# Patient Record
Sex: Male | Born: 1935
Health system: Southern US, Community
[De-identification: ages and names within clinical notes are randomized; demographics above are authoritative.]

## PROBLEM LIST (undated history)

## (undated) DIAGNOSIS — IMO0001 Reserved for inherently not codable concepts without codable children: Secondary | ICD-10-CM

## (undated) DIAGNOSIS — M1990 Unspecified osteoarthritis, unspecified site: Secondary | ICD-10-CM

## (undated) DIAGNOSIS — I447 Left bundle-branch block, unspecified: Secondary | ICD-10-CM

## (undated) DIAGNOSIS — R011 Cardiac murmur, unspecified: Secondary | ICD-10-CM

## (undated) DIAGNOSIS — F419 Anxiety disorder, unspecified: Secondary | ICD-10-CM

## (undated) DIAGNOSIS — K219 Gastro-esophageal reflux disease without esophagitis: Secondary | ICD-10-CM

## (undated) DIAGNOSIS — I2510 Atherosclerotic heart disease of native coronary artery without angina pectoris: Secondary | ICD-10-CM

## (undated) DIAGNOSIS — T4145XA Adverse effect of unspecified anesthetic, initial encounter: Secondary | ICD-10-CM

## (undated) DIAGNOSIS — M109 Gout, unspecified: Secondary | ICD-10-CM

## (undated) DIAGNOSIS — J189 Pneumonia, unspecified organism: Secondary | ICD-10-CM

## (undated) DIAGNOSIS — N186 End stage renal disease: Secondary | ICD-10-CM

## (undated) DIAGNOSIS — T8859XA Other complications of anesthesia, initial encounter: Secondary | ICD-10-CM

## (undated) DIAGNOSIS — B029 Zoster without complications: Secondary | ICD-10-CM

## (undated) DIAGNOSIS — I10 Essential (primary) hypertension: Secondary | ICD-10-CM

## (undated) DIAGNOSIS — Z992 Dependence on renal dialysis: Secondary | ICD-10-CM

## (undated) DIAGNOSIS — I509 Heart failure, unspecified: Secondary | ICD-10-CM

## (undated) HISTORY — DX: Gout, unspecified: M10.9

## (undated) HISTORY — PX: DIALYSIS FISTULA CREATION: SHX611

## (undated) HISTORY — DX: Essential (primary) hypertension: I10

## (undated) HISTORY — DX: Gastro-esophageal reflux disease without esophagitis: K21.9

## (undated) HISTORY — DX: Zoster without complications: B02.9

## (undated) HISTORY — PX: AV FISTULA PLACEMENT: SHX1204

---
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
## 2008-06-04 DIAGNOSIS — B029 Zoster without complications: Secondary | ICD-10-CM | POA: Insufficient documentation

## 2011-01-05 HISTORY — PX: COLONOSCOPY: SHX174

## 2011-02-02 DIAGNOSIS — N189 Chronic kidney disease, unspecified: Secondary | ICD-10-CM | POA: Diagnosis not present

## 2011-05-11 DIAGNOSIS — K219 Gastro-esophageal reflux disease without esophagitis: Secondary | ICD-10-CM | POA: Diagnosis not present

## 2011-05-11 DIAGNOSIS — N289 Disorder of kidney and ureter, unspecified: Secondary | ICD-10-CM | POA: Diagnosis not present

## 2011-05-11 DIAGNOSIS — I10 Essential (primary) hypertension: Secondary | ICD-10-CM | POA: Diagnosis not present

## 2011-06-03 DIAGNOSIS — R972 Elevated prostate specific antigen [PSA]: Secondary | ICD-10-CM | POA: Diagnosis not present

## 2011-06-03 DIAGNOSIS — R351 Nocturia: Secondary | ICD-10-CM | POA: Diagnosis not present

## 2011-08-30 DIAGNOSIS — L2089 Other atopic dermatitis: Secondary | ICD-10-CM | POA: Diagnosis not present

## 2011-08-30 DIAGNOSIS — L821 Other seborrheic keratosis: Secondary | ICD-10-CM | POA: Diagnosis not present

## 2011-08-30 DIAGNOSIS — D239 Other benign neoplasm of skin, unspecified: Secondary | ICD-10-CM | POA: Diagnosis not present

## 2011-08-30 DIAGNOSIS — L723 Sebaceous cyst: Secondary | ICD-10-CM | POA: Diagnosis not present

## 2011-08-31 DIAGNOSIS — N183 Chronic kidney disease, stage 3 unspecified: Secondary | ICD-10-CM | POA: Diagnosis not present

## 2011-09-02 DIAGNOSIS — N183 Chronic kidney disease, stage 3 unspecified: Secondary | ICD-10-CM | POA: Diagnosis not present

## 2011-09-16 DIAGNOSIS — N183 Chronic kidney disease, stage 3 unspecified: Secondary | ICD-10-CM | POA: Diagnosis not present

## 2011-09-23 DIAGNOSIS — H35369 Drusen (degenerative) of macula, unspecified eye: Secondary | ICD-10-CM | POA: Diagnosis not present

## 2011-09-23 DIAGNOSIS — H2510 Age-related nuclear cataract, unspecified eye: Secondary | ICD-10-CM | POA: Diagnosis not present

## 2011-09-23 DIAGNOSIS — H524 Presbyopia: Secondary | ICD-10-CM | POA: Diagnosis not present

## 2011-09-29 DIAGNOSIS — N184 Chronic kidney disease, stage 4 (severe): Secondary | ICD-10-CM | POA: Diagnosis not present

## 2011-09-30 DIAGNOSIS — N183 Chronic kidney disease, stage 3 unspecified: Secondary | ICD-10-CM | POA: Diagnosis not present

## 2011-10-04 DIAGNOSIS — Z23 Encounter for immunization: Secondary | ICD-10-CM | POA: Diagnosis not present

## 2011-10-06 DIAGNOSIS — I120 Hypertensive chronic kidney disease with stage 5 chronic kidney disease or end stage renal disease: Secondary | ICD-10-CM | POA: Diagnosis not present

## 2011-10-06 DIAGNOSIS — I498 Other specified cardiac arrhythmias: Secondary | ICD-10-CM | POA: Diagnosis not present

## 2011-10-06 DIAGNOSIS — N186 End stage renal disease: Secondary | ICD-10-CM | POA: Diagnosis not present

## 2011-10-06 DIAGNOSIS — Z01812 Encounter for preprocedural laboratory examination: Secondary | ICD-10-CM | POA: Diagnosis not present

## 2011-10-06 DIAGNOSIS — I459 Conduction disorder, unspecified: Secondary | ICD-10-CM | POA: Diagnosis not present

## 2011-10-06 DIAGNOSIS — Z01810 Encounter for preprocedural cardiovascular examination: Secondary | ICD-10-CM | POA: Diagnosis not present

## 2011-10-07 DIAGNOSIS — N183 Chronic kidney disease, stage 3 unspecified: Secondary | ICD-10-CM | POA: Diagnosis not present

## 2011-10-15 DIAGNOSIS — K219 Gastro-esophageal reflux disease without esophagitis: Secondary | ICD-10-CM | POA: Diagnosis not present

## 2011-10-15 DIAGNOSIS — I120 Hypertensive chronic kidney disease with stage 5 chronic kidney disease or end stage renal disease: Secondary | ICD-10-CM | POA: Diagnosis not present

## 2011-10-15 DIAGNOSIS — N189 Chronic kidney disease, unspecified: Secondary | ICD-10-CM | POA: Diagnosis not present

## 2011-10-15 DIAGNOSIS — M109 Gout, unspecified: Secondary | ICD-10-CM | POA: Diagnosis not present

## 2011-10-15 DIAGNOSIS — N186 End stage renal disease: Secondary | ICD-10-CM | POA: Diagnosis not present

## 2011-10-15 DIAGNOSIS — Z79899 Other long term (current) drug therapy: Secondary | ICD-10-CM | POA: Diagnosis not present

## 2011-10-15 DIAGNOSIS — Z7982 Long term (current) use of aspirin: Secondary | ICD-10-CM | POA: Diagnosis not present

## 2011-10-27 DIAGNOSIS — N184 Chronic kidney disease, stage 4 (severe): Secondary | ICD-10-CM | POA: Diagnosis not present

## 2011-10-27 DIAGNOSIS — N186 End stage renal disease: Secondary | ICD-10-CM | POA: Diagnosis not present

## 2011-11-01 DIAGNOSIS — N183 Chronic kidney disease, stage 3 unspecified: Secondary | ICD-10-CM | POA: Diagnosis not present

## 2011-12-13 DIAGNOSIS — R351 Nocturia: Secondary | ICD-10-CM | POA: Diagnosis not present

## 2011-12-13 DIAGNOSIS — N179 Acute kidney failure, unspecified: Secondary | ICD-10-CM | POA: Diagnosis not present

## 2011-12-13 DIAGNOSIS — R3129 Other microscopic hematuria: Secondary | ICD-10-CM | POA: Diagnosis not present

## 2011-12-13 DIAGNOSIS — N184 Chronic kidney disease, stage 4 (severe): Secondary | ICD-10-CM | POA: Diagnosis not present

## 2011-12-13 DIAGNOSIS — R972 Elevated prostate specific antigen [PSA]: Secondary | ICD-10-CM | POA: Diagnosis not present

## 2011-12-13 DIAGNOSIS — N529 Male erectile dysfunction, unspecified: Secondary | ICD-10-CM | POA: Diagnosis not present

## 2011-12-15 DIAGNOSIS — N289 Disorder of kidney and ureter, unspecified: Secondary | ICD-10-CM | POA: Diagnosis not present

## 2011-12-15 DIAGNOSIS — E7800 Pure hypercholesterolemia, unspecified: Secondary | ICD-10-CM | POA: Diagnosis not present

## 2011-12-15 DIAGNOSIS — Z23 Encounter for immunization: Secondary | ICD-10-CM | POA: Diagnosis not present

## 2011-12-15 DIAGNOSIS — I10 Essential (primary) hypertension: Secondary | ICD-10-CM | POA: Diagnosis not present

## 2011-12-17 DIAGNOSIS — N179 Acute kidney failure, unspecified: Secondary | ICD-10-CM | POA: Diagnosis not present

## 2011-12-17 DIAGNOSIS — N183 Chronic kidney disease, stage 3 unspecified: Secondary | ICD-10-CM | POA: Diagnosis not present

## 2011-12-20 DIAGNOSIS — E785 Hyperlipidemia, unspecified: Secondary | ICD-10-CM | POA: Diagnosis not present

## 2011-12-20 DIAGNOSIS — I10 Essential (primary) hypertension: Secondary | ICD-10-CM | POA: Diagnosis not present

## 2011-12-20 DIAGNOSIS — N184 Chronic kidney disease, stage 4 (severe): Secondary | ICD-10-CM | POA: Diagnosis not present

## 2011-12-31 DIAGNOSIS — H2510 Age-related nuclear cataract, unspecified eye: Secondary | ICD-10-CM | POA: Diagnosis not present

## 2012-01-05 HISTORY — PX: CATARACT EXTRACTION W/ INTRAOCULAR LENS IMPLANT: SHX1309

## 2012-01-13 DIAGNOSIS — H2510 Age-related nuclear cataract, unspecified eye: Secondary | ICD-10-CM | POA: Diagnosis not present

## 2012-01-19 DIAGNOSIS — H2510 Age-related nuclear cataract, unspecified eye: Secondary | ICD-10-CM | POA: Diagnosis not present

## 2012-01-19 DIAGNOSIS — H269 Unspecified cataract: Secondary | ICD-10-CM | POA: Diagnosis not present

## 2012-01-19 DIAGNOSIS — I119 Hypertensive heart disease without heart failure: Secondary | ICD-10-CM | POA: Diagnosis not present

## 2012-01-21 DIAGNOSIS — N184 Chronic kidney disease, stage 4 (severe): Secondary | ICD-10-CM | POA: Diagnosis not present

## 2012-01-21 DIAGNOSIS — N179 Acute kidney failure, unspecified: Secondary | ICD-10-CM | POA: Diagnosis not present

## 2012-01-27 DIAGNOSIS — H2510 Age-related nuclear cataract, unspecified eye: Secondary | ICD-10-CM | POA: Diagnosis not present

## 2012-01-27 DIAGNOSIS — N184 Chronic kidney disease, stage 4 (severe): Secondary | ICD-10-CM | POA: Diagnosis not present

## 2012-01-27 DIAGNOSIS — N179 Acute kidney failure, unspecified: Secondary | ICD-10-CM | POA: Diagnosis not present

## 2012-02-01 DIAGNOSIS — N179 Acute kidney failure, unspecified: Secondary | ICD-10-CM | POA: Diagnosis not present

## 2012-02-01 DIAGNOSIS — N184 Chronic kidney disease, stage 4 (severe): Secondary | ICD-10-CM | POA: Diagnosis not present

## 2012-02-02 DIAGNOSIS — I119 Hypertensive heart disease without heart failure: Secondary | ICD-10-CM | POA: Diagnosis not present

## 2012-02-02 DIAGNOSIS — H269 Unspecified cataract: Secondary | ICD-10-CM | POA: Diagnosis not present

## 2012-02-02 DIAGNOSIS — H2510 Age-related nuclear cataract, unspecified eye: Secondary | ICD-10-CM | POA: Diagnosis not present

## 2012-02-22 DIAGNOSIS — N184 Chronic kidney disease, stage 4 (severe): Secondary | ICD-10-CM | POA: Diagnosis not present

## 2012-02-22 DIAGNOSIS — N179 Acute kidney failure, unspecified: Secondary | ICD-10-CM | POA: Diagnosis not present

## 2012-02-25 DIAGNOSIS — N179 Acute kidney failure, unspecified: Secondary | ICD-10-CM | POA: Diagnosis not present

## 2012-02-25 DIAGNOSIS — N184 Chronic kidney disease, stage 4 (severe): Secondary | ICD-10-CM | POA: Diagnosis not present

## 2012-02-28 DIAGNOSIS — N184 Chronic kidney disease, stage 4 (severe): Secondary | ICD-10-CM | POA: Diagnosis not present

## 2012-03-27 DIAGNOSIS — N184 Chronic kidney disease, stage 4 (severe): Secondary | ICD-10-CM | POA: Diagnosis not present

## 2012-03-27 DIAGNOSIS — N179 Acute kidney failure, unspecified: Secondary | ICD-10-CM | POA: Diagnosis not present

## 2012-04-03 DIAGNOSIS — I10 Essential (primary) hypertension: Secondary | ICD-10-CM | POA: Diagnosis not present

## 2012-04-03 DIAGNOSIS — N179 Acute kidney failure, unspecified: Secondary | ICD-10-CM | POA: Diagnosis not present

## 2012-04-03 DIAGNOSIS — N2581 Secondary hyperparathyroidism of renal origin: Secondary | ICD-10-CM | POA: Diagnosis not present

## 2012-04-03 DIAGNOSIS — N184 Chronic kidney disease, stage 4 (severe): Secondary | ICD-10-CM | POA: Diagnosis not present

## 2012-04-04 DIAGNOSIS — B029 Zoster without complications: Secondary | ICD-10-CM

## 2012-04-04 HISTORY — DX: Zoster without complications: B02.9

## 2012-04-27 DIAGNOSIS — N289 Disorder of kidney and ureter, unspecified: Secondary | ICD-10-CM | POA: Diagnosis not present

## 2012-04-27 DIAGNOSIS — B029 Zoster without complications: Secondary | ICD-10-CM | POA: Diagnosis not present

## 2012-04-28 DIAGNOSIS — N184 Chronic kidney disease, stage 4 (severe): Secondary | ICD-10-CM | POA: Diagnosis not present

## 2012-04-28 DIAGNOSIS — N2581 Secondary hyperparathyroidism of renal origin: Secondary | ICD-10-CM | POA: Diagnosis not present

## 2012-04-28 DIAGNOSIS — I129 Hypertensive chronic kidney disease with stage 1 through stage 4 chronic kidney disease, or unspecified chronic kidney disease: Secondary | ICD-10-CM | POA: Diagnosis not present

## 2012-05-01 DIAGNOSIS — N184 Chronic kidney disease, stage 4 (severe): Secondary | ICD-10-CM | POA: Diagnosis not present

## 2012-05-01 DIAGNOSIS — N2581 Secondary hyperparathyroidism of renal origin: Secondary | ICD-10-CM | POA: Diagnosis not present

## 2012-05-01 DIAGNOSIS — I10 Essential (primary) hypertension: Secondary | ICD-10-CM | POA: Diagnosis not present

## 2012-05-23 DIAGNOSIS — B0229 Other postherpetic nervous system involvement: Secondary | ICD-10-CM | POA: Diagnosis not present

## 2012-05-31 DIAGNOSIS — I129 Hypertensive chronic kidney disease with stage 1 through stage 4 chronic kidney disease, or unspecified chronic kidney disease: Secondary | ICD-10-CM | POA: Diagnosis not present

## 2012-05-31 DIAGNOSIS — N184 Chronic kidney disease, stage 4 (severe): Secondary | ICD-10-CM | POA: Diagnosis not present

## 2012-06-01 DIAGNOSIS — I10 Essential (primary) hypertension: Secondary | ICD-10-CM | POA: Diagnosis not present

## 2012-06-01 DIAGNOSIS — N2581 Secondary hyperparathyroidism of renal origin: Secondary | ICD-10-CM | POA: Diagnosis not present

## 2012-06-01 DIAGNOSIS — N184 Chronic kidney disease, stage 4 (severe): Secondary | ICD-10-CM | POA: Diagnosis not present

## 2012-06-05 DIAGNOSIS — R351 Nocturia: Secondary | ICD-10-CM | POA: Diagnosis not present

## 2012-06-05 DIAGNOSIS — R972 Elevated prostate specific antigen [PSA]: Secondary | ICD-10-CM | POA: Diagnosis not present

## 2012-06-05 DIAGNOSIS — R3129 Other microscopic hematuria: Secondary | ICD-10-CM | POA: Diagnosis not present

## 2012-06-06 DIAGNOSIS — N185 Chronic kidney disease, stage 5: Secondary | ICD-10-CM | POA: Diagnosis not present

## 2012-07-03 DIAGNOSIS — J029 Acute pharyngitis, unspecified: Secondary | ICD-10-CM | POA: Diagnosis not present

## 2012-07-03 DIAGNOSIS — J309 Allergic rhinitis, unspecified: Secondary | ICD-10-CM | POA: Diagnosis not present

## 2012-07-03 DIAGNOSIS — R011 Cardiac murmur, unspecified: Secondary | ICD-10-CM | POA: Diagnosis not present

## 2012-07-05 DIAGNOSIS — N186 End stage renal disease: Secondary | ICD-10-CM | POA: Diagnosis not present

## 2012-07-05 DIAGNOSIS — T82898A Other specified complication of vascular prosthetic devices, implants and grafts, initial encounter: Secondary | ICD-10-CM | POA: Diagnosis not present

## 2012-07-10 DIAGNOSIS — N183 Chronic kidney disease, stage 3 unspecified: Secondary | ICD-10-CM | POA: Diagnosis not present

## 2012-07-21 DIAGNOSIS — I129 Hypertensive chronic kidney disease with stage 1 through stage 4 chronic kidney disease, or unspecified chronic kidney disease: Secondary | ICD-10-CM | POA: Diagnosis not present

## 2012-07-21 DIAGNOSIS — Z01818 Encounter for other preprocedural examination: Secondary | ICD-10-CM | POA: Diagnosis not present

## 2012-07-21 DIAGNOSIS — T82898A Other specified complication of vascular prosthetic devices, implants and grafts, initial encounter: Secondary | ICD-10-CM | POA: Diagnosis not present

## 2012-07-21 DIAGNOSIS — Z01812 Encounter for preprocedural laboratory examination: Secondary | ICD-10-CM | POA: Diagnosis not present

## 2012-07-21 DIAGNOSIS — N184 Chronic kidney disease, stage 4 (severe): Secondary | ICD-10-CM | POA: Diagnosis not present

## 2012-07-21 DIAGNOSIS — Z01810 Encounter for preprocedural cardiovascular examination: Secondary | ICD-10-CM | POA: Diagnosis not present

## 2012-07-21 DIAGNOSIS — Z01811 Encounter for preprocedural respiratory examination: Secondary | ICD-10-CM | POA: Diagnosis not present

## 2012-08-04 DIAGNOSIS — I498 Other specified cardiac arrhythmias: Secondary | ICD-10-CM | POA: Diagnosis present

## 2012-08-04 DIAGNOSIS — N186 End stage renal disease: Secondary | ICD-10-CM | POA: Diagnosis not present

## 2012-08-04 DIAGNOSIS — Z5309 Procedure and treatment not carried out because of other contraindication: Secondary | ICD-10-CM | POA: Diagnosis not present

## 2012-08-04 DIAGNOSIS — D649 Anemia, unspecified: Secondary | ICD-10-CM | POA: Diagnosis present

## 2012-08-04 DIAGNOSIS — I455 Other specified heart block: Secondary | ICD-10-CM | POA: Diagnosis present

## 2012-08-04 DIAGNOSIS — I129 Hypertensive chronic kidney disease with stage 1 through stage 4 chronic kidney disease, or unspecified chronic kidney disease: Secondary | ICD-10-CM | POA: Diagnosis present

## 2012-08-04 DIAGNOSIS — T82898A Other specified complication of vascular prosthetic devices, implants and grafts, initial encounter: Secondary | ICD-10-CM | POA: Diagnosis present

## 2012-08-04 DIAGNOSIS — I495 Sick sinus syndrome: Secondary | ICD-10-CM | POA: Diagnosis not present

## 2012-08-04 DIAGNOSIS — R079 Chest pain, unspecified: Secondary | ICD-10-CM | POA: Diagnosis not present

## 2012-08-04 DIAGNOSIS — I459 Conduction disorder, unspecified: Secondary | ICD-10-CM | POA: Diagnosis not present

## 2012-08-04 DIAGNOSIS — I469 Cardiac arrest, cause unspecified: Secondary | ICD-10-CM | POA: Diagnosis present

## 2012-08-04 DIAGNOSIS — M109 Gout, unspecified: Secondary | ICD-10-CM | POA: Diagnosis present

## 2012-08-04 DIAGNOSIS — N184 Chronic kidney disease, stage 4 (severe): Secondary | ICD-10-CM | POA: Diagnosis present

## 2012-08-04 DIAGNOSIS — I359 Nonrheumatic aortic valve disorder, unspecified: Secondary | ICD-10-CM | POA: Diagnosis not present

## 2012-08-10 DIAGNOSIS — R7989 Other specified abnormal findings of blood chemistry: Secondary | ICD-10-CM | POA: Diagnosis not present

## 2012-08-10 DIAGNOSIS — E211 Secondary hyperparathyroidism, not elsewhere classified: Secondary | ICD-10-CM | POA: Diagnosis not present

## 2012-08-10 DIAGNOSIS — N184 Chronic kidney disease, stage 4 (severe): Secondary | ICD-10-CM | POA: Diagnosis not present

## 2012-08-10 DIAGNOSIS — I10 Essential (primary) hypertension: Secondary | ICD-10-CM | POA: Diagnosis not present

## 2012-08-11 DIAGNOSIS — Z7982 Long term (current) use of aspirin: Secondary | ICD-10-CM | POA: Diagnosis not present

## 2012-08-11 DIAGNOSIS — N186 End stage renal disease: Secondary | ICD-10-CM | POA: Diagnosis not present

## 2012-08-11 DIAGNOSIS — I120 Hypertensive chronic kidney disease with stage 5 chronic kidney disease or end stage renal disease: Secondary | ICD-10-CM | POA: Diagnosis not present

## 2012-08-11 DIAGNOSIS — Z79899 Other long term (current) drug therapy: Secondary | ICD-10-CM | POA: Diagnosis not present

## 2012-08-11 DIAGNOSIS — T82598A Other mechanical complication of other cardiac and vascular devices and implants, initial encounter: Secondary | ICD-10-CM | POA: Diagnosis not present

## 2012-08-11 DIAGNOSIS — M109 Gout, unspecified: Secondary | ICD-10-CM | POA: Diagnosis not present

## 2012-08-22 DIAGNOSIS — L821 Other seborrheic keratosis: Secondary | ICD-10-CM | POA: Diagnosis not present

## 2012-08-22 DIAGNOSIS — L2089 Other atopic dermatitis: Secondary | ICD-10-CM | POA: Diagnosis not present

## 2012-08-22 DIAGNOSIS — L723 Sebaceous cyst: Secondary | ICD-10-CM | POA: Diagnosis not present

## 2012-08-22 DIAGNOSIS — D239 Other benign neoplasm of skin, unspecified: Secondary | ICD-10-CM | POA: Diagnosis not present

## 2012-09-11 DIAGNOSIS — N184 Chronic kidney disease, stage 4 (severe): Secondary | ICD-10-CM | POA: Diagnosis not present

## 2012-09-19 DIAGNOSIS — R7989 Other specified abnormal findings of blood chemistry: Secondary | ICD-10-CM | POA: Diagnosis not present

## 2012-09-19 DIAGNOSIS — I10 Essential (primary) hypertension: Secondary | ICD-10-CM | POA: Diagnosis not present

## 2012-09-19 DIAGNOSIS — E211 Secondary hyperparathyroidism, not elsewhere classified: Secondary | ICD-10-CM | POA: Diagnosis not present

## 2012-09-19 DIAGNOSIS — N184 Chronic kidney disease, stage 4 (severe): Secondary | ICD-10-CM | POA: Diagnosis not present

## 2012-09-26 DIAGNOSIS — Z23 Encounter for immunization: Secondary | ICD-10-CM | POA: Diagnosis not present

## 2012-10-18 DIAGNOSIS — N184 Chronic kidney disease, stage 4 (severe): Secondary | ICD-10-CM | POA: Diagnosis not present

## 2012-10-27 DIAGNOSIS — R7989 Other specified abnormal findings of blood chemistry: Secondary | ICD-10-CM | POA: Diagnosis not present

## 2012-10-27 DIAGNOSIS — N184 Chronic kidney disease, stage 4 (severe): Secondary | ICD-10-CM | POA: Diagnosis not present

## 2012-10-27 DIAGNOSIS — E211 Secondary hyperparathyroidism, not elsewhere classified: Secondary | ICD-10-CM | POA: Diagnosis not present

## 2012-10-27 DIAGNOSIS — I10 Essential (primary) hypertension: Secondary | ICD-10-CM | POA: Diagnosis not present

## 2012-12-11 DIAGNOSIS — R972 Elevated prostate specific antigen [PSA]: Secondary | ICD-10-CM | POA: Diagnosis not present

## 2012-12-11 DIAGNOSIS — N529 Male erectile dysfunction, unspecified: Secondary | ICD-10-CM | POA: Diagnosis not present

## 2012-12-11 DIAGNOSIS — R351 Nocturia: Secondary | ICD-10-CM | POA: Diagnosis not present

## 2012-12-11 DIAGNOSIS — R3129 Other microscopic hematuria: Secondary | ICD-10-CM | POA: Diagnosis not present

## 2012-12-12 DIAGNOSIS — N2581 Secondary hyperparathyroidism of renal origin: Secondary | ICD-10-CM | POA: Diagnosis not present

## 2012-12-12 DIAGNOSIS — N184 Chronic kidney disease, stage 4 (severe): Secondary | ICD-10-CM | POA: Diagnosis not present

## 2012-12-12 DIAGNOSIS — I129 Hypertensive chronic kidney disease with stage 1 through stage 4 chronic kidney disease, or unspecified chronic kidney disease: Secondary | ICD-10-CM | POA: Diagnosis not present

## 2012-12-14 DIAGNOSIS — I10 Essential (primary) hypertension: Secondary | ICD-10-CM | POA: Diagnosis not present

## 2012-12-14 DIAGNOSIS — N2581 Secondary hyperparathyroidism of renal origin: Secondary | ICD-10-CM | POA: Diagnosis not present

## 2012-12-20 DIAGNOSIS — E7800 Pure hypercholesterolemia, unspecified: Secondary | ICD-10-CM | POA: Diagnosis not present

## 2012-12-20 DIAGNOSIS — R972 Elevated prostate specific antigen [PSA]: Secondary | ICD-10-CM | POA: Diagnosis not present

## 2012-12-20 DIAGNOSIS — I10 Essential (primary) hypertension: Secondary | ICD-10-CM | POA: Diagnosis not present

## 2012-12-20 DIAGNOSIS — N19 Unspecified kidney failure: Secondary | ICD-10-CM | POA: Diagnosis not present

## 2012-12-20 DIAGNOSIS — Z79899 Other long term (current) drug therapy: Secondary | ICD-10-CM | POA: Diagnosis not present

## 2013-02-07 DIAGNOSIS — D571 Sickle-cell disease without crisis: Secondary | ICD-10-CM | POA: Diagnosis not present

## 2013-02-07 DIAGNOSIS — I120 Hypertensive chronic kidney disease with stage 5 chronic kidney disease or end stage renal disease: Secondary | ICD-10-CM | POA: Diagnosis not present

## 2013-02-07 DIAGNOSIS — N185 Chronic kidney disease, stage 5: Secondary | ICD-10-CM | POA: Diagnosis not present

## 2013-02-07 DIAGNOSIS — Z79899 Other long term (current) drug therapy: Secondary | ICD-10-CM | POA: Diagnosis not present

## 2013-02-07 LAB — LIPID PANEL
Cholesterol: 117 mg/dL (ref 0–200)
HDL: 36 mg/dL (ref 35–70)
LDL CALC: 55 mg/dL
TRIGLYCERIDES: 129 mg/dL (ref 40–160)

## 2013-02-09 DIAGNOSIS — I129 Hypertensive chronic kidney disease with stage 1 through stage 4 chronic kidney disease, or unspecified chronic kidney disease: Secondary | ICD-10-CM | POA: Diagnosis not present

## 2013-02-09 DIAGNOSIS — N184 Chronic kidney disease, stage 4 (severe): Secondary | ICD-10-CM | POA: Diagnosis not present

## 2013-02-14 DIAGNOSIS — N2581 Secondary hyperparathyroidism of renal origin: Secondary | ICD-10-CM | POA: Diagnosis not present

## 2013-02-14 DIAGNOSIS — N185 Chronic kidney disease, stage 5: Secondary | ICD-10-CM | POA: Diagnosis not present

## 2013-02-14 DIAGNOSIS — I10 Essential (primary) hypertension: Secondary | ICD-10-CM | POA: Diagnosis not present

## 2013-02-26 DIAGNOSIS — I120 Hypertensive chronic kidney disease with stage 5 chronic kidney disease or end stage renal disease: Secondary | ICD-10-CM | POA: Diagnosis not present

## 2013-02-26 DIAGNOSIS — N185 Chronic kidney disease, stage 5: Secondary | ICD-10-CM | POA: Diagnosis not present

## 2013-03-01 DIAGNOSIS — I10 Essential (primary) hypertension: Secondary | ICD-10-CM | POA: Diagnosis not present

## 2013-03-01 DIAGNOSIS — N2581 Secondary hyperparathyroidism of renal origin: Secondary | ICD-10-CM | POA: Diagnosis not present

## 2013-03-01 DIAGNOSIS — N185 Chronic kidney disease, stage 5: Secondary | ICD-10-CM | POA: Diagnosis not present

## 2013-03-30 DIAGNOSIS — N185 Chronic kidney disease, stage 5: Secondary | ICD-10-CM | POA: Diagnosis not present

## 2013-03-30 DIAGNOSIS — I120 Hypertensive chronic kidney disease with stage 5 chronic kidney disease or end stage renal disease: Secondary | ICD-10-CM | POA: Diagnosis not present

## 2013-03-30 LAB — CBC AND DIFFERENTIAL
HCT: 32 % — AB (ref 41–53)
HEMOGLOBIN: 10.8 g/dL — AB (ref 13.5–17.5)
Platelets: 203 10*3/uL (ref 150–399)
WBC: 7.5 10*3/mL

## 2013-03-30 LAB — BASIC METABOLIC PANEL
BUN: 60 mg/dL — AB (ref 4–21)
CREATININE: 5.7 mg/dL — AB (ref 0.6–1.3)
GLUCOSE: 79 mg/dL
Potassium: 4.1 mmol/L (ref 3.4–5.3)
SODIUM: 141 mmol/L (ref 137–147)

## 2013-03-30 LAB — HEPATIC FUNCTION PANEL
ALT: 16 U/L (ref 10–40)
AST: 8 U/L — AB (ref 14–40)

## 2013-04-06 DIAGNOSIS — N2581 Secondary hyperparathyroidism of renal origin: Secondary | ICD-10-CM | POA: Diagnosis not present

## 2013-04-06 DIAGNOSIS — N185 Chronic kidney disease, stage 5: Secondary | ICD-10-CM | POA: Diagnosis not present

## 2013-04-06 DIAGNOSIS — I10 Essential (primary) hypertension: Secondary | ICD-10-CM | POA: Diagnosis not present

## 2013-05-16 ENCOUNTER — Non-Acute Institutional Stay: Payer: Medicare Other | Admitting: Geriatric Medicine

## 2013-05-16 ENCOUNTER — Encounter: Payer: Self-pay | Admitting: Geriatric Medicine

## 2013-05-16 VITALS — BP 102/60 | HR 76 | Temp 97.8°F | Ht 69.0 in | Wt 168.0 lb

## 2013-05-16 DIAGNOSIS — K219 Gastro-esophageal reflux disease without esophagitis: Secondary | ICD-10-CM

## 2013-05-16 DIAGNOSIS — I10 Essential (primary) hypertension: Secondary | ICD-10-CM

## 2013-05-16 DIAGNOSIS — B9789 Other viral agents as the cause of diseases classified elsewhere: Secondary | ICD-10-CM

## 2013-05-16 DIAGNOSIS — J988 Other specified respiratory disorders: Secondary | ICD-10-CM

## 2013-05-16 DIAGNOSIS — N185 Chronic kidney disease, stage 5: Secondary | ICD-10-CM | POA: Diagnosis not present

## 2013-05-16 NOTE — Progress Notes (Signed)
Patient ID: Eric [name], male   DOB: 1930-03-31, 80 y.o.   MRN: [id number]   [facility] [phone])  Code Status: Living Will, HCPOA Contact Information   Name Relation Home Work Mobile   No,Contact  [phone]         Chief Complaint  Patient presents with  . Medical Management of Chronic Issues    New Patient just moved to [facility] May 1st  . Cough    started Sunday 05/13/13. Stayed in bed, had shakes, "feels terrible".   HPI:  This is an 80 year old male resident of [facility], Independent Living section. He and his wife just moved to [place] 05/04/2013. He presents to clinic today to establish care with [facility] and to arrange for a nephrology consult. In the last several days he has developed an acute illness including a cough and severe fatigue. He was visiting family in [place] this past weekend, grandchildren and son-in-law were ill. This patient's been followed for many years with failing renal function, has had dialysis fistula placed in his arm twice; once in 2013 then again in August 2014. He has not required hemodialysis up to this point. Most recent labs done in [place] in March 2015 showed  BUN of 60, creatinine 5.7 and GFR of 10. Patient's other medical issues include blood pressure which has been well controlled with amlodipine. He has GERD also well controlled with PPI.    No Known Allergies  MEDICATIONS -     Medication List       This list is accurate as of: 05/16/13 11:59 PM.  Always use your most recent med list.               allopurinol 100 MG tablet  Commonly known as:  ZYLOPRIM  Take 100 mg by mouth daily.     amLODipine 10 MG tablet  Commonly known as:  NORVASC  Take 10 mg by mouth daily.     aspirin 81 MG tablet  Take 81 mg by mouth daily.     atorvastatin 20 MG tablet  Commonly known as:  LIPITOR  Take 20 mg by mouth daily.     calcitRIOL 0.25 MCG capsule  Commonly  known as:  ROCALTROL  Take 0.25 mcg by mouth daily.     omeprazole 20 MG capsule  Commonly known as:  PRILOSEC  Take 20 mg by mouth. Take one tablet four times a week     Vitamin D (Ergocalciferol) 50000 UNITS Caps capsule  Commonly known as:  DRISDOL  Take 50,000 Units by mouth. Take one every other week     Vitamin D 2000 UNITS tablet  Take 2,000 Units by mouth daily.         DATA REVIEWED  Radiologic Exams:   Cardiovascular Exams:   Laboratory Studies: Lab Results  Component Value Date   WBC 7.5 03/30/2013   HGB 10.8* 03/30/2013   HCT 32* 03/30/2013   PLT 203 03/30/2013   Lab Results  Component Value Date   NA 141 03/30/2013   K 4.1 03/30/2013   GLU 79 03/30/2013   BUN 60* 03/30/2013   CREATININE 5.7* 03/30/2013    Lab Results  Component Value Date   ALT 16 03/30/2013   AST 8* 03/30/2013   Albumin 3.8      03/30/2013  Lab Results  Component Value Date   CHOL 117 02/07/2013   HDL 36 02/07/2013   LDLCALC 55 02/07/2013   TRIG 129  02/07/2013    PTH  123      02/07/2013  Vitamin D 16      02/07/2013        Past Medical History  Diagnosis Date  . Chronic kidney disease (CKD), stage IV (severe)   . Unspecified essential hypertension   . GERD (gastroesophageal reflux disease)   . Gout   . Herpes zoster 04/2012   Past Surgical History  Procedure Laterality Date  . Appendectomy  1944  . Cataract extraction w/ intraocular lens implant Bilateral 2014    [place]  . Dialysis fistula creation  08/04/2012 and 10/13    Dr. Harden Mo  . Colonoscopy  2013    Dr. Annamaria Helling [place], [place].   Family Status  Relation Status Death Age  . Mother Deceased 45  . Father Deceased 88  . Brother Alive   . Daughter Alive   . Son Alive   . Daughter Alive    History   Social History Narrative   Patient is Married since 1958. Occupation: Licensed conveyancer   Lives in apartment,  Independent Living  section at [place] since 05/04/2013. Also has a home in  [place], [place].   No Smoking history  Alcohol history: 5 drinks/ week   Regular exercise: 3-4 times a week free weights, treadmill   Patient has Advanced planning documents: Living Will, HCPOA              REVIEW OF SYSTEMS  DATA OBTAINED: from patient,, medical record,  family member GENERAL: Does not feel well, 'exhausted' No fever. Decreased appetite   SKIN: No itch, rash or open wounds EYES: No eye pain, dryness or itching  No change in vision EARS: No earache, change in hearing. Felt like "water in ear" yesterday better today NOSE: No congestion, drainage or bleeding MOUTH/THROAT: No mouth or tooth pain  Mild sore throat   No difficulty chewing or swallowing RESPIRATORY: Productive cough, No wheezing, SOB CARDIAC: No chest pain, palpitations  No edema. GI: No abdominal pain  No nausea, vomiting,diarrhea. Irregular BM since last year  No heartburn or reflux  GU: No dysuria, voids regularly  No change in urine volume or character MUSCULOSKELETAL: No joint pain, swelling or stiffness  No back pain  No muscle ache, pain, weakness  Gait is steady  No recent falls.  NEUROLOGIC: No dizziness, fainting, headache, No change in mental status.  PSYCHIATRIC: No feelings of anxiety, depression  Sleeps well.   Moving has been stressful  PHYSICAL EXAM Filed Vitals:   05/16/13 1453  BP: 102/60  Pulse: 76  Temp: 97.8 F (36.6 C)  TempSrc: Oral  Height: 5\' 9"  (1.753 m)  Weight: 168 lb (76.204 kg)  SpO2: 99%   Body mass index is 24.8 kg/(m^2).  GENERAL APPEARANCE: No acute distress, appropriately groomed, normal body habitus. Alert, pleasant, conversant. SKIN: No diaphoresis, rash, unusual lesions, wounds HEAD: Normocephalic, atraumatic EYES: Conjunctiva/lids clear. Pupils round, reactive. EOMs intact.  EARS: External exam WNL, canals clear, TM WNL. Hearing grossly normal. NOSE: No deformity or discharge. MOUTH/THROAT: Lips w/o lesions. Oral mucosa, tongue moist, w/o lesion.  Oropharynx w/o redness or lesions.  NECK: Supple, full ROM. No thyroid tenderness, enlargement or nodule LYMPHATICS: No head, neck or supraclavicular adenopathy RESPIRATORY: Breathing is even, unlabored. Lung sounds are clear and full. Cough productive of yellow sputum CARDIOVASCULAR: Heart RRR. No murmur or extra heart sounds  ARTERIAL: No carotid or femoral bruit. Carotid, Femoral, DP,PT pulse 2+.  Left  wrist AVF w/thrill  VENOUS: No varicosities. No venous stasis skin changes  EDEMA: No peripheral edema. No ascites GASTROINTESTINAL: Abdomen is soft, non-tender, not distended w/ normal bowel sounds. No hepatic or splenic enlargement. No mass, ventral or inguinal hernia. MUSCULOSKELETAL: Moves all extremities with full ROM, strength and tone. Back is without kyphosis, scoliosis or spinal process tenderness. Gait is slow, steady NEUROLOGIC: Oriented to time, place, person. Cranial nerves 2-12 grossly intact, speech clear, no tremor. PSYCHIATRIC: Mood and affect appropriate to situation   ASSESSMENT/PLAN  CKD (chronic kidney disease) stage 5, GFR less than 18 ml/min 80 year old man new to this area with progressive chronic q.d. disease, now stage V. Most recent lab March 2015 GFR of 12. The patient has a mature left wrist AV fistula .  Will repeat labs tomorrow. Recommend nephrology consultation as soon as possible.   Viral respiratory illness The patient's symptoms of cough mild sore throat and fatigue consistent with viral respiratory illness. Commend fluids and rest. He does family have been advised to return to clinic if he develops a fever, worsening cough or develops shortness of breath  Unspecified essential hypertension Blood pressure a bit low today, patient reports usually runs around 140/60. Continue current medication  GERD (gastroesophageal reflux disease) Asymptomatic with current medication    Family/ staff Communication:  Extensive discussion with patient, daughter, and  spouse regarding need for urgent nephrology referral, they understand and agree. The patient also asked about DO NOT RESUSCITATE order. Discussion regarding CPR risks and benefits were discussed. Patient will think this over for making decision  Goals of care:   Maximize quality-of-life   Labs/tests ordered: CBC, CMP   Follow up: Return in about 3 weeks (around 06/04/2013) for F/U new patietn visit.  Mardene Celeste, NP-C [place] [phone]  05/16/2013

## 2013-05-17 ENCOUNTER — Encounter: Payer: Self-pay | Admitting: Geriatric Medicine

## 2013-05-17 DIAGNOSIS — J988 Other specified respiratory disorders: Secondary | ICD-10-CM | POA: Insufficient documentation

## 2013-05-17 DIAGNOSIS — N185 Chronic kidney disease, stage 5: Secondary | ICD-10-CM | POA: Insufficient documentation

## 2013-05-17 DIAGNOSIS — I10 Essential (primary) hypertension: Secondary | ICD-10-CM | POA: Diagnosis not present

## 2013-05-17 DIAGNOSIS — N184 Chronic kidney disease, stage 4 (severe): Secondary | ICD-10-CM | POA: Diagnosis not present

## 2013-05-17 DIAGNOSIS — B9789 Other viral agents as the cause of diseases classified elsewhere: Secondary | ICD-10-CM | POA: Insufficient documentation

## 2013-05-17 DIAGNOSIS — K219 Gastro-esophageal reflux disease without esophagitis: Secondary | ICD-10-CM | POA: Insufficient documentation

## 2013-05-17 LAB — CBC AND DIFFERENTIAL
HCT: 28 % — AB (ref 41–53)
Hemoglobin: 9.9 g/dL — AB (ref 13.5–17.5)
Platelets: 147 10*3/uL — AB (ref 150–399)
WBC: 4.9 10^3/mL

## 2013-05-17 LAB — HEPATIC FUNCTION PANEL
ALK PHOS: 50 U/L (ref 25–125)
ALT: 19 U/L (ref 10–40)
AST: 24 U/L (ref 14–40)

## 2013-05-17 LAB — BASIC METABOLIC PANEL
BUN: 98 mg/dL — AB (ref 4–21)
Creatinine: 7.9 mg/dL — AB (ref 0.6–1.3)
Glucose: 95 mg/dL
POTASSIUM: 4 mmol/L (ref 3.4–5.3)
Sodium: 134 mmol/L — AB (ref 137–147)

## 2013-05-17 NOTE — Assessment & Plan Note (Signed)
80 year old man new to this area with progressive chronic q.d. disease, now stage V. Most recent lab March 2015 GFR of 12. The patient has a mature left wrist AV fistula .  Will repeat labs tomorrow. Recommend nephrology consultation as soon as possible.

## 2013-05-17 NOTE — Assessment & Plan Note (Signed)
Asymptomatic with current medication

## 2013-05-17 NOTE — Assessment & Plan Note (Signed)
Blood pressure a bit low today, patient reports usually runs around 140/60. Continue current medication

## 2013-05-17 NOTE — Assessment & Plan Note (Signed)
The patient's symptoms of cough mild sore throat and fatigue consistent with viral respiratory illness. Commend fluids and rest. He does family have been advised to return to clinic if he develops a fever, worsening cough or develops shortness of breath

## 2013-05-21 DIAGNOSIS — M109 Gout, unspecified: Secondary | ICD-10-CM | POA: Diagnosis not present

## 2013-05-21 DIAGNOSIS — N185 Chronic kidney disease, stage 5: Secondary | ICD-10-CM | POA: Diagnosis not present

## 2013-05-21 DIAGNOSIS — I129 Hypertensive chronic kidney disease with stage 1 through stage 4 chronic kidney disease, or unspecified chronic kidney disease: Secondary | ICD-10-CM | POA: Diagnosis not present

## 2013-05-21 DIAGNOSIS — N2581 Secondary hyperparathyroidism of renal origin: Secondary | ICD-10-CM | POA: Diagnosis not present

## 2013-05-21 DIAGNOSIS — N039 Chronic nephritic syndrome with unspecified morphologic changes: Secondary | ICD-10-CM | POA: Diagnosis not present

## 2013-05-21 DIAGNOSIS — D631 Anemia in chronic kidney disease: Secondary | ICD-10-CM | POA: Diagnosis not present

## 2013-05-26 DIAGNOSIS — Z23 Encounter for immunization: Secondary | ICD-10-CM | POA: Diagnosis not present

## 2013-05-26 DIAGNOSIS — D509 Iron deficiency anemia, unspecified: Secondary | ICD-10-CM | POA: Diagnosis not present

## 2013-05-26 DIAGNOSIS — N2581 Secondary hyperparathyroidism of renal origin: Secondary | ICD-10-CM | POA: Diagnosis not present

## 2013-05-26 DIAGNOSIS — N186 End stage renal disease: Secondary | ICD-10-CM | POA: Diagnosis not present

## 2013-05-26 DIAGNOSIS — D631 Anemia in chronic kidney disease: Secondary | ICD-10-CM | POA: Diagnosis not present

## 2013-05-29 DIAGNOSIS — N2581 Secondary hyperparathyroidism of renal origin: Secondary | ICD-10-CM | POA: Diagnosis not present

## 2013-05-29 DIAGNOSIS — D631 Anemia in chronic kidney disease: Secondary | ICD-10-CM | POA: Diagnosis not present

## 2013-05-29 DIAGNOSIS — N186 End stage renal disease: Secondary | ICD-10-CM | POA: Diagnosis not present

## 2013-05-29 DIAGNOSIS — Z23 Encounter for immunization: Secondary | ICD-10-CM | POA: Diagnosis not present

## 2013-05-29 DIAGNOSIS — D509 Iron deficiency anemia, unspecified: Secondary | ICD-10-CM | POA: Diagnosis not present

## 2013-05-31 DIAGNOSIS — Z23 Encounter for immunization: Secondary | ICD-10-CM | POA: Diagnosis not present

## 2013-05-31 DIAGNOSIS — N186 End stage renal disease: Secondary | ICD-10-CM | POA: Diagnosis not present

## 2013-05-31 DIAGNOSIS — N2581 Secondary hyperparathyroidism of renal origin: Secondary | ICD-10-CM | POA: Diagnosis not present

## 2013-05-31 DIAGNOSIS — D509 Iron deficiency anemia, unspecified: Secondary | ICD-10-CM | POA: Diagnosis not present

## 2013-05-31 DIAGNOSIS — D631 Anemia in chronic kidney disease: Secondary | ICD-10-CM | POA: Diagnosis not present

## 2013-06-02 DIAGNOSIS — N2581 Secondary hyperparathyroidism of renal origin: Secondary | ICD-10-CM | POA: Diagnosis not present

## 2013-06-02 DIAGNOSIS — N186 End stage renal disease: Secondary | ICD-10-CM | POA: Diagnosis not present

## 2013-06-02 DIAGNOSIS — D631 Anemia in chronic kidney disease: Secondary | ICD-10-CM | POA: Diagnosis not present

## 2013-06-02 DIAGNOSIS — Z23 Encounter for immunization: Secondary | ICD-10-CM | POA: Diagnosis not present

## 2013-06-02 DIAGNOSIS — N039 Chronic nephritic syndrome with unspecified morphologic changes: Secondary | ICD-10-CM | POA: Diagnosis not present

## 2013-06-02 DIAGNOSIS — D509 Iron deficiency anemia, unspecified: Secondary | ICD-10-CM | POA: Diagnosis not present

## 2013-06-04 ENCOUNTER — Non-Acute Institutional Stay: Payer: Medicare Other | Admitting: Internal Medicine

## 2013-06-04 ENCOUNTER — Encounter: Payer: Self-pay | Admitting: Internal Medicine

## 2013-06-04 VITALS — BP 140/70 | HR 76 | Temp 97.9°F | Wt 167.0 lb

## 2013-06-04 DIAGNOSIS — B029 Zoster without complications: Secondary | ICD-10-CM

## 2013-06-04 DIAGNOSIS — M109 Gout, unspecified: Secondary | ICD-10-CM | POA: Diagnosis not present

## 2013-06-04 DIAGNOSIS — N186 End stage renal disease: Secondary | ICD-10-CM | POA: Diagnosis not present

## 2013-06-04 DIAGNOSIS — R05 Cough: Secondary | ICD-10-CM

## 2013-06-04 DIAGNOSIS — Z7982 Long term (current) use of aspirin: Secondary | ICD-10-CM | POA: Diagnosis not present

## 2013-06-04 DIAGNOSIS — Z992 Dependence on renal dialysis: Secondary | ICD-10-CM

## 2013-06-04 DIAGNOSIS — R059 Cough, unspecified: Secondary | ICD-10-CM

## 2013-06-04 DIAGNOSIS — I10 Essential (primary) hypertension: Secondary | ICD-10-CM | POA: Diagnosis not present

## 2013-06-04 DIAGNOSIS — T887XXA Unspecified adverse effect of drug or medicament, initial encounter: Secondary | ICD-10-CM | POA: Diagnosis not present

## 2013-06-04 NOTE — Progress Notes (Signed)
Patient ID: Eric [name], male   DOB: [birth year], 80 y.o.   MRN: [id number]    Location:  [facility] (12)  PCP: Estill Dooms, MD  Code Status: LIVING WILL, HCPOA  Extended Emergency Contact Information Primary Emergency Contact: Georga Bora [place] Phone: [phone] Relation: Spouse Secondary Emergency Contact: [name],Ilene Address: [address]          [place], [place] [address] [place] Work Phone: [phone] Mobile Phone: [phone] Relation: Daughter  No Known Allergies  Chief Complaint  Patient presents with  . Medical Management of Chronic Issues    new patient follow-up, est with [facility] 05/16/13. [facility], GERD, blood pressure  . Cough    "still has cough"    HPI:   Cough: Present since May 13, 2013  Gout: no recent attacks. using allopurinol.  Herpes zoster: remote history. No residual problems.  ESRD on dialysis: started 2 weeks ago.   Past Medical History  Diagnosis Date  . CKD (chronic kidney disease) stage 5, GFR less than 15 ml/min   . Unspecified essential hypertension   . GERD (gastroesophageal reflux disease)   . Gout   . Herpes zoster 04/2012    Past Surgical History  Procedure Laterality Date  . Appendectomy  1944  . Cataract extraction w/ intraocular lens implant Bilateral 2014    [place]  . Dialysis fistula creation  08/04/2012 and 10/13    Dr. Harden Mo  . Colonoscopy  2013    Dr. Irene Lim[place], [place].    CONSULTANTS Neph: [place]  Social History: History   Social History  . Marital Status: Married    Spouse Name: N/A    Number of Children: N/A  . Years of Education: 16   Occupational History  . retired Designer, fashion/clothing    Social History Main Topics  . Smoking status: Never Smoker   . Smokeless tobacco: Never Used  . Alcohol Use: Yes     Comment: 5   . Drug Use: No  . Sexual Activity: None   Other Topics  Concern  . None   Social History Narrative   Patient is Married since 1958. Occupation: Licensed conveyancer   Lives in apartment,  Independent Living  section at [place] since 05/04/2013. Also has a home in [place], [place].   No Smoking history  Alcohol history: 5 drinks/ week   Regular exercise: 3-4 times a week free weights, treadmill   Patient has Advanced planning documents: Living Will, HCPOA             Family History Family Status  Relation Status Death Age  . Mother Deceased 32  . Father Deceased 11  . Brother Alive   . Daughter Alive   . Son Alive   . Daughter Alive    Family History  Problem Relation Age of Onset  . Cancer Father     lung     Medications: Patient's Medications  New Prescriptions   No medications on file  Previous Medications   ALLOPURINOL (ZYLOPRIM) 100 MG TABLET    Take 100 mg by mouth daily.   AMLODIPINE (NORVASC) 10 MG TABLET    Take 10 mg by mouth daily.   ASPIRIN 81 MG TABLET    Take 81 mg by mouth daily.   ATORVASTATIN (LIPITOR) 20 MG TABLET    Take 20 mg by mouth daily.   MULTIVITAMIN (RENA-VIT) TABS TABLET  Take 1 tablet by mouth daily. Take one tablet daily   OMEPRAZOLE (PRILOSEC) 20 MG CAPSULE    Take 20 mg by mouth. Take one tablet four times a week   VITAMIN D, ERGOCALCIFEROL, (DRISDOL) 50000 UNITS CAPS CAPSULE    Take 50,000 Units by mouth. Take one every other week  Modified Medications   No medications on file  Discontinued Medications   CALCITRIOL (ROCALTROL) 0.25 MCG CAPSULE    Take 0.25 mcg by mouth daily.   CHOLECALCIFEROL (VITAMIN D) 2000 UNITS TABLET    Take 2,000 Units by mouth daily.    Immunization History  Administered Date(s) Administered  . Influenza-Unspecified 10/04/2012  . Zoster 01/05/2011     Review of Systems  Constitutional: Positive for activity change and fatigue. Negative for fever.  HENT: Negative for congestion, ear discharge, ear pain, hearing loss, rhinorrhea, sore throat,  tinnitus, trouble swallowing and voice change.   Cardiovascular: Negative for chest pain, palpitations and leg swelling.       Left wrist AVF with thrill  Gastrointestinal: Negative for nausea, abdominal pain, diarrhea, constipation and abdominal distention.  Endocrine: Negative.   Genitourinary: Negative.        Chronic renal failure  Musculoskeletal: Negative for arthralgias, back pain, gait problem, joint swelling, myalgias and neck pain.  Skin: Negative for color change, pallor, rash and wound.  Allergic/Immunologic: Negative.   Neurological: Positive for weakness. Negative for dizziness, tremors, seizures, syncope, light-headedness, numbness and headaches.  Hematological: Negative for adenopathy.  Psychiatric/Behavioral: Negative for behavioral problems, confusion, sleep disturbance and dysphoric mood. The patient is not nervous/anxious.       Filed Vitals:   06/04/13 1606  BP: 140/70  Pulse: 76  Temp: 97.9 F (36.6 C)  TempSrc: Oral  Weight: 167 lb (75.751 kg)   Body mass index is 24.65 kg/(m^2).  Physical Exam  Constitutional: He is oriented to person, place, and time. He appears well-developed and well-nourished. No distress.  HENT:  Right Ear: External ear normal.  Left Ear: External ear normal.  Nose: Nose normal.  Mouth/Throat: Oropharynx is clear and moist. No oropharyngeal exudate.  Eyes: Conjunctivae and EOM are normal. Pupils are equal, round, and reactive to light.  Neck: No JVD present. No tracheal deviation present. No thyromegaly present.  Cardiovascular: Normal rate, regular rhythm, normal heart sounds and intact distal pulses.  Exam reveals no gallop and no friction rub.   No murmur heard. Pulmonary/Chest: No respiratory distress. He has no wheezes. He has no rales. He exhibits no tenderness.  Abdominal: He exhibits no distension and no mass. There is no tenderness.  Musculoskeletal: Normal range of motion. He exhibits no edema and no tenderness.    Lymphadenopathy:    He has no cervical adenopathy.  Neurological: He is alert and oriented to person, place, and time. He has normal reflexes. He displays normal reflexes. No cranial nerve deficit. Coordination normal.  Skin: No rash noted. No erythema. No pallor.  Psychiatric: He has a normal mood and affect. His behavior is normal. Thought content normal.        Labs reviewed: Nursing Home on 05/16/2013  Component Date Value Ref Range Status  . Hemoglobin 03/30/2013 10.8* 13.5 - 17.5 g/dL Final  . HCT 03/30/2013 32* 41 - 53 % Final  . Platelets 03/30/2013 203  150 - 399 K/L Final  . WBC 03/30/2013 7.5   Final  . Glucose 03/30/2013 79   Final  . BUN 03/30/2013 60* 4 - 21 mg/dL Final  .  Creatinine 03/30/2013 5.7* 0.6 - 1.3 mg/dL Final  . Potassium 03/30/2013 4.1  3.4 - 5.3 mmol/L Final  . Sodium 03/30/2013 141  137 - 147 mmol/L Final  . ALT 03/30/2013 16  10 - 40 U/L Final  . AST 03/30/2013 8* 14 - 40 U/L Final  . Triglycerides 02/07/2013 129  40 - 160 mg/dL Final  . Cholesterol 02/07/2013 117  0 - 200 mg/dL Final  . HDL 02/07/2013 36  35 - 70 mg/dL Final  . LDL Cholesterol 02/07/2013 55   Final  . Hemoglobin 05/17/2013 9.9* 13.5 - 17.5 g/dL Final  . HCT 05/17/2013 28* 41 - 53 % Final  . Platelets 05/17/2013 147* 150 - 399 K/L Final  . WBC 05/17/2013 4.9   Final  . Glucose 05/17/2013 95   Final  . BUN 05/17/2013 98* 4 - 21 mg/dL Final  . Creatinine 05/17/2013 7.9* 0.6 - 1.3 mg/dL Final  . Potassium 05/17/2013 4.0  3.4 - 5.3 mmol/L Final  . Sodium 05/17/2013 134* 137 - 147 mmol/L Final  . Alkaline Phosphatase 05/17/2013 50  25 - 125 U/L Final  . ALT 05/17/2013 19  10 - 40 U/L Final  . AST 05/17/2013 24  14 - 40 U/L Final     Assessment/Plan  1. Cough Present over a month. Non-productive. Does not interfere with sleep or activities.  2. Gout No recent attacks  3. Herpes zoster resolved  4. ESRD on dialysis Continue dialysis

## 2013-06-06 DIAGNOSIS — N186 End stage renal disease: Secondary | ICD-10-CM | POA: Diagnosis not present

## 2013-06-06 DIAGNOSIS — D631 Anemia in chronic kidney disease: Secondary | ICD-10-CM | POA: Diagnosis not present

## 2013-06-06 DIAGNOSIS — Z23 Encounter for immunization: Secondary | ICD-10-CM | POA: Diagnosis not present

## 2013-06-06 DIAGNOSIS — N2581 Secondary hyperparathyroidism of renal origin: Secondary | ICD-10-CM | POA: Diagnosis not present

## 2013-06-06 DIAGNOSIS — N039 Chronic nephritic syndrome with unspecified morphologic changes: Secondary | ICD-10-CM | POA: Diagnosis not present

## 2013-06-06 DIAGNOSIS — D509 Iron deficiency anemia, unspecified: Secondary | ICD-10-CM | POA: Diagnosis not present

## 2013-06-07 DIAGNOSIS — N2581 Secondary hyperparathyroidism of renal origin: Secondary | ICD-10-CM | POA: Diagnosis not present

## 2013-06-07 DIAGNOSIS — Z23 Encounter for immunization: Secondary | ICD-10-CM | POA: Diagnosis not present

## 2013-06-07 DIAGNOSIS — N186 End stage renal disease: Secondary | ICD-10-CM | POA: Diagnosis not present

## 2013-06-07 DIAGNOSIS — D509 Iron deficiency anemia, unspecified: Secondary | ICD-10-CM | POA: Diagnosis not present

## 2013-06-07 DIAGNOSIS — D631 Anemia in chronic kidney disease: Secondary | ICD-10-CM | POA: Diagnosis not present

## 2013-06-09 DIAGNOSIS — N2581 Secondary hyperparathyroidism of renal origin: Secondary | ICD-10-CM | POA: Diagnosis not present

## 2013-06-09 DIAGNOSIS — D509 Iron deficiency anemia, unspecified: Secondary | ICD-10-CM | POA: Diagnosis not present

## 2013-06-09 DIAGNOSIS — N186 End stage renal disease: Secondary | ICD-10-CM | POA: Diagnosis not present

## 2013-06-09 DIAGNOSIS — D631 Anemia in chronic kidney disease: Secondary | ICD-10-CM | POA: Diagnosis not present

## 2013-06-09 DIAGNOSIS — Z23 Encounter for immunization: Secondary | ICD-10-CM | POA: Diagnosis not present

## 2013-06-11 ENCOUNTER — Encounter: Payer: Self-pay | Admitting: Internal Medicine

## 2013-06-12 DIAGNOSIS — Z23 Encounter for immunization: Secondary | ICD-10-CM | POA: Diagnosis not present

## 2013-06-12 DIAGNOSIS — N186 End stage renal disease: Secondary | ICD-10-CM | POA: Diagnosis not present

## 2013-06-12 DIAGNOSIS — D631 Anemia in chronic kidney disease: Secondary | ICD-10-CM | POA: Diagnosis not present

## 2013-06-12 DIAGNOSIS — D509 Iron deficiency anemia, unspecified: Secondary | ICD-10-CM | POA: Diagnosis not present

## 2013-06-12 DIAGNOSIS — N2581 Secondary hyperparathyroidism of renal origin: Secondary | ICD-10-CM | POA: Diagnosis not present

## 2013-06-13 ENCOUNTER — Encounter: Payer: Self-pay | Admitting: Geriatric Medicine

## 2013-06-14 DIAGNOSIS — D509 Iron deficiency anemia, unspecified: Secondary | ICD-10-CM | POA: Diagnosis not present

## 2013-06-14 DIAGNOSIS — N2581 Secondary hyperparathyroidism of renal origin: Secondary | ICD-10-CM | POA: Diagnosis not present

## 2013-06-14 DIAGNOSIS — N186 End stage renal disease: Secondary | ICD-10-CM | POA: Diagnosis not present

## 2013-06-14 DIAGNOSIS — Z23 Encounter for immunization: Secondary | ICD-10-CM | POA: Diagnosis not present

## 2013-06-14 DIAGNOSIS — D631 Anemia in chronic kidney disease: Secondary | ICD-10-CM | POA: Diagnosis not present

## 2013-06-16 DIAGNOSIS — D631 Anemia in chronic kidney disease: Secondary | ICD-10-CM | POA: Diagnosis not present

## 2013-06-16 DIAGNOSIS — D509 Iron deficiency anemia, unspecified: Secondary | ICD-10-CM | POA: Diagnosis not present

## 2013-06-16 DIAGNOSIS — Z23 Encounter for immunization: Secondary | ICD-10-CM | POA: Diagnosis not present

## 2013-06-16 DIAGNOSIS — N186 End stage renal disease: Secondary | ICD-10-CM | POA: Diagnosis not present

## 2013-06-16 DIAGNOSIS — N039 Chronic nephritic syndrome with unspecified morphologic changes: Secondary | ICD-10-CM | POA: Diagnosis not present

## 2013-06-16 DIAGNOSIS — N2581 Secondary hyperparathyroidism of renal origin: Secondary | ICD-10-CM | POA: Diagnosis not present

## 2013-06-19 DIAGNOSIS — N2581 Secondary hyperparathyroidism of renal origin: Secondary | ICD-10-CM | POA: Diagnosis not present

## 2013-06-19 DIAGNOSIS — Z23 Encounter for immunization: Secondary | ICD-10-CM | POA: Diagnosis not present

## 2013-06-19 DIAGNOSIS — D509 Iron deficiency anemia, unspecified: Secondary | ICD-10-CM | POA: Diagnosis not present

## 2013-06-19 DIAGNOSIS — D631 Anemia in chronic kidney disease: Secondary | ICD-10-CM | POA: Diagnosis not present

## 2013-06-19 DIAGNOSIS — N186 End stage renal disease: Secondary | ICD-10-CM | POA: Diagnosis not present

## 2013-06-21 DIAGNOSIS — D631 Anemia in chronic kidney disease: Secondary | ICD-10-CM | POA: Diagnosis not present

## 2013-06-21 DIAGNOSIS — Z23 Encounter for immunization: Secondary | ICD-10-CM | POA: Diagnosis not present

## 2013-06-21 DIAGNOSIS — N2581 Secondary hyperparathyroidism of renal origin: Secondary | ICD-10-CM | POA: Diagnosis not present

## 2013-06-21 DIAGNOSIS — N186 End stage renal disease: Secondary | ICD-10-CM | POA: Diagnosis not present

## 2013-06-21 DIAGNOSIS — D509 Iron deficiency anemia, unspecified: Secondary | ICD-10-CM | POA: Diagnosis not present

## 2013-06-22 DIAGNOSIS — T82898A Other specified complication of vascular prosthetic devices, implants and grafts, initial encounter: Secondary | ICD-10-CM | POA: Diagnosis not present

## 2013-06-22 DIAGNOSIS — N186 End stage renal disease: Secondary | ICD-10-CM | POA: Diagnosis not present

## 2013-06-22 DIAGNOSIS — I871 Compression of vein: Secondary | ICD-10-CM | POA: Diagnosis not present

## 2013-06-23 DIAGNOSIS — Z23 Encounter for immunization: Secondary | ICD-10-CM | POA: Diagnosis not present

## 2013-06-23 DIAGNOSIS — N2581 Secondary hyperparathyroidism of renal origin: Secondary | ICD-10-CM | POA: Diagnosis not present

## 2013-06-23 DIAGNOSIS — D509 Iron deficiency anemia, unspecified: Secondary | ICD-10-CM | POA: Diagnosis not present

## 2013-06-23 DIAGNOSIS — N186 End stage renal disease: Secondary | ICD-10-CM | POA: Diagnosis not present

## 2013-06-23 DIAGNOSIS — D631 Anemia in chronic kidney disease: Secondary | ICD-10-CM | POA: Diagnosis not present

## 2013-06-26 DIAGNOSIS — D631 Anemia in chronic kidney disease: Secondary | ICD-10-CM | POA: Diagnosis not present

## 2013-06-26 DIAGNOSIS — N2581 Secondary hyperparathyroidism of renal origin: Secondary | ICD-10-CM | POA: Diagnosis not present

## 2013-06-26 DIAGNOSIS — D509 Iron deficiency anemia, unspecified: Secondary | ICD-10-CM | POA: Diagnosis not present

## 2013-06-26 DIAGNOSIS — N039 Chronic nephritic syndrome with unspecified morphologic changes: Secondary | ICD-10-CM | POA: Diagnosis not present

## 2013-06-26 DIAGNOSIS — Z23 Encounter for immunization: Secondary | ICD-10-CM | POA: Diagnosis not present

## 2013-06-26 DIAGNOSIS — N186 End stage renal disease: Secondary | ICD-10-CM | POA: Diagnosis not present

## 2013-06-28 DIAGNOSIS — N2581 Secondary hyperparathyroidism of renal origin: Secondary | ICD-10-CM | POA: Diagnosis not present

## 2013-06-28 DIAGNOSIS — D509 Iron deficiency anemia, unspecified: Secondary | ICD-10-CM | POA: Diagnosis not present

## 2013-06-28 DIAGNOSIS — D631 Anemia in chronic kidney disease: Secondary | ICD-10-CM | POA: Diagnosis not present

## 2013-06-28 DIAGNOSIS — Z23 Encounter for immunization: Secondary | ICD-10-CM | POA: Diagnosis not present

## 2013-06-28 DIAGNOSIS — N186 End stage renal disease: Secondary | ICD-10-CM | POA: Diagnosis not present

## 2013-06-30 DIAGNOSIS — N2581 Secondary hyperparathyroidism of renal origin: Secondary | ICD-10-CM | POA: Diagnosis not present

## 2013-06-30 DIAGNOSIS — D631 Anemia in chronic kidney disease: Secondary | ICD-10-CM | POA: Diagnosis not present

## 2013-06-30 DIAGNOSIS — N186 End stage renal disease: Secondary | ICD-10-CM | POA: Diagnosis not present

## 2013-06-30 DIAGNOSIS — D509 Iron deficiency anemia, unspecified: Secondary | ICD-10-CM | POA: Diagnosis not present

## 2013-06-30 DIAGNOSIS — N039 Chronic nephritic syndrome with unspecified morphologic changes: Secondary | ICD-10-CM | POA: Diagnosis not present

## 2013-06-30 DIAGNOSIS — Z23 Encounter for immunization: Secondary | ICD-10-CM | POA: Diagnosis not present

## 2013-07-03 DIAGNOSIS — Z23 Encounter for immunization: Secondary | ICD-10-CM | POA: Diagnosis not present

## 2013-07-03 DIAGNOSIS — D631 Anemia in chronic kidney disease: Secondary | ICD-10-CM | POA: Diagnosis not present

## 2013-07-03 DIAGNOSIS — D509 Iron deficiency anemia, unspecified: Secondary | ICD-10-CM | POA: Diagnosis not present

## 2013-07-03 DIAGNOSIS — N2581 Secondary hyperparathyroidism of renal origin: Secondary | ICD-10-CM | POA: Diagnosis not present

## 2013-07-03 DIAGNOSIS — N186 End stage renal disease: Secondary | ICD-10-CM | POA: Diagnosis not present

## 2013-07-05 DIAGNOSIS — D631 Anemia in chronic kidney disease: Secondary | ICD-10-CM | POA: Diagnosis not present

## 2013-07-05 DIAGNOSIS — N186 End stage renal disease: Secondary | ICD-10-CM | POA: Diagnosis not present

## 2013-07-05 DIAGNOSIS — N2581 Secondary hyperparathyroidism of renal origin: Secondary | ICD-10-CM | POA: Diagnosis not present

## 2013-07-05 DIAGNOSIS — Z23 Encounter for immunization: Secondary | ICD-10-CM | POA: Diagnosis not present

## 2013-07-05 DIAGNOSIS — D509 Iron deficiency anemia, unspecified: Secondary | ICD-10-CM | POA: Diagnosis not present

## 2013-07-12 ENCOUNTER — Other Ambulatory Visit: Payer: Self-pay | Admitting: *Deleted

## 2013-07-12 MED ORDER — ALLOPURINOL 100 MG PO TABS: 100.0000 mg | ORAL_TABLET | Freq: Every day | ORAL | Status: DC

## 2013-08-03 ENCOUNTER — Other Ambulatory Visit: Payer: Self-pay | Admitting: *Deleted

## 2013-08-03 DIAGNOSIS — N186 End stage renal disease: Secondary | ICD-10-CM | POA: Diagnosis not present

## 2013-08-03 MED ORDER — AMLODIPINE BESYLATE 10 MG PO TABS: 10.0000 mg | ORAL_TABLET | Freq: Every day | ORAL | Status: DC

## 2013-08-03 MED ORDER — ALLOPURINOL 100 MG PO TABS: 100.0000 mg | ORAL_TABLET | Freq: Every day | ORAL | Status: DC

## 2013-08-03 NOTE — Telephone Encounter (Addendum)
Pt called regarding wanting a refill on his Amlodipine and Allopurinol.  Allopurinol was sent to pharmacy on 78/9/15 with 90 qty, he will check on that and get back with me. Amlodipine sent to the pharmacy with 90 qty today. Pt called back stating that he didn't get the RX for Allopurinol so sent it as well to pharmacy

## 2013-08-04 DIAGNOSIS — N186 End stage renal disease: Secondary | ICD-10-CM | POA: Diagnosis not present

## 2013-08-04 DIAGNOSIS — D631 Anemia in chronic kidney disease: Secondary | ICD-10-CM | POA: Diagnosis not present

## 2013-08-04 DIAGNOSIS — N2581 Secondary hyperparathyroidism of renal origin: Secondary | ICD-10-CM | POA: Diagnosis not present

## 2013-09-03 DIAGNOSIS — N186 End stage renal disease: Secondary | ICD-10-CM | POA: Diagnosis not present

## 2013-09-04 DIAGNOSIS — D631 Anemia in chronic kidney disease: Secondary | ICD-10-CM | POA: Diagnosis not present

## 2013-09-04 DIAGNOSIS — N2581 Secondary hyperparathyroidism of renal origin: Secondary | ICD-10-CM | POA: Diagnosis not present

## 2013-09-04 DIAGNOSIS — D509 Iron deficiency anemia, unspecified: Secondary | ICD-10-CM | POA: Diagnosis not present

## 2013-09-04 DIAGNOSIS — N186 End stage renal disease: Secondary | ICD-10-CM | POA: Diagnosis not present

## 2013-09-04 DIAGNOSIS — N184 Chronic kidney disease, stage 4 (severe): Secondary | ICD-10-CM | POA: Diagnosis not present

## 2013-09-12 ENCOUNTER — Other Ambulatory Visit: Payer: Self-pay | Admitting: *Deleted

## 2013-09-12 MED ORDER — ATORVASTATIN CALCIUM 20 MG PO TABS: 20.0000 mg | ORAL_TABLET | Freq: Every day | ORAL | Status: DC

## 2013-09-12 NOTE — Telephone Encounter (Signed)
Patient Requested to be faxed to [facility]

## 2013-09-24 ENCOUNTER — Encounter: Payer: Self-pay | Admitting: Internal Medicine

## 2013-09-24 ENCOUNTER — Non-Acute Institutional Stay: Payer: Medicare Other | Admitting: Internal Medicine

## 2013-09-24 VITALS — BP 132/72 | HR 60 | Wt 175.0 lb

## 2013-09-24 DIAGNOSIS — R05 Cough: Secondary | ICD-10-CM

## 2013-09-24 DIAGNOSIS — Z992 Dependence on renal dialysis: Secondary | ICD-10-CM

## 2013-09-24 DIAGNOSIS — I10 Essential (primary) hypertension: Secondary | ICD-10-CM | POA: Diagnosis not present

## 2013-09-24 DIAGNOSIS — G4700 Insomnia, unspecified: Secondary | ICD-10-CM

## 2013-09-24 DIAGNOSIS — M542 Cervicalgia: Secondary | ICD-10-CM

## 2013-09-24 DIAGNOSIS — N186 End stage renal disease: Secondary | ICD-10-CM

## 2013-09-24 DIAGNOSIS — R059 Cough, unspecified: Secondary | ICD-10-CM

## 2013-09-24 DIAGNOSIS — K219 Gastro-esophageal reflux disease without esophagitis: Secondary | ICD-10-CM

## 2013-09-24 MED ORDER — MELATONIN 3 MG PO TABS: ORAL_TABLET | ORAL | Status: DC

## 2013-09-24 NOTE — Progress Notes (Signed)
Patient ID: Eric [name], male   DOB: [birth year], 80 y.o.   MRN: [id number]    Location:  [facility] (12)    No Known Allergies  Chief Complaint  Patient presents with  . Medical Management of Chronic Issues    blood pressure, GERD, cough  . Sleeping Problem    trouble falling asleep, and wakes up at night    HPI:  Insomnia, unspecified - restless  ESRD on dialysis: tolerating. Complains some of fatigue  Hypertension: controlled  Cough: imporoved  Gastroesophageal reflux disease without esophagitis: resolved  Pain in the neck: hurts especially at night. Previously had device to stretch his neck He believes he has a pinched nerve. Mild radiation of pain to the right shoulder. No loss of grip strengthl; Medications: Patient's Medications  New Prescriptions   MELATONIN 3 MG TABS    One at bedtimes  Previous Medications   ALLOPURINOL (ZYLOPRIM) 100 MG TABLET    Take 1 tablet (100 mg total) by mouth daily.   AMLODIPINE (NORVASC) 10 MG TABLET    Take 1 tablet (10 mg total) by mouth daily.   ASPIRIN 81 MG TABLET    Take 81 mg by mouth daily.   ATORVASTATIN (LIPITOR) 20 MG TABLET    Take 1 tablet (20 mg total) by mouth daily.   MULTIVITAMIN (RENA-VIT) TABS TABLET    Take 1 tablet by mouth daily. Take one tablet daily   OMEPRAZOLE (PRILOSEC) 20 MG CAPSULE    Take 20 mg by mouth. Take one tablet four times a week   VITAMIN D, ERGOCALCIFEROL, (DRISDOL) 50000 UNITS CAPS CAPSULE    Take 50,000 Units by mouth. Take one every other week  Modified Medications   No medications on file  Discontinued Medications   No medications on file     Review of Systems  Constitutional: Positive for activity change and fatigue. Negative for fever.  HENT: Negative for congestion, ear discharge, ear pain, hearing loss, rhinorrhea, sore throat, tinnitus, trouble swallowing and voice change.   Cardiovascular: Negative for chest pain, palpitations and  leg swelling.       Left wrist AVF with thrill  Gastrointestinal: Negative for nausea, abdominal pain, diarrhea, constipation and abdominal distention.  Endocrine: Negative.   Genitourinary: Negative.        Chronic renal failure. On dialysis.  Musculoskeletal: Positive for neck pain. Negative for arthralgias, back pain, gait problem, joint swelling and myalgias.  Skin: Negative for color change, pallor, rash and wound.  Allergic/Immunologic: Negative.   Neurological: Positive for weakness. Negative for dizziness, tremors, seizures, syncope, light-headedness, numbness and headaches.  Hematological: Negative for adenopathy.  Psychiatric/Behavioral: Positive for sleep disturbance. Negative for behavioral problems, confusion and dysphoric mood. The patient is not nervous/anxious.     Filed Vitals:   09/24/13 1621  BP: 132/72  Pulse: 60  Weight: 175 lb (79.379 kg)   Body mass index is 25.83 kg/(m^2).  Physical Exam  Constitutional: He is oriented to person, place, and time. He appears well-developed and well-nourished. No distress.  HENT:  Right Ear: External ear normal.  Left Ear: External ear normal.  Nose: Nose normal.  Mouth/Throat: Oropharynx is clear and moist. No oropharyngeal exudate.  Eyes: Conjunctivae and EOM are normal. Pupils are equal, round, and reactive to light.  Neck: No JVD present. No tracheal deviation present. No thyromegaly present.  Cardiovascular: Normal rate, regular rhythm, normal heart sounds and intact distal pulses.  Exam reveals no gallop and  no friction rub.   No murmur heard. Pulmonary/Chest: No respiratory distress. He has no wheezes. He has no rales. He exhibits no tenderness.  Abdominal: He exhibits no distension and no mass. There is no tenderness.  Musculoskeletal: Normal range of motion. He exhibits no edema and no tenderness.  Uncomfortable to rotate neck   Lymphadenopathy:    He has no cervical adenopathy.  Neurological: He is alert and  oriented to person, place, and time. He has normal reflexes. He displays normal reflexes. No cranial nerve deficit. Coordination normal.  Skin: No rash noted. No erythema. No pallor.  Psychiatric: He has a normal mood and affect. His behavior is normal. Thought content normal.     Labs reviewed: No visits with results within 3 Month(s) from this visit. Latest known visit with results is:  Nursing Home on 05/16/2013  Component Date Value Ref Range Status  . Hemoglobin 03/30/2013 10.8* 13.5 - 17.5 g/dL Final  . HCT 03/30/2013 32* 41 - 53 % Final  . Platelets 03/30/2013 203  150 - 399 K/L Final  . WBC 03/30/2013 7.5   Final  . Glucose 03/30/2013 79   Final  . BUN 03/30/2013 60* 4 - 21 mg/dL Final  . Creatinine 03/30/2013 5.7* 0.6 - 1.3 mg/dL Final  . Potassium 03/30/2013 4.1  3.4 - 5.3 mmol/L Final  . Sodium 03/30/2013 141  137 - 147 mmol/L Final  . ALT 03/30/2013 16  10 - 40 U/L Final  . AST 03/30/2013 8* 14 - 40 U/L Final  . Triglycerides 02/07/2013 129  40 - 160 mg/dL Final  . Cholesterol 02/07/2013 117  0 - 200 mg/dL Final  . HDL 02/07/2013 36  35 - 70 mg/dL Final  . LDL Cholesterol 02/07/2013 55   Final  . Hemoglobin 05/17/2013 9.9* 13.5 - 17.5 g/dL Final  . HCT 05/17/2013 28* 41 - 53 % Final  . Platelets 05/17/2013 147* 150 - 399 K/L Final  . WBC 05/17/2013 4.9   Final  . Glucose 05/17/2013 95   Final  . BUN 05/17/2013 98* 4 - 21 mg/dL Final  . Creatinine 05/17/2013 7.9* 0.6 - 1.3 mg/dL Final  . Potassium 05/17/2013 4.0  3.4 - 5.3 mmol/L Final  . Sodium 05/17/2013 134* 137 - 147 mmol/L Final  . Alkaline Phosphatase 05/17/2013 50  25 - 125 U/L Final  . ALT 05/17/2013 19  10 - 40 U/L Final  . AST 05/17/2013 24  14 - 40 U/L Final    Assessment/Plan  1. Insomnia, unspecified - Melatonin 3 MG TABS; One at bedtimes  Dispense: 100 tablet; Refill: 1  2. ESRD on dialysis continue  3. Hypertension controlled  4. Cough improved  5. Gastroesophageal reflux disease  without esophagitis resolved  6. Pain in neck Neck stretcher from [email or web address] recommended. Blow up stacked balloon accordion.

## 2013-10-03 DIAGNOSIS — I871 Compression of vein: Secondary | ICD-10-CM | POA: Diagnosis not present

## 2013-10-03 DIAGNOSIS — N186 End stage renal disease: Secondary | ICD-10-CM | POA: Diagnosis not present

## 2013-10-03 DIAGNOSIS — T82898A Other specified complication of vascular prosthetic devices, implants and grafts, initial encounter: Secondary | ICD-10-CM | POA: Diagnosis not present

## 2013-10-04 DIAGNOSIS — D509 Iron deficiency anemia, unspecified: Secondary | ICD-10-CM | POA: Diagnosis not present

## 2013-10-04 DIAGNOSIS — D631 Anemia in chronic kidney disease: Secondary | ICD-10-CM | POA: Diagnosis not present

## 2013-10-04 DIAGNOSIS — N186 End stage renal disease: Secondary | ICD-10-CM | POA: Diagnosis not present

## 2013-10-04 DIAGNOSIS — N184 Chronic kidney disease, stage 4 (severe): Secondary | ICD-10-CM | POA: Diagnosis not present

## 2013-10-04 DIAGNOSIS — N2581 Secondary hyperparathyroidism of renal origin: Secondary | ICD-10-CM | POA: Diagnosis not present

## 2013-10-19 DIAGNOSIS — Z23 Encounter for immunization: Secondary | ICD-10-CM | POA: Diagnosis not present

## 2013-11-01 DIAGNOSIS — E780 Pure hypercholesterolemia: Secondary | ICD-10-CM | POA: Diagnosis not present

## 2013-11-01 DIAGNOSIS — E559 Vitamin D deficiency, unspecified: Secondary | ICD-10-CM | POA: Diagnosis not present

## 2013-11-01 DIAGNOSIS — Z79899 Other long term (current) drug therapy: Secondary | ICD-10-CM | POA: Diagnosis not present

## 2013-11-01 DIAGNOSIS — J111 Influenza due to unidentified influenza virus with other respiratory manifestations: Secondary | ICD-10-CM | POA: Diagnosis not present

## 2013-11-01 DIAGNOSIS — R55 Syncope and collapse: Secondary | ICD-10-CM | POA: Diagnosis not present

## 2013-11-01 DIAGNOSIS — R972 Elevated prostate specific antigen [PSA]: Secondary | ICD-10-CM | POA: Diagnosis not present

## 2013-11-02 ENCOUNTER — Other Ambulatory Visit: Payer: Self-pay | Admitting: *Deleted

## 2013-11-02 MED ORDER — AMLODIPINE BESYLATE 10 MG PO TABS: ORAL_TABLET | ORAL | Status: DC

## 2013-11-02 NOTE — Telephone Encounter (Signed)
Patient Requested and wanted faxed to [facility]

## 2013-11-03 DIAGNOSIS — N186 End stage renal disease: Secondary | ICD-10-CM | POA: Diagnosis not present

## 2013-11-03 DIAGNOSIS — Z992 Dependence on renal dialysis: Secondary | ICD-10-CM | POA: Diagnosis not present

## 2013-11-06 DIAGNOSIS — N184 Chronic kidney disease, stage 4 (severe): Secondary | ICD-10-CM | POA: Diagnosis not present

## 2013-11-06 DIAGNOSIS — D631 Anemia in chronic kidney disease: Secondary | ICD-10-CM | POA: Diagnosis not present

## 2013-11-06 DIAGNOSIS — N2581 Secondary hyperparathyroidism of renal origin: Secondary | ICD-10-CM | POA: Diagnosis not present

## 2013-11-06 DIAGNOSIS — D509 Iron deficiency anemia, unspecified: Secondary | ICD-10-CM | POA: Diagnosis not present

## 2013-11-06 DIAGNOSIS — N186 End stage renal disease: Secondary | ICD-10-CM | POA: Diagnosis not present

## 2013-11-16 DIAGNOSIS — Z992 Dependence on renal dialysis: Secondary | ICD-10-CM | POA: Diagnosis not present

## 2013-11-16 DIAGNOSIS — T82858A Stenosis of vascular prosthetic devices, implants and grafts, initial encounter: Secondary | ICD-10-CM | POA: Diagnosis not present

## 2013-11-16 DIAGNOSIS — N186 End stage renal disease: Secondary | ICD-10-CM | POA: Diagnosis not present

## 2013-11-16 DIAGNOSIS — I871 Compression of vein: Secondary | ICD-10-CM | POA: Diagnosis not present

## 2013-12-03 DIAGNOSIS — Z992 Dependence on renal dialysis: Secondary | ICD-10-CM | POA: Diagnosis not present

## 2013-12-03 DIAGNOSIS — N186 End stage renal disease: Secondary | ICD-10-CM | POA: Diagnosis not present

## 2013-12-04 DIAGNOSIS — D509 Iron deficiency anemia, unspecified: Secondary | ICD-10-CM | POA: Diagnosis not present

## 2013-12-04 DIAGNOSIS — D631 Anemia in chronic kidney disease: Secondary | ICD-10-CM | POA: Diagnosis not present

## 2013-12-04 DIAGNOSIS — N186 End stage renal disease: Secondary | ICD-10-CM | POA: Diagnosis not present

## 2013-12-04 DIAGNOSIS — Z23 Encounter for immunization: Secondary | ICD-10-CM | POA: Diagnosis not present

## 2013-12-04 DIAGNOSIS — N2581 Secondary hyperparathyroidism of renal origin: Secondary | ICD-10-CM | POA: Diagnosis not present

## 2013-12-04 DIAGNOSIS — N184 Chronic kidney disease, stage 4 (severe): Secondary | ICD-10-CM | POA: Diagnosis not present

## 2014-01-03 DIAGNOSIS — N186 End stage renal disease: Secondary | ICD-10-CM | POA: Diagnosis not present

## 2014-01-03 DIAGNOSIS — Z992 Dependence on renal dialysis: Secondary | ICD-10-CM | POA: Diagnosis not present

## 2014-01-06 DIAGNOSIS — D631 Anemia in chronic kidney disease: Secondary | ICD-10-CM | POA: Diagnosis not present

## 2014-01-06 DIAGNOSIS — D509 Iron deficiency anemia, unspecified: Secondary | ICD-10-CM | POA: Diagnosis not present

## 2014-01-06 DIAGNOSIS — N184 Chronic kidney disease, stage 4 (severe): Secondary | ICD-10-CM | POA: Diagnosis not present

## 2014-01-06 DIAGNOSIS — N186 End stage renal disease: Secondary | ICD-10-CM | POA: Diagnosis not present

## 2014-01-06 DIAGNOSIS — N2581 Secondary hyperparathyroidism of renal origin: Secondary | ICD-10-CM | POA: Diagnosis not present

## 2014-01-08 DIAGNOSIS — D631 Anemia in chronic kidney disease: Secondary | ICD-10-CM | POA: Diagnosis not present

## 2014-01-08 DIAGNOSIS — N2581 Secondary hyperparathyroidism of renal origin: Secondary | ICD-10-CM | POA: Diagnosis not present

## 2014-01-08 DIAGNOSIS — N186 End stage renal disease: Secondary | ICD-10-CM | POA: Diagnosis not present

## 2014-01-08 DIAGNOSIS — D509 Iron deficiency anemia, unspecified: Secondary | ICD-10-CM | POA: Diagnosis not present

## 2014-01-08 DIAGNOSIS — N184 Chronic kidney disease, stage 4 (severe): Secondary | ICD-10-CM | POA: Diagnosis not present

## 2014-01-10 DIAGNOSIS — D509 Iron deficiency anemia, unspecified: Secondary | ICD-10-CM | POA: Diagnosis not present

## 2014-01-10 DIAGNOSIS — N186 End stage renal disease: Secondary | ICD-10-CM | POA: Diagnosis not present

## 2014-01-10 DIAGNOSIS — N184 Chronic kidney disease, stage 4 (severe): Secondary | ICD-10-CM | POA: Diagnosis not present

## 2014-01-10 DIAGNOSIS — N2581 Secondary hyperparathyroidism of renal origin: Secondary | ICD-10-CM | POA: Diagnosis not present

## 2014-01-10 DIAGNOSIS — D631 Anemia in chronic kidney disease: Secondary | ICD-10-CM | POA: Diagnosis not present

## 2014-01-12 DIAGNOSIS — N2581 Secondary hyperparathyroidism of renal origin: Secondary | ICD-10-CM | POA: Diagnosis not present

## 2014-01-12 DIAGNOSIS — N184 Chronic kidney disease, stage 4 (severe): Secondary | ICD-10-CM | POA: Diagnosis not present

## 2014-01-12 DIAGNOSIS — D509 Iron deficiency anemia, unspecified: Secondary | ICD-10-CM | POA: Diagnosis not present

## 2014-01-12 DIAGNOSIS — N186 End stage renal disease: Secondary | ICD-10-CM | POA: Diagnosis not present

## 2014-01-12 DIAGNOSIS — D631 Anemia in chronic kidney disease: Secondary | ICD-10-CM | POA: Diagnosis not present

## 2014-01-15 DIAGNOSIS — D631 Anemia in chronic kidney disease: Secondary | ICD-10-CM | POA: Diagnosis not present

## 2014-01-15 DIAGNOSIS — T82858D Stenosis of vascular prosthetic devices, implants and grafts, subsequent encounter: Secondary | ICD-10-CM | POA: Diagnosis not present

## 2014-01-15 DIAGNOSIS — Z992 Dependence on renal dialysis: Secondary | ICD-10-CM | POA: Diagnosis not present

## 2014-01-15 DIAGNOSIS — N2581 Secondary hyperparathyroidism of renal origin: Secondary | ICD-10-CM | POA: Diagnosis not present

## 2014-01-15 DIAGNOSIS — D509 Iron deficiency anemia, unspecified: Secondary | ICD-10-CM | POA: Diagnosis not present

## 2014-01-15 DIAGNOSIS — N186 End stage renal disease: Secondary | ICD-10-CM | POA: Diagnosis not present

## 2014-01-15 DIAGNOSIS — N184 Chronic kidney disease, stage 4 (severe): Secondary | ICD-10-CM | POA: Diagnosis not present

## 2014-01-15 DIAGNOSIS — I871 Compression of vein: Secondary | ICD-10-CM | POA: Diagnosis not present

## 2014-01-15 HISTORY — PX: INSERTION OF DIALYSIS CATHETER: SHX1324

## 2014-01-16 ENCOUNTER — Other Ambulatory Visit: Payer: Self-pay | Admitting: *Deleted

## 2014-01-16 MED ORDER — AMLODIPINE BESYLATE 10 MG PO TABS: ORAL_TABLET | ORAL | Status: DC

## 2014-01-16 MED ORDER — ATORVASTATIN CALCIUM 20 MG PO TABS: 20.0000 mg | ORAL_TABLET | Freq: Every day | ORAL | Status: DC

## 2014-01-16 NOTE — Telephone Encounter (Signed)
Patient requested Rx sent to [facility]

## 2014-01-17 ENCOUNTER — Other Ambulatory Visit: Payer: Self-pay

## 2014-01-17 DIAGNOSIS — N184 Chronic kidney disease, stage 4 (severe): Secondary | ICD-10-CM | POA: Diagnosis not present

## 2014-01-17 DIAGNOSIS — D631 Anemia in chronic kidney disease: Secondary | ICD-10-CM | POA: Diagnosis not present

## 2014-01-17 DIAGNOSIS — N186 End stage renal disease: Secondary | ICD-10-CM | POA: Diagnosis not present

## 2014-01-17 DIAGNOSIS — Z01810 Encounter for preprocedural cardiovascular examination: Secondary | ICD-10-CM

## 2014-01-17 DIAGNOSIS — N2581 Secondary hyperparathyroidism of renal origin: Secondary | ICD-10-CM | POA: Diagnosis not present

## 2014-01-17 DIAGNOSIS — D509 Iron deficiency anemia, unspecified: Secondary | ICD-10-CM | POA: Diagnosis not present

## 2014-01-19 DIAGNOSIS — D631 Anemia in chronic kidney disease: Secondary | ICD-10-CM | POA: Diagnosis not present

## 2014-01-19 DIAGNOSIS — N2581 Secondary hyperparathyroidism of renal origin: Secondary | ICD-10-CM | POA: Diagnosis not present

## 2014-01-19 DIAGNOSIS — N184 Chronic kidney disease, stage 4 (severe): Secondary | ICD-10-CM | POA: Diagnosis not present

## 2014-01-19 DIAGNOSIS — N186 End stage renal disease: Secondary | ICD-10-CM | POA: Diagnosis not present

## 2014-01-19 DIAGNOSIS — D509 Iron deficiency anemia, unspecified: Secondary | ICD-10-CM | POA: Diagnosis not present

## 2014-01-22 DIAGNOSIS — D509 Iron deficiency anemia, unspecified: Secondary | ICD-10-CM | POA: Diagnosis not present

## 2014-01-22 DIAGNOSIS — D631 Anemia in chronic kidney disease: Secondary | ICD-10-CM | POA: Diagnosis not present

## 2014-01-22 DIAGNOSIS — N186 End stage renal disease: Secondary | ICD-10-CM | POA: Diagnosis not present

## 2014-01-22 DIAGNOSIS — N184 Chronic kidney disease, stage 4 (severe): Secondary | ICD-10-CM | POA: Diagnosis not present

## 2014-01-22 DIAGNOSIS — N2581 Secondary hyperparathyroidism of renal origin: Secondary | ICD-10-CM | POA: Diagnosis not present

## 2014-01-24 DIAGNOSIS — N184 Chronic kidney disease, stage 4 (severe): Secondary | ICD-10-CM | POA: Diagnosis not present

## 2014-01-24 DIAGNOSIS — D631 Anemia in chronic kidney disease: Secondary | ICD-10-CM | POA: Diagnosis not present

## 2014-01-24 DIAGNOSIS — D509 Iron deficiency anemia, unspecified: Secondary | ICD-10-CM | POA: Diagnosis not present

## 2014-01-24 DIAGNOSIS — N186 End stage renal disease: Secondary | ICD-10-CM | POA: Diagnosis not present

## 2014-01-24 DIAGNOSIS — N2581 Secondary hyperparathyroidism of renal origin: Secondary | ICD-10-CM | POA: Diagnosis not present

## 2014-01-24 LAB — CBC AND DIFFERENTIAL
HEMATOCRIT: 31 % — AB (ref 41–53)
HEMOGLOBIN: 10.3 g/dL — AB (ref 13.5–17.5)
PLATELETS: 225 10*3/uL (ref 150–399)
WBC: 5.8 10*3/mL

## 2014-01-24 LAB — BASIC METABOLIC PANEL
Creatinine: 6.5 mg/dL — AB (ref 0.6–1.3)
Potassium: 4.6 mmol/L (ref 3.4–5.3)
Sodium: 139 mmol/L (ref 137–147)

## 2014-01-26 DIAGNOSIS — N186 End stage renal disease: Secondary | ICD-10-CM | POA: Diagnosis not present

## 2014-01-26 DIAGNOSIS — D509 Iron deficiency anemia, unspecified: Secondary | ICD-10-CM | POA: Diagnosis not present

## 2014-01-26 DIAGNOSIS — D631 Anemia in chronic kidney disease: Secondary | ICD-10-CM | POA: Diagnosis not present

## 2014-01-26 DIAGNOSIS — N2581 Secondary hyperparathyroidism of renal origin: Secondary | ICD-10-CM | POA: Diagnosis not present

## 2014-01-26 DIAGNOSIS — N184 Chronic kidney disease, stage 4 (severe): Secondary | ICD-10-CM | POA: Diagnosis not present

## 2014-01-29 DIAGNOSIS — D509 Iron deficiency anemia, unspecified: Secondary | ICD-10-CM | POA: Diagnosis not present

## 2014-01-29 DIAGNOSIS — N184 Chronic kidney disease, stage 4 (severe): Secondary | ICD-10-CM | POA: Diagnosis not present

## 2014-01-29 DIAGNOSIS — N2581 Secondary hyperparathyroidism of renal origin: Secondary | ICD-10-CM | POA: Diagnosis not present

## 2014-01-29 DIAGNOSIS — D631 Anemia in chronic kidney disease: Secondary | ICD-10-CM | POA: Diagnosis not present

## 2014-01-29 DIAGNOSIS — N186 End stage renal disease: Secondary | ICD-10-CM | POA: Diagnosis not present

## 2014-01-31 DIAGNOSIS — D631 Anemia in chronic kidney disease: Secondary | ICD-10-CM | POA: Diagnosis not present

## 2014-01-31 DIAGNOSIS — D509 Iron deficiency anemia, unspecified: Secondary | ICD-10-CM | POA: Diagnosis not present

## 2014-01-31 DIAGNOSIS — N184 Chronic kidney disease, stage 4 (severe): Secondary | ICD-10-CM | POA: Diagnosis not present

## 2014-01-31 DIAGNOSIS — N2581 Secondary hyperparathyroidism of renal origin: Secondary | ICD-10-CM | POA: Diagnosis not present

## 2014-01-31 DIAGNOSIS — N186 End stage renal disease: Secondary | ICD-10-CM | POA: Diagnosis not present

## 2014-02-02 DIAGNOSIS — D509 Iron deficiency anemia, unspecified: Secondary | ICD-10-CM | POA: Diagnosis not present

## 2014-02-02 DIAGNOSIS — N186 End stage renal disease: Secondary | ICD-10-CM | POA: Diagnosis not present

## 2014-02-02 DIAGNOSIS — D631 Anemia in chronic kidney disease: Secondary | ICD-10-CM | POA: Diagnosis not present

## 2014-02-02 DIAGNOSIS — N2581 Secondary hyperparathyroidism of renal origin: Secondary | ICD-10-CM | POA: Diagnosis not present

## 2014-02-02 DIAGNOSIS — N184 Chronic kidney disease, stage 4 (severe): Secondary | ICD-10-CM | POA: Diagnosis not present

## 2014-02-03 DIAGNOSIS — N186 End stage renal disease: Secondary | ICD-10-CM | POA: Diagnosis not present

## 2014-02-03 DIAGNOSIS — Z992 Dependence on renal dialysis: Secondary | ICD-10-CM | POA: Diagnosis not present

## 2014-02-05 DIAGNOSIS — D631 Anemia in chronic kidney disease: Secondary | ICD-10-CM | POA: Diagnosis not present

## 2014-02-05 DIAGNOSIS — N186 End stage renal disease: Secondary | ICD-10-CM | POA: Diagnosis not present

## 2014-02-05 DIAGNOSIS — N2581 Secondary hyperparathyroidism of renal origin: Secondary | ICD-10-CM | POA: Diagnosis not present

## 2014-02-05 DIAGNOSIS — N184 Chronic kidney disease, stage 4 (severe): Secondary | ICD-10-CM | POA: Diagnosis not present

## 2014-02-05 DIAGNOSIS — D509 Iron deficiency anemia, unspecified: Secondary | ICD-10-CM | POA: Diagnosis not present

## 2014-02-07 DIAGNOSIS — D509 Iron deficiency anemia, unspecified: Secondary | ICD-10-CM | POA: Diagnosis not present

## 2014-02-07 DIAGNOSIS — N2581 Secondary hyperparathyroidism of renal origin: Secondary | ICD-10-CM | POA: Diagnosis not present

## 2014-02-07 DIAGNOSIS — N184 Chronic kidney disease, stage 4 (severe): Secondary | ICD-10-CM | POA: Diagnosis not present

## 2014-02-07 DIAGNOSIS — D631 Anemia in chronic kidney disease: Secondary | ICD-10-CM | POA: Diagnosis not present

## 2014-02-07 DIAGNOSIS — N186 End stage renal disease: Secondary | ICD-10-CM | POA: Diagnosis not present

## 2014-02-09 DIAGNOSIS — D509 Iron deficiency anemia, unspecified: Secondary | ICD-10-CM | POA: Diagnosis not present

## 2014-02-09 DIAGNOSIS — N2581 Secondary hyperparathyroidism of renal origin: Secondary | ICD-10-CM | POA: Diagnosis not present

## 2014-02-09 DIAGNOSIS — D631 Anemia in chronic kidney disease: Secondary | ICD-10-CM | POA: Diagnosis not present

## 2014-02-09 DIAGNOSIS — N186 End stage renal disease: Secondary | ICD-10-CM | POA: Diagnosis not present

## 2014-02-09 DIAGNOSIS — N184 Chronic kidney disease, stage 4 (severe): Secondary | ICD-10-CM | POA: Diagnosis not present

## 2014-02-12 DIAGNOSIS — D509 Iron deficiency anemia, unspecified: Secondary | ICD-10-CM | POA: Diagnosis not present

## 2014-02-12 DIAGNOSIS — D631 Anemia in chronic kidney disease: Secondary | ICD-10-CM | POA: Diagnosis not present

## 2014-02-12 DIAGNOSIS — N186 End stage renal disease: Secondary | ICD-10-CM | POA: Diagnosis not present

## 2014-02-12 DIAGNOSIS — N2581 Secondary hyperparathyroidism of renal origin: Secondary | ICD-10-CM | POA: Diagnosis not present

## 2014-02-12 DIAGNOSIS — N184 Chronic kidney disease, stage 4 (severe): Secondary | ICD-10-CM | POA: Diagnosis not present

## 2014-02-14 DIAGNOSIS — D631 Anemia in chronic kidney disease: Secondary | ICD-10-CM | POA: Diagnosis not present

## 2014-02-14 DIAGNOSIS — D509 Iron deficiency anemia, unspecified: Secondary | ICD-10-CM | POA: Diagnosis not present

## 2014-02-14 DIAGNOSIS — N2581 Secondary hyperparathyroidism of renal origin: Secondary | ICD-10-CM | POA: Diagnosis not present

## 2014-02-14 DIAGNOSIS — N186 End stage renal disease: Secondary | ICD-10-CM | POA: Diagnosis not present

## 2014-02-14 DIAGNOSIS — N184 Chronic kidney disease, stage 4 (severe): Secondary | ICD-10-CM | POA: Diagnosis not present

## 2014-02-15 ENCOUNTER — Encounter: Payer: Self-pay | Admitting: Surgery

## 2014-02-16 DIAGNOSIS — N184 Chronic kidney disease, stage 4 (severe): Secondary | ICD-10-CM | POA: Diagnosis not present

## 2014-02-16 DIAGNOSIS — D631 Anemia in chronic kidney disease: Secondary | ICD-10-CM | POA: Diagnosis not present

## 2014-02-16 DIAGNOSIS — D509 Iron deficiency anemia, unspecified: Secondary | ICD-10-CM | POA: Diagnosis not present

## 2014-02-16 DIAGNOSIS — N2581 Secondary hyperparathyroidism of renal origin: Secondary | ICD-10-CM | POA: Diagnosis not present

## 2014-02-16 DIAGNOSIS — N186 End stage renal disease: Secondary | ICD-10-CM | POA: Diagnosis not present

## 2014-02-18 ENCOUNTER — Other Ambulatory Visit (HOSPITAL_COMMUNITY): Payer: Self-pay

## 2014-02-18 ENCOUNTER — Encounter (HOSPITAL_COMMUNITY): Payer: Self-pay

## 2014-02-18 ENCOUNTER — Ambulatory Visit: Payer: Self-pay | Admitting: Surgery

## 2014-02-19 DIAGNOSIS — N186 End stage renal disease: Secondary | ICD-10-CM | POA: Diagnosis not present

## 2014-02-19 DIAGNOSIS — N184 Chronic kidney disease, stage 4 (severe): Secondary | ICD-10-CM | POA: Diagnosis not present

## 2014-02-19 DIAGNOSIS — D509 Iron deficiency anemia, unspecified: Secondary | ICD-10-CM | POA: Diagnosis not present

## 2014-02-19 DIAGNOSIS — D631 Anemia in chronic kidney disease: Secondary | ICD-10-CM | POA: Diagnosis not present

## 2014-02-19 DIAGNOSIS — N2581 Secondary hyperparathyroidism of renal origin: Secondary | ICD-10-CM | POA: Diagnosis not present

## 2014-02-21 DIAGNOSIS — N184 Chronic kidney disease, stage 4 (severe): Secondary | ICD-10-CM | POA: Diagnosis not present

## 2014-02-21 DIAGNOSIS — N186 End stage renal disease: Secondary | ICD-10-CM | POA: Diagnosis not present

## 2014-02-21 DIAGNOSIS — D631 Anemia in chronic kidney disease: Secondary | ICD-10-CM | POA: Diagnosis not present

## 2014-02-21 DIAGNOSIS — D509 Iron deficiency anemia, unspecified: Secondary | ICD-10-CM | POA: Diagnosis not present

## 2014-02-21 DIAGNOSIS — N2581 Secondary hyperparathyroidism of renal origin: Secondary | ICD-10-CM | POA: Diagnosis not present

## 2014-02-23 DIAGNOSIS — N184 Chronic kidney disease, stage 4 (severe): Secondary | ICD-10-CM | POA: Diagnosis not present

## 2014-02-23 DIAGNOSIS — N186 End stage renal disease: Secondary | ICD-10-CM | POA: Diagnosis not present

## 2014-02-23 DIAGNOSIS — N2581 Secondary hyperparathyroidism of renal origin: Secondary | ICD-10-CM | POA: Diagnosis not present

## 2014-02-23 DIAGNOSIS — D631 Anemia in chronic kidney disease: Secondary | ICD-10-CM | POA: Diagnosis not present

## 2014-02-23 DIAGNOSIS — D509 Iron deficiency anemia, unspecified: Secondary | ICD-10-CM | POA: Diagnosis not present

## 2014-02-26 DIAGNOSIS — N186 End stage renal disease: Secondary | ICD-10-CM | POA: Diagnosis not present

## 2014-02-26 DIAGNOSIS — D631 Anemia in chronic kidney disease: Secondary | ICD-10-CM | POA: Diagnosis not present

## 2014-02-26 DIAGNOSIS — N184 Chronic kidney disease, stage 4 (severe): Secondary | ICD-10-CM | POA: Diagnosis not present

## 2014-02-26 DIAGNOSIS — D509 Iron deficiency anemia, unspecified: Secondary | ICD-10-CM | POA: Diagnosis not present

## 2014-02-26 DIAGNOSIS — N2581 Secondary hyperparathyroidism of renal origin: Secondary | ICD-10-CM | POA: Diagnosis not present

## 2014-02-27 ENCOUNTER — Other Ambulatory Visit: Payer: Self-pay

## 2014-02-28 ENCOUNTER — Encounter: Payer: Self-pay | Admitting: Vascular Surgery

## 2014-02-28 DIAGNOSIS — D509 Iron deficiency anemia, unspecified: Secondary | ICD-10-CM | POA: Diagnosis not present

## 2014-02-28 DIAGNOSIS — D631 Anemia in chronic kidney disease: Secondary | ICD-10-CM | POA: Diagnosis not present

## 2014-02-28 DIAGNOSIS — N184 Chronic kidney disease, stage 4 (severe): Secondary | ICD-10-CM | POA: Diagnosis not present

## 2014-02-28 DIAGNOSIS — N2581 Secondary hyperparathyroidism of renal origin: Secondary | ICD-10-CM | POA: Diagnosis not present

## 2014-02-28 DIAGNOSIS — N186 End stage renal disease: Secondary | ICD-10-CM | POA: Diagnosis not present

## 2014-02-28 LAB — CBC AND DIFFERENTIAL
HCT: 34 % — AB (ref 41–53)
HEMOGLOBIN: 11.1 g/dL — AB (ref 13.5–17.5)
WBC: 4.5 10*3/mL

## 2014-03-01 ENCOUNTER — Ambulatory Visit (HOSPITAL_COMMUNITY)
Admission: RE | Admit: 2014-03-01 | Discharge: 2014-03-01 | Disposition: A | Discharge status: Home / Self Care | Payer: Medicare Other | Source: Ambulatory Visit | Attending: Surgery | Admitting: Surgery

## 2014-03-01 ENCOUNTER — Other Ambulatory Visit: Payer: Self-pay

## 2014-03-01 ENCOUNTER — Encounter: Payer: Self-pay | Admitting: Vascular Surgery

## 2014-03-01 ENCOUNTER — Ambulatory Visit (INDEPENDENT_AMBULATORY_CARE_PROVIDER_SITE_OTHER)
Admission: RE | Admit: 2014-03-01 | Discharge: 2014-03-01 | Disposition: A | Discharge status: Home / Self Care | Payer: Medicare Other | Source: Ambulatory Visit | Attending: Surgery | Admitting: Surgery

## 2014-03-01 ENCOUNTER — Ambulatory Visit (INDEPENDENT_AMBULATORY_CARE_PROVIDER_SITE_OTHER): Payer: Medicare Other | Admitting: Vascular Surgery

## 2014-03-01 VITALS — BP 130/84 | HR 68 | Temp 97.8°F | Resp 16 | Ht 70.0 in | Wt 175.0 lb

## 2014-03-01 DIAGNOSIS — N186 End stage renal disease: Secondary | ICD-10-CM | POA: Diagnosis not present

## 2014-03-01 DIAGNOSIS — Z01810 Encounter for preprocedural cardiovascular examination: Secondary | ICD-10-CM | POA: Insufficient documentation

## 2014-03-01 DIAGNOSIS — Z992 Dependence on renal dialysis: Secondary | ICD-10-CM | POA: Diagnosis not present

## 2014-03-01 LAB — BASIC METABOLIC PANEL
Creatinine: 6.9 mg/dL — AB (ref 0.6–1.3)
Potassium: 4.7 mmol/L (ref 3.4–5.3)
SODIUM: 140 mmol/L (ref 137–147)

## 2014-03-01 NOTE — Progress Notes (Signed)
HISTORY AND PHYSICAL     CC:  New HD access Referring Provider:  No ref. provider found  HPI: [name] who [place] here [place] near [place].  He does have ESRD and has been on HD for ~ 9 months.  He states that he had 2 fistulas placed in his left lower arm, which also required multiple surgeries to be "cleaned out".  During one of these procedures in 2014, he states that his heart stopped while he was on the table and he states that this was due to the blood pressure medication that he was taking.  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  He denies any hx of chest pain/MI or stroke. This fistula has since clotted again and he had a diatek catheter placed by Dr. Augustin Coupe in mid January 2016 and is here today for evaluation for new access.  He dialyzes T/T/S at the [facility].  He is on a statin for hypercholesterolemia.  He is on Norvasc for HTN.   Past Medical History  Diagnosis Date  . CKD (chronic kidney disease) stage 5, GFR less than 15 ml/min   . Unspecified essential hypertension   . GERD (gastroesophageal reflux disease)   . Gout   . Herpes zoster 04/2012    Past Surgical History  Procedure Laterality Date  . Appendectomy  1944  . Cataract extraction w/ intraocular lens implant Bilateral 2014    [place]  . Dialysis fistula creation  08/04/2012 and 10/13    Dr. Harden Mo  . Colonoscopy  2013    Dr. Annamaria Helling [place], [place].  Marland Kitchen Av fistula placement Left     Left Cimino AVF placed in [place]   . Insertion of dialysis catheter Right 01-15-14    Right chest TDC placed by Dr. Augustin Coupe at [facility]  . Eye surgery      Cataract    No Known Allergies  Current Outpatient Prescriptions  Medication Sig Dispense Refill  . allopurinol (ZYLOPRIM) 100 MG tablet Take 1 tablet (100 mg total) by mouth daily. 90 tablet 0  . amLODipine (NORVASC) 10 MG tablet Take one tablet by mouth once  daily to control blood pressure 30 tablet 3  . aspirin 81 MG tablet Take 81 mg by mouth daily.    Marland Kitchen atorvastatin (LIPITOR) 20 MG tablet Take 1 tablet (20 mg total) by mouth daily. 30 tablet 3  . multivitamin (RENA-VIT) TABS tablet Take 1 tablet by mouth daily. Take one tablet daily    . omeprazole (PRILOSEC) 20 MG capsule Take 20 mg by mouth. Take one tablet four times a week    . Vitamin D, Ergocalciferol, (DRISDOL) 50000 UNITS CAPS capsule Take 50,000 Units by mouth. Take one every other week    . Melatonin 3 MG TABS One at bedtimes (Patient not taking: Reported on 03/01/2014) 100 tablet 1   No current facility-administered medications for this visit.    Family History  Problem Relation Age of Onset  . Cancer Father     lung    History   Social History  . Marital Status: Married    Spouse Name: N/A  . Number of Children: N/A  . Years of Education: 16   Occupational History  . retired Designer, fashion/clothing    Social History Main Topics  . Smoking status: Never Smoker   . Smokeless tobacco: Never Used  .  Alcohol Use: 1.2 oz/week    1 Glasses of wine, 1 Shots of liquor per week     Comment: one glass a day of either wine or liquor  . Drug Use: No  . Sexual Activity: Not on file   Other Topics Concern  . Not on file   Social History Narrative   Patient is Married since 1958. Occupation: Licensed conveyancer   Lives in apartment,  Independent Living  section at [facility] since 05/04/2013. Also has a home in [place], [place].   No Smoking history  Alcohol history: 5 drinks/ week   Regular exercise: 3-4 times a week free weights, treadmill   Patient has Advanced planning documents: Living Will, HCPOA             ROS: [x]  Positive   [ ]  Negative   [ ]  All sytems reviewed and are negative  Cardiovascular: []  chest pain/pressure []  palpitations []  SOB lying flat []  DOE []  pain in legs while walking []  pain in feet when lying flat []  hx of DVT []  hx of  phlebitis []  swelling in legs []  varicose veins  Pulmonary: []  productive cough []  asthma []  wheezing  Neurologic: []  weakness in []  arms []  legs []  numbness in []  arms []  legs [] difficulty speaking or slurred speech []  temporary loss of vision in one eye []  dizziness  Hematologic: []  bleeding problems []  problems with blood clotting easily  GI []  vomiting blood []  blood in stool  GU: []  burning with urination []  blood in urine  Psychiatric: []  hx of major depression  Integumentary: []  rashes []  ulcers  Constitutional: []  fever []  chills   PHYSICAL EXAMINATION:  Filed Vitals:   03/01/14 1614  BP: 130/84  Pulse: 68  Temp: 97.8 F (36.6 C)  Resp: 16   Body mass index is 25.11 kg/(m^2).  General:  WDWN in NAD Gait: Normal HENT: WNL, normocephalic Pulmonary: normal non-labored breathing  Cardiac: RRR Abdomen: soft, NT, no masses Skin: without rashes, without ulcers  Vascular Exam/Pulses:  Right Left  Radial 2+ (normal) 2+ (normal)  Ulnar 1+ (weak) 1+ (weak)   Extremities: without ischemic changes, without Gangrene , without cellulitis; without open wounds; well healed scars left wrist and left snuff box.  Several areas of scar from previous sticks for HD left arm. Musculoskeletal: no muscle wasting or atrophy  Neurologic: A&O X 3; Appropriate Affect ; SENSATION: normal; MOTOR FUNCTION:  moving all extremities equally. Speech is fluent/normal Psychiatric: Judgment intact, Mood & affect appropriate for pt's clinical situation Lymph : No Cervical, Axillary, or Inguinal lymphadenopathy    Non-Invasive Vascular Imaging:   Upper extremity vein mapping 0000000 The left cephalic is not adequate for fistula The left basilic is adequate measuring 0.49cm-0.94cm  The right cephalic is adequate for fistula measuring Q000111Q The Right basilic measures Q000111Q  Upper extremity arterial duplex evaluation 03/01/14 Triphasic waveforms right   Triphasic/biphasic waveforms left  Pt meds includes: Statin:  Yes.   Beta Blocker:  No. Aspirin:  Yes.   ACEI:  No. ARB:  No. Other Antiplatelet/Anticoagulant:  No.    ASSESSMENT/PLAN:: 81 y.o. male with ESRD on HD in need of new HD access   -pt has had left radial cephalic AVF that has clotted.   -Dr. Bridgett Larsson offered him either a left BVT, which would require 2 stage operation or a right radial cephalic AVF, which would be one operation, which is what he is opting for. -he understands that there is a risk  of steal syndrome and that it is possible he will require further operations to optimize the fistula for maturation. -given he has had what sounds like cardiac arrest during one of his prior procedures, he has opted for MAC anesthesia as he wants to get this done as soon as possible -he will continue to dialyze via right IJ catheter placed by Dr. Augustin Coupe in January 2016.     Leontine Locket, PA-C [facility] [phone]  Clinic MD:  Pt seen and examined in conjunction with Dr. Bridgett Larsson  Addendum  I have independently interviewed and examined the patient, and I agree with the physician assistant's findings.  Not certain if Anesthesia is willing to proceed with MAC/Local anesthesia for a R RC vs BC AVF placement in this patient given his significant cardiac history.  Pt would likely ASAP access placement.  I had already raised the need for Cardiac evaluation for his longitudinal care.  Looks likely Dr. Kellie Simmering might be able to do this patient's procedure on Wednesday if Anesthesia permits.  Adele Barthel, MD [facility] [place] Office: [phone] Pager: [phone]  03/01/2014, 6:28 PM

## 2014-03-02 DIAGNOSIS — D509 Iron deficiency anemia, unspecified: Secondary | ICD-10-CM | POA: Diagnosis not present

## 2014-03-02 DIAGNOSIS — N186 End stage renal disease: Secondary | ICD-10-CM | POA: Diagnosis not present

## 2014-03-02 DIAGNOSIS — D631 Anemia in chronic kidney disease: Secondary | ICD-10-CM | POA: Diagnosis not present

## 2014-03-02 DIAGNOSIS — N2581 Secondary hyperparathyroidism of renal origin: Secondary | ICD-10-CM | POA: Diagnosis not present

## 2014-03-02 DIAGNOSIS — N184 Chronic kidney disease, stage 4 (severe): Secondary | ICD-10-CM | POA: Diagnosis not present

## 2014-03-04 DIAGNOSIS — Z992 Dependence on renal dialysis: Secondary | ICD-10-CM | POA: Diagnosis not present

## 2014-03-04 DIAGNOSIS — N186 End stage renal disease: Secondary | ICD-10-CM | POA: Diagnosis not present

## 2014-03-05 ENCOUNTER — Encounter (HOSPITAL_COMMUNITY): Payer: Self-pay | Admitting: *Deleted

## 2014-03-05 DIAGNOSIS — N2581 Secondary hyperparathyroidism of renal origin: Secondary | ICD-10-CM | POA: Diagnosis not present

## 2014-03-05 DIAGNOSIS — N184 Chronic kidney disease, stage 4 (severe): Secondary | ICD-10-CM | POA: Diagnosis not present

## 2014-03-05 DIAGNOSIS — D631 Anemia in chronic kidney disease: Secondary | ICD-10-CM | POA: Diagnosis not present

## 2014-03-05 DIAGNOSIS — D509 Iron deficiency anemia, unspecified: Secondary | ICD-10-CM | POA: Diagnosis not present

## 2014-03-05 DIAGNOSIS — N186 End stage renal disease: Secondary | ICD-10-CM | POA: Diagnosis not present

## 2014-03-05 MED ORDER — DEXTROSE 5 % IV SOLN
1.5000 g | INTRAVENOUS | Status: AC
  Administered 2014-03-06: 1.5 g via INTRAVENOUS
  Filled 2014-03-05: qty 1.5

## 2014-03-05 MED ORDER — SODIUM CHLORIDE 0.9 % IV SOLN
INTRAVENOUS | Status: DC
Start: 2014-03-05 — End: 2014-03-06
  Administered 2014-03-06 (×2): via INTRAVENOUS

## 2014-03-05 NOTE — Progress Notes (Signed)
   03/05/14 [place]  Have you ever been diagnosed with sleep apnea through a sleep study? No  Do you snore loudly (loud enough to be heard through closed doors)?  0  Do you often feel tired, fatigued, or sleepy during the daytime? 1  Has anyone observed you stop breathing during your sleep? 0  Do you have, or are you being treated for high blood pressure? 1  BMI more than 35 kg/m2? 0  Age over 79 years old? 1  Gender: 1

## 2014-03-05 NOTE — Progress Notes (Signed)
Pt denies SOB, chest pain, and being under the care of a cardiologist. Pt stated that he had several cardiac studies done in [facility] last year. Pt made aware to stop NSAID's, otc vitamins and herbal medications ( Melatonin). Pt verbalized understanding of pre-op instructions.

## 2014-03-06 ENCOUNTER — Ambulatory Visit (HOSPITAL_COMMUNITY): Payer: Medicare Other | Admitting: Certified Registered Nurse Anesthetist

## 2014-03-06 ENCOUNTER — Encounter (HOSPITAL_COMMUNITY)
Admission: RE | Disposition: A | Discharge status: Home / Self Care | Payer: Self-pay | Source: Ambulatory Visit | Attending: Vascular Surgery

## 2014-03-06 ENCOUNTER — Encounter (HOSPITAL_COMMUNITY): Payer: Self-pay | Admitting: *Deleted

## 2014-03-06 ENCOUNTER — Ambulatory Visit (HOSPITAL_COMMUNITY)
Admission: RE | Admit: 2014-03-06 | Discharge: 2014-03-06 | Disposition: A | Discharge status: Home / Self Care | Payer: Medicare Other | Source: Ambulatory Visit | Attending: Vascular Surgery | Admitting: Vascular Surgery

## 2014-03-06 DIAGNOSIS — Z7982 Long term (current) use of aspirin: Secondary | ICD-10-CM | POA: Diagnosis not present

## 2014-03-06 DIAGNOSIS — Z79899 Other long term (current) drug therapy: Secondary | ICD-10-CM | POA: Diagnosis not present

## 2014-03-06 DIAGNOSIS — Z992 Dependence on renal dialysis: Secondary | ICD-10-CM | POA: Diagnosis not present

## 2014-03-06 DIAGNOSIS — K219 Gastro-esophageal reflux disease without esophagitis: Secondary | ICD-10-CM | POA: Insufficient documentation

## 2014-03-06 DIAGNOSIS — Z9889 Other specified postprocedural states: Secondary | ICD-10-CM | POA: Diagnosis not present

## 2014-03-06 DIAGNOSIS — M109 Gout, unspecified: Secondary | ICD-10-CM | POA: Insufficient documentation

## 2014-03-06 DIAGNOSIS — E780 Pure hypercholesterolemia: Secondary | ICD-10-CM | POA: Diagnosis not present

## 2014-03-06 DIAGNOSIS — N186 End stage renal disease: Secondary | ICD-10-CM | POA: Diagnosis not present

## 2014-03-06 DIAGNOSIS — I120 Hypertensive chronic kidney disease with stage 5 chronic kidney disease or end stage renal disease: Secondary | ICD-10-CM | POA: Diagnosis not present

## 2014-03-06 HISTORY — PX: AV FISTULA PLACEMENT: SHX1204

## 2014-03-06 HISTORY — DX: Adverse effect of unspecified anesthetic, initial encounter: T41.45XA

## 2014-03-06 HISTORY — DX: Cardiac murmur, unspecified: R01.1

## 2014-03-06 HISTORY — DX: Other complications of anesthesia, initial encounter: T88.59XA

## 2014-03-06 LAB — POCT I-STAT 4, (NA,K, GLUC, HGB,HCT)
Glucose, Bld: 115 mg/dL — ABNORMAL HIGH (ref 70–99)
HCT: 37 % — ABNORMAL LOW (ref 39.0–52.0)
Hemoglobin: 12.6 g/dL — ABNORMAL LOW (ref 13.0–17.0)
Potassium: 4.5 mmol/L (ref 3.5–5.1)
Sodium: 138 mmol/L (ref 135–145)

## 2014-03-06 SURGERY — ARTERIOVENOUS (AV) FISTULA CREATION
Anesthesia: Monitor Anesthesia Care | Site: Arm Lower | Laterality: Right

## 2014-03-06 MED ORDER — MIDAZOLAM HCL 5 MG/5ML IJ SOLN
INTRAMUSCULAR | Status: DC | PRN
  Administered 2014-03-06 (×2): .5 mg via INTRAVENOUS

## 2014-03-06 MED ORDER — SODIUM CHLORIDE 0.9 % IR SOLN
Status: DC | PRN
  Administered 2014-03-06: 12:00:00

## 2014-03-06 MED ORDER — LIDOCAINE-EPINEPHRINE (PF) 1 %-1:200000 IJ SOLN
INTRAMUSCULAR | Status: DC | PRN
  Administered 2014-03-06: 15 mL via INTRADERMAL

## 2014-03-06 MED ORDER — FENTANYL CITRATE 0.05 MG/ML IJ SOLN: 25.0000 ug | INTRAMUSCULAR | Status: DC | PRN

## 2014-03-06 MED ORDER — MIDAZOLAM HCL 2 MG/2ML IJ SOLN
INTRAMUSCULAR | Status: AC
  Filled 2014-03-06: qty 2

## 2014-03-06 MED ORDER — PROPOFOL 10 MG/ML IV BOLUS
INTRAVENOUS | Status: AC
  Filled 2014-03-06: qty 20

## 2014-03-06 MED ORDER — OXYCODONE HCL 5 MG PO TABS: 5.0000 mg | ORAL_TABLET | Freq: Three times a day (TID) | ORAL | Status: DC | PRN

## 2014-03-06 MED ORDER — ONDANSETRON HCL 4 MG/2ML IJ SOLN
INTRAMUSCULAR | Status: DC | PRN
  Administered 2014-03-06: 4 mg via INTRAVENOUS

## 2014-03-06 MED ORDER — FENTANYL CITRATE 0.05 MG/ML IJ SOLN
INTRAMUSCULAR | Status: DC | PRN
  Administered 2014-03-06 (×3): 25 ug via INTRAVENOUS

## 2014-03-06 MED ORDER — LIDOCAINE-EPINEPHRINE (PF) 1 %-1:200000 IJ SOLN
INTRAMUSCULAR | Status: AC
  Filled 2014-03-06: qty 10

## 2014-03-06 MED ORDER — PROPOFOL INFUSION 10 MG/ML OPTIME
INTRAVENOUS | Status: DC | PRN
  Administered 2014-03-06: 50 ug/kg/min via INTRAVENOUS

## 2014-03-06 MED ORDER — CHLORHEXIDINE GLUCONATE CLOTH 2 % EX PADS: 6.0000 | MEDICATED_PAD | Freq: Once | CUTANEOUS | Status: DC

## 2014-03-06 MED ORDER — FENTANYL CITRATE 0.05 MG/ML IJ SOLN
INTRAMUSCULAR | Status: AC
  Filled 2014-03-06: qty 5

## 2014-03-06 MED ORDER — PROPOFOL 10 MG/ML IV BOLUS
INTRAVENOUS | Status: DC | PRN
  Administered 2014-03-06 (×2): 10 mg via INTRAVENOUS
  Administered 2014-03-06: 20 mg via INTRAVENOUS
  Administered 2014-03-06: 10 mg via INTRAVENOUS

## 2014-03-06 MED ORDER — 0.9 % SODIUM CHLORIDE (POUR BTL) OPTIME
TOPICAL | Status: DC | PRN
  Administered 2014-03-06: 1000 mL

## 2014-03-06 MED ORDER — LIDOCAINE HCL (CARDIAC) 20 MG/ML IV SOLN
INTRAVENOUS | Status: DC | PRN
  Administered 2014-03-06: 20 mg via INTRAVENOUS

## 2014-03-06 MED ORDER — ONDANSETRON HCL 4 MG/2ML IJ SOLN
INTRAMUSCULAR | Status: AC
  Filled 2014-03-06: qty 2

## 2014-03-06 MED ORDER — EPHEDRINE SULFATE 50 MG/ML IJ SOLN
INTRAMUSCULAR | Status: DC | PRN
  Administered 2014-03-06 (×6): 5 mg via INTRAVENOUS

## 2014-03-06 SURGICAL SUPPLY — 30 items
ARMBAND PINK RESTRICT EXTREMIT (MISCELLANEOUS) ×2 IMPLANT
BLADE SURG 10 STRL SS (BLADE) IMPLANT
CANISTER SUCTION 2500CC (MISCELLANEOUS) ×2 IMPLANT
CATH EMB 3FR 80CM (CATHETERS) ×2 IMPLANT
CLIP TI MEDIUM 6 (CLIP) ×2 IMPLANT
CLIP TI WIDE RED SMALL 6 (CLIP) ×2 IMPLANT
COVER PROBE W GEL 5X96 (DRAPES) IMPLANT
COVER SURGICAL LIGHT HANDLE (MISCELLANEOUS) IMPLANT
DRAIN PENROSE 1/4X12 LTX STRL (WOUND CARE) IMPLANT
ELECT REM PT RETURN 9FT ADLT (ELECTROSURGICAL) ×2
ELECTRODE REM PT RTRN 9FT ADLT (ELECTROSURGICAL) ×1 IMPLANT
GEL ULTRASOUND 20GR AQUASONIC (MISCELLANEOUS) IMPLANT
GLOVE BIO SURGEON STRL SZ 6.5 (GLOVE) ×4 IMPLANT
GLOVE SS BIOGEL STRL SZ 7 (GLOVE) ×1 IMPLANT
GLOVE SUPERSENSE BIOGEL SZ 7 (GLOVE) ×1
GLOVE SURG SS PI 7.0 STRL IVOR (GLOVE) ×2 IMPLANT
GOWN STRL REUS W/ TWL LRG LVL3 (GOWN DISPOSABLE) ×3 IMPLANT
GOWN STRL REUS W/TWL LRG LVL3 (GOWN DISPOSABLE) ×3
KIT BASIN OR (CUSTOM PROCEDURE TRAY) ×2 IMPLANT
KIT ROOM TURNOVER OR (KITS) ×2 IMPLANT
LIQUID BAND (GAUZE/BANDAGES/DRESSINGS) ×2 IMPLANT
NS IRRIG 1000ML POUR BTL (IV SOLUTION) ×2 IMPLANT
PACK CV ACCESS (CUSTOM PROCEDURE TRAY) ×2 IMPLANT
PAD ARMBOARD 7.5X6 YLW CONV (MISCELLANEOUS) ×4 IMPLANT
PROBE PENCIL 8 MHZ STRL DISP (MISCELLANEOUS) IMPLANT
SUT PROLENE 6 0 BV (SUTURE) ×2 IMPLANT
SUT VIC AB 3-0 SH 27 (SUTURE) ×1
SUT VIC AB 3-0 SH 27X BRD (SUTURE) ×1 IMPLANT
UNDERPAD 30X30 INCONTINENT (UNDERPADS AND DIAPERS) ×2 IMPLANT
WATER STERILE IRR 1000ML POUR (IV SOLUTION) ×2 IMPLANT

## 2014-03-06 NOTE — H&P (View-Only) (Signed)
HISTORY AND PHYSICAL     CC:  New HD access Referring Provider:  No ref. provider found  HPI: [name] who [place] here [place] near [place].  He does have ESRD and has been on HD for ~ 9 months.  He states that he had 2 fistulas placed in his left lower arm, which also required multiple surgeries to be "cleaned out".  During one of these procedures in 2014, he states that his heart stopped while he was on the table and he states that this was due to the blood pressure medication that he was taking.  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  He denies any hx of chest pain/MI or stroke. This fistula has since clotted again and he had a diatek catheter placed by Dr. Augustin [name] in mid January 2016 and is here today for evaluation for new access.  He dialyzes T/T/S at the [facility].  He is on a statin for hypercholesterolemia.  He is on Norvasc for HTN.   Past Medical History  Diagnosis Date  . CKD (chronic kidney disease) stage 5, GFR less than 15 ml/min   . Unspecified essential hypertension   . GERD (gastroesophageal reflux disease)   . Gout   . Herpes zoster 04/2012    Past Surgical History  Procedure Laterality Date  . Appendectomy  1944  . Cataract extraction w/ intraocular lens implant Bilateral 2014    [place]  . Dialysis fistula creation  08/04/2012 and 10/13    Dr. Harden [name]  . Colonoscopy  2013    Dr. Annamaria [name] [place], [place].  Marland Kitchen Av fistula placement Left     Left Eric [name] AVF placed in [place]   . Insertion of dialysis catheter Right 01-15-14    Right chest TDC placed by Dr. Augustin [name] at [facility]  . Eye surgery      Cataract    No Known Allergies  Current Outpatient Prescriptions  Medication Sig Dispense Refill  . allopurinol (ZYLOPRIM) 100 MG tablet Take 1 tablet (100 mg total) by mouth daily. 90 tablet 0  . amLODipine (NORVASC) 10 MG tablet Take one tablet by mouth once  daily to control blood pressure 30 tablet 3  . aspirin 81 MG tablet Take 81 mg by mouth daily.    Marland Kitchen atorvastatin (LIPITOR) 20 MG tablet Take 1 tablet (20 mg total) by mouth daily. 30 tablet 3  . multivitamin (RENA-VIT) TABS tablet Take 1 tablet by mouth daily. Take one tablet daily    . omeprazole (PRILOSEC) 20 MG capsule Take 20 mg by mouth. Take one tablet four times a week    . Vitamin D, Ergocalciferol, (DRISDOL) 50000 UNITS CAPS capsule Take 50,000 Units by mouth. Take one every other week    . Melatonin 3 MG TABS One at bedtimes (Patient not taking: Reported on 03/01/2014) 100 tablet 1   No current facility-administered medications for this visit.    Family History  Problem Relation Age of Onset  . Cancer Father     lung    History   Social History  . Marital Status: Married    Spouse Name: N/A  . Number of Children: N/A  . Years of Education: 16   Occupational History  . retired Designer, fashion/clothing    Social History Main Topics  . Smoking status: Never Smoker   . Smokeless tobacco: Never Used  .  Alcohol Use: 1.2 oz/week    1 Glasses of wine, 1 Shots of liquor per week     Comment: one glass a day of either wine or liquor  . Drug Use: No  . Sexual Activity: Not on file   Other Topics Concern  . Not on file   Social History Narrative   Patient is Married since 1958. Occupation: Licensed conveyancer   Lives in apartment,  Independent Living  section at [place] since 05/04/2013. Also has a home in [place], [place].   No Smoking history  Alcohol history: 5 drinks/ week   Regular exercise: 3-4 times a week free weights, treadmill   Patient has Advanced planning documents: Living Will, HCPOA             ROS: [x]  Positive   [ ]  Negative   [ ]  All sytems reviewed and are negative  Cardiovascular: []  chest pain/pressure []  palpitations []  SOB lying flat []  DOE []  pain in legs while walking []  pain in feet when lying flat []  hx of DVT []  hx of  phlebitis []  swelling in legs []  varicose veins  Pulmonary: []  productive cough []  asthma []  wheezing  Neurologic: []  weakness in []  arms []  legs []  numbness in []  arms []  legs [] difficulty speaking or slurred speech []  temporary loss of vision in one eye []  dizziness  Hematologic: []  bleeding problems []  problems with blood clotting easily  GI []  vomiting blood []  blood in stool  GU: []  burning with urination []  blood in urine  Psychiatric: []  hx of major depression  Integumentary: []  rashes []  ulcers  Constitutional: []  fever []  chills   PHYSICAL EXAMINATION:  Filed Vitals:   03/01/14 1614  BP: 130/84  Pulse: 68  Temp: 97.8 F (36.6 C)  Resp: 16   Body mass index is 25.11 kg/(m^2).  General:  WDWN in NAD Gait: Normal HENT: WNL, normocephalic Pulmonary: normal non-labored breathing  Cardiac: RRR Abdomen: soft, NT, no masses Skin: without rashes, without ulcers  Vascular Exam/Pulses:  Right Left  Radial 2+ (normal) 2+ (normal)  Ulnar 1+ (weak) 1+ (weak)   Extremities: without ischemic changes, without Gangrene , without cellulitis; without open wounds; well healed scars left wrist and left snuff box.  Several areas of scar from previous sticks for HD left arm. Musculoskeletal: no muscle wasting or atrophy  Neurologic: A&O X 3; Appropriate Affect ; SENSATION: normal; MOTOR FUNCTION:  moving all extremities equally. Speech is fluent/normal Psychiatric: Judgment intact, Mood & affect appropriate for pt's clinical situation Lymph : No Cervical, Axillary, or Inguinal lymphadenopathy    Non-Invasive Vascular Imaging:   Upper extremity vein mapping 0000000 The left cephalic is not adequate for fistula The left basilic is adequate measuring 0.49cm-0.94cm  The right cephalic is adequate for fistula measuring Q000111Q The Right basilic measures Q000111Q  Upper extremity arterial duplex evaluation 03/01/14 Triphasic waveforms right   Triphasic/biphasic waveforms left  Pt meds includes: Statin:  Yes.   Beta Blocker:  No. Aspirin:  Yes.   ACEI:  No. ARB:  No. Other Antiplatelet/Anticoagulant:  No.    ASSESSMENT/PLAN:: 81 y.o. male with ESRD on HD in need of new HD access   -pt has had left radial cephalic AVF that has clotted.   -Dr. Bridgett Larsson offered him either a left BVT, which would require 2 stage operation or a right radial cephalic AVF, which would be one operation, which is what he is opting for. -he understands that there is a risk  of steal syndrome and that it is possible he will require further operations to optimize the fistula for maturation. -given he has had what sounds like cardiac arrest during one of his prior procedures, he has opted for MAC anesthesia as he wants to get this done as soon as possible -he will continue to dialyze via right IJ catheter placed by Dr. Augustin [name] in January 2016.     Leontine Locket, PA-C [facility] [phone]  Clinic MD:  Pt seen and examined in conjunction with Dr. Bridgett Larsson  Addendum  I have independently interviewed and examined the patient, and I agree with the physician assistant's findings.  Not certain if Anesthesia is willing to proceed with MAC/Local anesthesia for a R RC vs BC AVF placement in this patient given his significant cardiac history.  Pt would likely ASAP access placement.  I had already raised the need for Cardiac evaluation for his longitudinal care.  Looks likely Dr. Kellie Simmering might be able to do this patient's procedure on Wednesday if Anesthesia permits.  Adele Barthel, MD [facility] [place] Office: [phone] Pager: [phone]  03/01/2014, 6:28 PM

## 2014-03-06 NOTE — Transfer of Care (Signed)
Immediate Anesthesia Transfer of Care Note  Patient: Eric [name]  Procedure(s) Performed: Procedure(s): ARTERIOVENOUS (AV) FISTULA CREATION-right radiocephalic (Right)  Patient Location: PACU  Anesthesia Type:MAC  Level of Consciousness: awake, alert  and oriented  Airway & Oxygen Therapy: Patient Spontanous Breathing  Post-op Assessment: Report given to RN, Post -op Vital signs reviewed and stable and Patient moving all extremities X 4  Post vital signs: Reviewed and stable  Last Vitals:  Filed Vitals:   03/06/14 0949  BP: 190/83  Pulse: 71  Temp: 36.6 C  Resp: 18  HR 76, Sats 99%, RR 19, BP [id number]  Complications: No apparent anesthesia complications

## 2014-03-06 NOTE — Interval H&P Note (Signed)
History and Physical Interval Note:  03/06/2014 11:19 AM  Eric [name]  has presented today for surgery, with the diagnosis of End Stage Renal Disease N18.6  The various methods of treatment have been discussed with the patient and family. After consideration of risks, benefits and other options for treatment, the patient has consented to  Procedure(s): ARTERIOVENOUS (AV) FISTULA CREATION-right radiocephalic (Right) as a surgical intervention .  The patient's history has been reviewed, patient examined, no change in status, stable for surgery.  I have reviewed the patient's chart and labs.  Questions were answered to the patient's satisfaction.     [name]

## 2014-03-06 NOTE — Op Note (Signed)
OPERATIVE REPORT  Date of Surgery: 03/06/2014  Surgeon: [name], MD  Assistant: Leontine Locket PA  Pre-op Diagnosis: End Stage Renal Disease N18.6  Post-op Diagnosis: End Stage Renal Disease N18.6  Procedure: Procedure(s): Creation right radial-cephalic AV fistula  Anesthesia: Mac  EBL: Minimal  Complications: None  Procedure Details: Patient was taken the operating room placed in supine position at which time right upper extremity was prepped Betadine scrub and solution draped in routine sterile manner. The cephalic vein had been imaged with the sono site ultrasound prior to prepping and draping in these results compared to preoperative vein mapping. Cephalic vein appeared to be borderline but adequate from the wrist to the antecubital space and slightly larger in the upper arm. It was decided to attempt a forearm radial cephalic fistula. After infiltration forms and Xylocaine with epinephrine short longitudinal incision was made just proximal to the wrist between the radial artery and cephalic vein. Vein was dissected free it had to symmetrical branches which converged and proximal to that the vein was at least 3 mm in size. 3 Fogarty catheter was passed up the vein and it was patent up to the shoulder level. Radial artery was exposed and encircled with Vesseloops. It was a 3 mm artery with a good pulse. Normal. Artery was occluded proximally and distally with Vesseloops open 15 blade extended with Potts scissors it would accept a 3 mm dilator. The vein was then spatulated and anastomosed end to side with 6-0 Prolene Vesseloops released there was a pulse and palpable thrill near the wrist and excellent Doppler flow up to the antecubital space. Adequate hemostasis was achieve wound closed in layers with Vicryl in subcuticular fashion with Dermabond patient taken to recovery room in stable condition   [name], MD 03/06/2014 2:05 PM

## 2014-03-06 NOTE — Anesthesia Postprocedure Evaluation (Signed)
  Anesthesia Post-op Note  Patient: Eric [name]  Procedure(s) Performed: Procedure(s): ARTERIOVENOUS (AV) FISTULA CREATION-right radiocephalic (Right)  Patient Location: PACU  Anesthesia Type: MAC  Level of Consciousness: awake and alert   Airway and Oxygen Therapy: Patient Spontanous Breathing  Post-op Pain: none  Post-op Assessment: Post-op Vital signs reviewed, Patient's Cardiovascular Status Stable and Respiratory Function Stable  Post-op Vital Signs: Reviewed  Filed Vitals:   03/06/14 1355  BP: 125/61  Pulse: 64  Temp:   Resp: 15    Complications: No apparent anesthesia complications

## 2014-03-06 NOTE — OR Nursing (Signed)
Pt c/o numbness in Right hand.  Notified Tarry Kos PA.  Dr. Kellie Simmering will assess at bedside.  Good radial pulse and positive bruit/thrill in fistula.

## 2014-03-06 NOTE — Anesthesia Preprocedure Evaluation (Addendum)
Anesthesia Evaluation  Patient identified by MRN, date of birth, ID band Patient awake    Reviewed: Allergy & Precautions, H&P , NPO status , Patient's Chart, lab work & pertinent test results  Airway Mallampati: IV  TM Distance: >3 FB Neck ROM: Full    Dental no notable dental hx. (+) Teeth Intact, Dental Advisory Given   Pulmonary neg pulmonary ROS,  breath sounds clear to auscultation  Pulmonary exam normal       Cardiovascular hypertension, Pt. on medications Rhythm:Regular Rate:Normal     Neuro/Psych negative neurological ROS  negative psych ROS   GI/Hepatic Neg liver ROS, GERD-  Medicated and Controlled,  Endo/Other  negative endocrine ROS  Renal/GU Renal disease  negative genitourinary   Musculoskeletal   Abdominal   Peds  Hematology negative hematology ROS (+)   Anesthesia Other Findings   Reproductive/Obstetrics negative OB ROS                            Anesthesia Physical Anesthesia Plan  ASA: III  Anesthesia Plan: MAC   Post-op Pain Management:    Induction: Intravenous  Airway Management Planned: Simple Face Mask  Additional Equipment:   Intra-op Plan:   Post-operative Plan:   Informed Consent: I have reviewed the patients History and Physical, chart, labs and discussed the procedure including the risks, benefits and alternatives for the proposed anesthesia with the patient or authorized representative who has indicated his/her understanding and acceptance.   Dental advisory given  Plan Discussed with: CRNA  Anesthesia Plan Comments:         Anesthesia Quick Evaluation

## 2014-03-06 NOTE — Discharge Instructions (Signed)
° ° °  03/06/2014 Subhaan Estep [id number] 10/28/1930  Surgeon(s): Mal Misty, MD  Procedure(s): ARTERIOVENOUS (AV) FISTULA CREATION-right radiocephalic  x Do not stick fistula for 12 weeks   General Anesthesia, Adult, Care After  Refer to this sheet in the next few weeks. These instructions provide you with information on caring for yourself after your procedure. Your health care provider may also give you more specific instructions. Your treatment has been planned according to current medical practices, but problems sometimes occur. Call your health care provider if you have any problems or questions after your procedure.  WHAT TO EXPECT AFTER THE PROCEDURE  After the procedure, it is typical to experience:  Sleepiness.  Nausea and vomiting. HOME CARE INSTRUCTIONS  For the first 24 hours after general anesthesia:  Have a responsible person with you.  Do not drive a car. If you are alone, do not take public transportation.  Do not drink alcohol.  Do not take medicine that has not been prescribed by your health care provider.  Do not sign important papers or make important decisions.  You may resume a normal diet and activities as directed by your health care provider.  Change bandages (dressings) as directed.  If you have questions or problems that seem related to general anesthesia, call the hospital and ask for the anesthetist or anesthesiologist on call. SEEK MEDICAL CARE IF:  You have nausea and vomiting that continue the day after anesthesia.  You develop a rash. SEEK IMMEDIATE MEDICAL CARE IF:  You have difficulty breathing.  You have chest pain.  You have any allergic problems. Document Released: 03/29/2000 Document Revised: 08/23/2012 Document Reviewed: 07/06/2012  [facility] Patient Information 2014 [place], [place].   What to eat:  For your first meals, you should eat lightly; only small meals initially.  If you do not have nausea, you may eat larger meals.  Avoid  spicy, greasy and heavy food.

## 2014-03-07 ENCOUNTER — Encounter (HOSPITAL_COMMUNITY): Payer: Self-pay | Admitting: Vascular Surgery

## 2014-03-07 ENCOUNTER — Telehealth: Payer: Self-pay | Admitting: Vascular Surgery

## 2014-03-07 DIAGNOSIS — D631 Anemia in chronic kidney disease: Secondary | ICD-10-CM | POA: Diagnosis not present

## 2014-03-07 DIAGNOSIS — D509 Iron deficiency anemia, unspecified: Secondary | ICD-10-CM | POA: Diagnosis not present

## 2014-03-07 DIAGNOSIS — N2581 Secondary hyperparathyroidism of renal origin: Secondary | ICD-10-CM | POA: Diagnosis not present

## 2014-03-07 DIAGNOSIS — N184 Chronic kidney disease, stage 4 (severe): Secondary | ICD-10-CM | POA: Diagnosis not present

## 2014-03-07 DIAGNOSIS — N186 End stage renal disease: Secondary | ICD-10-CM | POA: Diagnosis not present

## 2014-03-07 NOTE — Telephone Encounter (Addendum)
-----   Message from Gabriel Earing, [place] sent at 03/06/2014  1:09 PM EST ----- S/p right RC AVF 03/06/14.  F/u with [facility] in 6 weeks with duplex.  Thanks, Samantha  03/07/14: left message for patient, dpm

## 2014-03-08 ENCOUNTER — Encounter: Payer: Self-pay | Admitting: Nephrology

## 2014-03-09 DIAGNOSIS — D509 Iron deficiency anemia, unspecified: Secondary | ICD-10-CM | POA: Diagnosis not present

## 2014-03-09 DIAGNOSIS — D631 Anemia in chronic kidney disease: Secondary | ICD-10-CM | POA: Diagnosis not present

## 2014-03-09 DIAGNOSIS — N186 End stage renal disease: Secondary | ICD-10-CM | POA: Diagnosis not present

## 2014-03-09 DIAGNOSIS — N2581 Secondary hyperparathyroidism of renal origin: Secondary | ICD-10-CM | POA: Diagnosis not present

## 2014-03-09 DIAGNOSIS — N184 Chronic kidney disease, stage 4 (severe): Secondary | ICD-10-CM | POA: Diagnosis not present

## 2014-03-12 DIAGNOSIS — N184 Chronic kidney disease, stage 4 (severe): Secondary | ICD-10-CM | POA: Diagnosis not present

## 2014-03-12 DIAGNOSIS — N2581 Secondary hyperparathyroidism of renal origin: Secondary | ICD-10-CM | POA: Diagnosis not present

## 2014-03-12 DIAGNOSIS — D631 Anemia in chronic kidney disease: Secondary | ICD-10-CM | POA: Diagnosis not present

## 2014-03-12 DIAGNOSIS — D509 Iron deficiency anemia, unspecified: Secondary | ICD-10-CM | POA: Diagnosis not present

## 2014-03-12 DIAGNOSIS — N186 End stage renal disease: Secondary | ICD-10-CM | POA: Diagnosis not present

## 2014-03-14 DIAGNOSIS — N2581 Secondary hyperparathyroidism of renal origin: Secondary | ICD-10-CM | POA: Diagnosis not present

## 2014-03-14 DIAGNOSIS — N184 Chronic kidney disease, stage 4 (severe): Secondary | ICD-10-CM | POA: Diagnosis not present

## 2014-03-14 DIAGNOSIS — D631 Anemia in chronic kidney disease: Secondary | ICD-10-CM | POA: Diagnosis not present

## 2014-03-14 DIAGNOSIS — N186 End stage renal disease: Secondary | ICD-10-CM | POA: Diagnosis not present

## 2014-03-14 DIAGNOSIS — D509 Iron deficiency anemia, unspecified: Secondary | ICD-10-CM | POA: Diagnosis not present

## 2014-03-16 DIAGNOSIS — N186 End stage renal disease: Secondary | ICD-10-CM | POA: Diagnosis not present

## 2014-03-16 DIAGNOSIS — N2581 Secondary hyperparathyroidism of renal origin: Secondary | ICD-10-CM | POA: Diagnosis not present

## 2014-03-16 DIAGNOSIS — D631 Anemia in chronic kidney disease: Secondary | ICD-10-CM | POA: Diagnosis not present

## 2014-03-16 DIAGNOSIS — N184 Chronic kidney disease, stage 4 (severe): Secondary | ICD-10-CM | POA: Diagnosis not present

## 2014-03-16 DIAGNOSIS — D509 Iron deficiency anemia, unspecified: Secondary | ICD-10-CM | POA: Diagnosis not present

## 2014-03-18 ENCOUNTER — Encounter: Payer: Self-pay | Admitting: Internal Medicine

## 2014-03-19 ENCOUNTER — Encounter: Payer: Self-pay | Admitting: Internal Medicine

## 2014-03-19 DIAGNOSIS — D509 Iron deficiency anemia, unspecified: Secondary | ICD-10-CM | POA: Diagnosis not present

## 2014-03-19 DIAGNOSIS — N186 End stage renal disease: Secondary | ICD-10-CM | POA: Diagnosis not present

## 2014-03-19 DIAGNOSIS — N184 Chronic kidney disease, stage 4 (severe): Secondary | ICD-10-CM | POA: Diagnosis not present

## 2014-03-19 DIAGNOSIS — D631 Anemia in chronic kidney disease: Secondary | ICD-10-CM | POA: Diagnosis not present

## 2014-03-19 DIAGNOSIS — N2581 Secondary hyperparathyroidism of renal origin: Secondary | ICD-10-CM | POA: Diagnosis not present

## 2014-03-21 DIAGNOSIS — D509 Iron deficiency anemia, unspecified: Secondary | ICD-10-CM | POA: Diagnosis not present

## 2014-03-21 DIAGNOSIS — D631 Anemia in chronic kidney disease: Secondary | ICD-10-CM | POA: Diagnosis not present

## 2014-03-21 DIAGNOSIS — N186 End stage renal disease: Secondary | ICD-10-CM | POA: Diagnosis not present

## 2014-03-21 DIAGNOSIS — N2581 Secondary hyperparathyroidism of renal origin: Secondary | ICD-10-CM | POA: Diagnosis not present

## 2014-03-21 DIAGNOSIS — N184 Chronic kidney disease, stage 4 (severe): Secondary | ICD-10-CM | POA: Diagnosis not present

## 2014-03-23 DIAGNOSIS — N2581 Secondary hyperparathyroidism of renal origin: Secondary | ICD-10-CM | POA: Diagnosis not present

## 2014-03-23 DIAGNOSIS — D509 Iron deficiency anemia, unspecified: Secondary | ICD-10-CM | POA: Diagnosis not present

## 2014-03-23 DIAGNOSIS — D631 Anemia in chronic kidney disease: Secondary | ICD-10-CM | POA: Diagnosis not present

## 2014-03-23 DIAGNOSIS — N186 End stage renal disease: Secondary | ICD-10-CM | POA: Diagnosis not present

## 2014-03-23 DIAGNOSIS — N184 Chronic kidney disease, stage 4 (severe): Secondary | ICD-10-CM | POA: Diagnosis not present

## 2014-03-25 ENCOUNTER — Encounter: Payer: Self-pay | Admitting: Internal Medicine

## 2014-03-26 DIAGNOSIS — D631 Anemia in chronic kidney disease: Secondary | ICD-10-CM | POA: Diagnosis not present

## 2014-03-26 DIAGNOSIS — N2581 Secondary hyperparathyroidism of renal origin: Secondary | ICD-10-CM | POA: Diagnosis not present

## 2014-03-26 DIAGNOSIS — D509 Iron deficiency anemia, unspecified: Secondary | ICD-10-CM | POA: Diagnosis not present

## 2014-03-26 DIAGNOSIS — N186 End stage renal disease: Secondary | ICD-10-CM | POA: Diagnosis not present

## 2014-03-26 DIAGNOSIS — N184 Chronic kidney disease, stage 4 (severe): Secondary | ICD-10-CM | POA: Diagnosis not present

## 2014-03-28 DIAGNOSIS — D509 Iron deficiency anemia, unspecified: Secondary | ICD-10-CM | POA: Diagnosis not present

## 2014-03-28 DIAGNOSIS — D631 Anemia in chronic kidney disease: Secondary | ICD-10-CM | POA: Diagnosis not present

## 2014-03-28 DIAGNOSIS — N2581 Secondary hyperparathyroidism of renal origin: Secondary | ICD-10-CM | POA: Diagnosis not present

## 2014-03-28 DIAGNOSIS — N184 Chronic kidney disease, stage 4 (severe): Secondary | ICD-10-CM | POA: Diagnosis not present

## 2014-03-28 DIAGNOSIS — N186 End stage renal disease: Secondary | ICD-10-CM | POA: Diagnosis not present

## 2014-03-30 DIAGNOSIS — N184 Chronic kidney disease, stage 4 (severe): Secondary | ICD-10-CM | POA: Diagnosis not present

## 2014-03-30 DIAGNOSIS — N2581 Secondary hyperparathyroidism of renal origin: Secondary | ICD-10-CM | POA: Diagnosis not present

## 2014-03-30 DIAGNOSIS — D631 Anemia in chronic kidney disease: Secondary | ICD-10-CM | POA: Diagnosis not present

## 2014-03-30 DIAGNOSIS — D509 Iron deficiency anemia, unspecified: Secondary | ICD-10-CM | POA: Diagnosis not present

## 2014-03-30 DIAGNOSIS — N186 End stage renal disease: Secondary | ICD-10-CM | POA: Diagnosis not present

## 2014-04-02 DIAGNOSIS — N186 End stage renal disease: Secondary | ICD-10-CM | POA: Diagnosis not present

## 2014-04-02 DIAGNOSIS — N184 Chronic kidney disease, stage 4 (severe): Secondary | ICD-10-CM | POA: Diagnosis not present

## 2014-04-02 DIAGNOSIS — N2581 Secondary hyperparathyroidism of renal origin: Secondary | ICD-10-CM | POA: Diagnosis not present

## 2014-04-02 DIAGNOSIS — D509 Iron deficiency anemia, unspecified: Secondary | ICD-10-CM | POA: Diagnosis not present

## 2014-04-02 DIAGNOSIS — D631 Anemia in chronic kidney disease: Secondary | ICD-10-CM | POA: Diagnosis not present

## 2014-04-03 ENCOUNTER — Other Ambulatory Visit: Payer: Self-pay

## 2014-04-04 DIAGNOSIS — N2889 Other specified disorders of kidney and ureter: Secondary | ICD-10-CM | POA: Diagnosis not present

## 2014-04-04 DIAGNOSIS — D509 Iron deficiency anemia, unspecified: Secondary | ICD-10-CM | POA: Diagnosis not present

## 2014-04-04 DIAGNOSIS — Z992 Dependence on renal dialysis: Secondary | ICD-10-CM | POA: Diagnosis not present

## 2014-04-04 DIAGNOSIS — N186 End stage renal disease: Secondary | ICD-10-CM | POA: Diagnosis not present

## 2014-04-04 DIAGNOSIS — N2581 Secondary hyperparathyroidism of renal origin: Secondary | ICD-10-CM | POA: Diagnosis not present

## 2014-04-04 DIAGNOSIS — D631 Anemia in chronic kidney disease: Secondary | ICD-10-CM | POA: Diagnosis not present

## 2014-04-04 DIAGNOSIS — N184 Chronic kidney disease, stage 4 (severe): Secondary | ICD-10-CM | POA: Diagnosis not present

## 2014-04-06 DIAGNOSIS — N186 End stage renal disease: Secondary | ICD-10-CM | POA: Diagnosis not present

## 2014-04-06 DIAGNOSIS — N184 Chronic kidney disease, stage 4 (severe): Secondary | ICD-10-CM | POA: Diagnosis not present

## 2014-04-06 DIAGNOSIS — D631 Anemia in chronic kidney disease: Secondary | ICD-10-CM | POA: Diagnosis not present

## 2014-04-06 DIAGNOSIS — D509 Iron deficiency anemia, unspecified: Secondary | ICD-10-CM | POA: Diagnosis not present

## 2014-04-06 DIAGNOSIS — N2581 Secondary hyperparathyroidism of renal origin: Secondary | ICD-10-CM | POA: Diagnosis not present

## 2014-04-08 ENCOUNTER — Other Ambulatory Visit: Payer: Self-pay | Admitting: *Deleted

## 2014-04-08 MED ORDER — ALLOPURINOL 100 MG PO TABS: 100.0000 mg | ORAL_TABLET | Freq: Every day | ORAL | Status: DC

## 2014-04-08 NOTE — Telephone Encounter (Signed)
Patient requested to be faxed to [facility]

## 2014-04-09 DIAGNOSIS — D631 Anemia in chronic kidney disease: Secondary | ICD-10-CM | POA: Diagnosis not present

## 2014-04-09 DIAGNOSIS — N2581 Secondary hyperparathyroidism of renal origin: Secondary | ICD-10-CM | POA: Diagnosis not present

## 2014-04-09 DIAGNOSIS — D509 Iron deficiency anemia, unspecified: Secondary | ICD-10-CM | POA: Diagnosis not present

## 2014-04-09 DIAGNOSIS — N186 End stage renal disease: Secondary | ICD-10-CM | POA: Diagnosis not present

## 2014-04-09 DIAGNOSIS — N184 Chronic kidney disease, stage 4 (severe): Secondary | ICD-10-CM | POA: Diagnosis not present

## 2014-04-11 DIAGNOSIS — N2581 Secondary hyperparathyroidism of renal origin: Secondary | ICD-10-CM | POA: Diagnosis not present

## 2014-04-11 DIAGNOSIS — N186 End stage renal disease: Secondary | ICD-10-CM | POA: Diagnosis not present

## 2014-04-11 DIAGNOSIS — N184 Chronic kidney disease, stage 4 (severe): Secondary | ICD-10-CM | POA: Diagnosis not present

## 2014-04-11 DIAGNOSIS — D631 Anemia in chronic kidney disease: Secondary | ICD-10-CM | POA: Diagnosis not present

## 2014-04-11 DIAGNOSIS — D509 Iron deficiency anemia, unspecified: Secondary | ICD-10-CM | POA: Diagnosis not present

## 2014-04-13 DIAGNOSIS — N2581 Secondary hyperparathyroidism of renal origin: Secondary | ICD-10-CM | POA: Diagnosis not present

## 2014-04-13 DIAGNOSIS — N186 End stage renal disease: Secondary | ICD-10-CM | POA: Diagnosis not present

## 2014-04-13 DIAGNOSIS — D631 Anemia in chronic kidney disease: Secondary | ICD-10-CM | POA: Diagnosis not present

## 2014-04-13 DIAGNOSIS — N184 Chronic kidney disease, stage 4 (severe): Secondary | ICD-10-CM | POA: Diagnosis not present

## 2014-04-13 DIAGNOSIS — D509 Iron deficiency anemia, unspecified: Secondary | ICD-10-CM | POA: Diagnosis not present

## 2014-04-16 DIAGNOSIS — D509 Iron deficiency anemia, unspecified: Secondary | ICD-10-CM | POA: Diagnosis not present

## 2014-04-16 DIAGNOSIS — N186 End stage renal disease: Secondary | ICD-10-CM | POA: Diagnosis not present

## 2014-04-16 DIAGNOSIS — D631 Anemia in chronic kidney disease: Secondary | ICD-10-CM | POA: Diagnosis not present

## 2014-04-16 DIAGNOSIS — N2581 Secondary hyperparathyroidism of renal origin: Secondary | ICD-10-CM | POA: Diagnosis not present

## 2014-04-16 DIAGNOSIS — N184 Chronic kidney disease, stage 4 (severe): Secondary | ICD-10-CM | POA: Diagnosis not present

## 2014-04-18 DIAGNOSIS — N184 Chronic kidney disease, stage 4 (severe): Secondary | ICD-10-CM | POA: Diagnosis not present

## 2014-04-18 DIAGNOSIS — D631 Anemia in chronic kidney disease: Secondary | ICD-10-CM | POA: Diagnosis not present

## 2014-04-18 DIAGNOSIS — D509 Iron deficiency anemia, unspecified: Secondary | ICD-10-CM | POA: Diagnosis not present

## 2014-04-18 DIAGNOSIS — N2581 Secondary hyperparathyroidism of renal origin: Secondary | ICD-10-CM | POA: Diagnosis not present

## 2014-04-18 DIAGNOSIS — N186 End stage renal disease: Secondary | ICD-10-CM | POA: Diagnosis not present

## 2014-04-20 DIAGNOSIS — D631 Anemia in chronic kidney disease: Secondary | ICD-10-CM | POA: Diagnosis not present

## 2014-04-20 DIAGNOSIS — N186 End stage renal disease: Secondary | ICD-10-CM | POA: Diagnosis not present

## 2014-04-20 DIAGNOSIS — D509 Iron deficiency anemia, unspecified: Secondary | ICD-10-CM | POA: Diagnosis not present

## 2014-04-20 DIAGNOSIS — N2581 Secondary hyperparathyroidism of renal origin: Secondary | ICD-10-CM | POA: Diagnosis not present

## 2014-04-20 DIAGNOSIS — N184 Chronic kidney disease, stage 4 (severe): Secondary | ICD-10-CM | POA: Diagnosis not present

## 2014-04-22 ENCOUNTER — Encounter: Payer: Self-pay | Admitting: Vascular Surgery

## 2014-04-22 ENCOUNTER — Other Ambulatory Visit: Payer: Self-pay | Admitting: *Deleted

## 2014-04-22 DIAGNOSIS — N186 End stage renal disease: Secondary | ICD-10-CM

## 2014-04-22 DIAGNOSIS — Z4931 Encounter for adequacy testing for hemodialysis: Secondary | ICD-10-CM

## 2014-04-23 ENCOUNTER — Encounter: Payer: Self-pay | Admitting: Vascular Surgery

## 2014-04-23 ENCOUNTER — Other Ambulatory Visit (HOSPITAL_COMMUNITY): Payer: Self-pay

## 2014-04-23 ENCOUNTER — Ambulatory Visit (HOSPITAL_COMMUNITY)
Admission: RE | Admit: 2014-04-23 | Discharge: 2014-04-23 | Disposition: A | Discharge status: Home / Self Care | Payer: Medicare Other | Source: Ambulatory Visit | Attending: Vascular Surgery | Admitting: Vascular Surgery

## 2014-04-23 ENCOUNTER — Ambulatory Visit (INDEPENDENT_AMBULATORY_CARE_PROVIDER_SITE_OTHER): Payer: Self-pay | Admitting: Vascular Surgery

## 2014-04-23 VITALS — BP 108/70 | HR 66 | Temp 97.9°F | Resp 14 | Ht 69.5 in | Wt 173.0 lb

## 2014-04-23 DIAGNOSIS — N184 Chronic kidney disease, stage 4 (severe): Secondary | ICD-10-CM | POA: Diagnosis not present

## 2014-04-23 DIAGNOSIS — Z4931 Encounter for adequacy testing for hemodialysis: Secondary | ICD-10-CM | POA: Diagnosis not present

## 2014-04-23 DIAGNOSIS — D509 Iron deficiency anemia, unspecified: Secondary | ICD-10-CM | POA: Diagnosis not present

## 2014-04-23 DIAGNOSIS — D631 Anemia in chronic kidney disease: Secondary | ICD-10-CM | POA: Diagnosis not present

## 2014-04-23 DIAGNOSIS — N186 End stage renal disease: Secondary | ICD-10-CM

## 2014-04-23 DIAGNOSIS — N2581 Secondary hyperparathyroidism of renal origin: Secondary | ICD-10-CM | POA: Diagnosis not present

## 2014-04-23 NOTE — Progress Notes (Signed)
Subjective:     Patient ID: Eric [name], male   DOB: October 19, 1930, 81 y.o.   MRN: [id number]  HPI this 81 year old male returns for initial follow-up regarding his right radial-cephalic AV fistula created 03/06/2014. He is on hemodialysis on Tuesday Thursday and Saturday. He has had no pain or numbness in the right hand. He has had 2 previous failed fistulas in the left upper extremity. Review of Systems     Objective:   Physical Exam BP 108/70 mmHg  Pulse 66  Temp(Src) 97.9 F (36.6 C) (Oral)  Resp 14  Ht 5' 9.5" (1.765 m)  Wt 173 lb (78.472 kg)  BMI 25.19 kg/m2  SpO2 100%  . Gen. well-developed well-nourished male no apparent stress alert and oriented 3 Right upper extremity with palpable pulse and thrill over radial-cephalic AV fistula. Audible bruit with stethoscope up to antecubital fossa.  Today I ordered a ultrasound of the radial cephalic fistula in the right upper extremity. It is widely patent with no evidence of stenosis. There are 2 competing branches which are not large-0.28 cm about midway between the wrist and antecubital area.  Visualize these with the sono site ultrasound at the bedside.     Assessment:     End-stage renal disease with patent right radial-cephalic AV fistula. Patient does have 2 competing branches coming off at the same location in the mid forearm I discussed possible ligation of these branches with the patient and he does not want to proceed with that at this time I encouraged him to discuss this with Dr. Clair Gulling Deterding    Plan:     He will discuss the fistula with his nephrologist and if decision is made to ligate these branches I would be happy to proceed with that. Patient stated he would rather wait until going on dialysis to see how the flows are prior to having the branches ligated. I discussed with him the fact that the branches were ligated now they would have 6 more weeks for the fistula to mature. Fistula cannot be utilized until early  May. If patient decides to proceed with ligation of these 2 branches we will be happy to schedule him at anytime

## 2014-04-25 DIAGNOSIS — D509 Iron deficiency anemia, unspecified: Secondary | ICD-10-CM | POA: Diagnosis not present

## 2014-04-25 DIAGNOSIS — D631 Anemia in chronic kidney disease: Secondary | ICD-10-CM | POA: Diagnosis not present

## 2014-04-25 DIAGNOSIS — N2581 Secondary hyperparathyroidism of renal origin: Secondary | ICD-10-CM | POA: Diagnosis not present

## 2014-04-25 DIAGNOSIS — N184 Chronic kidney disease, stage 4 (severe): Secondary | ICD-10-CM | POA: Diagnosis not present

## 2014-04-25 DIAGNOSIS — N186 End stage renal disease: Secondary | ICD-10-CM | POA: Diagnosis not present

## 2014-04-27 DIAGNOSIS — D509 Iron deficiency anemia, unspecified: Secondary | ICD-10-CM | POA: Diagnosis not present

## 2014-04-27 DIAGNOSIS — N2581 Secondary hyperparathyroidism of renal origin: Secondary | ICD-10-CM | POA: Diagnosis not present

## 2014-04-27 DIAGNOSIS — D631 Anemia in chronic kidney disease: Secondary | ICD-10-CM | POA: Diagnosis not present

## 2014-04-27 DIAGNOSIS — N184 Chronic kidney disease, stage 4 (severe): Secondary | ICD-10-CM | POA: Diagnosis not present

## 2014-04-27 DIAGNOSIS — N186 End stage renal disease: Secondary | ICD-10-CM | POA: Diagnosis not present

## 2014-04-30 ENCOUNTER — Encounter: Payer: Self-pay | Admitting: Internal Medicine

## 2014-04-30 ENCOUNTER — Non-Acute Institutional Stay: Payer: Medicare Other | Admitting: Internal Medicine

## 2014-04-30 VITALS — BP 102/62 | HR 60 | Temp 97.5°F | Wt 172.0 lb

## 2014-04-30 DIAGNOSIS — D509 Iron deficiency anemia, unspecified: Secondary | ICD-10-CM | POA: Diagnosis not present

## 2014-04-30 DIAGNOSIS — K219 Gastro-esophageal reflux disease without esophagitis: Secondary | ICD-10-CM | POA: Diagnosis not present

## 2014-04-30 DIAGNOSIS — N2581 Secondary hyperparathyroidism of renal origin: Secondary | ICD-10-CM | POA: Diagnosis not present

## 2014-04-30 DIAGNOSIS — Z992 Dependence on renal dialysis: Secondary | ICD-10-CM

## 2014-04-30 DIAGNOSIS — N186 End stage renal disease: Secondary | ICD-10-CM

## 2014-04-30 DIAGNOSIS — E785 Hyperlipidemia, unspecified: Secondary | ICD-10-CM | POA: Diagnosis not present

## 2014-04-30 DIAGNOSIS — L84 Corns and callosities: Secondary | ICD-10-CM | POA: Diagnosis not present

## 2014-04-30 DIAGNOSIS — M1A3790 Chronic gout due to renal impairment, unspecified ankle and foot, without tophus (tophi): Secondary | ICD-10-CM | POA: Diagnosis not present

## 2014-04-30 DIAGNOSIS — D631 Anemia in chronic kidney disease: Secondary | ICD-10-CM | POA: Diagnosis not present

## 2014-04-30 DIAGNOSIS — G4700 Insomnia, unspecified: Secondary | ICD-10-CM | POA: Diagnosis not present

## 2014-04-30 DIAGNOSIS — N184 Chronic kidney disease, stage 4 (severe): Secondary | ICD-10-CM | POA: Diagnosis not present

## 2014-04-30 NOTE — Progress Notes (Signed)
Patient ID: Eric [name], male   DOB: 06/30/1930, 81 y.o.   MRN: [id number]   Location:  [facility]  Code Status: DNR  Goals of Care: Advanced Directive information Does patient have an advance directive?: Yes, Type of Advance Directive: Doon;Living will, Does patient want to make changes to advanced directive?: No - Patient declined   No Known Allergies  Chief Complaint  Patient presents with  . Medical Management of Chronic Issues    blood pressure, insomnia, GERD, ESRD on dialysis.  Had dialysis today.   . Sleeping Problem    Restoril didn't help    HPI: Patient is a 81 y.o. white male seen in the office today for med mgt of chronic diseases.    His biggest concern is difficult with sleep.  Cannot sleep before HD and sometimes after.  Tried restoril which didn't work.  Using melatonin 10mg  which may be helping--unclear.   Is always anxious before dialysis.  Has been going for 11 mos.  Gets through port now.  Is frustrated that he cannot shower with the port.  Lorazepam did not help either.    AV fistula is bifurcating in the arm.  Asks about the surgery to tie off.    Has corn on his foot. Left 5th toe very small.  Discussed otc remedies.  No chest pains, shortness of breath with HD.    May have had one gout flare up since last appt.  Went away spontaneously.    Had an episode of acid reflux.  Uses omeprazole only 4 x per week and may miss more than that.    Doesn't like being 84 and not being able to do what he used to do.  Denies depression though  Bowels haven't been regular since he had shingles.  No longer goes just daily.  No postherpetic neuralgia.    Phos level is down with binders he says on today's labs at HD.  Review of Systems:  Review of Systems  Constitutional: Positive for malaise/fatigue. Negative for fever and chills.  HENT: Positive for congestion.        Since moving here from [facility] and [facility]  Eyes: Negative for blurred  vision.  Respiratory: Negative for shortness of breath.   Cardiovascular: Negative for chest pain.  Gastrointestinal: Negative for abdominal pain, blood in stool and melena.       No longer has regular BMs  Genitourinary: Negative for dysuria.  Musculoskeletal: Negative for falls.  Skin:       Corn on left 5th toe  Neurological: Negative for dizziness.  Psychiatric/Behavioral: Negative for depression and memory loss. The patient has insomnia.     Past Medical History  Diagnosis Date  . CKD (chronic kidney disease) stage 5, GFR less than 15 ml/min   . Unspecified essential hypertension   . GERD (gastroesophageal reflux disease)   . Gout   . Herpes zoster 04/2012  . Complication of anesthesia     " one time my heart stopped due to a medication that I was on."   . Heart murmur     as a teen    Past Surgical History  Procedure Laterality Date  . Appendectomy  1944  . Cataract extraction w/ intraocular lens implant Bilateral 2014    [place]  . Dialysis fistula creation  08/04/2012 and 10/13    Dr. Harden Mo  . Colonoscopy  2013    Dr. Annamaria Helling [place], [place].  Marland Kitchen Av fistula  placement Left     Left Cimino AVF placed in [place]   . Insertion of dialysis catheter Right 01-15-14    Right chest TDC placed by Dr. Augustin Coupe at [facility]  . Eye surgery      Cataract  . Av fistula placement Right 03/06/2014    Procedure: ARTERIOVENOUS (AV) FISTULA CREATION-right radiocephalic;  Surgeon: Mal Misty, MD;  Location: [facility];  Service: Vascular;  Laterality: Right;    Social History:   reports that he has never smoked. He has never used smokeless tobacco. He reports that he drinks about 1.2 oz of alcohol per week. He reports that he does not use illicit drugs.  Family History  Problem Relation Age of Onset  . Cancer Father     lung    Medications: Patient's Medications  New Prescriptions   No medications on file  Previous Medications   ALLOPURINOL (ZYLOPRIM) 100  MG TABLET    Take 1 tablet (100 mg total) by mouth daily.   AMLODIPINE (NORVASC) 10 MG TABLET    Take one tablet by mouth once daily to control blood pressure   ASPIRIN EC 81 MG TABLET    Take 81 mg by mouth daily.   ATORVASTATIN (LIPITOR) 20 MG TABLET    Take 1 tablet (20 mg total) by mouth daily.   MELATONIN 10 MG TABS    Take by mouth. Take one tablet at bedtime   MULTIVITAMIN (RENA-VIT) TABS TABLET    Take 1 tablet by mouth daily. Take one tablet daily   OMEPRAZOLE (PRILOSEC) 20 MG CAPSULE    Take 20 mg by mouth 4 (four) times a week. Sunday, Tuesday, Thursday and Saturday   VITAMIN D, ERGOCALCIFEROL, (DRISDOL) 50000 UNITS CAPS CAPSULE    Take 50,000 Units by mouth every 14 (fourteen) days. 1st and 15th of each month  Modified Medications   No medications on file  Discontinued Medications   MELATONIN 3 MG TABS    One at bedtimes   OXYCODONE (ROXICODONE) 5 MG IMMEDIATE RELEASE TABLET    Take 1 tablet (5 mg total) by mouth every 8 (eight) hours as needed.   TEMAZEPAM (RESTORIL) 15 MG CAPSULE    Take 15 mg by mouth every Monday, Wednesday, and Friday at 8 PM. Take on the night before dialysis     Physical Exam: Filed Vitals:   04/30/14 1531  BP: 102/62  Pulse: 60  Temp: 97.5 F (36.4 C)  TempSrc: Oral  Weight: 172 lb (78.019 kg)  SpO2: 97%  Physical Exam  Constitutional: He is oriented to person, place, and time. He appears well-developed and well-nourished. No distress.  HENT:  Head: Normocephalic and atraumatic.  Cardiovascular: Normal rate, regular rhythm, normal heart sounds and intact distal pulses.   Right forearm AV fistula with normal pulse and thrill  Pulmonary/Chest: Effort normal and breath sounds normal.  Musculoskeletal: Normal range of motion.  Neurological: He is alert and oriented to person, place, and time.  Skin: Skin is warm and dry.  Pinpoint corn on left 5th toe  Psychiatric:  Flat affect     Labs reviewed: Basic Metabolic Panel:  Recent Labs   05/17/13 01/24/14 03/01/14 03/06/14 0958  NA 134* 139 140 138  K 4.0 4.6 4.7 4.5  GLUCOSE  --   --   --  115*  BUN 98*  --   --   --   CREATININE 7.9* 6.5* 6.9*  --    Liver Function Tests:  Recent Labs  05/17/13  AST 24  ALT 19  ALKPHOS 50   No results for input(s): LIPASE, AMYLASE in the last 8760 hours. No results for input(s): AMMONIA in the last 8760 hours. CBC:  Recent Labs  05/17/13 01/24/14 03/01/14 03/06/14 0958  WBC 4.9 5.8 4.5  --   HGB 9.9* 10.3* 11.1* 12.6*  HCT 28* 31* 34* 37.0*  PLT 147* 225  --   --    Lipid Panel: No results for input(s): CHOL, HDL, LDLCALC, TRIG, CHOLHDL, LDLDIRECT in the last 8760 hours. No results found for: HGBA1C   Assessment/Plan 1. ESRD on dialysis -is now taking his phos binder properly and levels down today -has new AV fistula in right arm, but bifurcating so I agreed one vessel should be tied off for it to work its best though he was hoping I would say he didn't have to have another surgery -advised that vascular surgery and nephrology are in better positions to give that opinion  2. Gastroesophageal reflux disease without esophagitis -cont omeprazole a few days a week as he currently takes it due to missing breakfast on HD days  3. Corn of toe -recommended he use otc corn removal pad for the corn--I do not want to create a wound on his foot--if the otc method is ineffective, he can see podiatry here  4. Chronic gout due to renal impairment involving foot without tophus, unspecified laterality -cont allopurinol--had one episode in his toe that he can recall, but none recently  5. Hyperlipidemia -cont statin therapy  6. Insomnia -discussed that sedative/hypnotic meds are not recommended for his age group due to confusion, falls, sleepwalking, etc. -restoril was ineffective anyway as was lorazepam -will continue with melatonin 10mg  as needed for sleep  Labs/tests ordered: none--gets at HD Next appt:  6 mos  Eric [name]  L. Orvis Stann, D.O. [facility] [address], [place] [address] Cell Phone (Mon-Fri 8am-5pm):  [phone] On Call:  [phone] & follow prompts after 5pm & weekends Office Phone:  [phone] Office Fax:  [phone]

## 2014-05-02 ENCOUNTER — Other Ambulatory Visit: Payer: Self-pay

## 2014-05-02 DIAGNOSIS — N184 Chronic kidney disease, stage 4 (severe): Secondary | ICD-10-CM | POA: Diagnosis not present

## 2014-05-02 DIAGNOSIS — D631 Anemia in chronic kidney disease: Secondary | ICD-10-CM | POA: Diagnosis not present

## 2014-05-02 DIAGNOSIS — N2581 Secondary hyperparathyroidism of renal origin: Secondary | ICD-10-CM | POA: Diagnosis not present

## 2014-05-02 DIAGNOSIS — N186 End stage renal disease: Secondary | ICD-10-CM | POA: Diagnosis not present

## 2014-05-02 DIAGNOSIS — D509 Iron deficiency anemia, unspecified: Secondary | ICD-10-CM | POA: Diagnosis not present

## 2014-05-04 DIAGNOSIS — D509 Iron deficiency anemia, unspecified: Secondary | ICD-10-CM | POA: Diagnosis not present

## 2014-05-04 DIAGNOSIS — N186 End stage renal disease: Secondary | ICD-10-CM | POA: Diagnosis not present

## 2014-05-04 DIAGNOSIS — N2581 Secondary hyperparathyroidism of renal origin: Secondary | ICD-10-CM | POA: Diagnosis not present

## 2014-05-04 DIAGNOSIS — N2889 Other specified disorders of kidney and ureter: Secondary | ICD-10-CM | POA: Diagnosis not present

## 2014-05-04 DIAGNOSIS — N184 Chronic kidney disease, stage 4 (severe): Secondary | ICD-10-CM | POA: Diagnosis not present

## 2014-05-04 DIAGNOSIS — D631 Anemia in chronic kidney disease: Secondary | ICD-10-CM | POA: Diagnosis not present

## 2014-05-04 DIAGNOSIS — Z992 Dependence on renal dialysis: Secondary | ICD-10-CM | POA: Diagnosis not present

## 2014-05-07 ENCOUNTER — Encounter (HOSPITAL_COMMUNITY): Payer: Self-pay | Admitting: *Deleted

## 2014-05-07 DIAGNOSIS — D509 Iron deficiency anemia, unspecified: Secondary | ICD-10-CM | POA: Diagnosis not present

## 2014-05-07 DIAGNOSIS — N186 End stage renal disease: Secondary | ICD-10-CM | POA: Diagnosis not present

## 2014-05-07 DIAGNOSIS — N2581 Secondary hyperparathyroidism of renal origin: Secondary | ICD-10-CM | POA: Diagnosis not present

## 2014-05-07 DIAGNOSIS — N184 Chronic kidney disease, stage 4 (severe): Secondary | ICD-10-CM | POA: Diagnosis not present

## 2014-05-07 DIAGNOSIS — D631 Anemia in chronic kidney disease: Secondary | ICD-10-CM | POA: Diagnosis not present

## 2014-05-07 MED ORDER — SODIUM CHLORIDE 0.9 % IV SOLN
INTRAVENOUS | Status: DC
  Administered 2014-05-08: 10:00:00 via INTRAVENOUS

## 2014-05-07 MED ORDER — DEXTROSE 5 % IV SOLN
1.5000 g | INTRAVENOUS | Status: AC
  Administered 2014-05-08: 1.5 g via INTRAVENOUS
  Filled 2014-05-07: qty 1.5

## 2014-05-07 NOTE — Progress Notes (Signed)
Pt unable to do pre-op call, he was at dinner and then going to a concert. He stated that he was just here about 6 weeks ago and nothing has changed. I told him that I would call him back and asked that he not answer so I could leave pre-op instructions on his voicemail. He states that would be great. I called his voicemail and instructed pt to be here at 8:45 AM tomorrow morning, NPO after midnight tonight, to take Amlodipine and Aspirin in the AM with a small sip of water. Reminded him that he would need a responsible adult to be with him 24 hours after surgery.

## 2014-05-08 ENCOUNTER — Ambulatory Visit (HOSPITAL_COMMUNITY): Payer: Medicare Other | Admitting: Anesthesiology

## 2014-05-08 ENCOUNTER — Ambulatory Visit (HOSPITAL_COMMUNITY)
Admission: RE | Admit: 2014-05-08 | Discharge: 2014-05-08 | Disposition: A | Discharge status: Home / Self Care | Payer: Medicare Other | Source: Ambulatory Visit | Attending: Vascular Surgery | Admitting: Vascular Surgery

## 2014-05-08 ENCOUNTER — Encounter (HOSPITAL_COMMUNITY)
Admission: RE | Disposition: A | Discharge status: Home / Self Care | Payer: Self-pay | Source: Ambulatory Visit | Attending: Vascular Surgery

## 2014-05-08 ENCOUNTER — Encounter (HOSPITAL_COMMUNITY): Payer: Self-pay | Admitting: *Deleted

## 2014-05-08 DIAGNOSIS — M1990 Unspecified osteoarthritis, unspecified site: Secondary | ICD-10-CM | POA: Insufficient documentation

## 2014-05-08 DIAGNOSIS — I120 Hypertensive chronic kidney disease with stage 5 chronic kidney disease or end stage renal disease: Secondary | ICD-10-CM | POA: Insufficient documentation

## 2014-05-08 DIAGNOSIS — T82898A Other specified complication of vascular prosthetic devices, implants and grafts, initial encounter: Secondary | ICD-10-CM | POA: Diagnosis not present

## 2014-05-08 DIAGNOSIS — Z992 Dependence on renal dialysis: Secondary | ICD-10-CM | POA: Insufficient documentation

## 2014-05-08 DIAGNOSIS — N186 End stage renal disease: Secondary | ICD-10-CM | POA: Diagnosis not present

## 2014-05-08 DIAGNOSIS — K219 Gastro-esophageal reflux disease without esophagitis: Secondary | ICD-10-CM | POA: Insufficient documentation

## 2014-05-08 HISTORY — PX: LIGATION OF COMPETING BRANCHES OF ARTERIOVENOUS FISTULA: SHX5949

## 2014-05-08 HISTORY — DX: Unspecified osteoarthritis, unspecified site: M19.90

## 2014-05-08 HISTORY — DX: Reserved for inherently not codable concepts without codable children: IMO0001

## 2014-05-08 HISTORY — DX: Anxiety disorder, unspecified: F41.9

## 2014-05-08 LAB — POCT I-STAT 4, (NA,K, GLUC, HGB,HCT)
GLUCOSE: 101 mg/dL — AB (ref 70–99)
HEMATOCRIT: 38 % — AB (ref 39.0–52.0)
Hemoglobin: 12.9 g/dL — ABNORMAL LOW (ref 13.0–17.0)
Potassium: 4.7 mmol/L (ref 3.5–5.1)
Sodium: 138 mmol/L (ref 135–145)

## 2014-05-08 SURGERY — LIGATION OF COMPETING BRANCHES OF ARTERIOVENOUS FISTULA
Anesthesia: Monitor Anesthesia Care | Site: Arm Lower | Laterality: Right

## 2014-05-08 MED ORDER — FENTANYL CITRATE (PF) 250 MCG/5ML IJ SOLN
INTRAMUSCULAR | Status: AC
  Filled 2014-05-08: qty 5

## 2014-05-08 MED ORDER — LIDOCAINE HCL (CARDIAC) 20 MG/ML IV SOLN
INTRAVENOUS | Status: DC | PRN
  Administered 2014-05-08: 50 mg via INTRAVENOUS

## 2014-05-08 MED ORDER — SODIUM CHLORIDE 0.9 % IV SOLN
INTRAVENOUS | Status: DC | PRN
  Administered 2014-05-08: 12:00:00 via INTRAVENOUS

## 2014-05-08 MED ORDER — PROPOFOL 10 MG/ML IV BOLUS
INTRAVENOUS | Status: DC | PRN
  Administered 2014-05-08: 10 mg via INTRAVENOUS
  Administered 2014-05-08: 20 mg via INTRAVENOUS

## 2014-05-08 MED ORDER — MIDAZOLAM HCL 2 MG/2ML IJ SOLN
INTRAMUSCULAR | Status: AC
  Filled 2014-05-08: qty 2

## 2014-05-08 MED ORDER — LIDOCAINE-EPINEPHRINE (PF) 1 %-1:200000 IJ SOLN
INTRAMUSCULAR | Status: DC | PRN
  Administered 2014-05-08: 4 mL

## 2014-05-08 MED ORDER — CHLORHEXIDINE GLUCONATE CLOTH 2 % EX PADS: 6.0000 | MEDICATED_PAD | Freq: Once | CUTANEOUS | Status: DC

## 2014-05-08 MED ORDER — 0.9 % SODIUM CHLORIDE (POUR BTL) OPTIME
TOPICAL | Status: DC | PRN
  Administered 2014-05-08: 1000 mL

## 2014-05-08 MED ORDER — LIDOCAINE-EPINEPHRINE (PF) 1 %-1:200000 IJ SOLN
INTRAMUSCULAR | Status: AC
  Filled 2014-05-08: qty 10

## 2014-05-08 MED ORDER — FENTANYL CITRATE (PF) 100 MCG/2ML IJ SOLN
INTRAMUSCULAR | Status: DC | PRN
  Administered 2014-05-08: 50 ug via INTRAVENOUS

## 2014-05-08 MED ORDER — MIDAZOLAM HCL 5 MG/5ML IJ SOLN
INTRAMUSCULAR | Status: DC | PRN
  Administered 2014-05-08: 2 mg via INTRAVENOUS

## 2014-05-08 MED ORDER — OXYCODONE-ACETAMINOPHEN 5-325 MG PO TABS: 1.0000 | ORAL_TABLET | Freq: Four times a day (QID) | ORAL | Status: DC | PRN

## 2014-05-08 SURGICAL SUPPLY — 28 items
CANISTER SUCTION 2500CC (MISCELLANEOUS) ×2 IMPLANT
CLIP TI MEDIUM 6 (CLIP) IMPLANT
CLIP TI WIDE RED SMALL 6 (CLIP) IMPLANT
ELECT REM PT RETURN 9FT ADLT (ELECTROSURGICAL) ×2
ELECTRODE REM PT RTRN 9FT ADLT (ELECTROSURGICAL) ×1 IMPLANT
GAUZE SPONGE 4X4 12PLY STRL (GAUZE/BANDAGES/DRESSINGS) ×2 IMPLANT
GEL ULTRASOUND 20GR AQUASONIC (MISCELLANEOUS) IMPLANT
GLOVE BIOGEL PI IND STRL 6.5 (GLOVE) ×1 IMPLANT
GLOVE BIOGEL PI INDICATOR 6.5 (GLOVE) ×1
GLOVE SS BIOGEL STRL SZ 7 (GLOVE) ×1 IMPLANT
GLOVE SUPERSENSE BIOGEL SZ 7 (GLOVE) ×1
GLOVE SURG SS PI 6.5 STRL IVOR (GLOVE) ×4 IMPLANT
GOWN STRL REUS W/ TWL LRG LVL3 (GOWN DISPOSABLE) ×3 IMPLANT
GOWN STRL REUS W/TWL LRG LVL3 (GOWN DISPOSABLE) ×3
KIT BASIN OR (CUSTOM PROCEDURE TRAY) ×2 IMPLANT
KIT ROOM TURNOVER OR (KITS) ×2 IMPLANT
LIQUID BAND (GAUZE/BANDAGES/DRESSINGS) ×2 IMPLANT
NS IRRIG 1000ML POUR BTL (IV SOLUTION) ×2 IMPLANT
PACK CV ACCESS (CUSTOM PROCEDURE TRAY) ×2 IMPLANT
PAD ARMBOARD 7.5X6 YLW CONV (MISCELLANEOUS) ×4 IMPLANT
SUT PROLENE 6 0 BV (SUTURE) ×2 IMPLANT
SUT SILK 0 TIES 10X30 (SUTURE) ×2 IMPLANT
SUT VIC AB 3-0 SH 27 (SUTURE) ×1
SUT VIC AB 3-0 SH 27X BRD (SUTURE) ×1 IMPLANT
SWAB COLLECTION DEVICE MRSA (MISCELLANEOUS) IMPLANT
TUBE ANAEROBIC SPECIMEN COL (MISCELLANEOUS) IMPLANT
UNDERPAD 30X30 INCONTINENT (UNDERPADS AND DIAPERS) ×2 IMPLANT
WATER STERILE IRR 1000ML POUR (IV SOLUTION) ×2 IMPLANT

## 2014-05-08 NOTE — H&P (View-Only) (Signed)
Subjective:     Patient ID: Eric [name], male   DOB: August 11, 1930, 81 y.o.   MRN: [id number]  HPI this 81 year old male returns for initial follow-up regarding his right radial-cephalic AV fistula created 03/06/2014. He is on hemodialysis on Tuesday Thursday and Saturday. He has had no pain or numbness in the right hand. He has had 2 previous failed fistulas in the left upper extremity. Review of Systems     Objective:   Physical Exam BP 108/70 mmHg  Pulse 66  Temp(Src) 97.9 F (36.6 C) (Oral)  Resp 14  Ht 5' 9.5" (1.765 m)  Wt 173 lb (78.472 kg)  BMI 25.19 kg/m2  SpO2 100%  . Gen. well-developed well-nourished male no apparent stress alert and oriented 3 Right upper extremity with palpable pulse and thrill over radial-cephalic AV fistula. Audible bruit with stethoscope up to antecubital fossa.  Today I ordered a ultrasound of the radial cephalic fistula in the right upper extremity. It is widely patent with no evidence of stenosis. There are 2 competing branches which are not large-0.28 cm about midway between the wrist and antecubital area.  Visualize these with the sono site ultrasound at the bedside.     Assessment:     End-stage renal disease with patent right radial-cephalic AV fistula. Patient does have 2 competing branches coming off at the same location in the mid forearm I discussed possible ligation of these branches with the patient and he does not want to proceed with that at this time I encouraged him to discuss this with Dr. Clair Gulling Deterding    Plan:     He will discuss the fistula with his nephrologist and if decision is made to ligate these branches I would be happy to proceed with that. Patient stated he would rather wait until going on dialysis to see how the flows are prior to having the branches ligated. I discussed with him the fact that the branches were ligated now they would have 6 more weeks for the fistula to mature. Fistula cannot be utilized until early  May. If patient decides to proceed with ligation of these 2 branches we will be happy to schedule him at anytime

## 2014-05-08 NOTE — Anesthesia Preprocedure Evaluation (Signed)
Anesthesia Evaluation  Patient identified by MRN, date of birth, ID band Patient awake    Reviewed: Allergy & Precautions, NPO status , Patient's Chart, lab work & pertinent test results  History of Anesthesia Complications Negative for: history of anesthetic complications  Airway Mallampati: II  TM Distance: >3 FB Neck ROM: Full    Dental  (+) Dental Advisory Given, Poor Dentition, Missing   Pulmonary neg pulmonary ROS,    Pulmonary exam normal       Cardiovascular hypertension, Normal cardiovascular exam    Neuro/Psych Anxiety negative neurological ROS     GI/Hepatic Neg liver ROS, GERD-  ,  Endo/Other  negative endocrine ROS  Renal/GU Dialysis and ESRFRenal disease     Musculoskeletal  (+) Arthritis -,   Abdominal   Peds  Hematology   Anesthesia Other Findings   Reproductive/Obstetrics                             Anesthesia Physical Anesthesia Plan  ASA: III  Anesthesia Plan: MAC   Post-op Pain Management:    Induction:   Airway Management Planned: Simple Face Mask  Additional Equipment:   Intra-op Plan:   Post-operative Plan:   Informed Consent: I have reviewed the patients History and Physical, chart, labs and discussed the procedure including the risks, benefits and alternatives for the proposed anesthesia with the patient or authorized representative who has indicated his/her understanding and acceptance.   Dental advisory given  Plan Discussed with: CRNA, Anesthesiologist and Surgeon  Anesthesia Plan Comments:         Anesthesia Quick Evaluation

## 2014-05-08 NOTE — Transfer of Care (Signed)
Immediate Anesthesia Transfer of Care Note  Patient:  Eric [name]  Procedure(s) Performed: Procedure(s): LIGATION OF COMPETING BRANCHES OF RIGHT ARM RADIOCEPHALIC ARTERIOVENOUS FISTULA (Right)  Patient Location: PACU  Anesthesia Type:MAC  Level of Consciousness: awake, alert , oriented and patient cooperative  Airway & Oxygen Therapy: Patient Spontanous Breathing  Post-op Assessment: Report given to RN, Post -op Vital signs reviewed and stable and Patient moving all extremities  Post vital signs: Reviewed and stable  Last Vitals:  Filed Vitals:   05/08/14 0917  BP: 123/48  Pulse: 58  Temp: 36.2 C  Resp: 16    Complications: No apparent anesthesia complications

## 2014-05-08 NOTE — Anesthesia Postprocedure Evaluation (Signed)
Anesthesia Post Note  Patient: Eric [name]  Procedure(s) Performed: Procedure(s) (LRB): LIGATION OF COMPETING BRANCHES OF RIGHT ARM RADIOCEPHALIC ARTERIOVENOUS FISTULA (Right)  Anesthesia type: MAC  Patient location: PACU  Post pain: Pain level controlled  Post assessment: Patient's Cardiovascular Status Stable  Last Vitals:  Filed Vitals:   05/08/14 1300  BP: 150/62  Pulse: 60  Temp:   Resp: 15    Post vital signs: Reviewed and stable  Level of consciousness: sedated  Complications: No apparent anesthesia complications

## 2014-05-08 NOTE — Interval H&P Note (Signed)
History and Physical Interval Note:  05/08/2014 11:09 AM  Eric [name]  has presented today for surgery, with the diagnosis of End Stage Renal Disease N18.6  The various methods of treatment have been discussed with the patient and family. After consideration of risks, benefits and other options for treatment, the patient has consented to  Procedure(s): LIGATION OF COMPETING BRANCHES OF RADIOCEPHALIC ARTERIOVENOUS FISTULA (Right) as a surgical intervention .  The patient's history has been reviewed, patient examined, no change in status, stable for surgery.  I have reviewed the patient's chart and labs.  Questions were answered to the patient's satisfaction.     [name]

## 2014-05-08 NOTE — Op Note (Signed)
OPERATIVE REPORT  Date of Surgery: 05/08/2014  Surgeon: [name], MD  Assistant: Nurse  Pre-op Diagnosis: End Stage Renal Disease N18.6  Post-op Diagnosis: End Stage Renal Disease N18.6  Procedure: Procedure(s): LIGATION OF COMPETING BRANCHES OF RIGHT ARM RADIOCEPHALIC ARTERIOVENOUS FISTULA  Anesthesia: Mac  EBL: Minimal  Complications: None  Procedure Details: The patient was taken the operating room placed in supine position at which time the right upper extremity was prepped Betadine scrub and solution draped in routine sterile manner. Using the B-mode ultrasound-sono site I visualized the 2 competing branches in the mid forearm marked these with a skin marker. After infiltration with 1% Xylocaine with epinephrine short longitudinal incision was made over the cephalic vein where the branches were located. Branches were easily identified after the vein was dissected free number ligated with 4-0 silk ties bilaterally. This did slightly improve the pulse and thrill in the fistula. The fistula was visualized with the sono site branches were successfully ligated and there was good flow. Adequate hemostasis was achieved and wound closed in layers with Vicryl in subcuticular fashion with Dermabond patient taken to recovery room in satisfactory condition   [name], MD 05/08/2014 12:37 PM

## 2014-05-09 DIAGNOSIS — N184 Chronic kidney disease, stage 4 (severe): Secondary | ICD-10-CM | POA: Diagnosis not present

## 2014-05-09 DIAGNOSIS — N186 End stage renal disease: Secondary | ICD-10-CM | POA: Diagnosis not present

## 2014-05-09 DIAGNOSIS — D631 Anemia in chronic kidney disease: Secondary | ICD-10-CM | POA: Diagnosis not present

## 2014-05-09 DIAGNOSIS — D509 Iron deficiency anemia, unspecified: Secondary | ICD-10-CM | POA: Diagnosis not present

## 2014-05-09 DIAGNOSIS — N2581 Secondary hyperparathyroidism of renal origin: Secondary | ICD-10-CM | POA: Diagnosis not present

## 2014-05-11 DIAGNOSIS — N2581 Secondary hyperparathyroidism of renal origin: Secondary | ICD-10-CM | POA: Diagnosis not present

## 2014-05-11 DIAGNOSIS — N184 Chronic kidney disease, stage 4 (severe): Secondary | ICD-10-CM | POA: Diagnosis not present

## 2014-05-11 DIAGNOSIS — D631 Anemia in chronic kidney disease: Secondary | ICD-10-CM | POA: Diagnosis not present

## 2014-05-11 DIAGNOSIS — D509 Iron deficiency anemia, unspecified: Secondary | ICD-10-CM | POA: Diagnosis not present

## 2014-05-11 DIAGNOSIS — N186 End stage renal disease: Secondary | ICD-10-CM | POA: Diagnosis not present

## 2014-05-13 ENCOUNTER — Encounter (HOSPITAL_COMMUNITY): Payer: Self-pay | Admitting: Vascular Surgery

## 2014-05-14 DIAGNOSIS — N186 End stage renal disease: Secondary | ICD-10-CM | POA: Diagnosis not present

## 2014-05-14 DIAGNOSIS — N184 Chronic kidney disease, stage 4 (severe): Secondary | ICD-10-CM | POA: Diagnosis not present

## 2014-05-14 DIAGNOSIS — N2581 Secondary hyperparathyroidism of renal origin: Secondary | ICD-10-CM | POA: Diagnosis not present

## 2014-05-14 DIAGNOSIS — D509 Iron deficiency anemia, unspecified: Secondary | ICD-10-CM | POA: Diagnosis not present

## 2014-05-14 DIAGNOSIS — D631 Anemia in chronic kidney disease: Secondary | ICD-10-CM | POA: Diagnosis not present

## 2014-05-16 DIAGNOSIS — N2581 Secondary hyperparathyroidism of renal origin: Secondary | ICD-10-CM | POA: Diagnosis not present

## 2014-05-16 DIAGNOSIS — D509 Iron deficiency anemia, unspecified: Secondary | ICD-10-CM | POA: Diagnosis not present

## 2014-05-16 DIAGNOSIS — N184 Chronic kidney disease, stage 4 (severe): Secondary | ICD-10-CM | POA: Diagnosis not present

## 2014-05-16 DIAGNOSIS — N186 End stage renal disease: Secondary | ICD-10-CM | POA: Diagnosis not present

## 2014-05-16 DIAGNOSIS — D631 Anemia in chronic kidney disease: Secondary | ICD-10-CM | POA: Diagnosis not present

## 2014-05-17 ENCOUNTER — Encounter: Payer: Self-pay | Admitting: Internal Medicine

## 2014-05-18 DIAGNOSIS — N186 End stage renal disease: Secondary | ICD-10-CM | POA: Diagnosis not present

## 2014-05-18 DIAGNOSIS — N2581 Secondary hyperparathyroidism of renal origin: Secondary | ICD-10-CM | POA: Diagnosis not present

## 2014-05-18 DIAGNOSIS — D509 Iron deficiency anemia, unspecified: Secondary | ICD-10-CM | POA: Diagnosis not present

## 2014-05-18 DIAGNOSIS — N184 Chronic kidney disease, stage 4 (severe): Secondary | ICD-10-CM | POA: Diagnosis not present

## 2014-05-18 DIAGNOSIS — D631 Anemia in chronic kidney disease: Secondary | ICD-10-CM | POA: Diagnosis not present

## 2014-05-21 DIAGNOSIS — N2581 Secondary hyperparathyroidism of renal origin: Secondary | ICD-10-CM | POA: Diagnosis not present

## 2014-05-21 DIAGNOSIS — D509 Iron deficiency anemia, unspecified: Secondary | ICD-10-CM | POA: Diagnosis not present

## 2014-05-21 DIAGNOSIS — N184 Chronic kidney disease, stage 4 (severe): Secondary | ICD-10-CM | POA: Diagnosis not present

## 2014-05-21 DIAGNOSIS — D631 Anemia in chronic kidney disease: Secondary | ICD-10-CM | POA: Diagnosis not present

## 2014-05-21 DIAGNOSIS — N186 End stage renal disease: Secondary | ICD-10-CM | POA: Diagnosis not present

## 2014-05-23 DIAGNOSIS — N184 Chronic kidney disease, stage 4 (severe): Secondary | ICD-10-CM | POA: Diagnosis not present

## 2014-05-23 DIAGNOSIS — N2581 Secondary hyperparathyroidism of renal origin: Secondary | ICD-10-CM | POA: Diagnosis not present

## 2014-05-23 DIAGNOSIS — N186 End stage renal disease: Secondary | ICD-10-CM | POA: Diagnosis not present

## 2014-05-23 DIAGNOSIS — D509 Iron deficiency anemia, unspecified: Secondary | ICD-10-CM | POA: Diagnosis not present

## 2014-05-23 DIAGNOSIS — D631 Anemia in chronic kidney disease: Secondary | ICD-10-CM | POA: Diagnosis not present

## 2014-05-25 DIAGNOSIS — D631 Anemia in chronic kidney disease: Secondary | ICD-10-CM | POA: Diagnosis not present

## 2014-05-25 DIAGNOSIS — N186 End stage renal disease: Secondary | ICD-10-CM | POA: Diagnosis not present

## 2014-05-25 DIAGNOSIS — N2581 Secondary hyperparathyroidism of renal origin: Secondary | ICD-10-CM | POA: Diagnosis not present

## 2014-05-25 DIAGNOSIS — D509 Iron deficiency anemia, unspecified: Secondary | ICD-10-CM | POA: Diagnosis not present

## 2014-05-25 DIAGNOSIS — N184 Chronic kidney disease, stage 4 (severe): Secondary | ICD-10-CM | POA: Diagnosis not present

## 2014-05-28 DIAGNOSIS — N2581 Secondary hyperparathyroidism of renal origin: Secondary | ICD-10-CM | POA: Diagnosis not present

## 2014-05-28 DIAGNOSIS — D509 Iron deficiency anemia, unspecified: Secondary | ICD-10-CM | POA: Diagnosis not present

## 2014-05-28 DIAGNOSIS — D631 Anemia in chronic kidney disease: Secondary | ICD-10-CM | POA: Diagnosis not present

## 2014-05-28 DIAGNOSIS — N184 Chronic kidney disease, stage 4 (severe): Secondary | ICD-10-CM | POA: Diagnosis not present

## 2014-05-28 DIAGNOSIS — N186 End stage renal disease: Secondary | ICD-10-CM | POA: Diagnosis not present

## 2014-05-30 DIAGNOSIS — N2581 Secondary hyperparathyroidism of renal origin: Secondary | ICD-10-CM | POA: Diagnosis not present

## 2014-05-30 DIAGNOSIS — D631 Anemia in chronic kidney disease: Secondary | ICD-10-CM | POA: Diagnosis not present

## 2014-05-30 DIAGNOSIS — D509 Iron deficiency anemia, unspecified: Secondary | ICD-10-CM | POA: Diagnosis not present

## 2014-05-30 DIAGNOSIS — N186 End stage renal disease: Secondary | ICD-10-CM | POA: Diagnosis not present

## 2014-05-30 DIAGNOSIS — N184 Chronic kidney disease, stage 4 (severe): Secondary | ICD-10-CM | POA: Diagnosis not present

## 2014-06-01 DIAGNOSIS — N2581 Secondary hyperparathyroidism of renal origin: Secondary | ICD-10-CM | POA: Diagnosis not present

## 2014-06-01 DIAGNOSIS — D509 Iron deficiency anemia, unspecified: Secondary | ICD-10-CM | POA: Diagnosis not present

## 2014-06-01 DIAGNOSIS — N184 Chronic kidney disease, stage 4 (severe): Secondary | ICD-10-CM | POA: Diagnosis not present

## 2014-06-01 DIAGNOSIS — N186 End stage renal disease: Secondary | ICD-10-CM | POA: Diagnosis not present

## 2014-06-01 DIAGNOSIS — D631 Anemia in chronic kidney disease: Secondary | ICD-10-CM | POA: Diagnosis not present

## 2014-06-04 DIAGNOSIS — N186 End stage renal disease: Secondary | ICD-10-CM | POA: Diagnosis not present

## 2014-06-04 DIAGNOSIS — N2889 Other specified disorders of kidney and ureter: Secondary | ICD-10-CM | POA: Diagnosis not present

## 2014-06-04 DIAGNOSIS — D509 Iron deficiency anemia, unspecified: Secondary | ICD-10-CM | POA: Diagnosis not present

## 2014-06-04 DIAGNOSIS — Z992 Dependence on renal dialysis: Secondary | ICD-10-CM | POA: Diagnosis not present

## 2014-06-04 DIAGNOSIS — N184 Chronic kidney disease, stage 4 (severe): Secondary | ICD-10-CM | POA: Diagnosis not present

## 2014-06-04 DIAGNOSIS — D631 Anemia in chronic kidney disease: Secondary | ICD-10-CM | POA: Diagnosis not present

## 2014-06-04 DIAGNOSIS — N2581 Secondary hyperparathyroidism of renal origin: Secondary | ICD-10-CM | POA: Diagnosis not present

## 2014-06-06 DIAGNOSIS — N186 End stage renal disease: Secondary | ICD-10-CM | POA: Diagnosis not present

## 2014-06-06 DIAGNOSIS — N2581 Secondary hyperparathyroidism of renal origin: Secondary | ICD-10-CM | POA: Diagnosis not present

## 2014-06-06 DIAGNOSIS — N184 Chronic kidney disease, stage 4 (severe): Secondary | ICD-10-CM | POA: Diagnosis not present

## 2014-06-06 DIAGNOSIS — D631 Anemia in chronic kidney disease: Secondary | ICD-10-CM | POA: Diagnosis not present

## 2014-06-06 DIAGNOSIS — D509 Iron deficiency anemia, unspecified: Secondary | ICD-10-CM | POA: Diagnosis not present

## 2014-06-08 DIAGNOSIS — N184 Chronic kidney disease, stage 4 (severe): Secondary | ICD-10-CM | POA: Diagnosis not present

## 2014-06-08 DIAGNOSIS — D509 Iron deficiency anemia, unspecified: Secondary | ICD-10-CM | POA: Diagnosis not present

## 2014-06-08 DIAGNOSIS — D631 Anemia in chronic kidney disease: Secondary | ICD-10-CM | POA: Diagnosis not present

## 2014-06-08 DIAGNOSIS — N2581 Secondary hyperparathyroidism of renal origin: Secondary | ICD-10-CM | POA: Diagnosis not present

## 2014-06-08 DIAGNOSIS — N186 End stage renal disease: Secondary | ICD-10-CM | POA: Diagnosis not present

## 2014-06-11 DIAGNOSIS — N184 Chronic kidney disease, stage 4 (severe): Secondary | ICD-10-CM | POA: Diagnosis not present

## 2014-06-11 DIAGNOSIS — D509 Iron deficiency anemia, unspecified: Secondary | ICD-10-CM | POA: Diagnosis not present

## 2014-06-11 DIAGNOSIS — D631 Anemia in chronic kidney disease: Secondary | ICD-10-CM | POA: Diagnosis not present

## 2014-06-11 DIAGNOSIS — N186 End stage renal disease: Secondary | ICD-10-CM | POA: Diagnosis not present

## 2014-06-11 DIAGNOSIS — N2581 Secondary hyperparathyroidism of renal origin: Secondary | ICD-10-CM | POA: Diagnosis not present

## 2014-06-13 DIAGNOSIS — N186 End stage renal disease: Secondary | ICD-10-CM | POA: Diagnosis not present

## 2014-06-13 DIAGNOSIS — N184 Chronic kidney disease, stage 4 (severe): Secondary | ICD-10-CM | POA: Diagnosis not present

## 2014-06-13 DIAGNOSIS — N2581 Secondary hyperparathyroidism of renal origin: Secondary | ICD-10-CM | POA: Diagnosis not present

## 2014-06-13 DIAGNOSIS — D509 Iron deficiency anemia, unspecified: Secondary | ICD-10-CM | POA: Diagnosis not present

## 2014-06-13 DIAGNOSIS — D631 Anemia in chronic kidney disease: Secondary | ICD-10-CM | POA: Diagnosis not present

## 2014-06-15 DIAGNOSIS — N184 Chronic kidney disease, stage 4 (severe): Secondary | ICD-10-CM | POA: Diagnosis not present

## 2014-06-15 DIAGNOSIS — D631 Anemia in chronic kidney disease: Secondary | ICD-10-CM | POA: Diagnosis not present

## 2014-06-15 DIAGNOSIS — D509 Iron deficiency anemia, unspecified: Secondary | ICD-10-CM | POA: Diagnosis not present

## 2014-06-15 DIAGNOSIS — N186 End stage renal disease: Secondary | ICD-10-CM | POA: Diagnosis not present

## 2014-06-15 DIAGNOSIS — N2581 Secondary hyperparathyroidism of renal origin: Secondary | ICD-10-CM | POA: Diagnosis not present

## 2014-06-18 DIAGNOSIS — N184 Chronic kidney disease, stage 4 (severe): Secondary | ICD-10-CM | POA: Diagnosis not present

## 2014-06-18 DIAGNOSIS — D631 Anemia in chronic kidney disease: Secondary | ICD-10-CM | POA: Diagnosis not present

## 2014-06-18 DIAGNOSIS — D509 Iron deficiency anemia, unspecified: Secondary | ICD-10-CM | POA: Diagnosis not present

## 2014-06-18 DIAGNOSIS — N2581 Secondary hyperparathyroidism of renal origin: Secondary | ICD-10-CM | POA: Diagnosis not present

## 2014-06-18 DIAGNOSIS — N186 End stage renal disease: Secondary | ICD-10-CM | POA: Diagnosis not present

## 2014-06-20 DIAGNOSIS — D509 Iron deficiency anemia, unspecified: Secondary | ICD-10-CM | POA: Diagnosis not present

## 2014-06-20 DIAGNOSIS — N186 End stage renal disease: Secondary | ICD-10-CM | POA: Diagnosis not present

## 2014-06-20 DIAGNOSIS — N2581 Secondary hyperparathyroidism of renal origin: Secondary | ICD-10-CM | POA: Diagnosis not present

## 2014-06-20 DIAGNOSIS — N184 Chronic kidney disease, stage 4 (severe): Secondary | ICD-10-CM | POA: Diagnosis not present

## 2014-06-20 DIAGNOSIS — D631 Anemia in chronic kidney disease: Secondary | ICD-10-CM | POA: Diagnosis not present

## 2014-06-22 DIAGNOSIS — D509 Iron deficiency anemia, unspecified: Secondary | ICD-10-CM | POA: Diagnosis not present

## 2014-06-22 DIAGNOSIS — N184 Chronic kidney disease, stage 4 (severe): Secondary | ICD-10-CM | POA: Diagnosis not present

## 2014-06-22 DIAGNOSIS — N2581 Secondary hyperparathyroidism of renal origin: Secondary | ICD-10-CM | POA: Diagnosis not present

## 2014-06-22 DIAGNOSIS — D631 Anemia in chronic kidney disease: Secondary | ICD-10-CM | POA: Diagnosis not present

## 2014-06-22 DIAGNOSIS — N186 End stage renal disease: Secondary | ICD-10-CM | POA: Diagnosis not present

## 2014-06-25 DIAGNOSIS — D509 Iron deficiency anemia, unspecified: Secondary | ICD-10-CM | POA: Diagnosis not present

## 2014-06-25 DIAGNOSIS — N2581 Secondary hyperparathyroidism of renal origin: Secondary | ICD-10-CM | POA: Diagnosis not present

## 2014-06-25 DIAGNOSIS — N184 Chronic kidney disease, stage 4 (severe): Secondary | ICD-10-CM | POA: Diagnosis not present

## 2014-06-25 DIAGNOSIS — D631 Anemia in chronic kidney disease: Secondary | ICD-10-CM | POA: Diagnosis not present

## 2014-06-25 DIAGNOSIS — N186 End stage renal disease: Secondary | ICD-10-CM | POA: Diagnosis not present

## 2014-06-27 DIAGNOSIS — N184 Chronic kidney disease, stage 4 (severe): Secondary | ICD-10-CM | POA: Diagnosis not present

## 2014-06-27 DIAGNOSIS — N2581 Secondary hyperparathyroidism of renal origin: Secondary | ICD-10-CM | POA: Diagnosis not present

## 2014-06-27 DIAGNOSIS — D509 Iron deficiency anemia, unspecified: Secondary | ICD-10-CM | POA: Diagnosis not present

## 2014-06-27 DIAGNOSIS — N186 End stage renal disease: Secondary | ICD-10-CM | POA: Diagnosis not present

## 2014-06-27 DIAGNOSIS — D631 Anemia in chronic kidney disease: Secondary | ICD-10-CM | POA: Diagnosis not present

## 2014-06-29 DIAGNOSIS — N184 Chronic kidney disease, stage 4 (severe): Secondary | ICD-10-CM | POA: Diagnosis not present

## 2014-06-29 DIAGNOSIS — N186 End stage renal disease: Secondary | ICD-10-CM | POA: Diagnosis not present

## 2014-06-29 DIAGNOSIS — D631 Anemia in chronic kidney disease: Secondary | ICD-10-CM | POA: Diagnosis not present

## 2014-06-29 DIAGNOSIS — N2581 Secondary hyperparathyroidism of renal origin: Secondary | ICD-10-CM | POA: Diagnosis not present

## 2014-06-29 DIAGNOSIS — D509 Iron deficiency anemia, unspecified: Secondary | ICD-10-CM | POA: Diagnosis not present

## 2014-07-02 DIAGNOSIS — N184 Chronic kidney disease, stage 4 (severe): Secondary | ICD-10-CM | POA: Diagnosis not present

## 2014-07-02 DIAGNOSIS — D631 Anemia in chronic kidney disease: Secondary | ICD-10-CM | POA: Diagnosis not present

## 2014-07-02 DIAGNOSIS — N2581 Secondary hyperparathyroidism of renal origin: Secondary | ICD-10-CM | POA: Diagnosis not present

## 2014-07-02 DIAGNOSIS — N186 End stage renal disease: Secondary | ICD-10-CM | POA: Diagnosis not present

## 2014-07-02 DIAGNOSIS — D509 Iron deficiency anemia, unspecified: Secondary | ICD-10-CM | POA: Diagnosis not present

## 2014-07-04 DIAGNOSIS — N2581 Secondary hyperparathyroidism of renal origin: Secondary | ICD-10-CM | POA: Diagnosis not present

## 2014-07-04 DIAGNOSIS — D631 Anemia in chronic kidney disease: Secondary | ICD-10-CM | POA: Diagnosis not present

## 2014-07-04 DIAGNOSIS — N184 Chronic kidney disease, stage 4 (severe): Secondary | ICD-10-CM | POA: Diagnosis not present

## 2014-07-04 DIAGNOSIS — Z992 Dependence on renal dialysis: Secondary | ICD-10-CM | POA: Diagnosis not present

## 2014-07-04 DIAGNOSIS — D509 Iron deficiency anemia, unspecified: Secondary | ICD-10-CM | POA: Diagnosis not present

## 2014-07-04 DIAGNOSIS — N2889 Other specified disorders of kidney and ureter: Secondary | ICD-10-CM | POA: Diagnosis not present

## 2014-07-04 DIAGNOSIS — N186 End stage renal disease: Secondary | ICD-10-CM | POA: Diagnosis not present

## 2014-07-06 DIAGNOSIS — N184 Chronic kidney disease, stage 4 (severe): Secondary | ICD-10-CM | POA: Diagnosis not present

## 2014-07-06 DIAGNOSIS — D631 Anemia in chronic kidney disease: Secondary | ICD-10-CM | POA: Diagnosis not present

## 2014-07-06 DIAGNOSIS — N186 End stage renal disease: Secondary | ICD-10-CM | POA: Diagnosis not present

## 2014-07-06 DIAGNOSIS — D509 Iron deficiency anemia, unspecified: Secondary | ICD-10-CM | POA: Diagnosis not present

## 2014-07-06 DIAGNOSIS — N2581 Secondary hyperparathyroidism of renal origin: Secondary | ICD-10-CM | POA: Diagnosis not present

## 2014-07-09 DIAGNOSIS — N2581 Secondary hyperparathyroidism of renal origin: Secondary | ICD-10-CM | POA: Diagnosis not present

## 2014-07-09 DIAGNOSIS — N184 Chronic kidney disease, stage 4 (severe): Secondary | ICD-10-CM | POA: Diagnosis not present

## 2014-07-09 DIAGNOSIS — N186 End stage renal disease: Secondary | ICD-10-CM | POA: Diagnosis not present

## 2014-07-09 DIAGNOSIS — D631 Anemia in chronic kidney disease: Secondary | ICD-10-CM | POA: Diagnosis not present

## 2014-07-09 DIAGNOSIS — D509 Iron deficiency anemia, unspecified: Secondary | ICD-10-CM | POA: Diagnosis not present

## 2014-07-10 ENCOUNTER — Other Ambulatory Visit: Payer: Self-pay

## 2014-07-10 DIAGNOSIS — T82510A Breakdown (mechanical) of surgically created arteriovenous fistula, initial encounter: Secondary | ICD-10-CM

## 2014-07-11 ENCOUNTER — Ambulatory Visit (INDEPENDENT_AMBULATORY_CARE_PROVIDER_SITE_OTHER): Payer: Medicare Other | Admitting: Vascular Surgery

## 2014-07-11 ENCOUNTER — Other Ambulatory Visit: Payer: Self-pay | Admitting: Vascular Surgery

## 2014-07-11 ENCOUNTER — Encounter: Payer: Self-pay | Admitting: Vascular Surgery

## 2014-07-11 ENCOUNTER — Ambulatory Visit (HOSPITAL_COMMUNITY)
Admission: RE | Admit: 2014-07-11 | Discharge: 2014-07-11 | Disposition: A | Discharge status: Home / Self Care | Payer: Medicare Other | Source: Ambulatory Visit | Attending: Vascular Surgery | Admitting: Vascular Surgery

## 2014-07-11 ENCOUNTER — Other Ambulatory Visit: Payer: Self-pay

## 2014-07-11 VITALS — BP 104/72 | HR 73 | Ht 69.5 in | Wt 172.0 lb

## 2014-07-11 DIAGNOSIS — D509 Iron deficiency anemia, unspecified: Secondary | ICD-10-CM | POA: Diagnosis not present

## 2014-07-11 DIAGNOSIS — Z4931 Encounter for adequacy testing for hemodialysis: Secondary | ICD-10-CM | POA: Diagnosis not present

## 2014-07-11 DIAGNOSIS — Y832 Surgical operation with anastomosis, bypass or graft as the cause of abnormal reaction of the patient, or of later complication, without mention of misadventure at the time of the procedure: Secondary | ICD-10-CM | POA: Diagnosis not present

## 2014-07-11 DIAGNOSIS — N2581 Secondary hyperparathyroidism of renal origin: Secondary | ICD-10-CM | POA: Diagnosis not present

## 2014-07-11 DIAGNOSIS — Z992 Dependence on renal dialysis: Secondary | ICD-10-CM | POA: Diagnosis not present

## 2014-07-11 DIAGNOSIS — D631 Anemia in chronic kidney disease: Secondary | ICD-10-CM | POA: Diagnosis not present

## 2014-07-11 DIAGNOSIS — T82510A Breakdown (mechanical) of surgically created arteriovenous fistula, initial encounter: Secondary | ICD-10-CM

## 2014-07-11 DIAGNOSIS — N186 End stage renal disease: Secondary | ICD-10-CM | POA: Diagnosis not present

## 2014-07-11 DIAGNOSIS — N184 Chronic kidney disease, stage 4 (severe): Secondary | ICD-10-CM | POA: Diagnosis not present

## 2014-07-11 NOTE — Progress Notes (Signed)
[facility] [place] HISTORY AND PHYSICAL   History of Present Illness:  Patient is a 81 y.o. year old male who presents for placement of a permanent hemodialysis access. .  The patient is currently on hemodialysis via a right-sided catheter.  He has previously had a left radiocephalic AV fistula which failed. He currently has a right radiocephalic AV fistula which has been revised recently with side branch ligation but they are unable to cannulate.  Other chronic medical problems include hypertension, arthritis, and reflux all of which are currently stable..  Past Medical History  Diagnosis Date  . CKD (chronic kidney disease) stage 5, GFR less than 15 ml/min   . Unspecified essential hypertension   . GERD (gastroesophageal reflux disease)   . Gout   . Herpes zoster 04/2012  . Complication of anesthesia     " one time my heart stopped due to a medication that I was on."   . Heart murmur     as a teen  . Shortness of breath dyspnea   . Anxiety   . Arthritis     Past Surgical History  Procedure Laterality Date  . Appendectomy  1944  . Cataract extraction w/ intraocular lens implant Bilateral 2014    [place]  . Dialysis fistula creation  08/04/2012 and 10/13    Dr. Harden Mo  . Colonoscopy  2013    Dr. Annamaria Helling [place], [place].  Marland Kitchen Av fistula placement Left     Left Cimino AVF placed in [place]   . Insertion of dialysis catheter Right 01-15-14    Right chest TDC placed by Dr. Augustin Coupe at [facility]  . Eye surgery      Cataract  . Av fistula placement Right 03/06/2014    Procedure: ARTERIOVENOUS (AV) FISTULA CREATION-right radiocephalic;  Surgeon: Mal Misty, MD;  Location: [facility] OR;  Service: Vascular;  Laterality: Right;  . Ligation of competing branches of arteriovenous fistula Right 05/08/2014    Procedure: LIGATION OF COMPETING BRANCHES OF RIGHT ARM RADIOCEPHALIC ARTERIOVENOUS FISTULA;  Surgeon: Mal Misty, MD;  Location: [facility];  Service:  Vascular;  Laterality: Right;     Social History History  Substance Use Topics  . Smoking status: Never Smoker   . Smokeless tobacco: Never Used  . Alcohol Use: 1.2 oz/week    1 Glasses of wine, 1 Shots of liquor per week     Comment: one glass a day of either wine or liquor    Family History Family History  Problem Relation Age of Onset  . Cancer Father     lung    Allergies  No Known Allergies   Current Outpatient Prescriptions  Medication Sig Dispense Refill  . allopurinol (ZYLOPRIM) 100 MG tablet Take 1 tablet (100 mg total) by mouth daily. 90 tablet 3  . amLODipine (NORVASC) 10 MG tablet Take one tablet by mouth once daily to control blood pressure (Patient taking differently: Take 10 mg by mouth daily. ) 30 tablet 3  . aspirin EC 81 MG tablet Take 81 mg by mouth daily.    Marland Kitchen atorvastatin (LIPITOR) 20 MG tablet Take 1 tablet (20 mg total) by mouth daily. (Patient taking differently: Take 20 mg by mouth at bedtime. ) 30 tablet 3  . Melatonin 10 MG TABS Take 10 mg by mouth at bedtime.     . multivitamin (RENA-VIT) TABS tablet Take 1 tablet by mouth daily.     Marland Kitchen omeprazole (PRILOSEC) 20 MG  capsule Take 20 mg by mouth 4 (four) times a week. Sunday, Tuesday, Thursday and Saturday    . Vitamin D, Ergocalciferol, (DRISDOL) 50000 UNITS CAPS capsule Take 50,000 Units by mouth every 14 (fourteen) days. 1st and 15th of each month    . oxyCODONE-acetaminophen (ROXICET) 5-325 MG per tablet Take 1 tablet by mouth every 6 (six) hours as needed for severe pain. (Patient not taking: Reported on 07/11/2014) 30 tablet 0   No current facility-administered medications for this visit.    ROS:   General:  No weight loss, Fever, chills  HEENT: No recent headaches, no nasal bleeding, no visual changes, no sore throat  Neurologic: No dizziness, blackouts, seizures. No recent symptoms of stroke or mini- stroke. No recent episodes of slurred speech, or temporary blindness.  Cardiac: No recent  episodes of chest pain/pressure, no shortness of breath at rest.  + shortness of breath with exertion.  Denies history of atrial fibrillation or irregular heartbeat  Vascular: No history of rest pain in feet.  No history of claudication.  No history of non-healing ulcer, No history of DVT   Pulmonary: No home oxygen, no productive cough, no hemoptysis,  No asthma or wheezing  Musculoskeletal:  [ ]  Arthritis, [ ]  Low back pain,  [ ]  Joint pain  Hematologic:No history of hypercoagulable state.  No history of easy bleeding.  No history of anemia  Gastrointestinal: No hematochezia or melena,  + gastroesophageal reflux, no trouble swallowing  Urinary: [ ]  chronic Kidney disease, [x ] on HD - [ ]  MWF or [x ] TTHS, [ ]  Burning with urination, [ ]  Frequent urination, [ ]  Difficulty urinating;   Skin: No rashes  Psychological: No history of anxiety,  No history of depression   Physical Examination  Filed Vitals:   07/11/14 1546  BP: 104/72  Pulse: 73  Height: 5' 9.5" (1.765 m)  Weight: 172 lb (78.019 kg)  SpO2: 98%    Body mass index is 25.04 kg/(m^2).  General:  Alert and oriented, no acute distress HEENT: Normal Neck: No bruit or JVD Pulmonary: Clear to auscultation bilaterally Cardiac: Regular Rate and Rhythm without murmur Gastrointestinal: Soft, non-tender, non-distended, no mass, no scars Skin: No rash Extremity Pulses:  2+ radial, brachial pulses bilaterally, audible bruit and right arm AV fistula but fistula overall is small Musculoskeletal: No deformity or edema  Neurologic: Upper and lower extremity motor 5/5 and symmetric  DATA: Fistula diameter 2-4 mm on ultrasound today. I reviewed and interpreted this study.   ASSESSMENT: Patient with non-maturing AV fistula right radiocephalic it was placed 4 months ago has failed to mature despite side branch ligation.   PLAN:  Right upper arm AV fistula scheduled for 07/15/2014. Risks benefits possible palpitations and  procedure details were discussed the patient today including not limited to bleeding infection non-maturation of AV fistula. He understands and agrees to proceed.  Ruta Hinds, MD [facility] [place] Office: [phone] Pager: [phone]

## 2014-07-12 ENCOUNTER — Other Ambulatory Visit (HOSPITAL_COMMUNITY): Payer: Self-pay | Admitting: *Deleted

## 2014-07-12 ENCOUNTER — Encounter (HOSPITAL_COMMUNITY): Payer: Self-pay | Admitting: *Deleted

## 2014-07-13 DIAGNOSIS — N184 Chronic kidney disease, stage 4 (severe): Secondary | ICD-10-CM | POA: Diagnosis not present

## 2014-07-13 DIAGNOSIS — D631 Anemia in chronic kidney disease: Secondary | ICD-10-CM | POA: Diagnosis not present

## 2014-07-13 DIAGNOSIS — N2581 Secondary hyperparathyroidism of renal origin: Secondary | ICD-10-CM | POA: Diagnosis not present

## 2014-07-13 DIAGNOSIS — N186 End stage renal disease: Secondary | ICD-10-CM | POA: Diagnosis not present

## 2014-07-13 DIAGNOSIS — D509 Iron deficiency anemia, unspecified: Secondary | ICD-10-CM | POA: Diagnosis not present

## 2014-07-15 ENCOUNTER — Observation Stay (HOSPITAL_COMMUNITY)
Admission: RE | Admit: 2014-07-15 | Discharge: 2014-07-16 | Disposition: A | Discharge status: Home / Self Care | Payer: Medicare Other | Source: Ambulatory Visit | Attending: Vascular Surgery | Admitting: Vascular Surgery

## 2014-07-15 ENCOUNTER — Ambulatory Visit (HOSPITAL_COMMUNITY): Payer: Medicare Other | Admitting: Certified Registered Nurse Anesthetist

## 2014-07-15 ENCOUNTER — Other Ambulatory Visit: Payer: Self-pay

## 2014-07-15 ENCOUNTER — Encounter (HOSPITAL_COMMUNITY): Payer: Self-pay

## 2014-07-15 ENCOUNTER — Encounter (HOSPITAL_COMMUNITY)
Admission: RE | Disposition: A | Discharge status: Home / Self Care | Payer: Self-pay | Source: Ambulatory Visit | Attending: Vascular Surgery

## 2014-07-15 DIAGNOSIS — N185 Chronic kidney disease, stage 5: Secondary | ICD-10-CM | POA: Diagnosis not present

## 2014-07-15 DIAGNOSIS — N186 End stage renal disease: Secondary | ICD-10-CM

## 2014-07-15 DIAGNOSIS — M109 Gout, unspecified: Secondary | ICD-10-CM | POA: Insufficient documentation

## 2014-07-15 DIAGNOSIS — I120 Hypertensive chronic kidney disease with stage 5 chronic kidney disease or end stage renal disease: Secondary | ICD-10-CM | POA: Diagnosis not present

## 2014-07-15 DIAGNOSIS — F419 Anxiety disorder, unspecified: Secondary | ICD-10-CM | POA: Diagnosis not present

## 2014-07-15 DIAGNOSIS — R0602 Shortness of breath: Secondary | ICD-10-CM | POA: Insufficient documentation

## 2014-07-15 DIAGNOSIS — Z7982 Long term (current) use of aspirin: Secondary | ICD-10-CM | POA: Diagnosis not present

## 2014-07-15 DIAGNOSIS — Z992 Dependence on renal dialysis: Secondary | ICD-10-CM | POA: Insufficient documentation

## 2014-07-15 DIAGNOSIS — T82838A Hemorrhage of vascular prosthetic devices, implants and grafts, initial encounter: Secondary | ICD-10-CM | POA: Diagnosis not present

## 2014-07-15 DIAGNOSIS — T82590A Other mechanical complication of surgically created arteriovenous fistula, initial encounter: Principal | ICD-10-CM | POA: Insufficient documentation

## 2014-07-15 DIAGNOSIS — K219 Gastro-esophageal reflux disease without esophagitis: Secondary | ICD-10-CM | POA: Insufficient documentation

## 2014-07-15 DIAGNOSIS — M1990 Unspecified osteoarthritis, unspecified site: Secondary | ICD-10-CM | POA: Insufficient documentation

## 2014-07-15 DIAGNOSIS — Z79899 Other long term (current) drug therapy: Secondary | ICD-10-CM | POA: Insufficient documentation

## 2014-07-15 DIAGNOSIS — L7622 Postprocedural hemorrhage and hematoma of skin and subcutaneous tissue following other procedure: Secondary | ICD-10-CM | POA: Insufficient documentation

## 2014-07-15 DIAGNOSIS — Z48812 Encounter for surgical aftercare following surgery on the circulatory system: Secondary | ICD-10-CM

## 2014-07-15 HISTORY — PX: REVISON OF ARTERIOVENOUS FISTULA: SHX6074

## 2014-07-15 HISTORY — PX: AV FISTULA PLACEMENT: SHX1204

## 2014-07-15 HISTORY — PX: LIGATION OF ARTERIOVENOUS  FISTULA: SHX5948

## 2014-07-15 LAB — POCT I-STAT 4, (NA,K, GLUC, HGB,HCT)
Glucose, Bld: 110 mg/dL — ABNORMAL HIGH (ref 65–99)
HEMATOCRIT: 38 % — AB (ref 39.0–52.0)
Hemoglobin: 12.9 g/dL — ABNORMAL LOW (ref 13.0–17.0)
Potassium: 5.1 mmol/L (ref 3.5–5.1)
SODIUM: 140 mmol/L (ref 135–145)

## 2014-07-15 SURGERY — REVISON OF ARTERIOVENOUS FISTULA
Anesthesia: Monitor Anesthesia Care | Site: Arm Upper | Laterality: Right

## 2014-07-15 SURGERY — ARTERIOVENOUS (AV) FISTULA CREATION
Anesthesia: Monitor Anesthesia Care | Site: Arm Upper | Laterality: Right

## 2014-07-15 MED ORDER — HYDRALAZINE HCL 20 MG/ML IJ SOLN: 5.0000 mg | INTRAMUSCULAR | Status: DC | PRN

## 2014-07-15 MED ORDER — SODIUM CHLORIDE 0.9 % IV SOLN: 250.0000 mL | INTRAVENOUS | Status: DC | PRN

## 2014-07-15 MED ORDER — PANTOPRAZOLE SODIUM 40 MG PO TBEC
40.0000 mg | DELAYED_RELEASE_TABLET | Freq: Every day | ORAL | Status: DC
Start: 2014-07-15 — End: 2014-07-16
  Administered 2014-07-15: 40 mg via ORAL
  Filled 2014-07-15: qty 1

## 2014-07-15 MED ORDER — ACETAMINOPHEN 325 MG PO TABS: 325.0000 mg | ORAL_TABLET | ORAL | Status: DC | PRN

## 2014-07-15 MED ORDER — LABETALOL HCL 5 MG/ML IV SOLN
10.0000 mg | INTRAVENOUS | Status: DC | PRN
  Filled 2014-07-15: qty 4

## 2014-07-15 MED ORDER — FENTANYL CITRATE (PF) 100 MCG/2ML IJ SOLN
INTRAMUSCULAR | Status: DC | PRN
  Administered 2014-07-15 (×2): 25 ug via INTRAVENOUS

## 2014-07-15 MED ORDER — MIDAZOLAM HCL 5 MG/5ML IJ SOLN
INTRAMUSCULAR | Status: DC | PRN
  Administered 2014-07-15: 2 mg via INTRAVENOUS

## 2014-07-15 MED ORDER — RENA-VITE PO TABS
1.0000 | ORAL_TABLET | Freq: Every day | ORAL | Status: DC
  Administered 2014-07-15: 1 via ORAL
  Filled 2014-07-15 (×2): qty 1

## 2014-07-15 MED ORDER — EPHEDRINE SULFATE 50 MG/ML IJ SOLN
INTRAMUSCULAR | Status: DC | PRN
  Administered 2014-07-15 (×2): 5 mg via INTRAVENOUS

## 2014-07-15 MED ORDER — LIDOCAINE HCL (PF) 1 % IJ SOLN
INTRAMUSCULAR | Status: DC | PRN
Start: 2014-07-15 — End: 2014-07-15
  Administered 2014-07-15: 5 mL via INTRADERMAL

## 2014-07-15 MED ORDER — SODIUM CHLORIDE 0.9 % IJ SOLN: 3.0000 mL | INTRAMUSCULAR | Status: DC | PRN

## 2014-07-15 MED ORDER — HEPARIN SODIUM (PORCINE) 1000 UNIT/ML IJ SOLN
INTRAMUSCULAR | Status: AC
  Filled 2014-07-15: qty 1

## 2014-07-15 MED ORDER — ONDANSETRON HCL 4 MG/2ML IJ SOLN: 4.0000 mg | Freq: Four times a day (QID) | INTRAMUSCULAR | Status: DC | PRN

## 2014-07-15 MED ORDER — CEFUROXIME SODIUM 1.5 G IJ SOLR
INTRAMUSCULAR | Status: AC
  Administered 2014-07-15: 1.5 g via INTRAVENOUS
  Filled 2014-07-15: qty 1.5

## 2014-07-15 MED ORDER — ASPIRIN EC 81 MG PO TBEC
81.0000 mg | DELAYED_RELEASE_TABLET | Freq: Every day | ORAL | Status: DC
  Administered 2014-07-15: 81 mg via ORAL
  Filled 2014-07-15 (×2): qty 1

## 2014-07-15 MED ORDER — GUAIFENESIN-DM 100-10 MG/5ML PO SYRP: 15.0000 mL | ORAL_SOLUTION | ORAL | Status: DC | PRN

## 2014-07-15 MED ORDER — SODIUM CHLORIDE 0.9 % IV SOLN
INTRAVENOUS | Status: DC | PRN
  Administered 2014-07-15: 13:00:00 via INTRAVENOUS

## 2014-07-15 MED ORDER — MIDAZOLAM HCL 2 MG/2ML IJ SOLN
INTRAMUSCULAR | Status: AC
  Filled 2014-07-15: qty 2

## 2014-07-15 MED ORDER — 0.9 % SODIUM CHLORIDE (POUR BTL) OPTIME
TOPICAL | Status: DC | PRN
  Administered 2014-07-15: 1000 mL

## 2014-07-15 MED ORDER — SODIUM CHLORIDE 0.9 % IR SOLN
Status: DC | PRN
  Administered 2014-07-15: 13:00:00

## 2014-07-15 MED ORDER — MEPERIDINE HCL 25 MG/ML IJ SOLN: 6.2500 mg | INTRAMUSCULAR | Status: DC | PRN

## 2014-07-15 MED ORDER — PROMETHAZINE HCL 25 MG/ML IJ SOLN: 6.2500 mg | INTRAMUSCULAR | Status: DC | PRN

## 2014-07-15 MED ORDER — PHENOL 1.4 % MT LIQD
1.0000 | OROMUCOSAL | Status: DC | PRN
  Filled 2014-07-15: qty 177

## 2014-07-15 MED ORDER — THROMBIN 20000 UNITS EX SOLR
CUTANEOUS | Status: AC
  Filled 2014-07-15: qty 20000

## 2014-07-15 MED ORDER — ATORVASTATIN CALCIUM 20 MG PO TABS
20.0000 mg | ORAL_TABLET | Freq: Every day | ORAL | Status: DC
  Administered 2014-07-15: 20 mg via ORAL
  Filled 2014-07-15 (×2): qty 1

## 2014-07-15 MED ORDER — SODIUM CHLORIDE 0.9 % IJ SOLN
3.0000 mL | Freq: Two times a day (BID) | INTRAMUSCULAR | Status: DC
  Administered 2014-07-15 (×2): 3 mL via INTRAVENOUS

## 2014-07-15 MED ORDER — LIDOCAINE HCL (PF) 1 % IJ SOLN
INTRAMUSCULAR | Status: AC
  Filled 2014-07-15: qty 30

## 2014-07-15 MED ORDER — VITAMIN D (ERGOCALCIFEROL) 1.25 MG (50000 UNIT) PO CAPS: 50000.0000 [IU] | ORAL_CAPSULE | ORAL | Status: DC

## 2014-07-15 MED ORDER — PROPOFOL INFUSION 10 MG/ML OPTIME
INTRAVENOUS | Status: DC | PRN
  Administered 2014-07-15: 75 ug/kg/min via INTRAVENOUS

## 2014-07-15 MED ORDER — SODIUM CHLORIDE 0.9 % IR SOLN
Status: DC | PRN
  Administered 2014-07-15: 10:00:00

## 2014-07-15 MED ORDER — METOPROLOL TARTRATE 1 MG/ML IV SOLN: 2.0000 mg | INTRAVENOUS | Status: DC | PRN

## 2014-07-15 MED ORDER — LIDOCAINE HCL (CARDIAC) 20 MG/ML IV SOLN
INTRAVENOUS | Status: DC | PRN
  Administered 2014-07-15: 80 mg via INTRAVENOUS

## 2014-07-15 MED ORDER — ACETAMINOPHEN 650 MG RE SUPP: 325.0000 mg | RECTAL | Status: DC | PRN

## 2014-07-15 MED ORDER — ALLOPURINOL 100 MG PO TABS
100.0000 mg | ORAL_TABLET | Freq: Every day | ORAL | Status: DC
  Administered 2014-07-15: 100 mg via ORAL
  Filled 2014-07-15 (×2): qty 1

## 2014-07-15 MED ORDER — HYDROMORPHONE HCL 1 MG/ML IJ SOLN: 0.2500 mg | INTRAMUSCULAR | Status: DC | PRN

## 2014-07-15 MED ORDER — FENTANYL CITRATE (PF) 100 MCG/2ML IJ SOLN
INTRAMUSCULAR | Status: DC | PRN
  Administered 2014-07-15 (×3): 25 ug via INTRAVENOUS

## 2014-07-15 MED ORDER — HEPARIN SODIUM (PORCINE) 1000 UNIT/ML IJ SOLN
INTRAMUSCULAR | Status: DC | PRN
  Administered 2014-07-15: 5000 [IU] via INTRAVENOUS

## 2014-07-15 MED ORDER — FENTANYL CITRATE (PF) 250 MCG/5ML IJ SOLN
INTRAMUSCULAR | Status: AC
  Filled 2014-07-15: qty 5

## 2014-07-15 MED ORDER — EPHEDRINE SULFATE 50 MG/ML IJ SOLN
INTRAMUSCULAR | Status: DC | PRN
  Administered 2014-07-15: 10 mg via INTRAVENOUS

## 2014-07-15 MED ORDER — AMLODIPINE BESYLATE 10 MG PO TABS
10.0000 mg | ORAL_TABLET | Freq: Every day | ORAL | Status: DC
  Administered 2014-07-15: 10 mg via ORAL
  Filled 2014-07-15 (×2): qty 1

## 2014-07-15 MED ORDER — ONDANSETRON HCL 4 MG/2ML IJ SOLN
INTRAMUSCULAR | Status: AC
  Filled 2014-07-15: qty 2

## 2014-07-15 MED ORDER — LIDOCAINE HCL (PF) 1 % IJ SOLN
INTRAMUSCULAR | Status: DC | PRN
  Administered 2014-07-15: 17 mL via INTRADERMAL

## 2014-07-15 MED ORDER — ONDANSETRON HCL 4 MG/2ML IJ SOLN
INTRAMUSCULAR | Status: DC | PRN
  Administered 2014-07-15: 4 mg via INTRAVENOUS

## 2014-07-15 MED ORDER — CHLORHEXIDINE GLUCONATE CLOTH 2 % EX PADS: 6.0000 | MEDICATED_PAD | Freq: Once | CUTANEOUS | Status: DC

## 2014-07-15 MED ORDER — MORPHINE SULFATE 2 MG/ML IJ SOLN: 2.0000 mg | INTRAMUSCULAR | Status: DC | PRN

## 2014-07-15 MED ORDER — OXYCODONE HCL 5 MG PO TABS: 5.0000 mg | ORAL_TABLET | Freq: Four times a day (QID) | ORAL | Status: DC | PRN

## 2014-07-15 MED ORDER — PROPOFOL 10 MG/ML IV BOLUS
INTRAVENOUS | Status: AC
  Filled 2014-07-15: qty 20

## 2014-07-15 MED ORDER — PROPOFOL INFUSION 10 MG/ML OPTIME
INTRAVENOUS | Status: DC | PRN
  Administered 2014-07-15: 50 ug/kg/min via INTRAVENOUS

## 2014-07-15 MED ORDER — OXYCODONE HCL 5 MG PO TABS
5.0000 mg | ORAL_TABLET | ORAL | Status: DC | PRN
  Administered 2014-07-15: 10 mg via ORAL
  Filled 2014-07-15: qty 2

## 2014-07-15 MED ORDER — SODIUM CHLORIDE 0.9 % IV SOLN
INTRAVENOUS | Status: DC
  Administered 2014-07-15 (×2): via INTRAVENOUS

## 2014-07-15 MED ORDER — DEXTROSE 5 % IV SOLN: 1.5000 g | INTRAVENOUS | Status: DC

## 2014-07-15 SURGICAL SUPPLY — 39 items
BNDG GAUZE ELAST 4 BULKY (GAUZE/BANDAGES/DRESSINGS) ×2 IMPLANT
CANISTER SUCTION 2500CC (MISCELLANEOUS) ×2 IMPLANT
CANNULA VESSEL 3MM 2 BLNT TIP (CANNULA) ×2 IMPLANT
CLIP TI MEDIUM 6 (CLIP) ×2 IMPLANT
CLIP TI WIDE RED SMALL 6 (CLIP) ×2 IMPLANT
COVER PROBE W GEL 5X96 (DRAPES) ×2 IMPLANT
DECANTER SPIKE VIAL GLASS SM (MISCELLANEOUS) ×2 IMPLANT
DRAIN PENROSE 1/4X12 LTX STRL (WOUND CARE) ×2 IMPLANT
ELECT REM PT RETURN 9FT ADLT (ELECTROSURGICAL) ×2
ELECTRODE REM PT RTRN 9FT ADLT (ELECTROSURGICAL) ×1 IMPLANT
GEL ULTRASOUND 20GR AQUASONIC (MISCELLANEOUS) IMPLANT
GLOVE BIO SURGEON STRL SZ7.5 (GLOVE) ×4 IMPLANT
GLOVE BIOGEL PI IND STRL 6.5 (GLOVE) ×1 IMPLANT
GLOVE BIOGEL PI IND STRL 7.0 (GLOVE) ×2 IMPLANT
GLOVE BIOGEL PI IND STRL 8 (GLOVE) ×1 IMPLANT
GLOVE BIOGEL PI INDICATOR 6.5 (GLOVE) ×1
GLOVE BIOGEL PI INDICATOR 7.0 (GLOVE) ×2
GLOVE BIOGEL PI INDICATOR 8 (GLOVE) ×1
GLOVE SS BIOGEL STRL SZ 6.5 (GLOVE) ×1 IMPLANT
GLOVE SUPERSENSE BIOGEL SZ 6.5 (GLOVE) ×1
GOWN STRL REUS W/ TWL LRG LVL3 (GOWN DISPOSABLE) ×3 IMPLANT
GOWN STRL REUS W/TWL LRG LVL3 (GOWN DISPOSABLE) ×3
KIT BASIN OR (CUSTOM PROCEDURE TRAY) ×2 IMPLANT
KIT ROOM TURNOVER OR (KITS) ×2 IMPLANT
LIQUID BAND (GAUZE/BANDAGES/DRESSINGS) ×2 IMPLANT
LOOP VESSEL MINI RED (MISCELLANEOUS) IMPLANT
NS IRRIG 1000ML POUR BTL (IV SOLUTION) ×2 IMPLANT
PACK CV ACCESS (CUSTOM PROCEDURE TRAY) ×2 IMPLANT
PAD ARMBOARD 7.5X6 YLW CONV (MISCELLANEOUS) ×4 IMPLANT
SLEEVE SURGEON STRL (DRAPES) ×2 IMPLANT
SPONGE GAUZE 4X4 12PLY STER LF (GAUZE/BANDAGES/DRESSINGS) ×2 IMPLANT
SPONGE SURGIFOAM ABS GEL 100 (HEMOSTASIS) IMPLANT
SUT PROLENE 7 0 BV 1 (SUTURE) ×2 IMPLANT
SUT VIC AB 3-0 SH 27 (SUTURE) ×1
SUT VIC AB 3-0 SH 27X BRD (SUTURE) ×1 IMPLANT
SUT VICRYL 4-0 PS2 18IN ABS (SUTURE) ×2 IMPLANT
TAPE CLOTH SURG 4X10 WHT LF (GAUZE/BANDAGES/DRESSINGS) ×2 IMPLANT
UNDERPAD 30X30 INCONTINENT (UNDERPADS AND DIAPERS) ×2 IMPLANT
WATER STERILE IRR 1000ML POUR (IV SOLUTION) ×2 IMPLANT

## 2014-07-15 SURGICAL SUPPLY — 32 items
ARMBAND PINK RESTRICT EXTREMIT (MISCELLANEOUS) ×3 IMPLANT
CANISTER SUCTION 2500CC (MISCELLANEOUS) ×3 IMPLANT
CANNULA VESSEL 3MM 2 BLNT TIP (CANNULA) ×3 IMPLANT
CLIP TI MEDIUM 6 (CLIP) ×3 IMPLANT
CLIP TI WIDE RED SMALL 6 (CLIP) ×3 IMPLANT
COVER PROBE W GEL 5X96 (DRAPES) IMPLANT
DECANTER SPIKE VIAL GLASS SM (MISCELLANEOUS) ×3 IMPLANT
DRAIN PENROSE 1/4X12 LTX STRL (WOUND CARE) ×3 IMPLANT
ELECT REM PT RETURN 9FT ADLT (ELECTROSURGICAL) ×3
ELECTRODE REM PT RTRN 9FT ADLT (ELECTROSURGICAL) ×2 IMPLANT
GLOVE BIO SURGEON STRL SZ7.5 (GLOVE) ×3 IMPLANT
GLOVE BIOGEL PI IND STRL 6.5 (GLOVE) ×2 IMPLANT
GLOVE BIOGEL PI INDICATOR 6.5 (GLOVE) ×1
GLOVE SS BIOGEL STRL SZ 6.5 (GLOVE) ×2 IMPLANT
GLOVE SUPERSENSE BIOGEL SZ 6.5 (GLOVE) ×1
GOWN STRL REUS W/ TWL LRG LVL3 (GOWN DISPOSABLE) ×6 IMPLANT
GOWN STRL REUS W/TWL LRG LVL3 (GOWN DISPOSABLE) ×3
KIT BASIN OR (CUSTOM PROCEDURE TRAY) ×3 IMPLANT
KIT ROOM TURNOVER OR (KITS) ×3 IMPLANT
LIQUID BAND (GAUZE/BANDAGES/DRESSINGS) ×6 IMPLANT
LOOP VESSEL MINI RED (MISCELLANEOUS) IMPLANT
NS IRRIG 1000ML POUR BTL (IV SOLUTION) ×3 IMPLANT
PACK CV ACCESS (CUSTOM PROCEDURE TRAY) ×3 IMPLANT
PAD ARMBOARD 7.5X6 YLW CONV (MISCELLANEOUS) ×6 IMPLANT
SPONGE SURGIFOAM ABS GEL 100 (HEMOSTASIS) IMPLANT
SUT PROLENE 6 0 BV (SUTURE) IMPLANT
SUT PROLENE 7 0 BV 1 (SUTURE) IMPLANT
SUT VIC AB 3-0 SH 27 (SUTURE) ×1
SUT VIC AB 3-0 SH 27X BRD (SUTURE) ×2 IMPLANT
SUT VICRYL 4-0 PS2 18IN ABS (SUTURE) ×6 IMPLANT
UNDERPAD 30X30 INCONTINENT (UNDERPADS AND DIAPERS) ×3 IMPLANT
WATER STERILE IRR 1000ML POUR (IV SOLUTION) ×3 IMPLANT

## 2014-07-15 NOTE — Op Note (Signed)
Procedure: Evacuation of hematoma right arm.  Repair Right arm AV fistula Preop: hematoma right arm Postop: same Anesthesia: local with sedation Findings: side branch bleeding from vein  Details: The patient was placed in supine position on the OR table.  After sterile prep and drape of the right arm, local anesthesia was infiltrated at a pre existing antecubital incision.  This was opened and arterial bleeding was encountered.  There was bleeding from a side branch of the cephalic vein used to construct the fistula.  A silk tie had popped off.  The hole was repaired with 2 6 0 prolene figure of 8 sutures.  The wound was thoroughly irrigated and inspected for hemostasis.  The fistula still had good flow on palpation and by doppler.  The subcutaneous tissues were reapproximated with a running 3 0 vicryl followed by a 4 0 vicryl subcuticular stitch.  A dry dressing was applied.  The patient tolerated the procedure well.  The instrument sponge and needle count was correct.  The patient was taken to recovery in stable condition.  Ruta Hinds, MD [facility] [place] Office: [phone] Pager: [phone]

## 2014-07-15 NOTE — Transfer of Care (Signed)
Immediate Anesthesia Transfer of Care Note  Patient: Eric [name]  Procedure(s) Performed: Procedure(s): ARTERIOVENOUS (AV) FISTULA CREATION (Right) LIGATION OF ARTERIOVENOUS  FISTULA  (RIGHT RADIOCEPHALIC) (Right)  Patient Location: PACU  Anesthesia Type:MAC  Level of Consciousness: awake, alert  and oriented  Airway & Oxygen Therapy: Patient Spontanous Breathing and Patient connected to nasal cannula oxygen  Post-op Assessment: Report given to RN and Post -op Vital signs reviewed and stable  Post vital signs: Reviewed and stable  Last Vitals:  Filed Vitals:   07/15/14 0920  BP: 164/69  Pulse: 66  Temp: 36.2 C  Resp: 20    Complications: No apparent anesthesia complications

## 2014-07-15 NOTE — Interval H&P Note (Signed)
History and Physical Interval Note:  07/15/2014 10:05 AM  Eric [name]  has presented today for surgery, with the diagnosis of End Stage Renal Disease N18.6  The various methods of treatment have been discussed with the patient and family. After consideration of risks, benefits and other options for treatment, the patient has consented to  Procedure(s): ARTERIOVENOUS (AV) FISTULA CREATION (Right) as a surgical intervention .  The patient's history has been reviewed, patient examined, no change in status, stable for surgery.  I have reviewed the patient's chart and labs.  Questions were answered to the patient's satisfaction.     Ruta Hinds

## 2014-07-15 NOTE — OR Nursing (Signed)
Dialysis Access Record faxed to [facility].

## 2014-07-15 NOTE — Anesthesia Postprocedure Evaluation (Signed)
Anesthesia Post Note  Patient: Eric [name]  Procedure(s) Performed: Procedure(s) (LRB): EXPLORATION OF ARTERIOVENOUS FISTULA (Right)  Anesthesia type: MAC  Patient location: PACU  Post pain: Pain level controlled  Post assessment: Post-op Vital signs reviewed  Last Vitals: BP 104/68 mmHg  Pulse 60  Temp(Src) 36.6 C  Resp 14  Wt 172 lb (78.019 kg)  SpO2 97%  Post vital signs: Reviewed  Level of consciousness: awake  Complications: No apparent anesthesia complications

## 2014-07-15 NOTE — Anesthesia Postprocedure Evaluation (Addendum)
Anesthesia Post Note  Patient: Eric [name]  Procedure(s) Performed: Procedure(s) (LRB): ARTERIOVENOUS (AV) FISTULA CREATION (Right) LIGATION OF ARTERIOVENOUS  FISTULA  (RIGHT RADIOCEPHALIC) (Right)  Anesthesia type: MAC  Patient location: PACU  Post pain: Pain level controlled  Post assessment: Post-op Vital signs reviewed  Last Vitals: BP 104/68 mmHg  Pulse 60  Temp(Src) 36.6 C  Resp 14  Wt 172 lb (78.019 kg)  SpO2 97%  Post vital signs: Reviewed  Level of consciousness: sedated  Complications: No apparent anesthesia complications

## 2014-07-15 NOTE — Op Note (Signed)
Procedure: Right Brachial Cephalic AV fistula and ligation of right radial cephalic AVF  Preop: ESRD  Postop: ESRD  Anesthesia: Local with IV sedation  Assistant: Nurse  Findings: 3 mm cephalic vein  Procedure: After obtaining informed consent, the patient was taken to the operating room. the right upper extremity was prepped and draped in usual sterile fashion.  Local anesthesia was infiltrated near the antecubital crease.  A transverse incision was then made near the antecubital crease the right arm. The incision was carried into the subcutaneous tissues down to level of the cephalic vein. The cephalic vein was approximately 3 mm in diameter. It was of good quality. This was dissected free circumferentially and small side branches ligated and divided between silk ties or clips. Next the brachial artery was dissected free in the medial portion of the incision. The artery was 3 mm in diameter. The vessel loops were placed proximal and distal to the planned site of arteriotomy. The patient was given 5000 units of intravenous heparin. After appropriate circulation time, the vessel loops were used to control the artery. A longitudinal opening was made in the brachial artery.  The vein was ligated distally with a 2-0 silk tie. The vein was controlled proximally with a fine bulldog clamp. The vein was then swung over to the artery and sewn end of vein to side of artery using a running 7-0 Prolene suture. Just prior to completion of the anastomosis, everything was fore bled back bled and thoroughly flushed. The anastomosis was secured, vessel loops released, and there was a palpable thrill in the fistula immediately. After hemostasis was obtained, the subcutaneous tissues were reapproximated using a running 3-0 Vicryl suture. The skin was then closed with a 4 0 Vicryl subcuticular stitch. Dermabond was applied to the skin incision.  Next attention was turned to a pre-existing right radial cephalic AVF  A  longitudinal incision was made through a pre-existing scar near the wrist.  This was carried through the subcutaneous tissue down to the fistula.  It was dissected free circumferentially and ligated with a 3 0 silk tie.    Ruta Hinds, MD [facility] [place] Office: [phone] Pager: [phone]

## 2014-07-15 NOTE — Transfer of Care (Signed)
Immediate Anesthesia Transfer of Care Note  Patient: Eric [name]  Procedure(s) Performed: Procedure(s): EXPLORATION OF ARTERIOVENOUS FISTULA (Right)  Patient Location: PACU  Anesthesia Type:MAC  Level of Consciousness: awake, alert  and oriented  Airway & Oxygen Therapy: Patient Spontanous Breathing and Patient connected to nasal cannula oxygen  Post-op Assessment: Report given to RN, Post -op Vital signs reviewed and stable and Patient moving all extremities X 4  Post vital signs: Reviewed and stable  Last Vitals:  Filed Vitals:   07/15/14 1225  BP: 117/56  Pulse: 56  Temp:   Resp: 10    Complications: No apparent anesthesia complications

## 2014-07-15 NOTE — Anesthesia Preprocedure Evaluation (Signed)
Anesthesia Evaluation  Patient identified by MRN, date of birth, ID band Patient awake    Reviewed: Allergy & Precautions, NPO status , Patient's Chart, lab work & pertinent test results  History of Anesthesia Complications Negative for: history of anesthetic complications  Airway Mallampati: II  TM Distance: >3 FB Neck ROM: Full    Dental  (+) Dental Advisory Given, Poor Dentition, Missing   Pulmonary shortness of breath and with exertion,  breath sounds clear to auscultation        Cardiovascular hypertension, Pt. on medications + Valvular Problems/Murmurs Rhythm:Regular     Neuro/Psych Anxiety negative neurological ROS     GI/Hepatic Neg liver ROS, GERD-  ,  Endo/Other  negative endocrine ROS  Renal/GU Dialysis and ESRFRenal disease     Musculoskeletal  (+) Arthritis -,   Abdominal   Peds  Hematology   Anesthesia Other Findings   Reproductive/Obstetrics                             Anesthesia Physical  Anesthesia Plan  ASA: III  Anesthesia Plan: MAC   Post-op Pain Management:    Induction:   Airway Management Planned: Simple Face Mask  Additional Equipment:   Intra-op Plan:   Post-operative Plan:   Informed Consent: I have reviewed the patients History and Physical, chart, labs and discussed the procedure including the risks, benefits and alternatives for the proposed anesthesia with the patient or authorized representative who has indicated his/her understanding and acceptance.   Dental advisory given  Plan Discussed with: CRNA  Anesthesia Plan Comments:         Anesthesia Quick Evaluation

## 2014-07-15 NOTE — H&P (View-Only) (Signed)
[facility] [place] HISTORY AND PHYSICAL   History of Present Illness:  Patient is a 81 y.o. year old male who presents for placement of a permanent hemodialysis access. .  The patient is currently on hemodialysis via a right-sided catheter.  He has previously had a left radiocephalic AV fistula which failed. He currently has a right radiocephalic AV fistula which has been revised recently with side branch ligation but they are unable to cannulate.  Other chronic medical problems include hypertension, arthritis, and reflux all of which are currently stable..  Past Medical History  Diagnosis Date  . CKD (chronic kidney disease) stage 5, GFR less than 15 ml/min   . Unspecified essential hypertension   . GERD (gastroesophageal reflux disease)   . Gout   . Herpes zoster 04/2012  . Complication of anesthesia     " one time my heart stopped due to a medication that I was on."   . Heart murmur     as a teen  . Shortness of breath dyspnea   . Anxiety   . Arthritis     Past Surgical History  Procedure Laterality Date  . Appendectomy  1944  . Cataract extraction w/ intraocular lens implant Bilateral 2014    [place]  . Dialysis fistula creation  08/04/2012 and 10/13    Dr. Harden Mo  . Colonoscopy  2013    Dr. Annamaria Helling [place], [place].  Marland Kitchen Av fistula placement Left     Left Cimino AVF placed in [place]   . Insertion of dialysis catheter Right 01-15-14    Right chest TDC placed by Dr. Augustin Coupe at [facility]  . Eye surgery      Cataract  . Av fistula placement Right 03/06/2014    Procedure: ARTERIOVENOUS (AV) FISTULA CREATION-right radiocephalic;  Surgeon: Mal Misty, MD;  Location: [facility] OR;  Service: Vascular;  Laterality: Right;  . Ligation of competing branches of arteriovenous fistula Right 05/08/2014    Procedure: LIGATION OF COMPETING BRANCHES OF RIGHT ARM RADIOCEPHALIC ARTERIOVENOUS FISTULA;  Surgeon: Mal Misty, MD;  Location: [facility];  Service:  Vascular;  Laterality: Right;     Social History History  Substance Use Topics  . Smoking status: Never Smoker   . Smokeless tobacco: Never Used  . Alcohol Use: 1.2 oz/week    1 Glasses of wine, 1 Shots of liquor per week     Comment: one glass a day of either wine or liquor    Family History Family History  Problem Relation Age of Onset  . Cancer Father     lung    Allergies  No Known Allergies   Current Outpatient Prescriptions  Medication Sig Dispense Refill  . allopurinol (ZYLOPRIM) 100 MG tablet Take 1 tablet (100 mg total) by mouth daily. 90 tablet 3  . amLODipine (NORVASC) 10 MG tablet Take one tablet by mouth once daily to control blood pressure (Patient taking differently: Take 10 mg by mouth daily. ) 30 tablet 3  . aspirin EC 81 MG tablet Take 81 mg by mouth daily.    Marland Kitchen atorvastatin (LIPITOR) 20 MG tablet Take 1 tablet (20 mg total) by mouth daily. (Patient taking differently: Take 20 mg by mouth at bedtime. ) 30 tablet 3  . Melatonin 10 MG TABS Take 10 mg by mouth at bedtime.     . multivitamin (RENA-VIT) TABS tablet Take 1 tablet by mouth daily.     Marland Kitchen omeprazole (PRILOSEC) 20 MG  capsule Take 20 mg by mouth 4 (four) times a week. Sunday, Tuesday, Thursday and Saturday    . Vitamin D, Ergocalciferol, (DRISDOL) 50000 UNITS CAPS capsule Take 50,000 Units by mouth every 14 (fourteen) days. 1st and 15th of each month    . oxyCODONE-acetaminophen (ROXICET) 5-325 MG per tablet Take 1 tablet by mouth every 6 (six) hours as needed for severe pain. (Patient not taking: Reported on 07/11/2014) 30 tablet 0   No current facility-administered medications for this visit.    ROS:   General:  No weight loss, Fever, chills  HEENT: No recent headaches, no nasal bleeding, no visual changes, no sore throat  Neurologic: No dizziness, blackouts, seizures. No recent symptoms of stroke or mini- stroke. No recent episodes of slurred speech, or temporary blindness.  Cardiac: No recent  episodes of chest pain/pressure, no shortness of breath at rest.  + shortness of breath with exertion.  Denies history of atrial fibrillation or irregular heartbeat  Vascular: No history of rest pain in feet.  No history of claudication.  No history of non-healing ulcer, No history of DVT   Pulmonary: No home oxygen, no productive cough, no hemoptysis,  No asthma or wheezing  Musculoskeletal:  [ ]  Arthritis, [ ]  Low back pain,  [ ]  Joint pain  Hematologic:No history of hypercoagulable state.  No history of easy bleeding.  No history of anemia  Gastrointestinal: No hematochezia or melena,  + gastroesophageal reflux, no trouble swallowing  Urinary: [ ]  chronic Kidney disease, [x ] on HD - [ ]  MWF or [x ] TTHS, [ ]  Burning with urination, [ ]  Frequent urination, [ ]  Difficulty urinating;   Skin: No rashes  Psychological: No history of anxiety,  No history of depression   Physical Examination  Filed Vitals:   07/11/14 1546  BP: 104/72  Pulse: 73  Height: 5' 9.5" (1.765 m)  Weight: 172 lb (78.019 kg)  SpO2: 98%    Body mass index is 25.04 kg/(m^2).  General:  Alert and oriented, no acute distress HEENT: Normal Neck: No bruit or JVD Pulmonary: Clear to auscultation bilaterally Cardiac: Regular Rate and Rhythm without murmur Gastrointestinal: Soft, non-tender, non-distended, no mass, no scars Skin: No rash Extremity Pulses:  2+ radial, brachial pulses bilaterally, audible bruit and right arm AV fistula but fistula overall is small Musculoskeletal: No deformity or edema  Neurologic: Upper and lower extremity motor 5/5 and symmetric  DATA: Fistula diameter 2-4 mm on ultrasound today. I reviewed and interpreted this study.   ASSESSMENT: Patient with non-maturing AV fistula right radiocephalic it was placed 4 months ago has failed to mature despite side branch ligation.   PLAN:  Right upper arm AV fistula scheduled for 07/15/2014. Risks benefits possible palpitations and  procedure details were discussed the patient today including not limited to bleeding infection non-maturation of AV fistula. He understands and agrees to proceed.  Ruta Hinds, MD [facility] [place] Office: [phone] Pager: [phone]

## 2014-07-15 NOTE — Progress Notes (Signed)
Patient received from OR, Swelling noted at surgical site with slight oozing noted; thrill and bruit noted; no c/o discomfort; Dr. Oneida Alar notified. Patient to return to OR. See flowsheet for vital signs.

## 2014-07-15 NOTE — Discharge Instructions (Signed)
° ° °  07/15/2014 Eric [name] [id number] 08/15/1930  Surgeon(s): Elam Dutch, MD  Procedure(s): Right brachiocephalic AV fistula creation Ligation of right radial cephalic AV fistula   x Do not stick fistula for 12 weeks

## 2014-07-16 ENCOUNTER — Encounter (HOSPITAL_COMMUNITY): Payer: Self-pay | Admitting: Vascular Surgery

## 2014-07-16 ENCOUNTER — Telehealth: Payer: Self-pay | Admitting: Vascular Surgery

## 2014-07-16 DIAGNOSIS — N2581 Secondary hyperparathyroidism of renal origin: Secondary | ICD-10-CM | POA: Diagnosis not present

## 2014-07-16 DIAGNOSIS — Z992 Dependence on renal dialysis: Secondary | ICD-10-CM | POA: Diagnosis not present

## 2014-07-16 DIAGNOSIS — T82590A Other mechanical complication of surgically created arteriovenous fistula, initial encounter: Secondary | ICD-10-CM | POA: Diagnosis not present

## 2014-07-16 DIAGNOSIS — N186 End stage renal disease: Secondary | ICD-10-CM | POA: Diagnosis not present

## 2014-07-16 DIAGNOSIS — D631 Anemia in chronic kidney disease: Secondary | ICD-10-CM | POA: Diagnosis not present

## 2014-07-16 DIAGNOSIS — N184 Chronic kidney disease, stage 4 (severe): Secondary | ICD-10-CM | POA: Diagnosis not present

## 2014-07-16 DIAGNOSIS — L7622 Postprocedural hemorrhage and hematoma of skin and subcutaneous tissue following other procedure: Secondary | ICD-10-CM | POA: Diagnosis not present

## 2014-07-16 DIAGNOSIS — M1990 Unspecified osteoarthritis, unspecified site: Secondary | ICD-10-CM | POA: Diagnosis not present

## 2014-07-16 DIAGNOSIS — D509 Iron deficiency anemia, unspecified: Secondary | ICD-10-CM | POA: Diagnosis not present

## 2014-07-16 DIAGNOSIS — I120 Hypertensive chronic kidney disease with stage 5 chronic kidney disease or end stage renal disease: Secondary | ICD-10-CM | POA: Diagnosis not present

## 2014-07-16 LAB — BASIC METABOLIC PANEL
Anion gap: 14 (ref 5–15)
BUN: 56 mg/dL — ABNORMAL HIGH (ref 6–20)
CHLORIDE: 102 mmol/L (ref 101–111)
CO2: 22 mmol/L (ref 22–32)
Calcium: 9.3 mg/dL (ref 8.9–10.3)
Creatinine, Ser: 8.01 mg/dL — ABNORMAL HIGH (ref 0.61–1.24)
GFR calc non Af Amer: 5 mL/min — ABNORMAL LOW (ref >60)
GFR, EST AFRICAN AMERICAN: 6 mL/min — AB (ref >60)
GLUCOSE: 109 mg/dL — AB (ref 65–99)
Potassium: 4.4 mmol/L (ref 3.5–5.1)
SODIUM: 138 mmol/L (ref 135–145)

## 2014-07-16 LAB — CBC
HCT: 30.3 % — ABNORMAL LOW (ref 39.0–52.0)
HEMOGLOBIN: 10 g/dL — AB (ref 13.0–17.0)
MCH: 32.4 pg (ref 26.0–34.0)
MCHC: 33 g/dL (ref 30.0–36.0)
MCV: 98.1 fL (ref 78.0–100.0)
PLATELETS: 227 10*3/uL (ref 150–400)
RBC: 3.09 MIL/uL — ABNORMAL LOW (ref 4.22–5.81)
RDW: 14.4 % (ref 11.5–15.5)
WBC: 7.6 10*3/uL (ref 4.0–10.5)

## 2014-07-16 NOTE — Telephone Encounter (Addendum)
-----   Message from Denman George, RN sent at 07/15/2014 12:12 PM EDT ----- Regarding: Zigmund Daniel log; also needs 4 wk. f/u with CEF and access duplex of right arm   ----- Message -----    From: Gabriel Earing, PA-C    Sent: 07/15/2014  12:01 PM      To: Vvs Charge Pool  S/p right BC AVF and ligation of right RC AVF 07/15/14.  F/u with Dr Oneida Alar in 4 weeks with duplex.  Thanks, Samantha  notified patient of post op appt. on 08-29-14 at 2pm for a lab then 3pm to see dr. Oneida Alar

## 2014-07-16 NOTE — Discharge Summary (Signed)
Discharge Summary    Eric [name] 18-Nov-1930 81 y.o. male  [id number]  Admission Date: 07/15/2014  Discharge Date: 07/16/14  Physician: Elam Dutch, MD  Admission Diagnosis: End Stage Renal Disease N18.6 HEMATOMA   HPI:   This is a 81 y.o. male who presents for placement of a permanent hemodialysis access. . The patient is currently on hemodialysis via a right-sided catheter. He has previously had a left radiocephalic AV fistula which failed. He currently has a right radiocephalic AV fistula which has been revised recently with side branch ligation but they are unable to cannulate. Other chronic medical problems include hypertension, arthritis, and reflux all of which are currently stable.Marland Kitchen  Hospital Course:  The patient was admitted to the hospital and taken to the operating room on 07/15/2014 and underwent: Right Brachial Cephalic AV fistula and ligation of right radial cephalic AVF.    The pt tolerated the procedure well and was transported to the PACU in good condition.   In the PACU, the pt was noted to have swelling at the surgical site.  Dr. Oneida Alar was notified and the pt returned to the OR for evacuation of hematoma of right arm and repair of right arm AVF.  Pt tolerated well and was admitted to the hospital overnight for observation.  By POD 1, his dressing was removed.  He did have a thrill/bruit within his fistula.  There was no active bleeding.  He did have some ecchymosis of the skin.  Motor and sensation were in tact.  He is discharged and will receive HD at his regular center later in the day (1230)  The remainder of the hospital course consisted of increasing mobilization and increasing intake of solids without difficulty.  CBC    Component Value Date/Time   WBC 4.5 02/28/2014   HGB 12.9* 07/15/2014 0944   HCT 38.0* 07/15/2014 0944   PLT 225 01/24/2014    BMET    Component Value Date/Time   NA 140 07/15/2014 0944   NA 140 03/01/2014   K  5.1 07/15/2014 0944   GLUCOSE 110* 07/15/2014 0944   BUN 98* 05/17/2013   CREATININE 6.9* 03/01/2014      Discharge Instructions    Call MD for:  redness, tenderness, or signs of infection (pain, swelling, bleeding, redness, odor or green/yellow discharge around incision site)    Complete by:  As directed      Call MD for:  severe or increased pain, loss or decreased feeling  in affected limb(s)    Complete by:  As directed      Call MD for:  temperature >100.5    Complete by:  As directed      Driving Restrictions    Complete by:  As directed   No driving for 24 hours and while taking pain medication.     Lifting restrictions    Complete by:  As directed   No lifting for 3 weeks     Resume previous diet    Complete by:  As directed      may wash over wound with mild soap and water    Complete by:  As directed            Discharge Diagnosis:  End Stage Renal Disease N18.6 HEMATOMA  Secondary Diagnosis: Patient Active Problem List   Diagnosis Date Noted  . ESRD (end stage renal disease) 07/15/2014  . Insomnia, unspecified 09/24/2013  . Pain in neck 09/24/2013  . Cough 06/04/2013  . Gout 06/04/2013  .  ESRD on dialysis 06/04/2013  . Hypertension   . GERD (gastroesophageal reflux disease)   . Herpes zoster 06/04/2008   Past Medical History  Diagnosis Date  . CKD (chronic kidney disease) stage 5, GFR less than 15 ml/min   . Unspecified essential hypertension   . GERD (gastroesophageal reflux disease)   . Gout   . Herpes zoster 04/2012  . Heart murmur     as a teen  . Shortness of breath dyspnea   . Anxiety   . Arthritis   . Complication of anesthesia     " one time my heart stopped due to a medication that I was on."        Medication List    TAKE these medications        allopurinol 100 MG tablet  Commonly known as:  ZYLOPRIM  Take 1 tablet (100 mg total) by mouth daily.     amLODipine 10 MG tablet  Commonly known as:  NORVASC  Take one tablet by  mouth once daily to control blood pressure     aspirin EC 81 MG tablet  Take 81 mg by mouth at bedtime.     atorvastatin 20 MG tablet  Commonly known as:  LIPITOR  Take 1 tablet (20 mg total) by mouth daily.     Melatonin 10 MG Tabs  Take 10 mg by mouth at bedtime.     multivitamin Tabs tablet  Take 1 tablet by mouth daily.     omeprazole 20 MG capsule  Commonly known as:  PRILOSEC  Take 20 mg by mouth 4 (four) times a week. Sunday, Tuesday, Thursday and Saturday     Vitamin D (Ergocalciferol) 50000 UNITS Caps capsule  Commonly known as:  DRISDOL  Take 50,000 Units by mouth every 14 (fourteen) days. 1st and 15th of each month        Prescriptions given: None-pt has pain medication at home  Instructions: 1.  No driving x 24 hours and while taking pain medication  Disposition: home and to his HD center 07/16/14 at 1230 for HD  Patient's condition: is Good  Follow up: 1. Dr. Oneida Alar in 4 weeks with duplex of fistula   Leontine Locket, PA-C [facility] [phone] 07/16/2014  8:08 AM

## 2014-07-16 NOTE — Telephone Encounter (Signed)
xxx

## 2014-07-16 NOTE — Progress Notes (Signed)
[facility] [place]  Subjective  - feels ok some arm soreness   Objective 134/61 74 98 F (36.7 C) (Oral) 18 98%  Intake/Output Summary (Last 24 hours) at 07/16/14 0818 Last data filed at 07/15/14 1800  Gross per 24 hour  Intake    810 ml  Output    100 ml  Net    710 ml    + thrill in fistula some skin ecchymosis and forearm edema, no numbness in hand  Assessment/Planning: D/c home  Follow up  1 month Dialysis at his regular center later today  Ruta Hinds 07/16/2014 8:18 AM --  Laboratory Lab Results:  Recent Labs  07/15/14 0944 07/16/14 0710  WBC  --  7.6  HGB 12.9* 10.0*  HCT 38.0* 30.3*  PLT  --  227   BMET  Recent Labs  07/15/14 0944  NA 140  K 5.1  GLUCOSE 110*    COAG No results found for: INR, PROTIME No results found for: PTT

## 2014-07-18 DIAGNOSIS — D509 Iron deficiency anemia, unspecified: Secondary | ICD-10-CM | POA: Diagnosis not present

## 2014-07-18 DIAGNOSIS — D631 Anemia in chronic kidney disease: Secondary | ICD-10-CM | POA: Diagnosis not present

## 2014-07-18 DIAGNOSIS — N186 End stage renal disease: Secondary | ICD-10-CM | POA: Diagnosis not present

## 2014-07-18 DIAGNOSIS — N184 Chronic kidney disease, stage 4 (severe): Secondary | ICD-10-CM | POA: Diagnosis not present

## 2014-07-18 DIAGNOSIS — N2581 Secondary hyperparathyroidism of renal origin: Secondary | ICD-10-CM | POA: Diagnosis not present

## 2014-07-20 DIAGNOSIS — N2581 Secondary hyperparathyroidism of renal origin: Secondary | ICD-10-CM | POA: Diagnosis not present

## 2014-07-20 DIAGNOSIS — D631 Anemia in chronic kidney disease: Secondary | ICD-10-CM | POA: Diagnosis not present

## 2014-07-20 DIAGNOSIS — N186 End stage renal disease: Secondary | ICD-10-CM | POA: Diagnosis not present

## 2014-07-20 DIAGNOSIS — D509 Iron deficiency anemia, unspecified: Secondary | ICD-10-CM | POA: Diagnosis not present

## 2014-07-20 DIAGNOSIS — N184 Chronic kidney disease, stage 4 (severe): Secondary | ICD-10-CM | POA: Diagnosis not present

## 2014-07-21 ENCOUNTER — Other Ambulatory Visit: Payer: Self-pay | Admitting: Internal Medicine

## 2014-07-23 DIAGNOSIS — D631 Anemia in chronic kidney disease: Secondary | ICD-10-CM | POA: Diagnosis not present

## 2014-07-23 DIAGNOSIS — N186 End stage renal disease: Secondary | ICD-10-CM | POA: Diagnosis not present

## 2014-07-23 DIAGNOSIS — D509 Iron deficiency anemia, unspecified: Secondary | ICD-10-CM | POA: Diagnosis not present

## 2014-07-23 DIAGNOSIS — N184 Chronic kidney disease, stage 4 (severe): Secondary | ICD-10-CM | POA: Diagnosis not present

## 2014-07-23 DIAGNOSIS — N2581 Secondary hyperparathyroidism of renal origin: Secondary | ICD-10-CM | POA: Diagnosis not present

## 2014-07-25 DIAGNOSIS — N184 Chronic kidney disease, stage 4 (severe): Secondary | ICD-10-CM | POA: Diagnosis not present

## 2014-07-25 DIAGNOSIS — N2581 Secondary hyperparathyroidism of renal origin: Secondary | ICD-10-CM | POA: Diagnosis not present

## 2014-07-25 DIAGNOSIS — N186 End stage renal disease: Secondary | ICD-10-CM | POA: Diagnosis not present

## 2014-07-25 DIAGNOSIS — D509 Iron deficiency anemia, unspecified: Secondary | ICD-10-CM | POA: Diagnosis not present

## 2014-07-25 DIAGNOSIS — D631 Anemia in chronic kidney disease: Secondary | ICD-10-CM | POA: Diagnosis not present

## 2014-07-27 DIAGNOSIS — N2581 Secondary hyperparathyroidism of renal origin: Secondary | ICD-10-CM | POA: Diagnosis not present

## 2014-07-27 DIAGNOSIS — D631 Anemia in chronic kidney disease: Secondary | ICD-10-CM | POA: Diagnosis not present

## 2014-07-27 DIAGNOSIS — N186 End stage renal disease: Secondary | ICD-10-CM | POA: Diagnosis not present

## 2014-07-27 DIAGNOSIS — N184 Chronic kidney disease, stage 4 (severe): Secondary | ICD-10-CM | POA: Diagnosis not present

## 2014-07-27 DIAGNOSIS — D509 Iron deficiency anemia, unspecified: Secondary | ICD-10-CM | POA: Diagnosis not present

## 2014-07-30 ENCOUNTER — Other Ambulatory Visit: Payer: Self-pay | Admitting: *Deleted

## 2014-07-30 DIAGNOSIS — N2581 Secondary hyperparathyroidism of renal origin: Secondary | ICD-10-CM | POA: Diagnosis not present

## 2014-07-30 DIAGNOSIS — D509 Iron deficiency anemia, unspecified: Secondary | ICD-10-CM | POA: Diagnosis not present

## 2014-07-30 DIAGNOSIS — N186 End stage renal disease: Secondary | ICD-10-CM | POA: Diagnosis not present

## 2014-07-30 DIAGNOSIS — D631 Anemia in chronic kidney disease: Secondary | ICD-10-CM | POA: Diagnosis not present

## 2014-07-30 DIAGNOSIS — N184 Chronic kidney disease, stage 4 (severe): Secondary | ICD-10-CM | POA: Diagnosis not present

## 2014-07-30 MED ORDER — AMLODIPINE BESYLATE 10 MG PO TABS: ORAL_TABLET | ORAL | Status: DC

## 2014-07-30 NOTE — Telephone Encounter (Signed)
Patient requested 90 day supply to be faxed to [facility]

## 2014-08-01 DIAGNOSIS — N184 Chronic kidney disease, stage 4 (severe): Secondary | ICD-10-CM | POA: Diagnosis not present

## 2014-08-01 DIAGNOSIS — D631 Anemia in chronic kidney disease: Secondary | ICD-10-CM | POA: Diagnosis not present

## 2014-08-01 DIAGNOSIS — D509 Iron deficiency anemia, unspecified: Secondary | ICD-10-CM | POA: Diagnosis not present

## 2014-08-01 DIAGNOSIS — N2581 Secondary hyperparathyroidism of renal origin: Secondary | ICD-10-CM | POA: Diagnosis not present

## 2014-08-01 DIAGNOSIS — N186 End stage renal disease: Secondary | ICD-10-CM | POA: Diagnosis not present

## 2014-08-03 DIAGNOSIS — N2581 Secondary hyperparathyroidism of renal origin: Secondary | ICD-10-CM | POA: Diagnosis not present

## 2014-08-03 DIAGNOSIS — N186 End stage renal disease: Secondary | ICD-10-CM | POA: Diagnosis not present

## 2014-08-03 DIAGNOSIS — D631 Anemia in chronic kidney disease: Secondary | ICD-10-CM | POA: Diagnosis not present

## 2014-08-03 DIAGNOSIS — N184 Chronic kidney disease, stage 4 (severe): Secondary | ICD-10-CM | POA: Diagnosis not present

## 2014-08-03 DIAGNOSIS — D509 Iron deficiency anemia, unspecified: Secondary | ICD-10-CM | POA: Diagnosis not present

## 2014-08-04 DIAGNOSIS — Z992 Dependence on renal dialysis: Secondary | ICD-10-CM | POA: Diagnosis not present

## 2014-08-04 DIAGNOSIS — N2889 Other specified disorders of kidney and ureter: Secondary | ICD-10-CM | POA: Diagnosis not present

## 2014-08-04 DIAGNOSIS — N186 End stage renal disease: Secondary | ICD-10-CM | POA: Diagnosis not present

## 2014-08-06 DIAGNOSIS — N2581 Secondary hyperparathyroidism of renal origin: Secondary | ICD-10-CM | POA: Diagnosis not present

## 2014-08-06 DIAGNOSIS — D509 Iron deficiency anemia, unspecified: Secondary | ICD-10-CM | POA: Diagnosis not present

## 2014-08-06 DIAGNOSIS — D631 Anemia in chronic kidney disease: Secondary | ICD-10-CM | POA: Diagnosis not present

## 2014-08-06 DIAGNOSIS — N184 Chronic kidney disease, stage 4 (severe): Secondary | ICD-10-CM | POA: Diagnosis not present

## 2014-08-06 DIAGNOSIS — N186 End stage renal disease: Secondary | ICD-10-CM | POA: Diagnosis not present

## 2014-08-08 DIAGNOSIS — N186 End stage renal disease: Secondary | ICD-10-CM | POA: Diagnosis not present

## 2014-08-08 DIAGNOSIS — N2581 Secondary hyperparathyroidism of renal origin: Secondary | ICD-10-CM | POA: Diagnosis not present

## 2014-08-08 DIAGNOSIS — N184 Chronic kidney disease, stage 4 (severe): Secondary | ICD-10-CM | POA: Diagnosis not present

## 2014-08-08 DIAGNOSIS — D509 Iron deficiency anemia, unspecified: Secondary | ICD-10-CM | POA: Diagnosis not present

## 2014-08-08 DIAGNOSIS — D631 Anemia in chronic kidney disease: Secondary | ICD-10-CM | POA: Diagnosis not present

## 2014-08-10 DIAGNOSIS — D631 Anemia in chronic kidney disease: Secondary | ICD-10-CM | POA: Diagnosis not present

## 2014-08-10 DIAGNOSIS — N186 End stage renal disease: Secondary | ICD-10-CM | POA: Diagnosis not present

## 2014-08-10 DIAGNOSIS — N184 Chronic kidney disease, stage 4 (severe): Secondary | ICD-10-CM | POA: Diagnosis not present

## 2014-08-10 DIAGNOSIS — D509 Iron deficiency anemia, unspecified: Secondary | ICD-10-CM | POA: Diagnosis not present

## 2014-08-10 DIAGNOSIS — N2581 Secondary hyperparathyroidism of renal origin: Secondary | ICD-10-CM | POA: Diagnosis not present

## 2014-08-13 DIAGNOSIS — D631 Anemia in chronic kidney disease: Secondary | ICD-10-CM | POA: Diagnosis not present

## 2014-08-13 DIAGNOSIS — N2581 Secondary hyperparathyroidism of renal origin: Secondary | ICD-10-CM | POA: Diagnosis not present

## 2014-08-13 DIAGNOSIS — D509 Iron deficiency anemia, unspecified: Secondary | ICD-10-CM | POA: Diagnosis not present

## 2014-08-13 DIAGNOSIS — N186 End stage renal disease: Secondary | ICD-10-CM | POA: Diagnosis not present

## 2014-08-13 DIAGNOSIS — N184 Chronic kidney disease, stage 4 (severe): Secondary | ICD-10-CM | POA: Diagnosis not present

## 2014-08-15 DIAGNOSIS — N2581 Secondary hyperparathyroidism of renal origin: Secondary | ICD-10-CM | POA: Diagnosis not present

## 2014-08-15 DIAGNOSIS — N184 Chronic kidney disease, stage 4 (severe): Secondary | ICD-10-CM | POA: Diagnosis not present

## 2014-08-15 DIAGNOSIS — D509 Iron deficiency anemia, unspecified: Secondary | ICD-10-CM | POA: Diagnosis not present

## 2014-08-15 DIAGNOSIS — D631 Anemia in chronic kidney disease: Secondary | ICD-10-CM | POA: Diagnosis not present

## 2014-08-15 DIAGNOSIS — N186 End stage renal disease: Secondary | ICD-10-CM | POA: Diagnosis not present

## 2014-08-17 DIAGNOSIS — D509 Iron deficiency anemia, unspecified: Secondary | ICD-10-CM | POA: Diagnosis not present

## 2014-08-17 DIAGNOSIS — D631 Anemia in chronic kidney disease: Secondary | ICD-10-CM | POA: Diagnosis not present

## 2014-08-17 DIAGNOSIS — N184 Chronic kidney disease, stage 4 (severe): Secondary | ICD-10-CM | POA: Diagnosis not present

## 2014-08-17 DIAGNOSIS — N2581 Secondary hyperparathyroidism of renal origin: Secondary | ICD-10-CM | POA: Diagnosis not present

## 2014-08-17 DIAGNOSIS — N186 End stage renal disease: Secondary | ICD-10-CM | POA: Diagnosis not present

## 2014-08-20 DIAGNOSIS — D509 Iron deficiency anemia, unspecified: Secondary | ICD-10-CM | POA: Diagnosis not present

## 2014-08-20 DIAGNOSIS — N2581 Secondary hyperparathyroidism of renal origin: Secondary | ICD-10-CM | POA: Diagnosis not present

## 2014-08-20 DIAGNOSIS — N186 End stage renal disease: Secondary | ICD-10-CM | POA: Diagnosis not present

## 2014-08-20 DIAGNOSIS — N184 Chronic kidney disease, stage 4 (severe): Secondary | ICD-10-CM | POA: Diagnosis not present

## 2014-08-20 DIAGNOSIS — D631 Anemia in chronic kidney disease: Secondary | ICD-10-CM | POA: Diagnosis not present

## 2014-08-22 DIAGNOSIS — N2581 Secondary hyperparathyroidism of renal origin: Secondary | ICD-10-CM | POA: Diagnosis not present

## 2014-08-22 DIAGNOSIS — N186 End stage renal disease: Secondary | ICD-10-CM | POA: Diagnosis not present

## 2014-08-22 DIAGNOSIS — D509 Iron deficiency anemia, unspecified: Secondary | ICD-10-CM | POA: Diagnosis not present

## 2014-08-22 DIAGNOSIS — N184 Chronic kidney disease, stage 4 (severe): Secondary | ICD-10-CM | POA: Diagnosis not present

## 2014-08-22 DIAGNOSIS — D631 Anemia in chronic kidney disease: Secondary | ICD-10-CM | POA: Diagnosis not present

## 2014-08-24 DIAGNOSIS — N184 Chronic kidney disease, stage 4 (severe): Secondary | ICD-10-CM | POA: Diagnosis not present

## 2014-08-24 DIAGNOSIS — N186 End stage renal disease: Secondary | ICD-10-CM | POA: Diagnosis not present

## 2014-08-24 DIAGNOSIS — D631 Anemia in chronic kidney disease: Secondary | ICD-10-CM | POA: Diagnosis not present

## 2014-08-24 DIAGNOSIS — N2581 Secondary hyperparathyroidism of renal origin: Secondary | ICD-10-CM | POA: Diagnosis not present

## 2014-08-24 DIAGNOSIS — D509 Iron deficiency anemia, unspecified: Secondary | ICD-10-CM | POA: Diagnosis not present

## 2014-08-27 DIAGNOSIS — N186 End stage renal disease: Secondary | ICD-10-CM | POA: Diagnosis not present

## 2014-08-27 DIAGNOSIS — N2581 Secondary hyperparathyroidism of renal origin: Secondary | ICD-10-CM | POA: Diagnosis not present

## 2014-08-27 DIAGNOSIS — D631 Anemia in chronic kidney disease: Secondary | ICD-10-CM | POA: Diagnosis not present

## 2014-08-27 DIAGNOSIS — D509 Iron deficiency anemia, unspecified: Secondary | ICD-10-CM | POA: Diagnosis not present

## 2014-08-27 DIAGNOSIS — N184 Chronic kidney disease, stage 4 (severe): Secondary | ICD-10-CM | POA: Diagnosis not present

## 2014-08-29 ENCOUNTER — Encounter (HOSPITAL_COMMUNITY): Payer: Self-pay

## 2014-08-29 ENCOUNTER — Encounter: Payer: Self-pay | Admitting: Vascular Surgery

## 2014-08-29 DIAGNOSIS — N184 Chronic kidney disease, stage 4 (severe): Secondary | ICD-10-CM | POA: Diagnosis not present

## 2014-08-29 DIAGNOSIS — N2581 Secondary hyperparathyroidism of renal origin: Secondary | ICD-10-CM | POA: Diagnosis not present

## 2014-08-29 DIAGNOSIS — D631 Anemia in chronic kidney disease: Secondary | ICD-10-CM | POA: Diagnosis not present

## 2014-08-29 DIAGNOSIS — N186 End stage renal disease: Secondary | ICD-10-CM | POA: Diagnosis not present

## 2014-08-29 DIAGNOSIS — D509 Iron deficiency anemia, unspecified: Secondary | ICD-10-CM | POA: Diagnosis not present

## 2014-08-30 ENCOUNTER — Other Ambulatory Visit: Payer: Self-pay | Admitting: *Deleted

## 2014-08-30 DIAGNOSIS — Z4931 Encounter for adequacy testing for hemodialysis: Secondary | ICD-10-CM

## 2014-08-30 DIAGNOSIS — N186 End stage renal disease: Secondary | ICD-10-CM

## 2014-08-31 DIAGNOSIS — D631 Anemia in chronic kidney disease: Secondary | ICD-10-CM | POA: Diagnosis not present

## 2014-08-31 DIAGNOSIS — N2581 Secondary hyperparathyroidism of renal origin: Secondary | ICD-10-CM | POA: Diagnosis not present

## 2014-08-31 DIAGNOSIS — D509 Iron deficiency anemia, unspecified: Secondary | ICD-10-CM | POA: Diagnosis not present

## 2014-08-31 DIAGNOSIS — N184 Chronic kidney disease, stage 4 (severe): Secondary | ICD-10-CM | POA: Diagnosis not present

## 2014-08-31 DIAGNOSIS — N186 End stage renal disease: Secondary | ICD-10-CM | POA: Diagnosis not present

## 2014-09-02 ENCOUNTER — Ambulatory Visit (HOSPITAL_COMMUNITY)
Admission: RE | Admit: 2014-09-02 | Discharge: 2014-09-02 | Disposition: A | Discharge status: Home / Self Care | Payer: Medicare Other | Source: Ambulatory Visit | Attending: Surgery | Admitting: Surgery

## 2014-09-02 DIAGNOSIS — N186 End stage renal disease: Secondary | ICD-10-CM

## 2014-09-02 DIAGNOSIS — Z4931 Encounter for adequacy testing for hemodialysis: Secondary | ICD-10-CM | POA: Diagnosis not present

## 2014-09-03 DIAGNOSIS — N184 Chronic kidney disease, stage 4 (severe): Secondary | ICD-10-CM | POA: Diagnosis not present

## 2014-09-03 DIAGNOSIS — N2581 Secondary hyperparathyroidism of renal origin: Secondary | ICD-10-CM | POA: Diagnosis not present

## 2014-09-03 DIAGNOSIS — D509 Iron deficiency anemia, unspecified: Secondary | ICD-10-CM | POA: Diagnosis not present

## 2014-09-03 DIAGNOSIS — N186 End stage renal disease: Secondary | ICD-10-CM | POA: Diagnosis not present

## 2014-09-03 DIAGNOSIS — D631 Anemia in chronic kidney disease: Secondary | ICD-10-CM | POA: Diagnosis not present

## 2014-09-04 ENCOUNTER — Encounter: Payer: Self-pay | Admitting: Vascular Surgery

## 2014-09-04 DIAGNOSIS — N2889 Other specified disorders of kidney and ureter: Secondary | ICD-10-CM | POA: Diagnosis not present

## 2014-09-04 DIAGNOSIS — Z992 Dependence on renal dialysis: Secondary | ICD-10-CM | POA: Diagnosis not present

## 2014-09-04 DIAGNOSIS — N186 End stage renal disease: Secondary | ICD-10-CM | POA: Diagnosis not present

## 2014-09-05 ENCOUNTER — Ambulatory Visit (INDEPENDENT_AMBULATORY_CARE_PROVIDER_SITE_OTHER): Payer: Self-pay | Admitting: Vascular Surgery

## 2014-09-05 ENCOUNTER — Encounter: Payer: Self-pay | Admitting: Vascular Surgery

## 2014-09-05 VITALS — BP 103/65 | HR 73 | Temp 98.3°F | Ht 69.5 in | Wt 171.3 lb

## 2014-09-05 DIAGNOSIS — N2581 Secondary hyperparathyroidism of renal origin: Secondary | ICD-10-CM | POA: Diagnosis not present

## 2014-09-05 DIAGNOSIS — D509 Iron deficiency anemia, unspecified: Secondary | ICD-10-CM | POA: Diagnosis not present

## 2014-09-05 DIAGNOSIS — Z992 Dependence on renal dialysis: Secondary | ICD-10-CM

## 2014-09-05 DIAGNOSIS — N186 End stage renal disease: Secondary | ICD-10-CM

## 2014-09-05 DIAGNOSIS — D631 Anemia in chronic kidney disease: Secondary | ICD-10-CM | POA: Diagnosis not present

## 2014-09-05 DIAGNOSIS — N184 Chronic kidney disease, stage 4 (severe): Secondary | ICD-10-CM | POA: Diagnosis not present

## 2014-09-05 NOTE — Progress Notes (Signed)
Patient is an 81 year old male who returns for postoperative follow-up today. He had a right brachiocephalic AV fistula created on 07/15/2014. He is currently dialyzing via a right-sided catheter. He has previously had failed radiocephalic fistulas.  Physical exam:  Filed Vitals:   09/05/14 1411  BP: 103/65  Pulse: 73  Temp: 98.3 F (36.8 C)  TempSrc: Oral  Height: 5' 9.5" (1.765 m)  Weight: 171 lb 4.8 oz (77.701 kg)  SpO2: 99%    Right upper extremity: Healing antecubital incision palpable thrill audible bruit fistulas also palpable beneath the skin surface.  Assessment: Maturing fistula right upper arm without evidence of steal the graft   plan: Follow-up 6 weeks with a fistula duplex at that point.  Ruta Hinds, MD [facility] [place] Office: [phone] Pager: [phone]

## 2014-09-07 DIAGNOSIS — N2581 Secondary hyperparathyroidism of renal origin: Secondary | ICD-10-CM | POA: Diagnosis not present

## 2014-09-07 DIAGNOSIS — N186 End stage renal disease: Secondary | ICD-10-CM | POA: Diagnosis not present

## 2014-09-07 DIAGNOSIS — D509 Iron deficiency anemia, unspecified: Secondary | ICD-10-CM | POA: Diagnosis not present

## 2014-09-07 DIAGNOSIS — N184 Chronic kidney disease, stage 4 (severe): Secondary | ICD-10-CM | POA: Diagnosis not present

## 2014-09-07 DIAGNOSIS — D631 Anemia in chronic kidney disease: Secondary | ICD-10-CM | POA: Diagnosis not present

## 2014-09-10 DIAGNOSIS — N184 Chronic kidney disease, stage 4 (severe): Secondary | ICD-10-CM | POA: Diagnosis not present

## 2014-09-10 DIAGNOSIS — N186 End stage renal disease: Secondary | ICD-10-CM | POA: Diagnosis not present

## 2014-09-10 DIAGNOSIS — D631 Anemia in chronic kidney disease: Secondary | ICD-10-CM | POA: Diagnosis not present

## 2014-09-10 DIAGNOSIS — N2581 Secondary hyperparathyroidism of renal origin: Secondary | ICD-10-CM | POA: Diagnosis not present

## 2014-09-10 DIAGNOSIS — D509 Iron deficiency anemia, unspecified: Secondary | ICD-10-CM | POA: Diagnosis not present

## 2014-09-11 NOTE — Addendum Note (Signed)
Addended by: Dorthula Rue L on: 09/11/2014 09:52 AM   Modules accepted: Orders

## 2014-09-12 DIAGNOSIS — D631 Anemia in chronic kidney disease: Secondary | ICD-10-CM | POA: Diagnosis not present

## 2014-09-12 DIAGNOSIS — D509 Iron deficiency anemia, unspecified: Secondary | ICD-10-CM | POA: Diagnosis not present

## 2014-09-12 DIAGNOSIS — N2581 Secondary hyperparathyroidism of renal origin: Secondary | ICD-10-CM | POA: Diagnosis not present

## 2014-09-12 DIAGNOSIS — N186 End stage renal disease: Secondary | ICD-10-CM | POA: Diagnosis not present

## 2014-09-12 DIAGNOSIS — N184 Chronic kidney disease, stage 4 (severe): Secondary | ICD-10-CM | POA: Diagnosis not present

## 2014-09-14 DIAGNOSIS — N2581 Secondary hyperparathyroidism of renal origin: Secondary | ICD-10-CM | POA: Diagnosis not present

## 2014-09-14 DIAGNOSIS — N184 Chronic kidney disease, stage 4 (severe): Secondary | ICD-10-CM | POA: Diagnosis not present

## 2014-09-14 DIAGNOSIS — D631 Anemia in chronic kidney disease: Secondary | ICD-10-CM | POA: Diagnosis not present

## 2014-09-14 DIAGNOSIS — D509 Iron deficiency anemia, unspecified: Secondary | ICD-10-CM | POA: Diagnosis not present

## 2014-09-14 DIAGNOSIS — N186 End stage renal disease: Secondary | ICD-10-CM | POA: Diagnosis not present

## 2014-09-17 DIAGNOSIS — N2581 Secondary hyperparathyroidism of renal origin: Secondary | ICD-10-CM | POA: Diagnosis not present

## 2014-09-17 DIAGNOSIS — N184 Chronic kidney disease, stage 4 (severe): Secondary | ICD-10-CM | POA: Diagnosis not present

## 2014-09-17 DIAGNOSIS — D509 Iron deficiency anemia, unspecified: Secondary | ICD-10-CM | POA: Diagnosis not present

## 2014-09-17 DIAGNOSIS — D631 Anemia in chronic kidney disease: Secondary | ICD-10-CM | POA: Diagnosis not present

## 2014-09-17 DIAGNOSIS — N186 End stage renal disease: Secondary | ICD-10-CM | POA: Diagnosis not present

## 2014-09-19 DIAGNOSIS — D631 Anemia in chronic kidney disease: Secondary | ICD-10-CM | POA: Diagnosis not present

## 2014-09-19 DIAGNOSIS — N184 Chronic kidney disease, stage 4 (severe): Secondary | ICD-10-CM | POA: Diagnosis not present

## 2014-09-19 DIAGNOSIS — N2581 Secondary hyperparathyroidism of renal origin: Secondary | ICD-10-CM | POA: Diagnosis not present

## 2014-09-19 DIAGNOSIS — N186 End stage renal disease: Secondary | ICD-10-CM | POA: Diagnosis not present

## 2014-09-19 DIAGNOSIS — D509 Iron deficiency anemia, unspecified: Secondary | ICD-10-CM | POA: Diagnosis not present

## 2014-09-21 DIAGNOSIS — N184 Chronic kidney disease, stage 4 (severe): Secondary | ICD-10-CM | POA: Diagnosis not present

## 2014-09-21 DIAGNOSIS — D509 Iron deficiency anemia, unspecified: Secondary | ICD-10-CM | POA: Diagnosis not present

## 2014-09-21 DIAGNOSIS — N186 End stage renal disease: Secondary | ICD-10-CM | POA: Diagnosis not present

## 2014-09-21 DIAGNOSIS — D631 Anemia in chronic kidney disease: Secondary | ICD-10-CM | POA: Diagnosis not present

## 2014-09-21 DIAGNOSIS — N2581 Secondary hyperparathyroidism of renal origin: Secondary | ICD-10-CM | POA: Diagnosis not present

## 2014-09-24 DIAGNOSIS — N184 Chronic kidney disease, stage 4 (severe): Secondary | ICD-10-CM | POA: Diagnosis not present

## 2014-09-24 DIAGNOSIS — D509 Iron deficiency anemia, unspecified: Secondary | ICD-10-CM | POA: Diagnosis not present

## 2014-09-24 DIAGNOSIS — N186 End stage renal disease: Secondary | ICD-10-CM | POA: Diagnosis not present

## 2014-09-24 DIAGNOSIS — N2581 Secondary hyperparathyroidism of renal origin: Secondary | ICD-10-CM | POA: Diagnosis not present

## 2014-09-24 DIAGNOSIS — D631 Anemia in chronic kidney disease: Secondary | ICD-10-CM | POA: Diagnosis not present

## 2014-09-26 DIAGNOSIS — D631 Anemia in chronic kidney disease: Secondary | ICD-10-CM | POA: Diagnosis not present

## 2014-09-26 DIAGNOSIS — N184 Chronic kidney disease, stage 4 (severe): Secondary | ICD-10-CM | POA: Diagnosis not present

## 2014-09-26 DIAGNOSIS — N2581 Secondary hyperparathyroidism of renal origin: Secondary | ICD-10-CM | POA: Diagnosis not present

## 2014-09-26 DIAGNOSIS — D509 Iron deficiency anemia, unspecified: Secondary | ICD-10-CM | POA: Diagnosis not present

## 2014-09-26 DIAGNOSIS — N186 End stage renal disease: Secondary | ICD-10-CM | POA: Diagnosis not present

## 2014-09-28 DIAGNOSIS — D509 Iron deficiency anemia, unspecified: Secondary | ICD-10-CM | POA: Diagnosis not present

## 2014-09-28 DIAGNOSIS — N184 Chronic kidney disease, stage 4 (severe): Secondary | ICD-10-CM | POA: Diagnosis not present

## 2014-09-28 DIAGNOSIS — N2581 Secondary hyperparathyroidism of renal origin: Secondary | ICD-10-CM | POA: Diagnosis not present

## 2014-09-28 DIAGNOSIS — N186 End stage renal disease: Secondary | ICD-10-CM | POA: Diagnosis not present

## 2014-09-28 DIAGNOSIS — D631 Anemia in chronic kidney disease: Secondary | ICD-10-CM | POA: Diagnosis not present

## 2014-10-01 DIAGNOSIS — D509 Iron deficiency anemia, unspecified: Secondary | ICD-10-CM | POA: Diagnosis not present

## 2014-10-01 DIAGNOSIS — N186 End stage renal disease: Secondary | ICD-10-CM | POA: Diagnosis not present

## 2014-10-01 DIAGNOSIS — D631 Anemia in chronic kidney disease: Secondary | ICD-10-CM | POA: Diagnosis not present

## 2014-10-01 DIAGNOSIS — N2581 Secondary hyperparathyroidism of renal origin: Secondary | ICD-10-CM | POA: Diagnosis not present

## 2014-10-01 DIAGNOSIS — N184 Chronic kidney disease, stage 4 (severe): Secondary | ICD-10-CM | POA: Diagnosis not present

## 2014-10-03 DIAGNOSIS — N2581 Secondary hyperparathyroidism of renal origin: Secondary | ICD-10-CM | POA: Diagnosis not present

## 2014-10-03 DIAGNOSIS — D631 Anemia in chronic kidney disease: Secondary | ICD-10-CM | POA: Diagnosis not present

## 2014-10-03 DIAGNOSIS — D509 Iron deficiency anemia, unspecified: Secondary | ICD-10-CM | POA: Diagnosis not present

## 2014-10-03 DIAGNOSIS — N184 Chronic kidney disease, stage 4 (severe): Secondary | ICD-10-CM | POA: Diagnosis not present

## 2014-10-03 DIAGNOSIS — N186 End stage renal disease: Secondary | ICD-10-CM | POA: Diagnosis not present

## 2014-10-04 DIAGNOSIS — Z992 Dependence on renal dialysis: Secondary | ICD-10-CM | POA: Diagnosis not present

## 2014-10-04 DIAGNOSIS — N2889 Other specified disorders of kidney and ureter: Secondary | ICD-10-CM | POA: Diagnosis not present

## 2014-10-04 DIAGNOSIS — N186 End stage renal disease: Secondary | ICD-10-CM | POA: Diagnosis not present

## 2014-10-05 DIAGNOSIS — N186 End stage renal disease: Secondary | ICD-10-CM | POA: Diagnosis not present

## 2014-10-05 DIAGNOSIS — N184 Chronic kidney disease, stage 4 (severe): Secondary | ICD-10-CM | POA: Diagnosis not present

## 2014-10-05 DIAGNOSIS — D631 Anemia in chronic kidney disease: Secondary | ICD-10-CM | POA: Diagnosis not present

## 2014-10-05 DIAGNOSIS — N2581 Secondary hyperparathyroidism of renal origin: Secondary | ICD-10-CM | POA: Diagnosis not present

## 2014-10-05 DIAGNOSIS — D509 Iron deficiency anemia, unspecified: Secondary | ICD-10-CM | POA: Diagnosis not present

## 2014-10-05 DIAGNOSIS — Z23 Encounter for immunization: Secondary | ICD-10-CM | POA: Diagnosis not present

## 2014-10-08 DIAGNOSIS — Z23 Encounter for immunization: Secondary | ICD-10-CM | POA: Diagnosis not present

## 2014-10-08 DIAGNOSIS — D509 Iron deficiency anemia, unspecified: Secondary | ICD-10-CM | POA: Diagnosis not present

## 2014-10-08 DIAGNOSIS — N2581 Secondary hyperparathyroidism of renal origin: Secondary | ICD-10-CM | POA: Diagnosis not present

## 2014-10-08 DIAGNOSIS — D631 Anemia in chronic kidney disease: Secondary | ICD-10-CM | POA: Diagnosis not present

## 2014-10-08 DIAGNOSIS — N184 Chronic kidney disease, stage 4 (severe): Secondary | ICD-10-CM | POA: Diagnosis not present

## 2014-10-08 DIAGNOSIS — N186 End stage renal disease: Secondary | ICD-10-CM | POA: Diagnosis not present

## 2014-10-10 DIAGNOSIS — N186 End stage renal disease: Secondary | ICD-10-CM | POA: Diagnosis not present

## 2014-10-10 DIAGNOSIS — D631 Anemia in chronic kidney disease: Secondary | ICD-10-CM | POA: Diagnosis not present

## 2014-10-10 DIAGNOSIS — N2581 Secondary hyperparathyroidism of renal origin: Secondary | ICD-10-CM | POA: Diagnosis not present

## 2014-10-10 DIAGNOSIS — Z23 Encounter for immunization: Secondary | ICD-10-CM | POA: Diagnosis not present

## 2014-10-10 DIAGNOSIS — N184 Chronic kidney disease, stage 4 (severe): Secondary | ICD-10-CM | POA: Diagnosis not present

## 2014-10-10 DIAGNOSIS — D509 Iron deficiency anemia, unspecified: Secondary | ICD-10-CM | POA: Diagnosis not present

## 2014-10-12 DIAGNOSIS — N186 End stage renal disease: Secondary | ICD-10-CM | POA: Diagnosis not present

## 2014-10-12 DIAGNOSIS — N184 Chronic kidney disease, stage 4 (severe): Secondary | ICD-10-CM | POA: Diagnosis not present

## 2014-10-12 DIAGNOSIS — N2581 Secondary hyperparathyroidism of renal origin: Secondary | ICD-10-CM | POA: Diagnosis not present

## 2014-10-12 DIAGNOSIS — D631 Anemia in chronic kidney disease: Secondary | ICD-10-CM | POA: Diagnosis not present

## 2014-10-12 DIAGNOSIS — Z23 Encounter for immunization: Secondary | ICD-10-CM | POA: Diagnosis not present

## 2014-10-12 DIAGNOSIS — D509 Iron deficiency anemia, unspecified: Secondary | ICD-10-CM | POA: Diagnosis not present

## 2014-10-15 ENCOUNTER — Encounter: Payer: Self-pay | Admitting: Vascular Surgery

## 2014-10-15 DIAGNOSIS — N2581 Secondary hyperparathyroidism of renal origin: Secondary | ICD-10-CM | POA: Diagnosis not present

## 2014-10-15 DIAGNOSIS — D631 Anemia in chronic kidney disease: Secondary | ICD-10-CM | POA: Diagnosis not present

## 2014-10-15 DIAGNOSIS — N184 Chronic kidney disease, stage 4 (severe): Secondary | ICD-10-CM | POA: Diagnosis not present

## 2014-10-15 DIAGNOSIS — D509 Iron deficiency anemia, unspecified: Secondary | ICD-10-CM | POA: Diagnosis not present

## 2014-10-15 DIAGNOSIS — Z23 Encounter for immunization: Secondary | ICD-10-CM | POA: Diagnosis not present

## 2014-10-15 DIAGNOSIS — N186 End stage renal disease: Secondary | ICD-10-CM | POA: Diagnosis not present

## 2014-10-17 ENCOUNTER — Encounter: Payer: Self-pay | Admitting: Vascular Surgery

## 2014-10-17 ENCOUNTER — Ambulatory Visit (INDEPENDENT_AMBULATORY_CARE_PROVIDER_SITE_OTHER): Payer: Medicare Other | Admitting: Vascular Surgery

## 2014-10-17 ENCOUNTER — Ambulatory Visit (HOSPITAL_COMMUNITY)
Admission: RE | Admit: 2014-10-17 | Discharge: 2014-10-17 | Disposition: A | Discharge status: Home / Self Care | Payer: Medicare Other | Source: Ambulatory Visit | Attending: Vascular Surgery | Admitting: Vascular Surgery

## 2014-10-17 VITALS — BP 108/67 | HR 70 | Ht 69.5 in | Wt 172.0 lb

## 2014-10-17 DIAGNOSIS — N2581 Secondary hyperparathyroidism of renal origin: Secondary | ICD-10-CM | POA: Diagnosis not present

## 2014-10-17 DIAGNOSIS — N186 End stage renal disease: Secondary | ICD-10-CM

## 2014-10-17 DIAGNOSIS — D509 Iron deficiency anemia, unspecified: Secondary | ICD-10-CM | POA: Diagnosis not present

## 2014-10-17 DIAGNOSIS — Z992 Dependence on renal dialysis: Secondary | ICD-10-CM | POA: Insufficient documentation

## 2014-10-17 DIAGNOSIS — D631 Anemia in chronic kidney disease: Secondary | ICD-10-CM | POA: Diagnosis not present

## 2014-10-17 DIAGNOSIS — Z23 Encounter for immunization: Secondary | ICD-10-CM | POA: Diagnosis not present

## 2014-10-17 DIAGNOSIS — N184 Chronic kidney disease, stage 4 (severe): Secondary | ICD-10-CM | POA: Diagnosis not present

## 2014-10-17 NOTE — Progress Notes (Signed)
Patient returns for follow-up today after placement of a right brachiocephalic AV fistula proximally 3 months ago. He is currently using a right-sided catheter. Denies any numbness or tingling or steel type symptoms in his hand.  Physical exam:  Filed Vitals:   10/17/14 1554  BP: 108/67  Pulse: 70  Height: 5' 9.5" (1.765 m)  Weight: 172 lb (78.019 kg)  SpO2: 99%    Right upper extremity: Easily palpable thrill in the fistula fistula is palpable throughout two thirds of its course.   Data: The fistula was greater than 6 mm throughout its course and less than 6 mm in depth throughout most of its course.  Assessment: Maturing right arm AV fistula ready for cannulation  Plan: Follow-up as needed if difficulty with his fistula  Ruta Hinds, MD [facility] [place]: [phone] Pager: [phone]

## 2014-10-19 DIAGNOSIS — N2581 Secondary hyperparathyroidism of renal origin: Secondary | ICD-10-CM | POA: Diagnosis not present

## 2014-10-19 DIAGNOSIS — N184 Chronic kidney disease, stage 4 (severe): Secondary | ICD-10-CM | POA: Diagnosis not present

## 2014-10-19 DIAGNOSIS — D631 Anemia in chronic kidney disease: Secondary | ICD-10-CM | POA: Diagnosis not present

## 2014-10-19 DIAGNOSIS — Z23 Encounter for immunization: Secondary | ICD-10-CM | POA: Diagnosis not present

## 2014-10-19 DIAGNOSIS — D509 Iron deficiency anemia, unspecified: Secondary | ICD-10-CM | POA: Diagnosis not present

## 2014-10-19 DIAGNOSIS — N186 End stage renal disease: Secondary | ICD-10-CM | POA: Diagnosis not present

## 2014-10-22 DIAGNOSIS — D631 Anemia in chronic kidney disease: Secondary | ICD-10-CM | POA: Diagnosis not present

## 2014-10-22 DIAGNOSIS — D509 Iron deficiency anemia, unspecified: Secondary | ICD-10-CM | POA: Diagnosis not present

## 2014-10-22 DIAGNOSIS — N186 End stage renal disease: Secondary | ICD-10-CM | POA: Diagnosis not present

## 2014-10-22 DIAGNOSIS — N2581 Secondary hyperparathyroidism of renal origin: Secondary | ICD-10-CM | POA: Diagnosis not present

## 2014-10-22 DIAGNOSIS — N184 Chronic kidney disease, stage 4 (severe): Secondary | ICD-10-CM | POA: Diagnosis not present

## 2014-10-22 DIAGNOSIS — Z23 Encounter for immunization: Secondary | ICD-10-CM | POA: Diagnosis not present

## 2014-10-24 DIAGNOSIS — N2581 Secondary hyperparathyroidism of renal origin: Secondary | ICD-10-CM | POA: Diagnosis not present

## 2014-10-24 DIAGNOSIS — D509 Iron deficiency anemia, unspecified: Secondary | ICD-10-CM | POA: Diagnosis not present

## 2014-10-24 DIAGNOSIS — D631 Anemia in chronic kidney disease: Secondary | ICD-10-CM | POA: Diagnosis not present

## 2014-10-24 DIAGNOSIS — Z23 Encounter for immunization: Secondary | ICD-10-CM | POA: Diagnosis not present

## 2014-10-24 DIAGNOSIS — N186 End stage renal disease: Secondary | ICD-10-CM | POA: Diagnosis not present

## 2014-10-24 DIAGNOSIS — N184 Chronic kidney disease, stage 4 (severe): Secondary | ICD-10-CM | POA: Diagnosis not present

## 2014-10-26 DIAGNOSIS — N186 End stage renal disease: Secondary | ICD-10-CM | POA: Diagnosis not present

## 2014-10-26 DIAGNOSIS — Z23 Encounter for immunization: Secondary | ICD-10-CM | POA: Diagnosis not present

## 2014-10-26 DIAGNOSIS — N2581 Secondary hyperparathyroidism of renal origin: Secondary | ICD-10-CM | POA: Diagnosis not present

## 2014-10-26 DIAGNOSIS — N184 Chronic kidney disease, stage 4 (severe): Secondary | ICD-10-CM | POA: Diagnosis not present

## 2014-10-26 DIAGNOSIS — D509 Iron deficiency anemia, unspecified: Secondary | ICD-10-CM | POA: Diagnosis not present

## 2014-10-26 DIAGNOSIS — D631 Anemia in chronic kidney disease: Secondary | ICD-10-CM | POA: Diagnosis not present

## 2014-10-29 ENCOUNTER — Encounter: Payer: Self-pay | Admitting: Internal Medicine

## 2014-10-29 DIAGNOSIS — N186 End stage renal disease: Secondary | ICD-10-CM | POA: Diagnosis not present

## 2014-10-29 DIAGNOSIS — D631 Anemia in chronic kidney disease: Secondary | ICD-10-CM | POA: Diagnosis not present

## 2014-10-29 DIAGNOSIS — D509 Iron deficiency anemia, unspecified: Secondary | ICD-10-CM | POA: Diagnosis not present

## 2014-10-29 DIAGNOSIS — Z23 Encounter for immunization: Secondary | ICD-10-CM | POA: Diagnosis not present

## 2014-10-29 DIAGNOSIS — N184 Chronic kidney disease, stage 4 (severe): Secondary | ICD-10-CM | POA: Diagnosis not present

## 2014-10-29 DIAGNOSIS — N2581 Secondary hyperparathyroidism of renal origin: Secondary | ICD-10-CM | POA: Diagnosis not present

## 2014-10-30 ENCOUNTER — Non-Acute Institutional Stay: Payer: Medicare Other | Admitting: Internal Medicine

## 2014-10-30 ENCOUNTER — Encounter: Payer: Self-pay | Admitting: Internal Medicine

## 2014-10-30 VITALS — BP 132/68 | HR 64 | Temp 97.4°F | Wt 174.0 lb

## 2014-10-30 DIAGNOSIS — K219 Gastro-esophageal reflux disease without esophagitis: Secondary | ICD-10-CM | POA: Diagnosis not present

## 2014-10-30 DIAGNOSIS — E785 Hyperlipidemia, unspecified: Secondary | ICD-10-CM | POA: Diagnosis not present

## 2014-10-30 DIAGNOSIS — M1A3790 Chronic gout due to renal impairment, unspecified ankle and foot, without tophus (tophi): Secondary | ICD-10-CM

## 2014-10-30 DIAGNOSIS — N186 End stage renal disease: Secondary | ICD-10-CM | POA: Diagnosis not present

## 2014-10-30 DIAGNOSIS — Z992 Dependence on renal dialysis: Secondary | ICD-10-CM | POA: Diagnosis not present

## 2014-10-30 DIAGNOSIS — G4700 Insomnia, unspecified: Secondary | ICD-10-CM | POA: Diagnosis not present

## 2014-10-30 DIAGNOSIS — I10 Essential (primary) hypertension: Secondary | ICD-10-CM | POA: Diagnosis not present

## 2014-10-30 MED ORDER — SUVOREXANT 10 MG PO TABS: 10.0000 mg | ORAL_TABLET | Freq: Every evening | ORAL | Status: DC | PRN

## 2014-10-30 NOTE — Progress Notes (Signed)
Location:  [facility]  Code Status: DNR Goals of Care: Advanced Directive information Does patient have an advance directive?: Yes, Type of Advance Directive: Healthcare Power of Attorney   Chief Complaint  Patient presents with  . Medical Management of Chronic Issues    ESRD on dialysis, blood pressure, insomina, GERD. Here with wife    HPI: Patient is a 81 y.o. male seen in the office today for follow up.   He noticed he had some weight loss of 6 pounds- down to 167 at the end of last month. He checked this on multiple scales. Weight dropped steadily over one week, then returned to normal the next week. No associated GI symptoms, or change in eating habits.   He is having trouble sleeping. Especially the nights before his dialysis. Has trouble falling and staying asleep. There are nights his Fitbit indicates he is only getting 1.5 hours of sleep. He notes fatigue the following day after those nights. Denies anxiety and depression. He attends dialysis 3 days per week.   He is on dialysis 3 days per week. Has had some frustration with problems with his fistula and grafts. Otherwise he is tolerating dialysis well except that it is boring.  BP is controlled.   No GERD symptoms. Denies any recent gout flares.   Review of Systems:  Review of Systems  Constitutional: Positive for weight loss (see HPI for details) and malaise/fatigue. Negative for fever and chills.  Respiratory: Negative for cough and wheezing.   Cardiovascular: Negative for chest pain, palpitations and leg swelling.  Gastrointestinal: Negative for heartburn, nausea, vomiting, diarrhea and constipation.  Genitourinary:       Dialysis 3 times per week  Musculoskeletal: Negative for myalgias and joint pain.  Neurological: Negative for dizziness.  Psychiatric/Behavioral: Negative for depression. The patient has insomnia (especially before dialysis). The patient is not nervous/anxious.     Past Medical History   Diagnosis Date  . CKD (chronic kidney disease) stage 5, GFR less than 15 ml/min (HCC)   . Unspecified essential hypertension   . GERD (gastroesophageal reflux disease)   . Gout   . Herpes zoster 04/2012  . Heart murmur     as a teen  . Shortness of breath dyspnea   . Anxiety   . Arthritis   . Complication of anesthesia     " one time my heart stopped due to a medication that I was on."     Past Surgical History  Procedure Laterality Date  . Appendectomy  1944  . Cataract extraction w/ intraocular lens implant Bilateral 2014    [place]  . Dialysis fistula creation  08/04/2012 and 10/13    Dr. Harden Mo  . Colonoscopy  2013    Dr. Annamaria Helling [place], [place].  Marland Kitchen Av fistula placement Left     Left Cimino AVF placed in [place]   . Insertion of dialysis catheter Right 01-15-14    Right chest TDC placed by Dr. Augustin Coupe at [facility]  . Eye surgery      Cataract  . Av fistula placement Right 03/06/2014    Procedure: ARTERIOVENOUS (AV) FISTULA CREATION-right radiocephalic;  Surgeon: Mal Misty, MD;  Location: [place];  Service: Vascular;  Laterality: Right;  . Ligation of competing branches of arteriovenous fistula Right 05/08/2014    Procedure: LIGATION OF COMPETING BRANCHES OF RIGHT ARM RADIOCEPHALIC ARTERIOVENOUS FISTULA;  Surgeon: Mal Misty, MD;  Location: [place];  Service: Vascular;  Laterality:  Right;  . Av fistula placement Right 07/15/2014    Procedure: ARTERIOVENOUS (AV) FISTULA CREATION;  Surgeon: Elam Dutch, MD;  Location: [facility] OR;  Service: Vascular;  Laterality: Right;  . Ligation of arteriovenous  fistula Right 07/15/2014    Procedure: LIGATION OF ARTERIOVENOUS  FISTULA  (RIGHT RADIOCEPHALIC);  Surgeon: Elam Dutch, MD;  Location: [place];  Service: Vascular;  Laterality: Right;  . Revison of arteriovenous fistula Right 07/15/2014    Procedure: EXPLORATION OF ARTERIOVENOUS FISTULA;  Surgeon: Elam Dutch, MD;  Location[facility] OR;  Service: Vascular;   Laterality: Right;    No Known Allergies Medications: Patient's Medications  New Prescriptions   SUVOREXANT (BELSOMRA) 10 MG TABS    Take 10 mg by mouth at bedtime as needed.  Previous Medications   ALLOPURINOL (ZYLOPRIM) 100 MG TABLET    Take 1 tablet (100 mg total) by mouth daily.   AMLODIPINE (NORVASC) 10 MG TABLET    Take one tablet by mouth once daily to control blood pressure   ASPIRIN EC 81 MG TABLET    Take 81 mg by mouth at bedtime.    ATORVASTATIN (LIPITOR) 20 MG TABLET    Take 1 tablet (20 mg total) by mouth daily.   MELATONIN 10 MG TABS    Take 10 mg by mouth at bedtime. Take the night before dialysis   MULTIVITAMIN (RENA-VIT) TABS TABLET    Take 1 tablet by mouth daily.    OMEPRAZOLE (PRILOSEC) 20 MG CAPSULE    Take 20 mg by mouth 4 (four) times a week. Sunday, Tuesday, Thursday and Saturday   VITAMIN D, ERGOCALCIFEROL, (DRISDOL) 50000 UNITS CAPS CAPSULE    Take 50,000 Units by mouth every 14 (fourteen) days. 1st and 15th of each month  Modified Medications   No medications on file  Discontinued Medications   No medications on file    Physical Exam: Filed Vitals:   10/30/14 1538  BP: 132/68  Pulse: 64  Temp: 97.4 F (36.3 C)  TempSrc: Oral  Weight: 174 lb (78.926 kg)  SpO2: 98%   Physical Exam  Constitutional: He is oriented to person, place, and time.  Cardiovascular: Normal rate, regular rhythm and intact distal pulses.   Murmur heard. bruit and thrill positive from dialysis graft in R upper arm,   Pulmonary/Chest: Effort normal and breath sounds normal.  Abdominal: Soft. Bowel sounds are normal. He exhibits no distension. There is no tenderness.  Musculoskeletal: Normal range of motion. He exhibits no tenderness.  Neurological: He is alert and oriented to person, place, and time.  Skin: Skin is warm and dry.  Psychiatric: He has a normal mood and affect. His behavior is normal. Judgment and thought content normal.    Labs reviewed: Basic Metabolic  Panel: BMP Latest Ref Rng 07/16/2014 07/15/2014 05/08/2014  Glucose 65 - 99 mg/dL 109(H) 110(H) 101(H)  BUN 6 - 20 mg/dL 56(H) - -  Creatinine 0.61 - 1.24 mg/dL 8.01(H) - -  Sodium 135 - 145 mmol/L 138 140 138  Potassium 3.5 - 5.1 mmol/L 4.4 5.1 4.7  Chloride 101 - 111 mmol/L 102 - -  CO2 22 - 32 mmol/L 22 - -  Calcium 8.9 - 10.3 mg/dL 9.3 - -    CBC:  Recent Labs  01/24/14 02/28/14  05/08/14 0930 07/15/14 0944 07/16/14 0710  WBC 5.8 4.5  --   --   --  7.6  HGB 10.3* 11.1*  < > 12.9* 12.9* 10.0*  HCT 31* 34*  < >  38.0* 38.0* 30.3*  MCV  --   --   --   --   --  98.1  PLT 225  --   --   --   --  227  < > = values in this interval not displayed.   Assessment/Plan 1. Insomnia New onset. Related temporally to nights before dialysis. No evidence that it is anxiety related. Will try Belsomra. Instructed him to monitor for side effects and notify provider or stop medication if not tolerated.  - Suvorexant (BELSOMRA) 10 MG TABS; Take 10 mg by mouth at bedtime as needed.  Dispense: 30 tablet; Refill: 5  2. ESRD on dialysis [facility]) Dialysis 3 times per week. Recent fluctuation in weight may have been result of changing fluid volume states with dialysis. Instructed him to continue to monitor weight. Dialysis is also monitoring his weight and fluid status.   3. Gastroesophageal reflux disease without esophagitis Chronic. Controlled with omeprazole 20mg  daily.   4. Chronic gout due to renal impairment involving foot without tophus, unspecified laterality Controlled. No recent flares. Continue allopurinol 100mg  daily. Will check uric acid level with next CPE.   5. Hyperlipidemia Controlled. Currently on statin therapy- Lipitor 20mg  daily. No arthralgias or myalgias.Continue Lipitor. Recheck lipid panel at next visit.   6. Essential hypertension Controlled. No hypotensive signs or symptoms. Continue amlodipine 10mg  daily.   Next appt: 6 months with Dr. Mariea Clonts for CPE with labs before.    Mariana Kaufman, RN, BSN Student- Nurse Practitioner- [facility] [address], [place] [address] Office Phone:  [phone] Office Fax:  [phone]

## 2014-10-31 ENCOUNTER — Telehealth: Payer: Self-pay

## 2014-10-31 DIAGNOSIS — D509 Iron deficiency anemia, unspecified: Secondary | ICD-10-CM | POA: Diagnosis not present

## 2014-10-31 DIAGNOSIS — D631 Anemia in chronic kidney disease: Secondary | ICD-10-CM | POA: Diagnosis not present

## 2014-10-31 DIAGNOSIS — Z23 Encounter for immunization: Secondary | ICD-10-CM | POA: Diagnosis not present

## 2014-10-31 DIAGNOSIS — N186 End stage renal disease: Secondary | ICD-10-CM | POA: Diagnosis not present

## 2014-10-31 DIAGNOSIS — N2581 Secondary hyperparathyroidism of renal origin: Secondary | ICD-10-CM | POA: Diagnosis not present

## 2014-10-31 DIAGNOSIS — N184 Chronic kidney disease, stage 4 (severe): Secondary | ICD-10-CM | POA: Diagnosis not present

## 2014-10-31 NOTE — Telephone Encounter (Signed)
Left message for patient that I have some Belsomra samples for him to pick up at the office.  Take Belsomra 10mg  one at bedtime, try for 3 nights, if this doesn't work can take two 10mg  tablets at bedtime.

## 2014-11-02 DIAGNOSIS — N186 End stage renal disease: Secondary | ICD-10-CM | POA: Diagnosis not present

## 2014-11-02 DIAGNOSIS — D509 Iron deficiency anemia, unspecified: Secondary | ICD-10-CM | POA: Diagnosis not present

## 2014-11-02 DIAGNOSIS — N184 Chronic kidney disease, stage 4 (severe): Secondary | ICD-10-CM | POA: Diagnosis not present

## 2014-11-02 DIAGNOSIS — Z23 Encounter for immunization: Secondary | ICD-10-CM | POA: Diagnosis not present

## 2014-11-02 DIAGNOSIS — N2581 Secondary hyperparathyroidism of renal origin: Secondary | ICD-10-CM | POA: Diagnosis not present

## 2014-11-02 DIAGNOSIS — D631 Anemia in chronic kidney disease: Secondary | ICD-10-CM | POA: Diagnosis not present

## 2014-11-04 DIAGNOSIS — Z992 Dependence on renal dialysis: Secondary | ICD-10-CM | POA: Diagnosis not present

## 2014-11-04 DIAGNOSIS — N186 End stage renal disease: Secondary | ICD-10-CM | POA: Diagnosis not present

## 2014-11-04 DIAGNOSIS — N2889 Other specified disorders of kidney and ureter: Secondary | ICD-10-CM | POA: Diagnosis not present

## 2014-11-05 DIAGNOSIS — N186 End stage renal disease: Secondary | ICD-10-CM | POA: Diagnosis not present

## 2014-11-05 DIAGNOSIS — D509 Iron deficiency anemia, unspecified: Secondary | ICD-10-CM | POA: Diagnosis not present

## 2014-11-05 DIAGNOSIS — N2581 Secondary hyperparathyroidism of renal origin: Secondary | ICD-10-CM | POA: Diagnosis not present

## 2014-11-05 DIAGNOSIS — D631 Anemia in chronic kidney disease: Secondary | ICD-10-CM | POA: Diagnosis not present

## 2014-11-05 DIAGNOSIS — N184 Chronic kidney disease, stage 4 (severe): Secondary | ICD-10-CM | POA: Diagnosis not present

## 2014-11-07 DIAGNOSIS — N186 End stage renal disease: Secondary | ICD-10-CM | POA: Diagnosis not present

## 2014-11-07 DIAGNOSIS — D631 Anemia in chronic kidney disease: Secondary | ICD-10-CM | POA: Diagnosis not present

## 2014-11-07 DIAGNOSIS — D509 Iron deficiency anemia, unspecified: Secondary | ICD-10-CM | POA: Diagnosis not present

## 2014-11-07 DIAGNOSIS — N184 Chronic kidney disease, stage 4 (severe): Secondary | ICD-10-CM | POA: Diagnosis not present

## 2014-11-07 DIAGNOSIS — N2581 Secondary hyperparathyroidism of renal origin: Secondary | ICD-10-CM | POA: Diagnosis not present

## 2014-11-09 DIAGNOSIS — N186 End stage renal disease: Secondary | ICD-10-CM | POA: Diagnosis not present

## 2014-11-09 DIAGNOSIS — D631 Anemia in chronic kidney disease: Secondary | ICD-10-CM | POA: Diagnosis not present

## 2014-11-09 DIAGNOSIS — D509 Iron deficiency anemia, unspecified: Secondary | ICD-10-CM | POA: Diagnosis not present

## 2014-11-09 DIAGNOSIS — N2581 Secondary hyperparathyroidism of renal origin: Secondary | ICD-10-CM | POA: Diagnosis not present

## 2014-11-09 DIAGNOSIS — N184 Chronic kidney disease, stage 4 (severe): Secondary | ICD-10-CM | POA: Diagnosis not present

## 2014-11-11 ENCOUNTER — Other Ambulatory Visit: Payer: Self-pay | Admitting: *Deleted

## 2014-11-11 MED ORDER — ATORVASTATIN CALCIUM 20 MG PO TABS: ORAL_TABLET | ORAL | Status: DC

## 2014-11-11 NOTE — Telephone Encounter (Signed)
[place]

## 2014-11-12 DIAGNOSIS — N186 End stage renal disease: Secondary | ICD-10-CM | POA: Diagnosis not present

## 2014-11-12 DIAGNOSIS — D631 Anemia in chronic kidney disease: Secondary | ICD-10-CM | POA: Diagnosis not present

## 2014-11-12 DIAGNOSIS — D509 Iron deficiency anemia, unspecified: Secondary | ICD-10-CM | POA: Diagnosis not present

## 2014-11-12 DIAGNOSIS — N2581 Secondary hyperparathyroidism of renal origin: Secondary | ICD-10-CM | POA: Diagnosis not present

## 2014-11-12 DIAGNOSIS — N184 Chronic kidney disease, stage 4 (severe): Secondary | ICD-10-CM | POA: Diagnosis not present

## 2014-11-13 DIAGNOSIS — Z452 Encounter for adjustment and management of vascular access device: Secondary | ICD-10-CM | POA: Diagnosis not present

## 2014-11-14 DIAGNOSIS — N184 Chronic kidney disease, stage 4 (severe): Secondary | ICD-10-CM | POA: Diagnosis not present

## 2014-11-14 DIAGNOSIS — D631 Anemia in chronic kidney disease: Secondary | ICD-10-CM | POA: Diagnosis not present

## 2014-11-14 DIAGNOSIS — N2581 Secondary hyperparathyroidism of renal origin: Secondary | ICD-10-CM | POA: Diagnosis not present

## 2014-11-14 DIAGNOSIS — N186 End stage renal disease: Secondary | ICD-10-CM | POA: Diagnosis not present

## 2014-11-14 DIAGNOSIS — D509 Iron deficiency anemia, unspecified: Secondary | ICD-10-CM | POA: Diagnosis not present

## 2014-11-16 DIAGNOSIS — D631 Anemia in chronic kidney disease: Secondary | ICD-10-CM | POA: Diagnosis not present

## 2014-11-16 DIAGNOSIS — N186 End stage renal disease: Secondary | ICD-10-CM | POA: Diagnosis not present

## 2014-11-16 DIAGNOSIS — D509 Iron deficiency anemia, unspecified: Secondary | ICD-10-CM | POA: Diagnosis not present

## 2014-11-16 DIAGNOSIS — N184 Chronic kidney disease, stage 4 (severe): Secondary | ICD-10-CM | POA: Diagnosis not present

## 2014-11-16 DIAGNOSIS — N2581 Secondary hyperparathyroidism of renal origin: Secondary | ICD-10-CM | POA: Diagnosis not present

## 2014-11-19 DIAGNOSIS — D631 Anemia in chronic kidney disease: Secondary | ICD-10-CM | POA: Diagnosis not present

## 2014-11-19 DIAGNOSIS — D509 Iron deficiency anemia, unspecified: Secondary | ICD-10-CM | POA: Diagnosis not present

## 2014-11-19 DIAGNOSIS — N184 Chronic kidney disease, stage 4 (severe): Secondary | ICD-10-CM | POA: Diagnosis not present

## 2014-11-19 DIAGNOSIS — N2581 Secondary hyperparathyroidism of renal origin: Secondary | ICD-10-CM | POA: Diagnosis not present

## 2014-11-19 DIAGNOSIS — N186 End stage renal disease: Secondary | ICD-10-CM | POA: Diagnosis not present

## 2014-11-20 ENCOUNTER — Encounter: Payer: Self-pay | Admitting: Internal Medicine

## 2014-11-20 ENCOUNTER — Non-Acute Institutional Stay: Payer: Medicare Other | Admitting: Internal Medicine

## 2014-11-20 VITALS — BP 110/62 | HR 64 | Temp 98.0°F | Wt 171.0 lb

## 2014-11-20 DIAGNOSIS — R0600 Dyspnea, unspecified: Secondary | ICD-10-CM

## 2014-11-20 DIAGNOSIS — Z992 Dependence on renal dialysis: Secondary | ICD-10-CM | POA: Diagnosis not present

## 2014-11-20 DIAGNOSIS — R5383 Other fatigue: Secondary | ICD-10-CM | POA: Diagnosis not present

## 2014-11-20 DIAGNOSIS — R0609 Other forms of dyspnea: Secondary | ICD-10-CM

## 2014-11-20 DIAGNOSIS — I259 Chronic ischemic heart disease, unspecified: Secondary | ICD-10-CM | POA: Diagnosis not present

## 2014-11-20 DIAGNOSIS — N186 End stage renal disease: Secondary | ICD-10-CM

## 2014-11-20 NOTE — Progress Notes (Signed)
Patient ID: Eric [name], male   DOB: 12-Mar-1930, 81 y.o.   MRN: [id number]   Location:  [facility]  Code Status: DNR  Goals of Care:Advanced Directive information Does patient have an advance directive?: Yes, Type of Advance Directive: Healthcare Power of Attorney  Chief Complaint  Patient presents with  . Acute Visit    exhausted, out of breath with walking since they pull cath out 11/13/14    HPI: Patient is a 81 y.o. white male with ESRD on HD via AV fistula, insomnia, GERD seen in the [facility] today for an acute visit due to increased dyspnea on exertion since last Thursday.    HD catheter was removed on Wednesday the 9th at [facility] off [address] (right chest [facility] initially placed by Dr. Augustin Coupe at [facility] 01/15/14).  No pain in the area, no chest pain or pressure.  New onset of shortness of breath walking from his apt to the fitness center the day after the catheter was removed.  He notes he felt short of breath walking into the clinic, as well, which is atypical.  He feels like he felt when he used to get his heart rate up real high while exercising.  When he did finally get to the fitness center, he had to also stop several times mid-exercise due to exertional dyspnea.  He had to stop twice walking over here today.  He denies diaphoresis, nausea, tightness in his chest only has dyspnea.  He also has felt much more tired, sleeping over 8 hrs per night the past 2-3 nights.  Normally sleeps only a few hours.  He did not say if he had used the belsomra samples that had been provided.  Last stress test was at [facility] in [place], [place] 2 summers ago--he was in very good shape at that time.  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  Interestingly, there's not record of it and he never told me about it in April or late last month when I saw him.    Pt does not  want to be hospitalized.  Prefers to have outpatient workup for ekg changes.  Review of Systems:  Review of Systems  Constitutional: Positive for malaise/fatigue. Negative for fever, chills and diaphoresis.  HENT: Negative for hearing loss.   Eyes: Negative for blurred vision.  Respiratory: Positive for shortness of breath. Negative for cough, sputum production and wheezing.   Cardiovascular: Negative for chest pain, palpitations and leg swelling.  Gastrointestinal: Negative for abdominal pain.  Genitourinary: Negative for dysuria.  Musculoskeletal: Negative for falls.  Neurological: Positive for weakness. Negative for dizziness and loss of consciousness.  Endo/Heme/Allergies: Does not bruise/bleed easily.  Psychiatric/Behavioral: Negative for memory loss.    Past Medical History  Diagnosis Date  . CKD (chronic kidney disease) stage 5, GFR less than 15 ml/min (HCC)   . Unspecified essential hypertension   . GERD (gastroesophageal reflux disease)   . Gout   . Herpes zoster 04/2012  . Heart murmur     as a teen  . Shortness of breath dyspnea   . Anxiety   . Arthritis   . Complication of anesthesia     " one time my heart stopped due to a medication that I was on."     Past Surgical History  Procedure Laterality Date  . Appendectomy  1944  . Cataract extraction  w/ intraocular lens implant Bilateral 2014    [facility]  . Dialysis fistula creation  08/04/2012 and 10/13    Dr. Harden Mo  . Colonoscopy  2013    Dr. Annamaria Helling [place], [place].  Marland Kitchen Av fistula placement Left     Left Cimino AVF placed in [place]   . Insertion of dialysis catheter Right 01-15-14    Right chest TDC placed by Dr. Augustin Coupe at [facility]  . Eye surgery      Cataract  . Av fistula placement Right 03/06/2014    Procedure: ARTERIOVENOUS (AV) FISTULA CREATION-right radiocephalic;  Surgeon: Mal Misty, MD;  Location: [place];  Service: Vascular;  Laterality: Right;  . Ligation of competing  branches of arteriovenous fistula Right 05/08/2014    Procedure: LIGATION OF COMPETING BRANCHES OF RIGHT ARM RADIOCEPHALIC ARTERIOVENOUS FISTULA;  Surgeon: Mal Misty, MD;  Location: [place];  Service: Vascular;  Laterality: Right;  . Av fistula placement Right 07/15/2014    Procedure: ARTERIOVENOUS (AV) FISTULA CREATION;  Surgeon: Elam Dutch, MD;  Location: [facility] OR;  Service: Vascular;  Laterality: Right;  . Ligation of arteriovenous  fistula Right 07/15/2014    Procedure: LIGATION OF ARTERIOVENOUS  FISTULA  (RIGHT RADIOCEPHALIC);  Surgeon: Elam Dutch, MD;  Location: [place];  Service: Vascular;  Laterality: Right;  . Revison of arteriovenous fistula Right 07/15/2014    Procedure: EXPLORATION OF ARTERIOVENOUS FISTULA;  Surgeon: Elam Dutch, MD;  Location: [place];  Service: Vascular;  Laterality: Right;    Social History:   reports that he has never smoked. He has never used smokeless tobacco. He reports that he drinks about 1.2 oz of alcohol per week. He reports that he does not use illicit drugs.  No Known Allergies  Medications: Patient's Medications  New Prescriptions   No medications on file  Previous Medications   ALLOPURINOL (ZYLOPRIM) 100 MG TABLET    Take 1 tablet (100 mg total) by mouth daily.   AMLODIPINE (NORVASC) 10 MG TABLET    Take one tablet by mouth once daily to control blood pressure   ASPIRIN EC 81 MG TABLET    Take 81 mg by mouth at bedtime.    ATORVASTATIN (LIPITOR) 20 MG TABLET    Take one tablet by mouth once daily for cholesterol   MELATONIN 10 MG TABS    Take 10 mg by mouth at bedtime. Take the night before dialysis   MULTIVITAMIN (RENA-VIT) TABS TABLET    Take 1 tablet by mouth daily.    OMEPRAZOLE (PRILOSEC) 20 MG CAPSULE    Take 20 mg by mouth 4 (four) times a week. Sunday, Tuesday, Thursday and Saturday   SUVOREXANT (BELSOMRA) 10 MG TABS    Take 10 mg by mouth at bedtime as needed.   VITAMIN D, ERGOCALCIFEROL, (DRISDOL) 50000 UNITS CAPS CAPSULE     Take 50,000 Units by mouth every 14 (fourteen) days. 1st and 15th of each month  Modified Medications   No medications on file  Discontinued Medications   No medications on file     Physical Exam: Filed Vitals:   11/20/14 1631  BP: 110/62  Pulse: 64  Temp: 98 F (36.7 C)  TempSrc: Oral  Weight: 171 lb (77.565 kg)  SpO2: 99%   Body mass index is 24.9 kg/(m^2). Physical Exam  Constitutional: He is oriented to person, place, and time. No distress.  Neck: Neck supple. No JVD present.  Cardiovascular: Normal rate, regular rhythm and  intact distal pulses.   Murmur heard. Pulmonary/Chest: Effort normal and breath sounds normal. No respiratory distress.  Dyspneic on exertion when walking down hall, sats dropped from 99 to 95% on RA after walking up and down the hall of the office  Abdominal: Soft. Bowel sounds are normal.  Musculoskeletal: Normal range of motion.  Neurological: He is alert and oriented to person, place, and time.  Skin: Skin is warm and dry. He is not diaphoretic.     Labs reviewed: Basic Metabolic Panel:  Recent Labs  01/24/14 03/01/14  05/08/14 0930 07/15/14 0944 07/16/14 0710  NA 139 140  < > 138 140 138  K 4.6 4.7  < > 4.7 5.1 4.4  CL  --   --   --   --   --  102  CO2  --   --   --   --   --  22  GLUCOSE  --   --   < > 101* 110* 109*  BUN  --   --   --   --   --  56*  CREATININE 6.5* 6.9*  --   --   --  8.01*  CALCIUM  --   --   --   --   --  9.3  < > = values in this interval not displayed. Liver Function Tests: No results for input(s): AST, ALT, ALKPHOS, BILITOT, PROT, ALBUMIN in the last 8760 hours. No results for input(s): LIPASE, AMYLASE in the last 8760 hours. No results for input(s): AMMONIA in the last 8760 hours. CBC:  Recent Labs  01/24/14 02/28/14  05/08/14 0930 07/15/14 0944 07/16/14 0710  WBC 5.8 4.5  --   --   --  7.6  HGB 10.3* 11.1*  < > 12.9* 12.9* 10.0*  HCT 31* 34*  < > 38.0* 38.0* 30.3*  MCV  --   --   --   --   --   98.1  PLT 225  --   --   --   --  227  < > = values in this interval not displayed. Lipid Panel: No results for input(s): CHOL, HDL, LDLCALC, TRIG, CHOLHDL, LDLDIRECT in the last 8760 hours. No results found for: HGBA1C  Procedures since last appt: EKG today:  Sinus rhythm at 81, notable ST depression in inferior leads  Patient Care Team: Gayland Curry, DO as PCP - General (Geriatric Medicine) Mauricia Area, MD as Consulting Physician (Nephrology) Dwana Melena, MD as Attending Physician (Nephrology)  Assessment/Plan 1. Dyspnea on exertion - due to his probable MI from last week with ekg changes -remains dyspneic on exertion, but vitals including sats are stable - Ambulatory referral to Cardiology urgently was placed -pt does not want to be admitted to the hospital due to a bad experience last time of being awoken multiple times at night for labs - EKG 12-Lead with new ischemia in inferior leads concerning for recent infarct (sometime between March and now, but symptoms began one week ago) -cont asa therapy, statin, norvasc  2. Other fatigue - due to #4 -has some chronically related to his HD and altered sleep patterns but much worse now - Ambulatory referral to Cardiology  3. ESRD on dialysis [facility]) -symptoms began after right HD catheter removed last week  4. Cardiac ischemia -as above causing dyspnea on exertion  Labs/tests ordered:   Orders Placed This Encounter  Procedures  . Ambulatory referral to Cardiology    Referral Priority:  Urgent  Referral Type:  Consultation    Referral Reason:  Specialty Services Required    Requested Specialty:  Cardiology    Number of Visits Requested:  1  . EKG 12-Lead    Next appt:  TBD  Brennden Masten L. Alverta Caccamo, D.O. [facility] [address], [place] [address] Cell Phone (Mon-Fri 8am-5pm):  [phone] On Call:  [phone] & follow prompts after 5pm & weekends Office Phone:   [phone] Office Fax:  [phone]

## 2014-11-21 DIAGNOSIS — N2581 Secondary hyperparathyroidism of renal origin: Secondary | ICD-10-CM | POA: Diagnosis not present

## 2014-11-21 DIAGNOSIS — D631 Anemia in chronic kidney disease: Secondary | ICD-10-CM | POA: Diagnosis not present

## 2014-11-21 DIAGNOSIS — D509 Iron deficiency anemia, unspecified: Secondary | ICD-10-CM | POA: Diagnosis not present

## 2014-11-21 DIAGNOSIS — N184 Chronic kidney disease, stage 4 (severe): Secondary | ICD-10-CM | POA: Diagnosis not present

## 2014-11-21 DIAGNOSIS — N186 End stage renal disease: Secondary | ICD-10-CM | POA: Diagnosis not present

## 2014-11-23 DIAGNOSIS — D509 Iron deficiency anemia, unspecified: Secondary | ICD-10-CM | POA: Diagnosis not present

## 2014-11-23 DIAGNOSIS — N184 Chronic kidney disease, stage 4 (severe): Secondary | ICD-10-CM | POA: Diagnosis not present

## 2014-11-23 DIAGNOSIS — N186 End stage renal disease: Secondary | ICD-10-CM | POA: Diagnosis not present

## 2014-11-23 DIAGNOSIS — N2581 Secondary hyperparathyroidism of renal origin: Secondary | ICD-10-CM | POA: Diagnosis not present

## 2014-11-23 DIAGNOSIS — D631 Anemia in chronic kidney disease: Secondary | ICD-10-CM | POA: Diagnosis not present

## 2014-11-25 ENCOUNTER — Ambulatory Visit (INDEPENDENT_AMBULATORY_CARE_PROVIDER_SITE_OTHER): Payer: Medicare Other | Admitting: Nurse Practitioner

## 2014-11-25 ENCOUNTER — Other Ambulatory Visit: Payer: Self-pay

## 2014-11-25 ENCOUNTER — Telehealth: Payer: Self-pay | Admitting: *Deleted

## 2014-11-25 ENCOUNTER — Ambulatory Visit
Admission: RE | Admit: 2014-11-25 | Discharge: 2014-11-25 | Disposition: A | Discharge status: Home / Self Care | Payer: Medicare Other | Source: Ambulatory Visit | Attending: Nurse Practitioner | Admitting: Nurse Practitioner

## 2014-11-25 ENCOUNTER — Other Ambulatory Visit (HOSPITAL_COMMUNITY): Payer: Self-pay | Admitting: Nurse Practitioner

## 2014-11-25 ENCOUNTER — Other Ambulatory Visit: Payer: Self-pay | Admitting: Nurse Practitioner

## 2014-11-25 ENCOUNTER — Ambulatory Visit (HOSPITAL_BASED_OUTPATIENT_CLINIC_OR_DEPARTMENT_OTHER): Payer: Medicare Other

## 2014-11-25 ENCOUNTER — Encounter: Payer: Self-pay | Admitting: Nurse Practitioner

## 2014-11-25 ENCOUNTER — Ambulatory Visit (HOSPITAL_COMMUNITY)
Admission: RE | Admit: 2014-11-25 | Discharge: 2014-11-25 | Disposition: A | Discharge status: Home / Self Care | Payer: Medicare Other | Source: Ambulatory Visit | Attending: Nurse Practitioner | Admitting: Nurse Practitioner

## 2014-11-25 ENCOUNTER — Ambulatory Visit (HOSPITAL_COMMUNITY): Payer: Medicare Other

## 2014-11-25 VITALS — BP 128/72 | HR 79 | Ht 70.0 in | Wt 172.2 lb

## 2014-11-25 DIAGNOSIS — R0602 Shortness of breath: Secondary | ICD-10-CM | POA: Diagnosis not present

## 2014-11-25 DIAGNOSIS — I10 Essential (primary) hypertension: Secondary | ICD-10-CM | POA: Diagnosis not present

## 2014-11-25 DIAGNOSIS — I071 Rheumatic tricuspid insufficiency: Secondary | ICD-10-CM | POA: Diagnosis not present

## 2014-11-25 DIAGNOSIS — N186 End stage renal disease: Secondary | ICD-10-CM

## 2014-11-25 DIAGNOSIS — R0600 Dyspnea, unspecified: Secondary | ICD-10-CM

## 2014-11-25 DIAGNOSIS — I129 Hypertensive chronic kidney disease with stage 1 through stage 4 chronic kidney disease, or unspecified chronic kidney disease: Secondary | ICD-10-CM | POA: Insufficient documentation

## 2014-11-25 DIAGNOSIS — R9431 Abnormal electrocardiogram [ECG] [EKG]: Secondary | ICD-10-CM

## 2014-11-25 DIAGNOSIS — Z992 Dependence on renal dialysis: Secondary | ICD-10-CM

## 2014-11-25 DIAGNOSIS — R0609 Other forms of dyspnea: Secondary | ICD-10-CM

## 2014-11-25 DIAGNOSIS — I371 Nonrheumatic pulmonary valve insufficiency: Secondary | ICD-10-CM | POA: Diagnosis not present

## 2014-11-25 DIAGNOSIS — I340 Nonrheumatic mitral (valve) insufficiency: Secondary | ICD-10-CM | POA: Diagnosis not present

## 2014-11-25 DIAGNOSIS — I77810 Thoracic aortic ectasia: Secondary | ICD-10-CM | POA: Insufficient documentation

## 2014-11-25 DIAGNOSIS — N189 Chronic kidney disease, unspecified: Secondary | ICD-10-CM | POA: Diagnosis not present

## 2014-11-25 DIAGNOSIS — R29898 Other symptoms and signs involving the musculoskeletal system: Secondary | ICD-10-CM | POA: Insufficient documentation

## 2014-11-25 DIAGNOSIS — I352 Nonrheumatic aortic (valve) stenosis with insufficiency: Secondary | ICD-10-CM | POA: Insufficient documentation

## 2014-11-25 DIAGNOSIS — I259 Chronic ischemic heart disease, unspecified: Secondary | ICD-10-CM

## 2014-11-25 DIAGNOSIS — I517 Cardiomegaly: Secondary | ICD-10-CM | POA: Insufficient documentation

## 2014-11-25 LAB — CBC
HCT: 29.4 % — ABNORMAL LOW (ref 39.0–52.0)
Hemoglobin: 9.7 g/dL — ABNORMAL LOW (ref 13.0–17.0)
MCH: 32.1 pg (ref 26.0–34.0)
MCHC: 33 g/dL (ref 30.0–36.0)
MCV: 97.4 fL (ref 78.0–100.0)
MPV: 9.3 fL (ref 8.6–12.4)
Platelets: 226 K/uL (ref 150–400)
RBC: 3.02 MIL/uL — ABNORMAL LOW (ref 4.22–5.81)
RDW: 15.8 % — ABNORMAL HIGH (ref 11.5–15.5)
WBC: 7.1 K/uL (ref 4.0–10.5)

## 2014-11-25 LAB — BASIC METABOLIC PANEL WITH GFR
BUN: 51 mg/dL — ABNORMAL HIGH (ref 7–25)
CO2: 24 mmol/L (ref 20–31)
Calcium: 10.3 mg/dL (ref 8.6–10.3)
Chloride: 100 mmol/L (ref 98–110)
Creat: 6.97 mg/dL — ABNORMAL HIGH (ref 0.70–1.11)
Glucose, Bld: 84 mg/dL (ref 65–99)
Potassium: 4.4 mmol/L (ref 3.5–5.3)
Sodium: 141 mmol/L (ref 135–146)

## 2014-11-25 LAB — PROTIME-INR
INR: 1
Prothrombin Time: 13.3 s (ref 11.6–15.2)

## 2014-11-25 LAB — APTT: aPTT: 29 seconds (ref 24–37)

## 2014-11-25 MED ORDER — IOHEXOL 350 MG/ML SOLN
100.0000 mL | Freq: Once | INTRAVENOUS | Status: AC | PRN
  Administered 2014-11-25: 65 mL via INTRAVENOUS

## 2014-11-25 NOTE — Progress Notes (Signed)
CARDIOLOGY OFFICE NOTE  Date:  11/25/2014    Eric [name] Date of Birth: 12-27-1930 Medical Record [id number]  PCP:  Hollace Kinnier, DO  Cardiologist:  Caryl Comes (DOD)    Chief Complaint  Patient presents with  . Shortness of Breath    New patient visit - seen for Dr. Caryl Comes (DOD)  . Abnormal ECG    History of Present Illness: Eric [name] is a 81 y.o. male who presents today for a new patient visit. Seen for Dr. Caryl Comes (DOD).   He has a history of ESRD on HD via AV fistula, insomnia, HTN, and GERD. Reported stress test at [facility] in [place], [place] 2 summers ago - noted in the record that he "has a problem with the one chamber of his heart not being attached properly and he knows about it since childhood". No actual documentation of this. Reports history of cardiac arrest approximately 2 years ago while having fistula procedure - sounds like he had a stress test at that time.   Referred here today for DOE - started after having his HD catheter removed back on the 9th. He has had to stop while trying to exercise. Chronic sleep issues. Noted to have EKG changes (new inferior ischemia) and PCP felt like he had had an MI. Patient did not wish to be admitted due to his sleep issues - thus referred here.   Comes in today. Here with his wife. He notes that his stamina is "way off". He is short of breath with activity. He clearly relates the onset to the day his catheter was removed from his right chest but not immediate (had dialysis the following day). He has been on HD for 1 1/2 years. No actual chest pain. +SOB. No syncope. Never smoked. No real FH for CAD. Not diabetic. Not sure if his dyspnea has gotten worse but has had to stop his exercise class. Does have some "tension" in his neck" - noted today with walking here today. Wife notices that he has to stop more to rest.   Past Medical History  Diagnosis Date  . CKD (chronic kidney disease) stage 5, GFR less than 15 ml/min  (HCC)   . Unspecified essential hypertension   . GERD (gastroesophageal reflux disease)   . Gout   . Herpes zoster 04/2012  . Heart murmur     as a teen  . Shortness of breath dyspnea   . Anxiety   . Arthritis   . Complication of anesthesia     " one time my heart stopped due to a medication that I was on."     Past Surgical History  Procedure Laterality Date  . Appendectomy  1944  . Cataract extraction w/ intraocular lens implant Bilateral 2014    [place]  . Dialysis fistula creation  08/04/2012 and 10/13    Dr. Harden [place]  . Colonoscopy  2013    Dr. Annamaria Helling [place], [place].  Marland Kitchen Av fistula placement Left     Left Cimino AVF placed in [place]   . Insertion of dialysis catheter Right 01-15-14    Right chest TDC placed by Dr. Augustin Coupe at [facility]  . Eye surgery      Cataract  . Av fistula placement Right 03/06/2014    Procedure: ARTERIOVENOUS (AV) FISTULA CREATION-right radiocephalic;  Surgeon: Mal Misty, MD;  Location: [facility] OR;  Service: Vascular;  Laterality: Right;  . Ligation of competing branches of arteriovenous fistula Right  05/08/2014    Procedure: LIGATION OF COMPETING BRANCHES OF RIGHT ARM RADIOCEPHALIC ARTERIOVENOUS FISTULA;  Surgeon: Mal Misty, MD;  Location: [place];  Service: Vascular;  Laterality: Right;  . Av fistula placement Right 07/15/2014    Procedure: ARTERIOVENOUS (AV) FISTULA CREATION;  Surgeon: Elam Dutch, MD;  Location: [facility] OR;  Service: Vascular;  Laterality: Right;  . Ligation of arteriovenous  fistula Right 07/15/2014    Procedure: LIGATION OF ARTERIOVENOUS  FISTULA  (RIGHT RADIOCEPHALIC);  Surgeon: Elam Dutch, MD;  Location: [place];  Service: Vascular;  Laterality: Right;  . Revison of arteriovenous fistula Right 07/15/2014    Procedure: EXPLORATION OF ARTERIOVENOUS FISTULA;  Surgeon: Elam Dutch, MD;  Location: [facility] OR;  Service: Vascular;  Laterality: Right;     Medications: Current Outpatient Prescriptions    Medication Sig Dispense Refill  . allopurinol (ZYLOPRIM) 100 MG tablet Take 1 tablet (100 mg total) by mouth daily. 90 tablet 3  . amLODipine (NORVASC) 10 MG tablet Take one tablet by mouth once daily to control blood pressure 90 tablet 3  . aspirin EC 81 MG tablet Take 81 mg by mouth at bedtime.     Marland Kitchen atorvastatin (LIPITOR) 20 MG tablet Take one tablet by mouth once daily for cholesterol 90 tablet 1  . multivitamin (RENA-VIT) TABS tablet Take 1 tablet by mouth daily.     Marland Kitchen omeprazole (PRILOSEC) 20 MG capsule Take 20 mg by mouth 4 (four) times a week. Sunday, Tuesday, Thursday and Saturday    . Vitamin D, Ergocalciferol, (DRISDOL) 50000 UNITS CAPS capsule Take 50,000 Units by mouth every 14 (fourteen) days. 1st and 15th of each month     No current facility-administered medications for this visit.    Allergies: No Known Allergies  Social History: The patient  reports that he has never smoked. He has never used smokeless tobacco. He reports that he drinks about 1.2 oz of alcohol per week. He reports that he does not use illicit drugs.   Family History: The patient's family history includes Cancer in his father and mother.   Review of Systems: Please see the history of present illness.   Otherwise, the review of systems is positive for none.   All other systems are reviewed and negative.   Physical Exam: VS:  BP 128/72 mmHg  Pulse 79  Ht 5\' 10"  (1.778 m)  Wt 172 lb 3.2 oz (78.109 kg)  BMI 24.71 kg/m2 .  BMI Body mass index is 24.71 kg/(m^2).   Oxygen sat was 100%.   Wt Readings from Last 3 Encounters:  11/25/14 172 lb 3.2 oz (78.109 kg)  11/20/14 171 lb (77.565 kg)  10/30/14 174 lb (78.926 kg)    General: Pleasant. Elderly male who is alert and in no acute distress.  HEENT: Normal. Neck: Supple, no JVD, carotid bruits, or masses noted.  Cardiac: Regular rate and rhythm. Harsh outflow murmur of AS noted.  No edema.  Respiratory:  Lungs are clear to auscultation bilaterally  with normal work of breathing.  GI: Soft and nontender.  MS: No deformity or atrophy. Gait and ROM intact. Skin: Warm and dry. Color is normal.  Neuro:  Strength and sensation are intact and no gross focal deficits noted.  Psych: Alert, appropriate and with normal affect.   LABORATORY DATA:  EKG:  EKG is ordered today. This demonstrates NSR with marked ST and T wave changes.   EKG from 11/20/2014 - very poor quality fax - shows sinus rhythm. Inferior  T wave inversion, ST elevation in AVR and V1.  Lab Results  Component Value Date   WBC 7.6 07/16/2014   HGB 10.0* 07/16/2014   HCT 30.3* 07/16/2014   PLT 227 07/16/2014   GLUCOSE 109* 07/16/2014   CHOL 117 02/07/2013   TRIG 129 02/07/2013   HDL 36 02/07/2013   LDLCALC 55 02/07/2013   ALT 19 05/17/2013   AST 24 05/17/2013   NA 138 07/16/2014   K 4.4 07/16/2014   CL 102 07/16/2014   CREATININE 8.01* 07/16/2014   BUN 56* 07/16/2014   CO2 22 07/16/2014    BNP (last 3 results) No results for input(s): BNP in the last 8760 hours.  ProBNP (last 3 results) No results for input(s): PROBNP in the last 8760 hours.   Other Studies Reviewed Today:   Assessment/Plan: 1. DOE with fatigue - has significant murmur on exam - will get echocardiogram. Need to be concerned about aortic stenosis, ?RV enlargement, ?PFO/ASD. Further disposition to follow.   Preliminary echo with EF of 40% and inferior defect. Moderate AS noted. Waiting on final reading.   2. Abnormal EKG - reviewed with Dr. Caryl Comes - patient is subsequently seen with Dr. Caryl Comes - felt best to proceed on with echo, CT angio of the chest and possibly cardiac catheterization. The procedure has been reviewed with Mr. Brisbin and his wife and they are willing to proceed. The patient understands that risks include but are not limited to stroke (1 in 1000), death (1 in 83), kidney failure [usually temporary] (1 in 500), bleeding (1 in 200), allergic reaction [possibly serious] (1 in  200), and agrees to proceed.   3. ESRD on HD - ?clot on end of dialysis catheter that led to his symptoms - ?PE. Oxygen sat was 100% here today. Checking CT of the chest to rule out PE. Unfortunately, we were not able to get IV access - discussed with Dr. Caryl Comes - will send to [facility] with IV team to obtain access and proceed on with CT angio at [facility].   4. Anemia  Current medicines are reviewed with the patient today.  The patient does not have concerns regarding medicines other than what has been noted above.  The following changes have been made:  See above.  Labs/ tests ordered today include:    Orders Placed This Encounter  Procedures  . CT Angio Chest W/Cm &/Or Wo Cm  . Basic metabolic panel  . CBC  . Protime-INR  . APTT  . EKG 12-Lead  . ECHOCARDIOGRAM COMPLETE     Disposition:   Further disposition to follow. Overall situation is tenuous at best.   Patient is agreeable to this plan and will call if any problems develop in the interim.   Signed: Burtis Junes, RN, ANP-C 11/25/2014 11:37 AM  [place] [address] [place], [place]  [address] Phone: [phone] Fax: [phone]

## 2014-11-25 NOTE — Telephone Encounter (Signed)
S/w Santiago Glad at cath lab to change pt's procedure from a right heart cath to a left and right heart cath.

## 2014-11-25 NOTE — Patient Instructions (Addendum)
We will be checking the following labs today - BMET, CBC, PT, PTT   Tell your children that we are concerned about blockage of the arteries to his heart   Medication Instructions:    Continue with your current medicines.     Testing/Procedures To Be Arranged:  Echocardiogram - done  CT angio of the chest to rule out PE - will do this at the hospital later today go there when you leave here  Cardiac catheterization on Wednesday  Follow-Up:   Will determine after studies complete.     Other Special Instructions:   Cardiac catheterization on Wednesday  Your provider has recommended a cardiac catherization  You are scheduled for a cardiac catheterization on Wednesday, November 27, 2014 at 10AM with Dr. Irish Lack or associate.  Go to [facility] Floor Short Stay on Wednesday, November 27, 2014 at [place] thru the [place] entrance A No food or drink after midnight on Tuesday. You may take your medications with a sip of water on the day of your procedure.   Coronary Angiogram A coronary angiogram, also called coronary angiography, is an X-ray procedure used to look at the arteries in the heart. In this procedure, a dye (contrast dye) is injected through a long, hollow tube (catheter). The catheter is about the size of a piece of cooked spaghetti and is inserted through your groin, wrist, or arm. The dye is injected into each artery, and X-rays are then taken to show if there is a blockage in the arteries of your heart.  LET [facility] CARE PROVIDER KNOW ABOUT:  Any allergies you have, including allergies to shellfish or contrast dye.   All medicines you are taking, including vitamins, herbs, eye drops, creams, and over-the-counter medicines.   Previous problems you or members of your family have had with the use of anesthetics.   Any blood disorders you have.   Previous surgeries you have had.  History of kidney problems or failure.   Other medical  conditions you have.  RISKS AND COMPLICATIONS  Generally, a coronary angiogram is a safe procedure. However, about 1 person out of 1000 can have problems that may include:  Allergic reaction to the dye.  Bleeding/bruising from the access site or other locations.  Kidney injury, especially in people with impaired kidney function.  Stroke (rare).  Heart attack (rare).  Irregular rhythms (rare)  Death (rare)  BEFORE THE PROCEDURE   Do not eat or drink anything after midnight the night before the procedure or as directed by your health care provider.   Ask your health care provider about changing or stopping your regular medicines. This is especially important if you are taking diabetes medicines or blood thinners.  PROCEDURE  You may be given a medicine to help you relax (sedative) before the procedure. This medicine is given through an intravenous (IV) access tube that is inserted into one of your veins.   The area where the catheter will be inserted will be washed and shaved. This is usually done in the groin but may be done in the fold of your arm (near your elbow) or in the wrist.   A medicine will be given to numb the area where the catheter will be inserted (local anesthetic).   The health care provider will insert the catheter into an artery. The catheter will be guided by using a special type of X-ray (fluoroscopy) of the blood vessel being examined.   A special dye will then  be injected into the catheter, and X-rays will be taken. The dye will help to show where any narrowing or blockages are located in the heart arteries.    AFTER THE PROCEDURE   If the procedure is done through the leg, you will be kept in bed lying flat for several hours. You will be instructed to not bend or cross your legs.  The insertion site will be checked frequently.   The pulse in your feet or wrist will be checked frequently.   Additional blood tests, X-rays, and an  electrocardiogram may be done.       If you need a refill on your cardiac medications before your next appointment, please call your pharmacy.   Call the [place] office at [phone] if you have any questions, problems or concerns.

## 2014-11-26 ENCOUNTER — Other Ambulatory Visit: Payer: Self-pay | Admitting: Nurse Practitioner

## 2014-11-26 DIAGNOSIS — N184 Chronic kidney disease, stage 4 (severe): Secondary | ICD-10-CM | POA: Diagnosis not present

## 2014-11-26 DIAGNOSIS — N186 End stage renal disease: Secondary | ICD-10-CM | POA: Diagnosis not present

## 2014-11-26 DIAGNOSIS — D509 Iron deficiency anemia, unspecified: Secondary | ICD-10-CM | POA: Diagnosis not present

## 2014-11-26 DIAGNOSIS — N2581 Secondary hyperparathyroidism of renal origin: Secondary | ICD-10-CM | POA: Diagnosis not present

## 2014-11-26 DIAGNOSIS — D631 Anemia in chronic kidney disease: Secondary | ICD-10-CM | POA: Diagnosis not present

## 2014-11-26 MED ORDER — IOHEXOL 350 MG/ML SOLN
65.0000 mL | Freq: Once | INTRAVENOUS | Status: AC | PRN
  Administered 2014-11-25: 65 mL via INTRAVENOUS

## 2014-11-27 ENCOUNTER — Ambulatory Visit (HOSPITAL_COMMUNITY)
Admission: RE | Admit: 2014-11-27 | Discharge: 2014-11-27 | Disposition: A | Discharge status: Home / Self Care | Payer: Medicare Other | Source: Ambulatory Visit | Attending: Interventional Cardiology | Admitting: Interventional Cardiology

## 2014-11-27 ENCOUNTER — Encounter (HOSPITAL_COMMUNITY)
Admission: RE | Disposition: A | Discharge status: Home / Self Care | Payer: Self-pay | Source: Ambulatory Visit | Attending: Interventional Cardiology

## 2014-11-27 DIAGNOSIS — M1990 Unspecified osteoarthritis, unspecified site: Secondary | ICD-10-CM | POA: Insufficient documentation

## 2014-11-27 DIAGNOSIS — I255 Ischemic cardiomyopathy: Secondary | ICD-10-CM | POA: Diagnosis not present

## 2014-11-27 DIAGNOSIS — D649 Anemia, unspecified: Secondary | ICD-10-CM | POA: Insufficient documentation

## 2014-11-27 DIAGNOSIS — I120 Hypertensive chronic kidney disease with stage 5 chronic kidney disease or end stage renal disease: Secondary | ICD-10-CM | POA: Insufficient documentation

## 2014-11-27 DIAGNOSIS — I25110 Atherosclerotic heart disease of native coronary artery with unstable angina pectoris: Secondary | ICD-10-CM | POA: Diagnosis not present

## 2014-11-27 DIAGNOSIS — I2584 Coronary atherosclerosis due to calcified coronary lesion: Secondary | ICD-10-CM | POA: Diagnosis not present

## 2014-11-27 DIAGNOSIS — M109 Gout, unspecified: Secondary | ICD-10-CM | POA: Diagnosis not present

## 2014-11-27 DIAGNOSIS — N186 End stage renal disease: Secondary | ICD-10-CM | POA: Insufficient documentation

## 2014-11-27 DIAGNOSIS — K219 Gastro-esophageal reflux disease without esophagitis: Secondary | ICD-10-CM | POA: Diagnosis not present

## 2014-11-27 DIAGNOSIS — Z992 Dependence on renal dialysis: Secondary | ICD-10-CM | POA: Diagnosis not present

## 2014-11-27 DIAGNOSIS — F419 Anxiety disorder, unspecified: Secondary | ICD-10-CM | POA: Insufficient documentation

## 2014-11-27 DIAGNOSIS — I252 Old myocardial infarction: Secondary | ICD-10-CM | POA: Diagnosis not present

## 2014-11-27 DIAGNOSIS — I350 Nonrheumatic aortic (valve) stenosis: Secondary | ICD-10-CM | POA: Insufficient documentation

## 2014-11-27 DIAGNOSIS — I2582 Chronic total occlusion of coronary artery: Secondary | ICD-10-CM | POA: Insufficient documentation

## 2014-11-27 HISTORY — PX: CARDIAC CATHETERIZATION: SHX172

## 2014-11-27 LAB — POCT I-STAT 3, VENOUS BLOOD GAS (G3P V)
Acid-Base Excess: 3 mmol/L — ABNORMAL HIGH (ref 0.0–2.0)
Bicarbonate: 26.9 mEq/L — ABNORMAL HIGH (ref 20.0–24.0)
O2 SAT: 63 %
TCO2: 28 mmol/L (ref 0–100)
pCO2, Ven: 40.1 mmHg — ABNORMAL LOW (ref 45.0–50.0)
pH, Ven: 7.436 — ABNORMAL HIGH (ref 7.250–7.300)
pO2, Ven: 31 mmHg (ref 30.0–45.0)

## 2014-11-27 LAB — POCT I-STAT 3, ART BLOOD GAS (G3+)
ACID-BASE EXCESS: 2 mmol/L (ref 0.0–2.0)
BICARBONATE: 24.9 meq/L — AB (ref 20.0–24.0)
O2 Saturation: 99 %
TCO2: 26 mmol/L (ref 0–100)
pCO2 arterial: 29.3 mmHg — ABNORMAL LOW (ref 35.0–45.0)
pH, Arterial: 7.537 — ABNORMAL HIGH (ref 7.350–7.450)
pO2, Arterial: 118 mmHg — ABNORMAL HIGH (ref 80.0–100.0)

## 2014-11-27 SURGERY — RIGHT/LEFT HEART CATH AND CORONARY ANGIOGRAPHY

## 2014-11-27 MED ORDER — SODIUM CHLORIDE 0.9 % IJ SOLN: 3.0000 mL | INTRAMUSCULAR | Status: DC | PRN

## 2014-11-27 MED ORDER — SODIUM CHLORIDE 0.9 % IJ SOLN: 3.0000 mL | Freq: Two times a day (BID) | INTRAMUSCULAR | Status: DC

## 2014-11-27 MED ORDER — LIDOCAINE HCL (PF) 1 % IJ SOLN
INTRAMUSCULAR | Status: DC | PRN
  Administered 2014-11-27: 11:00:00

## 2014-11-27 MED ORDER — FENTANYL CITRATE (PF) 100 MCG/2ML IJ SOLN
INTRAMUSCULAR | Status: AC
  Filled 2014-11-27: qty 2

## 2014-11-27 MED ORDER — MIDAZOLAM HCL 2 MG/2ML IJ SOLN
INTRAMUSCULAR | Status: AC
  Filled 2014-11-27: qty 2

## 2014-11-27 MED ORDER — DIAZEPAM 5 MG PO TABS
ORAL_TABLET | ORAL | Status: AC
  Filled 2014-11-27: qty 1

## 2014-11-27 MED ORDER — HEPARIN (PORCINE) IN NACL 2-0.9 UNIT/ML-% IJ SOLN
INTRAMUSCULAR | Status: AC
  Filled 2014-11-27: qty 500

## 2014-11-27 MED ORDER — ASPIRIN 81 MG PO CHEW
CHEWABLE_TABLET | ORAL | Status: AC
  Administered 2014-11-27: 81 mg via ORAL
  Filled 2014-11-27: qty 1

## 2014-11-27 MED ORDER — ASPIRIN 81 MG PO CHEW
81.0000 mg | CHEWABLE_TABLET | ORAL | Status: AC
  Administered 2014-11-27: 81 mg via ORAL

## 2014-11-27 MED ORDER — IOHEXOL 350 MG/ML SOLN
INTRAVENOUS | Status: DC | PRN
  Administered 2014-11-27: 70 mL via INTRACARDIAC

## 2014-11-27 MED ORDER — MIDAZOLAM HCL 2 MG/2ML IJ SOLN
INTRAMUSCULAR | Status: DC | PRN
  Administered 2014-11-27 (×2): 1 mg via INTRAVENOUS

## 2014-11-27 MED ORDER — DIAZEPAM 5 MG PO TABS
5.0000 mg | ORAL_TABLET | ORAL | Status: AC
  Administered 2014-11-27: 5 mg via ORAL

## 2014-11-27 MED ORDER — LIDOCAINE HCL (PF) 1 % IJ SOLN
INTRAMUSCULAR | Status: AC
  Filled 2014-11-27: qty 30

## 2014-11-27 MED ORDER — SODIUM CHLORIDE 0.9 % IV SOLN
INTRAVENOUS | Status: DC
  Administered 2014-11-27: 09:00:00 via INTRAVENOUS

## 2014-11-27 MED ORDER — FENTANYL CITRATE (PF) 100 MCG/2ML IJ SOLN
INTRAMUSCULAR | Status: DC | PRN
  Administered 2014-11-27 (×2): 25 ug via INTRAVENOUS

## 2014-11-27 MED ORDER — SODIUM CHLORIDE 0.9 % IV SOLN: 250.0000 mL | INTRAVENOUS | Status: DC | PRN

## 2014-11-27 SURGICAL SUPPLY — 9 items
CATH INFINITI 5FR AL1 (CATHETERS) ×2 IMPLANT
CATH INFINITI 5FR MULTPACK ANG (CATHETERS) ×2 IMPLANT
CATH SWAN GANZ 7F STRAIGHT (CATHETERS) ×2 IMPLANT
KIT HEART LEFT (KITS) ×2 IMPLANT
PACK CARDIAC CATHETERIZATION (CUSTOM PROCEDURE TRAY) ×2 IMPLANT
SHEATH PINNACLE 5F 10CM (SHEATH) ×2 IMPLANT
SHEATH PINNACLE 7F 10CM (SHEATH) ×2 IMPLANT
TRANSDUCER W/STOPCOCK (MISCELLANEOUS) ×2 IMPLANT
WIRE EMERALD 3MM-J .035X150CM (WIRE) ×2 IMPLANT

## 2014-11-27 NOTE — H&P (View-Only) (Signed)
CARDIOLOGY OFFICE NOTE  Date:  11/25/2014    Eric [name] Date of Birth: 1930-03-17 Medical Record [id number]  PCP:  Hollace Kinnier, DO  Cardiologist:  Caryl Comes (DOD)    Chief Complaint  Patient presents with  . Shortness of Breath    New patient visit - seen for Dr. Caryl Comes (DOD)  . Abnormal ECG    History of Present Illness: Eric [name] is a 81 y.o. male who presents today for a new patient visit. Seen for Dr. Caryl Comes (DOD).   He has a history of ESRD on HD via AV fistula, insomnia, HTN, and GERD. Reported stress test at [facility] in [place], [place] 2 summers ago - noted in the record that he "has a problem with the one chamber of his heart not being attached properly and he knows about it since childhood". No actual documentation of this. Reports history of cardiac arrest approximately 2 years ago while having fistula procedure - sounds like he had a stress test at that time.   Referred here today for DOE - started after having his HD catheter removed back on the 9th. He has had to stop while trying to exercise. Chronic sleep issues. Noted to have EKG changes (new inferior ischemia) and PCP felt like he had had an MI. Patient did not wish to be admitted due to his sleep issues - thus referred here.   Comes in today. Here with his wife. He notes that his stamina is "way off". He is short of breath with activity. He clearly relates the onset to the day his catheter was removed from his right chest but not immediate (had dialysis the following day). He has been on HD for 1 1/2 years. No actual chest pain. +SOB. No syncope. Never smoked. No real FH for CAD. Not diabetic. Not sure if his dyspnea has gotten worse but has had to stop his exercise class. Does have some "tension" in his neck" - noted today with walking here today. Wife notices that he has to stop more to rest.   Past Medical History  Diagnosis Date  . CKD (chronic kidney disease) stage 5, GFR less than 15 ml/min  (HCC)   . Unspecified essential hypertension   . GERD (gastroesophageal reflux disease)   . Gout   . Herpes zoster 04/2012  . Heart murmur     as a teen  . Shortness of breath dyspnea   . Anxiety   . Arthritis   . Complication of anesthesia     " one time my heart stopped due to a medication that I was on."     Past Surgical History  Procedure Laterality Date  . Appendectomy  1944  . Cataract extraction w/ intraocular lens implant Bilateral 2014    [place]  . Dialysis fistula creation  08/04/2012 and 10/13    Dr. Harden [place]  . Colonoscopy  2013    Dr. Annamaria Helling [place], [place].  Marland Kitchen Av fistula placement Left     Left Cimino AVF placed in [place]   . Insertion of dialysis catheter Right 01-15-14    Right chest TDC placed by Dr. Augustin Coupe at [facility]  . Eye surgery      Cataract  . Av fistula placement Right 03/06/2014    Procedure: ARTERIOVENOUS (AV) FISTULA CREATION-right radiocephalic;  Surgeon: Mal Misty, MD;  Location: [facility] OR;  Service: Vascular;  Laterality: Right;  . Ligation of competing branches of arteriovenous fistula Right  05/08/2014    Procedure: LIGATION OF COMPETING BRANCHES OF RIGHT ARM RADIOCEPHALIC ARTERIOVENOUS FISTULA;  Surgeon: Mal Misty, MD;  Location: [place];  Service: Vascular;  Laterality: Right;  . Av fistula placement Right 07/15/2014    Procedure: ARTERIOVENOUS (AV) FISTULA CREATION;  Surgeon: Elam Dutch, MD;  [facility] OR;  Service: Vascular;  Laterality: Right;  . Ligation of arteriovenous  fistula Right 07/15/2014    Procedure: LIGATION OF ARTERIOVENOUS  FISTULA  (RIGHT RADIOCEPHALIC);  Surgeon: Elam Dutch, MD;  Location: [place];  Service: Vascular;  Laterality: Right;  . Revison of arteriovenous fistula Right 07/15/2014    Procedure: EXPLORATION OF ARTERIOVENOUS FISTULA;  Surgeon: Elam Dutch, MD;  Location: [facility] OR;  Service: Vascular;  Laterality: Right;     Medications: Current Outpatient Prescriptions    Medication Sig Dispense Refill  . allopurinol (ZYLOPRIM) 100 MG tablet Take 1 tablet (100 mg total) by mouth daily. 90 tablet 3  . amLODipine (NORVASC) 10 MG tablet Take one tablet by mouth once daily to control blood pressure 90 tablet 3  . aspirin EC 81 MG tablet Take 81 mg by mouth at bedtime.     Marland Kitchen atorvastatin (LIPITOR) 20 MG tablet Take one tablet by mouth once daily for cholesterol 90 tablet 1  . multivitamin (RENA-VIT) TABS tablet Take 1 tablet by mouth daily.     Marland Kitchen omeprazole (PRILOSEC) 20 MG capsule Take 20 mg by mouth 4 (four) times a week. Sunday, Tuesday, Thursday and Saturday    . Vitamin D, Ergocalciferol, (DRISDOL) 50000 UNITS CAPS capsule Take 50,000 Units by mouth every 14 (fourteen) days. 1st and 15th of each month     No current facility-administered medications for this visit.    Allergies: No Known Allergies  Social History: The patient  reports that he has never smoked. He has never used smokeless tobacco. He reports that he drinks about 1.2 oz of alcohol per week. He reports that he does not use illicit drugs.   Family History: The patient's family history includes Cancer in his father and mother.   Review of Systems: Please see the history of present illness.   Otherwise, the review of systems is positive for none.   All other systems are reviewed and negative.   Physical Exam: VS:  BP 128/72 mmHg  Pulse 79  Ht 5\' 10"  (1.778 m)  Wt 172 lb 3.2 oz (78.109 kg)  BMI 24.71 kg/m2 .  BMI Body mass index is 24.71 kg/(m^2).   Oxygen sat was 100%.   Wt Readings from Last 3 Encounters:  11/25/14 172 lb 3.2 oz (78.109 kg)  11/20/14 171 lb (77.565 kg)  10/30/14 174 lb (78.926 kg)    General: Pleasant. Elderly male who is alert and in no acute distress.  HEENT: Normal. Neck: Supple, no JVD, carotid bruits, or masses noted.  Cardiac: Regular rate and rhythm. Harsh outflow murmur of AS noted.  No edema.  Respiratory:  Lungs are clear to auscultation bilaterally  with normal work of breathing.  GI: Soft and nontender.  MS: No deformity or atrophy. Gait and ROM intact. Skin: Warm and dry. Color is normal.  Neuro:  Strength and sensation are intact and no gross focal deficits noted.  Psych: Alert, appropriate and with normal affect.   LABORATORY DATA:  EKG:  EKG is ordered today. This demonstrates NSR with marked ST and T wave changes.   EKG from 11/20/2014 - very poor quality fax - shows sinus rhythm. Inferior  T wave inversion, ST elevation in AVR and V1.  Lab Results  Component Value Date   WBC 7.6 07/16/2014   HGB 10.0* 07/16/2014   HCT 30.3* 07/16/2014   PLT 227 07/16/2014   GLUCOSE 109* 07/16/2014   CHOL 117 02/07/2013   TRIG 129 02/07/2013   HDL 36 02/07/2013   LDLCALC 55 02/07/2013   ALT 19 05/17/2013   AST 24 05/17/2013   NA 138 07/16/2014   K 4.4 07/16/2014   CL 102 07/16/2014   CREATININE 8.01* 07/16/2014   BUN 56* 07/16/2014   CO2 22 07/16/2014    BNP (last 3 results) No results for input(s): BNP in the last 8760 hours.  ProBNP (last 3 results) No results for input(s): PROBNP in the last 8760 hours.   Other Studies Reviewed Today:   Assessment/Plan: 1. DOE with fatigue - has significant murmur on exam - will get echocardiogram. Need to be concerned about aortic stenosis, ?RV enlargement, ?PFO/ASD. Further disposition to follow.   Preliminary echo with EF of 40% and inferior defect. Moderate AS noted. Waiting on final reading.   2. Abnormal EKG - reviewed with Dr. Caryl Comes - patient is subsequently seen with Dr. Caryl Comes - felt best to proceed on with echo, CT angio of the chest and possibly cardiac catheterization. The procedure has been reviewed with Mr. Isgro and his wife and they are willing to proceed. The patient understands that risks include but are not limited to stroke (1 in 1000), death (1 in 72), kidney failure [usually temporary] (1 in 500), bleeding (1 in 200), allergic reaction [possibly serious] (1 in  200), and agrees to proceed.   3. ESRD on HD - ?clot on end of dialysis catheter that led to his symptoms - ?PE. Oxygen sat was 100% here today. Checking CT of the chest to rule out PE. Unfortunately, we were not able to get IV access - discussed with Dr. Caryl Comes - will send to [facility] with IV team to obtain access and proceed on with CT angio at [facility].   4. Anemia  Current medicines are reviewed with the patient today.  The patient does not have concerns regarding medicines other than what has been noted above.  The following changes have been made:  See above.  Labs/ tests ordered today include:    Orders Placed This Encounter  Procedures  . CT Angio Chest W/Cm &/Or Wo Cm  . Basic metabolic panel  . CBC  . Protime-INR  . APTT  . EKG 12-Lead  . ECHOCARDIOGRAM COMPLETE     Disposition:   Further disposition to follow. Overall situation is tenuous at best.   Patient is agreeable to this plan and will call if any problems develop in the interim.   Signed: Burtis Junes, RN, ANP-C 11/25/2014 11:37 AM  [facility] [address] [place], [place]  [address] Phone: [phone] Fax: [phone]

## 2014-11-27 NOTE — Interval H&P Note (Signed)
History and Physical Interval Note:  11/27/2014 10:16 AM  Eric [name]  has presented today for cardiac cath with the diagnosis of aortic stenosis, dyspnea c/w unstable angina.  The various methods of treatment have been discussed with the patient and family. After consideration of risks, benefits and other options for treatment, the patient has consented to  Procedure(s): Right/Left Heart Cath and Coronary Angiography (N/A) as a surgical intervention .  The patient's history has been reviewed, patient examined, no change in status, stable for surgery.  I have reviewed the patient's chart and labs.  Questions were answered to the patient's satisfaction.    Cath Lab Visit (complete for each Cath Lab visit)  Clinical Evaluation Leading to the Procedure:   ACS: No.  Non-ACS:    Anginal Classification: CCS II  Anti-ischemic medical therapy: Minimal Therapy (1 class of medications)  Non-Invasive Test Results: No non-invasive testing performed  Prior CABG: No previous CABG         MCALHANY,CHRISTOPHER

## 2014-11-27 NOTE — Discharge Instructions (Signed)
Angiogram, Care After Refer to this sheet in the next few weeks. These instructions provide you with information about caring for yourself after your procedure. Your health care provider may also give you more specific instructions. Your treatment has been planned according to current medical practices, but problems sometimes occur. Call your health care provider if you have any problems or questions after your procedure. WHAT TO EXPECT AFTER THE PROCEDURE After your procedure, it is typical to have the following:  Bruising at the catheter insertion site that usually fades within 1-2 weeks.  Blood collecting in the tissue (hematoma) that may be painful to the touch. It should usually decrease in size and tenderness within 1-2 weeks. HOME CARE INSTRUCTIONS  Take medicines only as directed by your health care provider.  You may shower 24-48 hours after the procedure or as directed by your health care provider. Remove the bandage (dressing) and gently wash the site with plain soap and water. Pat the area dry with a clean towel. Do not rub the site, because this may cause bleeding.  Do not take baths, swim, or use a hot tub until your health care provider approves.  Check your insertion site every day for redness, swelling, or drainage.  Do not apply powder or lotion to the site.  Do not lift over 10 lb (4.5 kg) for 5 days after your procedure or as directed by your health care provider.  Ask your health care provider when it is okay to:  Return to work or school.  Resume usual physical activities or sports.  Resume sexual activity.  Do not drive home if you are discharged the same day as the procedure. Have someone else drive you.  You may drive 24 hours after the procedure unless otherwise instructed by your health care provider.  Do not operate machinery or power tools for 24 hours after the procedure or as directed by your health care provider.  If your procedure was done as an  outpatient procedure, which means that you went home the same day as your procedure, a responsible adult should be with you for the first 24 hours after you arrive home.  Keep all follow-up visits as directed by your health care provider. This is important. SEEK MEDICAL CARE IF:  You have a fever.  You have chills.  You have increased bleeding from the catheter insertion site. Hold pressure on the site. SEEK IMMEDIATE MEDICAL CARE IF:  You have unusual pain at the catheter insertion site.  You have redness, warmth, or swelling at the catheter insertion site.  You have drainage (other than a small amount of blood on the dressing) from the catheter insertion site.  The catheter insertion site is bleeding, and the bleeding does not stop after 30 minutes of holding steady pressure on the site.  The area near or just beyond the catheter insertion site becomes pale, cool, tingly, or numb.   This information is not intended to replace advice given to you by your health care provider. Make sure you discuss any questions you have with your health care provider.   Document Released: 07/09/2004 Document Revised: 01/11/2014 Document Reviewed: 05/24/2012 Elsevier Interactive Patient Education 2016 [place] After Refer to this sheet in the next few weeks. These instructions provide you with information about caring for yourself after your procedure. Your health care provider may also give you more specific instructions. Your treatment has been planned according to current medical practices, but problems sometimes occur. Call your  health care provider if you have any problems or questions after your procedure. WHAT TO EXPECT AFTER THE PROCEDURE After your procedure, it is typical to have the following:  Bruising at the catheter insertion site that usually fades within 1-2 weeks.  Blood collecting in the tissue (hematoma) that may be painful to the touch. It should usually  decrease in size and tenderness within 1-2 weeks. HOME CARE INSTRUCTIONS  Take medicines only as directed by your health care provider.  You may shower 24-48 hours after the procedure or as directed by your health care provider. Remove the bandage (dressing) and gently wash the site with plain soap and water. Pat the area dry with a clean towel. Do not rub the site, because this may cause bleeding.  Do not take baths, swim, or use a hot tub until your health care provider approves.  Check your insertion site every day for redness, swelling, or drainage.  Do not apply powder or lotion to the site.  Do not lift over 10 lb (4.5 kg) for 5 days after your procedure or as directed by your health care provider.  Ask your health care provider when it is okay to:  Return to work or school.  Resume usual physical activities or sports.  Resume sexual activity.  Do not drive home if you are discharged the same day as the procedure. Have someone else drive you.  You may drive 24 hours after the procedure unless otherwise instructed by your health care provider.  Do not operate machinery or power tools for 24 hours after the procedure or as directed by your health care provider.  If your procedure was done as an outpatient procedure, which means that you went home the same day as your procedure, a responsible adult should be with you for the first 24 hours after you arrive home.  Keep all follow-up visits as directed by your health care provider. This is important. SEEK MEDICAL CARE IF:  You have a fever.  You have chills.  You have increased bleeding from the catheter insertion site. Hold pressure on the site. SEEK IMMEDIATE MEDICAL CARE IF:  You have unusual pain at the catheter insertion site.  You have redness, warmth, or swelling at the catheter insertion site.  You have drainage (other than a small amount of blood on the dressing) from the catheter insertion site.  The catheter  insertion site is bleeding, and the bleeding does not stop after 30 minutes of holding steady pressure on the site.  The area near or just beyond the catheter insertion site becomes pale, cool, tingly, or numb.   This information is not intended to replace advice given to you by your health care provider. Make sure you discuss any questions you have with your health care provider.   Document Released: 07/09/2004 Document Revised: 01/11/2014 Document Reviewed: 05/24/2012 Elsevier Interactive Patient Education [employer].

## 2014-11-27 NOTE — Progress Notes (Addendum)
Site area: rt groin fa and fv sheath Site Prior to Removal:  Level 0 Pressure Applied For:  20 minutes Manual:   Yes  Patient Status During Pull:  stable Post Pull Site:  Level  0 Post Pull Instructions Given:  yes Post Pull Pulses Present:  yes Dressing Applied:  tegaderm Bedrest begins @  [id number] Comments:  IV saline locked

## 2014-11-29 ENCOUNTER — Encounter (HOSPITAL_COMMUNITY): Payer: Self-pay | Admitting: Cardiovascular Disease

## 2014-11-29 DIAGNOSIS — D631 Anemia in chronic kidney disease: Secondary | ICD-10-CM | POA: Diagnosis not present

## 2014-11-29 DIAGNOSIS — N186 End stage renal disease: Secondary | ICD-10-CM | POA: Diagnosis not present

## 2014-11-29 DIAGNOSIS — N2581 Secondary hyperparathyroidism of renal origin: Secondary | ICD-10-CM | POA: Diagnosis not present

## 2014-11-29 DIAGNOSIS — D509 Iron deficiency anemia, unspecified: Secondary | ICD-10-CM | POA: Diagnosis not present

## 2014-11-29 DIAGNOSIS — N184 Chronic kidney disease, stage 4 (severe): Secondary | ICD-10-CM | POA: Diagnosis not present

## 2014-12-01 DIAGNOSIS — N2581 Secondary hyperparathyroidism of renal origin: Secondary | ICD-10-CM | POA: Diagnosis not present

## 2014-12-01 DIAGNOSIS — D631 Anemia in chronic kidney disease: Secondary | ICD-10-CM | POA: Diagnosis not present

## 2014-12-01 DIAGNOSIS — N184 Chronic kidney disease, stage 4 (severe): Secondary | ICD-10-CM | POA: Diagnosis not present

## 2014-12-01 DIAGNOSIS — D509 Iron deficiency anemia, unspecified: Secondary | ICD-10-CM | POA: Diagnosis not present

## 2014-12-01 DIAGNOSIS — N186 End stage renal disease: Secondary | ICD-10-CM | POA: Diagnosis not present

## 2014-12-02 ENCOUNTER — Other Ambulatory Visit: Payer: Self-pay | Admitting: *Deleted

## 2014-12-02 ENCOUNTER — Institutional Professional Consult (permissible substitution) (INDEPENDENT_AMBULATORY_CARE_PROVIDER_SITE_OTHER): Payer: Medicare Other | Admitting: Surgery

## 2014-12-02 ENCOUNTER — Encounter: Payer: Self-pay | Admitting: Surgery

## 2014-12-02 VITALS — BP 109/64 | HR 100 | Resp 16 | Ht 70.0 in | Wt 172.0 lb

## 2014-12-02 DIAGNOSIS — I350 Nonrheumatic aortic (valve) stenosis: Secondary | ICD-10-CM

## 2014-12-02 DIAGNOSIS — I25119 Atherosclerotic heart disease of native coronary artery with unspecified angina pectoris: Secondary | ICD-10-CM

## 2014-12-02 DIAGNOSIS — I2510 Atherosclerotic heart disease of native coronary artery without angina pectoris: Secondary | ICD-10-CM

## 2014-12-02 MED ORDER — VITAMIN D (ERGOCALCIFEROL) 1.25 MG (50000 UNIT) PO CAPS: 50000.0000 [IU] | ORAL_CAPSULE | ORAL | Status: DC

## 2014-12-02 NOTE — Telephone Encounter (Signed)
[facility].

## 2014-12-03 ENCOUNTER — Encounter: Payer: Self-pay | Admitting: Surgery

## 2014-12-03 DIAGNOSIS — D509 Iron deficiency anemia, unspecified: Secondary | ICD-10-CM | POA: Diagnosis not present

## 2014-12-03 DIAGNOSIS — N186 End stage renal disease: Secondary | ICD-10-CM | POA: Diagnosis not present

## 2014-12-03 DIAGNOSIS — D631 Anemia in chronic kidney disease: Secondary | ICD-10-CM | POA: Diagnosis not present

## 2014-12-03 DIAGNOSIS — N2581 Secondary hyperparathyroidism of renal origin: Secondary | ICD-10-CM | POA: Diagnosis not present

## 2014-12-03 DIAGNOSIS — N184 Chronic kidney disease, stage 4 (severe): Secondary | ICD-10-CM | POA: Diagnosis not present

## 2014-12-03 NOTE — Progress Notes (Signed)
Cardiothoracic Surgery Consultation   PCP is REED, Jonelle Sidle, DO Referring Provider is Gayland Curry, DO  Chief Complaint  Patient presents with  . Coronary Artery Disease    eval for CABG/AVR...ECHO 11/25/14, CATH 11/27/14  . Aortic Stenosis  . Shortness of Breath    ON EXERTION    HPI:  The patient is an 81 year old gentleman with HTN and ESRD on HD who reports progressive exertional fatigue and shortness of breath over the past year or so. He had remained fairly active despite being on dialysis for the past year. He had a dialysis catheter in for a while and could not exercise while that was in. It was removed earlier this month and when he tried to exercise he developed shortness of breath, fatigue and chest tightness quickly. He had an ECG that showed new inferior changes and was referred to cardiology. Cardiac cath on 11/27/2014 showed severe 3-vessel coronary disease. The proximal and mid LAD were heavily calcified with 80% proximal and 99% mid LAD stenosis followed by a 90% mid stenosis. The LCX had a 60% stenosis in a large OM2 and an occluded OM3 that long but small in diameter or underfilled by the collat from the LAD. The RCA is occluded distally with faint filling of a small diameter PDA branch by collat from the LAD. He also had a heart murmur on exam and an echo showed at least moderate AS with a mean gradient of 36 mm Hg and a DI of 0.21. LVEF was reduced to 35% with anterior, septal and inferior wall motion abnormalities.   He moved here from [place] and lives with his wife at [facility]. He has a daughter who lives locally and two other children who live in other places but are in on a conference call with [place] today. His daughter reports that her mother is showing signs of some dementia with memory loss and repeating things frequently. They have both lived independently.  Past Medical History  Diagnosis Date  . CKD (chronic kidney disease) stage 5, GFR less than 15  ml/min (HCC)   . Unspecified essential hypertension   . GERD (gastroesophageal reflux disease)   . Gout   . Herpes zoster 04/2012  . Heart murmur     as a teen  . Shortness of breath dyspnea   . Anxiety   . Arthritis   . Complication of anesthesia     " one time my heart stopped due to a medication that I was on."     Past Surgical History  Procedure Laterality Date  . Appendectomy  1944  . Cataract extraction w/ intraocular lens implant Bilateral 2014    [facility]  . Dialysis fistula creation  08/04/2012 and 10/13    Dr. Harden Mo  . Colonoscopy  2013    Dr. Annamaria Helling [place], [place].  Marland Kitchen Av fistula placement Left     Left Cimino AVF placed in [place]   . Insertion of dialysis catheter Right 01-15-14    Right chest TDC placed by Dr. Augustin Coupe at [facility]  . Eye surgery      Cataract  . Av fistula placement Right 03/06/2014    Procedure: ARTERIOVENOUS (AV) FISTULA CREATION-right radiocephalic;  Surgeon: Mal Misty, MD;  Location: [facility];  Service: Vascular;  Laterality: Right;  . Ligation of competing branches of arteriovenous fistula Right 05/08/2014    Procedure: LIGATION OF COMPETING BRANCHES OF RIGHT ARM RADIOCEPHALIC ARTERIOVENOUS FISTULA;  Surgeon: Mal Misty, MD;  Location: [facility];  Service: Vascular;  Laterality: Right;  . Av fistula placement Right 07/15/2014    Procedure: ARTERIOVENOUS (AV) FISTULA CREATION;  Surgeon: Elam Dutch, MD;  Location: [facility];  Service: Vascular;  Laterality: Right;  . Ligation of arteriovenous  fistula Right 07/15/2014    Procedure: LIGATION OF ARTERIOVENOUS  FISTULA  (RIGHT RADIOCEPHALIC);  Surgeon: Elam Dutch, MD;  Location: [facility];  Service: Vascular;  Laterality: Right;  . Revison of arteriovenous fistula Right 07/15/2014    Procedure: EXPLORATION OF ARTERIOVENOUS FISTULA;  Surgeon: Elam Dutch, MD;  Location: [facility];  Service: Vascular;  Laterality: Right;  . Cardiac catheterization N/A 11/27/2014    Procedure:  Right/Left Heart Cath and Coronary Angiography;  Surgeon: Burnell Blanks, MD;  Location: [facility];  Service: Cardiovascular;  Laterality: N/A;    Family History  Problem Relation Age of Onset  . Cancer Father     lung  . Cancer Mother     Social History Social History  Substance Use Topics  . Smoking status: Never Smoker   . Smokeless tobacco: Never Used  . Alcohol Use: 1.2 oz/week    1 Glasses of wine, 1 Shots of liquor per week     Comment: one glass a day of either wine or liquor    Current Outpatient Prescriptions  Medication Sig Dispense Refill  . allopurinol (ZYLOPRIM) 100 MG tablet Take 1 tablet (100 mg total) by mouth daily. 90 tablet 3  . amLODipine (NORVASC) 10 MG tablet Take one tablet by mouth once daily to control blood pressure 90 tablet 3  . aspirin EC 81 MG tablet Take 81 mg by mouth at bedtime.     Marland Kitchen atorvastatin (LIPITOR) 20 MG tablet Take one tablet by mouth once daily for cholesterol 90 tablet 1  . multivitamin (RENA-VIT) TABS tablet Take 1 tablet by mouth daily.     Marland Kitchen omeprazole (PRILOSEC) 20 MG capsule Take 20 mg by mouth 4 (four) times a week. Sunday, Tuesday, Thursday and Saturday    . Vitamin D, Ergocalciferol, (DRISDOL) 50000 UNITS CAPS capsule Take 1 capsule (50,000 Units total) by mouth every 14 (fourteen) days. 1st and 15th of each month 24 capsule 0   No current facility-administered medications for this visit.    No Known Allergies  Review of Systems  Constitutional: Positive for activity change and fatigue. Negative for appetite change and unexpected weight change.  HENT: Negative.   Eyes: Negative.   Respiratory: Positive for chest tightness and shortness of breath.   Cardiovascular: Negative for chest pain, palpitations and leg swelling.  Gastrointestinal: Negative.   Endocrine: Negative.   Genitourinary: Negative.   Musculoskeletal: Negative.   Skin: Negative.   Allergic/Immunologic: Negative.   Neurological: Negative  for dizziness and syncope.  Hematological: Negative.   Psychiatric/Behavioral: Negative.     BP 109/64 mmHg  Pulse 100  Resp 16  Ht 5\' 10"  (1.778 m)  Wt 172 lb (78.019 kg)  BMI 24.68 kg/m2  SpO2 98% Physical Exam  Constitutional: He is oriented to person, place, and time. He appears well-developed and well-nourished. No distress.  HENT:  Head: Normocephalic and atraumatic.  Mouth/Throat: Oropharynx is clear and moist.  Eyes: EOM are normal. Pupils are equal, round, and reactive to light.  Neck: Normal range of motion. Neck supple. No thyromegaly present.  Cardiovascular: Normal rate, regular rhythm and intact distal pulses.   Murmur heard. 3/6 systolic murmur along RSB  Pulmonary/Chest: Effort normal and breath sounds normal. No respiratory distress. He has no wheezes. He has no rales. He exhibits no tenderness.  Abdominal: Soft. Bowel sounds are normal. He exhibits no distension and no mass. There is no tenderness.  Musculoskeletal: Normal range of motion. He exhibits no edema or tenderness.  Right upper arm AV fistula  Lymphadenopathy:    He has no cervical adenopathy.  Neurological: He is alert and oriented to person, place, and time. He has normal strength. No cranial nerve deficit or sensory deficit.  Skin: Skin is warm and dry.  Psychiatric: He has a normal mood and affect.     Diagnostic Tests:       [facility]            [address], [place] [address]              [phone]  ------------------------------------------------------------------- Echocardiography  Patient:  Shasta, Capstick MR #:    [id number] Study Date: 11/25/2014 Gender:   M Age:    50 Height:   177.8 cm Weight:   78 kg BSA:    1.97 m^2 Pt. Status: Room:  ATTENDING  Fransico Him, MD SONOGRAPHER Victorio Palm, RDCS ORDERING   Burtis Junes PERFORMING  [facility],  Outpatient  cc:  ------------------------------------------------------------------- LV EF: 35% -  40%  ------------------------------------------------------------------- Indications:   Aortic stenosis (I35.0). SOB (R06.09). Abnormal EKG (R94.31).  ------------------------------------------------------------------- History:  PMH: Acquired from the patient and from the patient&'s chart. Dyspnea and murmur. Dialysis-dependent chronic renal failure. Risk factors: Hypertension.  ------------------------------------------------------------------- Study Conclusions  - Left ventricle: The cavity size was normal. There was moderate concentric hypertrophy. Systolic function was moderately reduced. The estimated ejection fraction was in the range of 35% to 40%. There is akinesis of the mid-apicalinferoseptal myocardium. There is akinesis of the entireapical myocardium. There is akinesis of the apicallateral myocardium. There is akinesis of the basalinferior myocardium. There is akinesis of the apicalanterior, inferolateral, and inferior myocardium. There is akinesis of the midanteroseptal myocardium. There was an increased relative contribution of atrial contraction to ventricular filling. Doppler parameters are consistent with abnormal left ventricular relaxation (grade 1 diastolic dysfunction). - Aortic valve: The degree of aortic stenosis may be underestimated due to reduced LVF. Suggest dobutamine echo to assess change in mean AV gradient and AVA. Severe thickening and calcification. Valve mobility was restricted. There was moderate stenosis. There was mild regurgitation. Mean gradient (S): 36 mm Hg. Peak gradient (S): 63 mm Hg. Valve area (VTI): 1.85 cm^2. Valve area (Vmax): 1.29 cm^2. Valve area (Vmean): 1.42 cm^2. - Aorta: Ascending aortic diameter: 38 mm (S). - Ascending aorta: The ascending aorta was mildly dilated. - Mitral  valve: Calcified annulus. There was mild regurgitation. - Atrial septum: There was increased thickness of the septum, consistent with lipomatous hypertrophy. - Tricuspid valve: There was trivial regurgitation. - Pulmonic valve: There was trivial regurgitation. - Pulmonary arteries: PA peak pressure: 38 mm Hg (S).  Impressions:  - The right ventricular systolic pressure was increased consistent with mild pulmonary hypertension.  ------------------------------------------------------------------- Labs, prior tests, procedures, and surgery: ECG.   Abnormal. Echocardiography. M-mode, complete 2D, spectral Doppler, and color Doppler. Birthdate: Patient birthdate: [birth year]. Age: Patient is 81 yr old. Sex: Gender: male.  BMI: 24.7 kg/m^2. Blood pressure:   128/72 Patient status: Outpatient. Study date: Study date: 11/25/2014. Study time: 10:08 AM. Location: [facility]  -------------------------------------------------------------------  ------------------------------------------------------------------- Left  ventricle: The cavity size was normal. There was moderate concentric hypertrophy. Systolic function was moderately reduced. The estimated ejection fraction was in the range of 35% to 40%. Regional wall motion abnormalities:  There is akinesis of the mid-apicalinferoseptal myocardium. There is akinesis of the entireapical myocardium. There is akinesis of the apicallateral myocardium. There is akinesis of the basalinferior myocardium. There is akinesis of the apicalanterior, inferolateral, and inferior myocardium. There is akinesis of the midanteroseptal myocardium. There was an increased relative contribution of atrial contraction to ventricular filling. Doppler parameters are consistent with abnormal left ventricular relaxation (grade 1 diastolic dysfunction).  ------------------------------------------------------------------- Aortic valve:  The degree of aortic stenosis may be underestimated due to reduced LVF. Suggest dobutamine echo to assess change in mean AV gradient and AVA. Trileaflet. Severe thickening and calcification. Valve mobility was restricted. Doppler:  There was moderate stenosis.  There was mild regurgitation.  VTI ratio of LVOT to aortic valve: 0.3. Valve area (VTI): 1.85 cm^2. Indexed valve area (VTI): 0.94 cm^2/m^2. Peak velocity ratio of LVOT to aortic valve: 0.21. Valve area (Vmax): 1.29 cm^2. Indexed valve area (Vmax): 0.65 cm^2/m^2. Mean velocity ratio of LVOT to aortic valve: 0.23. Valve area (Vmean): 1.42 cm^2. Indexed valve area (Vmean): 0.72 cm^2/m^2.  Mean gradient (S): 36 mm Hg. Peak gradient (S): 63 mm Hg.  ------------------------------------------------------------------- Aorta: Aortic root: The aortic root was normal in size. Ascending aorta: The ascending aorta was mildly dilated.  ------------------------------------------------------------------- Mitral valve:  Calcified annulus. Mobility was not restricted. Doppler: Transvalvular velocity was within the normal range. There was no evidence for stenosis. There was mild regurgitation.  ------------------------------------------------------------------- Left atrium: The atrium was normal in size.  ------------------------------------------------------------------- Atrial septum: There was increased thickness of the septum, consistent with lipomatous hypertrophy.  ------------------------------------------------------------------- Right ventricle: The cavity size was normal. Wall thickness was normal. Systolic function was normal.  ------------------------------------------------------------------- Pulmonic valve:  Structurally normal valve.  Cusp separation was normal. Doppler: Transvalvular velocity was within the normal range. There was no evidence for stenosis. There was  trivial regurgitation.  ------------------------------------------------------------------- Tricuspid valve:  Structurally normal valve.  Doppler: Transvalvular velocity was within the normal range. There was trivial regurgitation.  ------------------------------------------------------------------- Pulmonary artery:  The main pulmonary artery was normal-sized. Systolic pressure was within the normal range.  ------------------------------------------------------------------- Right atrium: The atrium was normal in size.  ------------------------------------------------------------------- Pericardium: There was no pericardial effusion.  ------------------------------------------------------------------- Systemic veins: Inferior vena cava: The vessel was normal in size.  ------------------------------------------------------------------- Measurements  Left ventricle              Value     Reference LV ID, ED, PLAX chordal          46.1 mm    43 - 52 LV ID, ES, PLAX chordal          26.9 mm    23 - 38 LV fx shortening, PLAX chordal      42  %    >=29 LV PW thickness, ED            13.6 mm    --------- IVS/LV PW ratio, ED            1.01      <=1.3 Stroke volume, 2D             109  ml    --------- Stroke volume/bsa, 2D           55  ml/m^2  --------- LV e&', lateral  4.68 cm/s   --------- LV E/e&', lateral             14.91     --------- LV e&', medial               6.85 cm/s   --------- LV E/e&', medial              10.19     --------- LV e&', average              5.77 cm/s   --------- LV E/e&', average             12.11     ---------  Ventricular septum            Value     Reference IVS thickness, ED              13.7 mm    ---------  LVOT                   Value     Reference LVOT ID, S                28  mm    --------- LVOT area                 6.16 cm^2   --------- LVOT peak velocity, S           82.8 cm/s   --------- LVOT mean velocity, S           64.5 cm/s   --------- LVOT VTI, S                28  cm    ---------  Aortic valve               Value     Reference Aortic valve peak velocity, S       396  cm/s   --------- Aortic valve mean velocity, S       280  cm/s   --------- Aortic valve VTI, S            93.3 cm    --------- Aortic mean gradient, S          36  mm Hg  --------- Aortic peak gradient, S          63  mm Hg  --------- VTI ratio, LVOT/AV            0.3      --------- Aortic valve area, VTI          1.85 cm^2   --------- Aortic valve area/bsa, VTI        0.94 cm^2/m^2 --------- Velocity ratio, peak, LVOT/AV       0.21      --------- Aortic valve area, peak velocity     1.29 cm^2   --------- Aortic valve area/bsa, peak        0.65 cm^2/m^2 --------- velocity Velocity ratio, mean, LVOT/AV       0.23      --------- Aortic valve area, mean velocity     1.42 cm^2   --------- Aortic valve area/bsa, mean        0.72 cm^2/m^2 --------- velocity  Aorta                   Value     Reference Aortic root ID, ED            30  mm    ---------  Ascending aorta ID, A-P, S        38  mm    ---------  Left atrium                Value     Reference LA ID, A-P, ES              43  mm    --------- LA ID/bsa, A-P              2.18 cm/m^2  <=2.2 LA volume, S                47.5 ml    --------- LA volume/bsa, S             24.1 ml/m^2  --------- LA volume, ES, 1-p A4C          43.1 ml    --------- LA volume/bsa, ES, 1-p A4C        21.9 ml/m^2  --------- LA volume, ES, 1-p A2C          44.1 ml    --------- LA volume/bsa, ES, 1-p A2C        22.4 ml/m^2  ---------  Mitral valve               Value     Reference Mitral E-wave peak velocity        69.8 cm/s   --------- Mitral A-wave peak velocity        98  cm/s   --------- Mitral deceleration time         208  ms    150 - 230 Mitral E/A ratio, peak          0.7      ---------  Pulmonary arteries            Value     Reference PA pressure, S, DP        (H)   38  mm Hg  <=30  Tricuspid valve              Value     Reference Tricuspid regurg peak velocity      273  cm/s   --------- Tricuspid peak RV-RA gradient       30  mm Hg  ---------  Systemic veins              Value     Reference Estimated CVP               8   mm Hg  ---------  Right ventricle              Value     Reference TAPSE                   34  mm    --------- RV pressure, S, DP        (H)   38  mm Hg  <=30 RV s&', lateral, S             19  cm/s   ---------  Legend: (L) and (H) mark values outside specified reference range.  ------------------------------------------------------------------- Prepared and Electronically Authenticated by  Fransico Him, MD 2016-11-21T13:36:14   Burnell Blanks, MD (Primary)      Procedures    Right/Left Heart Cath and Coronary Angiography    Conclusion     Mid LAD lesion, 99% stenosed.  Dist LAD lesion, 90% stenosed.  Ost  2nd Diag lesion, 90% stenosed.  Prox  LAD to Mid LAD lesion, 80% stenosed.  2nd Mrg lesion, 60% stenosed.  Ost 3rd Mrg lesion, 100% stenosed.  Prox RCA lesion, 95% stenosed.  Mid RCA lesion, 99% stenosed.  Dist RCA lesion, 100% stenosed.  1. Severe triple vessel CAD 2. Severe stenosis in the heavily calcified mid LAD  3. Chronic occlusion OM branch 4. Chronic occlusion distal RCA 5. Ischemic cardiomyopathy with normal filling pressures.  6. Likely moderately severe AS by echo (based on appearance of the valve with restricted leaflet motion, mean gradient over 46mmHg).  Recommendations: His CAD is diffuse and not favorable for PCI. He is a very active 81 yo man and despite ESRD on HD, he is functional. I will refer to CT surgery to discuss CABG with combined AVR.      Indications    Coronary artery disease involving native coronary artery of native heart with unstable angina pectoris (HCC) [I25.110 (ICD-10-CM)]   Aortic stenosis [I35.0 (ICD-10-CM)]    Technique and Indications    Estimated blood loss <50 mL. Indication: Dyspnea concerning for unstable angina. Echo with moderate reduction LV systolic function. EKG with diffuse abnormalities.   Procedure: The risks, benefits, complications, treatment options, and expected outcomes were discussed with the patient. The patient and/or family concurred with the proposed plan, giving informed consent. The patient was brought to the cath lab after IV hydration was begun and oral premedication was given. The patient was further sedated with Versed and Fentanyl. The right groin was prepped and draped in the usual manner. Using the modified Seldinger access technique, a 5 French sheath was placed in the right femoral artery. A 7 French sheath was placed in the right femoral vein. A balloon tipped catheter was used to perform a right heart catheterization. Standard diagnostic catheters were used to perform selective coronary angiography.  An AL-1 catheter was used to cross the aortic valve. LV pressures measured. No LV gram was performed.   There were no immediate complications. The patient was taken to the recovery area in stable condition.    Coronary Findings    Dominance: Right   Left Anterior Descending   . Prox LAD to Mid LAD lesion, 80% stenosed. Calcified diffuse.   . Mid LAD lesion, 99% stenosed. Discrete.   Jorene Minors LAD lesion, 90% stenosed. Discrete.   . First Diagonal Branch   The vessel is small in size.   Marland Kitchen Second Diagonal Branch   The vessel is small in size.   Colon Flattery 2nd Diag lesion, 90% stenosed. Discrete.     Ramus Intermedius  . Vessel is moderate in size. There is mild the vessel.     Left Circumflex   . Second Obtuse Marginal Branch   The vessel is moderate in size.   . 2nd Mrg lesion, 60% stenosed. Diffuse.   . Third Obtuse Marginal Branch   The vessel is moderate in size. 3rd Mrg filled by collaterals from Dist LAD.   Colon Flattery 3rd Mrg lesion, 100% stenosed. Discrete chronic total occlusion.     Right Coronary Artery   . Prox RCA lesion, 95% stenosed. Discrete.   . Mid RCA lesion, 99% stenosed. Diffuse.   . Dist RCA lesion, 100% stenosed. Chronic total occlusion.   . Right Posterior Descending Artery   RPDA filled by collaterals from Dist LAD.   . Right Posterior Atrioventricular Branch   Post Atrio filled by collaterals from Acute Mrg.       Right Heart Pressures LV EDP is normal.  Left Heart    Aortic Valve There is moderate aortic valve stenosis.    Coronary Diagrams    Diagnostic Diagram            Implants    Name ID Temporary Type Supply   No information to display    PACS Images    Show images for Cardiac catheterization     Link to Procedure Log    Procedure Log      Hemo Data       Most Recent Value   Fick Cardiac Output  5.49 L/min   Fick Cardiac Output Index  2.8 (L/min)/BSA   Aortic Mean Gradient  11.1 mmHg   Aortic Peak  Gradient  10 mmHg   Aortic Valve Area  1.94   Aortic Value Area Index  0.99 cm2/BSA   RA A Wave  10 mmHg   RA V Wave  7 mmHg   RA Mean  7 mmHg   RV Systolic Pressure  33 mmHg   RV Diastolic Pressure  3 mmHg   RV EDP  9 mmHg   PA Systolic Pressure  32 mmHg   PA Diastolic Pressure  12 mmHg   PA Mean  20 mmHg   PW A Wave  16 mmHg   PW V Wave  14 mmHg   PW Mean  11 mmHg   AO Systolic Pressure  [id number] mmHg   AO Diastolic Pressure  61 mmHg   AO Mean  85 mmHg   LV Systolic Pressure  [id number] mmHg   LV Diastolic Pressure  10 mmHg   LV EDP  18 mmHg   Arterial Occlusion Pressure Extended Systolic Pressure  [id number] mmHg   Arterial Occlusion Pressure Extended Diastolic Pressure  59 mmHg   Arterial Occlusion Pressure Extended Mean Pressure  85 mmHg   Left Ventricular Apex Extended Systolic Pressure  0000000 mmHg   Left Ventricular Apex Extended Diastolic Pressure  10 mmHg   Left Ventricular Apex Extended EDP Pressure  17 mmHg   QP/QS  1   TPVR Index  7.14 HRUI   TSVR Index  30.35 HRUI   PVR SVR Ratio  0.12   TPVR/TSVR Ratio  0.24     Impression:  He has severe 3- vessel coronary artery disease with a diffusely calcified proximal to mid LAD with multiple high grade stenoses in this area. The distal vessel looks like a reasonable vessel for grafting and supplies collaterals to the occluded OM3 and PDA. The OM and PDA look fairly small in diameter but may just be under-filled. There is also at least moderate and probably severe symptomatic aortic stenosis. I have personally reviewed his echo and the aortic valve is trileaflet with heavy leaflet calcification and reduced mobility. The mean gradient is 36 and the DI is 0.21 with a reduced EF of 35-40% so he may have low EF, lower gradient severe AS. I agree with Dr. Angelena Form that CABG and AVR is the best treatment for him despite his age and end stage renal failure. He is in fairly good condition for his age and still trying to  remain as active as possible, limited by chronic systolic and diastolic heart failure with exertional fatigue and shortness of breath. I discussed the operative procedure with the patient and family including alternatives, benefits and risks; including but not limited to bleeding, blood transfusion, infection, stroke, myocardial infarction, graft failure, heart block requiring a permanent pacemaker, organ dysfunction, and death.  [name] understands and agrees to  proceed.    Plan:  CABG and AVR using a tissue valve on Monday 12/09/2014.  Gaye Pollack, MD [facility] [phone]

## 2014-12-04 DIAGNOSIS — Z992 Dependence on renal dialysis: Secondary | ICD-10-CM | POA: Diagnosis not present

## 2014-12-04 DIAGNOSIS — N186 End stage renal disease: Secondary | ICD-10-CM | POA: Diagnosis not present

## 2014-12-04 DIAGNOSIS — N2889 Other specified disorders of kidney and ureter: Secondary | ICD-10-CM | POA: Diagnosis not present

## 2014-12-05 DIAGNOSIS — D509 Iron deficiency anemia, unspecified: Secondary | ICD-10-CM | POA: Diagnosis not present

## 2014-12-05 DIAGNOSIS — N186 End stage renal disease: Secondary | ICD-10-CM | POA: Diagnosis not present

## 2014-12-05 DIAGNOSIS — D631 Anemia in chronic kidney disease: Secondary | ICD-10-CM | POA: Diagnosis not present

## 2014-12-05 DIAGNOSIS — N2581 Secondary hyperparathyroidism of renal origin: Secondary | ICD-10-CM | POA: Diagnosis not present

## 2014-12-05 DIAGNOSIS — N184 Chronic kidney disease, stage 4 (severe): Secondary | ICD-10-CM | POA: Diagnosis not present

## 2014-12-05 NOTE — Pre-Procedure Instructions (Signed)
Armour Silver  12/05/2014     Your procedure is scheduled on : Monday December 09, 2014 at 7:30 AM.  Report to [facility] Admitting at 5:30 AM.  Call this number if you have problems the morning of surgery: [phone]    Remember:  Do not eat food or drink liquids after midnight.  Take these medicines the morning of surgery with A SIP OF WATER : Allopurinol (Zyloprim), Amlodipine (Norvasc)   Stop taking any vitamins, herbal medications, Ibuprofen, Advil, Motrin, Aleve, etc   Do not wear jewelry.  Do not wear lotions, powders, or cologne.    Men may shave face and neck.  Do not bring valuables to the hospital.  [facility] is not responsible for any belongings or valuables.  Contacts, dentures or bridgework may not be worn into surgery.  Leave your suitcase in the car.  After surgery it may be brought to your room.  For patients admitted to the hospital, discharge time will be determined by your treatment team.  Patients discharged the day of surgery will not be allowed to drive home.   Name and phone number of your driver:    Special instructions:  Shower using CHG soap the night before and the morning of your surgery  Please read over the following fact sheets that you were given. Pain Booklet, Coughing and Deep Breathing, Blood Transfusion Information, Open Heart Packet, MRSA Information and Surgical Site Infection Prevention

## 2014-12-06 ENCOUNTER — Encounter (HOSPITAL_COMMUNITY)
Admission: RE | Admit: 2014-12-06 | Discharge: 2014-12-06 | Disposition: A | Discharge status: Home / Self Care | Payer: Medicare Other | Source: Ambulatory Visit | Attending: Surgery | Admitting: Surgery

## 2014-12-06 ENCOUNTER — Encounter (HOSPITAL_COMMUNITY): Payer: Self-pay

## 2014-12-06 ENCOUNTER — Ambulatory Visit (HOSPITAL_COMMUNITY)
Admission: RE | Admit: 2014-12-06 | Discharge: 2014-12-06 | Disposition: A | Discharge status: Home / Self Care | Payer: Medicare Other | Source: Ambulatory Visit | Attending: Surgery | Admitting: Surgery

## 2014-12-06 VITALS — BP 124/60 | HR 78 | Temp 97.7°F | Resp 18 | Ht 68.5 in | Wt 171.6 lb

## 2014-12-06 DIAGNOSIS — I2510 Atherosclerotic heart disease of native coronary artery without angina pectoris: Secondary | ICD-10-CM | POA: Diagnosis not present

## 2014-12-06 DIAGNOSIS — N186 End stage renal disease: Secondary | ICD-10-CM | POA: Diagnosis not present

## 2014-12-06 DIAGNOSIS — I350 Nonrheumatic aortic (valve) stenosis: Secondary | ICD-10-CM

## 2014-12-06 DIAGNOSIS — I120 Hypertensive chronic kidney disease with stage 5 chronic kidney disease or end stage renal disease: Secondary | ICD-10-CM | POA: Diagnosis not present

## 2014-12-06 DIAGNOSIS — N2581 Secondary hyperparathyroidism of renal origin: Secondary | ICD-10-CM | POA: Diagnosis not present

## 2014-12-06 LAB — PULMONARY FUNCTION TEST
DL/VA % PRED: 85 %
DL/VA: 3.79 ml/min/mmHg/L
DLCO COR: 22.48 ml/min/mmHg
DLCO UNC % PRED: 65 %
DLCO UNC: 19.55 ml/min/mmHg
DLCO cor % pred: 75 %
FEF 25-75 POST: 2.4 L/s
FEF 25-75 PRE: 1.48 L/s
FEF2575-%CHANGE-POST: 62 %
FEF2575-%PRED-POST: 143 %
FEF2575-%PRED-PRE: 88 %
FEV1-%Change-Post: 7 %
FEV1-%PRED-POST: 99 %
FEV1-%Pred-Pre: 92 %
FEV1-PRE: 2.34 L
FEV1-Post: 2.52 L
FEV1FVC-%CHANGE-POST: 7 %
FEV1FVC-%PRED-PRE: 101 %
FEV6-%CHANGE-POST: 1 %
FEV6-%PRED-POST: 97 %
FEV6-%Pred-Pre: 95 %
FEV6-PRE: 3.18 L
FEV6-Post: 3.24 L
FEV6FVC-%CHANGE-POST: 1 %
FEV6FVC-%PRED-PRE: 104 %
FEV6FVC-%Pred-Post: 106 %
FVC-%Change-Post: 0 %
FVC-%Pred-Post: 90 %
FVC-%Pred-Pre: 90 %
FVC-Post: 3.26 L
FVC-Pre: 3.27 L
POST FEV1/FVC RATIO: 77 %
PRE FEV1/FVC RATIO: 72 %
Post FEV6/FVC ratio: 99 %
Pre FEV6/FVC Ratio: 97 %
RV % PRED: 97 %
RV: 2.53 L
TLC % pred: 93 %
TLC: 6.23 L

## 2014-12-06 LAB — COMPREHENSIVE METABOLIC PANEL
ALT: 13 U/L — AB (ref 17–63)
AST: 13 U/L — AB (ref 15–41)
Albumin: 3.6 g/dL (ref 3.5–5.0)
Alkaline Phosphatase: 64 U/L (ref 38–126)
Anion gap: 13 (ref 5–15)
BUN: 23 mg/dL — AB (ref 6–20)
CHLORIDE: 102 mmol/L (ref 101–111)
CO2: 24 mmol/L (ref 22–32)
CREATININE: 5.4 mg/dL — AB (ref 0.61–1.24)
Calcium: 9.6 mg/dL (ref 8.9–10.3)
GFR calc non Af Amer: 9 mL/min — ABNORMAL LOW (ref >60)
GFR, EST AFRICAN AMERICAN: 10 mL/min — AB (ref >60)
Glucose, Bld: 108 mg/dL — ABNORMAL HIGH (ref 65–99)
POTASSIUM: 4.3 mmol/L (ref 3.5–5.1)
SODIUM: 139 mmol/L (ref 135–145)
Total Bilirubin: 0.5 mg/dL (ref 0.3–1.2)
Total Protein: 7.3 g/dL (ref 6.5–8.1)

## 2014-12-06 LAB — URINALYSIS, ROUTINE W REFLEX MICROSCOPIC
BILIRUBIN URINE: NEGATIVE
GLUCOSE, UA: 100 mg/dL — AB
HGB URINE DIPSTICK: NEGATIVE
KETONES UR: NEGATIVE mg/dL
LEUKOCYTES UA: NEGATIVE
Nitrite: NEGATIVE
PH: 8.5 — AB (ref 5.0–8.0)
Specific Gravity, Urine: 1.011 (ref 1.005–1.030)

## 2014-12-06 LAB — CBC
HCT: 33.8 % — ABNORMAL LOW (ref 39.0–52.0)
Hemoglobin: 10.7 g/dL — ABNORMAL LOW (ref 13.0–17.0)
MCH: 32.8 pg (ref 26.0–34.0)
MCHC: 31.7 g/dL (ref 30.0–36.0)
MCV: 103.7 fL — ABNORMAL HIGH (ref 78.0–100.0)
PLATELETS: 197 10*3/uL (ref 150–400)
RBC: 3.26 MIL/uL — AB (ref 4.22–5.81)
RDW: 16.8 % — AB (ref 11.5–15.5)
WBC: 7.4 10*3/uL (ref 4.0–10.5)

## 2014-12-06 LAB — BLOOD GAS, ARTERIAL
ACID-BASE EXCESS: 5.3 mmol/L — AB (ref 0.0–2.0)
Bicarbonate: 27.9 mEq/L — ABNORMAL HIGH (ref 20.0–24.0)
DRAWN BY: 449841
FIO2: 0.21
O2 SAT: 98.2 %
Patient temperature: 98.6
TCO2: 28.9 mmol/L (ref 0–100)
pCO2 arterial: 31.5 mmHg — ABNORMAL LOW (ref 35.0–45.0)
pH, Arterial: 7.556 — ABNORMAL HIGH (ref 7.350–7.450)
pO2, Arterial: 92.9 mmHg (ref 80.0–100.0)

## 2014-12-06 LAB — URINE MICROSCOPIC-ADD ON

## 2014-12-06 LAB — ABO/RH: ABO/RH(D): A POS

## 2014-12-06 LAB — PROTIME-INR
INR: 1.1 (ref 0.00–1.49)
PROTHROMBIN TIME: 14.4 s (ref 11.6–15.2)

## 2014-12-06 LAB — APTT: APTT: 29 s (ref 24–37)

## 2014-12-06 LAB — SURGICAL PCR SCREEN
MRSA, PCR: NEGATIVE
STAPHYLOCOCCUS AUREUS: NEGATIVE

## 2014-12-06 MED ORDER — ALBUTEROL SULFATE (2.5 MG/3ML) 0.083% IN NEBU
2.5000 mg | INHALATION_SOLUTION | Freq: Once | RESPIRATORY_TRACT | Status: AC
  Administered 2014-12-06: 2.5 mg via RESPIRATORY_TRACT

## 2014-12-06 NOTE — Progress Notes (Signed)
Nurse called Thurmond Butts, RN at Dr. Vivi Martens office and left a voicemail instructing her to review labs that were obtained today. Direct call back number left.

## 2014-12-06 NOTE — Progress Notes (Signed)
PCP is Hollace Kinnier, DO  Nephrologist is Jeneen Rinks Deterding  Patient denied having any acute cardiac or pulmonary issues

## 2014-12-07 DIAGNOSIS — N186 End stage renal disease: Secondary | ICD-10-CM | POA: Diagnosis not present

## 2014-12-07 DIAGNOSIS — N184 Chronic kidney disease, stage 4 (severe): Secondary | ICD-10-CM | POA: Diagnosis not present

## 2014-12-07 DIAGNOSIS — D509 Iron deficiency anemia, unspecified: Secondary | ICD-10-CM | POA: Diagnosis not present

## 2014-12-07 DIAGNOSIS — N2581 Secondary hyperparathyroidism of renal origin: Secondary | ICD-10-CM | POA: Diagnosis not present

## 2014-12-07 DIAGNOSIS — D631 Anemia in chronic kidney disease: Secondary | ICD-10-CM | POA: Diagnosis not present

## 2014-12-07 LAB — HEMOGLOBIN A1C
Hgb A1c MFr Bld: 5.2 % (ref 4.8–5.6)
Mean Plasma Glucose: 103 mg/dL

## 2014-12-08 MED ORDER — CEFUROXIME SODIUM 750 MG IJ SOLR
750.0000 mg | INTRAMUSCULAR | Status: DC
  Filled 2014-12-08: qty 750

## 2014-12-08 MED ORDER — NITROGLYCERIN IN D5W 200-5 MCG/ML-% IV SOLN
2.0000 ug/min | INTRAVENOUS | Status: DC
  Filled 2014-12-08: qty 250

## 2014-12-08 MED ORDER — DEXTROSE 5 % IV SOLN
1.5000 g | INTRAVENOUS | Status: AC
  Administered 2014-12-09: 1.5 g via INTRAVENOUS
  Administered 2014-12-09: .75 g via INTRAVENOUS
  Filled 2014-12-08 (×2): qty 1.5

## 2014-12-08 MED ORDER — VANCOMYCIN HCL 10 G IV SOLR
1250.0000 mg | INTRAVENOUS | Status: AC
  Administered 2014-12-09: 1250 mg via INTRAVENOUS
  Filled 2014-12-08: qty 1250

## 2014-12-08 MED ORDER — DOPAMINE-DEXTROSE 3.2-5 MG/ML-% IV SOLN
0.0000 ug/kg/min | INTRAVENOUS | Status: DC
  Filled 2014-12-08: qty 250

## 2014-12-08 MED ORDER — MAGNESIUM SULFATE 50 % IJ SOLN
40.0000 meq | INTRAMUSCULAR | Status: DC
Start: 2014-12-09 — End: 2014-12-09
  Filled 2014-12-08: qty 10

## 2014-12-08 MED ORDER — SODIUM CHLORIDE 0.9 % IV SOLN
INTRAVENOUS | Status: AC
  Administered 2014-12-09: 2 [IU]/h via INTRAVENOUS
  Filled 2014-12-08: qty 2.5

## 2014-12-08 MED ORDER — METOPROLOL TARTRATE 12.5 MG HALF TABLET
12.5000 mg | ORAL_TABLET | Freq: Once | ORAL | Status: AC
  Administered 2014-12-09: 12.5 mg via ORAL
  Filled 2014-12-08: qty 1

## 2014-12-08 MED ORDER — SODIUM CHLORIDE 0.9 % IV SOLN
INTRAVENOUS | Status: AC
  Administered 2014-12-09: 70 mL/h via INTRAVENOUS
  Filled 2014-12-08: qty 40

## 2014-12-08 MED ORDER — CHLORHEXIDINE GLUCONATE 0.12 % MT SOLN
15.0000 mL | Freq: Once | OROMUCOSAL | Status: AC
  Administered 2014-12-09: 15 mL via OROMUCOSAL
  Filled 2014-12-08 (×2): qty 15

## 2014-12-08 MED ORDER — DEXMEDETOMIDINE HCL IN NACL 400 MCG/100ML IV SOLN
0.1000 ug/kg/h | INTRAVENOUS | Status: AC
  Administered 2014-12-09: .3 ug/kg/h via INTRAVENOUS
  Filled 2014-12-08 (×2): qty 100

## 2014-12-08 MED ORDER — PLASMA-LYTE 148 IV SOLN
INTRAVENOUS | Status: AC
  Administered 2014-12-09: 500 mL
  Filled 2014-12-08: qty 2.5

## 2014-12-08 MED ORDER — PHENYLEPHRINE HCL 10 MG/ML IJ SOLN
30.0000 ug/min | INTRAMUSCULAR | Status: AC
  Administered 2014-12-09: 20 ug/min via INTRAVENOUS
  Filled 2014-12-08: qty 2

## 2014-12-08 MED ORDER — SODIUM CHLORIDE 0.9 % IV SOLN
INTRAVENOUS | Status: DC
  Filled 2014-12-08: qty 30

## 2014-12-08 MED ORDER — EPINEPHRINE HCL 1 MG/ML IJ SOLN
0.0000 ug/min | INTRAVENOUS | Status: DC
  Filled 2014-12-08: qty 4

## 2014-12-08 MED ORDER — POTASSIUM CHLORIDE 2 MEQ/ML IV SOLN
80.0000 meq | INTRAVENOUS | Status: DC
  Filled 2014-12-08: qty 40

## 2014-12-08 NOTE — H&P (Signed)
[address]       [place] [address]             [phone]      Cardiothoracic Surgery History and Physical   PCP is REED, TIFFANY, DO Referring Provider is Gayland Curry, DO  Chief Complaint  Patient presents with  . Coronary Artery Disease      . Aortic Stenosis  .         HPI:  The patient is an 81 year old gentleman with HTN and ESRD on HD who reports progressive exertional fatigue and shortness of breath over the past year or so. He had remained fairly active despite being on dialysis for the past year. He had a dialysis catheter in for a while and could not exercise while that was in. It was removed earlier this month and when he tried to exercise he developed shortness of breath, fatigue and chest tightness quickly. He had an ECG that showed new inferior changes and was referred to cardiology. Cardiac cath on 11/27/2014 showed severe 3-vessel coronary disease. The proximal and mid LAD were heavily calcified with 80% proximal and 99% mid LAD stenosis followed by a 90% mid stenosis. The LCX had a 60% stenosis in a large OM2 and an occluded OM3 that long but small in diameter or underfilled by the collat from the LAD. The RCA is occluded distally with faint filling of a small diameter PDA branch by collat from the LAD. He also had a heart murmur on exam and an echo showed at least moderate AS with a mean gradient of 36 mm Hg and a DI of 0.21. LVEF was reduced to 35% with anterior, septal and inferior wall motion abnormalities.   He moved here from [place] and lives with his wife at [facility]. He has a daughter who lives locally and two other children who live in other places but are in on a conference call with [place] today. His daughter reports that her mother is showing signs of some dementia with memory loss and repeating things frequently. They have both lived independently.  Past Medical History  Diagnosis Date  . CKD (chronic  kidney disease) stage 5, GFR less than 15 ml/min (HCC)   . Unspecified essential hypertension   . GERD (gastroesophageal reflux disease)   . Gout   . Herpes zoster 04/2012  . Heart murmur     as a teen  . Shortness of breath dyspnea   . Anxiety   . Arthritis   . Complication of anesthesia     " one time my heart stopped due to a medication that I was on."     Past Surgical History  Procedure Laterality Date  . Appendectomy  1944  . Cataract extraction w/ intraocular lens implant Bilateral 2014    [place]  . Dialysis fistula creation  08/04/2012 and 10/13    Dr. Harden Mo  . Colonoscopy  2013    Dr. Annamaria Helling [place], [place].  Marland Kitchen Av fistula placement Left     Left Cimino AVF placed in [place]   . Insertion of dialysis catheter Right 01-15-14    Right chest TDC placed by Dr. Augustin Coupe at [facility]  . Eye surgery      Cataract  . Av fistula placement Right 03/06/2014    Procedure: ARTERIOVENOUS (AV) FISTULA CREATION-right radiocephalic; Surgeon: Mal Misty, MD; Location: [facility]; Service: Vascular; Laterality: Right;  .  Ligation of competing branches of arteriovenous fistula Right 05/08/2014    Procedure: LIGATION OF COMPETING BRANCHES OF RIGHT ARM RADIOCEPHALIC ARTERIOVENOUS FISTULA; Surgeon: Mal Misty, MD; Location: [place]; Service: Vascular; Laterality: Right;  . Av fistula placement Right 07/15/2014    Procedure: ARTERIOVENOUS (AV) FISTULA CREATION; Surgeon: Elam Dutch, MD; Location: [facility]; Service: Vascular; Laterality: Right;  . Ligation of arteriovenous fistula Right 07/15/2014    Procedure: LIGATION OF ARTERIOVENOUS FISTULA (RIGHT RADIOCEPHALIC); Surgeon: Elam Dutch, MD; Location: [place]; Service: Vascular; Laterality: Right;  . Revison of arteriovenous fistula Right 07/15/2014    Procedure: EXPLORATION OF  ARTERIOVENOUS FISTULA; Surgeon: Elam Dutch, MD; Location: [place]; Service: Vascular; Laterality: Right;  . Cardiac catheterization N/A 11/27/2014    Procedure: Right/Left Heart Cath and Coronary Angiography; Surgeon: Burnell Blanks, MD; Location: [facility]; Service: Cardiovascular; Laterality: N/A;    Family History  Problem Relation Age of Onset  . Cancer Father     lung  . Cancer Mother     Social History Social History  Substance Use Topics  . Smoking status: Never Smoker   . Smokeless tobacco: Never Used  . Alcohol Use: 1.2 oz/week    1 Glasses of wine, 1 Shots of liquor per week     Comment: one glass a day of either wine or liquor    Current Outpatient Prescriptions  Medication Sig Dispense Refill  . allopurinol (ZYLOPRIM) 100 MG tablet Take 1 tablet (100 mg total) by mouth daily. 90 tablet 3  . amLODipine (NORVASC) 10 MG tablet Take one tablet by mouth once daily to control blood pressure 90 tablet 3  . aspirin EC 81 MG tablet Take 81 mg by mouth at bedtime.     Marland Kitchen atorvastatin (LIPITOR) 20 MG tablet Take one tablet by mouth once daily for cholesterol 90 tablet 1  . multivitamin (RENA-VIT) TABS tablet Take 1 tablet by mouth daily.     Marland Kitchen omeprazole (PRILOSEC) 20 MG capsule Take 20 mg by mouth 4 (four) times a week. Sunday, Tuesday, Thursday and Saturday    . Vitamin D, Ergocalciferol, (DRISDOL) 50000 UNITS CAPS capsule Take 1 capsule (50,000 Units total) by mouth every 14 (fourteen) days. 1st and 15th of each month 24 capsule 0   No current facility-administered medications for this visit.    No Known Allergies  Review of Systems  Constitutional: Positive for activity change and fatigue. Negative for appetite change and unexpected weight change.  HENT: Negative.  Eyes: Negative.  Respiratory: Positive for chest tightness and shortness of breath.    Cardiovascular: Negative for chest pain, palpitations and leg swelling.  Gastrointestinal: Negative.  Endocrine: Negative.  Genitourinary: Negative.  Musculoskeletal: Negative.  Skin: Negative.  Allergic/Immunologic: Negative.  Neurological: Negative for dizziness and syncope.  Hematological: Negative.  Psychiatric/Behavioral: Negative.    BP 109/64 mmHg  Pulse 100  Resp 16  Ht 5\' 10"  (1.778 m)  Wt 172 lb (78.019 kg)  BMI 24.68 kg/m2  SpO2 98% Physical Exam  Constitutional: He is oriented to person, place, and time. He appears well-developed and well-nourished. No distress.  HENT:  Head: Normocephalic and atraumatic.  Mouth/Throat: Oropharynx is clear and moist.  Eyes: EOM are normal. Pupils are equal, round, and reactive to light.  Neck: Normal range of motion. Neck supple. No thyromegaly present.  Cardiovascular: Normal rate, regular rhythm and intact distal pulses.  Murmur heard. 3/6 systolic murmur along RSB  Pulmonary/Chest: Effort normal and breath sounds normal. No respiratory  distress. He has no wheezes. He has no rales. He exhibits no tenderness.  Abdominal: Soft. Bowel sounds are normal. He exhibits no distension and no mass. There is no tenderness.  Musculoskeletal: Normal range of motion. He exhibits no edema or tenderness.  Right upper arm AV fistula  Lymphadenopathy:   He has no cervical adenopathy.  Neurological: He is alert and oriented to person, place, and time. He has normal strength. No cranial nerve deficit or sensory deficit.  Skin: Skin is warm and dry.  Psychiatric: He has a normal mood and affect.     Diagnostic Tests:       [facility]            [address], [place] [address]              [phone]  ------------------------------------------------------------------- Echocardiography  Patient:  Jake, Irigoyen MR #:    [id number] Study Date:  11/25/2014 Gender:   M Age:    19 Height:   177.8 cm Weight:   78 kg BSA:    1.97 m^2 Pt. Status: Room:  ATTENDING  Fransico Him, MD SONOGRAPHER Victorio Palm, RDCS ORDERING   Burtis Junes PERFORMING  [facility], Outpatient  cc:  ------------------------------------------------------------------- LV EF: 35% -  40%  ------------------------------------------------------------------- Indications:   Aortic stenosis (I35.0). SOB (R06.09). Abnormal EKG (R94.31).  ------------------------------------------------------------------- History:  PMH: Acquired from the patient and from the patient&'s chart. Dyspnea and murmur. Dialysis-dependent chronic renal failure. Risk factors: Hypertension.  ------------------------------------------------------------------- Study Conclusions  - Left ventricle: The cavity size was normal. There was moderate concentric hypertrophy. Systolic function was moderately reduced. The estimated ejection fraction was in the range of 35% to 40%. There is akinesis of the mid-apicalinferoseptal myocardium. There is akinesis of the entireapical myocardium. There is akinesis of the apicallateral myocardium. There is akinesis of the basalinferior myocardium. There is akinesis of the apicalanterior, inferolateral, and inferior myocardium. There is akinesis of the midanteroseptal myocardium. There was an increased relative contribution of atrial contraction to ventricular filling. Doppler parameters are consistent with abnormal left ventricular relaxation (grade 1 diastolic dysfunction). - Aortic valve: The degree of aortic stenosis may be underestimated due to reduced LVF. Suggest dobutamine echo to assess change in mean AV gradient and AVA. Severe thickening and calcification. Valve mobility was restricted. There was moderate stenosis. There was mild regurgitation. Mean gradient (S): 36 mm Hg.  Peak gradient (S): 63 mm Hg. Valve area (VTI): 1.85 cm^2. Valve area (Vmax): 1.29 cm^2. Valve area (Vmean): 1.42 cm^2. - Aorta: Ascending aortic diameter: 38 mm (S). - Ascending aorta: The ascending aorta was mildly dilated. - Mitral valve: Calcified annulus. There was mild regurgitation. - Atrial septum: There was increased thickness of the septum, consistent with lipomatous hypertrophy. - Tricuspid valve: There was trivial regurgitation. - Pulmonic valve: There was trivial regurgitation. - Pulmonary arteries: PA peak pressure: 38 mm Hg (S).  Impressions:  - The right ventricular systolic pressure was increased consistent with mild pulmonary hypertension.  ------------------------------------------------------------------- Labs, prior tests, procedures, and surgery: ECG.   Abnormal. Echocardiography. M-mode, complete 2D, spectral Doppler, and color Doppler. Birthdate: Patient birthdate: [birth year]. Age: Patient is 81 yr old. Sex: Gender: male.  BMI: 24.7 kg/m^2. Blood pressure:   128/72 Patient status: Outpatient. Study date: Study date: 11/25/2014. Study time: 10:08 AM. Location: [facility]  -------------------------------------------------------------------  ------------------------------------------------------------------- Left ventricle: The cavity size was normal. There was moderate concentric  hypertrophy. Systolic function was moderately reduced. The estimated ejection fraction was in the range of 35% to 40%. Regional wall motion abnormalities:  There is akinesis of the mid-apicalinferoseptal myocardium. There is akinesis of the entireapical myocardium. There is akinesis of the apicallateral myocardium. There is akinesis of the basalinferior myocardium. There is akinesis of the apicalanterior, inferolateral, and inferior myocardium. There is akinesis of the midanteroseptal myocardium. There was an increased relative contribution  of atrial contraction to ventricular filling. Doppler parameters are consistent with abnormal left ventricular relaxation (grade 1 diastolic dysfunction).  ------------------------------------------------------------------- Aortic valve: The degree of aortic stenosis may be underestimated due to reduced LVF. Suggest dobutamine echo to assess change in mean AV gradient and AVA. Trileaflet. Severe thickening and calcification. Valve mobility was restricted. Doppler:  There was moderate stenosis.  There was mild regurgitation.  VTI ratio of LVOT to aortic valve: 0.3. Valve area (VTI): 1.85 cm^2. Indexed valve area (VTI): 0.94 cm^2/m^2. Peak velocity ratio of LVOT to aortic valve: 0.21. Valve area (Vmax): 1.29 cm^2. Indexed valve area (Vmax): 0.65 cm^2/m^2. Mean velocity ratio of LVOT to aortic valve: 0.23. Valve area (Vmean): 1.42 cm^2. Indexed valve area (Vmean): 0.72 cm^2/m^2.  Mean gradient (S): 36 mm Hg. Peak gradient (S): 63 mm Hg.  ------------------------------------------------------------------- Aorta: Aortic root: The aortic root was normal in size. Ascending aorta: The ascending aorta was mildly dilated.  ------------------------------------------------------------------- Mitral valve:  Calcified annulus. Mobility was not restricted. Doppler: Transvalvular velocity was within the normal range. There was no evidence for stenosis. There was mild regurgitation.  ------------------------------------------------------------------- Left atrium: The atrium was normal in size.  ------------------------------------------------------------------- Atrial septum: There was increased thickness of the septum, consistent with lipomatous hypertrophy.  ------------------------------------------------------------------- Right ventricle: The cavity size was normal. Wall thickness was normal. Systolic function was  normal.  ------------------------------------------------------------------- Pulmonic valve:  Structurally normal valve.  Cusp separation was normal. Doppler: Transvalvular velocity was within the normal range. There was no evidence for stenosis. There was trivial regurgitation.  ------------------------------------------------------------------- Tricuspid valve:  Structurally normal valve.  Doppler: Transvalvular velocity was within the normal range. There was trivial regurgitation.  ------------------------------------------------------------------- Pulmonary artery:  The main pulmonary artery was normal-sized. Systolic pressure was within the normal range.  ------------------------------------------------------------------- Right atrium: The atrium was normal in size.  ------------------------------------------------------------------- Pericardium: There was no pericardial effusion.  ------------------------------------------------------------------- Systemic veins: Inferior vena cava: The vessel was normal in size.  ------------------------------------------------------------------- Measurements  Left ventricle              Value     Reference LV ID, ED, PLAX chordal          46.1 mm    43 - 52 LV ID, ES, PLAX chordal          26.9 mm    23 - 38 LV fx shortening, PLAX chordal      42  %    >=29 LV PW thickness, ED            13.6 mm    --------- IVS/LV PW ratio, ED            1.01      <=1.3 Stroke volume, 2D             109  ml    --------- Stroke volume/bsa, 2D           55  ml/m^2  --------- LV e&', lateral              4.68 cm/s   --------- LV E/e&',  lateral             14.91     --------- LV e&', medial               6.85 cm/s   --------- LV E/e&', medial               10.19     --------- LV e&', average              5.77 cm/s   --------- LV E/e&', average             12.11     ---------  Ventricular septum            Value     Reference IVS thickness, ED             13.7 mm    ---------  LVOT                   Value     Reference LVOT ID, S                28  mm    --------- LVOT area                 6.16 cm^2   --------- LVOT peak velocity, S           82.8 cm/s   --------- LVOT mean velocity, S           64.5 cm/s   --------- LVOT VTI, S                28  cm    ---------  Aortic valve               Value     Reference Aortic valve peak velocity, S       396  cm/s   --------- Aortic valve mean velocity, S       280  cm/s   --------- Aortic valve VTI, S            93.3 cm    --------- Aortic mean gradient, S          36  mm Hg  --------- Aortic peak gradient, S          63  mm Hg  --------- VTI ratio, LVOT/AV            0.3      --------- Aortic valve area, VTI          1.85 cm^2   --------- Aortic valve area/bsa, VTI        0.94 cm^2/m^2 --------- Velocity ratio, peak, LVOT/AV       0.21      --------- Aortic valve area, peak velocity     1.29 cm^2   --------- Aortic valve area/bsa, peak        0.65 cm^2/m^2 --------- velocity Velocity ratio, mean, LVOT/AV       0.23      --------- Aortic valve area, mean velocity     1.42 cm^2   --------- Aortic valve area/bsa, mean        0.72 cm^2/m^2 --------- velocity  Aorta                   Value     Reference Aortic root ID, ED            30  mm    --------- Ascending aorta ID,  A-P, S  38  mm    ---------  Left atrium                Value     Reference LA ID, A-P, ES              43  mm    --------- LA ID/bsa, A-P              2.18 cm/m^2  <=2.2 LA volume, S               47.5 ml    --------- LA volume/bsa, S             24.1 ml/m^2  --------- LA volume, ES, 1-p A4C          43.1 ml    --------- LA volume/bsa, ES, 1-p A4C        21.9 ml/m^2  --------- LA volume, ES, 1-p A2C          44.1 ml    --------- LA volume/bsa, ES, 1-p A2C        22.4 ml/m^2  ---------  Mitral valve               Value     Reference Mitral E-wave peak velocity        69.8 cm/s   --------- Mitral A-wave peak velocity        98  cm/s   --------- Mitral deceleration time         208  ms    150 - 230 Mitral E/A ratio, peak          0.7      ---------  Pulmonary arteries            Value     Reference PA pressure, S, DP        (H)   38  mm Hg  <=30  Tricuspid valve              Value     Reference Tricuspid regurg peak velocity      273  cm/s   --------- Tricuspid peak RV-RA gradient       30  mm Hg  ---------  Systemic veins              Value     Reference Estimated CVP               8   mm Hg  ---------  Right ventricle              Value     Reference TAPSE                   34  mm    --------- RV pressure, S, DP        (H)   38  mm Hg  <=30 RV s&', lateral, S             19  cm/s   ---------  Legend: (L) and (H) mark values outside specified reference range.  ------------------------------------------------------------------- Prepared and  Electronically Authenticated by  Fransico Him, MD 2016-11-21T13:36:14   Burnell Blanks, MD (Primary)      Procedures    Right/Left Heart Cath and Coronary Angiography    Conclusion     Mid LAD lesion, 99% stenosed.  Dist LAD lesion, 90% stenosed.  Ost 2nd Diag lesion, 90% stenosed.  Prox LAD to Mid LAD lesion, 80% stenosed.  2nd Mrg lesion, 60% stenosed.  Ost 3rd Mrg lesion, 100% stenosed.  Prox RCA lesion, 95% stenosed.  Mid RCA lesion, 99% stenosed.  Dist RCA lesion, 100% stenosed.  1. Severe triple vessel CAD 2. Severe stenosis in the heavily calcified mid LAD  3. Chronic occlusion OM branch 4. Chronic occlusion distal RCA 5. Ischemic cardiomyopathy with normal filling pressures.  6. Likely moderately severe AS by echo (based on appearance of the valve with restricted leaflet motion, mean gradient over 40mmHg).  Recommendations: His CAD is diffuse and not favorable for PCI. He is a very active 81 yo man and despite ESRD on HD, he is functional. I will refer to CT surgery to discuss CABG with combined AVR.      Indications    Coronary artery disease involving native coronary artery of native heart with unstable angina pectoris (HCC) [I25.110 (ICD-10-CM)]   Aortic stenosis [I35.0 (ICD-10-CM)]    Technique and Indications    Estimated blood loss <50 mL. Indication: Dyspnea concerning for unstable angina. Echo with moderate reduction LV systolic function. EKG with diffuse abnormalities.   Procedure: The risks, benefits, complications, treatment options, and expected outcomes were discussed with the patient. The patient and/or family concurred with the proposed plan, giving informed consent. The patient was brought to the cath lab after IV hydration was begun and oral premedication was given. The patient was further sedated with Versed and Fentanyl. The right groin was prepped and draped in the usual manner. Using the modified  Seldinger access technique, a 5 French sheath was placed in the right femoral artery. A 7 French sheath was placed in the right femoral vein. A balloon tipped catheter was used to perform a right heart catheterization. Standard diagnostic catheters were used to perform selective coronary angiography. An AL-1 catheter was used to cross the aortic valve. LV pressures measured. No LV gram was performed.   There were no immediate complications. The patient was taken to the recovery area in stable condition.    Coronary Findings    Dominance: Right   Left Anterior Descending   . Prox LAD to Mid LAD lesion, 80% stenosed. Calcified diffuse.   . Mid LAD lesion, 99% stenosed. Discrete.   Jorene Minors LAD lesion, 90% stenosed. Discrete.   . First Diagonal Branch   The vessel is small in size.   Marland Kitchen Second Diagonal Branch   The vessel is small in size.   Colon Flattery 2nd Diag lesion, 90% stenosed. Discrete.     Ramus Intermedius  . Vessel is moderate in size. There is mild the vessel.     Left Circumflex   . Second Obtuse Marginal Branch   The vessel is moderate in size.   . 2nd Mrg lesion, 60% stenosed. Diffuse.   . Third Obtuse Marginal Branch   The vessel is moderate in size. 3rd Mrg filled by collaterals from Dist LAD.   Colon Flattery 3rd Mrg lesion, 100% stenosed. Discrete chronic total occlusion.     Right Coronary Artery   . Prox RCA lesion, 95% stenosed. Discrete.   . Mid RCA lesion, 99% stenosed. Diffuse.   . Dist RCA lesion, 100% stenosed. Chronic total occlusion.   . Right Posterior Descending Artery   RPDA filled by collaterals from Dist LAD.   . Right Posterior Atrioventricular Branch   Post Atrio filled by collaterals from Acute Mrg.       Right Heart Pressures LV EDP is normal.    Left Heart    Aortic Valve There is moderate aortic valve  stenosis.    Coronary Diagrams    Diagnostic Diagram             Implants    Name ID Temporary Type Supply   No information to display    PACS Images    Show images for Cardiac catheterization     Link to Procedure Log    Procedure Log      Hemo Data       Most Recent Value   Fick Cardiac Output  5.49 L/min   Fick Cardiac Output Index  2.8 (L/min)/BSA   Aortic Mean Gradient  11.1 mmHg   Aortic Peak Gradient  10 mmHg   Aortic Valve Area  1.94   Aortic Value Area Index  0.99 cm2/BSA   RA A Wave  10 mmHg   RA V Wave  7 mmHg   RA Mean  7 mmHg   RV Systolic Pressure  33 mmHg   RV Diastolic Pressure  3 mmHg   RV EDP  9 mmHg   PA Systolic Pressure  32 mmHg   PA Diastolic Pressure  12 mmHg   PA Mean  20 mmHg   PW A Wave  16 mmHg   PW V Wave  14 mmHg   PW Mean  11 mmHg   AO Systolic Pressure  [id number] mmHg   AO Diastolic Pressure  61 mmHg   AO Mean  85 mmHg   LV Systolic Pressure  [id number] mmHg   LV Diastolic Pressure  10 mmHg   LV EDP  18 mmHg   Arterial Occlusion Pressure Extended Systolic Pressure  [id number] mmHg   Arterial Occlusion Pressure Extended Diastolic Pressure  59 mmHg   Arterial Occlusion Pressure Extended Mean Pressure  85 mmHg   Left Ventricular Apex Extended Systolic Pressure  0000000 mmHg   Left Ventricular Apex Extended Diastolic Pressure  10 mmHg   Left Ventricular Apex Extended EDP Pressure  17 mmHg   QP/QS  1   TPVR Index  7.14 HRUI   TSVR Index  30.35 HRUI   PVR SVR Ratio  0.12   TPVR/TSVR Ratio  0.24     Impression:  He has severe 3- vessel coronary artery disease with a diffusely calcified proximal to mid LAD with multiple high grade stenoses in this area. The distal vessel looks like a reasonable vessel for grafting and supplies collaterals to the occluded OM3 and PDA. The OM and PDA look fairly small  in diameter but may just be under-filled. There is also at least moderate and probably severe symptomatic aortic stenosis. I have personally reviewed his echo and the aortic valve is trileaflet with heavy leaflet calcification and reduced mobility. The mean gradient is 36 and the DI is 0.21 with a reduced EF of 35-40% so he may have low EF, lower gradient severe AS. I agree with Dr. Angelena Form that CABG and AVR is the best treatment for him despite his age and end stage renal failure. He is in fairly good condition for his age and still trying to remain as active as possible, limited by chronic systolic and diastolic heart failure with exertional fatigue and shortness of breath. I discussed the operative procedure with the patient and family including alternatives, benefits and risks; including but not limited to bleeding, blood transfusion, infection, stroke, myocardial infarction, graft failure, heart block requiring a permanent pacemaker, organ dysfunction, and death. [name] understands and agrees to proceed.   Plan:  CABG and AVR using a tissue valve on  Monday 12/09/2014.  Gaye Pollack, MD [facility] [phone]

## 2014-12-08 NOTE — Anesthesia Preprocedure Evaluation (Addendum)
Anesthesia Evaluation  Patient identified by MRN, date of birth, ID band Patient awake    Reviewed: Allergy & Precautions, NPO status   History of Anesthesia Complications Negative for: history of anesthetic complications  Airway Mallampati: II  TM Distance: >3 FB Neck ROM: Full    Dental  (+) Teeth Intact, Dental Advisory Given   Pulmonary neg pulmonary ROS,    Pulmonary exam normal        Cardiovascular hypertension, + angina + CAD  + Valvular Problems/Murmurs AS  Rhythm:Regular + Systolic murmurs 1. Severe triple vessel CAD 2. Severe stenosis in the heavily calcified mid LAD  3. Chronic occlusion OM branch 4. Chronic occlusion distal RCA 5. Ischemic cardiomyopathy with normal filling pressures.  6. Likely moderately severe AS by echo (based on appearance of the valve with restricted leaflet motion, mean gradient over 59mmHg). Left ventricle: The cavity size was normal. There was moderate concentric hypertrophy. Systolic function was moderately reduced. The estimated ejection fraction was in the range of 35% to 40%. There is akinesis of the mid-apicalinferoseptal myocardium. There is akinesis of the entireapical myocardium. There is akinesis of the apicallateral myocardium. There is akinesis of the basalinferior myocardium. There is akinesis of the apicalanterior, inferolateral, and inferior myocardium. There is akinesis of the midanteroseptal myocardium.    Neuro/Psych PSYCHIATRIC DISORDERS Anxiety negative neurological ROS     GI/Hepatic Neg liver ROS, GERD  ,  Endo/Other    Renal/GU ESRF and DialysisRenal disease     Musculoskeletal   Abdominal   Peds  Hematology   Anesthesia Other Findings   Reproductive/Obstetrics                            Anesthesia Physical Anesthesia Plan  ASA: IV  Anesthesia Plan: General   Post-op Pain Management:    Induction:  Intravenous  Airway Management Planned: Oral ETT  Additional Equipment: Arterial line, PA Cath and 3D TEE  Intra-op Plan:   Post-operative Plan: Post-operative intubation/ventilation  Informed Consent: I have reviewed the patients History and Physical, chart, labs and discussed the procedure including the risks, benefits and alternatives for the proposed anesthesia with the patient or authorized representative who has indicated his/her understanding and acceptance.   Dental advisory given  Plan Discussed with: CRNA, Anesthesiologist and Surgeon  Anesthesia Plan Comments:        Anesthesia Quick Evaluation

## 2014-12-09 ENCOUNTER — Inpatient Hospital Stay (HOSPITAL_COMMUNITY)
Admission: RE | Admit: 2014-12-09 | Discharge: 2014-12-16 | DRG: 219 | Disposition: A | Discharge status: Skilled Nursing Facility | Payer: Medicare Other | Source: Ambulatory Visit | Attending: Surgery | Admitting: Surgery

## 2014-12-09 ENCOUNTER — Inpatient Hospital Stay (HOSPITAL_COMMUNITY): Payer: Medicare Other | Admitting: Anesthesiology

## 2014-12-09 ENCOUNTER — Encounter (HOSPITAL_COMMUNITY): Payer: Self-pay | Admitting: *Deleted

## 2014-12-09 ENCOUNTER — Inpatient Hospital Stay (HOSPITAL_COMMUNITY): Payer: Medicare Other

## 2014-12-09 ENCOUNTER — Encounter (HOSPITAL_COMMUNITY)
Admission: RE | Disposition: A | Discharge status: Skilled Nursing Facility | Payer: Medicare Other | Source: Ambulatory Visit | Attending: Surgery

## 2014-12-09 DIAGNOSIS — Z952 Presence of prosthetic heart valve: Secondary | ICD-10-CM

## 2014-12-09 DIAGNOSIS — M109 Gout, unspecified: Secondary | ICD-10-CM | POA: Diagnosis present

## 2014-12-09 DIAGNOSIS — N2581 Secondary hyperparathyroidism of renal origin: Secondary | ICD-10-CM | POA: Diagnosis not present

## 2014-12-09 DIAGNOSIS — Z79899 Other long term (current) drug therapy: Secondary | ICD-10-CM

## 2014-12-09 DIAGNOSIS — I25110 Atherosclerotic heart disease of native coronary artery with unstable angina pectoris: Secondary | ICD-10-CM | POA: Diagnosis present

## 2014-12-09 DIAGNOSIS — F419 Anxiety disorder, unspecified: Secondary | ICD-10-CM | POA: Diagnosis present

## 2014-12-09 DIAGNOSIS — K219 Gastro-esophageal reflux disease without esophagitis: Secondary | ICD-10-CM | POA: Diagnosis present

## 2014-12-09 DIAGNOSIS — I358 Other nonrheumatic aortic valve disorders: Secondary | ICD-10-CM | POA: Diagnosis not present

## 2014-12-09 DIAGNOSIS — E1122 Type 2 diabetes mellitus with diabetic chronic kidney disease: Secondary | ICD-10-CM | POA: Diagnosis present

## 2014-12-09 DIAGNOSIS — J90 Pleural effusion, not elsewhere classified: Secondary | ICD-10-CM | POA: Diagnosis not present

## 2014-12-09 DIAGNOSIS — I080 Rheumatic disorders of both mitral and aortic valves: Secondary | ICD-10-CM | POA: Diagnosis not present

## 2014-12-09 DIAGNOSIS — E876 Hypokalemia: Secondary | ICD-10-CM | POA: Diagnosis present

## 2014-12-09 DIAGNOSIS — D631 Anemia in chronic kidney disease: Secondary | ICD-10-CM | POA: Diagnosis present

## 2014-12-09 DIAGNOSIS — Z992 Dependence on renal dialysis: Secondary | ICD-10-CM

## 2014-12-09 DIAGNOSIS — I350 Nonrheumatic aortic (valve) stenosis: Secondary | ICD-10-CM | POA: Diagnosis not present

## 2014-12-09 DIAGNOSIS — N186 End stage renal disease: Secondary | ICD-10-CM | POA: Diagnosis not present

## 2014-12-09 DIAGNOSIS — Z951 Presence of aortocoronary bypass graft: Secondary | ICD-10-CM | POA: Diagnosis not present

## 2014-12-09 DIAGNOSIS — D62 Acute posthemorrhagic anemia: Secondary | ICD-10-CM | POA: Diagnosis not present

## 2014-12-09 DIAGNOSIS — K5900 Constipation, unspecified: Secondary | ICD-10-CM | POA: Diagnosis not present

## 2014-12-09 DIAGNOSIS — I2510 Atherosclerotic heart disease of native coronary artery without angina pectoris: Secondary | ICD-10-CM

## 2014-12-09 DIAGNOSIS — J9811 Atelectasis: Secondary | ICD-10-CM | POA: Diagnosis not present

## 2014-12-09 DIAGNOSIS — Z7982 Long term (current) use of aspirin: Secondary | ICD-10-CM | POA: Diagnosis not present

## 2014-12-09 DIAGNOSIS — I120 Hypertensive chronic kidney disease with stage 5 chronic kidney disease or end stage renal disease: Secondary | ICD-10-CM | POA: Diagnosis not present

## 2014-12-09 DIAGNOSIS — Z09 Encounter for follow-up examination after completed treatment for conditions other than malignant neoplasm: Secondary | ICD-10-CM

## 2014-12-09 HISTORY — PX: TEE WITHOUT CARDIOVERSION: SHX5443

## 2014-12-09 HISTORY — PX: AORTIC VALVE REPLACEMENT: SHX41

## 2014-12-09 HISTORY — PX: CORONARY ARTERY BYPASS GRAFT: SHX141

## 2014-12-09 LAB — POCT I-STAT 3, ART BLOOD GAS (G3+)
ACID-BASE DEFICIT: 1 mmol/L (ref 0.0–2.0)
ACID-BASE EXCESS: 4 mmol/L — AB (ref 0.0–2.0)
ACID-BASE EXCESS: 2 mmol/L (ref 0.0–2.0)
Acid-Base Excess: 5 mmol/L — ABNORMAL HIGH (ref 0.0–2.0)
Acid-Base Excess: 2 mmol/L (ref 0.0–2.0)
Acid-base deficit: 2 mmol/L (ref 0.0–2.0)
BICARBONATE: 28.4 meq/L — AB (ref 20.0–24.0)
BICARBONATE: 26.1 meq/L — AB (ref 20.0–24.0)
BICARBONATE: 21.3 meq/L (ref 20.0–24.0)
Bicarbonate: 29.9 mEq/L — ABNORMAL HIGH (ref 20.0–24.0)
Bicarbonate: 26.4 mEq/L — ABNORMAL HIGH (ref 20.0–24.0)
Bicarbonate: 22.5 mEq/L (ref 20.0–24.0)
O2 SAT: 100 %
O2 SAT: 100 %
O2 SAT: 99 %
O2 Saturation: 100 %
O2 Saturation: 96 %
O2 Saturation: 99 %
PCO2 ART: 45.3 mmHg — AB (ref 35.0–45.0)
PCO2 ART: 39.2 mmHg (ref 35.0–45.0)
PCO2 ART: 35.9 mmHg (ref 35.0–45.0)
PCO2 ART: 28.3 mmHg — AB (ref 35.0–45.0)
PH ART: 7.428 (ref 7.350–7.450)
PH ART: 7.435 (ref 7.350–7.450)
PH ART: 7.408 (ref 7.350–7.450)
PH ART: 7.488 — AB (ref 7.350–7.450)
PO2 ART: 487 mmHg — AB (ref 80.0–100.0)
PO2 ART: 205 mmHg — AB (ref 80.0–100.0)
PO2 ART: 127 mmHg — AB (ref 80.0–100.0)
PO2 ART: 128 mmHg — AB (ref 80.0–100.0)
Patient temperature: 99.5
Patient temperature: 100
TCO2: 31 mmol/L (ref 0–100)
TCO2: 28 mmol/L (ref 0–100)
TCO2: 30 mmol/L (ref 0–100)
TCO2: 27 mmol/L (ref 0–100)
TCO2: 24 mmol/L (ref 0–100)
TCO2: 22 mmol/L (ref 0–100)
pCO2 arterial: 40.1 mmHg (ref 35.0–45.0)
pCO2 arterial: 37.4 mmHg (ref 35.0–45.0)
pH, Arterial: 7.458 — ABNORMAL HIGH (ref 7.350–7.450)
pH, Arterial: 7.452 — ABNORMAL HIGH (ref 7.350–7.450)
pO2, Arterial: 355 mmHg — ABNORMAL HIGH (ref 80.0–100.0)
pO2, Arterial: 76 mmHg — ABNORMAL LOW (ref 80.0–100.0)

## 2014-12-09 LAB — PREPARE RBC (CROSSMATCH)

## 2014-12-09 LAB — POCT I-STAT, CHEM 8
BUN: 30 mg/dL — ABNORMAL HIGH (ref 6–20)
BUN: 28 mg/dL — AB (ref 6–20)
BUN: 30 mg/dL — ABNORMAL HIGH (ref 6–20)
BUN: 31 mg/dL — AB (ref 6–20)
BUN: 32 mg/dL — AB (ref 6–20)
BUN: 34 mg/dL — AB (ref 6–20)
BUN: 33 mg/dL — AB (ref 6–20)
CALCIUM ION: 1.11 mmol/L — AB (ref 1.13–1.30)
CALCIUM ION: 1.06 mmol/L — AB (ref 1.13–1.30)
CALCIUM ION: 1.14 mmol/L (ref 1.13–1.30)
CHLORIDE: 110 mmol/L (ref 101–111)
CHLORIDE: 98 mmol/L — AB (ref 101–111)
CHLORIDE: 96 mmol/L — AB (ref 101–111)
CHLORIDE: 97 mmol/L — AB (ref 101–111)
CHLORIDE: 97 mmol/L — AB (ref 101–111)
CHLORIDE: 104 mmol/L (ref 101–111)
CREATININE: 6.3 mg/dL — AB (ref 0.61–1.24)
CREATININE: 6.1 mg/dL — AB (ref 0.61–1.24)
Calcium, Ion: 1.01 mmol/L — ABNORMAL LOW (ref 1.13–1.30)
Calcium, Ion: 1.09 mmol/L — ABNORMAL LOW (ref 1.13–1.30)
Calcium, Ion: 1.02 mmol/L — ABNORMAL LOW (ref 1.13–1.30)
Calcium, Ion: 1.04 mmol/L — ABNORMAL LOW (ref 1.13–1.30)
Chloride: 107 mmol/L (ref 101–111)
Creatinine, Ser: 6.5 mg/dL — ABNORMAL HIGH (ref 0.61–1.24)
Creatinine, Ser: 5.7 mg/dL — ABNORMAL HIGH (ref 0.61–1.24)
Creatinine, Ser: 6.4 mg/dL — ABNORMAL HIGH (ref 0.61–1.24)
Creatinine, Ser: 6.4 mg/dL — ABNORMAL HIGH (ref 0.61–1.24)
Creatinine, Ser: 6.6 mg/dL — ABNORMAL HIGH (ref 0.61–1.24)
GLUCOSE: 179 mg/dL — AB (ref 65–99)
Glucose, Bld: 123 mg/dL — ABNORMAL HIGH (ref 65–99)
Glucose, Bld: 105 mg/dL — ABNORMAL HIGH (ref 65–99)
Glucose, Bld: 116 mg/dL — ABNORMAL HIGH (ref 65–99)
Glucose, Bld: 106 mg/dL — ABNORMAL HIGH (ref 65–99)
Glucose, Bld: 115 mg/dL — ABNORMAL HIGH (ref 65–99)
Glucose, Bld: 105 mg/dL — ABNORMAL HIGH (ref 65–99)
HCT: 30 % — ABNORMAL LOW (ref 39.0–52.0)
HCT: 32 % — ABNORMAL LOW (ref 39.0–52.0)
HCT: 24 % — ABNORMAL LOW (ref 39.0–52.0)
HCT: 32 % — ABNORMAL LOW (ref 39.0–52.0)
HEMATOCRIT: 26 % — AB (ref 39.0–52.0)
HEMATOCRIT: 24 % — AB (ref 39.0–52.0)
HEMATOCRIT: 26 % — AB (ref 39.0–52.0)
HEMOGLOBIN: 10.2 g/dL — AB (ref 13.0–17.0)
HEMOGLOBIN: 10.9 g/dL — AB (ref 13.0–17.0)
Hemoglobin: 8.8 g/dL — ABNORMAL LOW (ref 13.0–17.0)
Hemoglobin: 8.2 g/dL — ABNORMAL LOW (ref 13.0–17.0)
Hemoglobin: 8.2 g/dL — ABNORMAL LOW (ref 13.0–17.0)
Hemoglobin: 8.8 g/dL — ABNORMAL LOW (ref 13.0–17.0)
Hemoglobin: 10.9 g/dL — ABNORMAL LOW (ref 13.0–17.0)
POTASSIUM: 4 mmol/L (ref 3.5–5.1)
POTASSIUM: 5.1 mmol/L (ref 3.5–5.1)
POTASSIUM: 6.5 mmol/L — AB (ref 3.5–5.1)
POTASSIUM: 5.2 mmol/L — AB (ref 3.5–5.1)
POTASSIUM: 4.8 mmol/L (ref 3.5–5.1)
Potassium: 3.8 mmol/L (ref 3.5–5.1)
Potassium: 4.3 mmol/L (ref 3.5–5.1)
SODIUM: 135 mmol/L (ref 135–145)
SODIUM: 134 mmol/L — AB (ref 135–145)
SODIUM: 140 mmol/L (ref 135–145)
SODIUM: 133 mmol/L — AB (ref 135–145)
SODIUM: 135 mmol/L (ref 135–145)
SODIUM: 138 mmol/L (ref 135–145)
Sodium: 136 mmol/L (ref 135–145)
TCO2: 30 mmol/L (ref 0–100)
TCO2: 31 mmol/L (ref 0–100)
TCO2: 31 mmol/L (ref 0–100)
TCO2: 31 mmol/L (ref 0–100)
TCO2: 35 mmol/L (ref 0–100)
TCO2: 28 mmol/L (ref 0–100)
TCO2: 23 mmol/L (ref 0–100)

## 2014-12-09 LAB — POCT I-STAT 4, (NA,K, GLUC, HGB,HCT)
GLUCOSE: 117 mg/dL — AB (ref 65–99)
Glucose, Bld: 122 mg/dL — ABNORMAL HIGH (ref 65–99)
HCT: 32 % — ABNORMAL LOW (ref 39.0–52.0)
HEMATOCRIT: 36 % — AB (ref 39.0–52.0)
Hemoglobin: 12.2 g/dL — ABNORMAL LOW (ref 13.0–17.0)
Hemoglobin: 10.9 g/dL — ABNORMAL LOW (ref 13.0–17.0)
POTASSIUM: 4 mmol/L (ref 3.5–5.1)
Potassium: 4.4 mmol/L (ref 3.5–5.1)
Sodium: 140 mmol/L (ref 135–145)
Sodium: 138 mmol/L (ref 135–145)

## 2014-12-09 LAB — CBC
HEMATOCRIT: 33 % — AB (ref 39.0–52.0)
HEMATOCRIT: 33.1 % — AB (ref 39.0–52.0)
HEMOGLOBIN: 10.1 g/dL — AB (ref 13.0–17.0)
Hemoglobin: 10.2 g/dL — ABNORMAL LOW (ref 13.0–17.0)
MCH: 32.6 pg (ref 26.0–34.0)
MCH: 32.7 pg (ref 26.0–34.0)
MCHC: 30.6 g/dL (ref 30.0–36.0)
MCHC: 30.8 g/dL (ref 30.0–36.0)
MCV: 106.5 fL — ABNORMAL HIGH (ref 78.0–100.0)
MCV: 106.1 fL — AB (ref 78.0–100.0)
PLATELETS: 176 10*3/uL (ref 150–400)
Platelets: 128 10*3/uL — ABNORMAL LOW (ref 150–400)
RBC: 3.1 MIL/uL — ABNORMAL LOW (ref 4.22–5.81)
RBC: 3.12 MIL/uL — AB (ref 4.22–5.81)
RDW: 17.5 % — AB (ref 11.5–15.5)
RDW: 18 % — ABNORMAL HIGH (ref 11.5–15.5)
WBC: 7.9 10*3/uL (ref 4.0–10.5)
WBC: 16.1 10*3/uL — AB (ref 4.0–10.5)

## 2014-12-09 LAB — GLUCOSE, CAPILLARY
GLUCOSE-CAPILLARY: 109 mg/dL — AB (ref 65–99)
Glucose-Capillary: 109 mg/dL — ABNORMAL HIGH (ref 65–99)
Glucose-Capillary: 121 mg/dL — ABNORMAL HIGH (ref 65–99)

## 2014-12-09 LAB — CREATININE, SERUM
CREATININE: 6.56 mg/dL — AB (ref 0.61–1.24)
GFR, EST AFRICAN AMERICAN: 8 mL/min — AB (ref >60)
GFR, EST NON AFRICAN AMERICAN: 7 mL/min — AB (ref >60)

## 2014-12-09 LAB — HEMOGLOBIN AND HEMATOCRIT, BLOOD
HEMATOCRIT: 23.5 % — AB (ref 39.0–52.0)
Hemoglobin: 7.5 g/dL — ABNORMAL LOW (ref 13.0–17.0)

## 2014-12-09 LAB — APTT: APTT: 36 s (ref 24–37)

## 2014-12-09 LAB — MAGNESIUM: MAGNESIUM: 2.1 mg/dL (ref 1.7–2.4)

## 2014-12-09 LAB — PROTIME-INR
INR: 1.42 (ref 0.00–1.49)
PROTHROMBIN TIME: 17.4 s — AB (ref 11.6–15.2)

## 2014-12-09 LAB — PLATELET COUNT: Platelets: 129 10*3/uL — ABNORMAL LOW (ref 150–400)

## 2014-12-09 SURGERY — REPLACEMENT, AORTIC VALVE, OPEN
Anesthesia: General | Site: Chest

## 2014-12-09 MED ORDER — MIDAZOLAM HCL 5 MG/5ML IJ SOLN
INTRAMUSCULAR | Status: DC | PRN
  Administered 2014-12-09: 5 mg via INTRAVENOUS
  Administered 2014-12-09: 3 mg via INTRAVENOUS
  Administered 2014-12-09: 2 mg via INTRAVENOUS

## 2014-12-09 MED ORDER — METOPROLOL TARTRATE 25 MG/10 ML ORAL SUSPENSION: 12.5000 mg | Freq: Two times a day (BID) | ORAL | Status: DC

## 2014-12-09 MED ORDER — SODIUM CHLORIDE 0.9 % IV SOLN
INTRAVENOUS | Status: DC
  Administered 2014-12-09: 2.4 [IU]/h via INTRAVENOUS
  Filled 2014-12-09 (×2): qty 2.5

## 2014-12-09 MED ORDER — ASPIRIN 81 MG PO CHEW: 324.0000 mg | CHEWABLE_TABLET | Freq: Every day | ORAL | Status: DC

## 2014-12-09 MED ORDER — ONDANSETRON HCL 4 MG/2ML IJ SOLN: 4.0000 mg | Freq: Four times a day (QID) | INTRAMUSCULAR | Status: DC | PRN

## 2014-12-09 MED ORDER — DEXMEDETOMIDINE HCL IN NACL 200 MCG/50ML IV SOLN
0.0000 ug/kg/h | INTRAVENOUS | Status: DC
Start: 2014-12-09 — End: 2014-12-11
  Administered 2014-12-09: 0.7 ug/kg/h via INTRAVENOUS
  Filled 2014-12-09: qty 50

## 2014-12-09 MED ORDER — DEXMEDETOMIDINE HCL IN NACL 400 MCG/100ML IV SOLN
0.4000 ug/kg/h | Freq: Once | INTRAVENOUS | Status: DC
  Filled 2014-12-09: qty 100

## 2014-12-09 MED ORDER — ARTIFICIAL TEARS OP OINT
TOPICAL_OINTMENT | OPHTHALMIC | Status: DC | PRN
  Administered 2014-12-09: 1 via OPHTHALMIC

## 2014-12-09 MED ORDER — PROTAMINE SULFATE 10 MG/ML IV SOLN
INTRAVENOUS | Status: DC | PRN
  Administered 2014-12-09: 320 mg via INTRAVENOUS

## 2014-12-09 MED ORDER — PHENYLEPHRINE 40 MCG/ML (10ML) SYRINGE FOR IV PUSH (FOR BLOOD PRESSURE SUPPORT)
PREFILLED_SYRINGE | INTRAVENOUS | Status: AC
  Filled 2014-12-09: qty 10

## 2014-12-09 MED ORDER — VANCOMYCIN HCL IN DEXTROSE 1-5 GM/200ML-% IV SOLN: 1000.0000 mg | Freq: Once | INTRAVENOUS | Status: DC

## 2014-12-09 MED ORDER — SODIUM CHLORIDE 0.9 % IJ SOLN
3.0000 mL | Freq: Two times a day (BID) | INTRAMUSCULAR | Status: DC
  Administered 2014-12-10 – 2014-12-13 (×7): 3 mL via INTRAVENOUS

## 2014-12-09 MED ORDER — ACETAMINOPHEN 160 MG/5ML PO SOLN: 650.0000 mg | Freq: Once | ORAL | Status: AC

## 2014-12-09 MED ORDER — ARTIFICIAL TEARS OP OINT
TOPICAL_OINTMENT | OPHTHALMIC | Status: AC
  Filled 2014-12-09: qty 3.5

## 2014-12-09 MED ORDER — ROCURONIUM BROMIDE 100 MG/10ML IV SOLN
INTRAVENOUS | Status: DC | PRN
  Administered 2014-12-09: 100 mg via INTRAVENOUS

## 2014-12-09 MED ORDER — MORPHINE SULFATE (PF) 2 MG/ML IV SOLN
2.0000 mg | INTRAVENOUS | Status: DC | PRN
  Administered 2014-12-09: 2 mg via INTRAVENOUS
  Administered 2014-12-09 – 2014-12-10 (×2): 4 mg via INTRAVENOUS
  Administered 2014-12-10: 2 mg via INTRAVENOUS
  Administered 2014-12-11: 4 mg via INTRAVENOUS
  Filled 2014-12-09 (×3): qty 1
  Filled 2014-12-09 (×3): qty 2

## 2014-12-09 MED ORDER — LACTATED RINGERS IV SOLN: INTRAVENOUS | Status: DC

## 2014-12-09 MED ORDER — BISACODYL 10 MG RE SUPP: 10.0000 mg | Freq: Every day | RECTAL | Status: DC

## 2014-12-09 MED ORDER — FENTANYL CITRATE (PF) 250 MCG/5ML IJ SOLN
INTRAMUSCULAR | Status: AC
  Filled 2014-12-09: qty 5

## 2014-12-09 MED ORDER — SODIUM CHLORIDE 0.9 % IJ SOLN: 3.0000 mL | INTRAMUSCULAR | Status: DC | PRN

## 2014-12-09 MED ORDER — THROMBIN 20000 UNITS EX SOLR
CUTANEOUS | Status: AC
  Filled 2014-12-09: qty 20000

## 2014-12-09 MED ORDER — DOCUSATE SODIUM 100 MG PO CAPS
200.0000 mg | ORAL_CAPSULE | Freq: Every day | ORAL | Status: DC
  Administered 2014-12-10 – 2014-12-13 (×4): 200 mg via ORAL
  Filled 2014-12-09 (×4): qty 2

## 2014-12-09 MED ORDER — OXYCODONE HCL 5 MG PO TABS
5.0000 mg | ORAL_TABLET | ORAL | Status: DC | PRN
  Administered 2014-12-10: 10 mg via ORAL
  Administered 2014-12-10: 5 mg via ORAL
  Administered 2014-12-10 (×3): 10 mg via ORAL
  Administered 2014-12-10: 5 mg via ORAL
  Administered 2014-12-11: 10 mg via ORAL
  Administered 2014-12-11: 5 mg via ORAL
  Administered 2014-12-11 – 2014-12-13 (×5): 10 mg via ORAL
  Filled 2014-12-09 (×7): qty 2
  Filled 2014-12-09 (×2): qty 1
  Filled 2014-12-09 (×3): qty 2
  Filled 2014-12-09: qty 1

## 2014-12-09 MED ORDER — ACETAMINOPHEN 160 MG/5ML PO SOLN: 1000.0000 mg | Freq: Four times a day (QID) | ORAL | Status: DC

## 2014-12-09 MED ORDER — SODIUM CHLORIDE 0.9 % IV SOLN
INTRAVENOUS | Status: DC | PRN
  Administered 2014-12-09: 07:00:00 via INTRAVENOUS

## 2014-12-09 MED ORDER — FENTANYL CITRATE (PF) 100 MCG/2ML IJ SOLN
INTRAMUSCULAR | Status: DC | PRN
  Administered 2014-12-09: 50 ug via INTRAVENOUS
  Administered 2014-12-09: 150 ug via INTRAVENOUS
  Administered 2014-12-09: 50 ug via INTRAVENOUS
  Administered 2014-12-09 (×3): 100 ug via INTRAVENOUS
  Administered 2014-12-09: 200 ug via INTRAVENOUS
  Administered 2014-12-09 (×2): 250 ug via INTRAVENOUS

## 2014-12-09 MED ORDER — SODIUM CHLORIDE 0.9 % IV SOLN
INTRAVENOUS | Status: DC
  Administered 2014-12-09: 15:00:00 via INTRAVENOUS

## 2014-12-09 MED ORDER — MORPHINE SULFATE (PF) 2 MG/ML IV SOLN
1.0000 mg | INTRAVENOUS | Status: AC | PRN
  Administered 2014-12-09: 2 mg via INTRAVENOUS

## 2014-12-09 MED ORDER — THROMBIN 20000 UNITS EX SOLR
CUTANEOUS | Status: DC | PRN
  Administered 2014-12-09: 20000 [IU] via TOPICAL

## 2014-12-09 MED ORDER — PROPOFOL 10 MG/ML IV BOLUS
INTRAVENOUS | Status: AC
  Filled 2014-12-09: qty 20

## 2014-12-09 MED ORDER — THROMBIN 20000 UNITS EX SOLR
OROMUCOSAL | Status: DC | PRN
  Administered 2014-12-09 (×3): 4 mL via TOPICAL

## 2014-12-09 MED ORDER — METOPROLOL TARTRATE 12.5 MG HALF TABLET: 12.5000 mg | ORAL_TABLET | Freq: Two times a day (BID) | ORAL | Status: DC

## 2014-12-09 MED ORDER — METOPROLOL TARTRATE 1 MG/ML IV SOLN: 2.5000 mg | INTRAVENOUS | Status: DC | PRN

## 2014-12-09 MED ORDER — PROTAMINE SULFATE 10 MG/ML IV SOLN
INTRAVENOUS | Status: AC
  Filled 2014-12-09: qty 25

## 2014-12-09 MED ORDER — ALBUMIN HUMAN 5 % IV SOLN
250.0000 mL | INTRAVENOUS | Status: AC | PRN
  Administered 2014-12-09: 250 mL via INTRAVENOUS
  Filled 2014-12-09: qty 250

## 2014-12-09 MED ORDER — PANTOPRAZOLE SODIUM 40 MG PO TBEC
40.0000 mg | DELAYED_RELEASE_TABLET | Freq: Every day | ORAL | Status: DC
  Administered 2014-12-10 – 2014-12-13 (×4): 40 mg via ORAL
  Filled 2014-12-09 (×4): qty 1

## 2014-12-09 MED ORDER — PROTAMINE SULFATE 10 MG/ML IV SOLN
INTRAVENOUS | Status: AC
  Filled 2014-12-09: qty 5

## 2014-12-09 MED ORDER — FENTANYL CITRATE (PF) 250 MCG/5ML IJ SOLN
INTRAMUSCULAR | Status: AC
  Filled 2014-12-09: qty 20

## 2014-12-09 MED ORDER — LIDOCAINE HCL (CARDIAC) 20 MG/ML IV SOLN
INTRAVENOUS | Status: AC
  Filled 2014-12-09: qty 5

## 2014-12-09 MED ORDER — SUCCINYLCHOLINE CHLORIDE 20 MG/ML IJ SOLN
INTRAMUSCULAR | Status: AC
  Filled 2014-12-09: qty 1

## 2014-12-09 MED ORDER — NITROGLYCERIN IN D5W 200-5 MCG/ML-% IV SOLN: 0.0000 ug/min | INTRAVENOUS | Status: DC

## 2014-12-09 MED ORDER — CHLORHEXIDINE GLUCONATE 0.12 % MT SOLN
15.0000 mL | OROMUCOSAL | Status: AC
  Administered 2014-12-09: 15 mL via OROMUCOSAL
  Filled 2014-12-09: qty 15

## 2014-12-09 MED ORDER — ATORVASTATIN CALCIUM 20 MG PO TABS
20.0000 mg | ORAL_TABLET | Freq: Every day | ORAL | Status: DC
  Administered 2014-12-10 – 2014-12-15 (×6): 20 mg via ORAL
  Filled 2014-12-09 (×6): qty 1

## 2014-12-09 MED ORDER — LACTATED RINGERS IV SOLN: 500.0000 mL | Freq: Once | INTRAVENOUS | Status: DC | PRN

## 2014-12-09 MED ORDER — MAGNESIUM SULFATE 4 GM/100ML IV SOLN: 4.0000 g | Freq: Once | INTRAVENOUS | Status: DC

## 2014-12-09 MED ORDER — DEXTROSE 5 % IV SOLN
1.5000 g | INTRAVENOUS | Status: AC
  Administered 2014-12-10 – 2014-12-11 (×2): 1.5 g via INTRAVENOUS
  Filled 2014-12-09 (×2): qty 1.5

## 2014-12-09 MED ORDER — CISATRACURIUM BESYLATE 20 MG/10ML IV SOLN
INTRAVENOUS | Status: AC
  Filled 2014-12-09: qty 20

## 2014-12-09 MED ORDER — CHLORHEXIDINE GLUCONATE 0.12% ORAL RINSE (MEDLINE KIT)
15.0000 mL | Freq: Two times a day (BID) | OROMUCOSAL | Status: DC
  Administered 2014-12-09: 15 mL via OROMUCOSAL

## 2014-12-09 MED ORDER — PHENYLEPHRINE HCL 10 MG/ML IJ SOLN
INTRAMUSCULAR | Status: DC | PRN
  Administered 2014-12-09: 40 ug via INTRAVENOUS

## 2014-12-09 MED ORDER — EPHEDRINE SULFATE 50 MG/ML IJ SOLN
INTRAMUSCULAR | Status: AC
  Filled 2014-12-09: qty 1

## 2014-12-09 MED ORDER — INSULIN REGULAR BOLUS VIA INFUSION
0.0000 [IU] | Freq: Three times a day (TID) | INTRAVENOUS | Status: DC
Start: 2014-12-09 — End: 2014-12-10
  Filled 2014-12-09: qty 10

## 2014-12-09 MED ORDER — BISACODYL 5 MG PO TBEC
10.0000 mg | DELAYED_RELEASE_TABLET | Freq: Every day | ORAL | Status: DC
  Administered 2014-12-10 – 2014-12-13 (×4): 10 mg via ORAL
  Filled 2014-12-09 (×4): qty 2

## 2014-12-09 MED ORDER — DEXTROSE 5 % IV SOLN
0.0000 ug/min | INTRAVENOUS | Status: DC
  Administered 2014-12-09: 20 ug/min via INTRAVENOUS
  Administered 2014-12-10: 35 ug/min via INTRAVENOUS
  Administered 2014-12-10: 50 ug/min via INTRAVENOUS
  Administered 2014-12-11: 25 ug/min via INTRAVENOUS
  Administered 2014-12-12: 20 ug/min via INTRAVENOUS
  Filled 2014-12-09 (×7): qty 2

## 2014-12-09 MED ORDER — POTASSIUM CHLORIDE 10 MEQ/50ML IV SOLN: 10.0000 meq | INTRAVENOUS | Status: AC

## 2014-12-09 MED ORDER — SODIUM CHLORIDE 0.45 % IV SOLN
INTRAVENOUS | Status: DC | PRN
Start: 2014-12-09 — End: 2014-12-12
  Administered 2014-12-09: 15:00:00 via INTRAVENOUS

## 2014-12-09 MED ORDER — SODIUM CHLORIDE 0.9 % IV SOLN: Freq: Once | INTRAVENOUS | Status: DC

## 2014-12-09 MED ORDER — ANTISEPTIC ORAL RINSE SOLUTION (CORINZ)
7.0000 mL | Freq: Four times a day (QID) | OROMUCOSAL | Status: DC
  Administered 2014-12-10 (×2): 7 mL via OROMUCOSAL

## 2014-12-09 MED ORDER — SODIUM CHLORIDE 0.9 % IV SOLN
INTRAVENOUS | Status: DC
  Administered 2014-12-09 – 2014-12-12 (×2): via INTRAVENOUS

## 2014-12-09 MED ORDER — FAMOTIDINE IN NACL 20-0.9 MG/50ML-% IV SOLN
20.0000 mg | Freq: Once | INTRAVENOUS | Status: AC
  Administered 2014-12-09: 20 mg via INTRAVENOUS

## 2014-12-09 MED ORDER — HEMOSTATIC AGENTS (NO CHARGE) OPTIME
TOPICAL | Status: DC | PRN
  Administered 2014-12-09: 1 via TOPICAL

## 2014-12-09 MED ORDER — MIDAZOLAM HCL 10 MG/2ML IJ SOLN
INTRAMUSCULAR | Status: AC
  Filled 2014-12-09: qty 2

## 2014-12-09 MED ORDER — HEPARIN SODIUM (PORCINE) 1000 UNIT/ML IJ SOLN
INTRAMUSCULAR | Status: AC
  Filled 2014-12-09: qty 1

## 2014-12-09 MED ORDER — TRAMADOL HCL 50 MG PO TABS: 50.0000 mg | ORAL_TABLET | ORAL | Status: DC | PRN

## 2014-12-09 MED ORDER — INSULIN ASPART 100 UNIT/ML ~~LOC~~ SOLN
0.0000 [IU] | SUBCUTANEOUS | Status: DC
  Administered 2014-12-10 – 2014-12-11 (×3): 2 [IU] via SUBCUTANEOUS

## 2014-12-09 MED ORDER — 0.9 % SODIUM CHLORIDE (POUR BTL) OPTIME
TOPICAL | Status: DC | PRN
  Administered 2014-12-09: 5000 mL

## 2014-12-09 MED ORDER — FAMOTIDINE IN NACL 20-0.9 MG/50ML-% IV SOLN: 20.0000 mg | Freq: Two times a day (BID) | INTRAVENOUS | Status: DC

## 2014-12-09 MED ORDER — ASPIRIN EC 325 MG PO TBEC
325.0000 mg | DELAYED_RELEASE_TABLET | Freq: Every day | ORAL | Status: DC
  Administered 2014-12-10 – 2014-12-13 (×4): 325 mg via ORAL
  Filled 2014-12-09 (×4): qty 1

## 2014-12-09 MED ORDER — CHLORHEXIDINE GLUCONATE 4 % EX LIQD: 30.0000 mL | CUTANEOUS | Status: DC

## 2014-12-09 MED ORDER — ROCURONIUM BROMIDE 50 MG/5ML IV SOLN
INTRAVENOUS | Status: AC
  Filled 2014-12-09: qty 1

## 2014-12-09 MED ORDER — LIDOCAINE HCL (CARDIAC) 20 MG/ML IV SOLN
INTRAVENOUS | Status: DC | PRN
  Administered 2014-12-09: 100 mg via INTRAVENOUS

## 2014-12-09 MED ORDER — PROPOFOL 10 MG/ML IV BOLUS
INTRAVENOUS | Status: DC | PRN
  Administered 2014-12-09: 100 mg via INTRAVENOUS

## 2014-12-09 MED ORDER — VANCOMYCIN HCL 500 MG IV SOLR
500.0000 mg | Freq: Once | INTRAVENOUS | Status: AC
  Administered 2014-12-09: 500 mg via INTRAVENOUS
  Filled 2014-12-09 (×2): qty 500

## 2014-12-09 MED ORDER — ACETAMINOPHEN 650 MG RE SUPP
650.0000 mg | Freq: Once | RECTAL | Status: AC
  Administered 2014-12-09: 650 mg via RECTAL

## 2014-12-09 MED ORDER — SODIUM CHLORIDE 0.9 % IJ SOLN
INTRAMUSCULAR | Status: AC
  Filled 2014-12-09: qty 10

## 2014-12-09 MED ORDER — SODIUM CHLORIDE 0.9 % IV SOLN: 250.0000 mL | INTRAVENOUS | Status: DC

## 2014-12-09 MED ORDER — ACETAMINOPHEN 500 MG PO TABS
1000.0000 mg | ORAL_TABLET | Freq: Four times a day (QID) | ORAL | Status: DC
  Administered 2014-12-10 – 2014-12-12 (×8): 1000 mg via ORAL
  Filled 2014-12-09 (×7): qty 2

## 2014-12-09 MED ORDER — CISATRACURIUM BESYLATE (PF) 10 MG/5ML IV SOLN
INTRAVENOUS | Status: DC | PRN
  Administered 2014-12-09 (×4): 10 mg via INTRAVENOUS

## 2014-12-09 MED ORDER — EPHEDRINE SULFATE 50 MG/ML IJ SOLN
INTRAMUSCULAR | Status: DC | PRN
  Administered 2014-12-09: 5 mg via INTRAVENOUS

## 2014-12-09 MED ORDER — HEPARIN SODIUM (PORCINE) 1000 UNIT/ML IJ SOLN
INTRAMUSCULAR | Status: DC | PRN
  Administered 2014-12-09: 32000 [IU] via INTRAVENOUS

## 2014-12-09 MED ORDER — MIDAZOLAM HCL 2 MG/2ML IJ SOLN
2.0000 mg | INTRAMUSCULAR | Status: DC | PRN
Start: 2014-12-09 — End: 2014-12-10

## 2014-12-09 MED FILL — Mannitol IV Soln 20%: INTRAVENOUS | Qty: 500 | Status: AC

## 2014-12-09 MED FILL — Heparin Sodium (Porcine) Inj 1000 Unit/ML: INTRAMUSCULAR | Qty: 10 | Status: AC

## 2014-12-09 MED FILL — Sodium Chloride IV Soln 0.9%: INTRAVENOUS | Qty: 3000 | Status: AC

## 2014-12-09 MED FILL — Sodium Bicarbonate IV Soln 8.4%: INTRAVENOUS | Qty: 100 | Status: AC

## 2014-12-09 MED FILL — Electrolyte-R (PH 7.4) Solution: INTRAVENOUS | Qty: 3000 | Status: AC

## 2014-12-09 MED FILL — Heparin Sodium (Porcine) Inj 1000 Unit/ML: INTRAMUSCULAR | Qty: 30 | Status: AC

## 2014-12-09 MED FILL — Potassium Chloride Inj 2 mEq/ML: INTRAVENOUS | Qty: 40 | Status: AC

## 2014-12-09 MED FILL — Magnesium Sulfate Inj 50%: INTRAMUSCULAR | Qty: 10 | Status: AC

## 2014-12-09 MED FILL — Lidocaine HCl IV Inj 20 MG/ML: INTRAVENOUS | Qty: 5 | Status: AC

## 2014-12-09 SURGICAL SUPPLY — 118 items
ADAPTER CARDIO PERF ANTE/RETRO (ADAPTER) ×3 IMPLANT
BAG DECANTER FOR FLEXI CONT (MISCELLANEOUS) ×3 IMPLANT
BANDAGE ELASTIC 4 VELCRO ST LF (GAUZE/BANDAGES/DRESSINGS) ×3 IMPLANT
BANDAGE ELASTIC 6 VELCRO ST LF (GAUZE/BANDAGES/DRESSINGS) ×3 IMPLANT
BASKET HEART (ORDER IN 25'S) (MISCELLANEOUS) ×1
BASKET HEART (ORDER IN 25S) (MISCELLANEOUS) ×2 IMPLANT
BLADE STERNUM SYSTEM 6 (BLADE) ×3 IMPLANT
BLADE SURG 15 STRL LF DISP TIS (BLADE) ×2 IMPLANT
BLADE SURG 15 STRL SS (BLADE) ×1
BNDG GAUZE ELAST 4 BULKY (GAUZE/BANDAGES/DRESSINGS) ×3 IMPLANT
CANISTER SUCTION 2500CC (MISCELLANEOUS) ×3 IMPLANT
CANNULA GUNDRY RCSP 15FR (MISCELLANEOUS) ×3 IMPLANT
CATH HEART VENT LEFT (CATHETERS) ×2 IMPLANT
CATH ROBINSON RED A/P 18FR (CATHETERS) ×9 IMPLANT
CATH THORACIC 28FR (CATHETERS) ×3 IMPLANT
CATH THORACIC 36FR (CATHETERS) ×3 IMPLANT
CATH THORACIC 36FR RT ANG (CATHETERS) ×3 IMPLANT
CLIP TI MEDIUM 24 (CLIP) IMPLANT
CLIP TI WIDE RED SMALL 24 (CLIP) ×3 IMPLANT
CONT SPEC 4OZ CLIKSEAL STRL BL (MISCELLANEOUS) ×3 IMPLANT
CONT SPEC STER OR (MISCELLANEOUS) ×3 IMPLANT
COVER SURGICAL LIGHT HANDLE (MISCELLANEOUS) IMPLANT
CRADLE DONUT ADULT HEAD (MISCELLANEOUS) ×3 IMPLANT
DRAPE CARDIOVASCULAR INCISE (DRAPES) ×1
DRAPE SLUSH/WARMER DISC (DRAPES) ×3 IMPLANT
DRAPE SRG 135X102X78XABS (DRAPES) ×2 IMPLANT
DRSG COVADERM 4X14 (GAUZE/BANDAGES/DRESSINGS) ×3 IMPLANT
ELECT CAUTERY BLADE 6.4 (BLADE) ×3 IMPLANT
ELECT REM PT RETURN 9FT ADLT (ELECTROSURGICAL) ×6
ELECTRODE REM PT RTRN 9FT ADLT (ELECTROSURGICAL) ×4 IMPLANT
GAUZE SPONGE 4X4 12PLY STRL (GAUZE/BANDAGES/DRESSINGS) ×6 IMPLANT
GLOVE BIO SURGEON STRL SZ 6 (GLOVE) ×6 IMPLANT
GLOVE BIO SURGEON STRL SZ 6.5 (GLOVE) ×6 IMPLANT
GLOVE BIO SURGEON STRL SZ7 (GLOVE) IMPLANT
GLOVE BIO SURGEON STRL SZ7.5 (GLOVE) ×6 IMPLANT
GLOVE BIOGEL PI IND STRL 6 (GLOVE) ×2 IMPLANT
GLOVE BIOGEL PI IND STRL 6.5 (GLOVE) ×10 IMPLANT
GLOVE BIOGEL PI IND STRL 7.0 (GLOVE) IMPLANT
GLOVE BIOGEL PI INDICATOR 6 (GLOVE) ×1
GLOVE BIOGEL PI INDICATOR 6.5 (GLOVE) ×5
GLOVE BIOGEL PI INDICATOR 7.0 (GLOVE)
GLOVE EUDERMIC 7 POWDERFREE (GLOVE) ×6 IMPLANT
GLOVE ORTHO TXT STRL SZ7.5 (GLOVE) IMPLANT
GOWN STRL REUS W/ TWL LRG LVL3 (GOWN DISPOSABLE) ×16 IMPLANT
GOWN STRL REUS W/ TWL XL LVL3 (GOWN DISPOSABLE) ×2 IMPLANT
GOWN STRL REUS W/TWL LRG LVL3 (GOWN DISPOSABLE) ×8
GOWN STRL REUS W/TWL XL LVL3 (GOWN DISPOSABLE) ×1
HEART VENT LT CURVED (MISCELLANEOUS) ×3 IMPLANT
HEMOSTAT POWDER SURGIFOAM 1G (HEMOSTASIS) ×9 IMPLANT
HEMOSTAT SURGICEL 2X14 (HEMOSTASIS) ×3 IMPLANT
INSERT FOGARTY 61MM (MISCELLANEOUS) IMPLANT
INSERT FOGARTY XLG (MISCELLANEOUS) IMPLANT
KIT BASIN OR (CUSTOM PROCEDURE TRAY) ×3 IMPLANT
KIT CATH CPB BARTLE (MISCELLANEOUS) ×3 IMPLANT
KIT ROOM TURNOVER OR (KITS) ×3 IMPLANT
KIT SUCTION CATH 14FR (SUCTIONS) ×3 IMPLANT
KIT VASOVIEW W/TROCAR VH 2000 (KITS) ×3 IMPLANT
LINE VENT (MISCELLANEOUS) ×3 IMPLANT
NS IRRIG 1000ML POUR BTL (IV SOLUTION) ×18 IMPLANT
PACK OPEN HEART (CUSTOM PROCEDURE TRAY) ×3 IMPLANT
PAD ARMBOARD 7.5X6 YLW CONV (MISCELLANEOUS) ×6 IMPLANT
PAD ELECT DEFIB RADIOL ZOLL (MISCELLANEOUS) ×3 IMPLANT
PENCIL BUTTON HOLSTER BLD 10FT (ELECTRODE) ×3 IMPLANT
PUNCH AORTIC ROTATE 4.0MM (MISCELLANEOUS) IMPLANT
PUNCH AORTIC ROTATE 4.5MM 8IN (MISCELLANEOUS) ×3 IMPLANT
PUNCH AORTIC ROTATE 5MM 8IN (MISCELLANEOUS) IMPLANT
SET CARDIOPLEGIA MPS 5001102 (MISCELLANEOUS) ×3 IMPLANT
SPONGE INTESTINAL PEANUT (DISPOSABLE) IMPLANT
SPONGE LAP 18X18 X RAY DECT (DISPOSABLE) IMPLANT
SPONGE LAP 4X18 X RAY DECT (DISPOSABLE) IMPLANT
SUT BONE WAX W31G (SUTURE) ×3 IMPLANT
SUT ETHIBON 2 0 V 52N 30 (SUTURE) ×6 IMPLANT
SUT ETHIBOND 2 0 SH (SUTURE) ×4
SUT ETHIBOND 2 0 SH 36X2 (SUTURE) ×8 IMPLANT
SUT MNCRL AB 4-0 PS2 18 (SUTURE) IMPLANT
SUT PROLENE 3 0 SH DA (SUTURE) IMPLANT
SUT PROLENE 3 0 SH1 36 (SUTURE) ×3 IMPLANT
SUT PROLENE 4 0 RB 1 (SUTURE) ×6
SUT PROLENE 4 0 SH DA (SUTURE) IMPLANT
SUT PROLENE 4-0 RB1 .5 CRCL 36 (SUTURE) ×12 IMPLANT
SUT PROLENE 5 0 C 1 36 (SUTURE) ×6 IMPLANT
SUT PROLENE 6 0 C 1 30 (SUTURE) ×6 IMPLANT
SUT PROLENE 7 0 BV 1 (SUTURE) IMPLANT
SUT PROLENE 7 0 BV1 MDA (SUTURE) ×6 IMPLANT
SUT PROLENE 8 0 BV175 6 (SUTURE) ×12 IMPLANT
SUT SILK  1 MH (SUTURE) ×3
SUT SILK 1 MH (SUTURE) ×6 IMPLANT
SUT SILK 1 TIES 10X30 (SUTURE) ×3 IMPLANT
SUT SILK 2 0 SH CR/8 (SUTURE) ×6 IMPLANT
SUT SILK 2 0 TIES 10X30 (SUTURE) ×3 IMPLANT
SUT SILK 2 0 TIES 17X18 (SUTURE) ×1
SUT SILK 2-0 18XBRD TIE BLK (SUTURE) ×2 IMPLANT
SUT SILK 3 0 SH CR/8 (SUTURE) ×3 IMPLANT
SUT SILK 4 0 TIE 10X30 (SUTURE) ×6 IMPLANT
SUT STEEL 6MS V (SUTURE) ×3 IMPLANT
SUT STEEL STERNAL CCS#1 18IN (SUTURE) IMPLANT
SUT STEEL SZ 6 DBL 3X14 BALL (SUTURE) ×3 IMPLANT
SUT TEM PAC WIRE 2 0 SH (SUTURE) ×12 IMPLANT
SUT VIC AB 1 CTX 36 (SUTURE) ×2
SUT VIC AB 1 CTX36XBRD ANBCTR (SUTURE) ×4 IMPLANT
SUT VIC AB 2-0 CT1 27 (SUTURE) ×1
SUT VIC AB 2-0 CT1 TAPERPNT 27 (SUTURE) ×2 IMPLANT
SUT VIC AB 2-0 CTX 27 (SUTURE) ×6 IMPLANT
SUT VIC AB 3-0 SH 27 (SUTURE)
SUT VIC AB 3-0 SH 27X BRD (SUTURE) IMPLANT
SUT VIC AB 3-0 X1 27 (SUTURE) ×9 IMPLANT
SUT VICRYL 4-0 PS2 18IN ABS (SUTURE) IMPLANT
SUTURE E-PAK OPEN HEART (SUTURE) IMPLANT
SYSTEM SAHARA CHEST DRAIN ATS (WOUND CARE) ×3 IMPLANT
TAPE CLOTH SURG 4X10 WHT LF (GAUZE/BANDAGES/DRESSINGS) ×3 IMPLANT
TOWEL OR 17X24 6PK STRL BLUE (TOWEL DISPOSABLE) ×6 IMPLANT
TOWEL OR 17X26 10 PK STRL BLUE (TOWEL DISPOSABLE) ×6 IMPLANT
TRAY FOLEY IC TEMP SENS 16FR (CATHETERS) ×3 IMPLANT
TUBING INSUFFLATION (TUBING) ×3 IMPLANT
UNDERPAD 30X30 INCONTINENT (UNDERPADS AND DIAPERS) ×3 IMPLANT
VALVE MAGNA EASE AORTIC 23MM (Prosthesis & Implant Heart) ×3 IMPLANT
VENT LEFT HEART 12002 (CATHETERS) ×3
WATER STERILE IRR 1000ML POUR (IV SOLUTION) ×6 IMPLANT

## 2014-12-09 NOTE — Progress Notes (Signed)
Patient ID: Destin Jonasson, male   DOB: July 07, 1930, 81 y.o.   MRN: [id number] EVENING ROUNDS NOTE :     [address]       [place] [address]             [phone]                 Day of Surgery Procedure(s) (LRB): AORTIC VALVE REPLACEMENT (AVR) WITH 23MM MAGNA EASE BIOPROSTHETIC VALVE (N/A) CORONARY ARTERY BYPASS GRAFTING (CABG), ON PUMP, TIMES THREE, USING LEFT INTERNAL MAMMARY ARTERY, RIGHT GREATER SAPHENOUS VEIN HARVESTED ENDOSCOPICALLY (N/A) TRANSESOPHAGEAL ECHOCARDIOGRAM (TEE) (N/A)  Total Length of Stay:  LOS: 0 days  BP 100/53 mmHg  Pulse 80  Temp(Src) 99.3 F (37.4 C) (Oral)  Resp 12  Wt 171 lb (77.565 kg)  SpO2 100%  .Intake/Output      12/04 0701 - 12/05 0700 12/05 0701 - 12/06 0700   I.V. (mL/kg)  1223.2 (15.8)   Blood  448   IV Piggyback  50   Total Intake(mL/kg)  1721.2 (22.2)   Urine (mL/kg/hr)  615 (0.7)   Emesis/NG output  100 (0.1)   Blood  800 (0.9)   Chest Tube  180 (0.2)   Total Output   1695   Net   +26.2          . sodium chloride 20 mL/hr at 12/09/14 1800  . [START ON 12/10/2014] sodium chloride    . sodium chloride 20 mL/hr at 12/09/14 1500  . sodium chloride 20 mL/hr at 12/09/14 1800  . dexmedetomidine Stopped (12/09/14 1800)  . insulin (NOVOLIN-R) infusion 1.2 Units/hr (12/09/14 1810)  . nitroGLYCERIN    . phenylephrine (NEO-SYNEPHRINE) Adult infusion 40 mcg/min (12/09/14 1720)     Lab Results  Component Value Date   WBC 7.9 12/09/2014   HGB 10.9* 12/09/2014   HCT 32.0* 12/09/2014   PLT 128* 12/09/2014   GLUCOSE 122* 12/09/2014   CHOL 117 02/07/2013   TRIG 129 02/07/2013   HDL 36 02/07/2013   LDLCALC 55 02/07/2013   ALT 13* 12/06/2014   AST 13* 12/06/2014   NA 138 12/09/2014   K 4.4 12/09/2014   CL 97* 12/09/2014   CREATININE 6.10* 12/09/2014   BUN 34* 12/09/2014   CO2 24 12/06/2014   INR 1.42 12/09/2014   HGBA1C 5.2 12/06/2014   Not bleeding On vent  k 4.4   Grace Isaac MD  Beeper  [phone] Office [phone] 12/09/2014 6:48 PM

## 2014-12-09 NOTE — Brief Op Note (Addendum)
12/09/2014      [address]       [place] [address]             [phone]     12/09/2014  12:02 PM  PATIENT:  Eric [name]  81 y.o. male  PRE-OPERATIVE DIAGNOSIS:  CAD AS  POST-OPERATIVE DIAGNOSIS:  CAD AS  PROCEDURE:  Procedure(s): AORTIC VALVE REPLACEMENT (AVR) WITH 23MM MAGNA EASE BIOPROSTHETIC VALVE CORONARY ARTERY BYPASS GRAFTING (CABG), ON PUMP, TIMES THREE, USING LEFT INTERNAL MAMMARY ARTERY, RIGHT GREATER SAPHENOUS VEIN HARVESTED ENDOSCOPICALLY(LIMA-LAD; SVG-OM; SVG-PD) TRANSESOPHAGEAL ECHOCARDIOGRAM (TEE)  SURGEON:  Surgeon(s): Gaye Pollack, MD  PHYSICIAN ASSISTANT: WAYNE GOLD PA-C  ANESTHESIA:   general  PATIENT CONDITION:  ICU - intubated and hemodynamically stable.  PRE-OPERATIVE WEIGHT: [id number]  COMPLICATIONS: NO KNOWN  EBL: SEE ANEST/PERFUSION RECORDS   Aortic Valve  Procedure Performed:  Replacement: Yes.  Bioprosthetic Valve. Implant Model Number:23, Size:[id number], Unique Device Identifier:[id number]  Repair/Reconstruction: No.   Aortic Annular Enlargement: No. Aortic Valve Etiology   Aortic Insufficiency:  Trivial/Trace  Aortic Valve Disease:  Yes.  Aortic Stenosis:  Yes. Smallest Aortic Valve Area: 1.29cm2; Highest Mean Gradient: 58mmHg.  Etiology (Choose at least one and up to  5 etiologies):  Degenerative - Calcified

## 2014-12-09 NOTE — Interval H&P Note (Signed)
History and Physical Interval Note:  12/09/2014 6:50 AM  Eric [name]  has presented today for surgery, with the diagnosis of CAD AS  The various methods of treatment have been discussed with the patient and family. After consideration of risks, benefits and other options for treatment, the patient has consented to  Procedure(s): AORTIC VALVE REPLACEMENT (AVR) (N/A) CORONARY ARTERY BYPASS GRAFTING (CABG) (N/A) TRANSESOPHAGEAL ECHOCARDIOGRAM (TEE) (N/A) as a surgical intervention .  The patient's history has been reviewed, patient examined, no change in status, stable for surgery.  I have reviewed the patient's chart and labs.  Questions were answered to the patient's satisfaction.     Gaye Pollack

## 2014-12-09 NOTE — Progress Notes (Addendum)
  Echocardiogram Transesophageal echocardiogram has been performed.  Eric [name] 12/09/2014, 8:48 AM

## 2014-12-09 NOTE — Progress Notes (Signed)
NIF -32, VC 1.9L. Pt is stable at this time, stable throughout and following commands.

## 2014-12-09 NOTE — Progress Notes (Signed)
Pt passed all weaning parameters including ABG, pulmonary mechanics, and vitals are stable. I assess pt for a postive cuff leak and cuff leak was absent. No cuff leak or air movement around the ETT once balloon was deflated. Pt was ask to cough and still no cuff leak. Thought maybe if he cough the balloon would not stick to his tracheal wall. Pt was place back on the vent on PS/CPAP. Pt is resting at this time, no distress or complications noted. RN aware. RN will give meds. Try to re-assess cuff leak later. RT will continue to monitor.

## 2014-12-09 NOTE — Progress Notes (Signed)
Weaning process is initiated, pt tolerating well thus far, no distress or complications noted. RN aware. o2 saturations are stable. RT will continue to monitor

## 2014-12-09 NOTE — Anesthesia Procedure Notes (Addendum)
Central Venous Catheter Insertion Performed by: anesthesiologist 12/09/2014 7:13 AM Patient location: Pre-op. Preanesthetic checklist: patient identified, IV checked, site marked, risks and benefits discussed, surgical consent, monitors and equipment checked, pre-op evaluation, timeout performed and anesthesia consent Position: Trendelenburg Lidocaine 1% used for infiltration Landmarks identified and Seldinger technique used Catheter size: 8.5 Fr PA cath was placed.Sheath introducer Swan type and PA catheter depth:thermodilation and 48PA Cath depth:48 Procedure performed using ultrasound guided technique. Attempts: 1 Following insertion, line sutured. Post procedure assessment: blood return through all ports, free fluid flow and no air. Patient tolerated the procedure well with no immediate complications.  Procedure Name: Intubation Date/Time: 12/09/2014 7:46 AM Performed by: Rogers Blocker Pre-anesthesia Checklist: Patient identified, Emergency Drugs available, Suction available, Patient being monitored and Timeout performed Patient Re-evaluated:Patient Re-evaluated prior to inductionOxygen Delivery Method: Circle system utilized Preoxygenation: Pre-oxygenation with 100% oxygen Intubation Type: IV induction Laryngoscope Size: Mac and 4 Grade View: Grade I Tube type: Oral Tube size: 8.0 mm Number of attempts: 1 Airway Equipment and Method: Stylet Placement Confirmation: ETT inserted through vocal cords under direct vision,  breath sounds checked- equal and bilateral and positive ETCO2 Secured at: 24 cm Tube secured with: Tape Dental Injury: Teeth and Oropharynx as per pre-operative assessment

## 2014-12-09 NOTE — Progress Notes (Signed)
Pt is on PS/CPAP 10/5 at this time, pt is tolerating it well. No complications noted. RN aware

## 2014-12-09 NOTE — Progress Notes (Signed)
Pt is back on a Rate at this time so patient wont get tired out. Pt is stable at this time no complications noted.

## 2014-12-09 NOTE — Op Note (Signed)
CARDIOVASCULAR SURGERY OPERATIVE NOTE  12/09/2014  Surgeon:  Gaye Pollack, MD  First Assistant: Jadene Pierini,  PA-C   Preoperative Diagnosis:  Severe multi-vessel coronary artery disease, Moderate aortic stenosis.   Postoperative Diagnosis:  Same   Procedure:  1. Median Sternotomy 2. Extracorporeal circulation 3.   Coronary artery bypass grafting x 3   Left internal mammary graft to the LAD  SVG to OM2  SVG to PDA  4.   Endoscopic vein harvest from the right leg 5.   Aortic valve replacement using a 23 mm Edwards Magna-Ease pericardial valve   Anesthesia:  General Endotracheal   Clinical History/Surgical Indication:  The patient is an 81 year old gentleman with HTN and ESRD on HD who reports progressive exertional fatigue and shortness of breath over the past year or so. He had remained fairly active despite being on dialysis for the past year. He had a dialysis catheter in for a while and could not exercise while that was in. It was removed earlier this month and when he tried to exercise he developed shortness of breath, fatigue and chest tightness quickly. He had an ECG that showed new inferior changes and was referred to cardiology. Cardiac cath on 11/27/2014 showed severe 3-vessel coronary disease. The proximal and mid LAD were heavily calcified with 80% proximal and 99% mid LAD stenosis followed by a 90% mid stenosis. The LCX had a 60% stenosis in a large OM2 and an occluded OM3 that long but small in diameter or underfilled by the collat from the LAD. The RCA is occluded distally with faint filling of a small diameter PDA branch by collat from the LAD. He also had a heart murmur on exam and an echo showed at least moderate AS with a mean gradient of 36 mm Hg and a DI of 0.21. LVEF was reduced to 35% with anterior, septal and inferior wall motion abnormalities.   He has severe 3-  vessel coronary artery disease with a diffusely calcified proximal to mid LAD with multiple high grade stenoses in this area. The distal vessel looks like a reasonable vessel for grafting and supplies collaterals to the occluded OM3 and PDA. The OM and PDA look fairly small in diameter but may just be under-filled. There is also at least moderate and probably severe symptomatic aortic stenosis. I have personally reviewed his echo and the aortic valve is trileaflet with heavy leaflet calcification and reduced mobility. The mean gradient is 36 and the DI is 0.21 with a reduced EF of 35-40% so he may have low EF, lower gradient severe AS. I agree with Dr. Angelena Form that CABG and AVR is the best treatment for him despite his age and end stage renal failure. He is in fairly good condition for his age and still trying to remain as active as possible, limited by chronic systolic and diastolic heart failure with exertional fatigue and shortness of breath. I discussed the operative procedure with the patient and family including alternatives, benefits and risks; including but not limited to bleeding, blood transfusion, infection, stroke, myocardial infarction, graft failure, heart block requiring a permanent pacemaker, organ dysfunction, and death. [name] understands and agrees to proceed.   Preparation:  The patient was seen in the preoperative holding area and the correct patient, correct operation were confirmed with the patient after reviewing the medical record and catheterization. The consent was signed by me. Preoperative antibiotics were given. A pulmonary arterial line and radial arterial line were placed by the anesthesia  team. The patient was taken back to the operating room and positioned supine on the operating room table. After being placed under general endotracheal anesthesia by the anesthesia team a foley catheter was placed. The neck, chest, abdomen, and both legs were prepped with betadine soap  and solution and draped in the usual sterile manner. A surgical time-out was taken and the correct patient and operative procedure were confirmed with the nursing and anesthesia staff.  TEE: performed by Dr. Earnest Bailey. This showed moderate aortic stenosis and mild AI. There was mild MR. LV function was moderately depressed with an EF of 40%.   Cardiopulmonary Bypass:  A median sternotomy was performed. The pericardium was opened in the midline. Right ventricular function appeared normal. The ascending aorta was of normal size and had no palpable plaque. There were no contraindications to aortic cannulation or cross-clamping. The patient was fully systemically heparinized and the ACT was maintained > 400 sec. The proximal aortic arch was cannulated with a 20 F aortic cannula for arterial inflow. Venous cannulation was performed via the right atrial appendage using a two-staged venous cannula. An antegrade cardioplegia/vent cannula was inserted into the mid-ascending aorta. A retrograde cardioplegia cannula was inserted into the coronary sinus via the right atrium. A left ventricular vent was placed via the right superior pulmonary vein.  Aortic occlusion was performed with a single cross-clamp. Systemic cooling to 28 degrees Centigrade and topical cooling of the heart with iced saline were used. Hyperkalemic antegrade and retrograde cold blood cardioplegia was used to induce diastolic arrest and was then given at about 20 minute intervals throughout the period of arrest to maintain myocardial temperature at or below 10 degrees centigrade. A temperature probe was inserted into the interventricular septum and an insulating pad was placed in the pericardium.   Left internal mammary harvest:  The left side of the sternum was retracted using the Rultract retractor. The left internal mammary artery was harvested as a pedicle graft. All side branches were clipped. It was a medium-sized vessel of good  quality with excellent blood flow. It was ligated distally and divided. It was sprayed with topical papaverine solution to prevent vasospasm.   Endoscopic vein harvest:  The right greater saphenous vein was harvested endoscopically through a 2 cm incision medial to the right knee. It was harvested from the upper thigh to below the knee. It was a medium-sized vein of good quality. The side branches were all ligated with 4-0 silk ties.    Coronary arteries:  The coronary arteries were examined.   LAD:  Large vessel with no distal disease. The diagonals were small and diffusely diseased with no visible lumen.  LCX:  OM2 was a large vessel that was heavily diseased proximally but had no distal disease. The OM3 was small and diffusely diseased and not graftable.  RCA:  The PDA was small but graftable proximally.   Grafts:  1. LIMA to the LAD: 2.5 mm. It was sewn end to side using 8-0 prolene continuous suture. 2. SVG to OM2:  2.0 mm. It was sewn end to side using 7-0 prolene continuous suture. 3. SVG to PDA:  1.5 mm. It was sewn end to side using 7-0 prolene continuous suture.   The proximal vein graft anastomoses were performed to the mid-ascending aorta using continuous 6-0 prolene suture. Graft markers were placed around the proximal anastomoses.  Aortic Valve Replacement:  A transverse aortotomy was performed 1 cm above the take-off of the right coronary artery.  The native valve was tricuspid with calcified leaflets and moderate annular calcification. The ostia of the coronary arteries were in normal position and were not obstructed. The native valve leaflets were excised and the annulus was decalcified with rongeurs. Care was taken to remove all particulate debris. The left ventricle was directly inspected for debris and then irrigated with ice saline solution. The annulus was sized and a size 23 mm [employer] Ease pericardial valve was chosen. The model number was [id number] and the  serial number was [id number]. While the valve was being prepared 2-0 Ethibond pledgeted horizontal mattress sutures were placed around the annulus with the pledgets in a sub-annular position. The sutures were placed through the sewing ring and the valve lowered into place. The sutures were tied sequentially. The valve seated nicely and the coronary ostia were not obstructed. The prosthetic valve leaflets moved normally and there was no sub-valvular obstruction. The aortotomy was closed using 4-0 Prolene suture in 2 layers with felt strips to reinforce the closure.  Completion:  The patient was rewarmed to 37 degrees Centigrade. The clamp was removed from the LIMA pedicle and there was rapid warming of the septum and return of ventricular fibrillation. The crossclamp was removed with a time of 122 minutes. There was spontaneous return of sinus rhythm. The distal and proximal anastomoses were checked for hemostasis. The position of the grafts was satisfactory. Two temporary epicardial pacing wires were placed on the right atrium and two on the right ventricle. The patient was weaned from CPB without difficulty on no inotropes. CPB time was 152 minutes. Cardiac output was 6 LPM. TEE showed a normal functioning aortic valve prosthesis with no paravalvular leak or regurgitation through the valve. LV function appeared improved. There was mild unchanged MR. Heparin was fully reversed with protamine and the aortic and venous cannulas removed. Hemostasis was achieved. Mediastinal and left pleural drainage tubes were placed. The sternum was closed with double #6 stainless steel wires. The fascia was closed with continuous # 1 vicryl suture. The subcutaneous tissue was closed with 2-0 vicryl continuous suture. The skin was closed with 3-0 vicryl subcuticular suture. All sponge, needle, and instrument counts were reported correct at the end of the case. Dry sterile dressings were placed over the incisions and around the chest  tubes which were connected to pleurevac suction. The patient was then transported to the surgical intensive care unit in critical but stable condition.

## 2014-12-09 NOTE — OR Nursing (Signed)
Second call to SICU charge nurse at 1347.

## 2014-12-09 NOTE — Progress Notes (Signed)
Pt has a audible cuff leak. RN aware, proceeding with weaning.

## 2014-12-09 NOTE — Procedures (Signed)
Extubation Procedure Note  Patient Details:   Name: Holdan Tipler DOB: 06-10-1930 MRN: [id number]   Airway Documentation:     Evaluation  O2 sats: stable throughout Complications: No apparent complications Patient did tolerate procedure well. Bilateral Breath Sounds: Clear, Diminished Suctioning: Airway, Oral Yes  Procedure was explain to pt prior to extubation. Pt passed all weaning parameters. NIF -32, VC 1.9L. Good ABG.  Pt has a positive cuff leak no complications noted. Pt was ask to take a deep breath in prior to extubation to expand vocal cords. Pt nodded his head for understanding. Pt was extubated to a 4L [place], o2 saturations stable throughout no complications or distress noted. Pt was able to state his name post extubation.    Leigh Aurora 12/09/2014, 11:18 PM

## 2014-12-09 NOTE — OR Nursing (Signed)
First call to SICU charge nurse at 1303.

## 2014-12-09 NOTE — Transfer of Care (Signed)
Immediate Anesthesia Transfer of Care Note  Patient: Eric [name]  Procedure(s) Performed: Procedure(s) with comments: AORTIC VALVE REPLACEMENT (AVR) WITH 23MM MAGNA EASE BIOPROSTHETIC VALVE (N/A) CORONARY ARTERY BYPASS GRAFTING (CABG), ON PUMP, TIMES THREE, USING LEFT INTERNAL MAMMARY ARTERY, RIGHT GREATER SAPHENOUS VEIN HARVESTED ENDOSCOPICALLY (N/A) - LIMA-LAD; SVG-OM; SVG-PD TRANSESOPHAGEAL ECHOCARDIOGRAM (TEE) (N/A)  Patient Location: SICU  Anesthesia Type:General  Level of Consciousness: sedated, unresponsive, to remain intubated per anesthesia plan  Airway & Oxygen Therapy: Patient remains intubated per anesthesia plan and Patient placed on Ventilator (see vital sign flow sheet for setting)  Post-op Assessment: Report given to RN and Post -op Vital signs reviewed and stable  Post vital signs: Reviewed and stable  Last Vitals:  Filed Vitals:   12/09/14 0619 12/09/14 0650  BP: 156/71   Pulse: 91 80  Temp: 36.6 C   Resp: 18     Complications: No apparent anesthesia complications

## 2014-12-09 NOTE — Progress Notes (Signed)
Pulmonary Mechanics are NIF -22, VC 1.3L. Pt is stable at this time. Passed all weaning parameters

## 2014-12-09 NOTE — Consult Note (Signed)
[facility] Renal Consultation Note  Indication for Consultation:  Management of ESRD/hemodialysis; anemia, hypertension/volume and secondary hyperparathyroidism  HPI: Eric [name] is a 81 y.o. male with HO  ERSD (op [facility]/ lives at [place] with wife both independently living currently) ,CAD and Aortic Stenosis with progressive exertional fatigue and SOB admitted  By Dr. Cyndia Bent for CABG and AVR with tissue Valve. Now postop stable in SICU.  We are consulted for Dialysis care issues . Noted past week leaving below his EDW of 78.5 kg at 77.9 and 77.2 recent op hd txs.       Past Medical History  Diagnosis Date  . CKD (chronic kidney disease) stage 5, GFR less than 15 ml/min (HCC)   . Unspecified essential hypertension   . GERD (gastroesophageal reflux disease)   . Gout   . Herpes zoster 04/2012  . Heart murmur     as a teen  . Anxiety   . Arthritis   . Complication of anesthesia     " one time my heart stopped due to a medication that I was on."   . Shortness of breath dyspnea     with exertion  . Hemodialysis patient [facility])     Tues, Thursday, Saturday    Past Surgical History  Procedure Laterality Date  . Appendectomy  1944  . Cataract extraction w/ intraocular lens implant Bilateral 2014    [place]  . Dialysis fistula creation  08/04/2012 and 10/13    Dr. Harden Mo  . Colonoscopy  2013    Dr. Annamaria Helling [place], [place].  Marland Kitchen Av fistula placement Left     Left Cimino AVF placed in [place]   . Insertion of dialysis catheter Right 01-15-14    Right chest TDC placed by Dr. Augustin Coupe at [facility]  . Eye surgery      Cataract  . Av fistula placement Right 03/06/2014    Procedure: ARTERIOVENOUS (AV) FISTULA CREATION-right radiocephalic;  Surgeon: Mal Misty, MD;  Location: [place];  Service: Vascular;  Laterality: Right;  . Ligation of competing branches of arteriovenous fistula Right 05/08/2014    Procedure: LIGATION OF COMPETING BRANCHES  OF RIGHT ARM RADIOCEPHALIC ARTERIOVENOUS FISTULA;  Surgeon: Mal Misty, MD;  Location: [place];  Service: Vascular;  Laterality: Right;  . Av fistula placement Right 07/15/2014    Procedure: ARTERIOVENOUS (AV) FISTULA CREATION;  Surgeon: Elam Dutch, MD;  Location: [facility] OR;  Service: Vascular;  Laterality: Right;  . Ligation of arteriovenous  fistula Right 07/15/2014    Procedure: LIGATION OF ARTERIOVENOUS  FISTULA  (RIGHT RADIOCEPHALIC);  Surgeon: Elam Dutch, MD;  Location: [place];  Service: Vascular;  Laterality: Right;  . Revison of arteriovenous fistula Right 07/15/2014    Procedure: EXPLORATION OF ARTERIOVENOUS FISTULA;  Surgeon: Elam Dutch, MD;  Location: [place];  Service: Vascular;  Laterality: Right;  . Cardiac catheterization N/A 11/27/2014    Procedure: Right/Left Heart Cath and Coronary Angiography;  Surgeon: Burnell Blanks, MD;  Location: [place] [facility];  Service: Cardiovascular;  Laterality: N/A;      Family History  Problem Relation Age of Onset  . Cancer Father     lung  . Cancer Mother       reports that he has never smoked. He has never used smokeless tobacco. He reports that he drinks about 1.2 oz of alcohol per week. He reports that he does not use illicit drugs.  No Known  Allergies  Prior to Admission medications   Medication Sig Start Date End Date Taking? Authorizing Provider  allopurinol (ZYLOPRIM) 100 MG tablet Take 1 tablet (100 mg total) by mouth daily. 04/08/14  Yes Tiffany L Reed, DO  amLODipine (NORVASC) 10 MG tablet Take one tablet by mouth once daily to control blood pressure 07/30/14  Yes Tiffany L Reed, DO  aspirin EC 81 MG tablet Take 81 mg by mouth at bedtime.    Yes Historical Provider, MD  atorvastatin (LIPITOR) 20 MG tablet Take one tablet by mouth once daily for cholesterol 11/11/14  Yes Tiffany L Reed, DO  calcium carbonate (TUMS - DOSED IN MG ELEMENTAL CALCIUM) 500 MG chewable tablet Chew 2 tablets by mouth 3 (three) times daily  with meals.   Yes Historical Provider, MD  multivitamin (RENA-VIT) TABS tablet Take 1 tablet by mouth daily.    Yes Historical Provider, MD  omeprazole (PRILOSEC) 20 MG capsule Take 20 mg by mouth 4 (four) times a week. Sunday, Tuesday, Thursday and Saturday   Yes Historical Provider, MD  Vitamin D, Ergocalciferol, (DRISDOL) 50000 UNITS CAPS capsule Take 1 capsule (50,000 Units total) by mouth every 14 (fourteen) days. 1st and 15th of each month 12/02/14  Yes Tiffany L Reed, DO    [id number] chloride, albumin human, lactated ringers, metoprolol, midazolam, morphine injection, morphine injection, ondansetron (ZOFRAN) IV, oxyCODONE, [START ON 12/10/2014] sodium chloride, traMADol  Results for orders placed or performed during the hospital encounter of 12/09/14 (from the past 48 hour(s))  I-STAT 4, (NA,K, GLUC, HGB,HCT)     Status: Abnormal   Collection Time: 12/09/14  6:54 AM  Result Value Ref Range   Sodium 140 135 - 145 mmol/L   Potassium 4.0 3.5 - 5.1 mmol/L   Glucose, Bld 117 (H) 65 - 99 mg/dL   HCT 36.0 (L) 39.0 - 52.0 %   Hemoglobin 12.2 (L) 13.0 - 17.0 g/dL  I-STAT, chem 8     Status: Abnormal   Collection Time: 12/09/14  7:54 AM  Result Value Ref Range   Sodium 135 135 - 145 mmol/L   Potassium 3.8 3.5 - 5.1 mmol/L   Chloride 107 101 - 111 mmol/L   BUN 30 (H) 6 - 20 mg/dL   Creatinine, Ser 6.50 (H) 0.61 - 1.24 mg/dL   Glucose, Bld 123 (H) 65 - 99 mg/dL   Calcium, Ion 1.11 (L) 1.13 - 1.30 mmol/L   TCO2 30 0 - 100 mmol/L   Hemoglobin 10.2 (L) 13.0 - 17.0 g/dL   HCT 30.0 (L) 39.0 - 52.0 %  I-STAT 3, arterial blood gas (G3+)     Status: Abnormal   Collection Time: 12/09/14  7:58 AM  Result Value Ref Range   pH, Arterial 7.428 7.350 - 7.450   pCO2 arterial 45.3 (H) 35.0 - 45.0 mmHg   pO2, Arterial 355.0 (H) 80.0 - 100.0 mmHg   Bicarbonate 29.9 (H) 20.0 - 24.0 mEq/L   TCO2 31 0 - 100 mmol/L   O2 Saturation 100.0 %   Acid-Base Excess 5.0 (H) 0.0 - 2.0 mmol/L   Patient temperature  HIDE    Sample type ARTERIAL   Prepare RBC     Status: None   Collection Time: 12/09/14  7:59 AM  Result Value Ref Range   Order Confirmation ORDER PROCESSED BY BLOOD BANK   Prepare RBC (crossmatch)     Status: None   Collection Time: 12/09/14  8:19 AM  Result Value Ref Range   Order Confirmation ORDER  PROCESSED BY BLOOD BANK   I-STAT, chem 8     Status: Abnormal   Collection Time: 12/09/14  9:42 AM  Result Value Ref Range   Sodium 134 (L) 135 - 145 mmol/L   Potassium 4.0 3.5 - 5.1 mmol/L   Chloride 110 101 - 111 mmol/L   BUN 30 (H) 6 - 20 mg/dL   Creatinine, Ser 6.40 (H) 0.61 - 1.24 mg/dL   Glucose, Bld 116 (H) 65 - 99 mg/dL   Calcium, Ion 1.09 (L) 1.13 - 1.30 mmol/L   TCO2 31 0 - 100 mmol/L   Hemoglobin 10.9 (L) 13.0 - 17.0 g/dL   HCT 32.0 (L) 39.0 - 52.0 %  I-STAT, chem 8     Status: Abnormal   Collection Time: 12/09/14 10:03 AM  Result Value Ref Range   Sodium 140 135 - 145 mmol/L   Potassium 4.3 3.5 - 5.1 mmol/L   Chloride 98 (L) 101 - 111 mmol/L   BUN 28 (H) 6 - 20 mg/dL   Creatinine, Ser 5.70 (H) 0.61 - 1.24 mg/dL   Glucose, Bld 105 (H) 65 - 99 mg/dL   Calcium, Ion 1.01 (L) 1.13 - 1.30 mmol/L   TCO2 31 0 - 100 mmol/L   Hemoglobin 8.8 (L) 13.0 - 17.0 g/dL   HCT 26.0 (L) 39.0 - 52.0 %  I-STAT 3, arterial blood gas (G3+)     Status: Abnormal   Collection Time: 12/09/14 10:08 AM  Result Value Ref Range   pH, Arterial 7.435 7.350 - 7.450   pCO2 arterial 39.2 35.0 - 45.0 mmHg   pO2, Arterial 487.0 (H) 80.0 - 100.0 mmHg   Bicarbonate 26.4 (H) 20.0 - 24.0 mEq/L   TCO2 28 0 - 100 mmol/L   O2 Saturation 100.0 %   Acid-Base Excess 2.0 0.0 - 2.0 mmol/L   Patient temperature HIDE    Sample type ARTERIAL   I-STAT, chem 8     Status: Abnormal   Collection Time: 12/09/14 11:06 AM  Result Value Ref Range   Sodium 133 (L) 135 - 145 mmol/L   Potassium 5.1 3.5 - 5.1 mmol/L   Chloride 96 (L) 101 - 111 mmol/L   BUN 31 (H) 6 - 20 mg/dL   Creatinine, Ser 6.40 (H) 0.61 - 1.24  mg/dL   Glucose, Bld 106 (H) 65 - 99 mg/dL   Calcium, Ion 1.06 (L) 1.13 - 1.30 mmol/L   TCO2 31 0 - 100 mmol/L   Hemoglobin 8.2 (L) 13.0 - 17.0 g/dL   HCT 24.0 (L) 39.0 - 52.0 %  I-STAT, chem 8     Status: Abnormal   Collection Time: 12/09/14 12:02 PM  Result Value Ref Range   Sodium 135 135 - 145 mmol/L   Potassium 6.5 (HH) 3.5 - 5.1 mmol/L   Chloride 97 (L) 101 - 111 mmol/L   BUN 32 (H) 6 - 20 mg/dL   Creatinine, Ser 6.30 (H) 0.61 - 1.24 mg/dL   Glucose, Bld 115 (H) 65 - 99 mg/dL   Calcium, Ion 1.02 (L) 1.13 - 1.30 mmol/L   TCO2 35 0 - 100 mmol/L   Hemoglobin 8.2 (L) 13.0 - 17.0 g/dL   HCT 24.0 (L) 39.0 - 52.0 %  Hemoglobin and hematocrit, blood     Status: Abnormal   Collection Time: 12/09/14 12:05 PM  Result Value Ref Range   Hemoglobin 7.5 (L) 13.0 - 17.0 g/dL    Comment: REPEATED TO VERIFY DELTA CHECK NOTED CRITICAL RESULT CALLED TO, READ  BACK BY AND VERIFIED WITH: PELANCE,M RN 1216 12.5.16 MCADOO,G    HCT 23.5 (L) 39.0 - 52.0 %  Platelet count     Status: Abnormal   Collection Time: 12/09/14 12:05 PM  Result Value Ref Range   Platelets 129 (L) 150 - 400 K/uL  I-STAT, chem 8     Status: Abnormal   Collection Time: 12/09/14 12:57 PM  Result Value Ref Range   Sodium 136 135 - 145 mmol/L   Potassium 5.2 (H) 3.5 - 5.1 mmol/L   Chloride 97 (L) 101 - 111 mmol/L   BUN 34 (H) 6 - 20 mg/dL   Creatinine, Ser 6.10 (H) 0.61 - 1.24 mg/dL   Glucose, Bld 179 (H) 65 - 99 mg/dL   Calcium, Ion 1.04 (L) 1.13 - 1.30 mmol/L   TCO2 28 0 - 100 mmol/L   Hemoglobin 8.8 (L) 13.0 - 17.0 g/dL   HCT 26.0 (L) 39.0 - 52.0 %  I-STAT 3, arterial blood gas (G3+)     Status: Abnormal   Collection Time: 12/09/14  1:01 PM  Result Value Ref Range   pH, Arterial 7.458 (H) 7.350 - 7.450   pCO2 arterial 40.1 35.0 - 45.0 mmHg   pO2, Arterial 205.0 (H) 80.0 - 100.0 mmHg   Bicarbonate 28.4 (H) 20.0 - 24.0 mEq/L   TCO2 30 0 - 100 mmol/L   O2 Saturation 100.0 %   Acid-Base Excess 4.0 (H) 0.0 - 2.0  mmol/L   Patient temperature HIDE    Sample type ARTERIAL   CBC     Status: Abnormal   Collection Time: 12/09/14  2:20 PM  Result Value Ref Range   WBC 7.9 4.0 - 10.5 K/uL   RBC 3.10 (L) 4.22 - 5.81 MIL/uL   Hemoglobin 10.1 (L) 13.0 - 17.0 g/dL   HCT 33.0 (L) 39.0 - 52.0 %   MCV 106.5 (H) 78.0 - 100.0 fL   MCH 32.6 26.0 - 34.0 pg   MCHC 30.6 30.0 - 36.0 g/dL   RDW 17.5 (H) 11.5 - 15.5 %   Platelets 128 (L) 150 - 400 K/uL  Protime-INR     Status: Abnormal   Collection Time: 12/09/14  2:20 PM  Result Value Ref Range   Prothrombin Time 17.4 (H) 11.6 - 15.2 seconds   INR 1.42 0.00 - 1.49  APTT     Status: None   Collection Time: 12/09/14  2:20 PM  Result Value Ref Range   aPTT 36 24 - 37 seconds   ROS: see hpi  Physical Exam: Filed Vitals:   12/09/14 1515 12/09/14 1530  BP:  93/54  Pulse: 79   Temp: 98.8 F (37.1 C) 99 F (37.2 C)  Resp: 12 12     General: Intubated, postop elderly WM  HEENT: intubated /  Neck: = no bruit or JVD Heart: RRR , 1/6 sem RSB , no rub or gallop Lungs: CTA  bilat on vent Abdomen: soft NT,ND , BS pos. Extremities: no pedal edema Skin: no overt rash Neuro: intubated/noncommunicative currently Dialysis Access: Pos. Bruit RFA AVF  Dialysis Orders: [facility]: [facility]  on TTS . EDW 78.5 kg HD Bath 2.0k, 2.0Ca  Time 4.0 hrs Heparin 3000. Access R FA AVF     Hectorol 12 mcg IV/HD Mircera 225 q 2 wks (last on 12/05/14)  Venofer  100mg  load q hd thru 12/14/14 then 50mg  weekly thrus hd  Other op labs HGB 10.0 12/05/14 Ca 10.0 phos 5.6 pth 304  Assessment/Plan 1. CAD /  AS = SP  Dr. Cyndia Bent  CABG x3  And AOrtic Vave replacement with [facility] - Ease pericardil valve 2. ESRD - HD TTS schedule  No HD needs today/ scheule hd tomor NO HEP. / bel 3. Hypertension/volume  - Below edw by wt 77.5  ,bp ok, keep even no HD UF  4. Anemia  - hgb 10.1 pre op  Aranesp 200 thurs hd / Venofer load 100mg  q hd lasyt dose 12/14/14 5. Metabolic bone disease -   Vit d on HD /  On tums fpr Phos binder when eating   Ernest Haber, PA-C [place] [phone] 12/09/2014, 3:47 PM

## 2014-12-09 NOTE — Anesthesia Postprocedure Evaluation (Signed)
Anesthesia Post Note  Patient: Eric [name]  Procedure(s) Performed: Procedure(s) (LRB): AORTIC VALVE REPLACEMENT (AVR) WITH 23MM MAGNA EASE BIOPROSTHETIC VALVE (N/A) CORONARY ARTERY BYPASS GRAFTING (CABG), ON PUMP, TIMES THREE, USING LEFT INTERNAL MAMMARY ARTERY, RIGHT GREATER SAPHENOUS VEIN HARVESTED ENDOSCOPICALLY (N/A) TRANSESOPHAGEAL ECHOCARDIOGRAM (TEE) (N/A)  Patient location during evaluation: SICU Anesthesia Type: General Level of consciousness: sedated Pain management: pain level controlled Vital Signs Assessment: post-procedure vital signs reviewed and stable Respiratory status: patient remains intubated per anesthesia plan Cardiovascular status: stable Anesthetic complications: no    Last Vitals:  Filed Vitals:   12/09/14 0650 12/09/14 1408  BP:  101/54  Pulse: 80 80  Temp:    Resp:  10    Last Pain:  Filed Vitals:   12/09/14 1417  PainSc: 0-No pain                 Lashonta Pilling DANIEL

## 2014-12-09 NOTE — Care Management Note (Signed)
Case Management Note  Patient Details  Name: Burman Madrazo MRN: [id number] Date of Birth: 06/03/1930  Subjective/Objective:      From notes patient lives at [facility], independent living with spouse.  Daughter lives close.                Action/Plan:   Expected Discharge Date:                  Expected Discharge Plan:  Skilled Nursing Facility  In-House Referral:  Clinical Social Work  Discharge planning Services  CM Consult  Post Acute Care Choice:    Choice offered to:     DME Arranged:    DME Agency:     HH Arranged:    [place] Agency:     Status of Service:  In process, will continue to follow  Medicare Important Message Given:    Date Medicare IM Given:    Medicare IM give by:    Date Additional Medicare IM Given:    Additional Medicare Important Message give by:     If discussed at [place] of Stay Meetings, dates discussed:    Additional Comments:  Vergie Living, RN 12/09/2014, 3:35 PM

## 2014-12-10 ENCOUNTER — Inpatient Hospital Stay (HOSPITAL_COMMUNITY): Payer: Medicare Other

## 2014-12-10 ENCOUNTER — Encounter (HOSPITAL_COMMUNITY): Payer: Self-pay | Admitting: *Deleted

## 2014-12-10 LAB — POCT I-STAT 3, ART BLOOD GAS (G3+)
ACID-BASE EXCESS: 1 mmol/L (ref 0.0–2.0)
Bicarbonate: 24.7 mEq/L — ABNORMAL HIGH (ref 20.0–24.0)
O2 Saturation: 97 %
PH ART: 7.438 (ref 7.350–7.450)
PO2 ART: 87 mmHg (ref 80.0–100.0)
Patient temperature: 100.2
TCO2: 26 mmol/L (ref 0–100)
pCO2 arterial: 36.8 mmHg (ref 35.0–45.0)

## 2014-12-10 LAB — RENAL FUNCTION PANEL
ALBUMIN: 2.7 g/dL — AB (ref 3.5–5.0)
ANION GAP: 11 (ref 5–15)
BUN: 15 mg/dL (ref 6–20)
CALCIUM: 8.3 mg/dL — AB (ref 8.9–10.3)
CO2: 27 mmol/L (ref 22–32)
Chloride: 98 mmol/L — ABNORMAL LOW (ref 101–111)
Creatinine, Ser: 3.82 mg/dL — ABNORMAL HIGH (ref 0.61–1.24)
GFR calc non Af Amer: 13 mL/min — ABNORMAL LOW (ref >60)
GFR, EST AFRICAN AMERICAN: 16 mL/min — AB (ref >60)
Glucose, Bld: 129 mg/dL — ABNORMAL HIGH (ref 65–99)
PHOSPHORUS: 5.1 mg/dL — AB (ref 2.5–4.6)
POTASSIUM: 3.7 mmol/L (ref 3.5–5.1)
SODIUM: 136 mmol/L (ref 135–145)

## 2014-12-10 LAB — BASIC METABOLIC PANEL
ANION GAP: 12 (ref 5–15)
BUN: 38 mg/dL — ABNORMAL HIGH (ref 6–20)
CO2: 21 mmol/L — AB (ref 22–32)
Calcium: 8.3 mg/dL — ABNORMAL LOW (ref 8.9–10.3)
Chloride: 104 mmol/L (ref 101–111)
Creatinine, Ser: 6.94 mg/dL — ABNORMAL HIGH (ref 0.61–1.24)
GFR calc Af Amer: 8 mL/min — ABNORMAL LOW (ref >60)
GFR, EST NON AFRICAN AMERICAN: 7 mL/min — AB (ref >60)
GLUCOSE: 117 mg/dL — AB (ref 65–99)
POTASSIUM: 6.5 mmol/L — AB (ref 3.5–5.1)
Sodium: 137 mmol/L (ref 135–145)

## 2014-12-10 LAB — GLUCOSE, CAPILLARY
GLUCOSE-CAPILLARY: 104 mg/dL — AB (ref 65–99)
GLUCOSE-CAPILLARY: 67 mg/dL (ref 65–99)
GLUCOSE-CAPILLARY: 80 mg/dL (ref 65–99)
Glucose-Capillary: 107 mg/dL — ABNORMAL HIGH (ref 65–99)
Glucose-Capillary: 111 mg/dL — ABNORMAL HIGH (ref 65–99)
Glucose-Capillary: 133 mg/dL — ABNORMAL HIGH (ref 65–99)
Glucose-Capillary: 146 mg/dL — ABNORMAL HIGH (ref 65–99)
Glucose-Capillary: 71 mg/dL (ref 65–99)
Glucose-Capillary: 93 mg/dL (ref 65–99)
Glucose-Capillary: 94 mg/dL (ref 65–99)

## 2014-12-10 LAB — CBC
HEMATOCRIT: 30.9 % — AB (ref 39.0–52.0)
HEMATOCRIT: 28.9 % — AB (ref 39.0–52.0)
HEMOGLOBIN: 9.3 g/dL — AB (ref 13.0–17.0)
HEMOGLOBIN: 8.7 g/dL — AB (ref 13.0–17.0)
MCH: 32.4 pg (ref 26.0–34.0)
MCH: 32.5 pg (ref 26.0–34.0)
MCHC: 30.1 g/dL (ref 30.0–36.0)
MCHC: 30.1 g/dL (ref 30.0–36.0)
MCV: 107.7 fL — AB (ref 78.0–100.0)
MCV: 107.8 fL — AB (ref 78.0–100.0)
Platelets: 156 10*3/uL (ref 150–400)
Platelets: 133 10*3/uL — ABNORMAL LOW (ref 150–400)
RBC: 2.87 MIL/uL — AB (ref 4.22–5.81)
RBC: 2.68 MIL/uL — AB (ref 4.22–5.81)
RDW: 18.1 % — ABNORMAL HIGH (ref 11.5–15.5)
RDW: 18 % — ABNORMAL HIGH (ref 11.5–15.5)
WBC: 14.4 10*3/uL — AB (ref 4.0–10.5)
WBC: 12.1 10*3/uL — ABNORMAL HIGH (ref 4.0–10.5)

## 2014-12-10 LAB — CREATININE, SERUM
CREATININE: 3.9 mg/dL — AB (ref 0.61–1.24)
GFR calc Af Amer: 15 mL/min — ABNORMAL LOW (ref >60)
GFR calc non Af Amer: 13 mL/min — ABNORMAL LOW (ref >60)

## 2014-12-10 LAB — POCT I-STAT, CHEM 8
BUN: 16 mg/dL (ref 6–20)
CHLORIDE: 95 mmol/L — AB (ref 101–111)
CREATININE: 3.7 mg/dL — AB (ref 0.61–1.24)
Calcium, Ion: 1.1 mmol/L — ABNORMAL LOW (ref 1.13–1.30)
Glucose, Bld: 126 mg/dL — ABNORMAL HIGH (ref 65–99)
HEMATOCRIT: 30 % — AB (ref 39.0–52.0)
HEMOGLOBIN: 10.2 g/dL — AB (ref 13.0–17.0)
POTASSIUM: 3.7 mmol/L (ref 3.5–5.1)
Sodium: 138 mmol/L (ref 135–145)
TCO2: 28 mmol/L (ref 0–100)

## 2014-12-10 LAB — MAGNESIUM
Magnesium: 2.1 mg/dL (ref 1.7–2.4)
Magnesium: 1.9 mg/dL (ref 1.7–2.4)

## 2014-12-10 MED ORDER — POTASSIUM CHLORIDE 10 MEQ/50ML IV SOLN
INTRAVENOUS | Status: AC
  Filled 2014-12-10: qty 150

## 2014-12-10 MED ORDER — INSULIN ASPART 100 UNIT/ML ~~LOC~~ SOLN
0.0000 [IU] | SUBCUTANEOUS | Status: DC
  Administered 2014-12-11 – 2014-12-12 (×4): 2 [IU] via SUBCUTANEOUS

## 2014-12-10 NOTE — Progress Notes (Signed)
Inpatient Diabetes Program Recommendations  AACE/ADA: New Consensus Statement on Inpatient Glycemic Control (2015)  Target Ranges:  Prepandial:   less than 140 mg/dL      Peak postprandial:   less than 180 mg/dL (1-2 hours)      Critically ill patients:  140 - 180 mg/dL   Review of Glycemic Control  No hx DM Mild hypoglycemia early am. On Novolog 0-24 units Q4H.  Results for RUSTIN, MCGONIGLE (MRN [id number]) as of 12/10/2014 12:02  Ref. Range 12/09/2014 21:16 12/10/2014 00:01 12/10/2014 00:03 12/10/2014 04:05 12/10/2014 08:00  Glucose-Capillary Latest Ref Range: 65-99 mg/dL 80 67 71 107 (H) 94    Inpatient Diabetes Program Recommendations:    Consider decreasing Novolog to sensitive Q4H.  Will continue to follow. Thank you. Lorenda Peck, RD, LDN, CDE Inpatient Diabetes Coordinator [phone]

## 2014-12-10 NOTE — Progress Notes (Signed)
      [address]       [place] [address]             [phone]      POD # 1 AVR CABG  Had HD today  BP 106/58 mmHg  Pulse 79  Temp(Src) 98 F (36.7 C) (Oral)  Resp 18  Ht 5' 8.5" (1.74 m)  Wt 185 lb 3 oz (84 kg)  BMI 27.74 kg/m2  SpO2 100%   Intake/Output Summary (Last 24 hours) at 12/10/14 1723 Last data filed at 12/10/14 1400  Gross per 24 hour  Intake 2383.62 ml  Output    620 ml  Net 1763.62 ml   K= 3.7 post HD  Charvis Lightner C. Roxan Hockey, MD [facility] [phone]

## 2014-12-10 NOTE — Progress Notes (Signed)
[facility] ROUNDING NOTE   Subjective:   Interval History: Awake and alert this morning some pain over chest wall incision but controlled  Blood pressure a little soft this morning Post Op Day 1  Objective:  Vital signs in last 24 hours:  Temp:  [98.4 F (36.9 C)-100.2 F (37.9 C)] 98.6 F (37 C) (12/06 0830) Pulse Rate:  [25-80] 79 (12/06 0830) Resp:  [10-26] 15 (12/06 0830) BP: (83-108)/(40-67) 83/42 mmHg (12/06 0829) SpO2:  [96 %-100 %] 100 % (12/06 0830) Arterial Line BP: (81-118)/(42-68) 84/42 mmHg (12/06 0830) FiO2 (%):  [40 %-50 %] 40 % (12/05 2118) Weight:  [79.1 kg (174 lb 6.1 oz)] 79.1 kg (174 lb 6.1 oz) (12/06 0609)  Weight change: 1.535 kg (3 lb 6.1 oz) Filed Weights   12/09/14 0619 12/10/14 0500 12/10/14 0609  Weight: 77.565 kg (171 lb) 79.1 kg (174 lb 6.1 oz) 79.1 kg (174 lb 6.1 oz)    Intake/Output: I/O last 3 completed shifts: In: 3259.7 [P.O.:210; I.V.:2171.7; Blood:448; NG/GT:30; IV [id number] Out: 2100 [Urine:910; Emesis/NG output:100; Blood:800; Chest Tube:290]   Intake/Output this shift:  Total I/O In: 70 [I.V.:70] Out: 40 [Urine:40]  General:  Oxygen nasal canula   HEENT:  Right swann cordis  Neck: = no bruit or JVD Heart: RRR , 1/6 sem RSB , no rub or gallop Lungs: CTA  Anterior lung with R chest tube Abdomen: soft NT,ND , BS pos. Extremities: no pedal edema Skin: no overt rash Neuro: intubated/noncommunicative currently Dialysis Access: Pos. Bruit RFA AVF   Basic Metabolic Panel:  Recent Labs Lab 12/06/14 0911  12/09/14 1106 12/09/14 1202 12/09/14 1257 12/09/14 1422 12/09/14 2030 12/09/14 2031 12/10/14 0400  NA 139  < > 133* 135 136 138  --  138 137  K 4.3  < > 5.1 6.5* 5.2* 4.4  --  4.8 6.5*  CL 102  < > 96* 97* 97*  --   --  104 104  CO2 24  --   --   --   --   --   --   --  21*  GLUCOSE 108*  < > 106* 115* 179* 122*  --  105* 117*  BUN 23*  < > 31* 32* 34*  --   --  33* 38*  CREATININE 5.40*  < > 6.40*  6.30* 6.10*  --  6.56* 6.60* 6.94*  CALCIUM 9.6  --   --   --   --   --   --   --  8.3*  MG  --   --   --   --   --   --  2.1  --  2.1  < > = values in this interval not displayed.  Liver Function Tests:  Recent Labs Lab 12/06/14 0911  AST 13*  ALT 13*  ALKPHOS 64  BILITOT 0.5  PROT 7.3  ALBUMIN 3.6   No results for input(s): LIPASE, AMYLASE in the last 168 hours. No results for input(s): AMMONIA in the last 168 hours.  CBC:  Recent Labs Lab 12/06/14 0911  12/09/14 1205  12/09/14 1420 12/09/14 1422 12/09/14 2030 12/09/14 2031 12/10/14 0400  WBC 7.4  --   --   --  7.9  --  16.1*  --  14.4*  HGB 10.7*  < > 7.5*  < > 10.1* 10.9* 10.2* 10.9* 9.3*  HCT 33.8*  < > 23.5*  < > 33.0* 32.0* 33.1* 32.0* 30.9*  MCV 103.7*  --   --   --  106.5*  --  106.1*  --  107.7*  PLT 197  --  129*  --  128*  --  176  --  156  < > = values in this interval not displayed.  Cardiac Enzymes: No results for input(s): CKTOTAL, CKMB, CKMBINDEX, TROPONINI in the last 168 hours.  BNP: Invalid input(s): POCBNP  CBG:  Recent Labs Lab 12/09/14 2116 12/10/14 0001 12/10/14 0003 12/10/14 0405 12/10/14 0800  GLUCAP 80 67 71 107* 26    Microbiology: Results for orders placed or performed during the hospital encounter of 12/06/14  Surgical pcr screen     Status: None   Collection Time: 12/06/14  9:20 AM  Result Value Ref Range Status   MRSA, PCR NEGATIVE NEGATIVE Final   Staphylococcus aureus NEGATIVE NEGATIVE Final    Comment:        The Xpert SA Assay (FDA approved for NASAL specimens in patients over 61 years of age), is one component of a comprehensive surveillance program.  Test performance has been validated by [facility] for patients greater than or equal to 50 year old. It is not intended to diagnose infection nor to guide or monitor treatment.     Coagulation Studies:  Recent Labs  12/09/14 1420  LABPROT 17.4*  INR 1.42    Urinalysis: No results for input(s):  COLORURINE, LABSPEC, PHURINE, GLUCOSEU, HGBUR, BILIRUBINUR, KETONESUR, PROTEINUR, UROBILINOGEN, NITRITE, LEUKOCYTESUR in the last 72 hours.  Invalid input(s): APPERANCEUR    Imaging: Dg Chest Port 1 View  12/10/2014  CLINICAL DATA:  Status post CABG on December 09, 2014 EXAM: PORTABLE CHEST 1 VIEW COMPARISON:  Portable chest x-ray of December 09, 2014. FINDINGS: There has been interval extubation of the trachea and of the esophagus. The lungs are adequately inflated. A small amount of left lower lobe atelectasis and small left pleural effusion remain. There is no pneumothorax. The cardiac silhouette remains enlarged. The pulmonary vascularity is not engorged. The Swan-Ganz catheter tip projects in the proximal main pulmonary outflow tract. The left-sided chest tubes and the mediastinal drain are in stable position. There are 7 intact sternal wires. IMPRESSION: Good aeration of both lungs following extubation. There is persistent minimal left lower lobe atelectasis and trace left pleural effusion. There is no pneumothorax. Stable enlargement of cardiac silhouette without pulmonary vascular congestion. Electronically Signed   By: David  Martinique M.D.   On: 12/10/2014 07:34   Dg Chest Port 1 View  12/09/2014  CLINICAL DATA:  Post CABG EXAM: PORTABLE CHEST 1 VIEW COMPARISON:  12/06/2014 FINDINGS: Changes of CABG. Endotracheal tube is 7 cm above the carina. Swan-Ganz catheter is in the main pulmonary artery. NG tube enters the stomach. Left chest tube in place. No pneumothorax. Left base atelectasis. Right lung is clear. IMPRESSION: Changes of CABG. Support devices in expected position. No pneumothorax. Left base atelectasis. Electronically Signed   By: Rolm Baptise M.D.   On: 12/09/2014 14:54     Medications:   . sodium chloride 20 mL/hr at 12/10/14 0400  . sodium chloride    . sodium chloride 20 mL/hr at 12/09/14 1500  . sodium chloride 20 mL/hr at 12/10/14 0400  . dexmedetomidine Stopped (12/09/14  1800)  . insulin (NOVOLIN-R) infusion Stopped (12/09/14 2101)  . nitroGLYCERIN    . phenylephrine (NEO-SYNEPHRINE) Adult infusion 50 mcg/min (12/10/14 0831)   . acetaminophen  1,000 mg Oral 4 times per day   Or  . acetaminophen (TYLENOL) oral liquid 160 mg/5 mL  1,000 mg Per Tube 4  times per day  . antiseptic oral rinse  7 mL Mouth Rinse QID  . aspirin EC  325 mg Oral Daily   Or  . aspirin  324 mg Per Tube Daily  . atorvastatin  20 mg Oral q1800  . bisacodyl  10 mg Oral Daily   Or  . bisacodyl  10 mg Rectal Daily  . cefUROXime (ZINACEF)  IV  1.5 g Intravenous Q24H  . chlorhexidine gluconate  15 mL Mouth Rinse BID  . docusate sodium  200 mg Oral Daily  . insulin aspart  0-24 Units Subcutaneous 6 times per day  . insulin aspart  0-24 Units Subcutaneous 6 times per day  . insulin regular  0-10 Units Intravenous TID WC  . magnesium sulfate  4 g Intravenous Once  . pantoprazole  40 mg Oral Daily  . sodium chloride  3 mL Intravenous Q12H   sodium chloride, albumin human, lactated ringers, metoprolol, morphine injection, ondansetron (ZOFRAN) IV, oxyCODONE, sodium chloride, traMADol  Assessment/ Plan:  Dialysis Orders: Center: [facility] on TTS . EDW 78.5 kg HD Bath 2.0k, 2.0Ca Time 4.0 hrs Heparin 3000. Access R FA AVF  Hectorol 12 mcg IV/HD Mircera 225 q 2 wks (last on 12/05/14) Venofer 100mg  load q hd thru 12/14/14 then 50mg  weekly thrus hd  Other op labs HGB 10.0 12/05/14 Ca 10.0 phos 5.6 pth 304  Assessment/Plan 1. CAD / AS = SP Dr. Cyndia Bent CABG x3 And AOrtic Vave replacement with [facility] - Ease pericardil valve 2. ESRD - HD TTS schedule K 6.5  Will use 1 K bath and will recheck K later 3. Hypertension/volume - Below edw by wt 77.5 ,bp ok, keep even on HD UF Neosynephrine titrated 4. Anemia - hgb  9's   pre op Aranesp 200 thurs hd / Venofer load 100mg  q hd lasyt dose 12/14/14 5. Metabolic bone disease - Vit d on HD / On tums fpr Phos binder when eating    LOS:  1 Eric [name] W @TODAY @8 :31 AM

## 2014-12-10 NOTE — Progress Notes (Signed)
1 Day Post-Op Procedure(s) (LRB): AORTIC VALVE REPLACEMENT (AVR) WITH 23MM MAGNA EASE BIOPROSTHETIC VALVE (N/A) CORONARY ARTERY BYPASS GRAFTING (CABG), ON PUMP, TIMES THREE, USING LEFT INTERNAL MAMMARY ARTERY, RIGHT GREATER SAPHENOUS VEIN HARVESTED ENDOSCOPICALLY (N/A) TRANSESOPHAGEAL ECHOCARDIOGRAM (TEE) (N/A) Subjective: Chest is sore.  Objective: Vital signs in last 24 hours: Temp:  [98.8 F (37.1 C)-100.2 F (37.9 C)] 98.8 F (37.1 C) (12/06 0730) Pulse Rate:  [25-80] 79 (12/06 0730) Cardiac Rhythm:  [-] Atrial paced (12/06 0725) Resp:  [10-26] 16 (12/06 0730) BP: (87-108)/(40-67) 96/57 mmHg (12/06 0700) SpO2:  [96 %-100 %] 100 % (12/06 0730) Arterial Line BP: (81-118)/(44-68) 105/48 mmHg (12/06 0730) FiO2 (%):  [40 %-50 %] 40 % (12/05 2118) Weight:  [79.1 kg (174 lb 6.1 oz)] 79.1 kg (174 lb 6.1 oz) (12/06 0609)  Hemodynamic parameters for last 24 hours: PAP: (18-33)/(4-18) 22/8 mmHg CO:  [3.2 L/min-4.5 L/min] 4.5 L/min CI:  [1.7 L/min/m2-2.3 L/min/m2] 2.3 L/min/m2  Intake/Output from previous day: 12/05 0701 - 12/06 0700 In: 3259.7 [P.O.:210; I.V.:2171.7; Blood:448; NG/GT:30; IV Piggyback:400] Out: 2100 [Urine:910; Emesis/NG output:100; Blood:800; Chest Tube:290] Intake/Output this shift:    General appearance: alert and cooperative Neurologic: intact Heart: regular rate and rhythm, S1, S2 normal, no murmur, click, rub or gallop Lungs: clear to auscultation bilaterally Abdomen: soft, non-tender; bowel sounds normal; no masses,  no organomegaly Extremities: edema mild Wound: dressings dry  Lab Results:  Recent Labs  12/09/14 2030 12/09/14 2031 12/10/14 0400  WBC 16.1*  --  14.4*  HGB 10.2* 10.9* 9.3*  HCT 33.1* 32.0* 30.9*  PLT 176  --  156   BMET:  Recent Labs  12/09/14 2031 12/10/14 0400  NA 138 137  K 4.8 6.5*  CL 104 104  CO2  --  21*  GLUCOSE 105* 117*  BUN 33* 38*  CREATININE 6.60* 6.94*  CALCIUM  --  8.3*    PT/INR:  Recent Labs  12/09/14 1420  LABPROT 17.4*  INR 1.42   ABG    Component Value Date/Time   PHART 7.438 12/10/2014 0006   HCO3 24.7* 12/10/2014 0006   TCO2 26 12/10/2014 0006   ACIDBASEDEF 1.0 12/09/2014 2256   O2SAT 97.0 12/10/2014 0006   CBG (last 3)   Recent Labs  12/10/14 0001 12/10/14 0003 12/10/14 0405  GLUCAP 67 71 107*   CXR : ok  Assessment/Plan: S/P Procedure(s) (LRB): AORTIC VALVE REPLACEMENT (AVR) WITH 23MM MAGNA EASE BIOPROSTHETIC VALVE (N/A) CORONARY ARTERY BYPASS GRAFTING (CABG), ON PUMP, TIMES THREE, USING LEFT INTERNAL MAMMARY ARTERY, RIGHT GREATER SAPHENOUS VEIN HARVESTED ENDOSCOPICALLY (N/A) TRANSESOPHAGEAL ECHOCARDIOGRAM (TEE) (N/A)  He is hemodynamically stable but still on neo 40 mcg. K+ is high this am so getting ready to start dialysis. Will wean neo after dialysis as tolerated. Hold off on beta blocker. DC chest tubes later after dangle. Mobilize.   LOS: 1 day    Eric [name] 12/10/2014

## 2014-12-10 NOTE — Procedures (Signed)
I have seen and examined this patient and agree with the plan of care. No complaints seen on dialysis  Hemodynamic parameters reviewed   CO  And CI  Good     24mcg Neosynephrine     K - 6.5   Will run on a 1 K bath     Eric [name] W 12/10/2014, 8:29 AM

## 2014-12-11 ENCOUNTER — Inpatient Hospital Stay (HOSPITAL_COMMUNITY): Payer: Medicare Other

## 2014-12-11 LAB — GLUCOSE, CAPILLARY
GLUCOSE-CAPILLARY: 131 mg/dL — AB (ref 65–99)
GLUCOSE-CAPILLARY: 100 mg/dL — AB (ref 65–99)
Glucose-Capillary: 121 mg/dL — ABNORMAL HIGH (ref 65–99)
Glucose-Capillary: 125 mg/dL — ABNORMAL HIGH (ref 65–99)
Glucose-Capillary: 126 mg/dL — ABNORMAL HIGH (ref 65–99)

## 2014-12-11 LAB — BASIC METABOLIC PANEL
Anion gap: 10 (ref 5–15)
BUN: 24 mg/dL — ABNORMAL HIGH (ref 6–20)
CALCIUM: 8 mg/dL — AB (ref 8.9–10.3)
CO2: 28 mmol/L (ref 22–32)
CREATININE: 4.83 mg/dL — AB (ref 0.61–1.24)
Chloride: 96 mmol/L — ABNORMAL LOW (ref 101–111)
GFR calc non Af Amer: 10 mL/min — ABNORMAL LOW (ref >60)
GFR, EST AFRICAN AMERICAN: 12 mL/min — AB (ref >60)
GLUCOSE: 159 mg/dL — AB (ref 65–99)
Potassium: 3.7 mmol/L (ref 3.5–5.1)
Sodium: 134 mmol/L — ABNORMAL LOW (ref 135–145)

## 2014-12-11 LAB — CBC
HCT: 27.4 % — ABNORMAL LOW (ref 39.0–52.0)
Hemoglobin: 8.3 g/dL — ABNORMAL LOW (ref 13.0–17.0)
MCH: 32.8 pg (ref 26.0–34.0)
MCHC: 30.3 g/dL (ref 30.0–36.0)
MCV: 108.3 fL — ABNORMAL HIGH (ref 78.0–100.0)
PLATELETS: 132 10*3/uL — AB (ref 150–400)
RBC: 2.53 MIL/uL — AB (ref 4.22–5.81)
RDW: 17.7 % — AB (ref 11.5–15.5)
WBC: 10.4 10*3/uL (ref 4.0–10.5)

## 2014-12-11 NOTE — Progress Notes (Signed)
Patient ID: Eric [name], male   DOB: 1930/09/08, 81 y.o.   MRN: [id number]  SICU Evening Rounds:  He remains on neo 20 mcg.  Ambulated well today.  Foley is out and passing urine.

## 2014-12-11 NOTE — Progress Notes (Signed)
[facility] ROUNDING NOTE   Subjective:   Interval History: feels weak this morning  Objective:  Vital signs in last 24 hours:  Temp:  [97.7 F (36.5 C)-99 F (37.2 C)] 97.7 F (36.5 C) (12/07 0900) Pulse Rate:  [77-84] 80 (12/07 1000) Resp:  [10-26] 15 (12/07 1000) BP: (72-118)/(44-68) 100/58 mmHg (12/07 0945) SpO2:  [94 %-100 %] 98 % (12/07 1000) Arterial Line BP: (101-132)/(43-54) 117/50 mmHg (12/06 1700) Weight:  [81.2 kg (179 lb 0.2 oz)-84 kg (185 lb 3 oz)] 81.2 kg (179 lb 0.2 oz) (12/07 0500)  Weight change: 4.9 kg (10 lb 12.8 oz) Filed Weights   12/10/14 0609 12/10/14 1245 12/11/14 0500  Weight: 79.1 kg (174 lb 6.1 oz) 84 kg (185 lb 3 oz) 81.2 kg (179 lb 0.2 oz)    Intake/Output: I/O last 3 completed shifts: In: 3686.5 [P.O.:1170; I.V.:2086.5; NG/GT:30; IV Piggyback:400] Out: 895 [Urine:765; Chest Tube:130]   Intake/Output this shift:  Total I/O In: 204.3 [P.O.:120; I.V.:84.3] Out: 24 [Urine:30]  CVS- RRR RS- CTA diminished at bases ABD- BS present soft non-distended EXT- no edema   Basic Metabolic Panel:  Recent Labs Lab 12/06/14 0911  12/09/14 2030 12/09/14 2031 12/10/14 0400 12/10/14 1630 12/10/14 1636 12/11/14 0450  NA 139  < >  --  138 137 136 138 134*  K 4.3  < >  --  4.8 6.5* 3.7 3.7 3.7  CL 102  < >  --  104 104 98* 95* 96*  CO2 24  --   --   --  21* 27  --  28  GLUCOSE 108*  < >  --  105* 117* 129* 126* 159*  BUN 23*  < >  --  33* 38* 15 16 24*  CREATININE 5.40*  < > 6.56* 6.60* 6.94* 3.90*  3.82* 3.70* 4.83*  CALCIUM 9.6  --   --   --  8.3* 8.3*  --  8.0*  MG  --   --  2.1  --  2.1 1.9  --   --   PHOS  --   --   --   --   --  5.1*  --   --   < > = values in this interval not displayed.  Liver Function Tests:  Recent Labs Lab 12/06/14 0911 12/10/14 1630  AST 13*  --   ALT 13*  --   ALKPHOS 64  --   BILITOT 0.5  --   PROT 7.3  --   ALBUMIN 3.6 2.7*   No results for input(s): LIPASE, AMYLASE in the last 168  hours. No results for input(s): AMMONIA in the last 168 hours.  CBC:  Recent Labs Lab 12/09/14 1420  12/09/14 2030 12/09/14 2031 12/10/14 0400 12/10/14 1630 12/10/14 1636 12/11/14 0450  WBC 7.9  --  16.1*  --  14.4* 12.1*  --  10.4  HGB 10.1*  < > 10.2* 10.9* 9.3* 8.7* 10.2* 8.3*  HCT 33.0*  < > 33.1* 32.0* 30.9* 28.9* 30.0* 27.4*  MCV 106.5*  --  106.1*  --  107.7* 107.8*  --  108.3*  PLT 128*  --  176  --  156 133*  --  132*  < > = values in this interval not displayed.  Cardiac Enzymes: No results for input(s): CKTOTAL, CKMB, CKMBINDEX, TROPONINI in the last 168 hours.  BNP: Invalid input(s): POCBNP  CBG:  Recent Labs Lab 12/10/14 1556 12/10/14 1914 12/10/14 2338 12/11/14 0332 12/11/14 0853  GLUCAP 104*  133* 146* 131* 121*    Microbiology: Results for orders placed or performed during the hospital encounter of 12/06/14  Surgical pcr screen     Status: None   Collection Time: 12/06/14  9:20 AM  Result Value Ref Range Status   MRSA, PCR NEGATIVE NEGATIVE Final   Staphylococcus aureus NEGATIVE NEGATIVE Final    Comment:        The Xpert SA Assay (FDA approved for NASAL specimens in patients over 17 years of age), is one component of a comprehensive surveillance program.  Test performance has been validated by [facility] for patients greater than or equal to 90 year old. It is not intended to diagnose infection nor to guide or monitor treatment.     Coagulation Studies:  Recent Labs  12/09/14 1420  LABPROT 17.4*  INR 1.42    Urinalysis: No results for input(s): COLORURINE, LABSPEC, PHURINE, GLUCOSEU, HGBUR, BILIRUBINUR, KETONESUR, PROTEINUR, UROBILINOGEN, NITRITE, LEUKOCYTESUR in the last 72 hours.  Invalid input(s): APPERANCEUR    Imaging: Dg Chest Port 1 View  12/11/2014  CLINICAL DATA:  CABG. EXAM: PORTABLE CHEST 1 VIEW COMPARISON:  12/10/2014. FINDINGS: Interim removal Swan-Ganz catheter, mediastinal drainage catheters, left chest  tube. Right IJ sheath in stable position. Prior CABG and cardiac valve replacement. Stable cardiomegaly. No pulmonary venous congestion. Low lung volumes with bibasilar atelectasis and/or infiltrates/edema. Small pleural effusions. No pneumothorax. IMPRESSION: 1. Interim removal Swan-Ganz catheter, mediastinal drainage catheters, left chest tube. Right IJ sheath in stable position. No pneumothorax. 2. Prior CABG and cardiac valve replacement.  Stable cardiomegaly. 3. Lung volumes with bibasilar atelectasis and/or infiltrates/edema. Small bilateral pleural effusions. Electronically Signed   By: Marcello Moores  Register   On: 12/11/2014 07:29   Dg Chest Port 1 View  12/10/2014  CLINICAL DATA:  Status post CABG on December 09, 2014 EXAM: PORTABLE CHEST 1 VIEW COMPARISON:  Portable chest x-ray of December 09, 2014. FINDINGS: There has been interval extubation of the trachea and of the esophagus. The lungs are adequately inflated. A small amount of left lower lobe atelectasis and small left pleural effusion remain. There is no pneumothorax. The cardiac silhouette remains enlarged. The pulmonary vascularity is not engorged. The Swan-Ganz catheter tip projects in the proximal main pulmonary outflow tract. The left-sided chest tubes and the mediastinal drain are in stable position. There are 7 intact sternal wires. IMPRESSION: Good aeration of both lungs following extubation. There is persistent minimal left lower lobe atelectasis and trace left pleural effusion. There is no pneumothorax. Stable enlargement of cardiac silhouette without pulmonary vascular congestion. Electronically Signed   By: David  Martinique M.D.   On: 12/10/2014 07:34   Dg Chest Port 1 View  12/09/2014  CLINICAL DATA:  Post CABG EXAM: PORTABLE CHEST 1 VIEW COMPARISON:  12/06/2014 FINDINGS: Changes of CABG. Endotracheal tube is 7 cm above the carina. Swan-Ganz catheter is in the main pulmonary artery. NG tube enters the stomach. Left chest tube in place. No  pneumothorax. Left base atelectasis. Right lung is clear. IMPRESSION: Changes of CABG. Support devices in expected position. No pneumothorax. Left base atelectasis. Electronically Signed   By: Rolm Baptise M.D.   On: 12/09/2014 14:54     Medications:   . sodium chloride 20 mL/hr at 12/10/14 1300  . sodium chloride    . sodium chloride 20 mL/hr at 12/09/14 1500  . sodium chloride 20 mL/hr at 12/11/14 0800  . phenylephrine (NEO-SYNEPHRINE) Adult infusion 25 mcg/min (12/11/14 0849)   . acetaminophen  1,000 mg Oral  4 times per day   Or  . acetaminophen (TYLENOL) oral liquid 160 mg/5 mL  1,000 mg Per Tube 4 times per day  . aspirin EC  325 mg Oral Daily   Or  . aspirin  324 mg Per Tube Daily  . atorvastatin  20 mg Oral q1800  . bisacodyl  10 mg Oral Daily   Or  . bisacodyl  10 mg Rectal Daily  . docusate sodium  200 mg Oral Daily  . insulin aspart  0-24 Units Subcutaneous 6 times per day  . magnesium sulfate  4 g Intravenous Once  . pantoprazole  40 mg Oral Daily  . sodium chloride  3 mL Intravenous Q12H   sodium chloride, lactated ringers, metoprolol, morphine injection, ondansetron (ZOFRAN) IV, oxyCODONE, sodium chloride, traMADol  Assessment/ Plan:  Dialysis Orders: Center: [facility] on T[facility] . EDW 78.5 kg HD Bath 2.0k, 2.0Ca Time 4.0 hrs Heparin 3000. Access R FA AVF  Hectorol 12 mcg IV/HD Mircera 225 q 2 wks (last on 12/05/14) Venofer 100mg  load q hd thru 12/14/14 then 50mg  weekly thrus hd  Other op labs HGB 10.0 12/05/14 Ca 10.0 phos 5.6 pth 304  Assessment/Plan 1. CAD / AS = SP Dr. Cyndia Bent CABG x3 And AOrtic Vave replacement with [facility] - Ease pericardil valve 2. ESRD - HD TTS schedule K 3.7  3. Hypertension/volume - Below edw by wt 77.5 ,bp ok,  UF 1- 2 L in AM 4. Anemia - hgb 9's pre op Aranesp 200 thurs hd / Venofer load 100mg  q hd lasyt dose 12/14/14 5. Metabolic bone disease - Vit d on HD / On tums fpr Phos binder when eating    LOS:  2 Deanette Tullius W @TODAY @10 :08 AM

## 2014-12-11 NOTE — Progress Notes (Signed)
2 Days Post-Op Procedure(s) (LRB): AORTIC VALVE REPLACEMENT (AVR) WITH 23MM MAGNA EASE BIOPROSTHETIC VALVE (N/A) CORONARY ARTERY BYPASS GRAFTING (CABG), ON PUMP, TIMES THREE, USING LEFT INTERNAL MAMMARY ARTERY, RIGHT GREATER SAPHENOUS VEIN HARVESTED ENDOSCOPICALLY (N/A) TRANSESOPHAGEAL ECHOCARDIOGRAM (TEE) (N/A) Subjective:  Sore and feels weak. Sitting up in chair reading his [place].  Objective: Vital signs in last 24 hours: Temp:  [97.9 F (36.6 C)-99 F (37.2 C)] 98.2 F (36.8 C) (12/07 0350) Pulse Rate:  [77-84] 78 (12/07 0715) Cardiac Rhythm:  [-] Atrial paced (12/07 0400) Resp:  [10-26] 14 (12/07 0715) BP: (72-118)/(42-70) 105/56 mmHg (12/07 0700) SpO2:  [98 %-100 %] 98 % (12/07 0715) Arterial Line BP: (84-132)/(42-54) 117/50 mmHg (12/06 1700) Weight:  [81.2 kg (179 lb 0.2 oz)-84 kg (185 lb 3 oz)] 81.2 kg (179 lb 0.2 oz) (12/07 0500)  Hemodynamic parameters for last 24 hours: PAP: (16-26)/(5-14) 26/14 mmHg  Intake/Output from previous day: 12/06 0701 - 12/07 0700 In: 2219.2 [P.O.:960; I.V.:1209.2; IV Piggyback:50] Out: 490 [Urine:470; Chest Tube:20] Intake/Output this shift:    General appearance: alert and cooperative Neurologic: intact Heart: regular rate and rhythm, S1, S2 normal, no murmur, click, rub or gallop Lungs: diminished breath sounds bibasilar Extremities: edema mild Wound: dressing dry  Lab Results:  Recent Labs  12/10/14 1630 12/10/14 1636 12/11/14 0450  WBC 12.1*  --  10.4  HGB 8.7* 10.2* 8.3*  HCT 28.9* 30.0* 27.4*  PLT 133*  --  132*   BMET:  Recent Labs  12/10/14 1630 12/10/14 1636 12/11/14 0450  NA 136 138 134*  K 3.7 3.7 3.7  CL 98* 95* 96*  CO2 27  --  28  GLUCOSE 129* 126* 159*  BUN 15 16 24*  CREATININE 3.90*  3.82* 3.70* 4.83*  CALCIUM 8.3*  --  8.0*    PT/INR:  Recent Labs  12/09/14 1420  LABPROT 17.4*  INR 1.42   ABG    Component Value Date/Time   PHART 7.438 12/10/2014 0006   HCO3 24.7* 12/10/2014 0006   TCO2 28 12/10/2014 1636   ACIDBASEDEF 1.0 12/09/2014 2256   O2SAT 97.0 12/10/2014 0006   CBG (last 3)   Recent Labs  12/10/14 1914 12/10/14 2338 12/11/14 0332  GLUCAP 133* 146* 131*   CLINICAL DATA: CABG.  EXAM: PORTABLE CHEST 1 VIEW  COMPARISON: 12/10/2014.  FINDINGS: Interim removal Swan-Ganz catheter, mediastinal drainage catheters, left chest tube. Right IJ sheath in stable position. Prior CABG and cardiac valve replacement. Stable cardiomegaly. No pulmonary venous congestion. Low lung volumes with bibasilar atelectasis and/or infiltrates/edema. Small pleural effusions. No pneumothorax.  IMPRESSION: 1. Interim removal Swan-Ganz catheter, mediastinal drainage catheters, left chest tube. Right IJ sheath in stable position. No pneumothorax. 2. Prior CABG and cardiac valve replacement. Stable cardiomegaly. 3. Lung volumes with bibasilar atelectasis and/or infiltrates/edema. Small bilateral pleural effusions.   Electronically Signed  By: Marcello Moores Register  On: 12/11/2014 07:29   Assessment/Plan: S/P Procedure(s) (LRB): AORTIC VALVE REPLACEMENT (AVR) WITH 23MM MAGNA EASE BIOPROSTHETIC VALVE (N/A) CORONARY ARTERY BYPASS GRAFTING (CABG), ON PUMP, TIMES THREE, USING LEFT INTERNAL MAMMARY ARTERY, RIGHT GREATER SAPHENOUS VEIN HARVESTED ENDOSCOPICALLY (N/A) TRANSESOPHAGEAL ECHOCARDIOGRAM (TEE) (N/A)  He is doing well overall. Still on neo at 30 this am so will wean as tolerated. No beta blocker at this time.  Volume excess with weight 8 lbs over preop. Will have to wait until off neo to remove volume with HD.  Expected postop acute blood loss anemia  Work on IS and ambulate.  DC foley.  LOS: 2 days    Gaye Pollack 12/11/2014

## 2014-12-12 LAB — GLUCOSE, CAPILLARY
GLUCOSE-CAPILLARY: 100 mg/dL — AB (ref 65–99)
Glucose-Capillary: 153 mg/dL — ABNORMAL HIGH (ref 65–99)
Glucose-Capillary: 86 mg/dL (ref 65–99)
Glucose-Capillary: 91 mg/dL (ref 65–99)

## 2014-12-12 MED ORDER — DARBEPOETIN ALFA 100 MCG/0.5ML IJ SOSY
100.0000 ug | PREFILLED_SYRINGE | INTRAMUSCULAR | Status: DC
  Administered 2014-12-13: 100 ug via INTRAVENOUS
  Filled 2014-12-12 (×3): qty 0.5

## 2014-12-12 MED ORDER — TRAMADOL HCL 50 MG PO TABS: 50.0000 mg | ORAL_TABLET | Freq: Two times a day (BID) | ORAL | Status: DC | PRN

## 2014-12-12 NOTE — Care Management Important Message (Signed)
Important Message  Patient Details  Name: Eric [name] MRN: [id number] Date of Birth: 11-03-1930   Medicare Important Message Given:  Yes    Nathen May 12/12/2014, 2:02 PM

## 2014-12-12 NOTE — Progress Notes (Signed)
CT Surgery pm Rounds  Stable day off neo since 0830 HD planned at bedside this pm Nsr, ambulating well

## 2014-12-12 NOTE — Progress Notes (Signed)
[facility] ROUNDING NOTE   Subjective:   Interval History:  Constipated this morning no shortness of breath   Objective:  Vital signs in last 24 hours:  Temp:  [97.8 F (36.6 C)-98.1 F (36.7 C)] 97.9 F (36.6 C) (12/08 0802) Pulse Rate:  [42-91] 69 (12/08 0900) Resp:  [10-25] 13 (12/08 0900) BP: (81-134)/(48-68) 109/57 mmHg (12/08 0900) SpO2:  [88 %-100 %] 99 % (12/08 0900) Weight:  [81.4 kg (179 lb 7.3 oz)] 81.4 kg (179 lb 7.3 oz) (12/08 0500)  Weight change: -2.6 kg (-5 lb 11.7 oz) Filed Weights   12/10/14 1245 12/11/14 0500 12/12/14 0500  Weight: 84 kg (185 lb 3 oz) 81.2 kg (179 lb 0.2 oz) 81.4 kg (179 lb 7.3 oz)    Intake/Output: I/O last 3 completed shifts: In: 1917.4 [P.O.:600; I.V.:1317.4] Out: 495 [Urine:495]   Intake/Output this shift:  Total I/O In: 40 [I.V.:40] Out: -   CVS- RRR RS- CTA ABD- BS present soft non-distended EXT- no edema   Basic Metabolic Panel:  Recent Labs Lab 12/06/14 0911  12/09/14 2030 12/09/14 2031 12/10/14 0400 12/10/14 1630 12/10/14 1636 12/11/14 0450  NA 139  < >  --  138 137 136 138 134*  K 4.3  < >  --  4.8 6.5* 3.7 3.7 3.7  CL 102  < >  --  104 104 98* 95* 96*  CO2 24  --   --   --  21* 27  --  28  GLUCOSE 108*  < >  --  105* 117* 129* 126* 159*  BUN 23*  < >  --  33* 38* 15 16 24*  CREATININE 5.40*  < > 6.56* 6.60* 6.94* 3.90*  3.82* 3.70* 4.83*  CALCIUM 9.6  --   --   --  8.3* 8.3*  --  8.0*  MG  --   --  2.1  --  2.1 1.9  --   --   PHOS  --   --   --   --   --  5.1*  --   --   < > = values in this interval not displayed.  Liver Function Tests:  Recent Labs Lab 12/06/14 0911 12/10/14 1630  AST 13*  --   ALT 13*  --   ALKPHOS 64  --   BILITOT 0.5  --   PROT 7.3  --   ALBUMIN 3.6 2.7*   No results for input(s): LIPASE, AMYLASE in the last 168 hours. No results for input(s): AMMONIA in the last 168 hours.  CBC:  Recent Labs Lab 12/09/14 1420  12/09/14 2030 12/09/14 2031  12/10/14 0400 12/10/14 1630 12/10/14 1636 12/11/14 0450  WBC 7.9  --  16.1*  --  14.4* 12.1*  --  10.4  HGB 10.1*  < > 10.2* 10.9* 9.3* 8.7* 10.2* 8.3*  HCT 33.0*  < > 33.1* 32.0* 30.9* 28.9* 30.0* 27.4*  MCV 106.5*  --  106.1*  --  107.7* 107.8*  --  108.3*  PLT 128*  --  176  --  156 133*  --  132*  < > = values in this interval not displayed.  Cardiac Enzymes: No results for input(s): CKTOTAL, CKMB, CKMBINDEX, TROPONINI in the last 168 hours.  BNP: Invalid input(s): POCBNP  CBG:  Recent Labs Lab 12/11/14 1608 12/11/14 1929 12/11/14 2344 12/12/14 0344 12/12/14 0800  GLUCAP 100* 126* 153* 86 91    Microbiology: Results for orders placed or performed during the hospital  encounter of 12/06/14  Surgical pcr screen     Status: None   Collection Time: 12/06/14  9:20 AM  Result Value Ref Range Status   MRSA, PCR NEGATIVE NEGATIVE Final   Staphylococcus aureus NEGATIVE NEGATIVE Final    Comment:        The Xpert SA Assay (FDA approved for NASAL specimens in patients over 14 years of age), is one component of a comprehensive surveillance program.  Test performance has been validated by [facility] for patients greater than or equal to 44 year old. It is not intended to diagnose infection nor to guide or monitor treatment.     Coagulation Studies:  Recent Labs  12/09/14 1420  LABPROT 17.4*  INR 1.42    Urinalysis: No results for input(s): COLORURINE, LABSPEC, PHURINE, GLUCOSEU, HGBUR, BILIRUBINUR, KETONESUR, PROTEINUR, UROBILINOGEN, NITRITE, LEUKOCYTESUR in the last 72 hours.  Invalid input(s): APPERANCEUR    Imaging: Dg Chest Port 1 View  12/11/2014  CLINICAL DATA:  CABG. EXAM: PORTABLE CHEST 1 VIEW COMPARISON:  12/10/2014. FINDINGS: Interim removal Swan-Ganz catheter, mediastinal drainage catheters, left chest tube. Right IJ sheath in stable position. Prior CABG and cardiac valve replacement. Stable cardiomegaly. No pulmonary venous congestion. Low lung  volumes with bibasilar atelectasis and/or infiltrates/edema. Small pleural effusions. No pneumothorax. IMPRESSION: 1. Interim removal Swan-Ganz catheter, mediastinal drainage catheters, left chest tube. Right IJ sheath in stable position. No pneumothorax. 2. Prior CABG and cardiac valve replacement.  Stable cardiomegaly. 3. Lung volumes with bibasilar atelectasis and/or infiltrates/edema. Small bilateral pleural effusions. Electronically Signed   By: [place]   On: 12/11/2014 07:29     Medications:   . sodium chloride 20 mL/hr at 12/10/14 1300  . sodium chloride    . sodium chloride 20 mL/hr at 12/09/14 1500  . sodium chloride 20 mL/hr at 12/12/14 0400  . phenylephrine (NEO-SYNEPHRINE) Adult infusion 10 mcg/min (12/12/14 0400)   . acetaminophen  1,000 mg Oral 4 times per day   Or  . acetaminophen (TYLENOL) oral liquid 160 mg/5 mL  1,000 mg Per Tube 4 times per day  . aspirin EC  325 mg Oral Daily   Or  . aspirin  324 mg Per Tube Daily  . atorvastatin  20 mg Oral q1800  . bisacodyl  10 mg Oral Daily   Or  . bisacodyl  10 mg Rectal Daily  . docusate sodium  200 mg Oral Daily  . pantoprazole  40 mg Oral Daily  . sodium chloride  3 mL Intravenous Q12H   sodium chloride, lactated ringers, ondansetron (ZOFRAN) IV, oxyCODONE, sodium chloride, traMADol  Assessment/ Plan:  Dialysis Orders: Center: [facility] on TTS . EDW 78.5 kg HD Bath 2.0k, 2.0Ca Time 4.0 hrs Heparin 3000. Access R FA AVF  Hectorol 12 mcg IV/HD Mircera 225 q 2 wks (last on 12/05/14) Venofer 100mg  load q hd thru 12/14/14 then 50mg  weekly thrus hd  Other op labs HGB 10.0 12/05/14 Ca 10.0 phos 5.6 pth 304  Assessment/Plan 1. CAD / AS = SP Dr. Cyndia Bent CABG x3 And AOrtic Vave replacement with [facility] - Ease pericardil valve 2. ESRD - HD TTS schedule K 3.7  3. Hypertension/volume - Below edw by wt 77.5 ,bp ok, Will plan ultrafiltration today carefully 1-2 L  4. Anemia - hgb 8.3's pre op Aranesp 200  thurs hd / Venofer load 100mg  q hd  5. Metabolic bone disease - Vit d on HD / On tums fpr Phos binder when eating   LOS: 3  Bettylee Feig W @TODAY @10 :30 AM

## 2014-12-12 NOTE — Progress Notes (Signed)
3 Days Post-Op Procedure(s) (LRB): AORTIC VALVE REPLACEMENT (AVR) WITH 23MM MAGNA EASE BIOPROSTHETIC VALVE (N/A) CORONARY ARTERY BYPASS GRAFTING (CABG), ON PUMP, TIMES THREE, USING LEFT INTERNAL MAMMARY ARTERY, RIGHT GREATER SAPHENOUS VEIN HARVESTED ENDOSCOPICALLY (N/A) TRANSESOPHAGEAL ECHOCARDIOGRAM (TEE) (N/A) Subjective:  No complaints  Objective: Vital signs in last 24 hours: Temp:  [97.7 F (36.5 C)-98.1 F (36.7 C)] 97.9 F (36.6 C) (12/08 0802) Pulse Rate:  [42-91] 79 (12/08 0700) Cardiac Rhythm:  [-] Atrial paced (12/08 0400) Resp:  [10-25] 11 (12/08 0700) BP: (81-134)/(45-68) 109/62 mmHg (12/08 0700) SpO2:  [88 %-100 %] 98 % (12/08 0700) Weight:  [81.4 kg (179 lb 7.3 oz)] 81.4 kg (179 lb 7.3 oz) (12/08 0500)  Hemodynamic parameters for last 24 hours:    Intake/Output from previous day: 12/07 0701 - 12/08 0700 In: 1103.2 [P.O.:360; I.V.:743.2] Out: 355 [Urine:355] Intake/Output this shift:    General appearance: alert and cooperative Neurologic: intact Heart: regular rate and rhythm, S1, S2 normal, no murmur, click, rub or gallop Lungs: clear to auscultation bilaterally Extremities: edema mild Wound: incision ok  Lab Results:  Recent Labs  12/10/14 1630 12/10/14 1636 12/11/14 0450  WBC 12.1*  --  10.4  HGB 8.7* 10.2* 8.3*  HCT 28.9* 30.0* 27.4*  PLT 133*  --  132*   BMET:  Recent Labs  12/10/14 1630 12/10/14 1636 12/11/14 0450  NA 136 138 134*  K 3.7 3.7 3.7  CL 98* 95* 96*  CO2 27  --  28  GLUCOSE 129* 126* 159*  BUN 15 16 24*  CREATININE 3.90*  3.82* 3.70* 4.83*  CALCIUM 8.3*  --  8.0*    PT/INR:  Recent Labs  12/09/14 1420  LABPROT 17.4*  INR 1.42   ABG    Component Value Date/Time   PHART 7.438 12/10/2014 0006   HCO3 24.7* 12/10/2014 0006   TCO2 28 12/10/2014 1636   ACIDBASEDEF 1.0 12/09/2014 2256   O2SAT 97.0 12/10/2014 0006   CBG (last 3)   Recent Labs  12/11/14 2344 12/12/14 0344 12/12/14 0800  GLUCAP 153* 86 91     Assessment/Plan: S/P Procedure(s) (LRB): AORTIC VALVE REPLACEMENT (AVR) WITH 23MM MAGNA EASE BIOPROSTHETIC VALVE (N/A) CORONARY ARTERY BYPASS GRAFTING (CABG), ON PUMP, TIMES THREE, USING LEFT INTERNAL MAMMARY ARTERY, RIGHT GREATER SAPHENOUS VEIN HARVESTED ENDOSCOPICALLY (N/A) TRANSESOPHAGEAL ECHOCARDIOGRAM (TEE) (N/A)  He is hemodynamically stable and neo down to 5 mcg. Should be able to get it off today. He is to have HD today and hopefully can remove some volume. Weight is 8 lbs over preop.  Continue ambulation and IS.  Will transfer to 2W when stable off neo.   LOS: 3 days    Eric [name] 12/12/2014

## 2014-12-13 LAB — GLUCOSE, CAPILLARY
GLUCOSE-CAPILLARY: 118 mg/dL — AB (ref 65–99)
Glucose-Capillary: 129 mg/dL — ABNORMAL HIGH (ref 65–99)
Glucose-Capillary: 93 mg/dL (ref 65–99)

## 2014-12-13 LAB — CBC
HEMATOCRIT: 23.8 % — AB (ref 39.0–52.0)
HEMOGLOBIN: 7.8 g/dL — AB (ref 13.0–17.0)
MCH: 33.2 pg (ref 26.0–34.0)
MCHC: 32.8 g/dL (ref 30.0–36.0)
MCV: 101.3 fL — ABNORMAL HIGH (ref 78.0–100.0)
Platelets: 112 10*3/uL — ABNORMAL LOW (ref 150–400)
RBC: 2.35 MIL/uL — ABNORMAL LOW (ref 4.22–5.81)
RDW: 15.9 % — ABNORMAL HIGH (ref 11.5–15.5)
WBC: 5.9 10*3/uL (ref 4.0–10.5)

## 2014-12-13 LAB — RENAL FUNCTION PANEL
ANION GAP: 8 (ref 5–15)
Albumin: 2.3 g/dL — ABNORMAL LOW (ref 3.5–5.0)
BUN: 28 mg/dL — ABNORMAL HIGH (ref 6–20)
CALCIUM: 8.3 mg/dL — AB (ref 8.9–10.3)
CO2: 27 mmol/L (ref 22–32)
Chloride: 102 mmol/L (ref 101–111)
Creatinine, Ser: 4.52 mg/dL — ABNORMAL HIGH (ref 0.61–1.24)
GFR calc Af Amer: 13 mL/min — ABNORMAL LOW (ref >60)
GFR calc non Af Amer: 11 mL/min — ABNORMAL LOW (ref >60)
GLUCOSE: 110 mg/dL — AB (ref 65–99)
POTASSIUM: 2.9 mmol/L — AB (ref 3.5–5.1)
Phosphorus: 3.2 mg/dL (ref 2.5–4.6)
SODIUM: 137 mmol/L (ref 135–145)

## 2014-12-13 MED ORDER — TRAMADOL HCL 50 MG PO TABS
50.0000 mg | ORAL_TABLET | Freq: Two times a day (BID) | ORAL | Status: DC
  Administered 2014-12-13 (×2): 50 mg via ORAL
  Filled 2014-12-13 (×2): qty 1

## 2014-12-13 MED ORDER — LIDOCAINE HCL (PF) 1 % IJ SOLN: 5.0000 mL | INTRAMUSCULAR | Status: DC | PRN

## 2014-12-13 MED ORDER — ONDANSETRON HCL 4 MG/2ML IJ SOLN: 4.0000 mg | Freq: Four times a day (QID) | INTRAMUSCULAR | Status: DC | PRN

## 2014-12-13 MED ORDER — BISACODYL 10 MG RE SUPP: 10.0000 mg | Freq: Every day | RECTAL | Status: DC | PRN

## 2014-12-13 MED ORDER — HEPARIN SODIUM (PORCINE) 1000 UNIT/ML DIALYSIS: 1000.0000 [IU] | INTRAMUSCULAR | Status: DC | PRN

## 2014-12-13 MED ORDER — LIDOCAINE-PRILOCAINE 2.5-2.5 % EX CREA: 1.0000 "application " | TOPICAL_CREAM | CUTANEOUS | Status: DC | PRN

## 2014-12-13 MED ORDER — DOCUSATE SODIUM 100 MG PO CAPS
200.0000 mg | ORAL_CAPSULE | Freq: Every day | ORAL | Status: DC
  Administered 2014-12-14 – 2014-12-16 (×3): 200 mg via ORAL
  Filled 2014-12-13 (×2): qty 2

## 2014-12-13 MED ORDER — PENTAFLUOROPROP-TETRAFLUOROETH EX AERO: 1.0000 "application " | INHALATION_SPRAY | CUTANEOUS | Status: DC | PRN

## 2014-12-13 MED ORDER — INSULIN ASPART 100 UNIT/ML ~~LOC~~ SOLN
0.0000 [IU] | Freq: Three times a day (TID) | SUBCUTANEOUS | Status: DC
  Administered 2014-12-13 – 2014-12-14 (×3): 2 [IU] via SUBCUTANEOUS

## 2014-12-13 MED ORDER — ASPIRIN EC 81 MG PO TBEC
81.0000 mg | DELAYED_RELEASE_TABLET | Freq: Every day | ORAL | Status: DC
  Administered 2014-12-14 – 2014-12-16 (×3): 81 mg via ORAL
  Filled 2014-12-13 (×3): qty 1

## 2014-12-13 MED ORDER — SODIUM CHLORIDE 0.9 % IJ SOLN: 3.0000 mL | INTRAMUSCULAR | Status: DC | PRN

## 2014-12-13 MED ORDER — SODIUM CHLORIDE 0.9 % IV SOLN: 100.0000 mL | INTRAVENOUS | Status: DC | PRN

## 2014-12-13 MED ORDER — PANTOPRAZOLE SODIUM 40 MG PO TBEC
40.0000 mg | DELAYED_RELEASE_TABLET | Freq: Every day | ORAL | Status: DC
  Administered 2014-12-14 – 2014-12-16 (×3): 40 mg via ORAL
  Filled 2014-12-13 (×4): qty 1

## 2014-12-13 MED ORDER — SODIUM CHLORIDE 0.9 % IV SOLN: 250.0000 mL | INTRAVENOUS | Status: DC | PRN

## 2014-12-13 MED ORDER — MOVING RIGHT ALONG BOOK
Freq: Once | Status: AC
  Administered 2014-12-13: 1
  Filled 2014-12-13: qty 1

## 2014-12-13 MED ORDER — ALTEPLASE 2 MG IJ SOLR
2.0000 mg | Freq: Once | INTRAMUSCULAR | Status: DC | PRN
  Filled 2014-12-13: qty 2

## 2014-12-13 MED ORDER — SODIUM CHLORIDE 0.9 % IV BOLUS (SEPSIS): 500.0000 mL | Freq: Once | INTRAVENOUS | Status: DC

## 2014-12-13 MED ORDER — GUAIFENESIN ER 600 MG PO TB12
600.0000 mg | ORAL_TABLET | Freq: Two times a day (BID) | ORAL | Status: DC | PRN
Start: 2014-12-13 — End: 2014-12-16

## 2014-12-13 MED ORDER — ONDANSETRON HCL 4 MG PO TABS: 4.0000 mg | ORAL_TABLET | Freq: Four times a day (QID) | ORAL | Status: DC | PRN

## 2014-12-13 MED ORDER — SODIUM CHLORIDE 0.9 % IJ SOLN
3.0000 mL | Freq: Two times a day (BID) | INTRAMUSCULAR | Status: DC
  Administered 2014-12-13 – 2014-12-15 (×3): 3 mL via INTRAVENOUS

## 2014-12-13 MED ORDER — LACTULOSE 10 GM/15ML PO SOLN
20.0000 g | Freq: Every day | ORAL | Status: DC | PRN
Start: 2014-12-13 — End: 2014-12-16
  Administered 2014-12-13: 20 g via ORAL
  Filled 2014-12-13 (×2): qty 30

## 2014-12-13 MED ORDER — BISACODYL 5 MG PO TBEC: 10.0000 mg | DELAYED_RELEASE_TABLET | Freq: Every day | ORAL | Status: DC | PRN

## 2014-12-13 NOTE — Progress Notes (Signed)
[name] ROUNDING NOTE   Subjective:   Interval History: Doing well no issues  Objective:  Vital signs in last 24 hours:  Temp:  [97.4 F (36.3 C)-99.6 F (37.6 C)] 98 F (36.7 C) (12/09 1100) Pulse Rate:  [70-95] 88 (12/09 1300) Resp:  [9-27] 13 (12/09 1300) BP: (91-138)/(48-99) 109/59 mmHg (12/09 1300) SpO2:  [91 %-100 %] 92 % (12/09 1300) Weight:  [81.1 kg (178 lb 12.7 oz)-84.7 kg (186 lb 11.7 oz)] 81.1 kg (178 lb 12.7 oz) (12/09 0820)  Weight change: 3.3 kg (7 lb 4.4 oz) Filed Weights   12/12/14 0500 12/13/14 0345 12/13/14 0820  Weight: 81.4 kg (179 lb 7.3 oz) 84.7 kg (186 lb 11.7 oz) 81.1 kg (178 lb 12.7 oz)    Intake/Output: I/O last 3 completed shifts: In: 815.2 [P.O.:180; I.V.:635.2] Out: 690 [Urine:690]   Intake/Output this shift:  Total I/O In: 190 [P.O.:180; I.V.:10] Out: 2200 [Urine:200; Other:2000]  CVS- RRR RS- CTA ABD- BS present soft non-distended EXT- no edema   Basic Metabolic Panel:  Recent Labs Lab 12/09/14 2030  12/10/14 0400 12/10/14 1630 12/10/14 1636 12/11/14 0450 12/13/14 0530  NA  --   < > 137 136 138 134* 137  K  --   < > 6.5* 3.7 3.7 3.7 2.9*  CL  --   < > 104 98* 95* 96* 102  CO2  --   --  21* 27  --  28 27  GLUCOSE  --   < > 117* 129* 126* 159* 110*  BUN  --   < > 38* 15 16 24* 28*  CREATININE 6.56*  < > 6.94* 3.90*  3.82* 3.70* 4.83* 4.52*  CALCIUM  --   < > 8.3* 8.3*  --  8.0* 8.3*  MG 2.1  --  2.1 1.9  --   --   --   PHOS  --   --   --  5.1*  --   --  3.2  < > = values in this interval not displayed.  Liver Function Tests:  Recent Labs Lab 12/10/14 1630 12/13/14 0530  ALBUMIN 2.7* 2.3*   No results for input(s): LIPASE, AMYLASE in the last 168 hours. No results for input(s): AMMONIA in the last 168 hours.  CBC:  Recent Labs Lab 12/09/14 2030  12/10/14 0400 12/10/14 1630 12/10/14 1636 12/11/14 0450 12/13/14 0530  WBC 16.1*  --  14.4* 12.1*  --  10.4 5.9  HGB 10.2*  < > 9.3* 8.7*  10.2* 8.3* 7.8*  HCT 33.1*  < > 30.9* 28.9* 30.0* 27.4* 23.8*  MCV 106.1*  --  107.7* 107.8*  --  108.3* 101.3*  PLT 176  --  156 133*  --  132* 112*  < > = values in this interval not displayed.  Cardiac Enzymes: No results for input(s): CKTOTAL, CKMB, CKMBINDEX, TROPONINI in the last 168 hours.  BNP: Invalid input(s): POCBNP  CBG:  Recent Labs Lab 12/11/14 2344 12/12/14 0344 12/12/14 0800 12/12/14 1151 12/13/14 1246  GLUCAP 153* 86 91 100* 118*    Microbiology: Results for orders placed or performed during the hospital encounter of 12/06/14  Surgical pcr screen     Status: None   Collection Time: 12/06/14  9:20 AM  Result Value Ref Range Status   MRSA, PCR NEGATIVE NEGATIVE Final   Staphylococcus aureus NEGATIVE NEGATIVE Final    Comment:        The Xpert SA Assay (FDA approved for NASAL specimens in patients  over 50 years of age), is one component of a comprehensive surveillance program.  Test performance has been validated by [facility] for patients greater than or equal to 32 year old. It is not intended to diagnose infection nor to guide or monitor treatment.     Coagulation Studies: No results for input(s): LABPROT, INR in the last 72 hours.  Urinalysis: No results for input(s): COLORURINE, LABSPEC, PHURINE, GLUCOSEU, HGBUR, BILIRUBINUR, KETONESUR, PROTEINUR, UROBILINOGEN, NITRITE, LEUKOCYTESUR in the last 72 hours.  Invalid input(s): APPERANCEUR    Imaging: No results found.   Medications:     . aspirin EC  81 mg Oral Daily  . atorvastatin  20 mg Oral q1800  . darbepoetin (ARANESP) injection - DIALYSIS  100 mcg Intravenous Q Thu-HD  . docusate sodium  200 mg Oral Daily  . insulin aspart  0-24 Units Subcutaneous TID AC & HS  . [START ON 12/14/2014] pantoprazole  40 mg Oral QAC breakfast  . sodium chloride  3 mL Intravenous Q12H  . traMADol  50 mg Oral Q12H   sodium chloride, sodium chloride, sodium chloride, alteplase, bisacodyl **OR**  bisacodyl, guaiFENesin, lactulose, lidocaine (PF), lidocaine-prilocaine, ondansetron **OR** ondansetron (ZOFRAN) IV, pentafluoroprop-tetrafluoroeth, sodium chloride  Assessment/ Plan:  Dialysis Orders: Center: [facility] on [facility] . EDW 78.5 kg HD Bath 2.0k, 2.0Ca Time 4.0 hrs Heparin 3000. Access R FA AVF  Hectorol 12 mcg IV/HD Mircera 225 q 2 wks (last on 12/05/14) Venofer 100mg  load q hd thru 12/14/14 then 50mg  weekly thrus hd  Other op labs HGB 10.0 12/05/14 Ca 10.0 phos 5.6 pth 304  Assessment/Plan 1. CAD / AS = SP Dr. Cyndia Bent CABG x3 And AOrtic Vave replacement with [facility] - Ease pericardil valve 2. ESRD - HD TTS schedule K 3.2    3. Hypertension/volume - Below edw by wt 77.5 ,bp ok, Will plan ultrafiltration tomorrow 0-1 L 4. Anemia - hgb 7.8 s pre op Aranesp 200 thurs hd / Venofer load 100mg  q hd  5. Metabolic bone disease - Vit d on HD / On tums fpr Phos binder when eating    LOS: 4 Eric [name] W @TODAY @4 :17 PM

## 2014-12-13 NOTE — Progress Notes (Addendum)
Report called to [place] on [address] [place]. 5:45 PM   Pt transported to [place] via ambulation, vss, settled in bed receiving RN at bedside. 6:31 PM

## 2014-12-13 NOTE — Progress Notes (Signed)
Patient ambulated approx. 150 ft using a RW on RA with minimal assistance, patient tolerated well, VSS.

## 2014-12-13 NOTE — Progress Notes (Addendum)
      [address]       [place] [address]             [phone]      4 Days Post-Op Procedure(s) (LRB): AORTIC VALVE REPLACEMENT (AVR) WITH 23MM MAGNA EASE BIOPROSTHETIC VALVE (N/A) CORONARY ARTERY BYPASS GRAFTING (CABG), ON PUMP, TIMES THREE, USING LEFT INTERNAL MAMMARY ARTERY, RIGHT GREATER SAPHENOUS VEIN HARVESTED ENDOSCOPICALLY (N/A) TRANSESOPHAGEAL ECHOCARDIOGRAM (TEE) (N/A)   Subjective:  Eric [name] is ready for dialysis to be completed.  He has ambulated.  He has no yet moved his bowels.  Objective: Vital signs in last 24 hours: Temp:  [97.5 F (36.4 C)-99.6 F (37.6 C)] 99 F (37.2 C) (12/09 0354) Pulse Rate:  [69-101] 92 (12/09 0730) Cardiac Rhythm:  [-] Normal sinus rhythm (12/09 0400) Resp:  [9-24] 22 (12/09 0730) BP: (90-138)/(44-99) 99/50 mmHg (12/09 0730) SpO2:  [89 %-100 %] 96 % (12/09 0730) Weight:  [186 lb 11.7 oz (84.7 kg)] 186 lb 11.7 oz (84.7 kg) (12/09 0345)  Intake/Output from previous day: 12/08 0701 - 12/09 0700 In: 256.3 [I.V.:256.3] Out: 415 [Urine:415]  General appearance: alert, cooperative and no distress Heart: regular rate and rhythm Lungs: clear to auscultation bilaterally Abdomen: soft, non-tender; bowel sounds normal; no masses,  no organomegaly Extremities: edema trace Wound: clean and dry  Lab Results:  Recent Labs  12/11/14 0450 12/13/14 0530  WBC 10.4 5.9  HGB 8.3* 7.8*  HCT 27.4* 23.8*  PLT 132* 112*   BMET:  Recent Labs  12/11/14 0450 12/13/14 0530  NA 134* 137  K 3.7 2.9*  CL 96* 102  CO2 28 27  GLUCOSE 159* 110*  BUN 24* 28*  CREATININE 4.83* 4.52*  CALCIUM 8.0* 8.3*    PT/INR: No results for input(s): LABPROT, INR in the last 72 hours. ABG    Component Value Date/Time   PHART 7.438 12/10/2014 0006   HCO3 24.7* 12/10/2014 0006   TCO2 28 12/10/2014 1636   ACIDBASEDEF 1.0 12/09/2014 2256   O2SAT 97.0 12/10/2014 0006   CBG (last 3)   Recent Labs  12/12/14 0344 12/12/14 0800  12/12/14 1151  GLUCAP 86 91 100*    Assessment/Plan: S/P Procedure(s) (LRB): AORTIC VALVE REPLACEMENT (AVR) WITH 23MM MAGNA EASE BIOPROSTHETIC VALVE (N/A) CORONARY ARTERY BYPASS GRAFTING (CABG), ON PUMP, TIMES THREE, USING LEFT INTERNAL MAMMARY ARTERY, RIGHT GREATER SAPHENOUS VEIN HARVESTED ENDOSCOPICALLY (N/A) TRANSESOPHAGEAL ECHOCARDIOGRAM (TEE) (N/A)  1.  CV- hemodynamically stable off all drips- will hold beta blocker today, pressure is labile in the upper 90s, can hopefully start prior to discharge 2. Pulm- no acute issues, continue IS 3. Renal- ESRD dialysis this morning, Hypokalemic at 2.9, will repeat 4. Expected post operative blood loss anemia- Hgb down to 7.8, will monitor, on Aranesp 5. LOC constipation- will add Lactulose 6. Dispo- patient stable, undergoing dialysis this morning, repeat K, possibly transfer to stepdown this afternoon   LOS: 4 days    BARRETT, ERIN 12/13/2014  Was up all night getting dialysis Plan to circle today Needs pt I have seen and examined Eric [name] and agree with the above assessment  and plan.  Grace Isaac MD Beeper [phone] Office [phone] 12/13/2014 12:00 PM

## 2014-12-14 ENCOUNTER — Inpatient Hospital Stay (HOSPITAL_COMMUNITY): Payer: Medicare Other

## 2014-12-14 LAB — GLUCOSE, CAPILLARY
GLUCOSE-CAPILLARY: 136 mg/dL — AB (ref 65–99)
Glucose-Capillary: 104 mg/dL — ABNORMAL HIGH (ref 65–99)
Glucose-Capillary: 132 mg/dL — ABNORMAL HIGH (ref 65–99)

## 2014-12-14 LAB — BASIC METABOLIC PANEL
Anion gap: 9 (ref 5–15)
BUN: 24 mg/dL — ABNORMAL HIGH (ref 6–20)
CO2: 28 mmol/L (ref 22–32)
Calcium: 8.6 mg/dL — ABNORMAL LOW (ref 8.9–10.3)
Chloride: 100 mmol/L — ABNORMAL LOW (ref 101–111)
Creatinine, Ser: 5.12 mg/dL — ABNORMAL HIGH (ref 0.61–1.24)
GFR calc Af Amer: 11 mL/min — ABNORMAL LOW (ref >60)
GFR calc non Af Amer: 9 mL/min — ABNORMAL LOW (ref >60)
Glucose, Bld: 100 mg/dL — ABNORMAL HIGH (ref 65–99)
Potassium: 3.6 mmol/L (ref 3.5–5.1)
Sodium: 137 mmol/L (ref 135–145)

## 2014-12-14 LAB — CBC
HCT: 24.7 % — ABNORMAL LOW (ref 39.0–52.0)
Hemoglobin: 7.9 g/dL — ABNORMAL LOW (ref 13.0–17.0)
MCH: 33.2 pg (ref 26.0–34.0)
MCHC: 32 g/dL (ref 30.0–36.0)
MCV: 103.8 fL — ABNORMAL HIGH (ref 78.0–100.0)
Platelets: 122 10*3/uL — ABNORMAL LOW (ref 150–400)
RBC: 2.38 MIL/uL — ABNORMAL LOW (ref 4.22–5.81)
RDW: 16.5 % — ABNORMAL HIGH (ref 11.5–15.5)
WBC: 6.4 10*3/uL (ref 4.0–10.5)

## 2014-12-14 MED ORDER — LIDOCAINE-PRILOCAINE 2.5-2.5 % EX CREA: 1.0000 "application " | TOPICAL_CREAM | CUTANEOUS | Status: DC | PRN

## 2014-12-14 MED ORDER — OXYCODONE HCL 5 MG PO TABS
5.0000 mg | ORAL_TABLET | ORAL | Status: DC | PRN
  Administered 2014-12-14 (×2): 5 mg via ORAL
  Administered 2014-12-14 (×2): 10 mg via ORAL
  Administered 2014-12-15 – 2014-12-16 (×5): 5 mg via ORAL
  Filled 2014-12-14: qty 1
  Filled 2014-12-14: qty 2
  Filled 2014-12-14 (×6): qty 1

## 2014-12-14 MED ORDER — HEPARIN SODIUM (PORCINE) 1000 UNIT/ML DIALYSIS: 1000.0000 [IU] | INTRAMUSCULAR | Status: DC | PRN

## 2014-12-14 MED ORDER — ALTEPLASE 2 MG IJ SOLR: 2.0000 mg | Freq: Once | INTRAMUSCULAR | Status: DC | PRN

## 2014-12-14 MED ORDER — LIDOCAINE HCL (PF) 1 % IJ SOLN: 5.0000 mL | INTRAMUSCULAR | Status: DC | PRN

## 2014-12-14 MED ORDER — TRAMADOL HCL 50 MG PO TABS
50.0000 mg | ORAL_TABLET | Freq: Four times a day (QID) | ORAL | Status: DC | PRN
  Administered 2014-12-14: 50 mg via ORAL
  Filled 2014-12-14: qty 1

## 2014-12-14 MED ORDER — OXYCODONE HCL 5 MG PO TABS
ORAL_TABLET | ORAL | Status: AC
  Filled 2014-12-14: qty 2

## 2014-12-14 MED ORDER — SODIUM CHLORIDE 0.9 % IV SOLN: 100.0000 mL | INTRAVENOUS | Status: DC | PRN

## 2014-12-14 MED ORDER — PENTAFLUOROPROP-TETRAFLUOROETH EX AERO: 1.0000 "application " | INHALATION_SPRAY | CUTANEOUS | Status: DC | PRN

## 2014-12-14 NOTE — Progress Notes (Signed)
Patient requesting pain medicine, no prns ordered, scheduled tramadol not due until 10am. PA made aware.  Cyndia Bent

## 2014-12-14 NOTE — Progress Notes (Signed)
Dialysis treatment completed.  1000 mL ultrafiltrated and net fluid removal 500 mL.    Patient status unchanged. Lung sounds clear to ausculation in all fields. No edema. Cardiac: regular R&R.  Disconnected lines and removed needles.  Pressure held for 10 minutes and band aid/gauze dressing applied.  Report given to bedside RN, [place].

## 2014-12-14 NOTE — Progress Notes (Signed)
Pt arrived to unit per bed at 0900.  Report received from bedside RN.  Consent verified.  A & O X 4  Lungs clear  Regular R&R  No edema.  RUAVF accessed with 15 gauge needles.  Pulsation of blood noted.  Flushed with NS.  Tx initiated at 0931 with goal of 1527mL and net fluid removal of 1 L.  Physician notified of 3.6 potassium, order modified for 3K bath.  Will continue to monitor.

## 2014-12-14 NOTE — Evaluation (Signed)
Physical Therapy Evaluation Patient Details Name: Eric [name] MRN: [id number] DOB: [birth year] Today's Date: 12/14/2014   History of Present Illness  Patient is a 81 y/o male s/p CABG and AVR. PMH includes CKD, gout, heart murmur, anxiety.  Clinical Impression  Patient presents with functional limitations due to deficits listed in PT problem list (see below). Pt with generalized weakness and impaired endurance/mobility s/p above surgery. Tolerated ambulation with min guard assist for safety. Pt will need RW for home use and HHPT to improve endurance, strength and safety. Pt has support from wife at home. Educated pt on sternal precautions. Will need to practice bed mobility next session as tolerated. Will follow acutely.     Follow Up Recommendations Home health PT;Supervision - Intermittent    Equipment Recommendations  Rolling walker with 5" wheels    Recommendations for Other Services OT consult     Precautions / Restrictions Precautions Precautions: Sternal Restrictions Weight Bearing Restrictions: No      Mobility  Bed Mobility               General bed mobility comments: Sitting in chair upon PT arrival.   Transfers Overall transfer level: Needs assistance Equipment used: Rolling walker (2 wheeled) Transfers: Sit to/from Stand Sit to Stand: Min guard         General transfer comment: Min guard for safety. Cues to stand without using UEs.   Ambulation/Gait Ambulation/Gait assistance: Min guard Ambulation Distance (Feet): 300 Feet Assistive device: Rolling walker (2 wheeled) Gait Pattern/deviations: Step-through pattern;Decreased stride length   Gait velocity interpretation: <1.8 ft/sec, indicative of risk for recurrent falls General Gait Details: Slow, mildly unsteady gait but no LOB. HR ranged from 90-104 bpm. Mild DOE.   Stairs            Wheelchair Mobility    Modified Rankin (Stroke Patients Only)       Balance Overall balance  assessment: Needs assistance Sitting-balance support: Feet supported;No upper extremity supported Sitting balance-Leahy Scale: Good     Standing balance support: During functional activity Standing balance-Leahy Scale: Poor Standing balance comment: Relient on RW for support.                              Pertinent Vitals/Pain Pain Assessment: Faces Faces Pain Scale: Hurts a little bit Pain Location: incision Pain Descriptors / Indicators: Sore Pain Intervention(s): Monitored during session;Premedicated before session;Repositioned    Home Living Family/patient expects to be discharged to:: Private residence Living Arrangements: Spouse/significant other Available Help at Discharge: Family;Available 24 hours/day Type of Home:  ([place] living facility) Home Access: Level entry;Elevator     Home Layout: One level Home Equipment: None      Prior Function Level of Independence: Independent         Comments: Long walks to get to dining hall.     Hand Dominance        Extremity/Trunk Assessment   Upper Extremity Assessment: Defer to OT evaluation           Lower Extremity Assessment: Generalized weakness         Communication   Communication: No difficulties  Cognition Arousal/Alertness: Awake/alert Behavior During Therapy: WFL for tasks assessed/performed Overall Cognitive Status: Within Functional Limits for tasks assessed                      General Comments General comments (skin integrity, edema, etc.): wife, son and  family present during session. Lengthy discussion re: sternal precautions, disposition, exercise, technique to get in/out of bed etc.     Exercises        Assessment/Plan    PT Assessment Patient needs continued PT services  PT Diagnosis Difficulty walking;Acute pain   PT Problem List Decreased strength;Pain;Decreased activity tolerance;Decreased balance;Decreased mobility;Decreased knowledge  of precautions;Cardiopulmonary status limiting activity  PT Treatment Interventions Balance training;Functional mobility training;Therapeutic activities;Therapeutic exercise;Patient/family education;Gait training   PT Goals (Current goals can be found in the Care Plan section) Acute Rehab PT Goals Patient Stated Goal: to go home when ready and get back to walking PT Goal Formulation: With patient Time For Goal Achievement: 12/28/14 Potential to Achieve Goals: Good    Frequency Min 3X/week   Barriers to discharge        Co-evaluation               End of Session Equipment Utilized During Treatment: Gait belt Activity Tolerance: Patient tolerated treatment well Patient left: in chair;with call bell/phone within reach;with family/visitor present Nurse Communication: Mobility status;Precautions         Time: [id number] PT Time Calculation (min) (ACUTE ONLY): 29 min   Charges:   PT Evaluation $Initial PT Evaluation Tier I: 1 Procedure PT Treatments $Gait Training: 8-22 mins   PT G Codes:        Ra Pfiester A Claudy Abdallah 12/14/2014, 3:28 PM Wray Kearns, [place], DPT [phone]

## 2014-12-14 NOTE — Progress Notes (Signed)
PT Cancellation Note  Patient Details Name: Eric [name] MRN: [id number] DOB: 12-27-1930   Cancelled Treatment:    Reason Eval/Treat Not Completed: Patient at procedure or test/unavailable  Pt off floor at dialysis. Will follow up next available time to perform PT eval.  Marguarite Arbour A Mliss Wedin 12/14/2014, 10:01 AM  Wray Kearns, PT, DPT [phone]

## 2014-12-14 NOTE — Progress Notes (Addendum)
[address]       [place] [address]             [phone]      5 Days Post-Op Procedure(s) (LRB): AORTIC VALVE REPLACEMENT (AVR) WITH 23MM MAGNA EASE BIOPROSTHETIC VALVE (N/A) CORONARY ARTERY BYPASS GRAFTING (CABG), ON PUMP, TIMES THREE, USING LEFT INTERNAL MAMMARY ARTERY, RIGHT GREATER SAPHENOUS VEIN HARVESTED ENDOSCOPICALLY (N/A) TRANSESOPHAGEAL ECHOCARDIOGRAM (TEE) (N/A) Subjective: Feels overall pretty well, some pain   Objective: Vital signs in last 24 hours: Temp:  [97.7 F (36.5 C)-99.1 F (37.3 C)] 97.7 F (36.5 C) (12/10 0631) Pulse Rate:  [84-95] 89 (12/09 1700) Cardiac Rhythm:  [-] Normal sinus rhythm (12/10 0631) Resp:  [11-27] 18 (12/10 0631) BP: (88-142)/(52-70) 142/70 mmHg (12/10 0631) SpO2:  [91 %-100 %] 97 % (12/10 0631) Weight:  [175 lb (79.379 kg)] 175 lb (79.379 kg) (12/10 0500)  Hemodynamic parameters for last 24 hours:    Intake/Output from previous day: 12/09 0701 - 12/10 0700 In: 190 [P.O.:180; I.V.:10] Out: 2200 [Urine:200] Intake/Output this shift:    General appearance: alert, cooperative and no distress Heart: regular rate and rhythm Lungs: clear to auscultation bilaterally Abdomen: benign Extremities: + BLE edema Wound: incis healing well  Lab Results:  Recent Labs  12/13/14 0530 12/14/14 0240  WBC 5.9 6.4  HGB 7.8* 7.9*  HCT 23.8* 24.7*  PLT 112* 122*   BMET:  Recent Labs  12/13/14 0530 12/14/14 0240  NA 137 137  K 2.9* 3.6  CL 102 100*  CO2 27 28  GLUCOSE 110* 100*  BUN 28* 24*  CREATININE 4.52* 5.12*  CALCIUM 8.3* 8.6*    PT/INR: No results for input(s): LABPROT, INR in the last 72 hours. ABG    Component Value Date/Time   PHART 7.438 12/10/2014 0006   HCO3 24.7* 12/10/2014 0006   TCO2 28 12/10/2014 1636   ACIDBASEDEF 1.0 12/09/2014 2256   O2SAT 97.0 12/10/2014 0006   CBG (last 3)   Recent Labs  12/13/14 1246 12/13/14 1636 12/13/14 2106  GLUCAP 118* 129* 93     Meds Scheduled Meds: . aspirin EC  81 mg Oral Daily  . atorvastatin  20 mg Oral q1800  . darbepoetin (ARANESP) injection - DIALYSIS  100 mcg Intravenous Q Thu-HD  . docusate sodium  200 mg Oral Daily  . insulin aspart  0-24 Units Subcutaneous TID AC & HS  . pantoprazole  40 mg Oral QAC breakfast  . sodium chloride  3 mL Intravenous Q12H  . traMADol  50 mg Oral Q12H   Continuous Infusions:  PRN Meds:.sodium chloride, sodium chloride, sodium chloride, alteplase, bisacodyl **OR** bisacodyl, guaiFENesin, lactulose, lidocaine (PF), lidocaine-prilocaine, ondansetron **OR** ondansetron (ZOFRAN) IV, pentafluoroprop-tetrafluoroeth, sodium chloride  Xrays No results found.  Assessment/Plan: S/P Procedure(s) (LRB): AORTIC VALVE REPLACEMENT (AVR) WITH 23MM MAGNA EASE BIOPROSTHETIC VALVE (N/A) CORONARY ARTERY BYPASS GRAFTING (CABG), ON PUMP, TIMES THREE, USING LEFT INTERNAL MAMMARY ARTERY, RIGHT GREATER SAPHENOUS VEIN HARVESTED ENDOSCOPICALLY (N/A) TRANSESOPHAGEAL ECHOCARDIOGRAM (TEE) (N/A)  1 overall progress conts to be good 2 cont to push rehab/pulm toilet as able 3 nephrology is managing renal issues 4 BP is variable from 99991111 to 0000000 systolic- monitor 5 H/H slightly improved- on aranesp 6 may be ready for [facility] by Monday- will get SW assistance   LOS: 5 days    GOLD,WAYNE E 12/14/2014   Dialysis today [facility] Monday I have seen and examined [name] and agree with the above assessment  and  plan.  Grace Isaac MD Beeper [phone] Office [phone] 12/14/2014 10:33 AM

## 2014-12-14 NOTE — Progress Notes (Signed)
[name] Progress Note  Assessment/Plan: 1. S/P AVR/CABG X 3 per Dr. Cyndia Bent: Doing well. Anticipate DC home Monday. 2. ESRD -TTS at [facility]. Had HD today. Tolerated well.  . Anemia - Hgb 7.9. On ESA. Follow CBC. On Venofer.  4. Secondary hyperparathyroidism - C Ca 9.26. No binders/Vit D at present.  5. HTN/volume - HD today, Net UF 500. Tolerated well.  6. Nutrition -Albumin 2.3. On renal/carb mod diet. Appetite good. 7. DM: per primary  Rita H. Brown NP-C 12/14/2014, 2:21 PM  [facility] [phone]  Pt seen, examined and agree w A/P as above.  Kelly Splinter MD [facility] pager [phone]    cell [phone] 12/14/2014, 2:37 PM    Subjective: "I feel good".  Seen post HD, sitting up in chair eating lunch. Wife walked down hallway with me. No C/Os   Objective Filed Vitals:   12/14/14 1101 12/14/14 1131 12/14/14 1201 12/14/14 1231  BP: 124/66 118/65 126/70 108/67  Pulse: 86 88 89 90  Temp:    97.5 F (36.4 C)  TempSrc:      Resp:    18  Height:      Weight:    79.5 kg (175 lb 4.3 oz)  SpO2:       Physical Exam General: elderly male, very pleasant in NAD Heart: S1, S2, plus aortic valve click. No JVD, No edema. Lungs: Bilateral breath sounds CTA A/P Abdomen: Abdomen soft,nontender.  Extremities: No LE edema. Dialysis Access: RFA AVF + thrill/+ bruit  Dialysis Orders: Dialysis Orders: Center: [facility] on TTS . EDW 78.5 kg HD Bath 2.0k, 2.0Ca Time 4.0 hrs Heparin 3000. Access R FA AVF  Hectorol 12 mcg IV/HD Mircera 225 q 2 wks (last on 12/05/14) Venofer 100mg  load q hd thru 12/14/14 then 50mg  weekly thrus hd  Other op labs HGB 10.0 12/05/14 Ca 10.0 phos 5.6 pth 304  Additional Objective Labs: Basic Metabolic Panel:  Recent Labs Lab 12/10/14 1630  12/11/14 0450 12/13/14 0530 12/14/14 0240  NA 136  < > 134* 137 137  K 3.7  < > 3.7 2.9* 3.6  CL 98*  < > 96* 102 100*  CO2 27  --  28 27 28   GLUCOSE 129*  < >  159* 110* 100*  BUN 15  < > 24* 28* 24*  CREATININE 3.90*  3.82*  < > 4.83* 4.52* 5.12*  CALCIUM 8.3*  --  8.0* 8.3* 8.6*  PHOS 5.1*  --   --  3.2  --   < > = values in this interval not displayed. Liver Function Tests:  Recent Labs Lab 12/10/14 1630 12/13/14 0530  ALBUMIN 2.7* 2.3*   No results for input(s): LIPASE, AMYLASE in the last 168 hours. CBC:  Recent Labs Lab 12/10/14 0400 12/10/14 1630  12/11/14 0450 12/13/14 0530 12/14/14 0240  WBC 14.4* 12.1*  --  10.4 5.9 6.4  HGB 9.3* 8.7*  < > 8.3* 7.8* 7.9*  HCT 30.9* 28.9*  < > 27.4* 23.8* 24.7*  MCV 107.7* 107.8*  --  108.3* 101.3* 103.8*  PLT 156 133*  --  132* 112* 122*  < > = values in this interval not displayed. Blood Culture No results found for: SDES, SPECREQUEST, CULT, REPTSTATUS  Cardiac Enzymes: No results for input(s): CKTOTAL, CKMB, CKMBINDEX, TROPONINI in the last 168 hours. CBG:  Recent Labs Lab 12/12/14 1151 12/13/14 1246 12/13/14 1636 12/13/14 2106 12/14/14 0902  GLUCAP 100* 118* 129* 93 104*   Iron Studies: No  results for input(s): IRON, TIBC, TRANSFERRIN, FERRITIN in the last 72 hours. @lablastinr3 @ Studies/Results: Dg Chest 2 View  12/14/2014  CLINICAL DATA:  Sternotomy wires overlie normal cardiac silhouette. EXAM: CHEST  2 VIEW COMPARISON:  12/11/2014 FINDINGS: Sternotomy wires overlie normal cardiac silhouette. Bilateral pleural effusions noted. Improved LEFT basilar atelectasis. Removal of RIGHT IJ sheath. No pneumothorax. IMPRESSION: Bilateral pleural effusions. Improvement in LEFT basilar atelectasis. Electronically Signed   By: Suzy Bouchard M.D.   On: 12/14/2014 09:03   Medications:   . aspirin EC  81 mg Oral Daily  . atorvastatin  20 mg Oral q1800  . darbepoetin (ARANESP) injection - DIALYSIS  100 mcg Intravenous Q Thu-HD  . docusate sodium  200 mg Oral Daily  . insulin aspart  0-24 Units Subcutaneous TID AC & HS  . pantoprazole  40 mg Oral QAC breakfast  . sodium chloride   3 mL Intravenous Q12H

## 2014-12-15 LAB — GLUCOSE, CAPILLARY
GLUCOSE-CAPILLARY: 111 mg/dL — AB (ref 65–99)
GLUCOSE-CAPILLARY: 100 mg/dL — AB (ref 65–99)
Glucose-Capillary: 101 mg/dL — ABNORMAL HIGH (ref 65–99)
Glucose-Capillary: 129 mg/dL — ABNORMAL HIGH (ref 65–99)

## 2014-12-15 MED ORDER — METOPROLOL TARTRATE 12.5 MG HALF TABLET
12.5000 mg | ORAL_TABLET | Freq: Two times a day (BID) | ORAL | Status: DC
  Administered 2014-12-15 – 2014-12-16 (×3): 12.5 mg via ORAL
  Filled 2014-12-15 (×3): qty 1

## 2014-12-15 NOTE — Discharge Instructions (Signed)
Aortic Valve Replacement, Care After Refer to this sheet in the next few weeks. These instructions provide you with information on caring for yourself after your procedure. Your health care provider may also give you specific instructions. Your treatment has been planned according to current medical practices, but problems sometimes occur. Call your health care provider if you have any problems or questions after your procedure. HOME CARE INSTRUCTIONS   Take medicines only as directed by your health care provider.  If your health care provider has prescribed elastic stockings, wear them as directed.  Take frequent naps or rest often throughout the day.  Avoid lifting over 10 lbs (4.5 kg) or pushing or pulling things with your arms for 6-8 weeks or as directed by your health care provider.  Avoid driving or airplane travel for 4-6 weeks after surgery or as directed by your health care provider. If you are riding in a car for an extended period, stop every 1-2 hours to stretch your legs. Keep a record of your medicines and medical history with you when traveling.  Do not drive or operate heavy machinery while taking pain medicine. (narcotics).  Do not cross your legs.  Do not use any tobacco products including cigarettes, chewing tobacco, or electronic cigarettes. If you need help quitting, ask your health care provider.  Do not take baths, swim, or use a hot tub until your health care provider approves. Take showers once your health care provider approves. Pat incisions dry. Do not rub incisions with a washcloth or towel.  Avoid climbing stairs and using the handrail to pull yourself up for the first 2-3 weeks after surgery.  Return to work as directed by your health care provider.  Drink enough fluid to keep your urine clear or pale yellow.  Do not strain to have a bowel movement. Eat high-fiber foods if you become constipated. You may also take a medicine to help you have a bowel  movement (laxative) as directed by your health care provider.  Resume sexual activity as directed by your health care provider. Men should not use medicines for erectile dysfunction until their doctor says it isokay.  If you had a certain type of heart condition in the past, you may need to take antibiotic medicine before having dental work or surgery. Let your dentist and health care providers know if you had one or more of the following:  Previous endocarditis.  An artificial (prosthetic) heart valve.  Congenital heart disease. SEEK MEDICAL CARE IF:  You develop a skin rash.   You experience sudden changes in your weight.  You have a fever. SEEK IMMEDIATE MEDICAL CARE IF:   You develop chest pain that is not coming from your incision.  You have drainage (pus), redness, swelling, or pain at your incision site.   You develop shortness of breath or have difficulty breathing.   You have increased bleeding from your incision site.   You develop light-headedness.  MAKE SURE YOU:   Understand these directions.  Will watch your condition.  Will get help right away if you are not doing well or get worse.   This information is not intended to replace advice given to you by your health care provider. Make sure you discuss any questions you have with your health care provider.   Document Released: 07/09/2004 Document Revised: 01/11/2014 Document Reviewed: 10/05/2011 Elsevier Interactive Patient Education 2016 Elsevier Inc. Coronary Artery Bypass Grafting, Care After These instructions give you information on caring for yourself after your  procedure. Your doctor may also give you more specific instructions. Call your doctor if you have any problems or questions after your procedure.  HOME CARE  Only take medicine as told by your doctor. Take medicines exactly as told. Do not stop taking medicines or start any new medicines without talking to your doctor first.  Take your  pulse as told by your doctor.  Do deep breathing as told by your doctor. Use your breathing device (incentive spirometer), if given, to practice deep breathing several times a day. Support your chest with a pillow or your arms when you take deep breaths or cough.  Keep the area clean, dry, and protected where the surgery cuts (incisions) were made. Remove bandages (dressings) only as told by your doctor. If strips were applied to surgical area, do not take them off. They fall off on their own.  Check the surgery area daily for puffiness (swelling), redness, or leaking fluid.  If surgery cuts were made in your legs:  Avoid crossing your legs.  Avoid sitting for long periods of time. Change positions every 30 minutes.  Raise your legs when you are sitting. Place them on pillows.  Wear stockings that help keep blood clots from forming in your legs (compression stockings).  Only take sponge baths until your doctor says it is okay to take showers. Pat the surgery area dry. Do not rub the surgery area with a washcloth or towel. Do not bathe, swim, or use a hot tub until your doctor says it is okay.  Eat foods that are high in fiber. These include raw fruits and vegetables, whole grains, beans, and nuts. Choose lean meats. Avoid canned, processed, and fried foods.  Drink enough fluids to keep your pee (urine) clear or pale yellow.  Weigh yourself every day.  Rest and limit activity as told by your doctor. You may be told to:  Stop any activity if you have chest pain, shortness of breath, changes in heartbeat, or dizziness. Get help right away if this happens.  Move around often for short amounts of time or take short walks as told by your doctor. Gradually become more active. You may need help to strengthen your muscles and build endurance.  Avoid lifting, pushing, or pulling anything heavier than 10 pounds (4.5 kg) for at least 6 weeks after surgery.  Do not drive until your doctor says  it is okay.  Ask your doctor when you can go back to work.  Ask your doctor when you can begin sexual activity again.  Follow up with your doctor as told. GET HELP IF:  You have puffiness, redness, more pain, or fluid draining from the incision site.  You have a fever.  You have puffiness in your ankles or legs.  You have pain in your legs.  You gain 2 or more pounds (0.9 kg) a day.  You feel sick to your stomach (nauseous) or throw up (vomit).  You have watery poop (diarrhea). GET HELP RIGHT AWAY IF:  You have chest pain that goes to your jaw or arms.  You have shortness of breath.  You have a fast or irregular heartbeat.  You notice a "clicking" in your breastbone when you move.  You have numbness or weakness in your arms or legs.  You feel dizzy or light-headed. MAKE SURE YOU:  Understand these instructions.  Will watch your condition.  Will get help right away if you are not doing well or get worse.   This information  is not intended to replace advice given to you by your health care provider. Make sure you discuss any questions you have with your health care provider.   Document Released: 12/26/2012 Document Reviewed: 12/26/2012 Elsevier Interactive Patient Education [employer].

## 2014-12-15 NOTE — Discharge Summary (Signed)
Physician Discharge Summary  Patient ID: Eric [name] MRN: [id number] DOB/AGE: 79-Apr-1932 79 y.o.  Admit date: 12/09/2014 Discharge date: 12/15/2014  Admission Diagnoses:CAD/AS  Discharge Diagnoses:  Active Problems:   S/P AVR  Patient Active Problem List   Diagnosis Date Noted  . S/P AVR 12/09/2014  . Coronary artery disease involving native coronary artery of native heart with unstable angina pectoris ([place])   . ESRD (end stage renal disease) ([place]) 07/15/2014  . Insomnia, unspecified 09/24/2013  . Pain in neck 09/24/2013  . Cough 06/04/2013  . Gout 06/04/2013  . ESRD on dialysis ([place]) 06/04/2013  . Essential hypertension   . GERD (gastroesophageal reflux disease)   . Herpes zoster 06/04/2008    HPI:at time of consultation:  The patient is an 81 year old gentleman with HTN and ESRD on HD who reports progressive exertional fatigue and shortness of breath over the past year or so. He had remained fairly active despite being on dialysis for the past year. He had a dialysis catheter in for a while and could not exercise while that was in. It was removed earlier this month and when he tried to exercise he developed shortness of breath, fatigue and chest tightness quickly. He had an ECG that showed new inferior changes and was referred to cardiology. Cardiac cath on 11/27/2014 showed severe 3-vessel coronary disease. The proximal and mid LAD were heavily calcified with 80% proximal and 99% mid LAD stenosis followed by a 90% mid stenosis. The LCX had a 60% stenosis in a large OM2 and an occluded OM3 that long but small in diameter or underfilled by the collat from the LAD. The RCA is occluded distally with faint filling of a small diameter PDA branch by collat from the LAD. He also had a heart murmur on exam and an echo showed at least moderate AS with a mean gradient of 36 mm Hg and a DI of 0.21. LVEF was reduced to 35% with anterior, septal and inferior wall motion abnormalities.   He  moved here from [place] and lives with his wife at [facility]. He has a daughter who lives locally and two other children who live in other places but are in on a conference call with [place] today. His daughter reports that her mother is showing signs of some dementia with memory loss and repeating things frequently. They have both lived independently.  He was admitted electively for the procedure.   Discharged Condition: good  Hospital Course: The patient was admitted electively and underwent procedure as described below. He tolerated well was taken to the surgical intensive care unit in stable condition. Postoperatively he has progressed nicely. The nephrologists have been assisting in the management of his dialysis. He did initially require some inotropic support but this was able to be weaned over time without difficulty. All routine lines, monitors and drainage devices have been discontinued in the standard fashion. He has had no significant postoperative cardiac dysrhythmias. Incisions are noted to be healing well without evidence of infection. He is tolerating gradually increasing activities using standard postoperative protocols. Oxygen has been weaned and he maintains good saturations on room air. He is tolerating diet. He does have a postoperative multifactorial anemia and is currently on Aranesp managed by the nephrologist. His overall status is felt to be tentatively stable for transfer back to the assisted living facility in the next 24-48 hours pending ongoing reevaluation of his recovery.  Consults: nephrology  Significant Diagnostic Studies: Routine postoperative serial chest x-ray and  labs.  Treatments:  surgery:   CARDIOVASCULAR SURGERY OPERATIVE NOTE  12/09/2014  Surgeon: Gaye Pollack, MD  First Assistant: Jadene Pierini, PA-C   Preoperative Diagnosis: Severe multi-vessel coronary artery  disease, Moderate aortic stenosis.   Postoperative Diagnosis: Same   Procedure:  1. Median Sternotomy 2. Extracorporeal circulation 3. Coronary artery bypass grafting x 3   Left internal mammary graft to the LAD  SVG to OM2  SVG to PDA  4. Endoscopic vein harvest from the right leg 5. Aortic valve replacement using a 23 mm Edwards Magna-Ease pericardial valve   Anesthesia: General Endotracheal   Discharge Exam: Blood pressure 143/63, pulse 96, temperature 98.2 F (36.8 C), temperature source Oral, resp. rate 18, height 5' 8.5" (1.74 m), weight 173 lb 11.2 oz (78.79 kg), SpO2 98 %.   General appearance: alert, cooperative and no distress Heart: regular rate and rhythm Lungs: dim in bases Abdomen: benign Extremities: +R>L LE edema Wound: incis healing well   Disposition:assisted living   medications at time of discharge:   Medication List    TAKE these medications        allopurinol 100 MG tablet  Commonly known as:  ZYLOPRIM  Take 1 tablet (100 mg total) by mouth daily.     amLODipine 10 MG tablet  Commonly known as:  NORVASC  Take one tablet by mouth once daily to control blood pressure     aspirin EC 81 MG tablet  Take 81 mg by mouth at bedtime.     atorvastatin 20 MG tablet  Commonly known as:  LIPITOR  Take one tablet by mouth once daily for cholesterol     calcium carbonate 500 MG chewable tablet  Commonly known as:  TUMS - dosed in mg elemental calcium  Chew 2 tablets by mouth 3 (three) times daily with meals.     metoprolol tartrate 25 MG tablet  Commonly known as:  LOPRESSOR  Take 0.5 tablets (12.5 mg total) by mouth 2 (two) times daily.     multivitamin Tabs tablet  Take 1 tablet by mouth daily.     omeprazole 20 MG capsule  Commonly known as:  PRILOSEC  Take 20 mg by mouth 4 (four) times a week. Sunday, Tuesday, Thursday and Saturday     oxyCODONE 5 MG immediate release tablet  Commonly known as:  Oxy IR/ROXICODONE   Take 1-2 tablets (5-10 mg total) by mouth every 6 (six) hours as needed for moderate pain or severe pain.     Vitamin D (Ergocalciferol) 50000 UNITS Caps capsule  Commonly known as:  DRISDOL  Take 1 capsule (50,000 Units total) by mouth every 14 (fourteen) days. 1st and 15th of each month             Follow-up Information    Follow up with Gaye Pollack, MD.   Specialty:  Cardiothoracic Surgery   Why:  4 weeks- the office will contact you. Please obtain a chest x-ray [facility] imaging one half hour prior to appointment. [facility] is located in the same office complex.   Contact information:   [address] [place] [address] [phone]       Follow up with Lauree Chandler, MD.   Specialty:  Cardiology   Why:  2 week cardiology appt, will contact you   Contact information:   [address]  [address] [phone]      The patient has been discharged on:   1.Beta Blocker:  Yes [ y  ]                              No   [   ]                              If No, reason:  2.Ace Inhibitor/ARB: Yes [   ]                                     No  [ n   ]                                     If No, reason:renal failure, low BP at times  3.Statin:   Yes Blue.Reese   ]                  No  [   ]                  If No, reason:  4.Ecasa:  Yes  [  y ]                  No   [   ]                  If No, reason:  Signed: Negan Grudzien E 12/15/2014, 10:07 AM

## 2014-12-15 NOTE — Progress Notes (Signed)
[facility] Progress Note  Assessment: 1. S/P AVR/CABG X 3 per Dr. Cyndia Bent: Doing well. Anticipate DC home Monday. 2. ESRD -TTS NW. HD yest, at dry wt . Anemia - Hgb 7.9. On ESA. Follow CBC. On Venofer.  4. Secondary hyperparathyroidism - C Ca 9.26. No binders/Vit D at present.  5. HTN/volume - stable 6. Nutrition -Albumin 2.3. On renal/carb mod diet. Appetite good. 7. DM: per primary  Plan - ok for dc tomorrow to resume OP HD on Tuesday. Have d/w prim MD. Would lower dry wt in OP setting as he appears to have lost some body weight.   Kelly Splinter MD [facility] pager [phone]    cell [phone] 12/15/2014, 9:24 AM    Subjective: Stable, walking w PT , no DOE   Objective Filed Vitals:   12/14/14 1930 12/15/14 0537 12/15/14 0900 12/15/14 0912  BP: 125/65 118/58 122/62 143/63  Pulse:  84 97 96  Temp: 99.4 F (37.4 C) 98.2 F (36.8 C)    TempSrc: Oral Oral    Resp: 18 18    Height:      Weight:  78.79 kg (173 lb 11.2 oz)    SpO2: 96% 98%     Physical Exam General: elderly male, very pleasant in NAD Heart: S1, S2, plus aortic valve click. No JVD, No edema. Lungs: Bilateral breath sounds CTA A/P Abdomen: Abdomen soft,nontender.  Extremities: No LE edema. Dialysis Access: RFA AVF + thrill/+ bruit  Dialysis Orders: Dialysis Orders: Center: [facility] on TTS . EDW 78.5 kg HD Bath 2.0k, 2.0Ca Time 4.0 hrs Heparin 3000. Access R FA AVF  Hectorol 12 mcg IV/HD Mircera 225 q 2 wks (last on 12/05/14) Venofer 100mg  load q hd thru 12/14/14 then 50mg  weekly thrus hd  Other op labs HGB 10.0 12/05/14 Ca 10.0 phos 5.6 pth 304  Additional Objective Labs: Basic Metabolic Panel:  Recent Labs Lab 12/10/14 1630  12/11/14 0450 12/13/14 0530 12/14/14 0240  NA 136  < > 134* 137 137  K 3.7  < > 3.7 2.9* 3.6  CL 98*  < > 96* 102 100*  CO2 27  --  28 27 28   GLUCOSE 129*  < > 159* 110* 100*  BUN 15  < > 24* 28* 24*  CREATININE 3.90*  3.82*  < > 4.83*  4.52* 5.12*  CALCIUM 8.3*  --  8.0* 8.3* 8.6*  PHOS 5.1*  --   --  3.2  --   < > = values in this interval not displayed. Liver Function Tests:  Recent Labs Lab 12/10/14 1630 12/13/14 0530  ALBUMIN 2.7* 2.3*   No results for input(s): LIPASE, AMYLASE in the last 168 hours. CBC:  Recent Labs Lab 12/10/14 0400 12/10/14 1630  12/11/14 0450 12/13/14 0530 12/14/14 0240  WBC 14.4* 12.1*  --  10.4 5.9 6.4  HGB 9.3* 8.7*  < > 8.3* 7.8* 7.9*  HCT 30.9* 28.9*  < > 27.4* 23.8* 24.7*  MCV 107.7* 107.8*  --  108.3* 101.3* 103.8*  PLT 156 133*  --  132* 112* 122*  < > = values in this interval not displayed. Blood Culture No results found for: SDES, SPECREQUEST, CULT, REPTSTATUS  Cardiac Enzymes: No results for input(s): CKTOTAL, CKMB, CKMBINDEX, TROPONINI in the last 168 hours. CBG:  Recent Labs Lab 12/13/14 2106 12/14/14 0902 12/14/14 1617 12/14/14 2119 12/15/14 0622  GLUCAP 93 104* 136* 132* 101*   Iron Studies: No results for input(s): IRON, TIBC, TRANSFERRIN, FERRITIN in the  last 72 hours. @lablastinr3 @ Studies/Results: Dg Chest 2 View  12/14/2014  CLINICAL DATA:  Sternotomy wires overlie normal cardiac silhouette. EXAM: CHEST  2 VIEW COMPARISON:  12/11/2014 FINDINGS: Sternotomy wires overlie normal cardiac silhouette. Bilateral pleural effusions noted. Improved LEFT basilar atelectasis. Removal of RIGHT IJ sheath. No pneumothorax. IMPRESSION: Bilateral pleural effusions. Improvement in LEFT basilar atelectasis. Electronically Signed   By: Suzy Bouchard M.D.   On: 12/14/2014 09:03   Medications:   . aspirin EC  81 mg Oral Daily  . atorvastatin  20 mg Oral q1800  . darbepoetin (ARANESP) injection - DIALYSIS  100 mcg Intravenous Q Thu-HD  . docusate sodium  200 mg Oral Daily  . insulin aspart  0-24 Units Subcutaneous TID AC & HS  . pantoprazole  40 mg Oral QAC breakfast  . sodium chloride  3 mL Intravenous Q12H

## 2014-12-15 NOTE — Progress Notes (Signed)
Removed epicardial pacing wires per order without difficulty.  Pt had no complaints during or following procedure.  VS remained stable.  Sites were dry/intact.  Chest tube sutures were left in place with dry 2x2 gauzes over sites.  Pt remained on bedrest for one hour following removal.  Will continue to monitor.

## 2014-12-15 NOTE — Progress Notes (Addendum)
[address]       [place] [address]             [phone]      6 Days Post-Op Procedure(s) (LRB): AORTIC VALVE REPLACEMENT (AVR) WITH 23MM MAGNA EASE BIOPROSTHETIC VALVE (N/A) CORONARY ARTERY BYPASS GRAFTING (CABG), ON PUMP, TIMES THREE, USING LEFT INTERNAL MAMMARY ARTERY, RIGHT GREATER SAPHENOUS VEIN HARVESTED ENDOSCOPICALLY (N/A) TRANSESOPHAGEAL ECHOCARDIOGRAM (TEE) (N/A) Subjective: Feels well, no new complaints  Objective: Vital signs in last 24 hours: Temp:  [97.5 F (36.4 C)-99.4 F (37.4 C)] 98.2 F (36.8 C) (12/11 0537) Pulse Rate:  [78-90] 84 (12/11 0537) Cardiac Rhythm:  [-] Sinus tachycardia;Bundle branch block (12/10 1945) Resp:  [17-20] 18 (12/11 0537) BP: (95-126)/(49-70) 118/58 mmHg (12/11 0537) SpO2:  [96 %-98 %] 98 % (12/11 0537) Weight:  [173 lb 11.2 oz (78.79 kg)-176 lb 5.9 oz (80 kg)] 173 lb 11.2 oz (78.79 kg) (12/11 0537)  Hemodynamic parameters for last 24 hours:    Intake/Output from previous day: 12/10 0701 - 12/11 0700 In: 120 [P.O.:120] Out: 500  Intake/Output this shift:    General appearance: alert, cooperative and no distress Heart: regular rate and rhythm Lungs: dim in lower fields Abdomen: benign Extremities: + minor LE edema Wound: incis healing well Heart cont.- soft systolic murmur  Lab Results:  Recent Labs  12/13/14 0530 12/14/14 0240  WBC 5.9 6.4  HGB 7.8* 7.9*  HCT 23.8* 24.7*  PLT 112* 122*   BMET:  Recent Labs  12/13/14 0530 12/14/14 0240  NA 137 137  K 2.9* 3.6  CL 102 100*  CO2 27 28  GLUCOSE 110* 100*  BUN 28* 24*  CREATININE 4.52* 5.12*  CALCIUM 8.3* 8.6*    PT/INR: No results for input(s): LABPROT, INR in the last 72 hours. ABG    Component Value Date/Time   PHART 7.438 12/10/2014 0006   HCO3 24.7* 12/10/2014 0006   TCO2 28 12/10/2014 1636   ACIDBASEDEF 1.0 12/09/2014 2256   O2SAT 97.0 12/10/2014 0006   CBG (last 3)   Recent Labs  12/14/14 1617 12/14/14 2119  12/15/14 0622  GLUCAP 136* 132* 101*    Meds Scheduled Meds: . aspirin EC  81 mg Oral Daily  . atorvastatin  20 mg Oral q1800  . darbepoetin (ARANESP) injection - DIALYSIS  100 mcg Intravenous Q Thu-HD  . docusate sodium  200 mg Oral Daily  . insulin aspart  0-24 Units Subcutaneous TID AC & HS  . pantoprazole  40 mg Oral QAC breakfast  . sodium chloride  3 mL Intravenous Q12H   Continuous Infusions:  PRN Meds:.sodium chloride, sodium chloride, sodium chloride, alteplase, bisacodyl **OR** bisacodyl, guaiFENesin, heparin, lactulose, lidocaine (PF), lidocaine-prilocaine, ondansetron **OR** ondansetron (ZOFRAN) IV, oxyCODONE, pentafluoroprop-tetrafluoroeth, sodium chloride, traMADol  Xrays Dg Chest 2 View  12/14/2014  CLINICAL DATA:  Sternotomy wires overlie normal cardiac silhouette. EXAM: CHEST  2 VIEW COMPARISON:  12/11/2014 FINDINGS: Sternotomy wires overlie normal cardiac silhouette. Bilateral pleural effusions noted. Improved LEFT basilar atelectasis. Removal of RIGHT IJ sheath. No pneumothorax. IMPRESSION: Bilateral pleural effusions. Improvement in LEFT basilar atelectasis. Electronically Signed   By: Suzy Bouchard M.D.   On: 12/14/2014 09:03    Assessment/Plan: S/P Procedure(s) (LRB): AORTIC VALVE REPLACEMENT (AVR) WITH 23MM MAGNA EASE BIOPROSTHETIC VALVE (N/A) CORONARY ARTERY BYPASS GRAFTING (CABG), ON PUMP, TIMES THREE, USING LEFT INTERNAL MAMMARY ARTERY, RIGHT GREATER SAPHENOUS VEIN HARVESTED ENDOSCOPICALLY (N/A) TRANSESOPHAGEAL ECHOCARDIOGRAM (TEE) (N/A)  1 conts with excellent overall progress 2  hemodyn stable in sinus with BBB 3 small pleural effusions- monitor 4 d/c epw's 5 renal service managing dialysis 6 poss return to [facility] in am if no significant changes    LOS: 6 days    GOLD,WAYNE E 12/15/2014  Plan d/c tomorrow, patient wants to go home to his apartment not rehab I have seen and examined [name] and agree with the above assessment  and  plan.  Grace Isaac MD [facility] [phone] Office [phone] 12/15/2014 9:33 AM

## 2014-12-15 NOTE — Progress Notes (Signed)
Patient ambulated approx. 800 ft using a RW on RA with minimal assistance. Patient tolerated well, VSS, no complaints this time, patient back in bed with call bel in reach.

## 2014-12-15 NOTE — Clinical Social Work Note (Signed)
CSW consult acknowledged:  Clinical Social Worker received consult indicating that patient is from [facility]. CSW met with patient and wife at bedside who confirmed patient is from [facility]. PT is currently recommending Home Health. Patient denies any social work needs and plans to return to [facility], 12/12.   Clinical Social Worker will sign off for now as social work intervention is no longer needed. Please consult [place] again if new need arises.  Glendon Axe, MSW, LCSWA [phone] 12/15/2014 1:27 PM

## 2014-12-16 LAB — TYPE AND SCREEN
ABO/RH(D): A POS
ANTIBODY SCREEN: NEGATIVE
UNIT DIVISION: 0
UNIT DIVISION: 0
UNIT DIVISION: 0
Unit division: 0
Unit division: 0
Unit division: 0

## 2014-12-16 LAB — GLUCOSE, CAPILLARY: Glucose-Capillary: 100 mg/dL — ABNORMAL HIGH (ref 65–99)

## 2014-12-16 MED ORDER — METOPROLOL TARTRATE 25 MG PO TABS: 12.5000 mg | ORAL_TABLET | Freq: Two times a day (BID) | ORAL | Status: DC

## 2014-12-16 MED ORDER — OXYCODONE HCL 5 MG PO TABS: 5.0000 mg | ORAL_TABLET | Freq: Four times a day (QID) | ORAL | Status: DC | PRN

## 2014-12-16 NOTE — Care Management (Signed)
CM contacted [facility] to determine if the recommended [facility] PT will be provide by in house therapy on site.  CM was informed that PT is offered at community, CM left voice mail with [place] to provide referral.

## 2014-12-16 NOTE — Progress Notes (Signed)
CARDIAC REHAB PHASE I   PRE:  Rate/Rhythm: 93 SR  BP:  Supine:   Sitting: 143/61  Standing:    SaO2: 96%RA  MODE:  Ambulation: 550 ft   POST:  Rate/Rhythm: 98 SR  BP:  Supine:   Sitting: 130/53  Standing:    SaO2: 96%RA 0825-0902 Pt walked 550 ft with rolling walker independently with steady gait. Tolerated well. Re enforced sternal precautions. Education completed with pt who voiced understanding. Did not go over diet as he is on renal diet and sees HD dietitian. Discussed CRP 2 but pt declined due to HD and [facility] equipment [facility]. Offered to show discharge video but pt declined.   Graylon Good, RN BSN  12/16/2014 8:59 AM

## 2014-12-16 NOTE — Care Management Note (Addendum)
Case Management Note  Patient Details  Name: Eric [name] MRN: [id number] Date of Birth: 1930-03-20  Subjective/Objective:    Pt is s/p AVR   Action/Plan:  Pt is from [place] with wife and will return there upon discharge.   CM contacted community and was informed that [facility] will provide HHPT as recommended.  CM contacted agency and left voice mail, faxed order and demo sheet to [phone].   Expected Discharge Date:                  Expected Discharge Plan:  Skilled Nursing Facility  In-House Referral:  Clinical Social Work  Discharge planning Services  CM Consult  Post Acute Care Choice:    Choice offered to:  Patient  DME Arranged:  [facility] rolling DME Agency:  [place]:  PT ([facility] (on site at [place]) [place]:     Status of Service:  Completed, signed off  Medicare Important Message Given:  Yes Date Medicare IM Given:    Medicare IM give by:    Date Additional Medicare IM Given:    Additional Medicare Important Message give by:     If discussed at [place] of Stay Meetings, dates discussed:    Additional Comments: Ruthe Mannan (therapy coordinator at [facility])  contacted CM and confirmed that fax has been received and referral has been accepted.  CM contacted [place] at [phone] left an additional voice mail informing of faxed order and requesting phone follow up to confirm referral was accepted.  CM spoke with pt; pt is in agreement to use predetermined [place] agency of retirement community, CM informed pt that contact had been made with agency for referral and that order had been faxed.  CM offered DME choice, pt chose [facility], agency contacted and referral was accepted  CM informed pt and specified on AVS that if he isn't contacted by agency to contact them and request the HHPT.[facility] order. Maryclare Labrador, RN 12/16/2014,  8:29 AM

## 2014-12-16 NOTE — Progress Notes (Signed)
      [address]       [place] [address]             [phone]      7 Days Post-Op Procedure(s) (LRB): AORTIC VALVE REPLACEMENT (AVR) WITH 23MM MAGNA EASE BIOPROSTHETIC VALVE (N/A) CORONARY ARTERY BYPASS GRAFTING (CABG), ON PUMP, TIMES THREE, USING LEFT INTERNAL MAMMARY ARTERY, RIGHT GREATER SAPHENOUS VEIN HARVESTED ENDOSCOPICALLY (N/A) TRANSESOPHAGEAL ECHOCARDIOGRAM (TEE) (N/A) Subjective: conts to feel well without specific c/o  Objective: Vital signs in last 24 hours: Temp:  [97.4 F (36.3 C)-98.7 F (37.1 C)] 97.6 F (36.4 C) (12/12 0300) Pulse Rate:  [77-97] 77 (12/12 0300) Cardiac Rhythm:  [-] Normal sinus rhythm;Bundle branch block (12/12 0300) Resp:  [17-18] 18 (12/12 0300) BP: (111-143)/(54-63) 139/58 mmHg (12/12 0300) SpO2:  [98 %-99 %] 99 % (12/12 0300) Weight:  [174 lb 14.4 oz (79.334 kg)] 174 lb 14.4 oz (79.334 kg) (12/12 0500)  Hemodynamic parameters for last 24 hours:    Intake/Output from previous day:   Intake/Output this shift:    General appearance: alert, cooperative and no distress Heart: regular rate and rhythm Lungs: dim in bases Abdomen: benign Extremities: +R>L LE edema Wound: incis healing well   Lab Results:  Recent Labs  12/14/14 0240  WBC 6.4  HGB 7.9*  HCT 24.7*  PLT 122*   BMET:  Recent Labs  12/14/14 0240  NA 137  K 3.6  CL 100*  CO2 28  GLUCOSE 100*  BUN 24*  CREATININE 5.12*  CALCIUM 8.6*    PT/INR: No results for input(s): LABPROT, INR in the last 72 hours. ABG    Component Value Date/Time   PHART 7.438 12/10/2014 0006   HCO3 24.7* 12/10/2014 0006   TCO2 28 12/10/2014 1636   ACIDBASEDEF 1.0 12/09/2014 2256   O2SAT 97.0 12/10/2014 0006   CBG (last 3)   Recent Labs  12/15/14 1626 12/15/14 2115 12/16/14 0653  GLUCAP 129* 100* 100*    Meds Scheduled Meds: . aspirin EC  81 mg Oral Daily  . atorvastatin  20 mg Oral q1800  . darbepoetin (ARANESP) injection - DIALYSIS  100 mcg  Intravenous Q Thu-HD  . docusate sodium  200 mg Oral Daily  . insulin aspart  0-24 Units Subcutaneous TID AC & HS  . metoprolol tartrate  12.5 mg Oral BID  . pantoprazole  40 mg Oral QAC breakfast  . sodium chloride  3 mL Intravenous Q12H   Continuous Infusions:  PRN Meds:.sodium chloride, sodium chloride, sodium chloride, alteplase, bisacodyl **OR** bisacodyl, guaiFENesin, heparin, lactulose, lidocaine (PF), lidocaine-prilocaine, ondansetron **OR** ondansetron (ZOFRAN) IV, oxyCODONE, pentafluoroprop-tetrafluoroeth, sodium chloride, traMADol  Xrays No results found.  Assessment/Plan: S/P Procedure(s) (LRB): AORTIC VALVE REPLACEMENT (AVR) WITH 23MM MAGNA EASE BIOPROSTHETIC VALVE (N/A) CORONARY ARTERY BYPASS GRAFTING (CABG), ON PUMP, TIMES THREE, USING LEFT INTERNAL MAMMARY ARTERY, RIGHT GREATER SAPHENOUS VEIN HARVESTED ENDOSCOPICALLY (N/A) TRANSESOPHAGEAL ECHOCARDIOGRAM (TEE) (N/A)   1 conts t make good progress 2 stable for discharge   LOS: 7 days    GOLD,WAYNE E 12/16/2014

## 2014-12-16 NOTE — Progress Notes (Signed)
Chest Tube sutures remover per order and per protocol. Painted with Benzoin, stri-strips applied. Pt tolerated well. Will continue to monitor.

## 2014-12-17 DIAGNOSIS — N186 End stage renal disease: Secondary | ICD-10-CM | POA: Diagnosis not present

## 2014-12-17 DIAGNOSIS — N2581 Secondary hyperparathyroidism of renal origin: Secondary | ICD-10-CM | POA: Diagnosis not present

## 2014-12-17 DIAGNOSIS — D509 Iron deficiency anemia, unspecified: Secondary | ICD-10-CM | POA: Diagnosis not present

## 2014-12-17 DIAGNOSIS — D631 Anemia in chronic kidney disease: Secondary | ICD-10-CM | POA: Diagnosis not present

## 2014-12-17 DIAGNOSIS — N184 Chronic kidney disease, stage 4 (severe): Secondary | ICD-10-CM | POA: Diagnosis not present

## 2014-12-17 NOTE — Care Management Important Message (Signed)
Important Message  Patient Details  Name: Eric [name] MRN: [id number] Date of Birth: May 24, 1930   Medicare Important Message Given:  Yes   IM was given on 12/16/14    Nathen May 12/17/2014, 10:00 AM

## 2014-12-18 DIAGNOSIS — R278 Other lack of coordination: Secondary | ICD-10-CM | POA: Diagnosis not present

## 2014-12-18 DIAGNOSIS — M6281 Muscle weakness (generalized): Secondary | ICD-10-CM | POA: Diagnosis not present

## 2014-12-18 DIAGNOSIS — Z48812 Encounter for surgical aftercare following surgery on the circulatory system: Secondary | ICD-10-CM | POA: Diagnosis not present

## 2014-12-18 DIAGNOSIS — R2689 Other abnormalities of gait and mobility: Secondary | ICD-10-CM | POA: Diagnosis not present

## 2014-12-19 DIAGNOSIS — N184 Chronic kidney disease, stage 4 (severe): Secondary | ICD-10-CM | POA: Diagnosis not present

## 2014-12-19 DIAGNOSIS — D631 Anemia in chronic kidney disease: Secondary | ICD-10-CM | POA: Diagnosis not present

## 2014-12-19 DIAGNOSIS — N2581 Secondary hyperparathyroidism of renal origin: Secondary | ICD-10-CM | POA: Diagnosis not present

## 2014-12-19 DIAGNOSIS — D509 Iron deficiency anemia, unspecified: Secondary | ICD-10-CM | POA: Diagnosis not present

## 2014-12-19 DIAGNOSIS — N186 End stage renal disease: Secondary | ICD-10-CM | POA: Diagnosis not present

## 2014-12-20 DIAGNOSIS — M6281 Muscle weakness (generalized): Secondary | ICD-10-CM | POA: Diagnosis not present

## 2014-12-20 DIAGNOSIS — Z48812 Encounter for surgical aftercare following surgery on the circulatory system: Secondary | ICD-10-CM | POA: Diagnosis not present

## 2014-12-20 DIAGNOSIS — R2689 Other abnormalities of gait and mobility: Secondary | ICD-10-CM | POA: Diagnosis not present

## 2014-12-20 DIAGNOSIS — R278 Other lack of coordination: Secondary | ICD-10-CM | POA: Diagnosis not present

## 2014-12-21 DIAGNOSIS — N2581 Secondary hyperparathyroidism of renal origin: Secondary | ICD-10-CM | POA: Diagnosis not present

## 2014-12-21 DIAGNOSIS — N184 Chronic kidney disease, stage 4 (severe): Secondary | ICD-10-CM | POA: Diagnosis not present

## 2014-12-21 DIAGNOSIS — D631 Anemia in chronic kidney disease: Secondary | ICD-10-CM | POA: Diagnosis not present

## 2014-12-21 DIAGNOSIS — D509 Iron deficiency anemia, unspecified: Secondary | ICD-10-CM | POA: Diagnosis not present

## 2014-12-21 DIAGNOSIS — N186 End stage renal disease: Secondary | ICD-10-CM | POA: Diagnosis not present

## 2014-12-23 DIAGNOSIS — R278 Other lack of coordination: Secondary | ICD-10-CM | POA: Diagnosis not present

## 2014-12-23 DIAGNOSIS — M6281 Muscle weakness (generalized): Secondary | ICD-10-CM | POA: Diagnosis not present

## 2014-12-23 DIAGNOSIS — R2689 Other abnormalities of gait and mobility: Secondary | ICD-10-CM | POA: Diagnosis not present

## 2014-12-23 DIAGNOSIS — Z48812 Encounter for surgical aftercare following surgery on the circulatory system: Secondary | ICD-10-CM | POA: Diagnosis not present

## 2014-12-24 ENCOUNTER — Encounter: Payer: Self-pay | Admitting: Interventional Cardiology

## 2014-12-24 DIAGNOSIS — N184 Chronic kidney disease, stage 4 (severe): Secondary | ICD-10-CM | POA: Diagnosis not present

## 2014-12-24 DIAGNOSIS — N2581 Secondary hyperparathyroidism of renal origin: Secondary | ICD-10-CM | POA: Diagnosis not present

## 2014-12-24 DIAGNOSIS — D631 Anemia in chronic kidney disease: Secondary | ICD-10-CM | POA: Diagnosis not present

## 2014-12-24 DIAGNOSIS — N186 End stage renal disease: Secondary | ICD-10-CM | POA: Diagnosis not present

## 2014-12-24 DIAGNOSIS — D509 Iron deficiency anemia, unspecified: Secondary | ICD-10-CM | POA: Diagnosis not present

## 2014-12-26 DIAGNOSIS — N2581 Secondary hyperparathyroidism of renal origin: Secondary | ICD-10-CM | POA: Diagnosis not present

## 2014-12-26 DIAGNOSIS — D631 Anemia in chronic kidney disease: Secondary | ICD-10-CM | POA: Diagnosis not present

## 2014-12-26 DIAGNOSIS — D509 Iron deficiency anemia, unspecified: Secondary | ICD-10-CM | POA: Diagnosis not present

## 2014-12-26 DIAGNOSIS — N186 End stage renal disease: Secondary | ICD-10-CM | POA: Diagnosis not present

## 2014-12-26 DIAGNOSIS — N184 Chronic kidney disease, stage 4 (severe): Secondary | ICD-10-CM | POA: Diagnosis not present

## 2014-12-27 ENCOUNTER — Ambulatory Visit (INDEPENDENT_AMBULATORY_CARE_PROVIDER_SITE_OTHER): Payer: Medicare Other | Admitting: Nurse Practitioner

## 2014-12-27 ENCOUNTER — Encounter: Payer: Self-pay | Admitting: Nurse Practitioner

## 2014-12-27 VITALS — BP 132/58 | HR 78 | Ht 68.5 in | Wt 174.8 lb

## 2014-12-27 DIAGNOSIS — Z951 Presence of aortocoronary bypass graft: Secondary | ICD-10-CM | POA: Diagnosis not present

## 2014-12-27 DIAGNOSIS — R011 Cardiac murmur, unspecified: Secondary | ICD-10-CM

## 2014-12-27 DIAGNOSIS — Z954 Presence of other heart-valve replacement: Secondary | ICD-10-CM | POA: Diagnosis not present

## 2014-12-27 DIAGNOSIS — Z952 Presence of prosthetic heart valve: Secondary | ICD-10-CM

## 2014-12-27 DIAGNOSIS — I2510 Atherosclerotic heart disease of native coronary artery without angina pectoris: Secondary | ICD-10-CM

## 2014-12-27 LAB — BASIC METABOLIC PANEL
BUN: 33 mg/dL — ABNORMAL HIGH (ref 7–25)
CO2: 30 mmol/L (ref 20–31)
Calcium: 9.6 mg/dL (ref 8.6–10.3)
Chloride: 100 mmol/L (ref 98–110)
Creat: 4.92 mg/dL — ABNORMAL HIGH (ref 0.70–1.11)
Glucose, Bld: 140 mg/dL — ABNORMAL HIGH (ref 65–99)
Potassium: 4.5 mmol/L (ref 3.5–5.3)
Sodium: 139 mmol/L (ref 135–146)

## 2014-12-27 LAB — CBC
HCT: 23.3 % — ABNORMAL LOW (ref 39.0–52.0)
Hemoglobin: 7.6 g/dL — ABNORMAL LOW (ref 13.0–17.0)
MCH: 30.6 pg (ref 26.0–34.0)
MCHC: 32.6 g/dL (ref 30.0–36.0)
MCV: 94 fL (ref 78.0–100.0)
MPV: 9.4 fL (ref 8.6–12.4)
Platelets: 340 10*3/uL (ref 150–400)
RBC: 2.48 MIL/uL — ABNORMAL LOW (ref 4.22–5.81)
RDW: 16.2 % — ABNORMAL HIGH (ref 11.5–15.5)
WBC: 7.1 10*3/uL (ref 4.0–10.5)

## 2014-12-27 NOTE — Patient Instructions (Addendum)
We will be checking the following labs today - BMET and CBC   Medication Instructions:    Continue with your current medicines.     Testing/Procedures To Be Arranged:  Echocardiogram.   Follow-Up:   See me in 1 month   Other Special Instructions:   N/A    If you need a refill on your cardiac medications before your next appointment, please call your pharmacy.   Call the [place] office at [phone] if you have any questions, problems or concerns.

## 2014-12-27 NOTE — Progress Notes (Signed)
CARDIOLOGY OFFICE NOTE  Date:  12/27/2014    Eric [name] Date of Birth: [birth year] Medical Record [id number]  PCP:  Hollace Kinnier, DO  Cardiologist:  Caryl Comes    Chief Complaint  Patient presents with  . Post op CABG and AVR    Seen for Dr. Caryl Comes    History of Present Illness: Eric [name] is a 81 y.o. male who presents today for a post hospital visit. Seen for Dr. Caryl Comes.   He has a history of ESRD on HD via AV fistula, insomnia, HTN, and GERD. Reported stress test at [facility] in [place], [place] 2 summers ago - noted in the record that he "has a problem with the one chamber of his heart not being attached properly and he knows about it since childhood". No actual documentation of this. Reports history of cardiac arrest approximately 2 years ago while having fistula procedure - sounds like he had a stress test at that time.   I saw him last month as a new patient for DOE - had abnormal EKG and murmur on exam. Ended up getting echo, CT (negative for PE) and cath.   Cardiac cath on 11/27/2014 showed severe 3-vessel coronary disease. The proximal and mid LAD were heavily calcified with 80% proximal and 99% mid LAD stenosis followed by a 90% mid stenosis. The LCX had a 60% stenosis in a large OM2 and an occluded OM3 that long but small in diameter or underfilled by the collat from the LAD. The RCA is occluded distally with faint filling of a small diameter PDA branch by collat from the LAD. His echo showed at least moderate AS with a mean gradient of 36 mm Hg and a DI of 0.21. LVEF was reduced to 35% with anterior, septal and inferior wall motion abnormalities.   He underwent CABG x 3 with LIMA to LAD, SVG to OM2 and SVG to PDA with AVR with #23 Edwards Magna-Ease pericardial valve. Nephrology assisted with his dialysis. He did initially require some inotropic support but this was able to be weaned over time without difficulty. He had no significant postoperative cardiac  dysrhythmias. He is on chronic Aranesp managed by the nephrologist.   Comes back today. Here with his wife. Doing ok. Has only been home about a week. Little puny but overall ok. Continues with his dialysis scheduled. Not short of breath. Walking with PT. Not interested in cardiac rehab. Stopped his pain medicine - sounds like he got too constipated. Back having a "cocktail" at night. Some sleep issues. No chest pain. Weight is stable. He notes "just want to hurry up and get my stamina back".    Past Medical History  Diagnosis Date  . CKD (chronic kidney disease) stage 5, GFR less than 15 ml/min (HCC)   . Unspecified essential hypertension   . GERD (gastroesophageal reflux disease)   . Gout   . Herpes zoster 04/2012  . Heart murmur     as a teen  . Anxiety   . Arthritis   . Complication of anesthesia     " one time my heart stopped due to a medication that I was on."   . Shortness of breath dyspnea     with exertion  . Hemodialysis patient [facility])     Tues, Thursday, Saturday    Past Surgical History  Procedure Laterality Date  . Appendectomy  1944  . Cataract extraction w/ intraocular lens implant Bilateral 2014    [place]  .  Dialysis fistula creation  08/04/2012 and 10/13    Dr. Harden [place]  . Colonoscopy  2013    Dr. Annamaria Helling [place], [place].  Marland Kitchen Av fistula placement Left     Left C[place]ino AVF placed in [place]   . Insertion of dialysis catheter Right 01-15-14    Right chest TDC placed by Dr. Augustin Coupe at [facility]  . Eye surgery      Cataract  . Av fistula placement Right 03/06/2014    Procedure: ARTERIOVENOUS (AV) FISTULA CREATION-right radiocephalic;  Surgeon: Mal Misty, MD;  Location: [facility];  Service: Vascular;  Laterality: Right;  . Ligation of competing branches of arteriovenous fistula Right 05/08/2014    Procedure: LIGATION OF COMPETING BRANCHES OF RIGHT ARM RADIOCEPHALIC ARTERIOVENOUS FISTULA;  Surgeon: Mal Misty, MD;  Location: [facility];  Service:  Vascular;  Laterality: Right;  . Av fistula placement Right 07/15/2014    Procedure: ARTERIOVENOUS (AV) FISTULA CREATION;  Surgeon: Elam Dutch, MD;  Location: [facility] OR;  Service: Vascular;  Laterality: Right;  . Ligation of arteriovenous  fistula Right 07/15/2014    Procedure: LIGATION OF ARTERIOVENOUS  FISTULA  (RIGHT RADIOCEPHALIC);  Surgeon: Elam Dutch, MD;  Location: [facility];  Service: Vascular;  Laterality: Right;  . Revison of arteriovenous fistula Right 07/15/2014    Procedure: EXPLORATION OF ARTERIOVENOUS FISTULA;  Surgeon: Elam Dutch, MD;  Location: [facility];  Service: Vascular;  Laterality: Right;  . Cardiac catheterization N/A 11/27/2014    Procedure: Right/Left Heart Cath and Coronary Angiography;  Surgeon: Burnell Blanks, MD;  Location: [facility] CV LAB;  Service: Cardiovascular;  Laterality: N/A;  . Aortic valve replacement N/A 12/09/2014    Procedure: AORTIC VALVE REPLACEMENT (AVR) WITH 23MM MAGNA EASE BIOPROSTHETIC VALVE;  Surgeon: Gaye Pollack, MD;  Location: [facility] OR;  Service: Open Heart Surgery;  Laterality: N/A;  . Coronary artery bypass graft N/A 12/09/2014    Procedure: CORONARY ARTERY BYPASS GRAFTING (CABG), ON PUMP, TIMES THREE, USING LEFT INTERNAL MAMMARY ARTERY, RIGHT GREATER SAPHENOUS VEIN HARVESTED ENDOSCOPICALLY;  Surgeon: Gaye Pollack, MD;  Location: [facility] OR;  Service: Open Heart Surgery;  Laterality: N/A;  LIMA-LAD; SVG-OM; SVG-PD  . Tee without cardioversion N/A 12/09/2014    Procedure: TRANSESOPHAGEAL ECHOCARDIOGRAM (TEE);  Surgeon: Gaye Pollack, MD;  Location: [facility];  Service: Open Heart Surgery;  Laterality: N/A;     Medications: Current Outpatient Prescriptions  Medication Sig Dispense Refill  . allopurinol (ZYLOPRIM) 100 MG tablet Take 1 tablet (100 mg total) by mouth daily. 90 tablet 3  . amLODipine (NORVASC) 10 MG tablet Take one tablet by mouth once daily to control blood pressure 90 tablet 3  . aspirin EC 81 MG tablet Take 81 mg by mouth  at bedtime.     Marland Kitchen atorvastatin (LIPITOR) 20 MG tablet Take one tablet by mouth once daily for cholesterol 90 tablet 1  . calcium carbonate (TUMS - DOSED IN MG ELEMENTAL CALCIUM) 500 MG chewable tablet Chew 2 tablets by mouth 3 (three) times daily with meals.    . metoprolol tartrate (LOPRESSOR) 25 MG tablet Take 0.5 tablets (12.5 mg total) by mouth 2 (two) times daily. 31 tablet 1  . multivitamin (RENA-VIT) TABS tablet Take 1 tablet by mouth daily.     Marland Kitchen omeprazole (PRILOSEC) 20 MG capsule Take 20 mg by mouth 4 (four) times a week. Sunday, Tuesday, Thursday and Saturday    . Vitamin D, Ergocalciferol, (DRISDOL) 50000 UNITS CAPS capsule Take 1  capsule (50,000 Units total) by mouth every 14 (fourteen) days. 1st and 15th of each month 24 capsule 0   No current facility-administered medications for this visit.    Allergies: No Known Allergies  Social History: The patient  reports that he has never smoked. He has never used smokeless tobacco. He reports that he drinks about 1.2 oz of alcohol per week. He reports that he does not use illicit drugs.   Family History: The patient's family history includes Cancer in his father and mother.   Review of Systems: Please see the history of present illness.   Otherwise, the review of systems is positive for none.   All other systems are reviewed and negative.   Physical Exam: VS:  BP 132/58 mmHg  Pulse 78  Ht 5' 8.5" (1.74 m)  Wt 174 lb 12.8 oz (79.289 kg)  BMI 26.19 kg/m2 .  BMI Body mass index is 26.19 kg/(m^2).  Wt Readings from Last 3 Encounters:  12/27/14 174 lb 12.8 oz (79.289 kg)  12/16/14 174 lb 14.4 oz (79.334 kg)  12/06/14 171 lb 9.6 oz (77.837 kg)    General: Pleasant. Elderly male - looks chronically ill but in no acute distress.  HEENT: Normal. Neck: Supple, no JVD, carotid bruits, or masses noted.  Cardiac: Regular rate and rhythm. He has an outflow murmur noted. No edema.  Respiratory:  Lungs are clear to auscultation  bilaterally with normal work of breathing.  GI: Soft and nontender.  MS: No deformity or atrophy. Gait and ROM intact. Skin: Warm and dry. Color is a little sallow. Neuro:  Strength and sensation are intact and no gross focal deficits noted.  Psych: Alert, appropriate and with normal affect.   LABORATORY DATA:  EKG:  EKG is ordered today. This demonstrates NSR with IVCD and diffuse T wave changes.  Lab Results  Component Value Date   WBC 6.4 12/14/2014   HGB 7.9* 12/14/2014   HCT 24.7* 12/14/2014   PLT 122* 12/14/2014   GLUCOSE 100* 12/14/2014   CHOL 117 02/07/2013   TRIG 129 02/07/2013   HDL 36 02/07/2013   LDLCALC 55 02/07/2013   ALT 13* 12/06/2014   AST 13* 12/06/2014   NA 137 12/14/2014   K 3.6 12/14/2014   CL 100* 12/14/2014   CREATININE 5.12* 12/14/2014   BUN 24* 12/14/2014   CO2 28 12/14/2014   INR 1.42 12/09/2014   HGBA1C 5.2 12/06/2014    BNP (last 3 results) No results for input(s): BNP in the last 8760 hours.  ProBNP (last 3 results) No results for input(s): PROBNP in the last 8760 hours.   Other Studies Reviewed Today:   Assessment/Plan: 1. DOE - found to have AS as well as significant CAD - now s/p CABG and AVR. He is doing well. Not interested in going to cardiac rehab. Continue with therapy at [facility]. Needs follow up labs today. Needs echo updated. Reviewed activity level and need for SBE. See back in 4 weeks. Needs appointment with Dr. Cyndia Bent.   2. ESRD - on dialysis  3. Chronic Anemia - remains on Aranesp - managed by Ren[facility].   4. HLD - on statin therapy.  Current medicines are reviewed with the patient today.  The patient does not have concerns regarding medicines other than what has been noted above.  The following changes have been made:  See above.  Labs/ tests ordered today include:    Orders Placed This Encounter  Procedures  . Basic metabolic panel  .  CBC  . EKG 12-Lead  . ECHOCARDIOGRAM COMPLETE     Disposition:   FU  with me in 4 weeks.   Patient is agreeable to this plan and will call if any problems develop in the interim.   Signed: Burtis Junes, RN, ANP-C 12/27/2014 9:39 AM  [facility] [address] [place], [place]  [address] Phone: [phone] Fax: [phone]

## 2014-12-28 DIAGNOSIS — D631 Anemia in chronic kidney disease: Secondary | ICD-10-CM | POA: Diagnosis not present

## 2014-12-28 DIAGNOSIS — N186 End stage renal disease: Secondary | ICD-10-CM | POA: Diagnosis not present

## 2014-12-28 DIAGNOSIS — N184 Chronic kidney disease, stage 4 (severe): Secondary | ICD-10-CM | POA: Diagnosis not present

## 2014-12-28 DIAGNOSIS — N2581 Secondary hyperparathyroidism of renal origin: Secondary | ICD-10-CM | POA: Diagnosis not present

## 2014-12-28 DIAGNOSIS — D509 Iron deficiency anemia, unspecified: Secondary | ICD-10-CM | POA: Diagnosis not present

## 2014-12-31 ENCOUNTER — Telehealth: Payer: Self-pay | Admitting: Nurse Practitioner

## 2014-12-31 DIAGNOSIS — N186 End stage renal disease: Secondary | ICD-10-CM | POA: Diagnosis not present

## 2014-12-31 DIAGNOSIS — D509 Iron deficiency anemia, unspecified: Secondary | ICD-10-CM | POA: Diagnosis not present

## 2014-12-31 DIAGNOSIS — D631 Anemia in chronic kidney disease: Secondary | ICD-10-CM | POA: Diagnosis not present

## 2014-12-31 DIAGNOSIS — N184 Chronic kidney disease, stage 4 (severe): Secondary | ICD-10-CM | POA: Diagnosis not present

## 2014-12-31 DIAGNOSIS — N2581 Secondary hyperparathyroidism of renal origin: Secondary | ICD-10-CM | POA: Diagnosis not present

## 2014-12-31 NOTE — Telephone Encounter (Signed)
New problem ° ° °Pt returning your call. °

## 2015-01-01 DIAGNOSIS — R278 Other lack of coordination: Secondary | ICD-10-CM | POA: Diagnosis not present

## 2015-01-01 DIAGNOSIS — M6281 Muscle weakness (generalized): Secondary | ICD-10-CM | POA: Diagnosis not present

## 2015-01-01 DIAGNOSIS — R2689 Other abnormalities of gait and mobility: Secondary | ICD-10-CM | POA: Diagnosis not present

## 2015-01-01 DIAGNOSIS — Z48812 Encounter for surgical aftercare following surgery on the circulatory system: Secondary | ICD-10-CM | POA: Diagnosis not present

## 2015-01-02 DIAGNOSIS — N2581 Secondary hyperparathyroidism of renal origin: Secondary | ICD-10-CM | POA: Diagnosis not present

## 2015-01-02 DIAGNOSIS — N186 End stage renal disease: Secondary | ICD-10-CM | POA: Diagnosis not present

## 2015-01-02 DIAGNOSIS — D509 Iron deficiency anemia, unspecified: Secondary | ICD-10-CM | POA: Diagnosis not present

## 2015-01-02 DIAGNOSIS — D631 Anemia in chronic kidney disease: Secondary | ICD-10-CM | POA: Diagnosis not present

## 2015-01-02 DIAGNOSIS — N184 Chronic kidney disease, stage 4 (severe): Secondary | ICD-10-CM | POA: Diagnosis not present

## 2015-01-03 ENCOUNTER — Encounter: Payer: Self-pay | Admitting: *Deleted

## 2015-01-03 ENCOUNTER — Ambulatory Visit (INDEPENDENT_AMBULATORY_CARE_PROVIDER_SITE_OTHER): Payer: Medicare Other | Admitting: *Deleted

## 2015-01-03 VITALS — BP 132/58 | HR 89 | Temp 98.1°F | Ht 68.5 in | Wt 170.0 lb

## 2015-01-03 DIAGNOSIS — I25709 Atherosclerosis of coronary artery bypass graft(s), unspecified, with unspecified angina pectoris: Secondary | ICD-10-CM | POA: Diagnosis not present

## 2015-01-03 NOTE — Progress Notes (Signed)
Pt in for CABG X3 f/u ECG= NSR pos L atrial Enlargement, Right axis deviation, Left bundle branch block, Abnormal ECG. Reviewed by DOD.  Patient said he was doing well, just anxious to get his "strength back"   No CP, SOB, nausea, dizziness.

## 2015-01-04 DIAGNOSIS — D631 Anemia in chronic kidney disease: Secondary | ICD-10-CM | POA: Diagnosis not present

## 2015-01-04 DIAGNOSIS — N186 End stage renal disease: Secondary | ICD-10-CM | POA: Diagnosis not present

## 2015-01-04 DIAGNOSIS — D509 Iron deficiency anemia, unspecified: Secondary | ICD-10-CM | POA: Diagnosis not present

## 2015-01-04 DIAGNOSIS — N2889 Other specified disorders of kidney and ureter: Secondary | ICD-10-CM | POA: Diagnosis not present

## 2015-01-04 DIAGNOSIS — N184 Chronic kidney disease, stage 4 (severe): Secondary | ICD-10-CM | POA: Diagnosis not present

## 2015-01-04 DIAGNOSIS — Z992 Dependence on renal dialysis: Secondary | ICD-10-CM | POA: Diagnosis not present

## 2015-01-04 DIAGNOSIS — N2581 Secondary hyperparathyroidism of renal origin: Secondary | ICD-10-CM | POA: Diagnosis not present

## 2015-01-07 DIAGNOSIS — D509 Iron deficiency anemia, unspecified: Secondary | ICD-10-CM | POA: Diagnosis not present

## 2015-01-07 DIAGNOSIS — N184 Chronic kidney disease, stage 4 (severe): Secondary | ICD-10-CM | POA: Diagnosis not present

## 2015-01-07 DIAGNOSIS — D631 Anemia in chronic kidney disease: Secondary | ICD-10-CM | POA: Diagnosis not present

## 2015-01-07 DIAGNOSIS — N186 End stage renal disease: Secondary | ICD-10-CM | POA: Diagnosis not present

## 2015-01-07 DIAGNOSIS — N2581 Secondary hyperparathyroidism of renal origin: Secondary | ICD-10-CM | POA: Diagnosis not present

## 2015-01-08 DIAGNOSIS — R2689 Other abnormalities of gait and mobility: Secondary | ICD-10-CM | POA: Diagnosis not present

## 2015-01-08 DIAGNOSIS — R278 Other lack of coordination: Secondary | ICD-10-CM | POA: Diagnosis not present

## 2015-01-08 DIAGNOSIS — Z48812 Encounter for surgical aftercare following surgery on the circulatory system: Secondary | ICD-10-CM | POA: Diagnosis not present

## 2015-01-08 DIAGNOSIS — M6281 Muscle weakness (generalized): Secondary | ICD-10-CM | POA: Diagnosis not present

## 2015-01-09 DIAGNOSIS — N186 End stage renal disease: Secondary | ICD-10-CM | POA: Diagnosis not present

## 2015-01-09 DIAGNOSIS — D631 Anemia in chronic kidney disease: Secondary | ICD-10-CM | POA: Diagnosis not present

## 2015-01-09 DIAGNOSIS — N2581 Secondary hyperparathyroidism of renal origin: Secondary | ICD-10-CM | POA: Diagnosis not present

## 2015-01-09 DIAGNOSIS — D509 Iron deficiency anemia, unspecified: Secondary | ICD-10-CM | POA: Diagnosis not present

## 2015-01-09 DIAGNOSIS — N184 Chronic kidney disease, stage 4 (severe): Secondary | ICD-10-CM | POA: Diagnosis not present

## 2015-01-10 ENCOUNTER — Ambulatory Visit (HOSPITAL_COMMUNITY): Payer: Medicare Other | Attending: Cardiovascular Disease

## 2015-01-10 ENCOUNTER — Other Ambulatory Visit: Payer: Self-pay

## 2015-01-10 DIAGNOSIS — Z992 Dependence on renal dialysis: Secondary | ICD-10-CM | POA: Diagnosis not present

## 2015-01-10 DIAGNOSIS — R011 Cardiac murmur, unspecified: Secondary | ICD-10-CM | POA: Diagnosis not present

## 2015-01-10 DIAGNOSIS — I340 Nonrheumatic mitral (valve) insufficiency: Secondary | ICD-10-CM | POA: Diagnosis not present

## 2015-01-10 DIAGNOSIS — Z954 Presence of other heart-valve replacement: Secondary | ICD-10-CM | POA: Diagnosis not present

## 2015-01-10 DIAGNOSIS — I517 Cardiomegaly: Secondary | ICD-10-CM | POA: Diagnosis not present

## 2015-01-10 DIAGNOSIS — Z951 Presence of aortocoronary bypass graft: Secondary | ICD-10-CM | POA: Diagnosis not present

## 2015-01-10 DIAGNOSIS — I351 Nonrheumatic aortic (valve) insufficiency: Secondary | ICD-10-CM | POA: Diagnosis not present

## 2015-01-10 DIAGNOSIS — I120 Hypertensive chronic kidney disease with stage 5 chronic kidney disease or end stage renal disease: Secondary | ICD-10-CM | POA: Diagnosis not present

## 2015-01-10 DIAGNOSIS — N186 End stage renal disease: Secondary | ICD-10-CM | POA: Insufficient documentation

## 2015-01-10 DIAGNOSIS — I359 Nonrheumatic aortic valve disorder, unspecified: Secondary | ICD-10-CM | POA: Diagnosis present

## 2015-01-10 DIAGNOSIS — Z952 Presence of prosthetic heart valve: Secondary | ICD-10-CM

## 2015-01-11 DIAGNOSIS — D631 Anemia in chronic kidney disease: Secondary | ICD-10-CM | POA: Diagnosis not present

## 2015-01-11 DIAGNOSIS — D509 Iron deficiency anemia, unspecified: Secondary | ICD-10-CM | POA: Diagnosis not present

## 2015-01-11 DIAGNOSIS — N2581 Secondary hyperparathyroidism of renal origin: Secondary | ICD-10-CM | POA: Diagnosis not present

## 2015-01-11 DIAGNOSIS — N184 Chronic kidney disease, stage 4 (severe): Secondary | ICD-10-CM | POA: Diagnosis not present

## 2015-01-11 DIAGNOSIS — N186 End stage renal disease: Secondary | ICD-10-CM | POA: Diagnosis not present

## 2015-01-13 ENCOUNTER — Other Ambulatory Visit: Payer: Self-pay | Admitting: Surgery

## 2015-01-13 DIAGNOSIS — Z952 Presence of prosthetic heart valve: Secondary | ICD-10-CM

## 2015-01-14 DIAGNOSIS — N184 Chronic kidney disease, stage 4 (severe): Secondary | ICD-10-CM | POA: Diagnosis not present

## 2015-01-14 DIAGNOSIS — D509 Iron deficiency anemia, unspecified: Secondary | ICD-10-CM | POA: Diagnosis not present

## 2015-01-14 DIAGNOSIS — N186 End stage renal disease: Secondary | ICD-10-CM | POA: Diagnosis not present

## 2015-01-14 DIAGNOSIS — N2581 Secondary hyperparathyroidism of renal origin: Secondary | ICD-10-CM | POA: Diagnosis not present

## 2015-01-14 DIAGNOSIS — D631 Anemia in chronic kidney disease: Secondary | ICD-10-CM | POA: Diagnosis not present

## 2015-01-15 ENCOUNTER — Ambulatory Visit (INDEPENDENT_AMBULATORY_CARE_PROVIDER_SITE_OTHER): Payer: Self-pay | Admitting: Surgery

## 2015-01-15 ENCOUNTER — Ambulatory Visit: Payer: Self-pay | Admitting: Nurse Practitioner

## 2015-01-15 ENCOUNTER — Encounter: Payer: Self-pay | Admitting: Surgery

## 2015-01-15 ENCOUNTER — Ambulatory Visit
Admission: RE | Admit: 2015-01-15 | Discharge: 2015-01-15 | Disposition: A | Discharge status: Home / Self Care | Payer: Medicare Other | Source: Ambulatory Visit | Attending: Surgery | Admitting: Surgery

## 2015-01-15 VITALS — BP 123/71 | HR 85 | Resp 20 | Ht 68.5 in | Wt 170.0 lb

## 2015-01-15 DIAGNOSIS — Z48812 Encounter for surgical aftercare following surgery on the circulatory system: Secondary | ICD-10-CM | POA: Diagnosis not present

## 2015-01-15 DIAGNOSIS — Z952 Presence of prosthetic heart valve: Secondary | ICD-10-CM

## 2015-01-15 DIAGNOSIS — M6281 Muscle weakness (generalized): Secondary | ICD-10-CM | POA: Diagnosis not present

## 2015-01-15 DIAGNOSIS — Z954 Presence of other heart-valve replacement: Secondary | ICD-10-CM

## 2015-01-15 DIAGNOSIS — I25119 Atherosclerotic heart disease of native coronary artery with unspecified angina pectoris: Secondary | ICD-10-CM

## 2015-01-15 DIAGNOSIS — I350 Nonrheumatic aortic (valve) stenosis: Secondary | ICD-10-CM

## 2015-01-15 DIAGNOSIS — R278 Other lack of coordination: Secondary | ICD-10-CM | POA: Diagnosis not present

## 2015-01-15 DIAGNOSIS — Z951 Presence of aortocoronary bypass graft: Secondary | ICD-10-CM

## 2015-01-15 DIAGNOSIS — J90 Pleural effusion, not elsewhere classified: Secondary | ICD-10-CM | POA: Diagnosis not present

## 2015-01-15 DIAGNOSIS — R2689 Other abnormalities of gait and mobility: Secondary | ICD-10-CM | POA: Diagnosis not present

## 2015-01-15 NOTE — Progress Notes (Signed)
      HPI:  Patient returns for routine postoperative follow-up having undergone CABG x 3 and AVR using a 23 mm pericardial valve on 12/09/2014. The patient's early postoperative recovery while in the hospital was notable for an uncomplicated postop course. Since hospital discharge the patient reports that he felt tired for a while which he attributes to dialysis but now his stamina is improving and he is walking about 5000 steps per day by his Fitbit. He denies shortness of breath or chest pain. His only complaint is of not sleeping very well.   Current Outpatient Prescriptions  Medication Sig Dispense Refill  . allopurinol (ZYLOPRIM) 100 MG tablet Take 1 tablet (100 mg total) by mouth daily. 90 tablet 3  . amLODipine (NORVASC) 10 MG tablet Take one tablet by mouth once daily to control blood pressure 90 tablet 3  . aspirin EC 81 MG tablet Take 81 mg by mouth at bedtime.     Marland Kitchen atorvastatin (LIPITOR) 20 MG tablet Take one tablet by mouth once daily for cholesterol 90 tablet 1  . calcium carbonate (TUMS - DOSED IN MG ELEMENTAL CALCIUM) 500 MG chewable tablet Chew 2 tablets by mouth 3 (three) times daily with meals.    . metoprolol tartrate (LOPRESSOR) 25 MG tablet Take 0.5 tablets (12.5 mg total) by mouth 2 (two) times daily. 31 tablet 1  . multivitamin (RENA-VIT) TABS tablet Take 1 tablet by mouth daily.     Marland Kitchen omeprazole (PRILOSEC) 20 MG capsule Take 20 mg by mouth 4 (four) times a week. Sunday, Tuesday, Thursday and Saturday    . Vitamin D, Ergocalciferol, (DRISDOL) 50000 UNITS CAPS capsule Take 1 capsule (50,000 Units total) by mouth every 14 (fourteen) days. 1st and 15th of each month 24 capsule 0   No current facility-administered medications for this visit.    Physical Exam: BP 123/71 mmHg  Pulse 85  Resp 20  Ht 5' 8.5" (1.74 m)  Wt 170 lb (77.111 kg)  BMI 25.47 kg/m2  SpO2 98% He looks well Lungs are clear Cardiac exam shows a regular rate and rhythm with normal heart  sounds. The chest incision is healing well and the sternum is stable. The leg incisions are healing well and there is no significant edema.   Diagnostic Tests:  CLINICAL DATA: History of CABG. Never smoked. Status post AVR.  EXAM: CHEST 2 VIEW  COMPARISON: None.  FINDINGS: The lungs are hyperinflated likely secondary to COPD. Trace right pleural effusion. There is no focal consolidation. There is no pneumothorax. There is hazy left lower lobe airspace disease likely reflecting atelectasis. The heart and mediastinal contours are unremarkable. There is evidence of prior median sternotomy with aortic valve replacement.  The osseous structures are unremarkable.  IMPRESSION: Trace right pleural effusion. Status post AVR replacement.   Electronically Signed  By: Kathreen Devoid  On: 01/15/2015 13:30   Impression:  Overall I think he is doing well. I encouraged him to continue walking. He is not planning to participate in cardiac rehab. I told him he could drive his car but should not lift anything heavier than 10 lbs for three months postop.   Plan:  He will continue follow up with Truitt Merle and Hollace Kinnier. He does not need to return to see me unless he develops a problem with his incisions.   Gaye Pollack, MD [facility] [phone]

## 2015-01-16 DIAGNOSIS — N186 End stage renal disease: Secondary | ICD-10-CM | POA: Diagnosis not present

## 2015-01-16 DIAGNOSIS — D509 Iron deficiency anemia, unspecified: Secondary | ICD-10-CM | POA: Diagnosis not present

## 2015-01-16 DIAGNOSIS — N2581 Secondary hyperparathyroidism of renal origin: Secondary | ICD-10-CM | POA: Diagnosis not present

## 2015-01-16 DIAGNOSIS — N184 Chronic kidney disease, stage 4 (severe): Secondary | ICD-10-CM | POA: Diagnosis not present

## 2015-01-16 DIAGNOSIS — D631 Anemia in chronic kidney disease: Secondary | ICD-10-CM | POA: Diagnosis not present

## 2015-01-18 DIAGNOSIS — N2581 Secondary hyperparathyroidism of renal origin: Secondary | ICD-10-CM | POA: Diagnosis not present

## 2015-01-18 DIAGNOSIS — N184 Chronic kidney disease, stage 4 (severe): Secondary | ICD-10-CM | POA: Diagnosis not present

## 2015-01-18 DIAGNOSIS — D631 Anemia in chronic kidney disease: Secondary | ICD-10-CM | POA: Diagnosis not present

## 2015-01-18 DIAGNOSIS — D509 Iron deficiency anemia, unspecified: Secondary | ICD-10-CM | POA: Diagnosis not present

## 2015-01-18 DIAGNOSIS — N186 End stage renal disease: Secondary | ICD-10-CM | POA: Diagnosis not present

## 2015-01-21 DIAGNOSIS — D631 Anemia in chronic kidney disease: Secondary | ICD-10-CM | POA: Diagnosis not present

## 2015-01-21 DIAGNOSIS — D509 Iron deficiency anemia, unspecified: Secondary | ICD-10-CM | POA: Diagnosis not present

## 2015-01-21 DIAGNOSIS — N184 Chronic kidney disease, stage 4 (severe): Secondary | ICD-10-CM | POA: Diagnosis not present

## 2015-01-21 DIAGNOSIS — N2581 Secondary hyperparathyroidism of renal origin: Secondary | ICD-10-CM | POA: Diagnosis not present

## 2015-01-21 DIAGNOSIS — N186 End stage renal disease: Secondary | ICD-10-CM | POA: Diagnosis not present

## 2015-01-22 DIAGNOSIS — Z48812 Encounter for surgical aftercare following surgery on the circulatory system: Secondary | ICD-10-CM | POA: Diagnosis not present

## 2015-01-22 DIAGNOSIS — R2689 Other abnormalities of gait and mobility: Secondary | ICD-10-CM | POA: Diagnosis not present

## 2015-01-22 DIAGNOSIS — R278 Other lack of coordination: Secondary | ICD-10-CM | POA: Diagnosis not present

## 2015-01-22 DIAGNOSIS — M6281 Muscle weakness (generalized): Secondary | ICD-10-CM | POA: Diagnosis not present

## 2015-01-23 DIAGNOSIS — D631 Anemia in chronic kidney disease: Secondary | ICD-10-CM | POA: Diagnosis not present

## 2015-01-23 DIAGNOSIS — N186 End stage renal disease: Secondary | ICD-10-CM | POA: Diagnosis not present

## 2015-01-23 DIAGNOSIS — D509 Iron deficiency anemia, unspecified: Secondary | ICD-10-CM | POA: Diagnosis not present

## 2015-01-23 DIAGNOSIS — N184 Chronic kidney disease, stage 4 (severe): Secondary | ICD-10-CM | POA: Diagnosis not present

## 2015-01-23 DIAGNOSIS — N2581 Secondary hyperparathyroidism of renal origin: Secondary | ICD-10-CM | POA: Diagnosis not present

## 2015-01-24 ENCOUNTER — Encounter: Payer: Self-pay | Admitting: Nurse Practitioner

## 2015-01-24 ENCOUNTER — Ambulatory Visit (INDEPENDENT_AMBULATORY_CARE_PROVIDER_SITE_OTHER): Payer: Medicare Other | Admitting: Nurse Practitioner

## 2015-01-24 VITALS — BP 128/66 | HR 80 | Ht 68.5 in | Wt 167.0 lb

## 2015-01-24 DIAGNOSIS — I25119 Atherosclerotic heart disease of native coronary artery with unspecified angina pectoris: Secondary | ICD-10-CM

## 2015-01-24 DIAGNOSIS — I10 Essential (primary) hypertension: Secondary | ICD-10-CM

## 2015-01-24 DIAGNOSIS — Z992 Dependence on renal dialysis: Secondary | ICD-10-CM

## 2015-01-24 DIAGNOSIS — Z954 Presence of other heart-valve replacement: Secondary | ICD-10-CM | POA: Diagnosis not present

## 2015-01-24 DIAGNOSIS — Z951 Presence of aortocoronary bypass graft: Secondary | ICD-10-CM | POA: Diagnosis not present

## 2015-01-24 DIAGNOSIS — Z952 Presence of prosthetic heart valve: Secondary | ICD-10-CM

## 2015-01-24 DIAGNOSIS — N186 End stage renal disease: Secondary | ICD-10-CM | POA: Diagnosis not present

## 2015-01-24 NOTE — Progress Notes (Signed)
CARDIOLOGY OFFICE NOTE  Date:  01/24/2015    Eric [name] Date of Birth: 08-Apr-1930 Medical Record [id number]  PCP:  Hollace Kinnier, DO  Cardiologist:  Caryl Comes    Chief Complaint  Patient presents with  . Coronary Artery Disease    1 month check - seen for Dr. Caryl Comes    History of Present Illness: Eric [name] is a 82 y.o. male who presents today for a one month check. Seen for Dr. Caryl Comes.   He has a history of ESRD on HD via AV fistula, insomnia, HTN, and GERD. Reported stress test at [facility] in [place], [place] 2 summers ago - noted in the record that he "has a problem with the one chamber of his heart not being attached properly and he knows about it since childhood". No actual documentation of this. Reports history of cardiac arrest approximately 2 years ago while having fistula procedure - sounds like he had a stress test at that time.   I saw him in the FLEX back in November as a new patient for DOE - had abnormal EKG and murmur on exam. Ended up getting echo, CT (negative for PE) and cath.   Cardiac cath on 11/27/2014 showed severe 3-vessel coronary disease. The proximal and mid LAD were heavily calcified with 80% proximal and 99% mid LAD stenosis followed by a 90% mid stenosis. The LCX had a 60% stenosis in a large OM2 and an occluded OM3 that long but small in diameter or underfilled by the collat from the LAD. The RCA is occluded distally with faint filling of a small diameter PDA branch by collat from the LAD. His echo showed at least moderate AS with a mean gradient of 36 mm Hg and a DI of 0.21. LVEF was reduced to 35% with anterior, septal and inferior wall motion abnormalities.   He underwent CABG x 3 with LIMA to LAD, SVG to OM2 and SVG to PDA with AVR with #23 Edwards Magna-Ease pericardial valve by Dr. Cyndia Bent on 12/09/2014. Nephrology assisted with his dialysis. He did initially require some inotropic support but this was able to be weaned over time without  difficulty. He had no significant postoperative cardiac dysrhythmias. He is on chronic Aranesp managed by the nephrologist.   Seen a month ago for post op visit - was doing ok. Cardiac status stable. Remained anemic.   Comes back today. Here with his wife. Continues "to get stronger and stronger". He notes more energy. Feels less fatigue now on his dialysis days. He is pleased with how he is doing. Wants to get back into the gym - understands his lifting restrictions. Not lightheaded or dizzy. No chest pain. Sternum still just a little sore.   Past Medical History  Diagnosis Date  . CKD (chronic kidney disease) stage 5, GFR less than 15 ml/min (HCC)   . Unspecified essential hypertension   . GERD (gastroesophageal reflux disease)   . Gout   . Herpes zoster 04/2012  . Heart murmur     as a teen  . Anxiety   . Arthritis   . Complication of anesthesia     " one time my heart stopped due to a medication that I was on."   . Shortness of breath dyspnea     with exertion  . Hemodialysis patient [facility])     Tues, Thursday, Saturday    Past Surgical History  Procedure Laterality Date  . Appendectomy  1944  . Cataract extraction w/  intraocular lens implant Bilateral 2014    [facility]  . Dialysis fistula creation  08/04/2012 and 10/13    Dr. Harden [place]  . Colonoscopy  2013    Dr. Annamaria Helling [place], [place].  Marland Kitchen Av fistula placement Left     Left Cimino AVF placed in [place]   . Insertion of dialysis catheter Right 01-15-14    Right chest TDC placed by Dr. Augustin Coupe at [facility]  . Eye surgery      Cataract  . Av fistula placement Right 03/06/2014    Procedure: ARTERIOVENOUS (AV) FISTULA CREATION-right radiocephalic;  Surgeon: Mal Misty, MD;  Location: [facility];  Service: Vascular;  Laterality: Right;  . Ligation of competing branches of arteriovenous fistula Right 05/08/2014    Procedure: LIGATION OF COMPETING BRANCHES OF RIGHT ARM RADIOCEPHALIC ARTERIOVENOUS FISTULA;  Surgeon:  Mal Misty, MD;  Location: [facility];  Service: Vascular;  Laterality: Right;  . Av fistula placement Right 07/15/2014    Procedure: ARTERIOVENOUS (AV) FISTULA CREATION;  Surgeon: Elam Dutch, MD;  Location: [facility] OR;  Service: Vascular;  Laterality: Right;  . Ligation of arteriovenous  fistula Right 07/15/2014    Procedure: LIGATION OF ARTERIOVENOUS  FISTULA  (RIGHT RADIOCEPHALIC);  Surgeon: Elam Dutch, MD;  Location: [facility];  Service: Vascular;  Laterality: Right;  . Revison of arteriovenous fistula Right 07/15/2014    Procedure: EXPLORATION OF ARTERIOVENOUS FISTULA;  Surgeon: Elam Dutch, MD;  Location: [facility];  Service: Vascular;  Laterality: Right;  . Cardiac catheterization N/A 11/27/2014    Procedure: Right/Left Heart Cath and Coronary Angiography;  Surgeon: Burnell Blanks, MD;  Location: [place] [facility];  Service: Cardiovascular;  Laterality: N/A;  . Aortic valve replacement N/A 12/09/2014    Procedure: AORTIC VALVE REPLACEMENT (AVR) WITH 23MM MAGNA EASE BIOPROSTHETIC VALVE;  Surgeon: Gaye Pollack, MD;  Location: [facility] OR;  Service: Open Heart Surgery;  Laterality: N/A;  . Coronary artery bypass graft N/A 12/09/2014    Procedure: CORONARY ARTERY BYPASS GRAFTING (CABG), ON PUMP, TIMES THREE, USING LEFT INTERNAL MAMMARY ARTERY, RIGHT GREATER SAPHENOUS VEIN HARVESTED ENDOSCOPICALLY;  Surgeon: Gaye Pollack, MD;  Location: [facility] OR;  Service: Open Heart Surgery;  Laterality: N/A;  LIMA-LAD; SVG-OM; SVG-PD  . Tee without cardioversion N/A 12/09/2014    Procedure: TRANSESOPHAGEAL ECHOCARDIOGRAM (TEE);  Surgeon: Gaye Pollack, MD;  Location: [facility];  Service: Open Heart Surgery;  Laterality: N/A;     Medications: Current Outpatient Prescriptions  Medication Sig Dispense Refill  . allopurinol (ZYLOPRIM) 100 MG tablet Take 1 tablet (100 mg total) by mouth daily. 90 tablet 3  . amLODipine (NORVASC) 10 MG tablet Take one tablet by mouth once daily to control blood pressure 90 tablet 3   . aspirin EC 81 MG tablet Take 81 mg by mouth at bedtime.     Marland Kitchen atorvastatin (LIPITOR) 20 MG tablet Take one tablet by mouth once daily for cholesterol 90 tablet 1  . calcium carbonate (TUMS - DOSED IN MG ELEMENTAL CALCIUM) 500 MG chewable tablet Chew 2 tablets by mouth 3 (three) times daily with meals.    . Chlorphen-Pseudoephed-APAP (CORICIDIN D PO) Take 2 tablets by mouth daily.    . metoprolol tartrate (LOPRESSOR) 25 MG tablet Take 0.5 tablets (12.5 mg total) by mouth 2 (two) times daily. 31 tablet 1  . multivitamin (RENA-VIT) TABS tablet Take 1 tablet by mouth daily.     Marland Kitchen omeprazole (PRILOSEC) 20 MG capsule Take 20  mg by mouth 4 (four) times a week. Sunday, Tuesday, Thursday and Saturday    . Vitamin D, Ergocalciferol, (DRISDOL) 50000 UNITS CAPS capsule Take 1 capsule (50,000 Units total) by mouth every 14 (fourteen) days. 1st and 15th of each month 24 capsule 0   No current facility-administered medications for this visit.    Allergies: No Known Allergies  Social History: The patient  reports that he has never smoked. He has never used smokeless tobacco. He reports that he drinks about 1.2 oz of alcohol per week. He reports that he does not use illicit drugs.   Family History: The patient's family history includes Cancer in his father and mother.   Review of Systems: Please see the history of present illness.   Otherwise, the review of systems is positive for none.   All other systems are reviewed and negative.   Physical Exam: VS:  BP 128/66 mmHg  Pulse 80  Ht 5' 8.5" (1.74 m)  Wt 167 lb (75.751 kg)  BMI 25.02 kg/m2 .  BMI Body mass index is 25.02 kg/(m^2).  Wt Readings from Last 3 Encounters:  01/24/15 167 lb (75.751 kg)  01/15/15 170 lb (77.111 kg)  01/03/15 170 lb (77.111 kg)    General: Pleasant. Looks stronger. He is alert and in no acute distress.  HEENT: Normal. Neck: Supple, no JVD, carotid bruits, or masses noted.  Cardiac: Regular rate and rhythm. He has an  outflow murmur noted.  No edema.  Respiratory:  Lungs are clear to auscultation bilaterally with normal work of breathing.  GI: Soft and nontender.  MS: No deformity or atrophy. Gait and ROM intact. Skin: Warm and dry. Color is normal.  Neuro:  Strength and sensation are intact and no gross focal deficits noted.  Psych: Alert, appropriate and with normal affect.   LABORATORY DATA:  EKG:  EKG is not ordered today.  Lab Results  Component Value Date   WBC 7.1 12/27/2014   HGB 7.6* 12/27/2014   HCT 23.3* 12/27/2014   PLT 340 12/27/2014   GLUCOSE 140* 12/27/2014   CHOL 117 02/07/2013   TRIG 129 02/07/2013   HDL 36 02/07/2013   LDLCALC 55 02/07/2013   ALT 13* 12/06/2014   AST 13* 12/06/2014   NA 139 12/27/2014   K 4.5 12/27/2014   CL 100 12/27/2014   CREATININE 4.92* 12/27/2014   BUN 33* 12/27/2014   CO2 30 12/27/2014   INR 1.42 12/09/2014   HGBA1C 5.2 12/06/2014    BNP (last 3 results) No results for input(s): BNP in the last 8760 hours.  ProBNP (last 3 results) No results for input(s): PROBNP in the last 8760 hours.   Other Studies Reviewed Today:  Echo Study Conclusions from 01/2015  - Left ventricle: The cavity size was normal. Wall thickness was increased in a pattern of mild LVH. Systolic function was mildly to moderately reduced. The estimated ejection fraction was in the range of 40% to 45%. Hypokinesis of the mid-apicalinferior and inferoseptal myocardium. Features are consistent with a pseudonormal left ventricular filling pattern, with concomitant abnormal relaxation and increased filling pressure (grade 2 diastolic dysfunction). - Ventricular septum: Septal motion showed paradox. These changes are consistent with a post-thoracotomy state. - Aortic valve: A bioprosthesis was present and functioning normally. There was mild regurgitation directed eccentrically in the LVOT and along the septum. There was no significant perivalvular  regurgitation. - Mitral valve: There was mild to moderate regurgitation directed centrally. - Left atrium: The atrium was moderately dilated. -  Right ventricle: Systolic function was mildly reduced. - Right atrium: The atrium was mildly dilated.  Assessment/Plan: 1. DOE - found to have AS as well as significant CAD - now s/p CABG and AVR - about 6 weeks out. He is doing well. Echo showing good function. Does have some mild LV dysfunction - will need to follow - but this is improved from pre op study.    2. ESRD - on dialysis  3. Chronic Anemia - remains on Aranesp - managed by Renal.   4. HLD - on statin therapy.  Current medicines are reviewed with the patient today.  The patient does not have concerns regarding medicines other than what has been noted above.  The following changes have been made:  See above.  Labs/ tests ordered today include:   No orders of the defined types were placed in this encounter.     Disposition:   FU with me in 4 months with fasting labs.    Patient is agreeable to this plan and will call if any problems develop in the interim.   Signed: Burtis Junes, RN, ANP-C 01/24/2015 10:47 AM  [facility] [address] [place],   [address] Phone: [phone] Fax: [phone]

## 2015-01-24 NOTE — Patient Instructions (Addendum)
We will be checking the following labs today - NONE   Medication Instructions:    Continue with your current medicines.     Testing/Procedures To Be Arranged:  N/A  Follow-Up:   See me in 4 months with fasting labs    Other Special Instructions:   N/A    If you need a refill on your cardiac medications before your next appointment, please call your pharmacy.   Call the [facility] office at [phone] if you have any questions, problems or concerns.      

## 2015-01-25 DIAGNOSIS — N184 Chronic kidney disease, stage 4 (severe): Secondary | ICD-10-CM | POA: Diagnosis not present

## 2015-01-25 DIAGNOSIS — N2581 Secondary hyperparathyroidism of renal origin: Secondary | ICD-10-CM | POA: Diagnosis not present

## 2015-01-25 DIAGNOSIS — N186 End stage renal disease: Secondary | ICD-10-CM | POA: Diagnosis not present

## 2015-01-25 DIAGNOSIS — D509 Iron deficiency anemia, unspecified: Secondary | ICD-10-CM | POA: Diagnosis not present

## 2015-01-25 DIAGNOSIS — D631 Anemia in chronic kidney disease: Secondary | ICD-10-CM | POA: Diagnosis not present

## 2015-01-28 DIAGNOSIS — D509 Iron deficiency anemia, unspecified: Secondary | ICD-10-CM | POA: Diagnosis not present

## 2015-01-28 DIAGNOSIS — N184 Chronic kidney disease, stage 4 (severe): Secondary | ICD-10-CM | POA: Diagnosis not present

## 2015-01-28 DIAGNOSIS — N2581 Secondary hyperparathyroidism of renal origin: Secondary | ICD-10-CM | POA: Diagnosis not present

## 2015-01-28 DIAGNOSIS — D631 Anemia in chronic kidney disease: Secondary | ICD-10-CM | POA: Diagnosis not present

## 2015-01-28 DIAGNOSIS — N186 End stage renal disease: Secondary | ICD-10-CM | POA: Diagnosis not present

## 2015-01-29 DIAGNOSIS — R2689 Other abnormalities of gait and mobility: Secondary | ICD-10-CM | POA: Diagnosis not present

## 2015-01-29 DIAGNOSIS — Z48812 Encounter for surgical aftercare following surgery on the circulatory system: Secondary | ICD-10-CM | POA: Diagnosis not present

## 2015-01-29 DIAGNOSIS — R278 Other lack of coordination: Secondary | ICD-10-CM | POA: Diagnosis not present

## 2015-01-29 DIAGNOSIS — M6281 Muscle weakness (generalized): Secondary | ICD-10-CM | POA: Diagnosis not present

## 2015-01-30 DIAGNOSIS — D509 Iron deficiency anemia, unspecified: Secondary | ICD-10-CM | POA: Diagnosis not present

## 2015-01-30 DIAGNOSIS — D631 Anemia in chronic kidney disease: Secondary | ICD-10-CM | POA: Diagnosis not present

## 2015-01-30 DIAGNOSIS — N186 End stage renal disease: Secondary | ICD-10-CM | POA: Diagnosis not present

## 2015-01-30 DIAGNOSIS — N2581 Secondary hyperparathyroidism of renal origin: Secondary | ICD-10-CM | POA: Diagnosis not present

## 2015-01-30 DIAGNOSIS — N184 Chronic kidney disease, stage 4 (severe): Secondary | ICD-10-CM | POA: Diagnosis not present

## 2015-02-01 DIAGNOSIS — N186 End stage renal disease: Secondary | ICD-10-CM | POA: Diagnosis not present

## 2015-02-01 DIAGNOSIS — D631 Anemia in chronic kidney disease: Secondary | ICD-10-CM | POA: Diagnosis not present

## 2015-02-01 DIAGNOSIS — N2581 Secondary hyperparathyroidism of renal origin: Secondary | ICD-10-CM | POA: Diagnosis not present

## 2015-02-01 DIAGNOSIS — N184 Chronic kidney disease, stage 4 (severe): Secondary | ICD-10-CM | POA: Diagnosis not present

## 2015-02-01 DIAGNOSIS — D509 Iron deficiency anemia, unspecified: Secondary | ICD-10-CM | POA: Diagnosis not present

## 2015-02-04 DIAGNOSIS — Z992 Dependence on renal dialysis: Secondary | ICD-10-CM | POA: Diagnosis not present

## 2015-02-04 DIAGNOSIS — N184 Chronic kidney disease, stage 4 (severe): Secondary | ICD-10-CM | POA: Diagnosis not present

## 2015-02-04 DIAGNOSIS — N2889 Other specified disorders of kidney and ureter: Secondary | ICD-10-CM | POA: Diagnosis not present

## 2015-02-04 DIAGNOSIS — D631 Anemia in chronic kidney disease: Secondary | ICD-10-CM | POA: Diagnosis not present

## 2015-02-04 DIAGNOSIS — N186 End stage renal disease: Secondary | ICD-10-CM | POA: Diagnosis not present

## 2015-02-04 DIAGNOSIS — N2581 Secondary hyperparathyroidism of renal origin: Secondary | ICD-10-CM | POA: Diagnosis not present

## 2015-02-04 DIAGNOSIS — D509 Iron deficiency anemia, unspecified: Secondary | ICD-10-CM | POA: Diagnosis not present

## 2015-02-06 DIAGNOSIS — D631 Anemia in chronic kidney disease: Secondary | ICD-10-CM | POA: Diagnosis not present

## 2015-02-06 DIAGNOSIS — N184 Chronic kidney disease, stage 4 (severe): Secondary | ICD-10-CM | POA: Diagnosis not present

## 2015-02-06 DIAGNOSIS — D509 Iron deficiency anemia, unspecified: Secondary | ICD-10-CM | POA: Diagnosis not present

## 2015-02-06 DIAGNOSIS — N186 End stage renal disease: Secondary | ICD-10-CM | POA: Diagnosis not present

## 2015-02-06 DIAGNOSIS — N2581 Secondary hyperparathyroidism of renal origin: Secondary | ICD-10-CM | POA: Diagnosis not present

## 2015-02-08 DIAGNOSIS — N2581 Secondary hyperparathyroidism of renal origin: Secondary | ICD-10-CM | POA: Diagnosis not present

## 2015-02-08 DIAGNOSIS — D509 Iron deficiency anemia, unspecified: Secondary | ICD-10-CM | POA: Diagnosis not present

## 2015-02-08 DIAGNOSIS — D631 Anemia in chronic kidney disease: Secondary | ICD-10-CM | POA: Diagnosis not present

## 2015-02-08 DIAGNOSIS — N186 End stage renal disease: Secondary | ICD-10-CM | POA: Diagnosis not present

## 2015-02-08 DIAGNOSIS — N184 Chronic kidney disease, stage 4 (severe): Secondary | ICD-10-CM | POA: Diagnosis not present

## 2015-02-11 DIAGNOSIS — N2581 Secondary hyperparathyroidism of renal origin: Secondary | ICD-10-CM | POA: Diagnosis not present

## 2015-02-11 DIAGNOSIS — N184 Chronic kidney disease, stage 4 (severe): Secondary | ICD-10-CM | POA: Diagnosis not present

## 2015-02-11 DIAGNOSIS — D509 Iron deficiency anemia, unspecified: Secondary | ICD-10-CM | POA: Diagnosis not present

## 2015-02-11 DIAGNOSIS — N186 End stage renal disease: Secondary | ICD-10-CM | POA: Diagnosis not present

## 2015-02-11 DIAGNOSIS — D631 Anemia in chronic kidney disease: Secondary | ICD-10-CM | POA: Diagnosis not present

## 2015-02-13 DIAGNOSIS — N2581 Secondary hyperparathyroidism of renal origin: Secondary | ICD-10-CM | POA: Diagnosis not present

## 2015-02-13 DIAGNOSIS — N184 Chronic kidney disease, stage 4 (severe): Secondary | ICD-10-CM | POA: Diagnosis not present

## 2015-02-13 DIAGNOSIS — D631 Anemia in chronic kidney disease: Secondary | ICD-10-CM | POA: Diagnosis not present

## 2015-02-13 DIAGNOSIS — D509 Iron deficiency anemia, unspecified: Secondary | ICD-10-CM | POA: Diagnosis not present

## 2015-02-13 DIAGNOSIS — N186 End stage renal disease: Secondary | ICD-10-CM | POA: Diagnosis not present

## 2015-02-15 DIAGNOSIS — N2581 Secondary hyperparathyroidism of renal origin: Secondary | ICD-10-CM | POA: Diagnosis not present

## 2015-02-15 DIAGNOSIS — N186 End stage renal disease: Secondary | ICD-10-CM | POA: Diagnosis not present

## 2015-02-15 DIAGNOSIS — N184 Chronic kidney disease, stage 4 (severe): Secondary | ICD-10-CM | POA: Diagnosis not present

## 2015-02-15 DIAGNOSIS — D631 Anemia in chronic kidney disease: Secondary | ICD-10-CM | POA: Diagnosis not present

## 2015-02-15 DIAGNOSIS — D509 Iron deficiency anemia, unspecified: Secondary | ICD-10-CM | POA: Diagnosis not present

## 2015-02-17 ENCOUNTER — Other Ambulatory Visit: Payer: Self-pay | Admitting: Surgical

## 2015-02-17 DIAGNOSIS — L309 Dermatitis, unspecified: Secondary | ICD-10-CM | POA: Diagnosis not present

## 2015-02-18 ENCOUNTER — Other Ambulatory Visit: Payer: Self-pay | Admitting: Internal Medicine

## 2015-02-18 DIAGNOSIS — N184 Chronic kidney disease, stage 4 (severe): Secondary | ICD-10-CM | POA: Diagnosis not present

## 2015-02-18 DIAGNOSIS — N186 End stage renal disease: Secondary | ICD-10-CM | POA: Diagnosis not present

## 2015-02-18 DIAGNOSIS — N2581 Secondary hyperparathyroidism of renal origin: Secondary | ICD-10-CM | POA: Diagnosis not present

## 2015-02-18 DIAGNOSIS — D631 Anemia in chronic kidney disease: Secondary | ICD-10-CM | POA: Diagnosis not present

## 2015-02-18 DIAGNOSIS — D509 Iron deficiency anemia, unspecified: Secondary | ICD-10-CM | POA: Diagnosis not present

## 2015-02-20 DIAGNOSIS — D631 Anemia in chronic kidney disease: Secondary | ICD-10-CM | POA: Diagnosis not present

## 2015-02-20 DIAGNOSIS — N2581 Secondary hyperparathyroidism of renal origin: Secondary | ICD-10-CM | POA: Diagnosis not present

## 2015-02-20 DIAGNOSIS — N186 End stage renal disease: Secondary | ICD-10-CM | POA: Diagnosis not present

## 2015-02-20 DIAGNOSIS — N184 Chronic kidney disease, stage 4 (severe): Secondary | ICD-10-CM | POA: Diagnosis not present

## 2015-02-20 DIAGNOSIS — D509 Iron deficiency anemia, unspecified: Secondary | ICD-10-CM | POA: Diagnosis not present

## 2015-02-22 DIAGNOSIS — N184 Chronic kidney disease, stage 4 (severe): Secondary | ICD-10-CM | POA: Diagnosis not present

## 2015-02-22 DIAGNOSIS — D509 Iron deficiency anemia, unspecified: Secondary | ICD-10-CM | POA: Diagnosis not present

## 2015-02-22 DIAGNOSIS — N2581 Secondary hyperparathyroidism of renal origin: Secondary | ICD-10-CM | POA: Diagnosis not present

## 2015-02-22 DIAGNOSIS — N186 End stage renal disease: Secondary | ICD-10-CM | POA: Diagnosis not present

## 2015-02-22 DIAGNOSIS — D631 Anemia in chronic kidney disease: Secondary | ICD-10-CM | POA: Diagnosis not present

## 2015-02-25 DIAGNOSIS — D631 Anemia in chronic kidney disease: Secondary | ICD-10-CM | POA: Diagnosis not present

## 2015-02-25 DIAGNOSIS — N184 Chronic kidney disease, stage 4 (severe): Secondary | ICD-10-CM | POA: Diagnosis not present

## 2015-02-25 DIAGNOSIS — N2581 Secondary hyperparathyroidism of renal origin: Secondary | ICD-10-CM | POA: Diagnosis not present

## 2015-02-25 DIAGNOSIS — D509 Iron deficiency anemia, unspecified: Secondary | ICD-10-CM | POA: Diagnosis not present

## 2015-02-25 DIAGNOSIS — N186 End stage renal disease: Secondary | ICD-10-CM | POA: Diagnosis not present

## 2015-02-27 DIAGNOSIS — N186 End stage renal disease: Secondary | ICD-10-CM | POA: Diagnosis not present

## 2015-02-27 DIAGNOSIS — N2581 Secondary hyperparathyroidism of renal origin: Secondary | ICD-10-CM | POA: Diagnosis not present

## 2015-02-27 DIAGNOSIS — D631 Anemia in chronic kidney disease: Secondary | ICD-10-CM | POA: Diagnosis not present

## 2015-02-27 DIAGNOSIS — N184 Chronic kidney disease, stage 4 (severe): Secondary | ICD-10-CM | POA: Diagnosis not present

## 2015-02-27 DIAGNOSIS — D509 Iron deficiency anemia, unspecified: Secondary | ICD-10-CM | POA: Diagnosis not present

## 2015-03-01 DIAGNOSIS — D509 Iron deficiency anemia, unspecified: Secondary | ICD-10-CM | POA: Diagnosis not present

## 2015-03-01 DIAGNOSIS — D631 Anemia in chronic kidney disease: Secondary | ICD-10-CM | POA: Diagnosis not present

## 2015-03-01 DIAGNOSIS — N2581 Secondary hyperparathyroidism of renal origin: Secondary | ICD-10-CM | POA: Diagnosis not present

## 2015-03-01 DIAGNOSIS — N184 Chronic kidney disease, stage 4 (severe): Secondary | ICD-10-CM | POA: Diagnosis not present

## 2015-03-01 DIAGNOSIS — N186 End stage renal disease: Secondary | ICD-10-CM | POA: Diagnosis not present

## 2015-03-04 DIAGNOSIS — D509 Iron deficiency anemia, unspecified: Secondary | ICD-10-CM | POA: Diagnosis not present

## 2015-03-04 DIAGNOSIS — Z992 Dependence on renal dialysis: Secondary | ICD-10-CM | POA: Diagnosis not present

## 2015-03-04 DIAGNOSIS — N2581 Secondary hyperparathyroidism of renal origin: Secondary | ICD-10-CM | POA: Diagnosis not present

## 2015-03-04 DIAGNOSIS — D631 Anemia in chronic kidney disease: Secondary | ICD-10-CM | POA: Diagnosis not present

## 2015-03-04 DIAGNOSIS — N2889 Other specified disorders of kidney and ureter: Secondary | ICD-10-CM | POA: Diagnosis not present

## 2015-03-04 DIAGNOSIS — N186 End stage renal disease: Secondary | ICD-10-CM | POA: Diagnosis not present

## 2015-03-04 DIAGNOSIS — N184 Chronic kidney disease, stage 4 (severe): Secondary | ICD-10-CM | POA: Diagnosis not present

## 2015-03-06 DIAGNOSIS — N186 End stage renal disease: Secondary | ICD-10-CM | POA: Diagnosis not present

## 2015-03-06 DIAGNOSIS — D631 Anemia in chronic kidney disease: Secondary | ICD-10-CM | POA: Diagnosis not present

## 2015-03-06 DIAGNOSIS — N184 Chronic kidney disease, stage 4 (severe): Secondary | ICD-10-CM | POA: Diagnosis not present

## 2015-03-06 DIAGNOSIS — N2581 Secondary hyperparathyroidism of renal origin: Secondary | ICD-10-CM | POA: Diagnosis not present

## 2015-03-08 DIAGNOSIS — N184 Chronic kidney disease, stage 4 (severe): Secondary | ICD-10-CM | POA: Diagnosis not present

## 2015-03-08 DIAGNOSIS — N2581 Secondary hyperparathyroidism of renal origin: Secondary | ICD-10-CM | POA: Diagnosis not present

## 2015-03-08 DIAGNOSIS — N186 End stage renal disease: Secondary | ICD-10-CM | POA: Diagnosis not present

## 2015-03-08 DIAGNOSIS — D631 Anemia in chronic kidney disease: Secondary | ICD-10-CM | POA: Diagnosis not present

## 2015-03-12 DIAGNOSIS — N184 Chronic kidney disease, stage 4 (severe): Secondary | ICD-10-CM | POA: Diagnosis not present

## 2015-03-12 DIAGNOSIS — N2581 Secondary hyperparathyroidism of renal origin: Secondary | ICD-10-CM | POA: Diagnosis not present

## 2015-03-12 DIAGNOSIS — N186 End stage renal disease: Secondary | ICD-10-CM | POA: Diagnosis not present

## 2015-03-12 DIAGNOSIS — D631 Anemia in chronic kidney disease: Secondary | ICD-10-CM | POA: Diagnosis not present

## 2015-03-13 DIAGNOSIS — N186 End stage renal disease: Secondary | ICD-10-CM | POA: Diagnosis not present

## 2015-03-13 DIAGNOSIS — D631 Anemia in chronic kidney disease: Secondary | ICD-10-CM | POA: Diagnosis not present

## 2015-03-13 DIAGNOSIS — N184 Chronic kidney disease, stage 4 (severe): Secondary | ICD-10-CM | POA: Diagnosis not present

## 2015-03-13 DIAGNOSIS — N2581 Secondary hyperparathyroidism of renal origin: Secondary | ICD-10-CM | POA: Diagnosis not present

## 2015-03-15 DIAGNOSIS — D631 Anemia in chronic kidney disease: Secondary | ICD-10-CM | POA: Diagnosis not present

## 2015-03-15 DIAGNOSIS — N186 End stage renal disease: Secondary | ICD-10-CM | POA: Diagnosis not present

## 2015-03-15 DIAGNOSIS — N184 Chronic kidney disease, stage 4 (severe): Secondary | ICD-10-CM | POA: Diagnosis not present

## 2015-03-15 DIAGNOSIS — N2581 Secondary hyperparathyroidism of renal origin: Secondary | ICD-10-CM | POA: Diagnosis not present

## 2015-03-18 DIAGNOSIS — N184 Chronic kidney disease, stage 4 (severe): Secondary | ICD-10-CM | POA: Diagnosis not present

## 2015-03-18 DIAGNOSIS — N186 End stage renal disease: Secondary | ICD-10-CM | POA: Diagnosis not present

## 2015-03-18 DIAGNOSIS — D631 Anemia in chronic kidney disease: Secondary | ICD-10-CM | POA: Diagnosis not present

## 2015-03-18 DIAGNOSIS — N2581 Secondary hyperparathyroidism of renal origin: Secondary | ICD-10-CM | POA: Diagnosis not present

## 2015-03-20 DIAGNOSIS — N184 Chronic kidney disease, stage 4 (severe): Secondary | ICD-10-CM | POA: Diagnosis not present

## 2015-03-20 DIAGNOSIS — D631 Anemia in chronic kidney disease: Secondary | ICD-10-CM | POA: Diagnosis not present

## 2015-03-20 DIAGNOSIS — N186 End stage renal disease: Secondary | ICD-10-CM | POA: Diagnosis not present

## 2015-03-20 DIAGNOSIS — N2581 Secondary hyperparathyroidism of renal origin: Secondary | ICD-10-CM | POA: Diagnosis not present

## 2015-03-22 DIAGNOSIS — N184 Chronic kidney disease, stage 4 (severe): Secondary | ICD-10-CM | POA: Diagnosis not present

## 2015-03-22 DIAGNOSIS — N2581 Secondary hyperparathyroidism of renal origin: Secondary | ICD-10-CM | POA: Diagnosis not present

## 2015-03-22 DIAGNOSIS — D631 Anemia in chronic kidney disease: Secondary | ICD-10-CM | POA: Diagnosis not present

## 2015-03-22 DIAGNOSIS — N186 End stage renal disease: Secondary | ICD-10-CM | POA: Diagnosis not present

## 2015-03-25 DIAGNOSIS — D631 Anemia in chronic kidney disease: Secondary | ICD-10-CM | POA: Diagnosis not present

## 2015-03-25 DIAGNOSIS — N2581 Secondary hyperparathyroidism of renal origin: Secondary | ICD-10-CM | POA: Diagnosis not present

## 2015-03-25 DIAGNOSIS — N186 End stage renal disease: Secondary | ICD-10-CM | POA: Diagnosis not present

## 2015-03-25 DIAGNOSIS — N184 Chronic kidney disease, stage 4 (severe): Secondary | ICD-10-CM | POA: Diagnosis not present

## 2015-03-26 ENCOUNTER — Other Ambulatory Visit: Payer: Self-pay | Admitting: Internal Medicine

## 2015-03-27 DIAGNOSIS — N186 End stage renal disease: Secondary | ICD-10-CM | POA: Diagnosis not present

## 2015-03-27 DIAGNOSIS — D631 Anemia in chronic kidney disease: Secondary | ICD-10-CM | POA: Diagnosis not present

## 2015-03-27 DIAGNOSIS — N184 Chronic kidney disease, stage 4 (severe): Secondary | ICD-10-CM | POA: Diagnosis not present

## 2015-03-27 DIAGNOSIS — N2581 Secondary hyperparathyroidism of renal origin: Secondary | ICD-10-CM | POA: Diagnosis not present

## 2015-03-29 DIAGNOSIS — D631 Anemia in chronic kidney disease: Secondary | ICD-10-CM | POA: Diagnosis not present

## 2015-03-29 DIAGNOSIS — N2581 Secondary hyperparathyroidism of renal origin: Secondary | ICD-10-CM | POA: Diagnosis not present

## 2015-03-29 DIAGNOSIS — N186 End stage renal disease: Secondary | ICD-10-CM | POA: Diagnosis not present

## 2015-03-29 DIAGNOSIS — N184 Chronic kidney disease, stage 4 (severe): Secondary | ICD-10-CM | POA: Diagnosis not present

## 2015-04-01 DIAGNOSIS — N186 End stage renal disease: Secondary | ICD-10-CM | POA: Diagnosis not present

## 2015-04-01 DIAGNOSIS — N2581 Secondary hyperparathyroidism of renal origin: Secondary | ICD-10-CM | POA: Diagnosis not present

## 2015-04-01 DIAGNOSIS — N184 Chronic kidney disease, stage 4 (severe): Secondary | ICD-10-CM | POA: Diagnosis not present

## 2015-04-01 DIAGNOSIS — D631 Anemia in chronic kidney disease: Secondary | ICD-10-CM | POA: Diagnosis not present

## 2015-04-03 DIAGNOSIS — N184 Chronic kidney disease, stage 4 (severe): Secondary | ICD-10-CM | POA: Diagnosis not present

## 2015-04-03 DIAGNOSIS — D631 Anemia in chronic kidney disease: Secondary | ICD-10-CM | POA: Diagnosis not present

## 2015-04-03 DIAGNOSIS — N186 End stage renal disease: Secondary | ICD-10-CM | POA: Diagnosis not present

## 2015-04-03 DIAGNOSIS — N2581 Secondary hyperparathyroidism of renal origin: Secondary | ICD-10-CM | POA: Diagnosis not present

## 2015-04-04 DIAGNOSIS — Z992 Dependence on renal dialysis: Secondary | ICD-10-CM | POA: Diagnosis not present

## 2015-04-04 DIAGNOSIS — N2889 Other specified disorders of kidney and ureter: Secondary | ICD-10-CM | POA: Diagnosis not present

## 2015-04-04 DIAGNOSIS — N186 End stage renal disease: Secondary | ICD-10-CM | POA: Diagnosis not present

## 2015-04-05 DIAGNOSIS — D631 Anemia in chronic kidney disease: Secondary | ICD-10-CM | POA: Diagnosis not present

## 2015-04-05 DIAGNOSIS — N186 End stage renal disease: Secondary | ICD-10-CM | POA: Diagnosis not present

## 2015-04-05 DIAGNOSIS — N184 Chronic kidney disease, stage 4 (severe): Secondary | ICD-10-CM | POA: Diagnosis not present

## 2015-04-05 DIAGNOSIS — N2581 Secondary hyperparathyroidism of renal origin: Secondary | ICD-10-CM | POA: Diagnosis not present

## 2015-04-08 DIAGNOSIS — N2581 Secondary hyperparathyroidism of renal origin: Secondary | ICD-10-CM | POA: Diagnosis not present

## 2015-04-08 DIAGNOSIS — N184 Chronic kidney disease, stage 4 (severe): Secondary | ICD-10-CM | POA: Diagnosis not present

## 2015-04-08 DIAGNOSIS — D631 Anemia in chronic kidney disease: Secondary | ICD-10-CM | POA: Diagnosis not present

## 2015-04-08 DIAGNOSIS — N186 End stage renal disease: Secondary | ICD-10-CM | POA: Diagnosis not present

## 2015-04-10 DIAGNOSIS — N186 End stage renal disease: Secondary | ICD-10-CM | POA: Diagnosis not present

## 2015-04-10 DIAGNOSIS — N184 Chronic kidney disease, stage 4 (severe): Secondary | ICD-10-CM | POA: Diagnosis not present

## 2015-04-10 DIAGNOSIS — D631 Anemia in chronic kidney disease: Secondary | ICD-10-CM | POA: Diagnosis not present

## 2015-04-10 DIAGNOSIS — N2581 Secondary hyperparathyroidism of renal origin: Secondary | ICD-10-CM | POA: Diagnosis not present

## 2015-04-12 DIAGNOSIS — N2581 Secondary hyperparathyroidism of renal origin: Secondary | ICD-10-CM | POA: Diagnosis not present

## 2015-04-12 DIAGNOSIS — N184 Chronic kidney disease, stage 4 (severe): Secondary | ICD-10-CM | POA: Diagnosis not present

## 2015-04-12 DIAGNOSIS — D631 Anemia in chronic kidney disease: Secondary | ICD-10-CM | POA: Diagnosis not present

## 2015-04-12 DIAGNOSIS — N186 End stage renal disease: Secondary | ICD-10-CM | POA: Diagnosis not present

## 2015-04-15 DIAGNOSIS — D631 Anemia in chronic kidney disease: Secondary | ICD-10-CM | POA: Diagnosis not present

## 2015-04-15 DIAGNOSIS — N184 Chronic kidney disease, stage 4 (severe): Secondary | ICD-10-CM | POA: Diagnosis not present

## 2015-04-15 DIAGNOSIS — N2581 Secondary hyperparathyroidism of renal origin: Secondary | ICD-10-CM | POA: Diagnosis not present

## 2015-04-15 DIAGNOSIS — N186 End stage renal disease: Secondary | ICD-10-CM | POA: Diagnosis not present

## 2015-04-17 DIAGNOSIS — N2581 Secondary hyperparathyroidism of renal origin: Secondary | ICD-10-CM | POA: Diagnosis not present

## 2015-04-17 DIAGNOSIS — N186 End stage renal disease: Secondary | ICD-10-CM | POA: Diagnosis not present

## 2015-04-17 DIAGNOSIS — D631 Anemia in chronic kidney disease: Secondary | ICD-10-CM | POA: Diagnosis not present

## 2015-04-17 DIAGNOSIS — N184 Chronic kidney disease, stage 4 (severe): Secondary | ICD-10-CM | POA: Diagnosis not present

## 2015-04-19 DIAGNOSIS — N184 Chronic kidney disease, stage 4 (severe): Secondary | ICD-10-CM | POA: Diagnosis not present

## 2015-04-19 DIAGNOSIS — N186 End stage renal disease: Secondary | ICD-10-CM | POA: Diagnosis not present

## 2015-04-19 DIAGNOSIS — N2581 Secondary hyperparathyroidism of renal origin: Secondary | ICD-10-CM | POA: Diagnosis not present

## 2015-04-19 DIAGNOSIS — D631 Anemia in chronic kidney disease: Secondary | ICD-10-CM | POA: Diagnosis not present

## 2015-04-22 DIAGNOSIS — N184 Chronic kidney disease, stage 4 (severe): Secondary | ICD-10-CM | POA: Diagnosis not present

## 2015-04-22 DIAGNOSIS — N2581 Secondary hyperparathyroidism of renal origin: Secondary | ICD-10-CM | POA: Diagnosis not present

## 2015-04-22 DIAGNOSIS — N186 End stage renal disease: Secondary | ICD-10-CM | POA: Diagnosis not present

## 2015-04-22 DIAGNOSIS — D631 Anemia in chronic kidney disease: Secondary | ICD-10-CM | POA: Diagnosis not present

## 2015-04-23 DIAGNOSIS — I10 Essential (primary) hypertension: Secondary | ICD-10-CM | POA: Diagnosis not present

## 2015-04-23 DIAGNOSIS — Z79899 Other long term (current) drug therapy: Secondary | ICD-10-CM | POA: Diagnosis not present

## 2015-04-23 DIAGNOSIS — Z992 Dependence on renal dialysis: Secondary | ICD-10-CM | POA: Diagnosis not present

## 2015-04-23 LAB — HEPATIC FUNCTION PANEL
ALT: 11 U/L (ref 10–40)
AST: 8 U/L — AB (ref 14–40)
Alkaline Phosphatase: 82 U/L (ref 25–125)
Bilirubin, Total: 0.4 mg/dL

## 2015-04-23 LAB — BASIC METABOLIC PANEL
BUN: 39 mg/dL — AB (ref 4–21)
Creatinine: 5.4 mg/dL — AB (ref 0.6–1.3)
Glucose: 105 mg/dL
Potassium: 5 mmol/L (ref 3.4–5.3)
Sodium: 139 mmol/L (ref 137–147)

## 2015-04-23 LAB — CBC AND DIFFERENTIAL
HCT: 42 % (ref 41–53)
Hemoglobin: 13.2 g/dL — AB (ref 13.5–17.5)
Platelets: 154 10*3/uL (ref 150–399)
WBC: 8.1 10^3/mL

## 2015-04-24 ENCOUNTER — Encounter: Payer: Self-pay | Admitting: Internal Medicine

## 2015-04-24 DIAGNOSIS — D631 Anemia in chronic kidney disease: Secondary | ICD-10-CM | POA: Diagnosis not present

## 2015-04-24 DIAGNOSIS — N186 End stage renal disease: Secondary | ICD-10-CM | POA: Diagnosis not present

## 2015-04-24 DIAGNOSIS — N184 Chronic kidney disease, stage 4 (severe): Secondary | ICD-10-CM | POA: Diagnosis not present

## 2015-04-24 DIAGNOSIS — N2581 Secondary hyperparathyroidism of renal origin: Secondary | ICD-10-CM | POA: Diagnosis not present

## 2015-04-26 DIAGNOSIS — D631 Anemia in chronic kidney disease: Secondary | ICD-10-CM | POA: Diagnosis not present

## 2015-04-26 DIAGNOSIS — N186 End stage renal disease: Secondary | ICD-10-CM | POA: Diagnosis not present

## 2015-04-26 DIAGNOSIS — N2581 Secondary hyperparathyroidism of renal origin: Secondary | ICD-10-CM | POA: Diagnosis not present

## 2015-04-26 DIAGNOSIS — N184 Chronic kidney disease, stage 4 (severe): Secondary | ICD-10-CM | POA: Diagnosis not present

## 2015-04-29 DIAGNOSIS — N184 Chronic kidney disease, stage 4 (severe): Secondary | ICD-10-CM | POA: Diagnosis not present

## 2015-04-29 DIAGNOSIS — N186 End stage renal disease: Secondary | ICD-10-CM | POA: Diagnosis not present

## 2015-04-29 DIAGNOSIS — N2581 Secondary hyperparathyroidism of renal origin: Secondary | ICD-10-CM | POA: Diagnosis not present

## 2015-04-29 DIAGNOSIS — D631 Anemia in chronic kidney disease: Secondary | ICD-10-CM | POA: Diagnosis not present

## 2015-04-30 ENCOUNTER — Encounter: Payer: Self-pay | Admitting: Internal Medicine

## 2015-04-30 ENCOUNTER — Non-Acute Institutional Stay: Payer: Medicare Other | Admitting: Internal Medicine

## 2015-04-30 VITALS — BP 102/58 | HR 65 | Temp 97.7°F | Ht 69.0 in | Wt 161.0 lb

## 2015-04-30 DIAGNOSIS — G4700 Insomnia, unspecified: Secondary | ICD-10-CM | POA: Diagnosis not present

## 2015-04-30 DIAGNOSIS — I10 Essential (primary) hypertension: Secondary | ICD-10-CM | POA: Diagnosis not present

## 2015-04-30 DIAGNOSIS — K219 Gastro-esophageal reflux disease without esophagitis: Secondary | ICD-10-CM

## 2015-04-30 DIAGNOSIS — Z954 Presence of other heart-valve replacement: Secondary | ICD-10-CM | POA: Diagnosis not present

## 2015-04-30 DIAGNOSIS — I25110 Atherosclerotic heart disease of native coronary artery with unstable angina pectoris: Secondary | ICD-10-CM

## 2015-04-30 DIAGNOSIS — Z23 Encounter for immunization: Secondary | ICD-10-CM | POA: Diagnosis not present

## 2015-04-30 DIAGNOSIS — N186 End stage renal disease: Secondary | ICD-10-CM

## 2015-04-30 DIAGNOSIS — Z992 Dependence on renal dialysis: Secondary | ICD-10-CM | POA: Diagnosis not present

## 2015-04-30 DIAGNOSIS — Z0000 Encounter for general adult medical examination without abnormal findings: Secondary | ICD-10-CM | POA: Diagnosis not present

## 2015-04-30 DIAGNOSIS — Z952 Presence of prosthetic heart valve: Secondary | ICD-10-CM

## 2015-04-30 DIAGNOSIS — I25119 Atherosclerotic heart disease of native coronary artery with unspecified angina pectoris: Secondary | ICD-10-CM | POA: Diagnosis not present

## 2015-04-30 NOTE — Progress Notes (Signed)
Patient ID: Eric [name], male   DOB: Feb 19, 1930, 82 y.o.   MRN: [id number] MMSE 30/30 passed clock drawing

## 2015-04-30 NOTE — Progress Notes (Signed)
Patient ID: Eric [name], male   DOB: [birth year], 82 y.o.   MRN: [id number]   Location:  [facility] (12) Provider: Elward Nocera L. Mariea Clonts, D.O., C.M.D.  Patient Care Team: Gayland Curry, DO as PCP - General (Geriatric Medicine) Mauricia Area, MD as Consulting Physician (Nephrology) Dwana Melena, MD as Attending Physician (Nephrology)  Extended Emergency Contact Information Primary Emergency Contact: [facility] Address: [address]           [place], [place] [address] [name] of [place] Phone: [phone] Work Phone: [phone] Mobile Phone: [phone] Relation: Daughter Secondary Emergency Contact: Beckey Rutter Address: [address]           [place], [place] [address] [name] of [place] Phone: [phone] Mobile Phone: [phone] Relation: Spouse  Code Status: DNR Goals of Care: Advanced Directive information Advanced Directives 04/30/2015  Does patient have an advance directive? Yes  Type of Advance Directive [place]  Does patient want to make changes to advanced directive? -  Copy of advanced directive(s) in chart? Yes   Chief Complaint  Patient presents with  . Annual Exam    wellness  . MMSE    30/30 passed clock drawing    HPI: Patient is a 82 y.o. male seen in today for an annual wellness exam.    CAD s/p CABG and AVR in Dec:  Did not go to rehab.  I have seen him in between when his wife has had appts, but he did not come to [facility] or have any therapy here postop.  He was still weak and fatigued the last time I ran into him.  He's now had a dramatic improvement in his energy now.  Is back to his routine mostly, but overslept a few times lately and missed his workout.  Says he was never short of breath, but had appeared that way to [place] during his acute cardiac event.    Outside of his chronic insomnia for which meds have not been a success, he is doing well.   He also gets up frequently at night to urinate which does not help with this problem.    Depression screen [facility] 2/9 04/30/2015 09/24/2013  Decreased Interest 0 0  Down, Depressed, Hopeless 0 0  PHQ - 2 Score 0 0    Fall Risk  04/30/2015 09/24/2013  Falls in the past year? No No   MMSE - Mini Mental State Exam 04/30/2015  Orientation to time 5  Orientation to Place 5  Registration 3  Attention/ Calculation 5  Recall 3  Language- name 2 objects 2  Language- repeat 1  Language- follow 3 step command 3  Language- read & follow direction 1  Write a sentence 1  Copy design 1  Total score 30  passed clock  Health Maintenance  Topic Date Due  . TETANUS/TDAP  06/22/1949  . PNA vac Low Risk Adult (2 of 2 - PCV13) 06/04/2009  . INFLUENZA VACCINE  08/05/2015  . ZOSTAVAX  Completed   Urinary incontinence?  Does still make urine.  Does have to get up "too often" at night.  Still does not sleep well.    Functional Status Survey: Is the patient deaf or have difficulty hearing?: No Does the patient have difficulty seeing, even when wearing glasses/contacts?: No Does the patient have difficulty concentrating, remembering, or making decisions?: No Does the patient have difficulty walking or climbing stairs?: No Does the patient have difficulty  dressing or bathing?: No Does the patient have difficulty doing errands alone such as visiting a doctor's office or shopping?: No Exercise?  Is walking on the treadmill again. Diet? Does not follow his dialysis diet well. No exam data present  Seeing well except readers for reading. Hearing ok he says.  His son does not think he hears well--does miss parts of discussions--seems certain tones. Dentition:  No difficulty with dental--overdue for appt. Pain:  None.  Past Medical History  Diagnosis Date  . CKD (chronic kidney disease) stage 5, GFR less than 15 ml/min (HCC)   . Unspecified essential hypertension   . GERD (gastroesophageal reflux disease)    . Gout   . Herpes zoster 04/2012  . Heart murmur     as a teen  . Anxiety   . Arthritis   . Complication of anesthesia     " one time my heart stopped due to a medication that I was on."   . Shortness of breath dyspnea     with exertion  . Hemodialysis patient [facility])     Tues, Thursday, Saturday    Past Surgical History  Procedure Laterality Date  . Appendectomy  1944  . Cataract extraction w/ intraocular lens implant Bilateral 2014    [facility]  . Dialysis fistula creation  08/04/2012 and 10/13    Dr. Harden Mo  . Colonoscopy  2013    Dr. Annamaria Helling [place], [place].  Marland Kitchen Av fistula placement Left     Left Cimino AVF placed in [place]   . Insertion of dialysis catheter Right 01-15-14    Right chest TDC placed by Dr. Augustin Coupe at [facility]  . Eye surgery      Cataract  . Av fistula placement Right 03/06/2014    Procedure: ARTERIOVENOUS (AV) FISTULA CREATION-right radiocephalic;  Surgeon: Mal Misty, MD;  Location: [place];  Service: Vascular;  Laterality: Right;  . Ligation of competing branches of arteriovenous fistula Right 05/08/2014    Procedure: LIGATION OF COMPETING BRANCHES OF RIGHT ARM RADIOCEPHALIC ARTERIOVENOUS FISTULA;  Surgeon: Mal Misty, MD;  Location: [place];  Service: Vascular;  Laterality: Right;  . Av fistula placement Right 07/15/2014    Procedure: ARTERIOVENOUS (AV) FISTULA CREATION;  Surgeon: Elam Dutch, MD;  Location: [facility] OR;  Service: Vascular;  Laterality: Right;  . Ligation of arteriovenous  fistula Right 07/15/2014    Procedure: LIGATION OF ARTERIOVENOUS  FISTULA  (RIGHT RADIOCEPHALIC);  Surgeon: Elam Dutch, MD;  Location: [place];  Service: Vascular;  Laterality: Right;  . Revison of arteriovenous fistula Right 07/15/2014    Procedure: EXPLORATION OF ARTERIOVENOUS FISTULA;  Surgeon: Elam Dutch, MD;  Location: [place];  Service: Vascular;  Laterality: Right;  . Cardiac catheterization N/A 11/27/2014    Procedure: Right/Left Heart  Cath and Coronary Angiography;  Surgeon: Burnell Blanks, MD;  Location: [facility];  Service: Cardiovascular;  Laterality: N/A;  . Aortic valve replacement N/A 12/09/2014    Procedure: AORTIC VALVE REPLACEMENT (AVR) WITH 23MM MAGNA EASE BIOPROSTHETIC VALVE;  Surgeon: Gaye Pollack, MD;  Location: [place] OR;  Service: Open Heart Surgery;  Laterality: N/A;  . Coronary artery bypass graft N/A 12/09/2014    Procedure: CORONARY ARTERY BYPASS GRAFTING (CABG), ON PUMP, TIMES THREE, USING LEFT INTERNAL MAMMARY ARTERY, RIGHT GREATER SAPHENOUS VEIN HARVESTED ENDOSCOPICALLY;  Surgeon: Gaye Pollack, MD;  Location: [facility] OR;  Service: Open Heart Surgery;  Laterality: N/A;  LIMA-LAD; SVG-OM; SVG-PD  .  Tee without cardioversion N/A 12/09/2014    Procedure: TRANSESOPHAGEAL ECHOCARDIOGRAM (TEE);  Surgeon: Gaye Pollack, MD;  Location: [place];  Service: Open Heart Surgery;  Laterality: N/A;    Social History   Social History  . Marital Status: Married    Spouse Name: N/A  . Number of Children: N/A  . Years of Education: 16   Occupational History  . retired Designer, fashion/clothing    Social History Main Topics  . Smoking status: Never Smoker   . Smokeless tobacco: Never Used  . Alcohol Use: 1.2 oz/week    1 Glasses of wine, 1 Shots of liquor per week     Comment: one glass a day of either wine or liquor  . Drug Use: No  . Sexual Activity: Not on file   Other Topics Concern  . Not on file   Social History Narrative   Patient is Married since 1958. Occupation: Licensed conveyancer   Lives in apartment,  Independent Living  section at [place] since 05/04/2013. Also has a home in [place], [place].   No Smoking history  Alcohol history: 5 drinks/ week   Regular exercise: 3-4 times a week , treadmill   Patient has Advanced planning documents: Living Will, HCPOA             No Known Allergies    Medication List       This list is accurate as of: 04/30/15 10:43 AM.  Always use your  most recent med list.               allopurinol 100 MG tablet  Commonly known as:  ZYLOPRIM  Take 1 tablet (100 mg total) by mouth daily.     amLODipine 10 MG tablet  Commonly known as:  NORVASC  Take one tablet by mouth once daily to control blood pressure     aspirin EC 81 MG tablet  Take 81 mg by mouth at bedtime.     atorvastatin 20 MG tablet  Commonly known as:  LIPITOR  Take one tablet by mouth once daily for cholesterol     calcium carbonate 500 MG chewable tablet  Commonly known as:  TUMS - dosed in mg elemental calcium  Chew 2 tablets by mouth 3 (three) times daily with meals.     metoprolol tartrate 25 MG tablet  Commonly known as:  LOPRESSOR  TAKE (1/2) TABLET TWICE DAILY.     omeprazole 20 MG capsule  Commonly known as:  PRILOSEC  Take 20 mg by mouth 4 (four) times a week. Sunday, Tuesday, Thursday and Saturday       Review of Systems:  Review of Systems  Constitutional: Negative for fever, chills and malaise/fatigue.  HENT: Positive for hearing loss. Negative for congestion.   Eyes: Negative for blurred vision.  Respiratory: Negative for cough and shortness of breath.   Cardiovascular: Negative for chest pain, palpitations and leg swelling.  Gastrointestinal: Negative for abdominal pain, constipation, blood in stool and melena.  Genitourinary: Positive for frequency. Negative for dysuria.  Musculoskeletal: Negative for falls.       Balance problems beginning  Skin: Negative for rash.  Neurological: Negative for dizziness, loss of consciousness and weakness.  Endo/Heme/Allergies: Does not bruise/bleed easily.  Psychiatric/Behavioral: Negative for depression and memory loss. The patient has insomnia.     Physical Exam: Filed Vitals:   04/30/15 1007  BP: 102/58  Pulse: 65  Temp: 97.7 F (36.5 C)  TempSrc: Oral  Height: 5\' 9"  (1.753  m)  Weight: 161 lb (73.029 kg)  SpO2: 98%   Body mass index is 23.76 kg/(m^2). Physical Exam  Constitutional:  He is oriented to person, place, and time. He appears well-developed and well-nourished. No distress.  HENT:  Head: Normocephalic and atraumatic.  Right Ear: External ear normal.  Left Ear: External ear normal.  Nose: Nose normal.  Mouth/Throat: Oropharynx is clear and moist. No oropharyngeal exudate.  Eyes: Conjunctivae and EOM are normal. Pupils are equal, round, and reactive to light.  Neck: Normal range of motion. Neck supple. No JVD present. No thyromegaly present.  Cardiovascular: Normal rate, regular rhythm, normal heart sounds and intact distal pulses.   Right AV fistula with normal pulse and thrill  Pulmonary/Chest: Effort normal and breath sounds normal. No respiratory distress.  Abdominal: Soft. Bowel sounds are normal. He exhibits no distension and no mass. There is no tenderness. There is no rebound and no guarding.  Musculoskeletal: Normal range of motion. He exhibits no edema or tenderness.  Lymphadenopathy:    He has no cervical adenopathy.  Neurological: He is alert and oriented to person, place, and time.  Skin: Skin is warm and dry.  Psychiatric: He has a normal mood and affect.    Labs reviewed: Basic Metabolic Panel:  Recent Labs  12/09/14 2030  12/10/14 0400 12/10/14 1630  12/13/14 0530 12/14/14 0240 12/27/14 0948 04/23/15 0700  NA  --   < > 137 136  < > 137 137 139 139  K  --   < > 6.5* 3.7  < > 2.9* 3.6 4.5 5.0  CL  --   < > 104 98*  < > 102 100* 100  --   CO2  --   --  21* 27  < > 27 28 30   --   GLUCOSE  --   < > 117* 129*  < > 110* 100* 140*  --   BUN  --   < > 38* 15  < > 28* 24* 33* 39*  CREATININE 6.56*  < > 6.94* 3.90*  3.82*  < > 4.52* 5.12* 4.92* 5.4*  CALCIUM  --   --  8.3* 8.3*  < > 8.3* 8.6* 9.6  --   MG 2.1  --  2.1 1.9  --   --   --   --   --   PHOS  --   --   --  5.1*  --  3.2  --   --   --   < > = values in this interval not displayed. Liver Function Tests:  Recent Labs  12/06/14 0911 12/10/14 1630 12/13/14 0530 04/23/15 0700    AST 13*  --   --  8*  ALT 13*  --   --  11  ALKPHOS 64  --   --  82  BILITOT 0.5  --   --   --   PROT 7.3  --   --   --   ALBUMIN 3.6 2.7* 2.3*  --    No results for input(s): LIPASE, AMYLASE in the last 8760 hours. No results for input(s): AMMONIA in the last 8760 hours. CBC:  Recent Labs  12/13/14 0530 12/14/14 0240 12/27/14 0948 04/23/15 0700  WBC 5.9 6.4 7.1 8.1  HGB 7.8* 7.9* 7.6* 13.2*  HCT 23.8* 24.7* 23.3* 42  MCV 101.3* 103.8* 94.0  --   PLT 112* 122* 340 154   Lipid Panel: No results for input(s): CHOL, HDL, LDLCALC, TRIG, CHOLHDL, LDLDIRECT  in the last 8760 hours. Lab Results  Component Value Date   HGBA1C 5.2 12/06/2014    Procedures reviewed: 11/25/14:  Echo:  EF 35-40%, mild pulm htn, severe aortic valve thickening and calcification, valve area 1.85cm^2, Vmax 1.29 cm^2, Vmean 1.42 cm^2, trivial TR, trivial PR, peak PA pressure [id number], grade 1 diastolic dysfunction  [id number] Right and left heart cath and angiogram:Mid LAD lesion, 99% stenosed.  Dist LAD lesion, 90% stenosed.  Ost 2nd Diag lesion, 90% stenosed.  Prox LAD to Mid LAD lesion, 80% stenosed.  2nd Mrg lesion, 60% stenosed.  Ost 3rd Mrg lesion, 100% stenosed.  Prox RCA lesion, 95% stenosed.  Mid RCA lesion, 99% stenosed.  Dist RCA lesion, 100% stenosed. 1. Severe triple vessel CAD 2. Severe stenosis in the heavily calcified mid LAD  3. Chronic occlusion OM branch 4. Chronic occlusion distal RCA 5. Ischemic cardiomyopathy with normal filling pressures.  6. Likely moderately severe AS by echo (based on appearance of the valve with restricted leaflet motion, mean gradient over 34mmHg). Recommendations: His CAD is diffuse and not favorable for PCI. He is a very active 82 yo man and despite ESRD on HD, he is functional. I will refer to CT surgery to discuss CABG with combined AVR.   12/06/14:  Vascular US doppler pre CABG carotid and arms/legs:  1. Carotid Duplex Evaluation - Mild plaque  with a 39% stenosis. 2. ABI Evaluation - Normal ABI at rest. 3. Palmar Arch Evaluation - Doppler waveforms remain within normal limits with both radial and ulnar compressions.  12/09/14:  Echo TEE (OR) 3D   01/10/15:  Echo complete w/o imaging enhancing agent:  EF 40-45%, hypokinesis of mid-apicalinferior and inferoseptal myocardium, grade 2 diastolic dysfunction, aortic bioprosthesis present and functioning normally with mild regurg  01/15/15:  CXR:  Trace right pleural effusion. Status post AVR replacement.  Assessment/Plan 1. Medicare annual wellness visit, subsequent -is up to date except his prevnar booster so that was given today  2. ESRD on dialysis [facility]) -continues three times weekly, has some difficulty with hypotension those days, but otherwise doing ok  3. Coronary artery disease involving native coronary artery of native heart with unstable angina pectoris ([place]) -doing much better now with energy levels -cont secondary prevention of MI with asa, lipitor, lopressor  4. S/P AVR -bioprosthetic, doing well, f/u echo was good  5. Essential hypertension -bp at goal with amlodipine, runs on the low side actually--may not need this over time if remains like this, also on lopressor for cardiac reasons  6. Gastroesophageal reflux disease without esophagitis -cont prilosec for this  7. Insomnia, unspecified -chronic problem--has not had benefit with melatonin, belsomra, ambien, and seems related to sleep at HD altering cycles  8.  Need for prevnar:  Was given today  Labs/tests ordered: no new Next appt:  6 mos for med mgt  Braxen Dobek L. Juanpablo Ciresi, D.O. [facility] [address], [place] [address] Cell Phone (Mon-Fri 8am-5pm):  [phone] On Call:  [phone] & follow prompts after 5pm & weekends Office Phone:  [phone] Office Fax:  [phone]

## 2015-05-01 DIAGNOSIS — D631 Anemia in chronic kidney disease: Secondary | ICD-10-CM | POA: Diagnosis not present

## 2015-05-01 DIAGNOSIS — N186 End stage renal disease: Secondary | ICD-10-CM | POA: Diagnosis not present

## 2015-05-01 DIAGNOSIS — N2581 Secondary hyperparathyroidism of renal origin: Secondary | ICD-10-CM | POA: Diagnosis not present

## 2015-05-01 DIAGNOSIS — N184 Chronic kidney disease, stage 4 (severe): Secondary | ICD-10-CM | POA: Diagnosis not present

## 2015-05-03 DIAGNOSIS — D631 Anemia in chronic kidney disease: Secondary | ICD-10-CM | POA: Diagnosis not present

## 2015-05-03 DIAGNOSIS — N184 Chronic kidney disease, stage 4 (severe): Secondary | ICD-10-CM | POA: Diagnosis not present

## 2015-05-03 DIAGNOSIS — N186 End stage renal disease: Secondary | ICD-10-CM | POA: Diagnosis not present

## 2015-05-03 DIAGNOSIS — N2581 Secondary hyperparathyroidism of renal origin: Secondary | ICD-10-CM | POA: Diagnosis not present

## 2015-05-04 DIAGNOSIS — Z992 Dependence on renal dialysis: Secondary | ICD-10-CM | POA: Diagnosis not present

## 2015-05-04 DIAGNOSIS — N2889 Other specified disorders of kidney and ureter: Secondary | ICD-10-CM | POA: Diagnosis not present

## 2015-05-04 DIAGNOSIS — N186 End stage renal disease: Secondary | ICD-10-CM | POA: Diagnosis not present

## 2015-05-05 DIAGNOSIS — N186 End stage renal disease: Secondary | ICD-10-CM | POA: Diagnosis not present

## 2015-05-05 DIAGNOSIS — D631 Anemia in chronic kidney disease: Secondary | ICD-10-CM | POA: Diagnosis not present

## 2015-05-05 DIAGNOSIS — N2581 Secondary hyperparathyroidism of renal origin: Secondary | ICD-10-CM | POA: Diagnosis not present

## 2015-05-05 DIAGNOSIS — N184 Chronic kidney disease, stage 4 (severe): Secondary | ICD-10-CM | POA: Diagnosis not present

## 2015-05-08 DIAGNOSIS — D631 Anemia in chronic kidney disease: Secondary | ICD-10-CM | POA: Diagnosis not present

## 2015-05-08 DIAGNOSIS — N2581 Secondary hyperparathyroidism of renal origin: Secondary | ICD-10-CM | POA: Diagnosis not present

## 2015-05-08 DIAGNOSIS — N186 End stage renal disease: Secondary | ICD-10-CM | POA: Diagnosis not present

## 2015-05-08 DIAGNOSIS — N184 Chronic kidney disease, stage 4 (severe): Secondary | ICD-10-CM | POA: Diagnosis not present

## 2015-05-10 DIAGNOSIS — N186 End stage renal disease: Secondary | ICD-10-CM | POA: Diagnosis not present

## 2015-05-10 DIAGNOSIS — N2581 Secondary hyperparathyroidism of renal origin: Secondary | ICD-10-CM | POA: Diagnosis not present

## 2015-05-10 DIAGNOSIS — N184 Chronic kidney disease, stage 4 (severe): Secondary | ICD-10-CM | POA: Diagnosis not present

## 2015-05-10 DIAGNOSIS — D631 Anemia in chronic kidney disease: Secondary | ICD-10-CM | POA: Diagnosis not present

## 2015-05-13 DIAGNOSIS — D631 Anemia in chronic kidney disease: Secondary | ICD-10-CM | POA: Diagnosis not present

## 2015-05-13 DIAGNOSIS — N2581 Secondary hyperparathyroidism of renal origin: Secondary | ICD-10-CM | POA: Diagnosis not present

## 2015-05-13 DIAGNOSIS — N184 Chronic kidney disease, stage 4 (severe): Secondary | ICD-10-CM | POA: Diagnosis not present

## 2015-05-13 DIAGNOSIS — N186 End stage renal disease: Secondary | ICD-10-CM | POA: Diagnosis not present

## 2015-05-15 DIAGNOSIS — D631 Anemia in chronic kidney disease: Secondary | ICD-10-CM | POA: Diagnosis not present

## 2015-05-15 DIAGNOSIS — N2581 Secondary hyperparathyroidism of renal origin: Secondary | ICD-10-CM | POA: Diagnosis not present

## 2015-05-15 DIAGNOSIS — N184 Chronic kidney disease, stage 4 (severe): Secondary | ICD-10-CM | POA: Diagnosis not present

## 2015-05-15 DIAGNOSIS — N186 End stage renal disease: Secondary | ICD-10-CM | POA: Diagnosis not present

## 2015-05-17 DIAGNOSIS — N2581 Secondary hyperparathyroidism of renal origin: Secondary | ICD-10-CM | POA: Diagnosis not present

## 2015-05-17 DIAGNOSIS — D631 Anemia in chronic kidney disease: Secondary | ICD-10-CM | POA: Diagnosis not present

## 2015-05-17 DIAGNOSIS — N184 Chronic kidney disease, stage 4 (severe): Secondary | ICD-10-CM | POA: Diagnosis not present

## 2015-05-17 DIAGNOSIS — N186 End stage renal disease: Secondary | ICD-10-CM | POA: Diagnosis not present

## 2015-05-20 DIAGNOSIS — N186 End stage renal disease: Secondary | ICD-10-CM | POA: Diagnosis not present

## 2015-05-20 DIAGNOSIS — D631 Anemia in chronic kidney disease: Secondary | ICD-10-CM | POA: Diagnosis not present

## 2015-05-20 DIAGNOSIS — N184 Chronic kidney disease, stage 4 (severe): Secondary | ICD-10-CM | POA: Diagnosis not present

## 2015-05-20 DIAGNOSIS — N2581 Secondary hyperparathyroidism of renal origin: Secondary | ICD-10-CM | POA: Diagnosis not present

## 2015-05-22 DIAGNOSIS — N186 End stage renal disease: Secondary | ICD-10-CM | POA: Diagnosis not present

## 2015-05-22 DIAGNOSIS — N184 Chronic kidney disease, stage 4 (severe): Secondary | ICD-10-CM | POA: Diagnosis not present

## 2015-05-22 DIAGNOSIS — N2581 Secondary hyperparathyroidism of renal origin: Secondary | ICD-10-CM | POA: Diagnosis not present

## 2015-05-22 DIAGNOSIS — D631 Anemia in chronic kidney disease: Secondary | ICD-10-CM | POA: Diagnosis not present

## 2015-05-24 DIAGNOSIS — N2581 Secondary hyperparathyroidism of renal origin: Secondary | ICD-10-CM | POA: Diagnosis not present

## 2015-05-24 DIAGNOSIS — N186 End stage renal disease: Secondary | ICD-10-CM | POA: Diagnosis not present

## 2015-05-24 DIAGNOSIS — D631 Anemia in chronic kidney disease: Secondary | ICD-10-CM | POA: Diagnosis not present

## 2015-05-24 DIAGNOSIS — N184 Chronic kidney disease, stage 4 (severe): Secondary | ICD-10-CM | POA: Diagnosis not present

## 2015-05-27 DIAGNOSIS — N2581 Secondary hyperparathyroidism of renal origin: Secondary | ICD-10-CM | POA: Diagnosis not present

## 2015-05-27 DIAGNOSIS — N186 End stage renal disease: Secondary | ICD-10-CM | POA: Diagnosis not present

## 2015-05-27 DIAGNOSIS — N184 Chronic kidney disease, stage 4 (severe): Secondary | ICD-10-CM | POA: Diagnosis not present

## 2015-05-27 DIAGNOSIS — D631 Anemia in chronic kidney disease: Secondary | ICD-10-CM | POA: Diagnosis not present

## 2015-05-29 DIAGNOSIS — N2581 Secondary hyperparathyroidism of renal origin: Secondary | ICD-10-CM | POA: Diagnosis not present

## 2015-05-29 DIAGNOSIS — N186 End stage renal disease: Secondary | ICD-10-CM | POA: Diagnosis not present

## 2015-05-29 DIAGNOSIS — N184 Chronic kidney disease, stage 4 (severe): Secondary | ICD-10-CM | POA: Diagnosis not present

## 2015-05-29 DIAGNOSIS — D631 Anemia in chronic kidney disease: Secondary | ICD-10-CM | POA: Diagnosis not present

## 2015-05-30 ENCOUNTER — Encounter: Payer: Self-pay | Admitting: Nurse Practitioner

## 2015-05-30 ENCOUNTER — Ambulatory Visit (INDEPENDENT_AMBULATORY_CARE_PROVIDER_SITE_OTHER): Payer: Medicare Other | Admitting: Nurse Practitioner

## 2015-05-30 VITALS — BP 110/62 | HR 64 | Ht 70.0 in | Wt 162.0 lb

## 2015-05-30 DIAGNOSIS — Z954 Presence of other heart-valve replacement: Secondary | ICD-10-CM

## 2015-05-30 DIAGNOSIS — Z951 Presence of aortocoronary bypass graft: Secondary | ICD-10-CM | POA: Diagnosis not present

## 2015-05-30 DIAGNOSIS — I519 Heart disease, unspecified: Secondary | ICD-10-CM | POA: Diagnosis not present

## 2015-05-30 DIAGNOSIS — Z952 Presence of prosthetic heart valve: Secondary | ICD-10-CM

## 2015-05-30 DIAGNOSIS — I25119 Atherosclerotic heart disease of native coronary artery with unspecified angina pectoris: Secondary | ICD-10-CM

## 2015-05-30 NOTE — Progress Notes (Addendum)
CARDIOLOGY OFFICE NOTE  Date:  05/30/2015    Eric [name] Date of Birth: 12-08-1930 Medical Record [id number]  PCP:  Hollace Kinnier, DO  Cardiologist:  Caryl Comes    Chief Complaint  Patient presents with  . Cardiomyopathy    Follow up visit - seen for Dr. Caryl Comes    History of Present Illness: Eric [name] is a 82 y.o. male who presents today for a follow up visit. Seen for Dr. Caryl Comes.   He has a history of ESRD on HD via AV fistula, insomnia, HTN, and GERD. Reported stress test at [facility] in [place], [place] 2 summers ago - noted in the record that he "has a problem with the one chamber of his heart not being attached properly and he knows about it since childhood". No actual documentation of this. Reports history of cardiac arrest approximately 2 years ago while having fistula procedure - sounds like he had a stress test at that time.   I saw him in the FLEX back in November as a new patient for DOE - had abnormal EKG and murmur on exam. Ended up getting echo, CT (negative for PE) and cath.   Cardiac cath on 11/27/2014 showed severe 3-vessel coronary disease. The proximal and mid LAD were heavily calcified with 80% proximal and 99% mid LAD stenosis followed by a 90% mid stenosis. The LCX had a 60% stenosis in a large OM2 and an occluded OM3 that long but small in diameter or underfilled by the collat from the LAD. The RCA is occluded distally with faint filling of a small diameter PDA branch by collat from the LAD. His echo showed at least moderate AS with a mean gradient of 36 mm Hg and a DI of 0.21. LVEF was reduced to 35% with anterior, septal and inferior wall motion abnormalities.   He underwent CABG x 3 with LIMA to LAD, SVG to OM2 and SVG to PDA with AVR with #23 Edwards Magna-Ease pericardial valve by Dr. Cyndia Bent on 12/09/2014. Nephrology assisted with his dialysis. He did initially require some inotropic support but this was able to be weaned over time without  difficulty. He had no significant postoperative cardiac dysrhythmias. He is on chronic Aranesp managed by the nephrologist.   Last seen back in January and he was doing well.   Comes back today. Here with his wife. Doing "fantastic". Says he is better now than he has been in years. No chest pain. Not short of breath. No swelling. Weight is down. He is very happy with how he is doing and has no complaints.    Past Medical History  Diagnosis Date  . CKD (chronic kidney disease) stage 5, GFR less than 15 ml/min (HCC)   . Unspecified essential hypertension   . GERD (gastroesophageal reflux disease)   . Gout   . Herpes zoster 04/2012  . Heart murmur     as a teen  . Anxiety   . Arthritis   . Complication of anesthesia     " one time my heart stopped due to a medication that I was on."   . Shortness of breath dyspnea     with exertion  . Hemodialysis patient [facility])     Tues, Thursday, Saturday    Past Surgical History  Procedure Laterality Date  . Appendectomy  1944  . Cataract extraction w/ intraocular lens implant Bilateral 2014    [place]  . Dialysis fistula creation  08/04/2012 and 10/13  Dr. Harden Mo  . Colonoscopy  2013    Dr. Annamaria Helling [place], [place].  Marland Kitchen Av fistula placement Left     Left Cimino AVF placed in [place]   . Insertion of dialysis catheter Right 01-15-14    Right chest TDC placed by Dr. Augustin Coupe at [facility]  . Eye surgery      Cataract  . Av fistula placement Right 03/06/2014    Procedure: ARTERIOVENOUS (AV) FISTULA CREATION-right radiocephalic;  Surgeon: Mal Misty, MD;  Location: [place];  Service: Vascular;  Laterality: Right;  . Ligation of competing branches of arteriovenous fistula Right 05/08/2014    Procedure: LIGATION OF COMPETING BRANCHES OF RIGHT ARM RADIOCEPHALIC ARTERIOVENOUS FISTULA;  Surgeon: Mal Misty, MD;  Location: [place];  Service: Vascular;  Laterality: Right;  . Av fistula placement Right 07/15/2014    Procedure:  ARTERIOVENOUS (AV) FISTULA CREATION;  Surgeon: Elam Dutch, MD;  Location: [facility] OR;  Service: Vascular;  Laterality: Right;  . Ligation of arteriovenous  fistula Right 07/15/2014    Procedure: LIGATION OF ARTERIOVENOUS  FISTULA  (RIGHT RADIOCEPHALIC);  Surgeon: Elam Dutch, MD;  Location: [place];  Service: Vascular;  Laterality: Right;  . Revison of arteriovenous fistula Right 07/15/2014    Procedure: EXPLORATION OF ARTERIOVENOUS FISTULA;  Surgeon: Elam Dutch, MD;  Location: [place];  Service: Vascular;  Laterality: Right;  . Cardiac catheterization N/A 11/27/2014    Procedure: Right/Left Heart Cath and Coronary Angiography;  Surgeon: Burnell Blanks, MD;  Location: [place] [facility];  Service: Cardiovascular;  Laterality: N/A;  . Aortic valve replacement N/A 12/09/2014    Procedure: AORTIC VALVE REPLACEMENT (AVR) WITH 23MM MAGNA EASE BIOPROSTHETIC VALVE;  Surgeon: Gaye Pollack, MD;  Location: [place] OR;  Service: Open Heart Surgery;  Laterality: N/A;  . Coronary artery bypass graft N/A 12/09/2014    Procedure: CORONARY ARTERY BYPASS GRAFTING (CABG), ON PUMP, TIMES THREE, USING LEFT INTERNAL MAMMARY ARTERY, RIGHT GREATER SAPHENOUS VEIN HARVESTED ENDOSCOPICALLY;  Surgeon: Gaye Pollack, MD;  Location: [place] [facility] OR;  Service: Open Heart Surgery;  Laterality: N/A;  LIMA-LAD; SVG-OM; SVG-PD  . Tee without cardioversion N/A 12/09/2014    Procedure: TRANSESOPHAGEAL ECHOCARDIOGRAM (TEE);  Surgeon: Gaye Pollack, MD;  Location: [place];  Service: Open Heart Surgery;  Laterality: N/A;     Medications: Current Outpatient Prescriptions  Medication Sig Dispense Refill  . allopurinol (ZYLOPRIM) 100 MG tablet Take 1 tablet (100 mg total) by mouth daily. 90 tablet 3  . amLODipine (NORVASC) 10 MG tablet Take one tablet by mouth once daily to control blood pressure 90 tablet 3  . aspirin EC 81 MG tablet Take 81 mg by mouth at bedtime.     Marland Kitchen atorvastatin (LIPITOR) 20 MG tablet Take one tablet by mouth once  daily for cholesterol 90 tablet 1  . calcium carbonate (TUMS - DOSED IN MG ELEMENTAL CALCIUM) 500 MG chewable tablet Chew 2 tablets by mouth 3 (three) times daily with meals.    . metoprolol tartrate (LOPRESSOR) 25 MG tablet TAKE (1/2) TABLET TWICE DAILY. 31 tablet 3  . omeprazole (PRILOSEC) 20 MG capsule Take 20 mg by mouth 4 (four) times a week. Sunday, Tuesday, Thursday and Saturday     No current facility-administered medications for this visit.    Allergies: No Known Allergies  Social History: The patient  reports that he has never smoked. He has never used smokeless tobacco. He reports that he drinks about 1.2 oz of alcohol  per week. He reports that he does not use illicit drugs.   Family History: The patient's family history includes Cancer in his father and mother.   Review of Systems: Please see the history of present illness.   Otherwise, the review of systems is positive for none.   All other systems are reviewed and negative.   Physical Exam: VS:  BP 110/62 mmHg  Pulse 64  Ht 5\' 10"  (1.778 m)  Wt 162 lb (73.483 kg)  BMI 23.24 kg/m2 .  BMI Body mass index is 23.24 kg/(m^2).  Wt Readings from Last 3 Encounters:  05/30/15 162 lb (73.483 kg)  04/30/15 161 lb (73.029 kg)  01/24/15 167 lb (75.751 kg)    General: Pleasant. Well developed, well nourished and in no acute distress.  HEENT: Normal. Neck: Supple, no JVD, carotid bruits, or masses noted.  Cardiac: Regular rate and rhythm. Harsh outflow murmur. No edema.  Respiratory:  Lungs are clear to auscultation bilaterally with normal work of breathing.  GI: Soft and nontender.  MS: No deformity or atrophy. Gait and ROM intact. Skin: Warm and dry. Color is normal.  Neuro:  Strength and sensation are intact and no gross focal deficits noted.  Psych: Alert, appropriate and with normal affect.   LABORATORY DATA:  EKG:  EKG is not ordered today.  Lab Results  Component Value Date   WBC 8.1 04/23/2015   HGB 13.2*  04/23/2015   HCT 42 04/23/2015   PLT 154 04/23/2015   GLUCOSE 140* 12/27/2014   CHOL 117 02/07/2013   TRIG 129 02/07/2013   HDL 36 02/07/2013   LDLCALC 55 02/07/2013   ALT 11 04/23/2015   AST 8* 04/23/2015   NA 139 04/23/2015   K 5.0 04/23/2015   CL 100 12/27/2014   CREATININE 5.4* 04/23/2015   BUN 39* 04/23/2015   CO2 30 12/27/2014   INR 1.42 12/09/2014   HGBA1C 5.2 12/06/2014    BNP (last 3 results) No results for input(s): BNP in the last 8760 hours.  ProBNP (last 3 results) No results for input(s): PROBNP in the last 8760 hours.   Other Studies Reviewed Today:  Echo Study Conclusions from 01/2015  - Left ventricle: The cavity size was normal. Wall thickness was increased in a pattern of mild LVH. Systolic function was mildly to moderately reduced. The estimated ejection fraction was in the range of 40% to 45%. Hypokinesis of the mid-apicalinferior and inferoseptal myocardium. Features are consistent with a pseudonormal left ventricular filling pattern, with concomitant abnormal relaxation and increased filling pressure (grade 2 diastolic dysfunction). - Ventricular septum: Septal motion showed paradox. These changes are consistent with a post-thoracotomy state. - Aortic valve: A bioprosthesis was present and functioning normally. There was mild regurgitation directed eccentrically in the LVOT and along the septum. There was no significant perivalvular regurgitation. - Mitral valve: There was mild to moderate regurgitation directed centrally. - Left atrium: The atrium was moderately dilated. - Right ventricle: Systolic function was mildly reduced. - Right atrium: The atrium was mildly dilated.  Assessment/Plan: 1. DOE - found to have AS as well as significant CAD - now s/p CABG and AVR - 5 months out. He looks great. No change with current regimen.   2. Mild LV dysfunction -totally asymptomatic  3. ESRD - on dialysis  4. Chronic  Anemia - remains on Aranesp - managed by Renal.   5. HLD - on statin therapy.  Current medicines are reviewed with the patient today.  The patient does  not have concerns regarding medicines other than what has been noted above.  The following changes have been made:  See above.  Labs/ tests ordered today include:   No orders of the defined types were placed in this encounter.     Disposition:   FU with Dr. Caryl Comes in one year.  Patient is agreeable to this plan and will call if any problems develop in the interim.   Signed: Burtis Junes, RN, ANP-C 05/30/2015 10:38 AM  [facility] [address] [place], [place]  [address] Phone: [phone] Fax: [phone]

## 2015-05-30 NOTE — Patient Instructions (Addendum)
We will be checking the following labs today - NONE   Medication Instructions:    Continue with your current medicines.     Testing/Procedures To Be Arranged:  N/A  Follow-Up:   See Dr. Caryl Comes in one year.     Other Special Instructions:   N/A    If you need a refill on your cardiac medications before your next appointment, please call your pharmacy.   Call the [place] office at [phone] if you have any questions, problems or concerns.

## 2015-05-31 DIAGNOSIS — N2581 Secondary hyperparathyroidism of renal origin: Secondary | ICD-10-CM | POA: Diagnosis not present

## 2015-05-31 DIAGNOSIS — N184 Chronic kidney disease, stage 4 (severe): Secondary | ICD-10-CM | POA: Diagnosis not present

## 2015-05-31 DIAGNOSIS — N186 End stage renal disease: Secondary | ICD-10-CM | POA: Diagnosis not present

## 2015-05-31 DIAGNOSIS — D631 Anemia in chronic kidney disease: Secondary | ICD-10-CM | POA: Diagnosis not present

## 2015-06-03 ENCOUNTER — Other Ambulatory Visit: Payer: Self-pay | Admitting: Internal Medicine

## 2015-06-03 DIAGNOSIS — N184 Chronic kidney disease, stage 4 (severe): Secondary | ICD-10-CM | POA: Diagnosis not present

## 2015-06-03 DIAGNOSIS — N186 End stage renal disease: Secondary | ICD-10-CM | POA: Diagnosis not present

## 2015-06-03 DIAGNOSIS — N2581 Secondary hyperparathyroidism of renal origin: Secondary | ICD-10-CM | POA: Diagnosis not present

## 2015-06-03 DIAGNOSIS — D631 Anemia in chronic kidney disease: Secondary | ICD-10-CM | POA: Diagnosis not present

## 2015-06-04 DIAGNOSIS — Z992 Dependence on renal dialysis: Secondary | ICD-10-CM | POA: Diagnosis not present

## 2015-06-04 DIAGNOSIS — N2889 Other specified disorders of kidney and ureter: Secondary | ICD-10-CM | POA: Diagnosis not present

## 2015-06-04 DIAGNOSIS — N186 End stage renal disease: Secondary | ICD-10-CM | POA: Diagnosis not present

## 2015-06-05 DIAGNOSIS — N184 Chronic kidney disease, stage 4 (severe): Secondary | ICD-10-CM | POA: Diagnosis not present

## 2015-06-05 DIAGNOSIS — D631 Anemia in chronic kidney disease: Secondary | ICD-10-CM | POA: Diagnosis not present

## 2015-06-05 DIAGNOSIS — N2581 Secondary hyperparathyroidism of renal origin: Secondary | ICD-10-CM | POA: Diagnosis not present

## 2015-06-05 DIAGNOSIS — N186 End stage renal disease: Secondary | ICD-10-CM | POA: Diagnosis not present

## 2015-06-07 DIAGNOSIS — N2581 Secondary hyperparathyroidism of renal origin: Secondary | ICD-10-CM | POA: Diagnosis not present

## 2015-06-07 DIAGNOSIS — D631 Anemia in chronic kidney disease: Secondary | ICD-10-CM | POA: Diagnosis not present

## 2015-06-07 DIAGNOSIS — N184 Chronic kidney disease, stage 4 (severe): Secondary | ICD-10-CM | POA: Diagnosis not present

## 2015-06-07 DIAGNOSIS — N186 End stage renal disease: Secondary | ICD-10-CM | POA: Diagnosis not present

## 2015-06-10 DIAGNOSIS — N186 End stage renal disease: Secondary | ICD-10-CM | POA: Diagnosis not present

## 2015-06-10 DIAGNOSIS — N2581 Secondary hyperparathyroidism of renal origin: Secondary | ICD-10-CM | POA: Diagnosis not present

## 2015-06-10 DIAGNOSIS — N184 Chronic kidney disease, stage 4 (severe): Secondary | ICD-10-CM | POA: Diagnosis not present

## 2015-06-10 DIAGNOSIS — D631 Anemia in chronic kidney disease: Secondary | ICD-10-CM | POA: Diagnosis not present

## 2015-06-12 DIAGNOSIS — D631 Anemia in chronic kidney disease: Secondary | ICD-10-CM | POA: Diagnosis not present

## 2015-06-12 DIAGNOSIS — N186 End stage renal disease: Secondary | ICD-10-CM | POA: Diagnosis not present

## 2015-06-12 DIAGNOSIS — N2581 Secondary hyperparathyroidism of renal origin: Secondary | ICD-10-CM | POA: Diagnosis not present

## 2015-06-12 DIAGNOSIS — N184 Chronic kidney disease, stage 4 (severe): Secondary | ICD-10-CM | POA: Diagnosis not present

## 2015-06-14 DIAGNOSIS — N186 End stage renal disease: Secondary | ICD-10-CM | POA: Diagnosis not present

## 2015-06-14 DIAGNOSIS — N2581 Secondary hyperparathyroidism of renal origin: Secondary | ICD-10-CM | POA: Diagnosis not present

## 2015-06-14 DIAGNOSIS — N184 Chronic kidney disease, stage 4 (severe): Secondary | ICD-10-CM | POA: Diagnosis not present

## 2015-06-14 DIAGNOSIS — D631 Anemia in chronic kidney disease: Secondary | ICD-10-CM | POA: Diagnosis not present

## 2015-06-17 DIAGNOSIS — N2581 Secondary hyperparathyroidism of renal origin: Secondary | ICD-10-CM | POA: Diagnosis not present

## 2015-06-17 DIAGNOSIS — N186 End stage renal disease: Secondary | ICD-10-CM | POA: Diagnosis not present

## 2015-06-17 DIAGNOSIS — N184 Chronic kidney disease, stage 4 (severe): Secondary | ICD-10-CM | POA: Diagnosis not present

## 2015-06-17 DIAGNOSIS — D631 Anemia in chronic kidney disease: Secondary | ICD-10-CM | POA: Diagnosis not present

## 2015-06-19 DIAGNOSIS — D631 Anemia in chronic kidney disease: Secondary | ICD-10-CM | POA: Diagnosis not present

## 2015-06-19 DIAGNOSIS — N184 Chronic kidney disease, stage 4 (severe): Secondary | ICD-10-CM | POA: Diagnosis not present

## 2015-06-19 DIAGNOSIS — N2581 Secondary hyperparathyroidism of renal origin: Secondary | ICD-10-CM | POA: Diagnosis not present

## 2015-06-19 DIAGNOSIS — N186 End stage renal disease: Secondary | ICD-10-CM | POA: Diagnosis not present

## 2015-06-20 DIAGNOSIS — H43813 Vitreous degeneration, bilateral: Secondary | ICD-10-CM | POA: Diagnosis not present

## 2015-06-21 DIAGNOSIS — N186 End stage renal disease: Secondary | ICD-10-CM | POA: Diagnosis not present

## 2015-06-21 DIAGNOSIS — D631 Anemia in chronic kidney disease: Secondary | ICD-10-CM | POA: Diagnosis not present

## 2015-06-21 DIAGNOSIS — N2581 Secondary hyperparathyroidism of renal origin: Secondary | ICD-10-CM | POA: Diagnosis not present

## 2015-06-21 DIAGNOSIS — N184 Chronic kidney disease, stage 4 (severe): Secondary | ICD-10-CM | POA: Diagnosis not present

## 2015-06-24 DIAGNOSIS — N184 Chronic kidney disease, stage 4 (severe): Secondary | ICD-10-CM | POA: Diagnosis not present

## 2015-06-24 DIAGNOSIS — N2581 Secondary hyperparathyroidism of renal origin: Secondary | ICD-10-CM | POA: Diagnosis not present

## 2015-06-24 DIAGNOSIS — D631 Anemia in chronic kidney disease: Secondary | ICD-10-CM | POA: Diagnosis not present

## 2015-06-24 DIAGNOSIS — N186 End stage renal disease: Secondary | ICD-10-CM | POA: Diagnosis not present

## 2015-06-26 DIAGNOSIS — D631 Anemia in chronic kidney disease: Secondary | ICD-10-CM | POA: Diagnosis not present

## 2015-06-26 DIAGNOSIS — N186 End stage renal disease: Secondary | ICD-10-CM | POA: Diagnosis not present

## 2015-06-26 DIAGNOSIS — N2581 Secondary hyperparathyroidism of renal origin: Secondary | ICD-10-CM | POA: Diagnosis not present

## 2015-06-26 DIAGNOSIS — N184 Chronic kidney disease, stage 4 (severe): Secondary | ICD-10-CM | POA: Diagnosis not present

## 2015-06-28 DIAGNOSIS — N2581 Secondary hyperparathyroidism of renal origin: Secondary | ICD-10-CM | POA: Diagnosis not present

## 2015-06-28 DIAGNOSIS — D631 Anemia in chronic kidney disease: Secondary | ICD-10-CM | POA: Diagnosis not present

## 2015-06-28 DIAGNOSIS — N184 Chronic kidney disease, stage 4 (severe): Secondary | ICD-10-CM | POA: Diagnosis not present

## 2015-06-28 DIAGNOSIS — N186 End stage renal disease: Secondary | ICD-10-CM | POA: Diagnosis not present

## 2015-07-01 DIAGNOSIS — D631 Anemia in chronic kidney disease: Secondary | ICD-10-CM | POA: Diagnosis not present

## 2015-07-01 DIAGNOSIS — N184 Chronic kidney disease, stage 4 (severe): Secondary | ICD-10-CM | POA: Diagnosis not present

## 2015-07-01 DIAGNOSIS — N2581 Secondary hyperparathyroidism of renal origin: Secondary | ICD-10-CM | POA: Diagnosis not present

## 2015-07-01 DIAGNOSIS — N186 End stage renal disease: Secondary | ICD-10-CM | POA: Diagnosis not present

## 2015-07-03 DIAGNOSIS — N184 Chronic kidney disease, stage 4 (severe): Secondary | ICD-10-CM | POA: Diagnosis not present

## 2015-07-03 DIAGNOSIS — N186 End stage renal disease: Secondary | ICD-10-CM | POA: Diagnosis not present

## 2015-07-03 DIAGNOSIS — N2581 Secondary hyperparathyroidism of renal origin: Secondary | ICD-10-CM | POA: Diagnosis not present

## 2015-07-03 DIAGNOSIS — D631 Anemia in chronic kidney disease: Secondary | ICD-10-CM | POA: Diagnosis not present

## 2015-07-04 DIAGNOSIS — N2889 Other specified disorders of kidney and ureter: Secondary | ICD-10-CM | POA: Diagnosis not present

## 2015-07-04 DIAGNOSIS — N186 End stage renal disease: Secondary | ICD-10-CM | POA: Diagnosis not present

## 2015-07-04 DIAGNOSIS — Z992 Dependence on renal dialysis: Secondary | ICD-10-CM | POA: Diagnosis not present

## 2015-07-05 DIAGNOSIS — N2581 Secondary hyperparathyroidism of renal origin: Secondary | ICD-10-CM | POA: Diagnosis not present

## 2015-07-05 DIAGNOSIS — N186 End stage renal disease: Secondary | ICD-10-CM | POA: Diagnosis not present

## 2015-07-05 DIAGNOSIS — D631 Anemia in chronic kidney disease: Secondary | ICD-10-CM | POA: Diagnosis not present

## 2015-07-05 DIAGNOSIS — N184 Chronic kidney disease, stage 4 (severe): Secondary | ICD-10-CM | POA: Diagnosis not present

## 2015-07-08 DIAGNOSIS — D631 Anemia in chronic kidney disease: Secondary | ICD-10-CM | POA: Diagnosis not present

## 2015-07-08 DIAGNOSIS — N2581 Secondary hyperparathyroidism of renal origin: Secondary | ICD-10-CM | POA: Diagnosis not present

## 2015-07-08 DIAGNOSIS — N186 End stage renal disease: Secondary | ICD-10-CM | POA: Diagnosis not present

## 2015-07-08 DIAGNOSIS — N184 Chronic kidney disease, stage 4 (severe): Secondary | ICD-10-CM | POA: Diagnosis not present

## 2015-07-10 DIAGNOSIS — N2581 Secondary hyperparathyroidism of renal origin: Secondary | ICD-10-CM | POA: Diagnosis not present

## 2015-07-10 DIAGNOSIS — D631 Anemia in chronic kidney disease: Secondary | ICD-10-CM | POA: Diagnosis not present

## 2015-07-10 DIAGNOSIS — N186 End stage renal disease: Secondary | ICD-10-CM | POA: Diagnosis not present

## 2015-07-10 DIAGNOSIS — N184 Chronic kidney disease, stage 4 (severe): Secondary | ICD-10-CM | POA: Diagnosis not present

## 2015-07-12 DIAGNOSIS — N2581 Secondary hyperparathyroidism of renal origin: Secondary | ICD-10-CM | POA: Diagnosis not present

## 2015-07-12 DIAGNOSIS — N184 Chronic kidney disease, stage 4 (severe): Secondary | ICD-10-CM | POA: Diagnosis not present

## 2015-07-12 DIAGNOSIS — D631 Anemia in chronic kidney disease: Secondary | ICD-10-CM | POA: Diagnosis not present

## 2015-07-12 DIAGNOSIS — N186 End stage renal disease: Secondary | ICD-10-CM | POA: Diagnosis not present

## 2015-07-15 DIAGNOSIS — N186 End stage renal disease: Secondary | ICD-10-CM | POA: Diagnosis not present

## 2015-07-15 DIAGNOSIS — N184 Chronic kidney disease, stage 4 (severe): Secondary | ICD-10-CM | POA: Diagnosis not present

## 2015-07-15 DIAGNOSIS — D631 Anemia in chronic kidney disease: Secondary | ICD-10-CM | POA: Diagnosis not present

## 2015-07-15 DIAGNOSIS — N2581 Secondary hyperparathyroidism of renal origin: Secondary | ICD-10-CM | POA: Diagnosis not present

## 2015-07-17 DIAGNOSIS — N184 Chronic kidney disease, stage 4 (severe): Secondary | ICD-10-CM | POA: Diagnosis not present

## 2015-07-17 DIAGNOSIS — N186 End stage renal disease: Secondary | ICD-10-CM | POA: Diagnosis not present

## 2015-07-17 DIAGNOSIS — D631 Anemia in chronic kidney disease: Secondary | ICD-10-CM | POA: Diagnosis not present

## 2015-07-17 DIAGNOSIS — N2581 Secondary hyperparathyroidism of renal origin: Secondary | ICD-10-CM | POA: Diagnosis not present

## 2015-07-19 DIAGNOSIS — D631 Anemia in chronic kidney disease: Secondary | ICD-10-CM | POA: Diagnosis not present

## 2015-07-19 DIAGNOSIS — N184 Chronic kidney disease, stage 4 (severe): Secondary | ICD-10-CM | POA: Diagnosis not present

## 2015-07-19 DIAGNOSIS — N186 End stage renal disease: Secondary | ICD-10-CM | POA: Diagnosis not present

## 2015-07-19 DIAGNOSIS — N2581 Secondary hyperparathyroidism of renal origin: Secondary | ICD-10-CM | POA: Diagnosis not present

## 2015-07-22 DIAGNOSIS — N184 Chronic kidney disease, stage 4 (severe): Secondary | ICD-10-CM | POA: Diagnosis not present

## 2015-07-22 DIAGNOSIS — N186 End stage renal disease: Secondary | ICD-10-CM | POA: Diagnosis not present

## 2015-07-22 DIAGNOSIS — N2581 Secondary hyperparathyroidism of renal origin: Secondary | ICD-10-CM | POA: Diagnosis not present

## 2015-07-22 DIAGNOSIS — D631 Anemia in chronic kidney disease: Secondary | ICD-10-CM | POA: Diagnosis not present

## 2015-07-24 DIAGNOSIS — D631 Anemia in chronic kidney disease: Secondary | ICD-10-CM | POA: Diagnosis not present

## 2015-07-24 DIAGNOSIS — N184 Chronic kidney disease, stage 4 (severe): Secondary | ICD-10-CM | POA: Diagnosis not present

## 2015-07-24 DIAGNOSIS — N2581 Secondary hyperparathyroidism of renal origin: Secondary | ICD-10-CM | POA: Diagnosis not present

## 2015-07-24 DIAGNOSIS — N186 End stage renal disease: Secondary | ICD-10-CM | POA: Diagnosis not present

## 2015-07-26 DIAGNOSIS — N184 Chronic kidney disease, stage 4 (severe): Secondary | ICD-10-CM | POA: Diagnosis not present

## 2015-07-26 DIAGNOSIS — N186 End stage renal disease: Secondary | ICD-10-CM | POA: Diagnosis not present

## 2015-07-26 DIAGNOSIS — D631 Anemia in chronic kidney disease: Secondary | ICD-10-CM | POA: Diagnosis not present

## 2015-07-26 DIAGNOSIS — N2581 Secondary hyperparathyroidism of renal origin: Secondary | ICD-10-CM | POA: Diagnosis not present

## 2015-07-29 DIAGNOSIS — N186 End stage renal disease: Secondary | ICD-10-CM | POA: Diagnosis not present

## 2015-07-29 DIAGNOSIS — N184 Chronic kidney disease, stage 4 (severe): Secondary | ICD-10-CM | POA: Diagnosis not present

## 2015-07-29 DIAGNOSIS — D631 Anemia in chronic kidney disease: Secondary | ICD-10-CM | POA: Diagnosis not present

## 2015-07-29 DIAGNOSIS — N2581 Secondary hyperparathyroidism of renal origin: Secondary | ICD-10-CM | POA: Diagnosis not present

## 2015-07-31 DIAGNOSIS — N2581 Secondary hyperparathyroidism of renal origin: Secondary | ICD-10-CM | POA: Diagnosis not present

## 2015-07-31 DIAGNOSIS — N184 Chronic kidney disease, stage 4 (severe): Secondary | ICD-10-CM | POA: Diagnosis not present

## 2015-07-31 DIAGNOSIS — D631 Anemia in chronic kidney disease: Secondary | ICD-10-CM | POA: Diagnosis not present

## 2015-07-31 DIAGNOSIS — N186 End stage renal disease: Secondary | ICD-10-CM | POA: Diagnosis not present

## 2015-08-02 DIAGNOSIS — N2581 Secondary hyperparathyroidism of renal origin: Secondary | ICD-10-CM | POA: Diagnosis not present

## 2015-08-02 DIAGNOSIS — N184 Chronic kidney disease, stage 4 (severe): Secondary | ICD-10-CM | POA: Diagnosis not present

## 2015-08-02 DIAGNOSIS — D631 Anemia in chronic kidney disease: Secondary | ICD-10-CM | POA: Diagnosis not present

## 2015-08-02 DIAGNOSIS — N186 End stage renal disease: Secondary | ICD-10-CM | POA: Diagnosis not present

## 2015-08-04 DIAGNOSIS — Z992 Dependence on renal dialysis: Secondary | ICD-10-CM | POA: Diagnosis not present

## 2015-08-04 DIAGNOSIS — N186 End stage renal disease: Secondary | ICD-10-CM | POA: Diagnosis not present

## 2015-08-04 DIAGNOSIS — N2889 Other specified disorders of kidney and ureter: Secondary | ICD-10-CM | POA: Diagnosis not present

## 2015-08-05 DIAGNOSIS — N184 Chronic kidney disease, stage 4 (severe): Secondary | ICD-10-CM | POA: Diagnosis not present

## 2015-08-05 DIAGNOSIS — D631 Anemia in chronic kidney disease: Secondary | ICD-10-CM | POA: Diagnosis not present

## 2015-08-05 DIAGNOSIS — N2581 Secondary hyperparathyroidism of renal origin: Secondary | ICD-10-CM | POA: Diagnosis not present

## 2015-08-05 DIAGNOSIS — N186 End stage renal disease: Secondary | ICD-10-CM | POA: Diagnosis not present

## 2015-08-07 DIAGNOSIS — N186 End stage renal disease: Secondary | ICD-10-CM | POA: Diagnosis not present

## 2015-08-07 DIAGNOSIS — D631 Anemia in chronic kidney disease: Secondary | ICD-10-CM | POA: Diagnosis not present

## 2015-08-07 DIAGNOSIS — N184 Chronic kidney disease, stage 4 (severe): Secondary | ICD-10-CM | POA: Diagnosis not present

## 2015-08-07 DIAGNOSIS — N2581 Secondary hyperparathyroidism of renal origin: Secondary | ICD-10-CM | POA: Diagnosis not present

## 2015-08-09 DIAGNOSIS — N184 Chronic kidney disease, stage 4 (severe): Secondary | ICD-10-CM | POA: Diagnosis not present

## 2015-08-09 DIAGNOSIS — N2581 Secondary hyperparathyroidism of renal origin: Secondary | ICD-10-CM | POA: Diagnosis not present

## 2015-08-09 DIAGNOSIS — D631 Anemia in chronic kidney disease: Secondary | ICD-10-CM | POA: Diagnosis not present

## 2015-08-09 DIAGNOSIS — N186 End stage renal disease: Secondary | ICD-10-CM | POA: Diagnosis not present

## 2015-08-12 DIAGNOSIS — N2581 Secondary hyperparathyroidism of renal origin: Secondary | ICD-10-CM | POA: Diagnosis not present

## 2015-08-12 DIAGNOSIS — N186 End stage renal disease: Secondary | ICD-10-CM | POA: Diagnosis not present

## 2015-08-12 DIAGNOSIS — D631 Anemia in chronic kidney disease: Secondary | ICD-10-CM | POA: Diagnosis not present

## 2015-08-12 DIAGNOSIS — N184 Chronic kidney disease, stage 4 (severe): Secondary | ICD-10-CM | POA: Diagnosis not present

## 2015-08-14 DIAGNOSIS — D631 Anemia in chronic kidney disease: Secondary | ICD-10-CM | POA: Diagnosis not present

## 2015-08-14 DIAGNOSIS — N2581 Secondary hyperparathyroidism of renal origin: Secondary | ICD-10-CM | POA: Diagnosis not present

## 2015-08-14 DIAGNOSIS — N186 End stage renal disease: Secondary | ICD-10-CM | POA: Diagnosis not present

## 2015-08-14 DIAGNOSIS — N184 Chronic kidney disease, stage 4 (severe): Secondary | ICD-10-CM | POA: Diagnosis not present

## 2015-08-16 DIAGNOSIS — N186 End stage renal disease: Secondary | ICD-10-CM | POA: Diagnosis not present

## 2015-08-16 DIAGNOSIS — D631 Anemia in chronic kidney disease: Secondary | ICD-10-CM | POA: Diagnosis not present

## 2015-08-16 DIAGNOSIS — N2581 Secondary hyperparathyroidism of renal origin: Secondary | ICD-10-CM | POA: Diagnosis not present

## 2015-08-16 DIAGNOSIS — N184 Chronic kidney disease, stage 4 (severe): Secondary | ICD-10-CM | POA: Diagnosis not present

## 2015-08-19 DIAGNOSIS — N186 End stage renal disease: Secondary | ICD-10-CM | POA: Diagnosis not present

## 2015-08-19 DIAGNOSIS — N2581 Secondary hyperparathyroidism of renal origin: Secondary | ICD-10-CM | POA: Diagnosis not present

## 2015-08-19 DIAGNOSIS — D631 Anemia in chronic kidney disease: Secondary | ICD-10-CM | POA: Diagnosis not present

## 2015-08-19 DIAGNOSIS — N184 Chronic kidney disease, stage 4 (severe): Secondary | ICD-10-CM | POA: Diagnosis not present

## 2015-08-21 DIAGNOSIS — N184 Chronic kidney disease, stage 4 (severe): Secondary | ICD-10-CM | POA: Diagnosis not present

## 2015-08-21 DIAGNOSIS — N186 End stage renal disease: Secondary | ICD-10-CM | POA: Diagnosis not present

## 2015-08-21 DIAGNOSIS — D631 Anemia in chronic kidney disease: Secondary | ICD-10-CM | POA: Diagnosis not present

## 2015-08-21 DIAGNOSIS — N2581 Secondary hyperparathyroidism of renal origin: Secondary | ICD-10-CM | POA: Diagnosis not present

## 2015-08-22 ENCOUNTER — Other Ambulatory Visit: Payer: Self-pay | Admitting: Internal Medicine

## 2015-08-23 DIAGNOSIS — D631 Anemia in chronic kidney disease: Secondary | ICD-10-CM | POA: Diagnosis not present

## 2015-08-23 DIAGNOSIS — N2581 Secondary hyperparathyroidism of renal origin: Secondary | ICD-10-CM | POA: Diagnosis not present

## 2015-08-23 DIAGNOSIS — N186 End stage renal disease: Secondary | ICD-10-CM | POA: Diagnosis not present

## 2015-08-23 DIAGNOSIS — N184 Chronic kidney disease, stage 4 (severe): Secondary | ICD-10-CM | POA: Diagnosis not present

## 2015-08-26 DIAGNOSIS — N2581 Secondary hyperparathyroidism of renal origin: Secondary | ICD-10-CM | POA: Diagnosis not present

## 2015-08-26 DIAGNOSIS — N184 Chronic kidney disease, stage 4 (severe): Secondary | ICD-10-CM | POA: Diagnosis not present

## 2015-08-26 DIAGNOSIS — N186 End stage renal disease: Secondary | ICD-10-CM | POA: Diagnosis not present

## 2015-08-26 DIAGNOSIS — D631 Anemia in chronic kidney disease: Secondary | ICD-10-CM | POA: Diagnosis not present

## 2015-08-28 DIAGNOSIS — D631 Anemia in chronic kidney disease: Secondary | ICD-10-CM | POA: Diagnosis not present

## 2015-08-28 DIAGNOSIS — N2581 Secondary hyperparathyroidism of renal origin: Secondary | ICD-10-CM | POA: Diagnosis not present

## 2015-08-28 DIAGNOSIS — N184 Chronic kidney disease, stage 4 (severe): Secondary | ICD-10-CM | POA: Diagnosis not present

## 2015-08-28 DIAGNOSIS — N186 End stage renal disease: Secondary | ICD-10-CM | POA: Diagnosis not present

## 2015-08-30 DIAGNOSIS — D631 Anemia in chronic kidney disease: Secondary | ICD-10-CM | POA: Diagnosis not present

## 2015-08-30 DIAGNOSIS — N2581 Secondary hyperparathyroidism of renal origin: Secondary | ICD-10-CM | POA: Diagnosis not present

## 2015-08-30 DIAGNOSIS — N186 End stage renal disease: Secondary | ICD-10-CM | POA: Diagnosis not present

## 2015-08-30 DIAGNOSIS — N184 Chronic kidney disease, stage 4 (severe): Secondary | ICD-10-CM | POA: Diagnosis not present

## 2015-09-02 DIAGNOSIS — N2581 Secondary hyperparathyroidism of renal origin: Secondary | ICD-10-CM | POA: Diagnosis not present

## 2015-09-02 DIAGNOSIS — N186 End stage renal disease: Secondary | ICD-10-CM | POA: Diagnosis not present

## 2015-09-02 DIAGNOSIS — N184 Chronic kidney disease, stage 4 (severe): Secondary | ICD-10-CM | POA: Diagnosis not present

## 2015-09-02 DIAGNOSIS — D631 Anemia in chronic kidney disease: Secondary | ICD-10-CM | POA: Diagnosis not present

## 2015-09-04 DIAGNOSIS — N2889 Other specified disorders of kidney and ureter: Secondary | ICD-10-CM | POA: Diagnosis not present

## 2015-09-04 DIAGNOSIS — N2581 Secondary hyperparathyroidism of renal origin: Secondary | ICD-10-CM | POA: Diagnosis not present

## 2015-09-04 DIAGNOSIS — N184 Chronic kidney disease, stage 4 (severe): Secondary | ICD-10-CM | POA: Diagnosis not present

## 2015-09-04 DIAGNOSIS — Z992 Dependence on renal dialysis: Secondary | ICD-10-CM | POA: Diagnosis not present

## 2015-09-04 DIAGNOSIS — D631 Anemia in chronic kidney disease: Secondary | ICD-10-CM | POA: Diagnosis not present

## 2015-09-04 DIAGNOSIS — N186 End stage renal disease: Secondary | ICD-10-CM | POA: Diagnosis not present

## 2015-09-06 DIAGNOSIS — Z23 Encounter for immunization: Secondary | ICD-10-CM | POA: Diagnosis not present

## 2015-09-06 DIAGNOSIS — N186 End stage renal disease: Secondary | ICD-10-CM | POA: Diagnosis not present

## 2015-09-06 DIAGNOSIS — D631 Anemia in chronic kidney disease: Secondary | ICD-10-CM | POA: Diagnosis not present

## 2015-09-06 DIAGNOSIS — N184 Chronic kidney disease, stage 4 (severe): Secondary | ICD-10-CM | POA: Diagnosis not present

## 2015-09-06 DIAGNOSIS — N2581 Secondary hyperparathyroidism of renal origin: Secondary | ICD-10-CM | POA: Diagnosis not present

## 2015-09-09 DIAGNOSIS — N2581 Secondary hyperparathyroidism of renal origin: Secondary | ICD-10-CM | POA: Diagnosis not present

## 2015-09-09 DIAGNOSIS — Z23 Encounter for immunization: Secondary | ICD-10-CM | POA: Diagnosis not present

## 2015-09-09 DIAGNOSIS — N186 End stage renal disease: Secondary | ICD-10-CM | POA: Diagnosis not present

## 2015-09-09 DIAGNOSIS — N184 Chronic kidney disease, stage 4 (severe): Secondary | ICD-10-CM | POA: Diagnosis not present

## 2015-09-09 DIAGNOSIS — D631 Anemia in chronic kidney disease: Secondary | ICD-10-CM | POA: Diagnosis not present

## 2015-09-11 DIAGNOSIS — N186 End stage renal disease: Secondary | ICD-10-CM | POA: Diagnosis not present

## 2015-09-11 DIAGNOSIS — N2581 Secondary hyperparathyroidism of renal origin: Secondary | ICD-10-CM | POA: Diagnosis not present

## 2015-09-11 DIAGNOSIS — Z23 Encounter for immunization: Secondary | ICD-10-CM | POA: Diagnosis not present

## 2015-09-11 DIAGNOSIS — N184 Chronic kidney disease, stage 4 (severe): Secondary | ICD-10-CM | POA: Diagnosis not present

## 2015-09-11 DIAGNOSIS — D631 Anemia in chronic kidney disease: Secondary | ICD-10-CM | POA: Diagnosis not present

## 2015-09-13 DIAGNOSIS — N186 End stage renal disease: Secondary | ICD-10-CM | POA: Diagnosis not present

## 2015-09-13 DIAGNOSIS — Z23 Encounter for immunization: Secondary | ICD-10-CM | POA: Diagnosis not present

## 2015-09-13 DIAGNOSIS — N184 Chronic kidney disease, stage 4 (severe): Secondary | ICD-10-CM | POA: Diagnosis not present

## 2015-09-13 DIAGNOSIS — N2581 Secondary hyperparathyroidism of renal origin: Secondary | ICD-10-CM | POA: Diagnosis not present

## 2015-09-13 DIAGNOSIS — D631 Anemia in chronic kidney disease: Secondary | ICD-10-CM | POA: Diagnosis not present

## 2015-09-16 DIAGNOSIS — N2581 Secondary hyperparathyroidism of renal origin: Secondary | ICD-10-CM | POA: Diagnosis not present

## 2015-09-16 DIAGNOSIS — Z23 Encounter for immunization: Secondary | ICD-10-CM | POA: Diagnosis not present

## 2015-09-16 DIAGNOSIS — N186 End stage renal disease: Secondary | ICD-10-CM | POA: Diagnosis not present

## 2015-09-16 DIAGNOSIS — D631 Anemia in chronic kidney disease: Secondary | ICD-10-CM | POA: Diagnosis not present

## 2015-09-16 DIAGNOSIS — N184 Chronic kidney disease, stage 4 (severe): Secondary | ICD-10-CM | POA: Diagnosis not present

## 2015-09-18 DIAGNOSIS — D631 Anemia in chronic kidney disease: Secondary | ICD-10-CM | POA: Diagnosis not present

## 2015-09-18 DIAGNOSIS — N186 End stage renal disease: Secondary | ICD-10-CM | POA: Diagnosis not present

## 2015-09-18 DIAGNOSIS — Z23 Encounter for immunization: Secondary | ICD-10-CM | POA: Diagnosis not present

## 2015-09-18 DIAGNOSIS — N184 Chronic kidney disease, stage 4 (severe): Secondary | ICD-10-CM | POA: Diagnosis not present

## 2015-09-18 DIAGNOSIS — N2581 Secondary hyperparathyroidism of renal origin: Secondary | ICD-10-CM | POA: Diagnosis not present

## 2015-09-20 DIAGNOSIS — D631 Anemia in chronic kidney disease: Secondary | ICD-10-CM | POA: Diagnosis not present

## 2015-09-20 DIAGNOSIS — N186 End stage renal disease: Secondary | ICD-10-CM | POA: Diagnosis not present

## 2015-09-20 DIAGNOSIS — Z23 Encounter for immunization: Secondary | ICD-10-CM | POA: Diagnosis not present

## 2015-09-20 DIAGNOSIS — N184 Chronic kidney disease, stage 4 (severe): Secondary | ICD-10-CM | POA: Diagnosis not present

## 2015-09-20 DIAGNOSIS — N2581 Secondary hyperparathyroidism of renal origin: Secondary | ICD-10-CM | POA: Diagnosis not present

## 2015-09-23 DIAGNOSIS — N184 Chronic kidney disease, stage 4 (severe): Secondary | ICD-10-CM | POA: Diagnosis not present

## 2015-09-23 DIAGNOSIS — D631 Anemia in chronic kidney disease: Secondary | ICD-10-CM | POA: Diagnosis not present

## 2015-09-23 DIAGNOSIS — N186 End stage renal disease: Secondary | ICD-10-CM | POA: Diagnosis not present

## 2015-09-23 DIAGNOSIS — Z23 Encounter for immunization: Secondary | ICD-10-CM | POA: Diagnosis not present

## 2015-09-23 DIAGNOSIS — N2581 Secondary hyperparathyroidism of renal origin: Secondary | ICD-10-CM | POA: Diagnosis not present

## 2015-09-25 DIAGNOSIS — N2581 Secondary hyperparathyroidism of renal origin: Secondary | ICD-10-CM | POA: Diagnosis not present

## 2015-09-25 DIAGNOSIS — D631 Anemia in chronic kidney disease: Secondary | ICD-10-CM | POA: Diagnosis not present

## 2015-09-25 DIAGNOSIS — Z23 Encounter for immunization: Secondary | ICD-10-CM | POA: Diagnosis not present

## 2015-09-25 DIAGNOSIS — N186 End stage renal disease: Secondary | ICD-10-CM | POA: Diagnosis not present

## 2015-09-25 DIAGNOSIS — N184 Chronic kidney disease, stage 4 (severe): Secondary | ICD-10-CM | POA: Diagnosis not present

## 2015-09-27 DIAGNOSIS — N2581 Secondary hyperparathyroidism of renal origin: Secondary | ICD-10-CM | POA: Diagnosis not present

## 2015-09-27 DIAGNOSIS — D631 Anemia in chronic kidney disease: Secondary | ICD-10-CM | POA: Diagnosis not present

## 2015-09-27 DIAGNOSIS — N186 End stage renal disease: Secondary | ICD-10-CM | POA: Diagnosis not present

## 2015-09-27 DIAGNOSIS — N184 Chronic kidney disease, stage 4 (severe): Secondary | ICD-10-CM | POA: Diagnosis not present

## 2015-09-27 DIAGNOSIS — Z23 Encounter for immunization: Secondary | ICD-10-CM | POA: Diagnosis not present

## 2015-09-30 DIAGNOSIS — Z23 Encounter for immunization: Secondary | ICD-10-CM | POA: Diagnosis not present

## 2015-09-30 DIAGNOSIS — N186 End stage renal disease: Secondary | ICD-10-CM | POA: Diagnosis not present

## 2015-09-30 DIAGNOSIS — N2581 Secondary hyperparathyroidism of renal origin: Secondary | ICD-10-CM | POA: Diagnosis not present

## 2015-09-30 DIAGNOSIS — N184 Chronic kidney disease, stage 4 (severe): Secondary | ICD-10-CM | POA: Diagnosis not present

## 2015-09-30 DIAGNOSIS — D631 Anemia in chronic kidney disease: Secondary | ICD-10-CM | POA: Diagnosis not present

## 2015-10-02 DIAGNOSIS — Z23 Encounter for immunization: Secondary | ICD-10-CM | POA: Diagnosis not present

## 2015-10-02 DIAGNOSIS — N184 Chronic kidney disease, stage 4 (severe): Secondary | ICD-10-CM | POA: Diagnosis not present

## 2015-10-02 DIAGNOSIS — N2581 Secondary hyperparathyroidism of renal origin: Secondary | ICD-10-CM | POA: Diagnosis not present

## 2015-10-02 DIAGNOSIS — D631 Anemia in chronic kidney disease: Secondary | ICD-10-CM | POA: Diagnosis not present

## 2015-10-02 DIAGNOSIS — N186 End stage renal disease: Secondary | ICD-10-CM | POA: Diagnosis not present

## 2015-10-04 DIAGNOSIS — N184 Chronic kidney disease, stage 4 (severe): Secondary | ICD-10-CM | POA: Diagnosis not present

## 2015-10-04 DIAGNOSIS — Z23 Encounter for immunization: Secondary | ICD-10-CM | POA: Diagnosis not present

## 2015-10-04 DIAGNOSIS — N2581 Secondary hyperparathyroidism of renal origin: Secondary | ICD-10-CM | POA: Diagnosis not present

## 2015-10-04 DIAGNOSIS — N186 End stage renal disease: Secondary | ICD-10-CM | POA: Diagnosis not present

## 2015-10-04 DIAGNOSIS — Z992 Dependence on renal dialysis: Secondary | ICD-10-CM | POA: Diagnosis not present

## 2015-10-04 DIAGNOSIS — N2889 Other specified disorders of kidney and ureter: Secondary | ICD-10-CM | POA: Diagnosis not present

## 2015-10-04 DIAGNOSIS — D631 Anemia in chronic kidney disease: Secondary | ICD-10-CM | POA: Diagnosis not present

## 2015-10-07 DIAGNOSIS — Z23 Encounter for immunization: Secondary | ICD-10-CM | POA: Diagnosis not present

## 2015-10-07 DIAGNOSIS — N186 End stage renal disease: Secondary | ICD-10-CM | POA: Diagnosis not present

## 2015-10-07 DIAGNOSIS — D631 Anemia in chronic kidney disease: Secondary | ICD-10-CM | POA: Diagnosis not present

## 2015-10-07 DIAGNOSIS — N184 Chronic kidney disease, stage 4 (severe): Secondary | ICD-10-CM | POA: Diagnosis not present

## 2015-10-07 DIAGNOSIS — N2581 Secondary hyperparathyroidism of renal origin: Secondary | ICD-10-CM | POA: Diagnosis not present

## 2015-10-09 DIAGNOSIS — N186 End stage renal disease: Secondary | ICD-10-CM | POA: Diagnosis not present

## 2015-10-09 DIAGNOSIS — N2581 Secondary hyperparathyroidism of renal origin: Secondary | ICD-10-CM | POA: Diagnosis not present

## 2015-10-09 DIAGNOSIS — Z23 Encounter for immunization: Secondary | ICD-10-CM | POA: Diagnosis not present

## 2015-10-09 DIAGNOSIS — D631 Anemia in chronic kidney disease: Secondary | ICD-10-CM | POA: Diagnosis not present

## 2015-10-09 DIAGNOSIS — N184 Chronic kidney disease, stage 4 (severe): Secondary | ICD-10-CM | POA: Diagnosis not present

## 2015-10-11 DIAGNOSIS — N184 Chronic kidney disease, stage 4 (severe): Secondary | ICD-10-CM | POA: Diagnosis not present

## 2015-10-11 DIAGNOSIS — Z23 Encounter for immunization: Secondary | ICD-10-CM | POA: Diagnosis not present

## 2015-10-11 DIAGNOSIS — N2581 Secondary hyperparathyroidism of renal origin: Secondary | ICD-10-CM | POA: Diagnosis not present

## 2015-10-11 DIAGNOSIS — N186 End stage renal disease: Secondary | ICD-10-CM | POA: Diagnosis not present

## 2015-10-11 DIAGNOSIS — D631 Anemia in chronic kidney disease: Secondary | ICD-10-CM | POA: Diagnosis not present

## 2015-10-14 DIAGNOSIS — D631 Anemia in chronic kidney disease: Secondary | ICD-10-CM | POA: Diagnosis not present

## 2015-10-14 DIAGNOSIS — N2581 Secondary hyperparathyroidism of renal origin: Secondary | ICD-10-CM | POA: Diagnosis not present

## 2015-10-14 DIAGNOSIS — N186 End stage renal disease: Secondary | ICD-10-CM | POA: Diagnosis not present

## 2015-10-14 DIAGNOSIS — Z23 Encounter for immunization: Secondary | ICD-10-CM | POA: Diagnosis not present

## 2015-10-14 DIAGNOSIS — N184 Chronic kidney disease, stage 4 (severe): Secondary | ICD-10-CM | POA: Diagnosis not present

## 2015-10-16 DIAGNOSIS — N2581 Secondary hyperparathyroidism of renal origin: Secondary | ICD-10-CM | POA: Diagnosis not present

## 2015-10-16 DIAGNOSIS — N186 End stage renal disease: Secondary | ICD-10-CM | POA: Diagnosis not present

## 2015-10-16 DIAGNOSIS — Z23 Encounter for immunization: Secondary | ICD-10-CM | POA: Diagnosis not present

## 2015-10-16 DIAGNOSIS — D631 Anemia in chronic kidney disease: Secondary | ICD-10-CM | POA: Diagnosis not present

## 2015-10-16 DIAGNOSIS — N184 Chronic kidney disease, stage 4 (severe): Secondary | ICD-10-CM | POA: Diagnosis not present

## 2015-10-18 DIAGNOSIS — Z23 Encounter for immunization: Secondary | ICD-10-CM | POA: Diagnosis not present

## 2015-10-18 DIAGNOSIS — D631 Anemia in chronic kidney disease: Secondary | ICD-10-CM | POA: Diagnosis not present

## 2015-10-18 DIAGNOSIS — N184 Chronic kidney disease, stage 4 (severe): Secondary | ICD-10-CM | POA: Diagnosis not present

## 2015-10-18 DIAGNOSIS — N186 End stage renal disease: Secondary | ICD-10-CM | POA: Diagnosis not present

## 2015-10-18 DIAGNOSIS — N2581 Secondary hyperparathyroidism of renal origin: Secondary | ICD-10-CM | POA: Diagnosis not present

## 2015-10-21 DIAGNOSIS — N184 Chronic kidney disease, stage 4 (severe): Secondary | ICD-10-CM | POA: Diagnosis not present

## 2015-10-21 DIAGNOSIS — N2581 Secondary hyperparathyroidism of renal origin: Secondary | ICD-10-CM | POA: Diagnosis not present

## 2015-10-21 DIAGNOSIS — Z23 Encounter for immunization: Secondary | ICD-10-CM | POA: Diagnosis not present

## 2015-10-21 DIAGNOSIS — N186 End stage renal disease: Secondary | ICD-10-CM | POA: Diagnosis not present

## 2015-10-21 DIAGNOSIS — D631 Anemia in chronic kidney disease: Secondary | ICD-10-CM | POA: Diagnosis not present

## 2015-10-23 DIAGNOSIS — Z23 Encounter for immunization: Secondary | ICD-10-CM | POA: Diagnosis not present

## 2015-10-23 DIAGNOSIS — N184 Chronic kidney disease, stage 4 (severe): Secondary | ICD-10-CM | POA: Diagnosis not present

## 2015-10-23 DIAGNOSIS — D631 Anemia in chronic kidney disease: Secondary | ICD-10-CM | POA: Diagnosis not present

## 2015-10-23 DIAGNOSIS — N2581 Secondary hyperparathyroidism of renal origin: Secondary | ICD-10-CM | POA: Diagnosis not present

## 2015-10-23 DIAGNOSIS — N186 End stage renal disease: Secondary | ICD-10-CM | POA: Diagnosis not present

## 2015-10-24 DIAGNOSIS — Z992 Dependence on renal dialysis: Secondary | ICD-10-CM | POA: Diagnosis not present

## 2015-10-24 DIAGNOSIS — T82858D Stenosis of vascular prosthetic devices, implants and grafts, subsequent encounter: Secondary | ICD-10-CM | POA: Diagnosis not present

## 2015-10-24 DIAGNOSIS — I871 Compression of vein: Secondary | ICD-10-CM | POA: Diagnosis not present

## 2015-10-24 DIAGNOSIS — N186 End stage renal disease: Secondary | ICD-10-CM | POA: Diagnosis not present

## 2015-10-25 DIAGNOSIS — D631 Anemia in chronic kidney disease: Secondary | ICD-10-CM | POA: Diagnosis not present

## 2015-10-25 DIAGNOSIS — N2581 Secondary hyperparathyroidism of renal origin: Secondary | ICD-10-CM | POA: Diagnosis not present

## 2015-10-25 DIAGNOSIS — N186 End stage renal disease: Secondary | ICD-10-CM | POA: Diagnosis not present

## 2015-10-25 DIAGNOSIS — N184 Chronic kidney disease, stage 4 (severe): Secondary | ICD-10-CM | POA: Diagnosis not present

## 2015-10-25 DIAGNOSIS — Z23 Encounter for immunization: Secondary | ICD-10-CM | POA: Diagnosis not present

## 2015-10-28 DIAGNOSIS — N184 Chronic kidney disease, stage 4 (severe): Secondary | ICD-10-CM | POA: Diagnosis not present

## 2015-10-28 DIAGNOSIS — Z23 Encounter for immunization: Secondary | ICD-10-CM | POA: Diagnosis not present

## 2015-10-28 DIAGNOSIS — N186 End stage renal disease: Secondary | ICD-10-CM | POA: Diagnosis not present

## 2015-10-28 DIAGNOSIS — N2581 Secondary hyperparathyroidism of renal origin: Secondary | ICD-10-CM | POA: Diagnosis not present

## 2015-10-28 DIAGNOSIS — D631 Anemia in chronic kidney disease: Secondary | ICD-10-CM | POA: Diagnosis not present

## 2015-10-29 DIAGNOSIS — L309 Dermatitis, unspecified: Secondary | ICD-10-CM | POA: Diagnosis not present

## 2015-10-29 DIAGNOSIS — L0889 Other specified local infections of the skin and subcutaneous tissue: Secondary | ICD-10-CM | POA: Diagnosis not present

## 2015-10-30 DIAGNOSIS — D631 Anemia in chronic kidney disease: Secondary | ICD-10-CM | POA: Diagnosis not present

## 2015-10-30 DIAGNOSIS — N184 Chronic kidney disease, stage 4 (severe): Secondary | ICD-10-CM | POA: Diagnosis not present

## 2015-10-30 DIAGNOSIS — Z23 Encounter for immunization: Secondary | ICD-10-CM | POA: Diagnosis not present

## 2015-10-30 DIAGNOSIS — N186 End stage renal disease: Secondary | ICD-10-CM | POA: Diagnosis not present

## 2015-10-30 DIAGNOSIS — N2581 Secondary hyperparathyroidism of renal origin: Secondary | ICD-10-CM | POA: Diagnosis not present

## 2015-11-01 DIAGNOSIS — N186 End stage renal disease: Secondary | ICD-10-CM | POA: Diagnosis not present

## 2015-11-01 DIAGNOSIS — D631 Anemia in chronic kidney disease: Secondary | ICD-10-CM | POA: Diagnosis not present

## 2015-11-01 DIAGNOSIS — Z23 Encounter for immunization: Secondary | ICD-10-CM | POA: Diagnosis not present

## 2015-11-01 DIAGNOSIS — N184 Chronic kidney disease, stage 4 (severe): Secondary | ICD-10-CM | POA: Diagnosis not present

## 2015-11-01 DIAGNOSIS — N2581 Secondary hyperparathyroidism of renal origin: Secondary | ICD-10-CM | POA: Diagnosis not present

## 2015-11-04 DIAGNOSIS — D631 Anemia in chronic kidney disease: Secondary | ICD-10-CM | POA: Diagnosis not present

## 2015-11-04 DIAGNOSIS — N184 Chronic kidney disease, stage 4 (severe): Secondary | ICD-10-CM | POA: Diagnosis not present

## 2015-11-04 DIAGNOSIS — Z23 Encounter for immunization: Secondary | ICD-10-CM | POA: Diagnosis not present

## 2015-11-04 DIAGNOSIS — N2581 Secondary hyperparathyroidism of renal origin: Secondary | ICD-10-CM | POA: Diagnosis not present

## 2015-11-04 DIAGNOSIS — N186 End stage renal disease: Secondary | ICD-10-CM | POA: Diagnosis not present

## 2015-11-04 DIAGNOSIS — Z992 Dependence on renal dialysis: Secondary | ICD-10-CM | POA: Diagnosis not present

## 2015-11-04 DIAGNOSIS — N2889 Other specified disorders of kidney and ureter: Secondary | ICD-10-CM | POA: Diagnosis not present

## 2015-11-06 DIAGNOSIS — N184 Chronic kidney disease, stage 4 (severe): Secondary | ICD-10-CM | POA: Diagnosis not present

## 2015-11-06 DIAGNOSIS — N186 End stage renal disease: Secondary | ICD-10-CM | POA: Diagnosis not present

## 2015-11-06 DIAGNOSIS — Z23 Encounter for immunization: Secondary | ICD-10-CM | POA: Diagnosis not present

## 2015-11-06 DIAGNOSIS — N2581 Secondary hyperparathyroidism of renal origin: Secondary | ICD-10-CM | POA: Diagnosis not present

## 2015-11-06 DIAGNOSIS — D631 Anemia in chronic kidney disease: Secondary | ICD-10-CM | POA: Diagnosis not present

## 2015-11-08 DIAGNOSIS — D631 Anemia in chronic kidney disease: Secondary | ICD-10-CM | POA: Diagnosis not present

## 2015-11-08 DIAGNOSIS — Z23 Encounter for immunization: Secondary | ICD-10-CM | POA: Diagnosis not present

## 2015-11-08 DIAGNOSIS — N186 End stage renal disease: Secondary | ICD-10-CM | POA: Diagnosis not present

## 2015-11-08 DIAGNOSIS — N2581 Secondary hyperparathyroidism of renal origin: Secondary | ICD-10-CM | POA: Diagnosis not present

## 2015-11-08 DIAGNOSIS — N184 Chronic kidney disease, stage 4 (severe): Secondary | ICD-10-CM | POA: Diagnosis not present

## 2015-11-11 DIAGNOSIS — N186 End stage renal disease: Secondary | ICD-10-CM | POA: Diagnosis not present

## 2015-11-11 DIAGNOSIS — N184 Chronic kidney disease, stage 4 (severe): Secondary | ICD-10-CM | POA: Diagnosis not present

## 2015-11-11 DIAGNOSIS — D631 Anemia in chronic kidney disease: Secondary | ICD-10-CM | POA: Diagnosis not present

## 2015-11-11 DIAGNOSIS — N2581 Secondary hyperparathyroidism of renal origin: Secondary | ICD-10-CM | POA: Diagnosis not present

## 2015-11-11 DIAGNOSIS — Z23 Encounter for immunization: Secondary | ICD-10-CM | POA: Diagnosis not present

## 2015-11-13 DIAGNOSIS — N186 End stage renal disease: Secondary | ICD-10-CM | POA: Diagnosis not present

## 2015-11-13 DIAGNOSIS — D631 Anemia in chronic kidney disease: Secondary | ICD-10-CM | POA: Diagnosis not present

## 2015-11-13 DIAGNOSIS — N184 Chronic kidney disease, stage 4 (severe): Secondary | ICD-10-CM | POA: Diagnosis not present

## 2015-11-13 DIAGNOSIS — N2581 Secondary hyperparathyroidism of renal origin: Secondary | ICD-10-CM | POA: Diagnosis not present

## 2015-11-13 DIAGNOSIS — Z23 Encounter for immunization: Secondary | ICD-10-CM | POA: Diagnosis not present

## 2015-11-15 DIAGNOSIS — Z23 Encounter for immunization: Secondary | ICD-10-CM | POA: Diagnosis not present

## 2015-11-15 DIAGNOSIS — D631 Anemia in chronic kidney disease: Secondary | ICD-10-CM | POA: Diagnosis not present

## 2015-11-15 DIAGNOSIS — N184 Chronic kidney disease, stage 4 (severe): Secondary | ICD-10-CM | POA: Diagnosis not present

## 2015-11-15 DIAGNOSIS — N186 End stage renal disease: Secondary | ICD-10-CM | POA: Diagnosis not present

## 2015-11-15 DIAGNOSIS — N2581 Secondary hyperparathyroidism of renal origin: Secondary | ICD-10-CM | POA: Diagnosis not present

## 2015-11-18 DIAGNOSIS — N186 End stage renal disease: Secondary | ICD-10-CM | POA: Diagnosis not present

## 2015-11-18 DIAGNOSIS — N184 Chronic kidney disease, stage 4 (severe): Secondary | ICD-10-CM | POA: Diagnosis not present

## 2015-11-18 DIAGNOSIS — Z23 Encounter for immunization: Secondary | ICD-10-CM | POA: Diagnosis not present

## 2015-11-18 DIAGNOSIS — D631 Anemia in chronic kidney disease: Secondary | ICD-10-CM | POA: Diagnosis not present

## 2015-11-18 DIAGNOSIS — N2581 Secondary hyperparathyroidism of renal origin: Secondary | ICD-10-CM | POA: Diagnosis not present

## 2015-11-20 DIAGNOSIS — D631 Anemia in chronic kidney disease: Secondary | ICD-10-CM | POA: Diagnosis not present

## 2015-11-20 DIAGNOSIS — N184 Chronic kidney disease, stage 4 (severe): Secondary | ICD-10-CM | POA: Diagnosis not present

## 2015-11-20 DIAGNOSIS — Z23 Encounter for immunization: Secondary | ICD-10-CM | POA: Diagnosis not present

## 2015-11-20 DIAGNOSIS — N186 End stage renal disease: Secondary | ICD-10-CM | POA: Diagnosis not present

## 2015-11-20 DIAGNOSIS — N2581 Secondary hyperparathyroidism of renal origin: Secondary | ICD-10-CM | POA: Diagnosis not present

## 2015-11-22 DIAGNOSIS — N186 End stage renal disease: Secondary | ICD-10-CM | POA: Diagnosis not present

## 2015-11-22 DIAGNOSIS — N2581 Secondary hyperparathyroidism of renal origin: Secondary | ICD-10-CM | POA: Diagnosis not present

## 2015-11-22 DIAGNOSIS — D631 Anemia in chronic kidney disease: Secondary | ICD-10-CM | POA: Diagnosis not present

## 2015-11-22 DIAGNOSIS — Z23 Encounter for immunization: Secondary | ICD-10-CM | POA: Diagnosis not present

## 2015-11-22 DIAGNOSIS — N184 Chronic kidney disease, stage 4 (severe): Secondary | ICD-10-CM | POA: Diagnosis not present

## 2015-11-24 DIAGNOSIS — N2581 Secondary hyperparathyroidism of renal origin: Secondary | ICD-10-CM | POA: Diagnosis not present

## 2015-11-24 DIAGNOSIS — D631 Anemia in chronic kidney disease: Secondary | ICD-10-CM | POA: Diagnosis not present

## 2015-11-24 DIAGNOSIS — N184 Chronic kidney disease, stage 4 (severe): Secondary | ICD-10-CM | POA: Diagnosis not present

## 2015-11-24 DIAGNOSIS — Z23 Encounter for immunization: Secondary | ICD-10-CM | POA: Diagnosis not present

## 2015-11-24 DIAGNOSIS — N186 End stage renal disease: Secondary | ICD-10-CM | POA: Diagnosis not present

## 2015-11-26 DIAGNOSIS — N186 End stage renal disease: Secondary | ICD-10-CM | POA: Diagnosis not present

## 2015-11-26 DIAGNOSIS — N2581 Secondary hyperparathyroidism of renal origin: Secondary | ICD-10-CM | POA: Diagnosis not present

## 2015-11-26 DIAGNOSIS — Z23 Encounter for immunization: Secondary | ICD-10-CM | POA: Diagnosis not present

## 2015-11-26 DIAGNOSIS — D631 Anemia in chronic kidney disease: Secondary | ICD-10-CM | POA: Diagnosis not present

## 2015-11-26 DIAGNOSIS — N184 Chronic kidney disease, stage 4 (severe): Secondary | ICD-10-CM | POA: Diagnosis not present

## 2015-11-29 DIAGNOSIS — N184 Chronic kidney disease, stage 4 (severe): Secondary | ICD-10-CM | POA: Diagnosis not present

## 2015-11-29 DIAGNOSIS — N2581 Secondary hyperparathyroidism of renal origin: Secondary | ICD-10-CM | POA: Diagnosis not present

## 2015-11-29 DIAGNOSIS — D631 Anemia in chronic kidney disease: Secondary | ICD-10-CM | POA: Diagnosis not present

## 2015-11-29 DIAGNOSIS — N186 End stage renal disease: Secondary | ICD-10-CM | POA: Diagnosis not present

## 2015-11-29 DIAGNOSIS — Z23 Encounter for immunization: Secondary | ICD-10-CM | POA: Diagnosis not present

## 2015-12-02 DIAGNOSIS — D631 Anemia in chronic kidney disease: Secondary | ICD-10-CM | POA: Diagnosis not present

## 2015-12-02 DIAGNOSIS — N2581 Secondary hyperparathyroidism of renal origin: Secondary | ICD-10-CM | POA: Diagnosis not present

## 2015-12-02 DIAGNOSIS — N184 Chronic kidney disease, stage 4 (severe): Secondary | ICD-10-CM | POA: Diagnosis not present

## 2015-12-02 DIAGNOSIS — N186 End stage renal disease: Secondary | ICD-10-CM | POA: Diagnosis not present

## 2015-12-02 DIAGNOSIS — Z23 Encounter for immunization: Secondary | ICD-10-CM | POA: Diagnosis not present

## 2015-12-04 DIAGNOSIS — N2889 Other specified disorders of kidney and ureter: Secondary | ICD-10-CM | POA: Diagnosis not present

## 2015-12-04 DIAGNOSIS — N186 End stage renal disease: Secondary | ICD-10-CM | POA: Diagnosis not present

## 2015-12-04 DIAGNOSIS — Z992 Dependence on renal dialysis: Secondary | ICD-10-CM | POA: Diagnosis not present

## 2015-12-04 DIAGNOSIS — Z23 Encounter for immunization: Secondary | ICD-10-CM | POA: Diagnosis not present

## 2015-12-04 DIAGNOSIS — D631 Anemia in chronic kidney disease: Secondary | ICD-10-CM | POA: Diagnosis not present

## 2015-12-04 DIAGNOSIS — N184 Chronic kidney disease, stage 4 (severe): Secondary | ICD-10-CM | POA: Diagnosis not present

## 2015-12-04 DIAGNOSIS — N2581 Secondary hyperparathyroidism of renal origin: Secondary | ICD-10-CM | POA: Diagnosis not present

## 2015-12-06 DIAGNOSIS — N184 Chronic kidney disease, stage 4 (severe): Secondary | ICD-10-CM | POA: Diagnosis not present

## 2015-12-06 DIAGNOSIS — N2581 Secondary hyperparathyroidism of renal origin: Secondary | ICD-10-CM | POA: Diagnosis not present

## 2015-12-06 DIAGNOSIS — N186 End stage renal disease: Secondary | ICD-10-CM | POA: Diagnosis not present

## 2015-12-06 DIAGNOSIS — D631 Anemia in chronic kidney disease: Secondary | ICD-10-CM | POA: Diagnosis not present

## 2015-12-09 DIAGNOSIS — N2581 Secondary hyperparathyroidism of renal origin: Secondary | ICD-10-CM | POA: Diagnosis not present

## 2015-12-09 DIAGNOSIS — N186 End stage renal disease: Secondary | ICD-10-CM | POA: Diagnosis not present

## 2015-12-09 DIAGNOSIS — N184 Chronic kidney disease, stage 4 (severe): Secondary | ICD-10-CM | POA: Diagnosis not present

## 2015-12-09 DIAGNOSIS — D631 Anemia in chronic kidney disease: Secondary | ICD-10-CM | POA: Diagnosis not present

## 2015-12-11 DIAGNOSIS — N184 Chronic kidney disease, stage 4 (severe): Secondary | ICD-10-CM | POA: Diagnosis not present

## 2015-12-11 DIAGNOSIS — N2581 Secondary hyperparathyroidism of renal origin: Secondary | ICD-10-CM | POA: Diagnosis not present

## 2015-12-11 DIAGNOSIS — D631 Anemia in chronic kidney disease: Secondary | ICD-10-CM | POA: Diagnosis not present

## 2015-12-11 DIAGNOSIS — N186 End stage renal disease: Secondary | ICD-10-CM | POA: Diagnosis not present

## 2015-12-13 DIAGNOSIS — N186 End stage renal disease: Secondary | ICD-10-CM | POA: Diagnosis not present

## 2015-12-13 DIAGNOSIS — D631 Anemia in chronic kidney disease: Secondary | ICD-10-CM | POA: Diagnosis not present

## 2015-12-13 DIAGNOSIS — N2581 Secondary hyperparathyroidism of renal origin: Secondary | ICD-10-CM | POA: Diagnosis not present

## 2015-12-13 DIAGNOSIS — N184 Chronic kidney disease, stage 4 (severe): Secondary | ICD-10-CM | POA: Diagnosis not present

## 2015-12-16 DIAGNOSIS — D631 Anemia in chronic kidney disease: Secondary | ICD-10-CM | POA: Diagnosis not present

## 2015-12-16 DIAGNOSIS — N184 Chronic kidney disease, stage 4 (severe): Secondary | ICD-10-CM | POA: Diagnosis not present

## 2015-12-16 DIAGNOSIS — N2581 Secondary hyperparathyroidism of renal origin: Secondary | ICD-10-CM | POA: Diagnosis not present

## 2015-12-16 DIAGNOSIS — N186 End stage renal disease: Secondary | ICD-10-CM | POA: Diagnosis not present

## 2015-12-18 DIAGNOSIS — N186 End stage renal disease: Secondary | ICD-10-CM | POA: Diagnosis not present

## 2015-12-18 DIAGNOSIS — N184 Chronic kidney disease, stage 4 (severe): Secondary | ICD-10-CM | POA: Diagnosis not present

## 2015-12-18 DIAGNOSIS — N2581 Secondary hyperparathyroidism of renal origin: Secondary | ICD-10-CM | POA: Diagnosis not present

## 2015-12-18 DIAGNOSIS — D631 Anemia in chronic kidney disease: Secondary | ICD-10-CM | POA: Diagnosis not present

## 2015-12-20 DIAGNOSIS — D631 Anemia in chronic kidney disease: Secondary | ICD-10-CM | POA: Diagnosis not present

## 2015-12-20 DIAGNOSIS — N186 End stage renal disease: Secondary | ICD-10-CM | POA: Diagnosis not present

## 2015-12-20 DIAGNOSIS — N2581 Secondary hyperparathyroidism of renal origin: Secondary | ICD-10-CM | POA: Diagnosis not present

## 2015-12-20 DIAGNOSIS — N184 Chronic kidney disease, stage 4 (severe): Secondary | ICD-10-CM | POA: Diagnosis not present

## 2015-12-23 DIAGNOSIS — N2581 Secondary hyperparathyroidism of renal origin: Secondary | ICD-10-CM | POA: Diagnosis not present

## 2015-12-23 DIAGNOSIS — D631 Anemia in chronic kidney disease: Secondary | ICD-10-CM | POA: Diagnosis not present

## 2015-12-23 DIAGNOSIS — N184 Chronic kidney disease, stage 4 (severe): Secondary | ICD-10-CM | POA: Diagnosis not present

## 2015-12-23 DIAGNOSIS — N186 End stage renal disease: Secondary | ICD-10-CM | POA: Diagnosis not present

## 2015-12-25 DIAGNOSIS — D631 Anemia in chronic kidney disease: Secondary | ICD-10-CM | POA: Diagnosis not present

## 2015-12-25 DIAGNOSIS — N2581 Secondary hyperparathyroidism of renal origin: Secondary | ICD-10-CM | POA: Diagnosis not present

## 2015-12-25 DIAGNOSIS — N184 Chronic kidney disease, stage 4 (severe): Secondary | ICD-10-CM | POA: Diagnosis not present

## 2015-12-25 DIAGNOSIS — N186 End stage renal disease: Secondary | ICD-10-CM | POA: Diagnosis not present

## 2015-12-27 DIAGNOSIS — N186 End stage renal disease: Secondary | ICD-10-CM | POA: Diagnosis not present

## 2015-12-27 DIAGNOSIS — N184 Chronic kidney disease, stage 4 (severe): Secondary | ICD-10-CM | POA: Diagnosis not present

## 2015-12-27 DIAGNOSIS — N2581 Secondary hyperparathyroidism of renal origin: Secondary | ICD-10-CM | POA: Diagnosis not present

## 2015-12-27 DIAGNOSIS — D631 Anemia in chronic kidney disease: Secondary | ICD-10-CM | POA: Diagnosis not present

## 2015-12-30 DIAGNOSIS — N186 End stage renal disease: Secondary | ICD-10-CM | POA: Diagnosis not present

## 2015-12-30 DIAGNOSIS — N2581 Secondary hyperparathyroidism of renal origin: Secondary | ICD-10-CM | POA: Diagnosis not present

## 2015-12-30 DIAGNOSIS — D631 Anemia in chronic kidney disease: Secondary | ICD-10-CM | POA: Diagnosis not present

## 2015-12-30 DIAGNOSIS — N184 Chronic kidney disease, stage 4 (severe): Secondary | ICD-10-CM | POA: Diagnosis not present

## 2016-01-01 DIAGNOSIS — N186 End stage renal disease: Secondary | ICD-10-CM | POA: Diagnosis not present

## 2016-01-01 DIAGNOSIS — N2581 Secondary hyperparathyroidism of renal origin: Secondary | ICD-10-CM | POA: Diagnosis not present

## 2016-01-01 DIAGNOSIS — N184 Chronic kidney disease, stage 4 (severe): Secondary | ICD-10-CM | POA: Diagnosis not present

## 2016-01-01 DIAGNOSIS — D631 Anemia in chronic kidney disease: Secondary | ICD-10-CM | POA: Diagnosis not present

## 2016-01-03 DIAGNOSIS — N184 Chronic kidney disease, stage 4 (severe): Secondary | ICD-10-CM | POA: Diagnosis not present

## 2016-01-03 DIAGNOSIS — N186 End stage renal disease: Secondary | ICD-10-CM | POA: Diagnosis not present

## 2016-01-03 DIAGNOSIS — D631 Anemia in chronic kidney disease: Secondary | ICD-10-CM | POA: Diagnosis not present

## 2016-01-03 DIAGNOSIS — N2581 Secondary hyperparathyroidism of renal origin: Secondary | ICD-10-CM | POA: Diagnosis not present

## 2016-01-04 DIAGNOSIS — Z992 Dependence on renal dialysis: Secondary | ICD-10-CM | POA: Diagnosis not present

## 2016-01-04 DIAGNOSIS — N186 End stage renal disease: Secondary | ICD-10-CM | POA: Diagnosis not present

## 2016-01-04 DIAGNOSIS — N2889 Other specified disorders of kidney and ureter: Secondary | ICD-10-CM | POA: Diagnosis not present

## 2016-01-06 DIAGNOSIS — N186 End stage renal disease: Secondary | ICD-10-CM | POA: Diagnosis not present

## 2016-01-06 DIAGNOSIS — N2581 Secondary hyperparathyroidism of renal origin: Secondary | ICD-10-CM | POA: Diagnosis not present

## 2016-01-06 DIAGNOSIS — D631 Anemia in chronic kidney disease: Secondary | ICD-10-CM | POA: Diagnosis not present

## 2016-01-06 DIAGNOSIS — E875 Hyperkalemia: Secondary | ICD-10-CM | POA: Diagnosis not present

## 2016-01-06 DIAGNOSIS — N184 Chronic kidney disease, stage 4 (severe): Secondary | ICD-10-CM | POA: Diagnosis not present

## 2016-01-08 DIAGNOSIS — D631 Anemia in chronic kidney disease: Secondary | ICD-10-CM | POA: Diagnosis not present

## 2016-01-08 DIAGNOSIS — E875 Hyperkalemia: Secondary | ICD-10-CM | POA: Diagnosis not present

## 2016-01-08 DIAGNOSIS — N2581 Secondary hyperparathyroidism of renal origin: Secondary | ICD-10-CM | POA: Diagnosis not present

## 2016-01-08 DIAGNOSIS — N186 End stage renal disease: Secondary | ICD-10-CM | POA: Diagnosis not present

## 2016-01-08 DIAGNOSIS — N184 Chronic kidney disease, stage 4 (severe): Secondary | ICD-10-CM | POA: Diagnosis not present

## 2016-01-10 DIAGNOSIS — N2581 Secondary hyperparathyroidism of renal origin: Secondary | ICD-10-CM | POA: Diagnosis not present

## 2016-01-10 DIAGNOSIS — E875 Hyperkalemia: Secondary | ICD-10-CM | POA: Diagnosis not present

## 2016-01-10 DIAGNOSIS — N186 End stage renal disease: Secondary | ICD-10-CM | POA: Diagnosis not present

## 2016-01-10 DIAGNOSIS — D631 Anemia in chronic kidney disease: Secondary | ICD-10-CM | POA: Diagnosis not present

## 2016-01-10 DIAGNOSIS — N184 Chronic kidney disease, stage 4 (severe): Secondary | ICD-10-CM | POA: Diagnosis not present

## 2016-01-13 DIAGNOSIS — N2581 Secondary hyperparathyroidism of renal origin: Secondary | ICD-10-CM | POA: Diagnosis not present

## 2016-01-13 DIAGNOSIS — N184 Chronic kidney disease, stage 4 (severe): Secondary | ICD-10-CM | POA: Diagnosis not present

## 2016-01-13 DIAGNOSIS — D631 Anemia in chronic kidney disease: Secondary | ICD-10-CM | POA: Diagnosis not present

## 2016-01-13 DIAGNOSIS — N186 End stage renal disease: Secondary | ICD-10-CM | POA: Diagnosis not present

## 2016-01-13 DIAGNOSIS — E875 Hyperkalemia: Secondary | ICD-10-CM | POA: Diagnosis not present

## 2016-01-15 DIAGNOSIS — N2581 Secondary hyperparathyroidism of renal origin: Secondary | ICD-10-CM | POA: Diagnosis not present

## 2016-01-15 DIAGNOSIS — N186 End stage renal disease: Secondary | ICD-10-CM | POA: Diagnosis not present

## 2016-01-15 DIAGNOSIS — N184 Chronic kidney disease, stage 4 (severe): Secondary | ICD-10-CM | POA: Diagnosis not present

## 2016-01-15 DIAGNOSIS — D631 Anemia in chronic kidney disease: Secondary | ICD-10-CM | POA: Diagnosis not present

## 2016-01-15 DIAGNOSIS — E875 Hyperkalemia: Secondary | ICD-10-CM | POA: Diagnosis not present

## 2016-01-17 DIAGNOSIS — N2581 Secondary hyperparathyroidism of renal origin: Secondary | ICD-10-CM | POA: Diagnosis not present

## 2016-01-17 DIAGNOSIS — D631 Anemia in chronic kidney disease: Secondary | ICD-10-CM | POA: Diagnosis not present

## 2016-01-17 DIAGNOSIS — E875 Hyperkalemia: Secondary | ICD-10-CM | POA: Diagnosis not present

## 2016-01-17 DIAGNOSIS — N184 Chronic kidney disease, stage 4 (severe): Secondary | ICD-10-CM | POA: Diagnosis not present

## 2016-01-17 DIAGNOSIS — N186 End stage renal disease: Secondary | ICD-10-CM | POA: Diagnosis not present

## 2016-01-20 DIAGNOSIS — N184 Chronic kidney disease, stage 4 (severe): Secondary | ICD-10-CM | POA: Diagnosis not present

## 2016-01-20 DIAGNOSIS — N2581 Secondary hyperparathyroidism of renal origin: Secondary | ICD-10-CM | POA: Diagnosis not present

## 2016-01-20 DIAGNOSIS — D631 Anemia in chronic kidney disease: Secondary | ICD-10-CM | POA: Diagnosis not present

## 2016-01-20 DIAGNOSIS — N186 End stage renal disease: Secondary | ICD-10-CM | POA: Diagnosis not present

## 2016-01-20 DIAGNOSIS — E875 Hyperkalemia: Secondary | ICD-10-CM | POA: Diagnosis not present

## 2016-01-22 DIAGNOSIS — N184 Chronic kidney disease, stage 4 (severe): Secondary | ICD-10-CM | POA: Diagnosis not present

## 2016-01-22 DIAGNOSIS — E875 Hyperkalemia: Secondary | ICD-10-CM | POA: Diagnosis not present

## 2016-01-22 DIAGNOSIS — D631 Anemia in chronic kidney disease: Secondary | ICD-10-CM | POA: Diagnosis not present

## 2016-01-22 DIAGNOSIS — N2581 Secondary hyperparathyroidism of renal origin: Secondary | ICD-10-CM | POA: Diagnosis not present

## 2016-01-22 DIAGNOSIS — N186 End stage renal disease: Secondary | ICD-10-CM | POA: Diagnosis not present

## 2016-01-23 ENCOUNTER — Other Ambulatory Visit: Payer: Self-pay | Admitting: Internal Medicine

## 2016-01-24 DIAGNOSIS — D631 Anemia in chronic kidney disease: Secondary | ICD-10-CM | POA: Diagnosis not present

## 2016-01-24 DIAGNOSIS — N186 End stage renal disease: Secondary | ICD-10-CM | POA: Diagnosis not present

## 2016-01-24 DIAGNOSIS — E875 Hyperkalemia: Secondary | ICD-10-CM | POA: Diagnosis not present

## 2016-01-24 DIAGNOSIS — N2581 Secondary hyperparathyroidism of renal origin: Secondary | ICD-10-CM | POA: Diagnosis not present

## 2016-01-24 DIAGNOSIS — N184 Chronic kidney disease, stage 4 (severe): Secondary | ICD-10-CM | POA: Diagnosis not present

## 2016-01-27 DIAGNOSIS — D631 Anemia in chronic kidney disease: Secondary | ICD-10-CM | POA: Diagnosis not present

## 2016-01-27 DIAGNOSIS — N184 Chronic kidney disease, stage 4 (severe): Secondary | ICD-10-CM | POA: Diagnosis not present

## 2016-01-27 DIAGNOSIS — N186 End stage renal disease: Secondary | ICD-10-CM | POA: Diagnosis not present

## 2016-01-27 DIAGNOSIS — N2581 Secondary hyperparathyroidism of renal origin: Secondary | ICD-10-CM | POA: Diagnosis not present

## 2016-01-27 DIAGNOSIS — E875 Hyperkalemia: Secondary | ICD-10-CM | POA: Diagnosis not present

## 2016-01-29 DIAGNOSIS — N186 End stage renal disease: Secondary | ICD-10-CM | POA: Diagnosis not present

## 2016-01-29 DIAGNOSIS — N184 Chronic kidney disease, stage 4 (severe): Secondary | ICD-10-CM | POA: Diagnosis not present

## 2016-01-29 DIAGNOSIS — N2581 Secondary hyperparathyroidism of renal origin: Secondary | ICD-10-CM | POA: Diagnosis not present

## 2016-01-29 DIAGNOSIS — D631 Anemia in chronic kidney disease: Secondary | ICD-10-CM | POA: Diagnosis not present

## 2016-01-29 DIAGNOSIS — E875 Hyperkalemia: Secondary | ICD-10-CM | POA: Diagnosis not present

## 2016-01-31 DIAGNOSIS — N2581 Secondary hyperparathyroidism of renal origin: Secondary | ICD-10-CM | POA: Diagnosis not present

## 2016-01-31 DIAGNOSIS — D631 Anemia in chronic kidney disease: Secondary | ICD-10-CM | POA: Diagnosis not present

## 2016-01-31 DIAGNOSIS — N186 End stage renal disease: Secondary | ICD-10-CM | POA: Diagnosis not present

## 2016-01-31 DIAGNOSIS — E875 Hyperkalemia: Secondary | ICD-10-CM | POA: Diagnosis not present

## 2016-01-31 DIAGNOSIS — N184 Chronic kidney disease, stage 4 (severe): Secondary | ICD-10-CM | POA: Diagnosis not present

## 2016-02-03 DIAGNOSIS — N186 End stage renal disease: Secondary | ICD-10-CM | POA: Diagnosis not present

## 2016-02-03 DIAGNOSIS — D631 Anemia in chronic kidney disease: Secondary | ICD-10-CM | POA: Diagnosis not present

## 2016-02-03 DIAGNOSIS — N2581 Secondary hyperparathyroidism of renal origin: Secondary | ICD-10-CM | POA: Diagnosis not present

## 2016-02-03 DIAGNOSIS — E875 Hyperkalemia: Secondary | ICD-10-CM | POA: Diagnosis not present

## 2016-02-03 DIAGNOSIS — N184 Chronic kidney disease, stage 4 (severe): Secondary | ICD-10-CM | POA: Diagnosis not present

## 2016-02-04 DIAGNOSIS — N2889 Other specified disorders of kidney and ureter: Secondary | ICD-10-CM | POA: Diagnosis not present

## 2016-02-04 DIAGNOSIS — Z992 Dependence on renal dialysis: Secondary | ICD-10-CM | POA: Diagnosis not present

## 2016-02-04 DIAGNOSIS — N186 End stage renal disease: Secondary | ICD-10-CM | POA: Diagnosis not present

## 2016-02-05 DIAGNOSIS — D631 Anemia in chronic kidney disease: Secondary | ICD-10-CM | POA: Diagnosis not present

## 2016-02-05 DIAGNOSIS — N186 End stage renal disease: Secondary | ICD-10-CM | POA: Diagnosis not present

## 2016-02-05 DIAGNOSIS — N2581 Secondary hyperparathyroidism of renal origin: Secondary | ICD-10-CM | POA: Diagnosis not present

## 2016-02-05 DIAGNOSIS — N184 Chronic kidney disease, stage 4 (severe): Secondary | ICD-10-CM | POA: Diagnosis not present

## 2016-02-07 DIAGNOSIS — D631 Anemia in chronic kidney disease: Secondary | ICD-10-CM | POA: Diagnosis not present

## 2016-02-07 DIAGNOSIS — N186 End stage renal disease: Secondary | ICD-10-CM | POA: Diagnosis not present

## 2016-02-07 DIAGNOSIS — N184 Chronic kidney disease, stage 4 (severe): Secondary | ICD-10-CM | POA: Diagnosis not present

## 2016-02-07 DIAGNOSIS — N2581 Secondary hyperparathyroidism of renal origin: Secondary | ICD-10-CM | POA: Diagnosis not present

## 2016-02-10 DIAGNOSIS — N2581 Secondary hyperparathyroidism of renal origin: Secondary | ICD-10-CM | POA: Diagnosis not present

## 2016-02-10 DIAGNOSIS — N184 Chronic kidney disease, stage 4 (severe): Secondary | ICD-10-CM | POA: Diagnosis not present

## 2016-02-10 DIAGNOSIS — N186 End stage renal disease: Secondary | ICD-10-CM | POA: Diagnosis not present

## 2016-02-10 DIAGNOSIS — D631 Anemia in chronic kidney disease: Secondary | ICD-10-CM | POA: Diagnosis not present

## 2016-02-12 DIAGNOSIS — N184 Chronic kidney disease, stage 4 (severe): Secondary | ICD-10-CM | POA: Diagnosis not present

## 2016-02-12 DIAGNOSIS — N2581 Secondary hyperparathyroidism of renal origin: Secondary | ICD-10-CM | POA: Diagnosis not present

## 2016-02-12 DIAGNOSIS — N186 End stage renal disease: Secondary | ICD-10-CM | POA: Diagnosis not present

## 2016-02-12 DIAGNOSIS — D631 Anemia in chronic kidney disease: Secondary | ICD-10-CM | POA: Diagnosis not present

## 2016-02-14 DIAGNOSIS — D631 Anemia in chronic kidney disease: Secondary | ICD-10-CM | POA: Diagnosis not present

## 2016-02-14 DIAGNOSIS — N184 Chronic kidney disease, stage 4 (severe): Secondary | ICD-10-CM | POA: Diagnosis not present

## 2016-02-14 DIAGNOSIS — N186 End stage renal disease: Secondary | ICD-10-CM | POA: Diagnosis not present

## 2016-02-14 DIAGNOSIS — N2581 Secondary hyperparathyroidism of renal origin: Secondary | ICD-10-CM | POA: Diagnosis not present

## 2016-02-17 DIAGNOSIS — N184 Chronic kidney disease, stage 4 (severe): Secondary | ICD-10-CM | POA: Diagnosis not present

## 2016-02-17 DIAGNOSIS — N186 End stage renal disease: Secondary | ICD-10-CM | POA: Diagnosis not present

## 2016-02-17 DIAGNOSIS — D631 Anemia in chronic kidney disease: Secondary | ICD-10-CM | POA: Diagnosis not present

## 2016-02-17 DIAGNOSIS — N2581 Secondary hyperparathyroidism of renal origin: Secondary | ICD-10-CM | POA: Diagnosis not present

## 2016-02-19 DIAGNOSIS — N186 End stage renal disease: Secondary | ICD-10-CM | POA: Diagnosis not present

## 2016-02-19 DIAGNOSIS — N2581 Secondary hyperparathyroidism of renal origin: Secondary | ICD-10-CM | POA: Diagnosis not present

## 2016-02-19 DIAGNOSIS — N184 Chronic kidney disease, stage 4 (severe): Secondary | ICD-10-CM | POA: Diagnosis not present

## 2016-02-19 DIAGNOSIS — D631 Anemia in chronic kidney disease: Secondary | ICD-10-CM | POA: Diagnosis not present

## 2016-02-21 DIAGNOSIS — D631 Anemia in chronic kidney disease: Secondary | ICD-10-CM | POA: Diagnosis not present

## 2016-02-21 DIAGNOSIS — N184 Chronic kidney disease, stage 4 (severe): Secondary | ICD-10-CM | POA: Diagnosis not present

## 2016-02-21 DIAGNOSIS — N186 End stage renal disease: Secondary | ICD-10-CM | POA: Diagnosis not present

## 2016-02-21 DIAGNOSIS — N2581 Secondary hyperparathyroidism of renal origin: Secondary | ICD-10-CM | POA: Diagnosis not present

## 2016-02-24 DIAGNOSIS — N184 Chronic kidney disease, stage 4 (severe): Secondary | ICD-10-CM | POA: Diagnosis not present

## 2016-02-24 DIAGNOSIS — N186 End stage renal disease: Secondary | ICD-10-CM | POA: Diagnosis not present

## 2016-02-24 DIAGNOSIS — N2581 Secondary hyperparathyroidism of renal origin: Secondary | ICD-10-CM | POA: Diagnosis not present

## 2016-02-24 DIAGNOSIS — D631 Anemia in chronic kidney disease: Secondary | ICD-10-CM | POA: Diagnosis not present

## 2016-02-26 DIAGNOSIS — D631 Anemia in chronic kidney disease: Secondary | ICD-10-CM | POA: Diagnosis not present

## 2016-02-26 DIAGNOSIS — N2581 Secondary hyperparathyroidism of renal origin: Secondary | ICD-10-CM | POA: Diagnosis not present

## 2016-02-26 DIAGNOSIS — N186 End stage renal disease: Secondary | ICD-10-CM | POA: Diagnosis not present

## 2016-02-26 DIAGNOSIS — N184 Chronic kidney disease, stage 4 (severe): Secondary | ICD-10-CM | POA: Diagnosis not present

## 2016-02-28 DIAGNOSIS — D631 Anemia in chronic kidney disease: Secondary | ICD-10-CM | POA: Diagnosis not present

## 2016-02-28 DIAGNOSIS — N184 Chronic kidney disease, stage 4 (severe): Secondary | ICD-10-CM | POA: Diagnosis not present

## 2016-02-28 DIAGNOSIS — N186 End stage renal disease: Secondary | ICD-10-CM | POA: Diagnosis not present

## 2016-02-28 DIAGNOSIS — N2581 Secondary hyperparathyroidism of renal origin: Secondary | ICD-10-CM | POA: Diagnosis not present

## 2016-03-02 DIAGNOSIS — N186 End stage renal disease: Secondary | ICD-10-CM | POA: Diagnosis not present

## 2016-03-02 DIAGNOSIS — N184 Chronic kidney disease, stage 4 (severe): Secondary | ICD-10-CM | POA: Diagnosis not present

## 2016-03-02 DIAGNOSIS — D631 Anemia in chronic kidney disease: Secondary | ICD-10-CM | POA: Diagnosis not present

## 2016-03-02 DIAGNOSIS — N2581 Secondary hyperparathyroidism of renal origin: Secondary | ICD-10-CM | POA: Diagnosis not present

## 2016-03-03 DIAGNOSIS — Z992 Dependence on renal dialysis: Secondary | ICD-10-CM | POA: Diagnosis not present

## 2016-03-03 DIAGNOSIS — N2889 Other specified disorders of kidney and ureter: Secondary | ICD-10-CM | POA: Diagnosis not present

## 2016-03-03 DIAGNOSIS — N186 End stage renal disease: Secondary | ICD-10-CM | POA: Diagnosis not present

## 2016-03-04 DIAGNOSIS — D631 Anemia in chronic kidney disease: Secondary | ICD-10-CM | POA: Diagnosis not present

## 2016-03-04 DIAGNOSIS — N184 Chronic kidney disease, stage 4 (severe): Secondary | ICD-10-CM | POA: Diagnosis not present

## 2016-03-04 DIAGNOSIS — N186 End stage renal disease: Secondary | ICD-10-CM | POA: Diagnosis not present

## 2016-03-04 DIAGNOSIS — N2581 Secondary hyperparathyroidism of renal origin: Secondary | ICD-10-CM | POA: Diagnosis not present

## 2016-03-06 DIAGNOSIS — D631 Anemia in chronic kidney disease: Secondary | ICD-10-CM | POA: Diagnosis not present

## 2016-03-06 DIAGNOSIS — N2581 Secondary hyperparathyroidism of renal origin: Secondary | ICD-10-CM | POA: Diagnosis not present

## 2016-03-06 DIAGNOSIS — N184 Chronic kidney disease, stage 4 (severe): Secondary | ICD-10-CM | POA: Diagnosis not present

## 2016-03-06 DIAGNOSIS — N186 End stage renal disease: Secondary | ICD-10-CM | POA: Diagnosis not present

## 2016-03-09 ENCOUNTER — Other Ambulatory Visit: Payer: Self-pay | Admitting: Internal Medicine

## 2016-03-09 DIAGNOSIS — N186 End stage renal disease: Secondary | ICD-10-CM | POA: Diagnosis not present

## 2016-03-09 DIAGNOSIS — N184 Chronic kidney disease, stage 4 (severe): Secondary | ICD-10-CM | POA: Diagnosis not present

## 2016-03-09 DIAGNOSIS — D631 Anemia in chronic kidney disease: Secondary | ICD-10-CM | POA: Diagnosis not present

## 2016-03-09 DIAGNOSIS — N2581 Secondary hyperparathyroidism of renal origin: Secondary | ICD-10-CM | POA: Diagnosis not present

## 2016-03-11 DIAGNOSIS — N184 Chronic kidney disease, stage 4 (severe): Secondary | ICD-10-CM | POA: Diagnosis not present

## 2016-03-11 DIAGNOSIS — D631 Anemia in chronic kidney disease: Secondary | ICD-10-CM | POA: Diagnosis not present

## 2016-03-11 DIAGNOSIS — N2581 Secondary hyperparathyroidism of renal origin: Secondary | ICD-10-CM | POA: Diagnosis not present

## 2016-03-11 DIAGNOSIS — N186 End stage renal disease: Secondary | ICD-10-CM | POA: Diagnosis not present

## 2016-03-13 DIAGNOSIS — N184 Chronic kidney disease, stage 4 (severe): Secondary | ICD-10-CM | POA: Diagnosis not present

## 2016-03-13 DIAGNOSIS — N2581 Secondary hyperparathyroidism of renal origin: Secondary | ICD-10-CM | POA: Diagnosis not present

## 2016-03-13 DIAGNOSIS — D631 Anemia in chronic kidney disease: Secondary | ICD-10-CM | POA: Diagnosis not present

## 2016-03-13 DIAGNOSIS — N186 End stage renal disease: Secondary | ICD-10-CM | POA: Diagnosis not present

## 2016-03-16 DIAGNOSIS — N2581 Secondary hyperparathyroidism of renal origin: Secondary | ICD-10-CM | POA: Diagnosis not present

## 2016-03-16 DIAGNOSIS — N186 End stage renal disease: Secondary | ICD-10-CM | POA: Diagnosis not present

## 2016-03-16 DIAGNOSIS — D631 Anemia in chronic kidney disease: Secondary | ICD-10-CM | POA: Diagnosis not present

## 2016-03-16 DIAGNOSIS — N184 Chronic kidney disease, stage 4 (severe): Secondary | ICD-10-CM | POA: Diagnosis not present

## 2016-03-18 DIAGNOSIS — D631 Anemia in chronic kidney disease: Secondary | ICD-10-CM | POA: Diagnosis not present

## 2016-03-18 DIAGNOSIS — N2581 Secondary hyperparathyroidism of renal origin: Secondary | ICD-10-CM | POA: Diagnosis not present

## 2016-03-18 DIAGNOSIS — N184 Chronic kidney disease, stage 4 (severe): Secondary | ICD-10-CM | POA: Diagnosis not present

## 2016-03-18 DIAGNOSIS — N186 End stage renal disease: Secondary | ICD-10-CM | POA: Diagnosis not present

## 2016-03-20 DIAGNOSIS — N186 End stage renal disease: Secondary | ICD-10-CM | POA: Diagnosis not present

## 2016-03-20 DIAGNOSIS — N2581 Secondary hyperparathyroidism of renal origin: Secondary | ICD-10-CM | POA: Diagnosis not present

## 2016-03-20 DIAGNOSIS — D631 Anemia in chronic kidney disease: Secondary | ICD-10-CM | POA: Diagnosis not present

## 2016-03-20 DIAGNOSIS — N184 Chronic kidney disease, stage 4 (severe): Secondary | ICD-10-CM | POA: Diagnosis not present

## 2016-03-23 DIAGNOSIS — N184 Chronic kidney disease, stage 4 (severe): Secondary | ICD-10-CM | POA: Diagnosis not present

## 2016-03-23 DIAGNOSIS — N186 End stage renal disease: Secondary | ICD-10-CM | POA: Diagnosis not present

## 2016-03-23 DIAGNOSIS — D631 Anemia in chronic kidney disease: Secondary | ICD-10-CM | POA: Diagnosis not present

## 2016-03-23 DIAGNOSIS — N2581 Secondary hyperparathyroidism of renal origin: Secondary | ICD-10-CM | POA: Diagnosis not present

## 2016-03-25 DIAGNOSIS — N184 Chronic kidney disease, stage 4 (severe): Secondary | ICD-10-CM | POA: Diagnosis not present

## 2016-03-25 DIAGNOSIS — D631 Anemia in chronic kidney disease: Secondary | ICD-10-CM | POA: Diagnosis not present

## 2016-03-25 DIAGNOSIS — N2581 Secondary hyperparathyroidism of renal origin: Secondary | ICD-10-CM | POA: Diagnosis not present

## 2016-03-25 DIAGNOSIS — N186 End stage renal disease: Secondary | ICD-10-CM | POA: Diagnosis not present

## 2016-03-27 DIAGNOSIS — D631 Anemia in chronic kidney disease: Secondary | ICD-10-CM | POA: Diagnosis not present

## 2016-03-27 DIAGNOSIS — N184 Chronic kidney disease, stage 4 (severe): Secondary | ICD-10-CM | POA: Diagnosis not present

## 2016-03-27 DIAGNOSIS — N186 End stage renal disease: Secondary | ICD-10-CM | POA: Diagnosis not present

## 2016-03-27 DIAGNOSIS — N2581 Secondary hyperparathyroidism of renal origin: Secondary | ICD-10-CM | POA: Diagnosis not present

## 2016-03-30 DIAGNOSIS — N186 End stage renal disease: Secondary | ICD-10-CM | POA: Diagnosis not present

## 2016-03-30 DIAGNOSIS — N184 Chronic kidney disease, stage 4 (severe): Secondary | ICD-10-CM | POA: Diagnosis not present

## 2016-03-30 DIAGNOSIS — N2581 Secondary hyperparathyroidism of renal origin: Secondary | ICD-10-CM | POA: Diagnosis not present

## 2016-03-30 DIAGNOSIS — D631 Anemia in chronic kidney disease: Secondary | ICD-10-CM | POA: Diagnosis not present

## 2016-04-01 DIAGNOSIS — D631 Anemia in chronic kidney disease: Secondary | ICD-10-CM | POA: Diagnosis not present

## 2016-04-01 DIAGNOSIS — N184 Chronic kidney disease, stage 4 (severe): Secondary | ICD-10-CM | POA: Diagnosis not present

## 2016-04-01 DIAGNOSIS — N186 End stage renal disease: Secondary | ICD-10-CM | POA: Diagnosis not present

## 2016-04-01 DIAGNOSIS — N2581 Secondary hyperparathyroidism of renal origin: Secondary | ICD-10-CM | POA: Diagnosis not present

## 2016-04-03 DIAGNOSIS — N184 Chronic kidney disease, stage 4 (severe): Secondary | ICD-10-CM | POA: Diagnosis not present

## 2016-04-03 DIAGNOSIS — D631 Anemia in chronic kidney disease: Secondary | ICD-10-CM | POA: Diagnosis not present

## 2016-04-03 DIAGNOSIS — Z992 Dependence on renal dialysis: Secondary | ICD-10-CM | POA: Diagnosis not present

## 2016-04-03 DIAGNOSIS — N2581 Secondary hyperparathyroidism of renal origin: Secondary | ICD-10-CM | POA: Diagnosis not present

## 2016-04-03 DIAGNOSIS — N2889 Other specified disorders of kidney and ureter: Secondary | ICD-10-CM | POA: Diagnosis not present

## 2016-04-03 DIAGNOSIS — N186 End stage renal disease: Secondary | ICD-10-CM | POA: Diagnosis not present

## 2016-04-05 DIAGNOSIS — N186 End stage renal disease: Secondary | ICD-10-CM | POA: Diagnosis not present

## 2016-04-05 DIAGNOSIS — D631 Anemia in chronic kidney disease: Secondary | ICD-10-CM | POA: Diagnosis not present

## 2016-04-05 DIAGNOSIS — N2581 Secondary hyperparathyroidism of renal origin: Secondary | ICD-10-CM | POA: Diagnosis not present

## 2016-04-05 DIAGNOSIS — N184 Chronic kidney disease, stage 4 (severe): Secondary | ICD-10-CM | POA: Diagnosis not present

## 2016-04-06 DIAGNOSIS — N186 End stage renal disease: Secondary | ICD-10-CM | POA: Diagnosis not present

## 2016-04-06 DIAGNOSIS — D631 Anemia in chronic kidney disease: Secondary | ICD-10-CM | POA: Diagnosis not present

## 2016-04-06 DIAGNOSIS — N184 Chronic kidney disease, stage 4 (severe): Secondary | ICD-10-CM | POA: Diagnosis not present

## 2016-04-06 DIAGNOSIS — N2581 Secondary hyperparathyroidism of renal origin: Secondary | ICD-10-CM | POA: Diagnosis not present

## 2016-04-08 DIAGNOSIS — D631 Anemia in chronic kidney disease: Secondary | ICD-10-CM | POA: Diagnosis not present

## 2016-04-08 DIAGNOSIS — N186 End stage renal disease: Secondary | ICD-10-CM | POA: Diagnosis not present

## 2016-04-08 DIAGNOSIS — N184 Chronic kidney disease, stage 4 (severe): Secondary | ICD-10-CM | POA: Diagnosis not present

## 2016-04-08 DIAGNOSIS — N2581 Secondary hyperparathyroidism of renal origin: Secondary | ICD-10-CM | POA: Diagnosis not present

## 2016-04-09 ENCOUNTER — Encounter: Payer: Self-pay | Admitting: Gastroenterology

## 2016-04-10 DIAGNOSIS — D631 Anemia in chronic kidney disease: Secondary | ICD-10-CM | POA: Diagnosis not present

## 2016-04-10 DIAGNOSIS — N2581 Secondary hyperparathyroidism of renal origin: Secondary | ICD-10-CM | POA: Diagnosis not present

## 2016-04-10 DIAGNOSIS — N184 Chronic kidney disease, stage 4 (severe): Secondary | ICD-10-CM | POA: Diagnosis not present

## 2016-04-10 DIAGNOSIS — N186 End stage renal disease: Secondary | ICD-10-CM | POA: Diagnosis not present

## 2016-04-13 DIAGNOSIS — N186 End stage renal disease: Secondary | ICD-10-CM | POA: Diagnosis not present

## 2016-04-13 DIAGNOSIS — N2581 Secondary hyperparathyroidism of renal origin: Secondary | ICD-10-CM | POA: Diagnosis not present

## 2016-04-13 DIAGNOSIS — D631 Anemia in chronic kidney disease: Secondary | ICD-10-CM | POA: Diagnosis not present

## 2016-04-13 DIAGNOSIS — N184 Chronic kidney disease, stage 4 (severe): Secondary | ICD-10-CM | POA: Diagnosis not present

## 2016-04-15 DIAGNOSIS — N186 End stage renal disease: Secondary | ICD-10-CM | POA: Diagnosis not present

## 2016-04-15 DIAGNOSIS — N184 Chronic kidney disease, stage 4 (severe): Secondary | ICD-10-CM | POA: Diagnosis not present

## 2016-04-15 DIAGNOSIS — D631 Anemia in chronic kidney disease: Secondary | ICD-10-CM | POA: Diagnosis not present

## 2016-04-15 DIAGNOSIS — N2581 Secondary hyperparathyroidism of renal origin: Secondary | ICD-10-CM | POA: Diagnosis not present

## 2016-04-16 ENCOUNTER — Encounter: Payer: Self-pay | Admitting: Gastroenterology

## 2016-04-16 ENCOUNTER — Encounter (INDEPENDENT_AMBULATORY_CARE_PROVIDER_SITE_OTHER): Payer: Self-pay

## 2016-04-16 ENCOUNTER — Ambulatory Visit (INDEPENDENT_AMBULATORY_CARE_PROVIDER_SITE_OTHER): Payer: Medicare Other | Admitting: Gastroenterology

## 2016-04-16 VITALS — BP 120/70 | HR 82 | Ht 70.0 in | Wt 166.0 lb

## 2016-04-16 DIAGNOSIS — D649 Anemia, unspecified: Secondary | ICD-10-CM

## 2016-04-16 DIAGNOSIS — R195 Other fecal abnormalities: Secondary | ICD-10-CM | POA: Diagnosis not present

## 2016-04-16 MED ORDER — NA SULFATE-K SULFATE-MG SULF 17.5-3.13-1.6 GM/177ML PO SOLN: ORAL | 0 refills | Status: DC

## 2016-04-16 NOTE — Progress Notes (Signed)
04/16/2016 Eric [name] [id number] 01-Mar-1930   HISTORY OF PRESENT ILLNESS:  This is an 83 year old male who has been referred here for anemia and heme-positive stools by Dr. Lorrene Reid.  I only have his most recent hemoglobin so nothing for comparison.  That was 9.1 g.  His MCV is actually high at 104. Ferritin is high at 1968. Iron saturation normal at 20%. He has ESRD and is on HD TTS.  He reports intermittent rectal bleeding since his 25s that he's been told on multiple occasions have been due to hemorrhoids. Has had regular colonoscopies with the last one 5 or more years ago in [place]. Other than the bright red blood that he has always seen, he denies any other increase or changes in bleeding. No black stools reported. He moves his bowels regularly and denies any abdominal pain.  He reports that he also had a history of Barrett's esophagus and has had EGDs in the past. He takes omeprazole and says that essentially his Barrett's esophagus "resolved" with taking the PPI. He denies any weight loss or issues swallowing.   He is here today with his wife and daughter. Other than his chronic kidney disease he really does not have a lot of chronic medical problems. He underwent a CABG with aortic valve replacement with bioprosthetic valve in December 2016 without any issues. He is not on any anticoagulation.   Past Medical History:  Diagnosis Date  . Anxiety   . Arthritis   . CKD (chronic kidney disease) stage 5, GFR less than 15 ml/min (HCC)   . Complication of anesthesia    " one time my heart stopped due to a medication that I was on."   . GERD (gastroesophageal reflux disease)   . Gout   . Heart murmur    as a teen  . Hemodialysis patient [facility])    Tues, Thursday, Saturday  . Herpes zoster 04/2012  . Hypertension   . Shortness of breath dyspnea    with exertion  . Unspecified essential hypertension    Past Surgical History:  Procedure Laterality Date  . AORTIC VALVE REPLACEMENT  N/A 12/09/2014   Procedure: AORTIC VALVE REPLACEMENT (AVR) WITH 23MM MAGNA EASE BIOPROSTHETIC VALVE;  Surgeon: Gaye Pollack, MD;  Location: [facility] OR;  Service: Open Heart Surgery;  Laterality: N/A;  . APPENDECTOMY  1944  . AV FISTULA PLACEMENT Left    Left Cimino AVF placed in [place]   . AV FISTULA PLACEMENT Right 03/06/2014   Procedure: ARTERIOVENOUS (AV) FISTULA CREATION-right radiocephalic;  Surgeon: Mal Misty, MD;  Location: [facility] OR;  Service: Vascular;  Laterality: Right;  . AV FISTULA PLACEMENT Right 07/15/2014   Procedure: ARTERIOVENOUS (AV) FISTULA CREATION;  Surgeon: Elam Dutch, MD;  Location: [place];  Service: Vascular;  Laterality: Right;  . CARDIAC CATHETERIZATION N/A 11/27/2014   Procedure: Right/Left Heart Cath and Coronary Angiography;  Surgeon: Burnell Blanks, MD;  Location: [place] [facility];  Service: Cardiovascular;  Laterality: N/A;  . CATARACT EXTRACTION W/ INTRAOCULAR LENS IMPLANT Bilateral 2014   [facility]  . COLONOSCOPY  2013   Dr. Annamaria Helling [place], [place].  . CORONARY ARTERY BYPASS GRAFT N/A 12/09/2014   Procedure: CORONARY ARTERY BYPASS GRAFTING (CABG), ON PUMP, TIMES THREE, USING LEFT INTERNAL MAMMARY ARTERY, RIGHT GREATER SAPHENOUS VEIN HARVESTED ENDOSCOPICALLY;  Surgeon: Gaye Pollack, MD;  Location: [facility];  Service: Open Heart Surgery;  Laterality: N/A;  LIMA-LAD; SVG-OM; SVG-PD  . DIALYSIS FISTULA CREATION  08/04/2012 and 10/13   Dr. Harden Mo  . EYE SURGERY     Cataract  . INSERTION OF DIALYSIS CATHETER Right 01-15-14   Right chest TDC placed by Dr. Augustin Coupe at [facility]  . LIGATION OF ARTERIOVENOUS  FISTULA Right 07/15/2014   Procedure: LIGATION OF ARTERIOVENOUS  FISTULA  (RIGHT RADIOCEPHALIC);  Surgeon: Elam Dutch, MD;  Location: [facility];  Service: Vascular;  Laterality: Right;  . LIGATION OF COMPETING BRANCHES OF ARTERIOVENOUS FISTULA Right 05/08/2014   Procedure: LIGATION OF COMPETING BRANCHES OF RIGHT ARM RADIOCEPHALIC  ARTERIOVENOUS FISTULA;  Surgeon: Mal Misty, MD;  Location: [facility];  Service: Vascular;  Laterality: Right;  . REVISON OF ARTERIOVENOUS FISTULA Right 07/15/2014   Procedure: EXPLORATION OF ARTERIOVENOUS FISTULA;  Surgeon: Elam Dutch, MD;  Location: [place];  Service: Vascular;  Laterality: Right;  . TEE WITHOUT CARDIOVERSION N/A 12/09/2014   Procedure: TRANSESOPHAGEAL ECHOCARDIOGRAM (TEE);  Surgeon: Gaye Pollack, MD;  Location: [facility];  Service: Open Heart Surgery;  Laterality: N/A;    reports that he has never smoked. He has never used smokeless tobacco. He reports that he drinks about 1.2 oz of alcohol per week . He reports that he does not use drugs. family history includes Cancer in his mother; Diabetes in his father; Lung cancer (age of onset: 56) in his father. No Known Allergies    Outpatient Encounter Prescriptions as of 04/16/2016  Medication Sig  . allopurinol (ZYLOPRIM) 100 MG tablet TAKE 1 TABLET BY MOUTH DAILY  . amLODipine (NORVASC) 10 MG tablet TAKE 1 TABLET BY MOUTH DAILY TO CONTROL BLOOD PRESSURE  . aspirin EC 81 MG tablet Take 81 mg by mouth at bedtime.   Marland Kitchen atorvastatin (LIPITOR) 20 MG tablet TAKE 1 TABLET BY MOUTH DAILY FOR CHOLESTEROL  . calcium carbonate (TUMS - DOSED IN MG ELEMENTAL CALCIUM) 500 MG chewable tablet Chew 2 tablets by mouth 3 (three) times daily with meals.  . metoprolol tartrate (LOPRESSOR) 25 MG tablet Take 0.5 tablets (12.5 mg total) by mouth 2 (two) times daily.  Marland Kitchen omeprazole (PRILOSEC) 20 MG capsule Take 20 mg by mouth daily.   . Na Sulfate-K Sulfate-Mg Sulf 17.5-3.13-1.6 GM/180ML SOLN As directed   No facility-administered encounter medications on file as of 04/16/2016.      REVIEW OF SYSTEMS  : All other systems reviewed and negative except where noted in the History of Present Illness.   PHYSICAL EXAM: BP 120/70   Pulse 82   Ht 5\' 10"  (1.778 m)   Wt 166 lb (75.3 kg)   BMI 23.82 kg/m  General: Well developed white male in no acute  distress Head: Normocephalic and atraumatic Eyes:  Sclerae anicteric, conjunctiva pink. Ears: Normal auditory acuity Lungs: Clear throughout to auscultation Heart: Regular rate and rhythm Abdomen: Soft, non-distended. Normal bowel sounds.  Non-tender. Rectal:  Will be done at the time of colonoscopy. Musculoskeletal: Symmetrical with no gross deformities  Skin: No lesions on visible extremities Extremities: No edema  Neurological: Alert oriented x 4, grossly non-focal Psychological:  Alert and cooperative. Normal mood and affect  ASSESSMENT AND PLAN: -83 year old male with a normocytic anemia and heme-positive stools:  I only have his most recent hemoglobin so nothing for comparison. His anemia very well may be due to his chronic kidney disease. He is not iron deficient as iron studies are completely normal and ferritin is high. He reports intermittent rectal bleeding since his 43s that he's been told on multiple occasions have been due to  hemorrhoids. Has had regular colonoscopies with the last one 5 or more years ago in [place]. I had have discussed in detail with the patient, his wife, and his daughter regarding evaluation of heme-positive stools. We discussed EGD and colonoscopy versus observation and monitoring his hemoglobin. They have elected to schedule the procedures for now but are going to discuss more with family and will call to cancel if they decide to hold off. -ESRD on HD TTS  *The risks, benefits, and alternatives to EGD and colonoscopy were discussed with the patient and he consents to proceed.   CC:  Gayland Curry, DO CC:  Dr. Lorrene Reid

## 2016-04-16 NOTE — Progress Notes (Signed)
Agree with assessment and plan as outlined.  

## 2016-04-16 NOTE — Patient Instructions (Addendum)
You have been scheduled for an endoscopy and colonoscopy. Please follow the written instructions given to you at your visit today. Please pick up your prep supplies at the pharmacy within the next 1-3 days. If you use inhalers (even only as needed), please bring them with you on the day of your procedure  We have sent the following medications to your pharmacy for you to pick up at your convenience: Suprep  Normal BMI (Body Mass Index- based on height and weight) is between 23 and 30. Your BMI today is Body mass index is 23.82 kg/m. Marland Kitchen Please consider follow up  regarding your BMI with your Primary Care Provider.  Thank You

## 2016-04-17 DIAGNOSIS — D631 Anemia in chronic kidney disease: Secondary | ICD-10-CM | POA: Diagnosis not present

## 2016-04-17 DIAGNOSIS — N2581 Secondary hyperparathyroidism of renal origin: Secondary | ICD-10-CM | POA: Diagnosis not present

## 2016-04-17 DIAGNOSIS — N186 End stage renal disease: Secondary | ICD-10-CM | POA: Diagnosis not present

## 2016-04-17 DIAGNOSIS — N184 Chronic kidney disease, stage 4 (severe): Secondary | ICD-10-CM | POA: Diagnosis not present

## 2016-04-20 DIAGNOSIS — D631 Anemia in chronic kidney disease: Secondary | ICD-10-CM | POA: Diagnosis not present

## 2016-04-20 DIAGNOSIS — N186 End stage renal disease: Secondary | ICD-10-CM | POA: Diagnosis not present

## 2016-04-20 DIAGNOSIS — N2581 Secondary hyperparathyroidism of renal origin: Secondary | ICD-10-CM | POA: Diagnosis not present

## 2016-04-20 DIAGNOSIS — N184 Chronic kidney disease, stage 4 (severe): Secondary | ICD-10-CM | POA: Diagnosis not present

## 2016-04-22 DIAGNOSIS — N2581 Secondary hyperparathyroidism of renal origin: Secondary | ICD-10-CM | POA: Diagnosis not present

## 2016-04-22 DIAGNOSIS — D631 Anemia in chronic kidney disease: Secondary | ICD-10-CM | POA: Diagnosis not present

## 2016-04-22 DIAGNOSIS — N186 End stage renal disease: Secondary | ICD-10-CM | POA: Diagnosis not present

## 2016-04-22 DIAGNOSIS — N184 Chronic kidney disease, stage 4 (severe): Secondary | ICD-10-CM | POA: Diagnosis not present

## 2016-04-24 DIAGNOSIS — N186 End stage renal disease: Secondary | ICD-10-CM | POA: Diagnosis not present

## 2016-04-24 DIAGNOSIS — N2581 Secondary hyperparathyroidism of renal origin: Secondary | ICD-10-CM | POA: Diagnosis not present

## 2016-04-24 DIAGNOSIS — N184 Chronic kidney disease, stage 4 (severe): Secondary | ICD-10-CM | POA: Diagnosis not present

## 2016-04-24 DIAGNOSIS — D631 Anemia in chronic kidney disease: Secondary | ICD-10-CM | POA: Diagnosis not present

## 2016-04-27 DIAGNOSIS — N186 End stage renal disease: Secondary | ICD-10-CM | POA: Diagnosis not present

## 2016-04-27 DIAGNOSIS — N2581 Secondary hyperparathyroidism of renal origin: Secondary | ICD-10-CM | POA: Diagnosis not present

## 2016-04-27 DIAGNOSIS — D631 Anemia in chronic kidney disease: Secondary | ICD-10-CM | POA: Diagnosis not present

## 2016-04-27 DIAGNOSIS — N184 Chronic kidney disease, stage 4 (severe): Secondary | ICD-10-CM | POA: Diagnosis not present

## 2016-04-29 DIAGNOSIS — N184 Chronic kidney disease, stage 4 (severe): Secondary | ICD-10-CM | POA: Diagnosis not present

## 2016-04-29 DIAGNOSIS — N2581 Secondary hyperparathyroidism of renal origin: Secondary | ICD-10-CM | POA: Diagnosis not present

## 2016-04-29 DIAGNOSIS — N186 End stage renal disease: Secondary | ICD-10-CM | POA: Diagnosis not present

## 2016-04-29 DIAGNOSIS — D631 Anemia in chronic kidney disease: Secondary | ICD-10-CM | POA: Diagnosis not present

## 2016-05-01 DIAGNOSIS — N184 Chronic kidney disease, stage 4 (severe): Secondary | ICD-10-CM | POA: Diagnosis not present

## 2016-05-01 DIAGNOSIS — N186 End stage renal disease: Secondary | ICD-10-CM | POA: Diagnosis not present

## 2016-05-01 DIAGNOSIS — N2581 Secondary hyperparathyroidism of renal origin: Secondary | ICD-10-CM | POA: Diagnosis not present

## 2016-05-01 DIAGNOSIS — D631 Anemia in chronic kidney disease: Secondary | ICD-10-CM | POA: Diagnosis not present

## 2016-05-03 DIAGNOSIS — N186 End stage renal disease: Secondary | ICD-10-CM | POA: Diagnosis not present

## 2016-05-03 DIAGNOSIS — N2889 Other specified disorders of kidney and ureter: Secondary | ICD-10-CM | POA: Diagnosis not present

## 2016-05-03 DIAGNOSIS — Z992 Dependence on renal dialysis: Secondary | ICD-10-CM | POA: Diagnosis not present

## 2016-05-04 ENCOUNTER — Telehealth: Payer: Self-pay | Admitting: Gastroenterology

## 2016-05-04 DIAGNOSIS — D509 Iron deficiency anemia, unspecified: Secondary | ICD-10-CM | POA: Diagnosis not present

## 2016-05-04 DIAGNOSIS — N2581 Secondary hyperparathyroidism of renal origin: Secondary | ICD-10-CM | POA: Diagnosis not present

## 2016-05-04 DIAGNOSIS — D631 Anemia in chronic kidney disease: Secondary | ICD-10-CM | POA: Diagnosis not present

## 2016-05-04 DIAGNOSIS — N186 End stage renal disease: Secondary | ICD-10-CM | POA: Diagnosis not present

## 2016-05-04 DIAGNOSIS — N184 Chronic kidney disease, stage 4 (severe): Secondary | ICD-10-CM | POA: Diagnosis not present

## 2016-05-04 NOTE — Telephone Encounter (Signed)
Patient is having black stools.  He is scheduled for a endo/colon on 06/23/16.  Nephrology is wanting procedures done earlier.  Hgb is 8.0 and they are giving iron and Acera.  I could split the procedures, but that would not be until 6/4.  Janett Billow, you just saw him on 04/16/16.  Your note indicated he was not iron deficient.  Please advise

## 2016-05-04 NOTE — Telephone Encounter (Signed)
Routed to Patty as Alonza Bogus last saw patient.

## 2016-05-05 NOTE — Telephone Encounter (Signed)
I still think that his anemia is due to his CKD.  That being said, he was not having black stools when I saw him.  Have they placed him on iron supplements or is he taking any Pepto Bismol?  Anyway, we can certainly offer to split the procedures--I would do the EGD first in light of the black stools.  [facility] first available for an EGD is 6/4?

## 2016-05-05 NOTE — Telephone Encounter (Signed)
Left message for Eric [name] to return call back, there is one appt for EGD on 05/14/16 we can offer the pt with Dr Havery Moros.

## 2016-05-06 DIAGNOSIS — N186 End stage renal disease: Secondary | ICD-10-CM | POA: Diagnosis not present

## 2016-05-06 DIAGNOSIS — N184 Chronic kidney disease, stage 4 (severe): Secondary | ICD-10-CM | POA: Diagnosis not present

## 2016-05-06 DIAGNOSIS — D631 Anemia in chronic kidney disease: Secondary | ICD-10-CM | POA: Diagnosis not present

## 2016-05-06 DIAGNOSIS — D509 Iron deficiency anemia, unspecified: Secondary | ICD-10-CM | POA: Diagnosis not present

## 2016-05-06 DIAGNOSIS — N2581 Secondary hyperparathyroidism of renal origin: Secondary | ICD-10-CM | POA: Diagnosis not present

## 2016-05-06 NOTE — Telephone Encounter (Signed)
FYI Jess: I spoke with Caryl Pina and she states the pt does not want to split the procedures up and would prefer to keep the appts as scheduled.  If we have a cancellation for a double spot he would like to see if he could make that.  I have put him on the cancellation list.

## 2016-05-08 DIAGNOSIS — N2581 Secondary hyperparathyroidism of renal origin: Secondary | ICD-10-CM | POA: Diagnosis not present

## 2016-05-08 DIAGNOSIS — D509 Iron deficiency anemia, unspecified: Secondary | ICD-10-CM | POA: Diagnosis not present

## 2016-05-08 DIAGNOSIS — N186 End stage renal disease: Secondary | ICD-10-CM | POA: Diagnosis not present

## 2016-05-08 DIAGNOSIS — D631 Anemia in chronic kidney disease: Secondary | ICD-10-CM | POA: Diagnosis not present

## 2016-05-08 DIAGNOSIS — N184 Chronic kidney disease, stage 4 (severe): Secondary | ICD-10-CM | POA: Diagnosis not present

## 2016-05-11 DIAGNOSIS — N184 Chronic kidney disease, stage 4 (severe): Secondary | ICD-10-CM | POA: Diagnosis not present

## 2016-05-11 DIAGNOSIS — D631 Anemia in chronic kidney disease: Secondary | ICD-10-CM | POA: Diagnosis not present

## 2016-05-11 DIAGNOSIS — N186 End stage renal disease: Secondary | ICD-10-CM | POA: Diagnosis not present

## 2016-05-11 DIAGNOSIS — N2581 Secondary hyperparathyroidism of renal origin: Secondary | ICD-10-CM | POA: Diagnosis not present

## 2016-05-11 DIAGNOSIS — D509 Iron deficiency anemia, unspecified: Secondary | ICD-10-CM | POA: Diagnosis not present

## 2016-05-12 ENCOUNTER — Encounter: Payer: Self-pay | Admitting: Internal Medicine

## 2016-05-12 ENCOUNTER — Non-Acute Institutional Stay: Payer: Medicare Other | Admitting: Internal Medicine

## 2016-05-12 VITALS — BP 120/68 | HR 56 | Temp 97.7°F | Wt 167.0 lb

## 2016-05-12 DIAGNOSIS — R195 Other fecal abnormalities: Secondary | ICD-10-CM

## 2016-05-12 DIAGNOSIS — I10 Essential (primary) hypertension: Secondary | ICD-10-CM | POA: Diagnosis not present

## 2016-05-12 DIAGNOSIS — N186 End stage renal disease: Secondary | ICD-10-CM

## 2016-05-12 DIAGNOSIS — K2100 Gastro-esophageal reflux disease with esophagitis, without bleeding: Secondary | ICD-10-CM

## 2016-05-12 DIAGNOSIS — Z0000 Encounter for general adult medical examination without abnormal findings: Secondary | ICD-10-CM | POA: Diagnosis not present

## 2016-05-12 DIAGNOSIS — I25110 Atherosclerotic heart disease of native coronary artery with unstable angina pectoris: Secondary | ICD-10-CM

## 2016-05-12 DIAGNOSIS — I200 Unstable angina: Secondary | ICD-10-CM | POA: Diagnosis not present

## 2016-05-12 DIAGNOSIS — Z992 Dependence on renal dialysis: Secondary | ICD-10-CM

## 2016-05-12 DIAGNOSIS — Z952 Presence of prosthetic heart valve: Secondary | ICD-10-CM

## 2016-05-12 DIAGNOSIS — D649 Anemia, unspecified: Secondary | ICD-10-CM | POA: Diagnosis not present

## 2016-05-12 NOTE — Progress Notes (Signed)
Location:   [facility] Service:  Clinic (12) Provider:  L. Mariea Clonts, D.O., C.M.D.  Patient Care Team: Gayland Curry, DO as PCP - General (Geriatric Medicine) Deterding, Jeneen Rinks, MD as Consulting Physician (Nephrology) Dwana Melena, MD as Attending Physician (Nephrology)  Extended Emergency Contact Information Primary Emergency Contact: [facility] Address: [address]           [place], [place] [address] [name] of [place] Phone: [phone] Work Phone: [phone] Mobile Phone: [phone] Relation: Daughter Secondary Emergency Contact: Beckey Rutter Address: [address]           [place], [place] [address] [name] of [place] Phone: [phone] Mobile Phone: [phone] Relation: Spouse  Code Status: DNR Goals of Care: Advanced Directive information Advanced Directives 05/12/2016  Does Patient Have a Medical Advance Directive? Yes  Type of Advance Directive [place]  Does patient want to make changes to medical advance directive? -  Copy of [place] in Chart? Yes  Would patient like information on creating a medical advance directive? -   Chief Complaint  Patient presents with  . Annual Exam    wellness exam  . MMSE    30/30 passed clock    HPI: Patient is a 83 y.o. male seen in today for an annual wellness exam.  See flowsheets and data below.  Pt reports he's had some GI bleeding.  Apparently, he c/o this at dialysis and Dr. Lorrene Reid ordered some labs which showed hemoglobin down in the 8s about a month ago.  He was having dark stools and hemoccult was positive.  He was seen by [facility] PA and some of his anemia was felt to be CKD-related, but some also due to GI bleeding.  He is going to have EGD and cscope done 6/20.  He had noted increased fatigue and lack of stamina in recent months.  He reports that he had gotten back to walking the entire distance [place] [facility], but is now so  weak and fatigued he had to drive from [place] to the clinic this am.  His omeprazole was increased to bid.    He's putting off his eye exam until after his GI system done.    Depression screen [facility] 2/9 05/12/2016 04/30/2015 09/24/2013  Decreased Interest 0 0 0  Down, Depressed, Hopeless 0 0 0  PHQ - 2 Score 0 0 0    Fall Risk  05/12/2016 04/30/2015 09/24/2013  Falls in the past year? No No No   MMSE - Mini Mental State Exam 05/12/2016 04/30/2015  Orientation to time 5 5  Orientation to Place 5 5  Registration 3 3  Attention/ Calculation 5 5  Recall 3 3  Language- name 2 objects 2 2  Language- repeat 1 1  Language- follow 3 step command 3 3  Language- read & follow direction 1 1  Write a sentence 1 1  Copy design 1 1  Total score 30 30  passed clock but put numbers on the outside   Health Maintenance  Topic Date Due  . TETANUS/TDAP  06/22/1949  . INFLUENZA VACCINE  08/04/2016  . PNA vac Low Risk Adult  Completed    Functional Status Survey: Is the patient deaf or have difficulty hearing?: No Does the patient have difficulty seeing, even when wearing glasses/contacts?: No Does the patient have difficulty concentrating, remembering, or making decisions?: Yes Does the patient have difficulty walking or climbing stairs?: No Does the patient have difficulty  dressing or bathing?: No Does the patient have difficulty doing errands alone such as visiting a doctor's office or shopping?: No Current Exercise Habits: The patient does not participate in regular exercise at present (due to illness at this point) Exercise limited by: Other - see comments Diet?  Regular, avoid dark sodas due to his ESRD No exam data present Hearing:  HOH difficulty comprehending sometimes Dentition:  No dificulty Pain:none  Past Medical History:  Diagnosis Date  . Anxiety   . Arthritis   . CKD (chronic kidney disease) stage 5, GFR less than 15 ml/min (HCC)   . Complication of anesthesia    " one time my heart  stopped due to a medication that I was on."   . GERD (gastroesophageal reflux disease)   . Gout   . Heart murmur    as a teen  . Hemodialysis patient [facility])    Tues, Thursday, Saturday  . Herpes zoster 04/2012  . Hypertension   . Shortness of breath dyspnea    with exertion  . Unspecified essential hypertension     Past Surgical History:  Procedure Laterality Date  . AORTIC VALVE REPLACEMENT N/A 12/09/2014   Procedure: AORTIC VALVE REPLACEMENT (AVR) WITH 23MM MAGNA EASE BIOPROSTHETIC VALVE;  Surgeon: Gaye Pollack, MD;  Location: [facility] OR;  Service: Open Heart Surgery;  Laterality: N/A;  . APPENDECTOMY  1944  . AV FISTULA PLACEMENT Left    Left Cimino AVF placed in [place]   . AV FISTULA PLACEMENT Right 03/06/2014   Procedure: ARTERIOVENOUS (AV) FISTULA CREATION-right radiocephalic;  Surgeon: Mal Misty, MD;  Location: [facility];  Service: Vascular;  Laterality: Right;  . AV FISTULA PLACEMENT Right 07/15/2014   Procedure: ARTERIOVENOUS (AV) FISTULA CREATION;  Surgeon: Elam Dutch, MD;  Location: [facility];  Service: Vascular;  Laterality: Right;  . CARDIAC CATHETERIZATION N/A 11/27/2014   Procedure: Right/Left Heart Cath and Coronary Angiography;  Surgeon: Burnell Blanks, MD;  Location: [facility] CV LAB;  Service: Cardiovascular;  Laterality: N/A;  . CATARACT EXTRACTION W/ INTRAOCULAR LENS IMPLANT Bilateral 2014   [facility]  . COLONOSCOPY  2013   Dr. Annamaria Helling [place], [place].  . CORONARY ARTERY BYPASS GRAFT N/A 12/09/2014   Procedure: CORONARY ARTERY BYPASS GRAFTING (CABG), ON PUMP, TIMES THREE, USING LEFT INTERNAL MAMMARY ARTERY, RIGHT GREATER SAPHENOUS VEIN HARVESTED ENDOSCOPICALLY;  Surgeon: Gaye Pollack, MD;  Location: [facility];  Service: Open Heart Surgery;  Laterality: N/A;  LIMA-LAD; SVG-OM; SVG-PD  . DIALYSIS FISTULA CREATION  08/04/2012 and 10/13   Dr. Harden Mo  . EYE SURGERY     Cataract  . INSERTION OF DIALYSIS CATHETER Right 01-15-14   Right chest  TDC placed by Dr. Augustin Coupe at [facility]  . LIGATION OF ARTERIOVENOUS  FISTULA Right 07/15/2014   Procedure: LIGATION OF ARTERIOVENOUS  FISTULA  (RIGHT RADIOCEPHALIC);  Surgeon: Elam Dutch, MD;  Location: [facility];  Service: Vascular;  Laterality: Right;  . LIGATION OF COMPETING BRANCHES OF ARTERIOVENOUS FISTULA Right 05/08/2014   Procedure: LIGATION OF COMPETING BRANCHES OF RIGHT ARM RADIOCEPHALIC ARTERIOVENOUS FISTULA;  Surgeon: Mal Misty, MD;  Location: [facility];  Service: Vascular;  Laterality: Right;  . REVISON OF ARTERIOVENOUS FISTULA Right 07/15/2014   Procedure: EXPLORATION OF ARTERIOVENOUS FISTULA;  Surgeon: Elam Dutch, MD;  Location: [facility];  Service: Vascular;  Laterality: Right;  . TEE WITHOUT CARDIOVERSION N/A 12/09/2014   Procedure: TRANSESOPHAGEAL ECHOCARDIOGRAM (TEE);  Surgeon: Gaye Pollack, MD;  Location:  [facility] OR;  Service: Open Heart Surgery;  Laterality: N/A;    Family History  Problem Relation Age of Onset  . Diabetes Father   . Lung cancer Father 56    non-smoker  . Cancer Mother     multiple myeloma  . Colon cancer Neg Hx     Social History   Social History  . Marital status: Married    Spouse name: Jan  . Number of children: 3  . Years of education: 22   Occupational History  . retired Designer, fashion/clothing    Social History Main Topics  . Smoking status: Never Smoker  . Smokeless tobacco: Never Used  . Alcohol use 1.2 oz/week    1 Glasses of wine, 1 Shots of liquor per week     Comment: one glass a day of either wine or liquor  . Drug use: No  . Sexual activity: Not Asked   Other Topics Concern  . None   Social History Narrative   Patient is Married since 1958. Occupation: Licensed conveyancer   Lives in apartment,  Independent Living  section at [facility] since 05/04/2013. Also has a home in [place], [place].   No Smoking history  Alcohol history: 5 drinks/ week   Regular exercise: 3-4 times a week , treadmill   Patient has Advanced  planning documents: Living Will, HCPOA             reports that he has never smoked. He has never used smokeless tobacco. He reports that he drinks about 1.2 oz of alcohol per week . He reports that he does not use drugs.  Allergies as of 05/12/2016   No Known Allergies     Medication List       Accurate as of 05/12/16 11:11 AM. Always use your most recent med list.          allopurinol 100 MG tablet Commonly known as:  ZYLOPRIM TAKE 1 TABLET BY MOUTH DAILY   amLODipine 10 MG tablet Commonly known as:  NORVASC TAKE 1 TABLET BY MOUTH DAILY TO CONTROL BLOOD PRESSURE   atorvastatin 20 MG tablet Commonly known as:  LIPITOR TAKE 1 TABLET BY MOUTH DAILY FOR CHOLESTEROL   calcium carbonate 500 MG chewable tablet Commonly known as:  TUMS - dosed in mg elemental calcium Chew 2 tablets by mouth 3 (three) times daily with meals.   cinacalcet 30 MG tablet Commonly known as:  SENSIPAR Take 30 mg by mouth daily.   metoprolol tartrate 25 MG tablet Commonly known as:  LOPRESSOR Take 0.5 tablets (12.5 mg total) by mouth 2 (two) times daily.   omeprazole 20 MG capsule Commonly known as:  PRILOSEC Take 20 mg by mouth 2 (two) times daily before a meal.       Review of S.ystems:  Review of Systems  Constitutional: Negative for chills, fever and malaise/fatigue.  HENT: Positive for hearing loss. Negative for congestion.   Eyes: Negative for blurred vision.       Reading glasses  Respiratory: Negative for cough and shortness of breath.   Cardiovascular: Negative for chest pain, palpitations and leg swelling.       Right av fistula  Gastrointestinal: Positive for blood in stool and melena. Negative for abdominal pain, constipation and diarrhea.       Not as regular as he was before his CABG  Genitourinary: Negative for dysuria.  Musculoskeletal: Negative for falls and joint pain.  Skin: Negative for itching and rash.  Neurological: Negative  for dizziness, loss of consciousness and  weakness.  Endo/Heme/Allergies: Does not bruise/bleed easily.  Psychiatric/Behavioral: Positive for memory loss. Negative for depression. The patient is not nervous/anxious and does not have insomnia.     Physical Exam: Vitals:   05/12/16 1010  BP: 120/68  Pulse: (!) 56  Temp: 97.7 F (36.5 C)  TempSrc: Oral  SpO2: 97%  Weight: 167 lb (75.8 kg)   Body mass index is 23.96 kg/m. Physical Exam  Constitutional: He is oriented to person, place, and time. No distress.  Increasingly sarcopenic  HENT:  Head: Normocephalic and atraumatic.  Right Ear: External ear normal.  Left Ear: External ear normal.  Nose: Nose normal.  Mouth/Throat: Oropharynx is clear and moist. No oropharyngeal exudate.  Small amt of cerumen left ear canal  Eyes: Conjunctivae and EOM are normal. Pupils are equal, round, and reactive to light.  Neck: Normal range of motion. Neck supple. No JVD present. No tracheal deviation present. No thyromegaly present.  Cardiovascular: Normal rate and regular rhythm.   Murmur heard. Pulmonary/Chest: Effort normal and breath sounds normal. No respiratory distress. He has no rales.  Abdominal: Soft. Bowel sounds are normal. He exhibits no distension. There is no tenderness.  Musculoskeletal: Normal range of motion. He exhibits no edema, tenderness or deformity.  Lymphadenopathy:    He has no cervical adenopathy.  Neurological: He is alert and oriented to person, place, and time. He displays normal reflexes. No cranial nerve deficit.  Some difficulty with word finding  Skin: Skin is warm and dry. Capillary refill takes less than 2 seconds. There is pallor.  Psychiatric: He has a normal mood and affect. His behavior is normal.    Labs reviewed: Basic Metabolic Panel: No results for input(s): NA, K, CL, CO2, GLUCOSE, BUN, CREATININE, CALCIUM, MG, PHOS, TSH in the last 8760 hours. Liver Function Tests: No results for input(s): AST, ALT, ALKPHOS, BILITOT, PROT, ALBUMIN in the  last 8760 hours. No results for input(s): LIPASE, AMYLASE in the last 8760 hours. No results for input(s): AMMONIA in the last 8760 hours. CBC: No results for input(s): WBC, NEUTROABS, HGB, HCT, MCV, PLT in the last 8760 hours. Lipid Panel: No results for input(s): CHOL, HDL, LDLCALC, TRIG, CHOLHDL, LDLDIRECT in the last 8760 hours. Lab Results  Component Value Date   HGBA1C 5.2 12/06/2014    Procedures: Had recent hemoccult positive and drop in hgb thru nephrology  Assessment/Plan 1. Medicare annual wellness visit, subsequent -performed today by me, see above and flow sheets  2. Coronary artery disease involving native coronary artery of native heart with unstable angina pectoris (HCC) -cont amlodipine, lipitor, lopressor -he's off asa due to #3  3. Heme positive stool -has upcoming cscope and EGD  4. ESRD on dialysis [facility]) -continues on tiw dialysis, no changes per pt, but is on sensipar now  5. Essential hypertension -bp at goal with his current regimen  6. Gastroesophageal reflux disease with esophagitis -no symptoms, but omeprazole was increased to bid before meals due to #3  7. Normocytic anemia -await GI workup  8. S/P AVR -has chronic aortic murmur as a result, no new symptoms or concerns from this perspective  Labs/tests ordered:  Refuses labs here, gets at HD and seeing GI now  Next appt:  6 mos  Med mgt  Darcey Cardy L. Erina Hamme, D.O. [facility] [address], [place] [address] Cell Phone (Mon-Fri 8am-5pm):  [phone] On Call:  [phone] & follow prompts after  5pm & weekends Office Phone:  [phone] Office Fax:  [phone]

## 2016-05-13 DIAGNOSIS — N186 End stage renal disease: Secondary | ICD-10-CM | POA: Diagnosis not present

## 2016-05-13 DIAGNOSIS — D631 Anemia in chronic kidney disease: Secondary | ICD-10-CM | POA: Diagnosis not present

## 2016-05-13 DIAGNOSIS — D509 Iron deficiency anemia, unspecified: Secondary | ICD-10-CM | POA: Diagnosis not present

## 2016-05-13 DIAGNOSIS — N2581 Secondary hyperparathyroidism of renal origin: Secondary | ICD-10-CM | POA: Diagnosis not present

## 2016-05-13 DIAGNOSIS — N184 Chronic kidney disease, stage 4 (severe): Secondary | ICD-10-CM | POA: Diagnosis not present

## 2016-05-15 DIAGNOSIS — N186 End stage renal disease: Secondary | ICD-10-CM | POA: Diagnosis not present

## 2016-05-15 DIAGNOSIS — D509 Iron deficiency anemia, unspecified: Secondary | ICD-10-CM | POA: Diagnosis not present

## 2016-05-15 DIAGNOSIS — N184 Chronic kidney disease, stage 4 (severe): Secondary | ICD-10-CM | POA: Diagnosis not present

## 2016-05-15 DIAGNOSIS — D631 Anemia in chronic kidney disease: Secondary | ICD-10-CM | POA: Diagnosis not present

## 2016-05-15 DIAGNOSIS — N2581 Secondary hyperparathyroidism of renal origin: Secondary | ICD-10-CM | POA: Diagnosis not present

## 2016-05-18 DIAGNOSIS — N184 Chronic kidney disease, stage 4 (severe): Secondary | ICD-10-CM | POA: Diagnosis not present

## 2016-05-18 DIAGNOSIS — N2581 Secondary hyperparathyroidism of renal origin: Secondary | ICD-10-CM | POA: Diagnosis not present

## 2016-05-18 DIAGNOSIS — D509 Iron deficiency anemia, unspecified: Secondary | ICD-10-CM | POA: Diagnosis not present

## 2016-05-18 DIAGNOSIS — D631 Anemia in chronic kidney disease: Secondary | ICD-10-CM | POA: Diagnosis not present

## 2016-05-18 DIAGNOSIS — N186 End stage renal disease: Secondary | ICD-10-CM | POA: Diagnosis not present

## 2016-05-20 DIAGNOSIS — D631 Anemia in chronic kidney disease: Secondary | ICD-10-CM | POA: Diagnosis not present

## 2016-05-20 DIAGNOSIS — D509 Iron deficiency anemia, unspecified: Secondary | ICD-10-CM | POA: Diagnosis not present

## 2016-05-20 DIAGNOSIS — N186 End stage renal disease: Secondary | ICD-10-CM | POA: Diagnosis not present

## 2016-05-20 DIAGNOSIS — N2581 Secondary hyperparathyroidism of renal origin: Secondary | ICD-10-CM | POA: Diagnosis not present

## 2016-05-20 DIAGNOSIS — N184 Chronic kidney disease, stage 4 (severe): Secondary | ICD-10-CM | POA: Diagnosis not present

## 2016-05-22 DIAGNOSIS — N186 End stage renal disease: Secondary | ICD-10-CM | POA: Diagnosis not present

## 2016-05-22 DIAGNOSIS — D631 Anemia in chronic kidney disease: Secondary | ICD-10-CM | POA: Diagnosis not present

## 2016-05-22 DIAGNOSIS — N184 Chronic kidney disease, stage 4 (severe): Secondary | ICD-10-CM | POA: Diagnosis not present

## 2016-05-22 DIAGNOSIS — N2581 Secondary hyperparathyroidism of renal origin: Secondary | ICD-10-CM | POA: Diagnosis not present

## 2016-05-22 DIAGNOSIS — D509 Iron deficiency anemia, unspecified: Secondary | ICD-10-CM | POA: Diagnosis not present

## 2016-05-25 DIAGNOSIS — N186 End stage renal disease: Secondary | ICD-10-CM | POA: Diagnosis not present

## 2016-05-25 DIAGNOSIS — D631 Anemia in chronic kidney disease: Secondary | ICD-10-CM | POA: Diagnosis not present

## 2016-05-25 DIAGNOSIS — D509 Iron deficiency anemia, unspecified: Secondary | ICD-10-CM | POA: Diagnosis not present

## 2016-05-25 DIAGNOSIS — N2581 Secondary hyperparathyroidism of renal origin: Secondary | ICD-10-CM | POA: Diagnosis not present

## 2016-05-25 DIAGNOSIS — N184 Chronic kidney disease, stage 4 (severe): Secondary | ICD-10-CM | POA: Diagnosis not present

## 2016-05-27 DIAGNOSIS — N186 End stage renal disease: Secondary | ICD-10-CM | POA: Diagnosis not present

## 2016-05-27 DIAGNOSIS — N2581 Secondary hyperparathyroidism of renal origin: Secondary | ICD-10-CM | POA: Diagnosis not present

## 2016-05-27 DIAGNOSIS — D509 Iron deficiency anemia, unspecified: Secondary | ICD-10-CM | POA: Diagnosis not present

## 2016-05-27 DIAGNOSIS — D631 Anemia in chronic kidney disease: Secondary | ICD-10-CM | POA: Diagnosis not present

## 2016-05-27 DIAGNOSIS — N184 Chronic kidney disease, stage 4 (severe): Secondary | ICD-10-CM | POA: Diagnosis not present

## 2016-05-29 DIAGNOSIS — N184 Chronic kidney disease, stage 4 (severe): Secondary | ICD-10-CM | POA: Diagnosis not present

## 2016-05-29 DIAGNOSIS — D509 Iron deficiency anemia, unspecified: Secondary | ICD-10-CM | POA: Diagnosis not present

## 2016-05-29 DIAGNOSIS — N186 End stage renal disease: Secondary | ICD-10-CM | POA: Diagnosis not present

## 2016-05-29 DIAGNOSIS — D631 Anemia in chronic kidney disease: Secondary | ICD-10-CM | POA: Diagnosis not present

## 2016-05-29 DIAGNOSIS — N2581 Secondary hyperparathyroidism of renal origin: Secondary | ICD-10-CM | POA: Diagnosis not present

## 2016-06-01 DIAGNOSIS — D509 Iron deficiency anemia, unspecified: Secondary | ICD-10-CM | POA: Diagnosis not present

## 2016-06-01 DIAGNOSIS — N184 Chronic kidney disease, stage 4 (severe): Secondary | ICD-10-CM | POA: Diagnosis not present

## 2016-06-01 DIAGNOSIS — N2581 Secondary hyperparathyroidism of renal origin: Secondary | ICD-10-CM | POA: Diagnosis not present

## 2016-06-01 DIAGNOSIS — D631 Anemia in chronic kidney disease: Secondary | ICD-10-CM | POA: Diagnosis not present

## 2016-06-01 DIAGNOSIS — N186 End stage renal disease: Secondary | ICD-10-CM | POA: Diagnosis not present

## 2016-06-03 DIAGNOSIS — N2581 Secondary hyperparathyroidism of renal origin: Secondary | ICD-10-CM | POA: Diagnosis not present

## 2016-06-03 DIAGNOSIS — D631 Anemia in chronic kidney disease: Secondary | ICD-10-CM | POA: Diagnosis not present

## 2016-06-03 DIAGNOSIS — Z992 Dependence on renal dialysis: Secondary | ICD-10-CM | POA: Diagnosis not present

## 2016-06-03 DIAGNOSIS — N2889 Other specified disorders of kidney and ureter: Secondary | ICD-10-CM | POA: Diagnosis not present

## 2016-06-03 DIAGNOSIS — N186 End stage renal disease: Secondary | ICD-10-CM | POA: Diagnosis not present

## 2016-06-03 DIAGNOSIS — N184 Chronic kidney disease, stage 4 (severe): Secondary | ICD-10-CM | POA: Diagnosis not present

## 2016-06-03 DIAGNOSIS — D509 Iron deficiency anemia, unspecified: Secondary | ICD-10-CM | POA: Diagnosis not present

## 2016-06-05 DIAGNOSIS — D509 Iron deficiency anemia, unspecified: Secondary | ICD-10-CM | POA: Diagnosis not present

## 2016-06-05 DIAGNOSIS — D631 Anemia in chronic kidney disease: Secondary | ICD-10-CM | POA: Diagnosis not present

## 2016-06-05 DIAGNOSIS — N186 End stage renal disease: Secondary | ICD-10-CM | POA: Diagnosis not present

## 2016-06-05 DIAGNOSIS — N2581 Secondary hyperparathyroidism of renal origin: Secondary | ICD-10-CM | POA: Diagnosis not present

## 2016-06-05 DIAGNOSIS — N184 Chronic kidney disease, stage 4 (severe): Secondary | ICD-10-CM | POA: Diagnosis not present

## 2016-06-08 DIAGNOSIS — N186 End stage renal disease: Secondary | ICD-10-CM | POA: Diagnosis not present

## 2016-06-08 DIAGNOSIS — D509 Iron deficiency anemia, unspecified: Secondary | ICD-10-CM | POA: Diagnosis not present

## 2016-06-08 DIAGNOSIS — N2581 Secondary hyperparathyroidism of renal origin: Secondary | ICD-10-CM | POA: Diagnosis not present

## 2016-06-08 DIAGNOSIS — D631 Anemia in chronic kidney disease: Secondary | ICD-10-CM | POA: Diagnosis not present

## 2016-06-08 DIAGNOSIS — N184 Chronic kidney disease, stage 4 (severe): Secondary | ICD-10-CM | POA: Diagnosis not present

## 2016-06-10 DIAGNOSIS — N186 End stage renal disease: Secondary | ICD-10-CM | POA: Diagnosis not present

## 2016-06-10 DIAGNOSIS — D509 Iron deficiency anemia, unspecified: Secondary | ICD-10-CM | POA: Diagnosis not present

## 2016-06-10 DIAGNOSIS — N184 Chronic kidney disease, stage 4 (severe): Secondary | ICD-10-CM | POA: Diagnosis not present

## 2016-06-10 DIAGNOSIS — D631 Anemia in chronic kidney disease: Secondary | ICD-10-CM | POA: Diagnosis not present

## 2016-06-10 DIAGNOSIS — N2581 Secondary hyperparathyroidism of renal origin: Secondary | ICD-10-CM | POA: Diagnosis not present

## 2016-06-11 ENCOUNTER — Ambulatory Visit (INDEPENDENT_AMBULATORY_CARE_PROVIDER_SITE_OTHER): Payer: Medicare Other | Admitting: Internal Medicine

## 2016-06-11 ENCOUNTER — Encounter: Payer: Self-pay | Admitting: Internal Medicine

## 2016-06-11 VITALS — BP 98/56 | HR 68 | Ht 69.0 in | Wt 167.0 lb

## 2016-06-11 DIAGNOSIS — I428 Other cardiomyopathies: Secondary | ICD-10-CM | POA: Diagnosis not present

## 2016-06-11 DIAGNOSIS — I10 Essential (primary) hypertension: Secondary | ICD-10-CM

## 2016-06-11 DIAGNOSIS — I350 Nonrheumatic aortic (valve) stenosis: Secondary | ICD-10-CM

## 2016-06-11 DIAGNOSIS — I25810 Atherosclerosis of coronary artery bypass graft(s) without angina pectoris: Secondary | ICD-10-CM | POA: Diagnosis not present

## 2016-06-11 DIAGNOSIS — I429 Cardiomyopathy, unspecified: Secondary | ICD-10-CM | POA: Diagnosis not present

## 2016-06-11 NOTE — Patient Instructions (Signed)
Medication Instructions: - Your physician recommends that you continue on your current medications as directed. Please refer to the Current Medication list given to you today.  Labwork: - none ordered  Procedures/Testing: - Your physician has requested that you have an echocardiogram. Echocardiography is a painless test that uses sound waves to create images of your heart. It provides your doctor with information about the size and shape of your heart and how well your heart's chambers and valves are working. This procedure takes approximately one hour. There are no restrictions for this procedure.  Follow-Up: - Your physician wants you to follow-up in: 6 months with Truitt Merle, NP. You will receive a reminder letter in the mail two months in advance. If you don't receive a letter, please call our office to schedule the follow-up appointment.   Any Additional Special Instructions Will Be Listed Below (If Applicable).     If you need a refill on your cardiac medications before your next appointment, please call your pharmacy.

## 2016-06-11 NOTE — Progress Notes (Signed)
Patient Care Team: Gayland Curry, DO as PCP - General (Geriatric Medicine) Jamal Maes, MD as Consulting Physician (Nephrology)   HPI  Eric [name] is a 83 y.o. male Seen for CAD and s/p AVR   His history of coronary artery disease catheterization 11/16 showed three-vessel disease and he underwent bypass surgery 3 with aortic valve replacement bioprosthesis. LVEF was 35%.  He is on hemodialysis. Echocardiogram 1/17 demonstrated EF 40-5%.  He has chronic LBBB   His biggest complaint is of fatigue that has worsened over the last 6-9 months. It is not associated in mind with shortness of breath chest pain.    Labs are followed by renal    Past Medical History:  Diagnosis Date  . Anxiety   . Arthritis   . CKD (chronic kidney disease) stage 5, GFR less than 15 ml/min (HCC)   . Complication of anesthesia    " one time my heart stopped due to a medication that I was on."   . GERD (gastroesophageal reflux disease)   . Gout   . Heart murmur    as a teen  . Hemodialysis patient [facility])    Tues, Thursday, Saturday  . Herpes zoster 04/2012  . Hypertension   . Shortness of breath dyspnea    with exertion  . Unspecified essential hypertension     Past Surgical History:  Procedure Laterality Date  . AORTIC VALVE REPLACEMENT N/A 12/09/2014   Procedure: AORTIC VALVE REPLACEMENT (AVR) WITH 23MM MAGNA EASE BIOPROSTHETIC VALVE;  Surgeon: Gaye Pollack, MD;  Location: [facility];  Service: Open Heart Surgery;  Laterality: N/A;  . APPENDECTOMY  1944  . AV FISTULA PLACEMENT Left    Left Cimino AVF placed in [place]   . AV FISTULA PLACEMENT Right 03/06/2014   Procedure: ARTERIOVENOUS (AV) FISTULA CREATION-right radiocephalic;  Surgeon: Mal Misty, MD;  Location: [facility] OR;  Service: Vascular;  Laterality: Right;  . AV FISTULA PLACEMENT Right 07/15/2014   Procedure: ARTERIOVENOUS (AV) FISTULA CREATION;  Surgeon: Elam Dutch, MD;  Location: [facility];  Service: Vascular;   Laterality: Right;  . CARDIAC CATHETERIZATION N/A 11/27/2014   Procedure: Right/Left Heart Cath and Coronary Angiography;  Surgeon: Burnell Blanks, MD;  Location: [facility];  Service: Cardiovascular;  Laterality: N/A;  . CATARACT EXTRACTION W/ INTRAOCULAR LENS IMPLANT Bilateral 2014   [facility]  . COLONOSCOPY  2013   Dr. Annamaria Helling [place], [place].  . CORONARY ARTERY BYPASS GRAFT N/A 12/09/2014   Procedure: CORONARY ARTERY BYPASS GRAFTING (CABG), ON PUMP, TIMES THREE, USING LEFT INTERNAL MAMMARY ARTERY, RIGHT GREATER SAPHENOUS VEIN HARVESTED ENDOSCOPICALLY;  Surgeon: Gaye Pollack, MD;  Location: [facility];  Service: Open Heart Surgery;  Laterality: N/A;  LIMA-LAD; SVG-OM; SVG-PD  . DIALYSIS FISTULA CREATION  08/04/2012 and 10/13   Dr. Harden Mo  . EYE SURGERY     Cataract  . INSERTION OF DIALYSIS CATHETER Right 01-15-14   Right chest TDC placed by Dr. [place] Coupe at [facility]  . LIGATION OF ARTERIOVENOUS  FISTULA Right 07/15/2014   Procedure: LIGATION OF ARTERIOVENOUS  FISTULA  (RIGHT RADIOCEPHALIC);  Surgeon: Elam Dutch, MD;  Location: [facility];  Service: Vascular;  Laterality: Right;  . LIGATION OF COMPETING BRANCHES OF ARTERIOVENOUS FISTULA Right 05/08/2014   Procedure: LIGATION OF COMPETING BRANCHES OF RIGHT ARM RADIOCEPHALIC ARTERIOVENOUS FISTULA;  Surgeon: Mal Misty, MD;  Location: [facility];  Service: Vascular;  Laterality: Right;  . REVISON OF  ARTERIOVENOUS FISTULA Right 07/15/2014   Procedure: EXPLORATION OF ARTERIOVENOUS FISTULA;  Surgeon: Elam Dutch, MD;  Location: [facility];  Service: Vascular;  Laterality: Right;  . TEE WITHOUT CARDIOVERSION N/A 12/09/2014   Procedure: TRANSESOPHAGEAL ECHOCARDIOGRAM (TEE);  Surgeon: Gaye Pollack, MD;  Location: [facility];  Service: Open Heart Surgery;  Laterality: N/A;    Current Outpatient Prescriptions  Medication Sig Dispense Refill  . allopurinol (ZYLOPRIM) 100 MG tablet TAKE 1 TABLET BY MOUTH DAILY 90 tablet 1  .  amLODipine (NORVASC) 10 MG tablet TAKE 1 TABLET BY MOUTH DAILY TO CONTROL BLOOD PRESSURE 90 tablet 1  . atorvastatin (LIPITOR) 20 MG tablet TAKE 1 TABLET BY MOUTH DAILY FOR CHOLESTEROL 90 tablet 1  . calcium carbonate (TUMS - DOSED IN MG ELEMENTAL CALCIUM) 500 MG chewable tablet Chew 2 tablets by mouth 3 (three) times daily with meals.    . cinacalcet (SENSIPAR) 30 MG tablet Take 30 mg by mouth daily.    . metoprolol tartrate (LOPRESSOR) 25 MG tablet Take 0.5 tablets (12.5 mg total) by mouth 2 (two) times daily. 31 tablet 8  . omeprazole (PRILOSEC) 20 MG capsule Take 20 mg by mouth 2 (two) times daily before a meal.      No current facility-administered medications for this visit.     No Known Allergies    Review of Systems negative except from HPI and PMH  Physical Exam BP (!) 98/56   Pulse 68   Ht 5\' 9"  (1.753 m)   Wt 167 lb (75.8 kg)   SpO2 97%   BMI 24.66 kg/m  Well developed and well nourished in no acute distress HENT normal E scleral and icterus clear Neck Supple JVP 6-7 carotids brisk and full Clear to ausculation Regular rate and rhythm, 3/6 M  Soft with active bowel sounds No clubbing cyanosis  Edema Alert and oriented, grossly normal motor and sensory function Skin Warm and Dry  Sinus 63 21/[birth year]/49  Assessment and  Plan  LBBB  ICM  S/p CABG--AVR  Hemodialysis  Hypertension     Because of his fatigue is not clear. He had progressive cardiomyopathy. He has left bundle branch block which could aggravate cardiomyopathy. We'll undertake an echocardiogram. His blood pressure is relatively low. This may also be consuming when he says his blood pressure increased recorded here is abnormally low. Once we have the echo information back I will discuss with Dr. Jimmy Footman medication adjustments  We will also be able to reassess the AoV  Without symptoms of ischemia       Current medicines are reviewed at length with the patient today .  The patient does not   have concerns regarding medicines.

## 2016-06-12 DIAGNOSIS — D631 Anemia in chronic kidney disease: Secondary | ICD-10-CM | POA: Diagnosis not present

## 2016-06-12 DIAGNOSIS — N2581 Secondary hyperparathyroidism of renal origin: Secondary | ICD-10-CM | POA: Diagnosis not present

## 2016-06-12 DIAGNOSIS — N184 Chronic kidney disease, stage 4 (severe): Secondary | ICD-10-CM | POA: Diagnosis not present

## 2016-06-12 DIAGNOSIS — D509 Iron deficiency anemia, unspecified: Secondary | ICD-10-CM | POA: Diagnosis not present

## 2016-06-12 DIAGNOSIS — N186 End stage renal disease: Secondary | ICD-10-CM | POA: Diagnosis not present

## 2016-06-15 DIAGNOSIS — N2581 Secondary hyperparathyroidism of renal origin: Secondary | ICD-10-CM | POA: Diagnosis not present

## 2016-06-15 DIAGNOSIS — D631 Anemia in chronic kidney disease: Secondary | ICD-10-CM | POA: Diagnosis not present

## 2016-06-15 DIAGNOSIS — D509 Iron deficiency anemia, unspecified: Secondary | ICD-10-CM | POA: Diagnosis not present

## 2016-06-15 DIAGNOSIS — N184 Chronic kidney disease, stage 4 (severe): Secondary | ICD-10-CM | POA: Diagnosis not present

## 2016-06-15 DIAGNOSIS — N186 End stage renal disease: Secondary | ICD-10-CM | POA: Diagnosis not present

## 2016-06-17 DIAGNOSIS — D631 Anemia in chronic kidney disease: Secondary | ICD-10-CM | POA: Diagnosis not present

## 2016-06-17 DIAGNOSIS — D509 Iron deficiency anemia, unspecified: Secondary | ICD-10-CM | POA: Diagnosis not present

## 2016-06-17 DIAGNOSIS — N184 Chronic kidney disease, stage 4 (severe): Secondary | ICD-10-CM | POA: Diagnosis not present

## 2016-06-17 DIAGNOSIS — N186 End stage renal disease: Secondary | ICD-10-CM | POA: Diagnosis not present

## 2016-06-17 DIAGNOSIS — N2581 Secondary hyperparathyroidism of renal origin: Secondary | ICD-10-CM | POA: Diagnosis not present

## 2016-06-19 DIAGNOSIS — N2581 Secondary hyperparathyroidism of renal origin: Secondary | ICD-10-CM | POA: Diagnosis not present

## 2016-06-19 DIAGNOSIS — N184 Chronic kidney disease, stage 4 (severe): Secondary | ICD-10-CM | POA: Diagnosis not present

## 2016-06-19 DIAGNOSIS — D631 Anemia in chronic kidney disease: Secondary | ICD-10-CM | POA: Diagnosis not present

## 2016-06-19 DIAGNOSIS — N186 End stage renal disease: Secondary | ICD-10-CM | POA: Diagnosis not present

## 2016-06-19 DIAGNOSIS — D509 Iron deficiency anemia, unspecified: Secondary | ICD-10-CM | POA: Diagnosis not present

## 2016-06-22 DIAGNOSIS — D509 Iron deficiency anemia, unspecified: Secondary | ICD-10-CM | POA: Diagnosis not present

## 2016-06-22 DIAGNOSIS — N186 End stage renal disease: Secondary | ICD-10-CM | POA: Diagnosis not present

## 2016-06-22 DIAGNOSIS — N2581 Secondary hyperparathyroidism of renal origin: Secondary | ICD-10-CM | POA: Diagnosis not present

## 2016-06-22 DIAGNOSIS — N184 Chronic kidney disease, stage 4 (severe): Secondary | ICD-10-CM | POA: Diagnosis not present

## 2016-06-22 DIAGNOSIS — D631 Anemia in chronic kidney disease: Secondary | ICD-10-CM | POA: Diagnosis not present

## 2016-06-23 ENCOUNTER — Encounter: Payer: Self-pay | Admitting: Gastroenterology

## 2016-06-24 DIAGNOSIS — N184 Chronic kidney disease, stage 4 (severe): Secondary | ICD-10-CM | POA: Diagnosis not present

## 2016-06-24 DIAGNOSIS — D631 Anemia in chronic kidney disease: Secondary | ICD-10-CM | POA: Diagnosis not present

## 2016-06-24 DIAGNOSIS — N186 End stage renal disease: Secondary | ICD-10-CM | POA: Diagnosis not present

## 2016-06-24 DIAGNOSIS — N2581 Secondary hyperparathyroidism of renal origin: Secondary | ICD-10-CM | POA: Diagnosis not present

## 2016-06-24 DIAGNOSIS — D509 Iron deficiency anemia, unspecified: Secondary | ICD-10-CM | POA: Diagnosis not present

## 2016-06-25 ENCOUNTER — Other Ambulatory Visit: Payer: Self-pay

## 2016-06-25 ENCOUNTER — Ambulatory Visit (HOSPITAL_COMMUNITY): Payer: Medicare Other | Attending: Cardiovascular Disease

## 2016-06-25 DIAGNOSIS — I428 Other cardiomyopathies: Secondary | ICD-10-CM | POA: Diagnosis not present

## 2016-06-25 DIAGNOSIS — I080 Rheumatic disorders of both mitral and aortic valves: Secondary | ICD-10-CM | POA: Diagnosis not present

## 2016-06-25 MED ORDER — PERFLUTREN LIPID MICROSPHERE
1.0000 mL | INTRAVENOUS | Status: AC | PRN
  Administered 2016-06-25: 2 mL via INTRAVENOUS

## 2016-06-26 DIAGNOSIS — N184 Chronic kidney disease, stage 4 (severe): Secondary | ICD-10-CM | POA: Diagnosis not present

## 2016-06-26 DIAGNOSIS — N2581 Secondary hyperparathyroidism of renal origin: Secondary | ICD-10-CM | POA: Diagnosis not present

## 2016-06-26 DIAGNOSIS — N186 End stage renal disease: Secondary | ICD-10-CM | POA: Diagnosis not present

## 2016-06-26 DIAGNOSIS — D509 Iron deficiency anemia, unspecified: Secondary | ICD-10-CM | POA: Diagnosis not present

## 2016-06-26 DIAGNOSIS — D631 Anemia in chronic kidney disease: Secondary | ICD-10-CM | POA: Diagnosis not present

## 2016-06-29 DIAGNOSIS — N184 Chronic kidney disease, stage 4 (severe): Secondary | ICD-10-CM | POA: Diagnosis not present

## 2016-06-29 DIAGNOSIS — D631 Anemia in chronic kidney disease: Secondary | ICD-10-CM | POA: Diagnosis not present

## 2016-06-29 DIAGNOSIS — N186 End stage renal disease: Secondary | ICD-10-CM | POA: Diagnosis not present

## 2016-06-29 DIAGNOSIS — D509 Iron deficiency anemia, unspecified: Secondary | ICD-10-CM | POA: Diagnosis not present

## 2016-06-29 DIAGNOSIS — N2581 Secondary hyperparathyroidism of renal origin: Secondary | ICD-10-CM | POA: Diagnosis not present

## 2016-07-01 DIAGNOSIS — N2581 Secondary hyperparathyroidism of renal origin: Secondary | ICD-10-CM | POA: Diagnosis not present

## 2016-07-01 DIAGNOSIS — N184 Chronic kidney disease, stage 4 (severe): Secondary | ICD-10-CM | POA: Diagnosis not present

## 2016-07-01 DIAGNOSIS — D509 Iron deficiency anemia, unspecified: Secondary | ICD-10-CM | POA: Diagnosis not present

## 2016-07-01 DIAGNOSIS — N186 End stage renal disease: Secondary | ICD-10-CM | POA: Diagnosis not present

## 2016-07-01 DIAGNOSIS — D631 Anemia in chronic kidney disease: Secondary | ICD-10-CM | POA: Diagnosis not present

## 2016-07-03 DIAGNOSIS — D509 Iron deficiency anemia, unspecified: Secondary | ICD-10-CM | POA: Diagnosis not present

## 2016-07-03 DIAGNOSIS — N186 End stage renal disease: Secondary | ICD-10-CM | POA: Diagnosis not present

## 2016-07-03 DIAGNOSIS — N184 Chronic kidney disease, stage 4 (severe): Secondary | ICD-10-CM | POA: Diagnosis not present

## 2016-07-03 DIAGNOSIS — D631 Anemia in chronic kidney disease: Secondary | ICD-10-CM | POA: Diagnosis not present

## 2016-07-03 DIAGNOSIS — N2889 Other specified disorders of kidney and ureter: Secondary | ICD-10-CM | POA: Diagnosis not present

## 2016-07-03 DIAGNOSIS — Z992 Dependence on renal dialysis: Secondary | ICD-10-CM | POA: Diagnosis not present

## 2016-07-03 DIAGNOSIS — N2581 Secondary hyperparathyroidism of renal origin: Secondary | ICD-10-CM | POA: Diagnosis not present

## 2016-07-06 DIAGNOSIS — N2581 Secondary hyperparathyroidism of renal origin: Secondary | ICD-10-CM | POA: Diagnosis not present

## 2016-07-06 DIAGNOSIS — D631 Anemia in chronic kidney disease: Secondary | ICD-10-CM | POA: Diagnosis not present

## 2016-07-06 DIAGNOSIS — D509 Iron deficiency anemia, unspecified: Secondary | ICD-10-CM | POA: Diagnosis not present

## 2016-07-06 DIAGNOSIS — N184 Chronic kidney disease, stage 4 (severe): Secondary | ICD-10-CM | POA: Diagnosis not present

## 2016-07-06 DIAGNOSIS — N186 End stage renal disease: Secondary | ICD-10-CM | POA: Diagnosis not present

## 2016-07-08 DIAGNOSIS — N186 End stage renal disease: Secondary | ICD-10-CM | POA: Diagnosis not present

## 2016-07-08 DIAGNOSIS — N2581 Secondary hyperparathyroidism of renal origin: Secondary | ICD-10-CM | POA: Diagnosis not present

## 2016-07-08 DIAGNOSIS — N184 Chronic kidney disease, stage 4 (severe): Secondary | ICD-10-CM | POA: Diagnosis not present

## 2016-07-08 DIAGNOSIS — D631 Anemia in chronic kidney disease: Secondary | ICD-10-CM | POA: Diagnosis not present

## 2016-07-08 DIAGNOSIS — D509 Iron deficiency anemia, unspecified: Secondary | ICD-10-CM | POA: Diagnosis not present

## 2016-07-10 DIAGNOSIS — N2581 Secondary hyperparathyroidism of renal origin: Secondary | ICD-10-CM | POA: Diagnosis not present

## 2016-07-10 DIAGNOSIS — N186 End stage renal disease: Secondary | ICD-10-CM | POA: Diagnosis not present

## 2016-07-10 DIAGNOSIS — D509 Iron deficiency anemia, unspecified: Secondary | ICD-10-CM | POA: Diagnosis not present

## 2016-07-10 DIAGNOSIS — N184 Chronic kidney disease, stage 4 (severe): Secondary | ICD-10-CM | POA: Diagnosis not present

## 2016-07-10 DIAGNOSIS — D631 Anemia in chronic kidney disease: Secondary | ICD-10-CM | POA: Diagnosis not present

## 2016-07-13 DIAGNOSIS — D509 Iron deficiency anemia, unspecified: Secondary | ICD-10-CM | POA: Diagnosis not present

## 2016-07-13 DIAGNOSIS — N184 Chronic kidney disease, stage 4 (severe): Secondary | ICD-10-CM | POA: Diagnosis not present

## 2016-07-13 DIAGNOSIS — N186 End stage renal disease: Secondary | ICD-10-CM | POA: Diagnosis not present

## 2016-07-13 DIAGNOSIS — N2581 Secondary hyperparathyroidism of renal origin: Secondary | ICD-10-CM | POA: Diagnosis not present

## 2016-07-13 DIAGNOSIS — D631 Anemia in chronic kidney disease: Secondary | ICD-10-CM | POA: Diagnosis not present

## 2016-07-15 DIAGNOSIS — N186 End stage renal disease: Secondary | ICD-10-CM | POA: Diagnosis not present

## 2016-07-15 DIAGNOSIS — D509 Iron deficiency anemia, unspecified: Secondary | ICD-10-CM | POA: Diagnosis not present

## 2016-07-15 DIAGNOSIS — N2581 Secondary hyperparathyroidism of renal origin: Secondary | ICD-10-CM | POA: Diagnosis not present

## 2016-07-15 DIAGNOSIS — N184 Chronic kidney disease, stage 4 (severe): Secondary | ICD-10-CM | POA: Diagnosis not present

## 2016-07-15 DIAGNOSIS — D631 Anemia in chronic kidney disease: Secondary | ICD-10-CM | POA: Diagnosis not present

## 2016-07-17 DIAGNOSIS — D509 Iron deficiency anemia, unspecified: Secondary | ICD-10-CM | POA: Diagnosis not present

## 2016-07-17 DIAGNOSIS — N184 Chronic kidney disease, stage 4 (severe): Secondary | ICD-10-CM | POA: Diagnosis not present

## 2016-07-17 DIAGNOSIS — N2581 Secondary hyperparathyroidism of renal origin: Secondary | ICD-10-CM | POA: Diagnosis not present

## 2016-07-17 DIAGNOSIS — N186 End stage renal disease: Secondary | ICD-10-CM | POA: Diagnosis not present

## 2016-07-17 DIAGNOSIS — D631 Anemia in chronic kidney disease: Secondary | ICD-10-CM | POA: Diagnosis not present

## 2016-07-20 DIAGNOSIS — D631 Anemia in chronic kidney disease: Secondary | ICD-10-CM | POA: Diagnosis not present

## 2016-07-20 DIAGNOSIS — N2581 Secondary hyperparathyroidism of renal origin: Secondary | ICD-10-CM | POA: Diagnosis not present

## 2016-07-20 DIAGNOSIS — N184 Chronic kidney disease, stage 4 (severe): Secondary | ICD-10-CM | POA: Diagnosis not present

## 2016-07-20 DIAGNOSIS — D509 Iron deficiency anemia, unspecified: Secondary | ICD-10-CM | POA: Diagnosis not present

## 2016-07-20 DIAGNOSIS — N186 End stage renal disease: Secondary | ICD-10-CM | POA: Diagnosis not present

## 2016-07-22 DIAGNOSIS — D631 Anemia in chronic kidney disease: Secondary | ICD-10-CM | POA: Diagnosis not present

## 2016-07-22 DIAGNOSIS — N2581 Secondary hyperparathyroidism of renal origin: Secondary | ICD-10-CM | POA: Diagnosis not present

## 2016-07-22 DIAGNOSIS — D509 Iron deficiency anemia, unspecified: Secondary | ICD-10-CM | POA: Diagnosis not present

## 2016-07-22 DIAGNOSIS — N186 End stage renal disease: Secondary | ICD-10-CM | POA: Diagnosis not present

## 2016-07-22 DIAGNOSIS — N184 Chronic kidney disease, stage 4 (severe): Secondary | ICD-10-CM | POA: Diagnosis not present

## 2016-07-24 DIAGNOSIS — D631 Anemia in chronic kidney disease: Secondary | ICD-10-CM | POA: Diagnosis not present

## 2016-07-24 DIAGNOSIS — N186 End stage renal disease: Secondary | ICD-10-CM | POA: Diagnosis not present

## 2016-07-24 DIAGNOSIS — N184 Chronic kidney disease, stage 4 (severe): Secondary | ICD-10-CM | POA: Diagnosis not present

## 2016-07-24 DIAGNOSIS — D509 Iron deficiency anemia, unspecified: Secondary | ICD-10-CM | POA: Diagnosis not present

## 2016-07-24 DIAGNOSIS — N2581 Secondary hyperparathyroidism of renal origin: Secondary | ICD-10-CM | POA: Diagnosis not present

## 2016-07-27 DIAGNOSIS — D509 Iron deficiency anemia, unspecified: Secondary | ICD-10-CM | POA: Diagnosis not present

## 2016-07-27 DIAGNOSIS — N186 End stage renal disease: Secondary | ICD-10-CM | POA: Diagnosis not present

## 2016-07-27 DIAGNOSIS — N2581 Secondary hyperparathyroidism of renal origin: Secondary | ICD-10-CM | POA: Diagnosis not present

## 2016-07-27 DIAGNOSIS — D631 Anemia in chronic kidney disease: Secondary | ICD-10-CM | POA: Diagnosis not present

## 2016-07-27 DIAGNOSIS — N184 Chronic kidney disease, stage 4 (severe): Secondary | ICD-10-CM | POA: Diagnosis not present

## 2016-07-29 DIAGNOSIS — N186 End stage renal disease: Secondary | ICD-10-CM | POA: Diagnosis not present

## 2016-07-29 DIAGNOSIS — D509 Iron deficiency anemia, unspecified: Secondary | ICD-10-CM | POA: Diagnosis not present

## 2016-07-29 DIAGNOSIS — N2581 Secondary hyperparathyroidism of renal origin: Secondary | ICD-10-CM | POA: Diagnosis not present

## 2016-07-29 DIAGNOSIS — D631 Anemia in chronic kidney disease: Secondary | ICD-10-CM | POA: Diagnosis not present

## 2016-07-29 DIAGNOSIS — N184 Chronic kidney disease, stage 4 (severe): Secondary | ICD-10-CM | POA: Diagnosis not present

## 2016-07-31 DIAGNOSIS — N186 End stage renal disease: Secondary | ICD-10-CM | POA: Diagnosis not present

## 2016-07-31 DIAGNOSIS — D509 Iron deficiency anemia, unspecified: Secondary | ICD-10-CM | POA: Diagnosis not present

## 2016-07-31 DIAGNOSIS — N184 Chronic kidney disease, stage 4 (severe): Secondary | ICD-10-CM | POA: Diagnosis not present

## 2016-07-31 DIAGNOSIS — D631 Anemia in chronic kidney disease: Secondary | ICD-10-CM | POA: Diagnosis not present

## 2016-07-31 DIAGNOSIS — N2581 Secondary hyperparathyroidism of renal origin: Secondary | ICD-10-CM | POA: Diagnosis not present

## 2016-08-03 DIAGNOSIS — D631 Anemia in chronic kidney disease: Secondary | ICD-10-CM | POA: Diagnosis not present

## 2016-08-03 DIAGNOSIS — N2889 Other specified disorders of kidney and ureter: Secondary | ICD-10-CM | POA: Diagnosis not present

## 2016-08-03 DIAGNOSIS — D509 Iron deficiency anemia, unspecified: Secondary | ICD-10-CM | POA: Diagnosis not present

## 2016-08-03 DIAGNOSIS — N186 End stage renal disease: Secondary | ICD-10-CM | POA: Diagnosis not present

## 2016-08-03 DIAGNOSIS — N184 Chronic kidney disease, stage 4 (severe): Secondary | ICD-10-CM | POA: Diagnosis not present

## 2016-08-03 DIAGNOSIS — N2581 Secondary hyperparathyroidism of renal origin: Secondary | ICD-10-CM | POA: Diagnosis not present

## 2016-08-03 DIAGNOSIS — Z992 Dependence on renal dialysis: Secondary | ICD-10-CM | POA: Diagnosis not present

## 2016-08-05 DIAGNOSIS — N186 End stage renal disease: Secondary | ICD-10-CM | POA: Diagnosis not present

## 2016-08-05 DIAGNOSIS — D509 Iron deficiency anemia, unspecified: Secondary | ICD-10-CM | POA: Diagnosis not present

## 2016-08-05 DIAGNOSIS — N2581 Secondary hyperparathyroidism of renal origin: Secondary | ICD-10-CM | POA: Diagnosis not present

## 2016-08-05 DIAGNOSIS — N184 Chronic kidney disease, stage 4 (severe): Secondary | ICD-10-CM | POA: Diagnosis not present

## 2016-08-05 DIAGNOSIS — D631 Anemia in chronic kidney disease: Secondary | ICD-10-CM | POA: Diagnosis not present

## 2016-08-07 DIAGNOSIS — N184 Chronic kidney disease, stage 4 (severe): Secondary | ICD-10-CM | POA: Diagnosis not present

## 2016-08-07 DIAGNOSIS — N2581 Secondary hyperparathyroidism of renal origin: Secondary | ICD-10-CM | POA: Diagnosis not present

## 2016-08-07 DIAGNOSIS — N186 End stage renal disease: Secondary | ICD-10-CM | POA: Diagnosis not present

## 2016-08-07 DIAGNOSIS — D509 Iron deficiency anemia, unspecified: Secondary | ICD-10-CM | POA: Diagnosis not present

## 2016-08-07 DIAGNOSIS — D631 Anemia in chronic kidney disease: Secondary | ICD-10-CM | POA: Diagnosis not present

## 2016-08-09 ENCOUNTER — Other Ambulatory Visit: Payer: Self-pay | Admitting: Internal Medicine

## 2016-08-10 DIAGNOSIS — N2581 Secondary hyperparathyroidism of renal origin: Secondary | ICD-10-CM | POA: Diagnosis not present

## 2016-08-10 DIAGNOSIS — N186 End stage renal disease: Secondary | ICD-10-CM | POA: Diagnosis not present

## 2016-08-10 DIAGNOSIS — D631 Anemia in chronic kidney disease: Secondary | ICD-10-CM | POA: Diagnosis not present

## 2016-08-10 DIAGNOSIS — D509 Iron deficiency anemia, unspecified: Secondary | ICD-10-CM | POA: Diagnosis not present

## 2016-08-10 DIAGNOSIS — N184 Chronic kidney disease, stage 4 (severe): Secondary | ICD-10-CM | POA: Diagnosis not present

## 2016-08-12 DIAGNOSIS — D509 Iron deficiency anemia, unspecified: Secondary | ICD-10-CM | POA: Diagnosis not present

## 2016-08-12 DIAGNOSIS — N184 Chronic kidney disease, stage 4 (severe): Secondary | ICD-10-CM | POA: Diagnosis not present

## 2016-08-12 DIAGNOSIS — N186 End stage renal disease: Secondary | ICD-10-CM | POA: Diagnosis not present

## 2016-08-12 DIAGNOSIS — N2581 Secondary hyperparathyroidism of renal origin: Secondary | ICD-10-CM | POA: Diagnosis not present

## 2016-08-12 DIAGNOSIS — D631 Anemia in chronic kidney disease: Secondary | ICD-10-CM | POA: Diagnosis not present

## 2016-08-14 DIAGNOSIS — N2581 Secondary hyperparathyroidism of renal origin: Secondary | ICD-10-CM | POA: Diagnosis not present

## 2016-08-14 DIAGNOSIS — N184 Chronic kidney disease, stage 4 (severe): Secondary | ICD-10-CM | POA: Diagnosis not present

## 2016-08-14 DIAGNOSIS — D509 Iron deficiency anemia, unspecified: Secondary | ICD-10-CM | POA: Diagnosis not present

## 2016-08-14 DIAGNOSIS — N186 End stage renal disease: Secondary | ICD-10-CM | POA: Diagnosis not present

## 2016-08-14 DIAGNOSIS — D631 Anemia in chronic kidney disease: Secondary | ICD-10-CM | POA: Diagnosis not present

## 2016-08-17 DIAGNOSIS — D509 Iron deficiency anemia, unspecified: Secondary | ICD-10-CM | POA: Diagnosis not present

## 2016-08-17 DIAGNOSIS — N186 End stage renal disease: Secondary | ICD-10-CM | POA: Diagnosis not present

## 2016-08-17 DIAGNOSIS — N184 Chronic kidney disease, stage 4 (severe): Secondary | ICD-10-CM | POA: Diagnosis not present

## 2016-08-17 DIAGNOSIS — N2581 Secondary hyperparathyroidism of renal origin: Secondary | ICD-10-CM | POA: Diagnosis not present

## 2016-08-17 DIAGNOSIS — D631 Anemia in chronic kidney disease: Secondary | ICD-10-CM | POA: Diagnosis not present

## 2016-08-19 DIAGNOSIS — N2581 Secondary hyperparathyroidism of renal origin: Secondary | ICD-10-CM | POA: Diagnosis not present

## 2016-08-19 DIAGNOSIS — N184 Chronic kidney disease, stage 4 (severe): Secondary | ICD-10-CM | POA: Diagnosis not present

## 2016-08-19 DIAGNOSIS — D631 Anemia in chronic kidney disease: Secondary | ICD-10-CM | POA: Diagnosis not present

## 2016-08-19 DIAGNOSIS — D509 Iron deficiency anemia, unspecified: Secondary | ICD-10-CM | POA: Diagnosis not present

## 2016-08-19 DIAGNOSIS — N186 End stage renal disease: Secondary | ICD-10-CM | POA: Diagnosis not present

## 2016-08-21 DIAGNOSIS — D509 Iron deficiency anemia, unspecified: Secondary | ICD-10-CM | POA: Diagnosis not present

## 2016-08-21 DIAGNOSIS — N2581 Secondary hyperparathyroidism of renal origin: Secondary | ICD-10-CM | POA: Diagnosis not present

## 2016-08-21 DIAGNOSIS — N184 Chronic kidney disease, stage 4 (severe): Secondary | ICD-10-CM | POA: Diagnosis not present

## 2016-08-21 DIAGNOSIS — N186 End stage renal disease: Secondary | ICD-10-CM | POA: Diagnosis not present

## 2016-08-21 DIAGNOSIS — D631 Anemia in chronic kidney disease: Secondary | ICD-10-CM | POA: Diagnosis not present

## 2016-08-24 DIAGNOSIS — N184 Chronic kidney disease, stage 4 (severe): Secondary | ICD-10-CM | POA: Diagnosis not present

## 2016-08-24 DIAGNOSIS — N2581 Secondary hyperparathyroidism of renal origin: Secondary | ICD-10-CM | POA: Diagnosis not present

## 2016-08-24 DIAGNOSIS — D631 Anemia in chronic kidney disease: Secondary | ICD-10-CM | POA: Diagnosis not present

## 2016-08-24 DIAGNOSIS — D509 Iron deficiency anemia, unspecified: Secondary | ICD-10-CM | POA: Diagnosis not present

## 2016-08-24 DIAGNOSIS — N186 End stage renal disease: Secondary | ICD-10-CM | POA: Diagnosis not present

## 2016-08-26 DIAGNOSIS — N184 Chronic kidney disease, stage 4 (severe): Secondary | ICD-10-CM | POA: Diagnosis not present

## 2016-08-26 DIAGNOSIS — D631 Anemia in chronic kidney disease: Secondary | ICD-10-CM | POA: Diagnosis not present

## 2016-08-26 DIAGNOSIS — N186 End stage renal disease: Secondary | ICD-10-CM | POA: Diagnosis not present

## 2016-08-26 DIAGNOSIS — N2581 Secondary hyperparathyroidism of renal origin: Secondary | ICD-10-CM | POA: Diagnosis not present

## 2016-08-26 DIAGNOSIS — D509 Iron deficiency anemia, unspecified: Secondary | ICD-10-CM | POA: Diagnosis not present

## 2016-08-27 DIAGNOSIS — T82858A Stenosis of vascular prosthetic devices, implants and grafts, initial encounter: Secondary | ICD-10-CM | POA: Diagnosis not present

## 2016-08-27 DIAGNOSIS — N186 End stage renal disease: Secondary | ICD-10-CM | POA: Diagnosis not present

## 2016-08-27 DIAGNOSIS — I871 Compression of vein: Secondary | ICD-10-CM | POA: Diagnosis not present

## 2016-08-27 DIAGNOSIS — Z992 Dependence on renal dialysis: Secondary | ICD-10-CM | POA: Diagnosis not present

## 2016-08-28 DIAGNOSIS — N184 Chronic kidney disease, stage 4 (severe): Secondary | ICD-10-CM | POA: Diagnosis not present

## 2016-08-28 DIAGNOSIS — N186 End stage renal disease: Secondary | ICD-10-CM | POA: Diagnosis not present

## 2016-08-28 DIAGNOSIS — D509 Iron deficiency anemia, unspecified: Secondary | ICD-10-CM | POA: Diagnosis not present

## 2016-08-28 DIAGNOSIS — D631 Anemia in chronic kidney disease: Secondary | ICD-10-CM | POA: Diagnosis not present

## 2016-08-28 DIAGNOSIS — N2581 Secondary hyperparathyroidism of renal origin: Secondary | ICD-10-CM | POA: Diagnosis not present

## 2016-08-31 DIAGNOSIS — N186 End stage renal disease: Secondary | ICD-10-CM | POA: Diagnosis not present

## 2016-08-31 DIAGNOSIS — D509 Iron deficiency anemia, unspecified: Secondary | ICD-10-CM | POA: Diagnosis not present

## 2016-08-31 DIAGNOSIS — D631 Anemia in chronic kidney disease: Secondary | ICD-10-CM | POA: Diagnosis not present

## 2016-08-31 DIAGNOSIS — N2581 Secondary hyperparathyroidism of renal origin: Secondary | ICD-10-CM | POA: Diagnosis not present

## 2016-08-31 DIAGNOSIS — N184 Chronic kidney disease, stage 4 (severe): Secondary | ICD-10-CM | POA: Diagnosis not present

## 2016-09-02 DIAGNOSIS — N186 End stage renal disease: Secondary | ICD-10-CM | POA: Diagnosis not present

## 2016-09-02 DIAGNOSIS — N184 Chronic kidney disease, stage 4 (severe): Secondary | ICD-10-CM | POA: Diagnosis not present

## 2016-09-02 DIAGNOSIS — D509 Iron deficiency anemia, unspecified: Secondary | ICD-10-CM | POA: Diagnosis not present

## 2016-09-02 DIAGNOSIS — N2581 Secondary hyperparathyroidism of renal origin: Secondary | ICD-10-CM | POA: Diagnosis not present

## 2016-09-02 DIAGNOSIS — D631 Anemia in chronic kidney disease: Secondary | ICD-10-CM | POA: Diagnosis not present

## 2016-09-03 DIAGNOSIS — N186 End stage renal disease: Secondary | ICD-10-CM | POA: Diagnosis not present

## 2016-09-03 DIAGNOSIS — Z992 Dependence on renal dialysis: Secondary | ICD-10-CM | POA: Diagnosis not present

## 2016-09-03 DIAGNOSIS — N2889 Other specified disorders of kidney and ureter: Secondary | ICD-10-CM | POA: Diagnosis not present

## 2016-09-04 DIAGNOSIS — Z23 Encounter for immunization: Secondary | ICD-10-CM | POA: Diagnosis not present

## 2016-09-04 DIAGNOSIS — N186 End stage renal disease: Secondary | ICD-10-CM | POA: Diagnosis not present

## 2016-09-04 DIAGNOSIS — N184 Chronic kidney disease, stage 4 (severe): Secondary | ICD-10-CM | POA: Diagnosis not present

## 2016-09-04 DIAGNOSIS — N2581 Secondary hyperparathyroidism of renal origin: Secondary | ICD-10-CM | POA: Diagnosis not present

## 2016-09-04 DIAGNOSIS — D509 Iron deficiency anemia, unspecified: Secondary | ICD-10-CM | POA: Diagnosis not present

## 2016-09-04 DIAGNOSIS — D631 Anemia in chronic kidney disease: Secondary | ICD-10-CM | POA: Diagnosis not present

## 2016-09-07 DIAGNOSIS — D509 Iron deficiency anemia, unspecified: Secondary | ICD-10-CM | POA: Diagnosis not present

## 2016-09-07 DIAGNOSIS — D631 Anemia in chronic kidney disease: Secondary | ICD-10-CM | POA: Diagnosis not present

## 2016-09-07 DIAGNOSIS — N2581 Secondary hyperparathyroidism of renal origin: Secondary | ICD-10-CM | POA: Diagnosis not present

## 2016-09-07 DIAGNOSIS — N186 End stage renal disease: Secondary | ICD-10-CM | POA: Diagnosis not present

## 2016-09-07 DIAGNOSIS — Z23 Encounter for immunization: Secondary | ICD-10-CM | POA: Diagnosis not present

## 2016-09-07 DIAGNOSIS — N184 Chronic kidney disease, stage 4 (severe): Secondary | ICD-10-CM | POA: Diagnosis not present

## 2016-09-09 DIAGNOSIS — Z23 Encounter for immunization: Secondary | ICD-10-CM | POA: Diagnosis not present

## 2016-09-09 DIAGNOSIS — N184 Chronic kidney disease, stage 4 (severe): Secondary | ICD-10-CM | POA: Diagnosis not present

## 2016-09-09 DIAGNOSIS — N2581 Secondary hyperparathyroidism of renal origin: Secondary | ICD-10-CM | POA: Diagnosis not present

## 2016-09-09 DIAGNOSIS — D509 Iron deficiency anemia, unspecified: Secondary | ICD-10-CM | POA: Diagnosis not present

## 2016-09-09 DIAGNOSIS — N186 End stage renal disease: Secondary | ICD-10-CM | POA: Diagnosis not present

## 2016-09-09 DIAGNOSIS — D631 Anemia in chronic kidney disease: Secondary | ICD-10-CM | POA: Diagnosis not present

## 2016-09-11 DIAGNOSIS — N2581 Secondary hyperparathyroidism of renal origin: Secondary | ICD-10-CM | POA: Diagnosis not present

## 2016-09-11 DIAGNOSIS — N184 Chronic kidney disease, stage 4 (severe): Secondary | ICD-10-CM | POA: Diagnosis not present

## 2016-09-11 DIAGNOSIS — N186 End stage renal disease: Secondary | ICD-10-CM | POA: Diagnosis not present

## 2016-09-11 DIAGNOSIS — D509 Iron deficiency anemia, unspecified: Secondary | ICD-10-CM | POA: Diagnosis not present

## 2016-09-11 DIAGNOSIS — Z23 Encounter for immunization: Secondary | ICD-10-CM | POA: Diagnosis not present

## 2016-09-11 DIAGNOSIS — D631 Anemia in chronic kidney disease: Secondary | ICD-10-CM | POA: Diagnosis not present

## 2016-09-14 DIAGNOSIS — N2581 Secondary hyperparathyroidism of renal origin: Secondary | ICD-10-CM | POA: Diagnosis not present

## 2016-09-14 DIAGNOSIS — N184 Chronic kidney disease, stage 4 (severe): Secondary | ICD-10-CM | POA: Diagnosis not present

## 2016-09-14 DIAGNOSIS — D631 Anemia in chronic kidney disease: Secondary | ICD-10-CM | POA: Diagnosis not present

## 2016-09-14 DIAGNOSIS — D509 Iron deficiency anemia, unspecified: Secondary | ICD-10-CM | POA: Diagnosis not present

## 2016-09-14 DIAGNOSIS — Z23 Encounter for immunization: Secondary | ICD-10-CM | POA: Diagnosis not present

## 2016-09-14 DIAGNOSIS — N186 End stage renal disease: Secondary | ICD-10-CM | POA: Diagnosis not present

## 2016-09-16 DIAGNOSIS — N186 End stage renal disease: Secondary | ICD-10-CM | POA: Diagnosis not present

## 2016-09-16 DIAGNOSIS — D631 Anemia in chronic kidney disease: Secondary | ICD-10-CM | POA: Diagnosis not present

## 2016-09-16 DIAGNOSIS — N184 Chronic kidney disease, stage 4 (severe): Secondary | ICD-10-CM | POA: Diagnosis not present

## 2016-09-16 DIAGNOSIS — D509 Iron deficiency anemia, unspecified: Secondary | ICD-10-CM | POA: Diagnosis not present

## 2016-09-16 DIAGNOSIS — N2581 Secondary hyperparathyroidism of renal origin: Secondary | ICD-10-CM | POA: Diagnosis not present

## 2016-09-16 DIAGNOSIS — Z23 Encounter for immunization: Secondary | ICD-10-CM | POA: Diagnosis not present

## 2016-09-18 DIAGNOSIS — D631 Anemia in chronic kidney disease: Secondary | ICD-10-CM | POA: Diagnosis not present

## 2016-09-18 DIAGNOSIS — D509 Iron deficiency anemia, unspecified: Secondary | ICD-10-CM | POA: Diagnosis not present

## 2016-09-18 DIAGNOSIS — N2581 Secondary hyperparathyroidism of renal origin: Secondary | ICD-10-CM | POA: Diagnosis not present

## 2016-09-18 DIAGNOSIS — Z23 Encounter for immunization: Secondary | ICD-10-CM | POA: Diagnosis not present

## 2016-09-18 DIAGNOSIS — N186 End stage renal disease: Secondary | ICD-10-CM | POA: Diagnosis not present

## 2016-09-18 DIAGNOSIS — N184 Chronic kidney disease, stage 4 (severe): Secondary | ICD-10-CM | POA: Diagnosis not present

## 2016-09-21 DIAGNOSIS — N186 End stage renal disease: Secondary | ICD-10-CM | POA: Diagnosis not present

## 2016-09-21 DIAGNOSIS — N184 Chronic kidney disease, stage 4 (severe): Secondary | ICD-10-CM | POA: Diagnosis not present

## 2016-09-21 DIAGNOSIS — D631 Anemia in chronic kidney disease: Secondary | ICD-10-CM | POA: Diagnosis not present

## 2016-09-21 DIAGNOSIS — D509 Iron deficiency anemia, unspecified: Secondary | ICD-10-CM | POA: Diagnosis not present

## 2016-09-21 DIAGNOSIS — Z23 Encounter for immunization: Secondary | ICD-10-CM | POA: Diagnosis not present

## 2016-09-21 DIAGNOSIS — N2581 Secondary hyperparathyroidism of renal origin: Secondary | ICD-10-CM | POA: Diagnosis not present

## 2016-09-23 DIAGNOSIS — Z23 Encounter for immunization: Secondary | ICD-10-CM | POA: Diagnosis not present

## 2016-09-23 DIAGNOSIS — N186 End stage renal disease: Secondary | ICD-10-CM | POA: Diagnosis not present

## 2016-09-23 DIAGNOSIS — N2581 Secondary hyperparathyroidism of renal origin: Secondary | ICD-10-CM | POA: Diagnosis not present

## 2016-09-23 DIAGNOSIS — N184 Chronic kidney disease, stage 4 (severe): Secondary | ICD-10-CM | POA: Diagnosis not present

## 2016-09-23 DIAGNOSIS — D631 Anemia in chronic kidney disease: Secondary | ICD-10-CM | POA: Diagnosis not present

## 2016-09-23 DIAGNOSIS — D509 Iron deficiency anemia, unspecified: Secondary | ICD-10-CM | POA: Diagnosis not present

## 2016-09-25 DIAGNOSIS — N184 Chronic kidney disease, stage 4 (severe): Secondary | ICD-10-CM | POA: Diagnosis not present

## 2016-09-25 DIAGNOSIS — Z23 Encounter for immunization: Secondary | ICD-10-CM | POA: Diagnosis not present

## 2016-09-25 DIAGNOSIS — N2581 Secondary hyperparathyroidism of renal origin: Secondary | ICD-10-CM | POA: Diagnosis not present

## 2016-09-25 DIAGNOSIS — D509 Iron deficiency anemia, unspecified: Secondary | ICD-10-CM | POA: Diagnosis not present

## 2016-09-25 DIAGNOSIS — N186 End stage renal disease: Secondary | ICD-10-CM | POA: Diagnosis not present

## 2016-09-25 DIAGNOSIS — D631 Anemia in chronic kidney disease: Secondary | ICD-10-CM | POA: Diagnosis not present

## 2016-09-28 DIAGNOSIS — Z23 Encounter for immunization: Secondary | ICD-10-CM | POA: Diagnosis not present

## 2016-09-28 DIAGNOSIS — D509 Iron deficiency anemia, unspecified: Secondary | ICD-10-CM | POA: Diagnosis not present

## 2016-09-28 DIAGNOSIS — N186 End stage renal disease: Secondary | ICD-10-CM | POA: Diagnosis not present

## 2016-09-28 DIAGNOSIS — D631 Anemia in chronic kidney disease: Secondary | ICD-10-CM | POA: Diagnosis not present

## 2016-09-28 DIAGNOSIS — N2581 Secondary hyperparathyroidism of renal origin: Secondary | ICD-10-CM | POA: Diagnosis not present

## 2016-09-28 DIAGNOSIS — N184 Chronic kidney disease, stage 4 (severe): Secondary | ICD-10-CM | POA: Diagnosis not present

## 2016-09-30 DIAGNOSIS — Z23 Encounter for immunization: Secondary | ICD-10-CM | POA: Diagnosis not present

## 2016-09-30 DIAGNOSIS — N184 Chronic kidney disease, stage 4 (severe): Secondary | ICD-10-CM | POA: Diagnosis not present

## 2016-09-30 DIAGNOSIS — D631 Anemia in chronic kidney disease: Secondary | ICD-10-CM | POA: Diagnosis not present

## 2016-09-30 DIAGNOSIS — N2581 Secondary hyperparathyroidism of renal origin: Secondary | ICD-10-CM | POA: Diagnosis not present

## 2016-09-30 DIAGNOSIS — D509 Iron deficiency anemia, unspecified: Secondary | ICD-10-CM | POA: Diagnosis not present

## 2016-09-30 DIAGNOSIS — N186 End stage renal disease: Secondary | ICD-10-CM | POA: Diagnosis not present

## 2016-10-02 DIAGNOSIS — N186 End stage renal disease: Secondary | ICD-10-CM | POA: Diagnosis not present

## 2016-10-02 DIAGNOSIS — N2581 Secondary hyperparathyroidism of renal origin: Secondary | ICD-10-CM | POA: Diagnosis not present

## 2016-10-02 DIAGNOSIS — D631 Anemia in chronic kidney disease: Secondary | ICD-10-CM | POA: Diagnosis not present

## 2016-10-02 DIAGNOSIS — D509 Iron deficiency anemia, unspecified: Secondary | ICD-10-CM | POA: Diagnosis not present

## 2016-10-02 DIAGNOSIS — N184 Chronic kidney disease, stage 4 (severe): Secondary | ICD-10-CM | POA: Diagnosis not present

## 2016-10-02 DIAGNOSIS — Z23 Encounter for immunization: Secondary | ICD-10-CM | POA: Diagnosis not present

## 2016-10-03 DIAGNOSIS — Z992 Dependence on renal dialysis: Secondary | ICD-10-CM | POA: Diagnosis not present

## 2016-10-03 DIAGNOSIS — N2889 Other specified disorders of kidney and ureter: Secondary | ICD-10-CM | POA: Diagnosis not present

## 2016-10-03 DIAGNOSIS — N186 End stage renal disease: Secondary | ICD-10-CM | POA: Diagnosis not present

## 2016-10-05 DIAGNOSIS — N184 Chronic kidney disease, stage 4 (severe): Secondary | ICD-10-CM | POA: Diagnosis not present

## 2016-10-05 DIAGNOSIS — N2581 Secondary hyperparathyroidism of renal origin: Secondary | ICD-10-CM | POA: Diagnosis not present

## 2016-10-05 DIAGNOSIS — N186 End stage renal disease: Secondary | ICD-10-CM | POA: Diagnosis not present

## 2016-10-05 DIAGNOSIS — D509 Iron deficiency anemia, unspecified: Secondary | ICD-10-CM | POA: Diagnosis not present

## 2016-10-05 DIAGNOSIS — D631 Anemia in chronic kidney disease: Secondary | ICD-10-CM | POA: Diagnosis not present

## 2016-10-07 DIAGNOSIS — D509 Iron deficiency anemia, unspecified: Secondary | ICD-10-CM | POA: Diagnosis not present

## 2016-10-07 DIAGNOSIS — D631 Anemia in chronic kidney disease: Secondary | ICD-10-CM | POA: Diagnosis not present

## 2016-10-07 DIAGNOSIS — N184 Chronic kidney disease, stage 4 (severe): Secondary | ICD-10-CM | POA: Diagnosis not present

## 2016-10-07 DIAGNOSIS — N2581 Secondary hyperparathyroidism of renal origin: Secondary | ICD-10-CM | POA: Diagnosis not present

## 2016-10-07 DIAGNOSIS — N186 End stage renal disease: Secondary | ICD-10-CM | POA: Diagnosis not present

## 2016-10-09 DIAGNOSIS — N186 End stage renal disease: Secondary | ICD-10-CM | POA: Diagnosis not present

## 2016-10-09 DIAGNOSIS — D631 Anemia in chronic kidney disease: Secondary | ICD-10-CM | POA: Diagnosis not present

## 2016-10-09 DIAGNOSIS — N184 Chronic kidney disease, stage 4 (severe): Secondary | ICD-10-CM | POA: Diagnosis not present

## 2016-10-09 DIAGNOSIS — N2581 Secondary hyperparathyroidism of renal origin: Secondary | ICD-10-CM | POA: Diagnosis not present

## 2016-10-09 DIAGNOSIS — D509 Iron deficiency anemia, unspecified: Secondary | ICD-10-CM | POA: Diagnosis not present

## 2016-10-12 DIAGNOSIS — N184 Chronic kidney disease, stage 4 (severe): Secondary | ICD-10-CM | POA: Diagnosis not present

## 2016-10-12 DIAGNOSIS — D631 Anemia in chronic kidney disease: Secondary | ICD-10-CM | POA: Diagnosis not present

## 2016-10-12 DIAGNOSIS — N186 End stage renal disease: Secondary | ICD-10-CM | POA: Diagnosis not present

## 2016-10-12 DIAGNOSIS — N2581 Secondary hyperparathyroidism of renal origin: Secondary | ICD-10-CM | POA: Diagnosis not present

## 2016-10-12 DIAGNOSIS — D509 Iron deficiency anemia, unspecified: Secondary | ICD-10-CM | POA: Diagnosis not present

## 2016-10-14 DIAGNOSIS — N186 End stage renal disease: Secondary | ICD-10-CM | POA: Diagnosis not present

## 2016-10-14 DIAGNOSIS — D631 Anemia in chronic kidney disease: Secondary | ICD-10-CM | POA: Diagnosis not present

## 2016-10-14 DIAGNOSIS — N184 Chronic kidney disease, stage 4 (severe): Secondary | ICD-10-CM | POA: Diagnosis not present

## 2016-10-14 DIAGNOSIS — N2581 Secondary hyperparathyroidism of renal origin: Secondary | ICD-10-CM | POA: Diagnosis not present

## 2016-10-14 DIAGNOSIS — D509 Iron deficiency anemia, unspecified: Secondary | ICD-10-CM | POA: Diagnosis not present

## 2016-10-16 DIAGNOSIS — D509 Iron deficiency anemia, unspecified: Secondary | ICD-10-CM | POA: Diagnosis not present

## 2016-10-16 DIAGNOSIS — D631 Anemia in chronic kidney disease: Secondary | ICD-10-CM | POA: Diagnosis not present

## 2016-10-16 DIAGNOSIS — N2581 Secondary hyperparathyroidism of renal origin: Secondary | ICD-10-CM | POA: Diagnosis not present

## 2016-10-16 DIAGNOSIS — N184 Chronic kidney disease, stage 4 (severe): Secondary | ICD-10-CM | POA: Diagnosis not present

## 2016-10-16 DIAGNOSIS — N186 End stage renal disease: Secondary | ICD-10-CM | POA: Diagnosis not present

## 2016-10-19 DIAGNOSIS — N186 End stage renal disease: Secondary | ICD-10-CM | POA: Diagnosis not present

## 2016-10-19 DIAGNOSIS — D631 Anemia in chronic kidney disease: Secondary | ICD-10-CM | POA: Diagnosis not present

## 2016-10-19 DIAGNOSIS — N2581 Secondary hyperparathyroidism of renal origin: Secondary | ICD-10-CM | POA: Diagnosis not present

## 2016-10-19 DIAGNOSIS — N184 Chronic kidney disease, stage 4 (severe): Secondary | ICD-10-CM | POA: Diagnosis not present

## 2016-10-19 DIAGNOSIS — D509 Iron deficiency anemia, unspecified: Secondary | ICD-10-CM | POA: Diagnosis not present

## 2016-10-21 DIAGNOSIS — N186 End stage renal disease: Secondary | ICD-10-CM | POA: Diagnosis not present

## 2016-10-21 DIAGNOSIS — N184 Chronic kidney disease, stage 4 (severe): Secondary | ICD-10-CM | POA: Diagnosis not present

## 2016-10-21 DIAGNOSIS — N2581 Secondary hyperparathyroidism of renal origin: Secondary | ICD-10-CM | POA: Diagnosis not present

## 2016-10-21 DIAGNOSIS — D509 Iron deficiency anemia, unspecified: Secondary | ICD-10-CM | POA: Diagnosis not present

## 2016-10-21 DIAGNOSIS — D631 Anemia in chronic kidney disease: Secondary | ICD-10-CM | POA: Diagnosis not present

## 2016-10-23 DIAGNOSIS — D509 Iron deficiency anemia, unspecified: Secondary | ICD-10-CM | POA: Diagnosis not present

## 2016-10-23 DIAGNOSIS — D631 Anemia in chronic kidney disease: Secondary | ICD-10-CM | POA: Diagnosis not present

## 2016-10-23 DIAGNOSIS — N2581 Secondary hyperparathyroidism of renal origin: Secondary | ICD-10-CM | POA: Diagnosis not present

## 2016-10-23 DIAGNOSIS — N184 Chronic kidney disease, stage 4 (severe): Secondary | ICD-10-CM | POA: Diagnosis not present

## 2016-10-23 DIAGNOSIS — N186 End stage renal disease: Secondary | ICD-10-CM | POA: Diagnosis not present

## 2016-10-26 DIAGNOSIS — N186 End stage renal disease: Secondary | ICD-10-CM | POA: Diagnosis not present

## 2016-10-26 DIAGNOSIS — D631 Anemia in chronic kidney disease: Secondary | ICD-10-CM | POA: Diagnosis not present

## 2016-10-26 DIAGNOSIS — N2581 Secondary hyperparathyroidism of renal origin: Secondary | ICD-10-CM | POA: Diagnosis not present

## 2016-10-26 DIAGNOSIS — D509 Iron deficiency anemia, unspecified: Secondary | ICD-10-CM | POA: Diagnosis not present

## 2016-10-26 DIAGNOSIS — N184 Chronic kidney disease, stage 4 (severe): Secondary | ICD-10-CM | POA: Diagnosis not present

## 2016-10-28 DIAGNOSIS — N186 End stage renal disease: Secondary | ICD-10-CM | POA: Diagnosis not present

## 2016-10-28 DIAGNOSIS — N2581 Secondary hyperparathyroidism of renal origin: Secondary | ICD-10-CM | POA: Diagnosis not present

## 2016-10-28 DIAGNOSIS — D509 Iron deficiency anemia, unspecified: Secondary | ICD-10-CM | POA: Diagnosis not present

## 2016-10-28 DIAGNOSIS — D631 Anemia in chronic kidney disease: Secondary | ICD-10-CM | POA: Diagnosis not present

## 2016-10-28 DIAGNOSIS — N184 Chronic kidney disease, stage 4 (severe): Secondary | ICD-10-CM | POA: Diagnosis not present

## 2016-10-30 DIAGNOSIS — D631 Anemia in chronic kidney disease: Secondary | ICD-10-CM | POA: Diagnosis not present

## 2016-10-30 DIAGNOSIS — N184 Chronic kidney disease, stage 4 (severe): Secondary | ICD-10-CM | POA: Diagnosis not present

## 2016-10-30 DIAGNOSIS — N186 End stage renal disease: Secondary | ICD-10-CM | POA: Diagnosis not present

## 2016-10-30 DIAGNOSIS — N2581 Secondary hyperparathyroidism of renal origin: Secondary | ICD-10-CM | POA: Diagnosis not present

## 2016-10-30 DIAGNOSIS — D509 Iron deficiency anemia, unspecified: Secondary | ICD-10-CM | POA: Diagnosis not present

## 2016-11-02 DIAGNOSIS — D631 Anemia in chronic kidney disease: Secondary | ICD-10-CM | POA: Diagnosis not present

## 2016-11-02 DIAGNOSIS — D509 Iron deficiency anemia, unspecified: Secondary | ICD-10-CM | POA: Diagnosis not present

## 2016-11-02 DIAGNOSIS — N186 End stage renal disease: Secondary | ICD-10-CM | POA: Diagnosis not present

## 2016-11-02 DIAGNOSIS — N184 Chronic kidney disease, stage 4 (severe): Secondary | ICD-10-CM | POA: Diagnosis not present

## 2016-11-02 DIAGNOSIS — N2581 Secondary hyperparathyroidism of renal origin: Secondary | ICD-10-CM | POA: Diagnosis not present

## 2016-11-03 DIAGNOSIS — Z992 Dependence on renal dialysis: Secondary | ICD-10-CM | POA: Diagnosis not present

## 2016-11-03 DIAGNOSIS — N2889 Other specified disorders of kidney and ureter: Secondary | ICD-10-CM | POA: Diagnosis not present

## 2016-11-03 DIAGNOSIS — N186 End stage renal disease: Secondary | ICD-10-CM | POA: Diagnosis not present

## 2016-11-04 DIAGNOSIS — N2581 Secondary hyperparathyroidism of renal origin: Secondary | ICD-10-CM | POA: Diagnosis not present

## 2016-11-04 DIAGNOSIS — N184 Chronic kidney disease, stage 4 (severe): Secondary | ICD-10-CM | POA: Diagnosis not present

## 2016-11-04 DIAGNOSIS — D509 Iron deficiency anemia, unspecified: Secondary | ICD-10-CM | POA: Diagnosis not present

## 2016-11-04 DIAGNOSIS — N186 End stage renal disease: Secondary | ICD-10-CM | POA: Diagnosis not present

## 2016-11-04 DIAGNOSIS — D631 Anemia in chronic kidney disease: Secondary | ICD-10-CM | POA: Diagnosis not present

## 2016-11-04 LAB — BASIC METABOLIC PANEL: Potassium: 5.2 (ref 3.4–5.3)

## 2016-11-04 LAB — CBC AND DIFFERENTIAL: HEMOGLOBIN: 9.6 — AB (ref 13.5–17.5)

## 2016-11-06 DIAGNOSIS — N186 End stage renal disease: Secondary | ICD-10-CM | POA: Diagnosis not present

## 2016-11-06 DIAGNOSIS — D631 Anemia in chronic kidney disease: Secondary | ICD-10-CM | POA: Diagnosis not present

## 2016-11-06 DIAGNOSIS — D509 Iron deficiency anemia, unspecified: Secondary | ICD-10-CM | POA: Diagnosis not present

## 2016-11-06 DIAGNOSIS — N2581 Secondary hyperparathyroidism of renal origin: Secondary | ICD-10-CM | POA: Diagnosis not present

## 2016-11-06 DIAGNOSIS — N184 Chronic kidney disease, stage 4 (severe): Secondary | ICD-10-CM | POA: Diagnosis not present

## 2016-11-09 DIAGNOSIS — N186 End stage renal disease: Secondary | ICD-10-CM | POA: Diagnosis not present

## 2016-11-09 DIAGNOSIS — N2581 Secondary hyperparathyroidism of renal origin: Secondary | ICD-10-CM | POA: Diagnosis not present

## 2016-11-09 DIAGNOSIS — D631 Anemia in chronic kidney disease: Secondary | ICD-10-CM | POA: Diagnosis not present

## 2016-11-09 DIAGNOSIS — N184 Chronic kidney disease, stage 4 (severe): Secondary | ICD-10-CM | POA: Diagnosis not present

## 2016-11-09 DIAGNOSIS — D509 Iron deficiency anemia, unspecified: Secondary | ICD-10-CM | POA: Diagnosis not present

## 2016-11-10 ENCOUNTER — Encounter: Payer: Self-pay | Admitting: Internal Medicine

## 2016-11-10 ENCOUNTER — Non-Acute Institutional Stay: Payer: Medicare Other | Admitting: Internal Medicine

## 2016-11-10 VITALS — BP 126/62 | HR 63 | Temp 97.8°F | Wt 176.0 lb

## 2016-11-10 DIAGNOSIS — I25110 Atherosclerotic heart disease of native coronary artery with unstable angina pectoris: Secondary | ICD-10-CM | POA: Diagnosis not present

## 2016-11-10 DIAGNOSIS — G8929 Other chronic pain: Secondary | ICD-10-CM | POA: Diagnosis not present

## 2016-11-10 DIAGNOSIS — N186 End stage renal disease: Secondary | ICD-10-CM

## 2016-11-10 DIAGNOSIS — I25810 Atherosclerosis of coronary artery bypass graft(s) without angina pectoris: Secondary | ICD-10-CM

## 2016-11-10 DIAGNOSIS — M1A3710 Chronic gout due to renal impairment, right ankle and foot, without tophus (tophi): Secondary | ICD-10-CM

## 2016-11-10 DIAGNOSIS — I200 Unstable angina: Secondary | ICD-10-CM | POA: Diagnosis not present

## 2016-11-10 DIAGNOSIS — I158 Other secondary hypertension: Secondary | ICD-10-CM

## 2016-11-10 DIAGNOSIS — M545 Low back pain, unspecified: Secondary | ICD-10-CM

## 2016-11-10 DIAGNOSIS — K2100 Gastro-esophageal reflux disease with esophagitis, without bleeding: Secondary | ICD-10-CM

## 2016-11-10 DIAGNOSIS — Z952 Presence of prosthetic heart valve: Secondary | ICD-10-CM

## 2016-11-10 DIAGNOSIS — I770 Arteriovenous fistula, acquired: Secondary | ICD-10-CM

## 2016-11-10 DIAGNOSIS — Z992 Dependence on renal dialysis: Secondary | ICD-10-CM | POA: Diagnosis not present

## 2016-11-10 NOTE — Patient Instructions (Addendum)
Try taking tylenol for your back pain.  You can take up to 3000mg  per day.    Please bring me a copy of your labs from dialysis when you can.   I would like to check your uric acid level and cholesterol before I see you in 6 mos.

## 2016-11-10 NOTE — Progress Notes (Signed)
Location:  Occupational psychologist of Service:  Clinic (12)  Provider: Tiffiny Worthy L. Mariea Clonts, D.O., C.M.D.  Code Status: DNR Goals of Care:  Advanced Directives 05/12/2016  Does Patient Have a Medical Advance Directive? Yes  Type of Advance Directive [place]  Does patient want to make changes to medical advance directive? -  Copy of [place] in Chart? Yes  Would patient like information on creating a medical advance directive? -   Chief Complaint  Patient presents with  . Medical Management of Chronic Issues    6 mth follow-up    HPI: Patient is a 83 y.o. male seen today for medical management of chronic diseases.    Got his flu shot at dialysis "a few weeks ago" by his report.    Back is hurting him in his lower back when walking.  Has cut walking down and weight is up.  Can only take 10 paces and his back starts to hurt.  Stays localized to the lower back.  No radiculopathy.  Has not done anything for it medication wise.  Pain started suddenly after he got back to his previous walking regimen.  Discussed tylenoll and heat.  No red flags.  No back pain when sitting.    Had a fall at [place] appt when he stood after his legs were crossed for a while.  Says his circulation has gone downhill since 22.    Right hand swollen today.  It's the side being used for his fistula--has had on right side for a year.   No changes at HD lately.    CAD:  Followed up with Dr. Caryl Comes 06/11/16.  He is s/p AVR.  Bioprosthesis.  LVEF 35% with cath.  Echo 1/17 EF was 40-45%.  No chest pain or sob.  Gets itching and pulling at scar.    He is often very tired, but did not c/o fatigue as much today.    Not having problems with his medications.  Does frequently miss them, he reports.  Has excessive phosphorus in his bloodstream.  Does not like the pill.  Trying to use tums to help instead.  Does not take enough of them.  Misses the pills when he  comes back from HD and forgets the pills with lunch.   Uses an alarm on his phone for night pills, but not for those afternoon ones.  Has some discomfort at times in the left foot related to gout.  Says pain not severe enough for a change.    BP at goal.    Has had occasional acid indigestion.  On omeprazole.  Says it's probably a day he skipped the omeprazole.  Had this a week or two ago.    Using loratadine for rhinorrhea.    Past Medical History:  Diagnosis Date  . Anxiety   . Arthritis   . CKD (chronic kidney disease) stage 5, GFR less than 15 ml/min (HCC)   . Complication of anesthesia    " one time my heart stopped due to a medication that I was on."   . GERD (gastroesophageal reflux disease)   . Gout   . Heart murmur    as a teen  . Hemodialysis patient [facility])    Tues, Thursday, Saturday  . Herpes zoster 04/2012  . Hypertension   . Shortness of breath dyspnea    with exertion  . Unspecified essential hypertension     Past Surgical History:  Procedure Laterality Date  . APPENDECTOMY  1944  . AV FISTULA PLACEMENT Left    Left Cimino AVF placed in [place]   . CATARACT EXTRACTION W/ INTRAOCULAR LENS IMPLANT Bilateral 2014   [facility]  . COLONOSCOPY  2013   Dr. Annamaria Helling [place], [place].  . DIALYSIS FISTULA CREATION  08/04/2012 and 10/13   Dr. Harden [place]  . EYE SURGERY     Cataract  . INSERTION OF DIALYSIS CATHETER Right 01-15-14   Right chest TDC placed by Dr. Augustin Coupe at [facility]    No Known Allergies  Outpatient Encounter Medications as of 11/10/2016  Medication Sig  . allopurinol (ZYLOPRIM) 100 MG tablet TAKE 1 TABLET BY MOUTH DAILY  . amLODipine (NORVASC) 10 MG tablet TAKE 1 TABLET BY MOUTH DAILY TO CONTROL BLOOD PRESSURE  . atorvastatin (LIPITOR) 20 MG tablet TAKE 1 TABLET BY MOUTH DAILY FOR CHOLESTEROL  . calcium carbonate (TUMS - DOSED IN MG ELEMENTAL CALCIUM) 500 MG chewable tablet Chew 2 tablets by mouth 3 (three) times daily with meals.  .  cinacalcet (SENSIPAR) 30 MG tablet Take 30 mg by mouth daily.  Marland Kitchen loratadine (CLARITIN) 10 MG tablet Take 10 mg daily by mouth.  . metoprolol tartrate (LOPRESSOR) 25 MG tablet TAKE (1/2) TABLET TWICE DAILY.  Marland Kitchen omeprazole (PRILOSEC) 20 MG capsule Take 20 mg by mouth 2 (two) times daily before a meal.    No facility-administered encounter medications on file as of 11/10/2016.     Review of Systems:  Review of Systems  Constitutional: Positive for malaise/fatigue. Negative for chills and fever.  HENT: Negative for congestion.   Eyes: Negative for blurred vision.  Respiratory: Negative for cough and shortness of breath.   Cardiovascular: Negative for chest pain, palpitations and leg swelling.       Swelling of right vs left hand today  Gastrointestinal: Negative for abdominal pain, blood in stool, constipation and melena.  Genitourinary: Negative for dysuria.  Musculoskeletal: Positive for back pain, falls and joint pain.       Toe pain related to gout  Skin: Negative for itching and rash.  Neurological: Negative for dizziness, loss of consciousness and weakness.  Psychiatric/Behavioral: Positive for memory loss.       Some short term memory loss since heart surgery    Health Maintenance  Topic Date Due  . TETANUS/TDAP  06/22/1949  . INFLUENZA VACCINE  08/04/2016  . PNA vac Low Risk Adult  Completed    Physical Exam: Vitals:   11/10/16 1047  BP: 126/62  Pulse: 63  Temp: 97.8 F (36.6 C)  TempSrc: Oral  SpO2: 96%  Weight: 176 lb (79.8 kg)   Body mass index is 25.99 kg/m. Physical Exam  Constitutional: He is oriented to person, place, and time. He appears well-developed and well-nourished. No distress.  HENT:  Head: Normocephalic and atraumatic.  Cardiovascular: Normal rate, regular rhythm and intact distal pulses.  Murmur heard. Right UE AV fistula with good pulse and thrill  Pulmonary/Chest: Effort normal and breath sounds normal. No respiratory distress.  Abdominal:  Soft. Bowel sounds are normal.  Musculoskeletal: Normal range of motion.  Stooped posture, walks w/o assistive device, back pain with walking, but no tenderness  Neurological: He is alert and oriented to person, place, and time.  Skin: Skin is warm and dry. Capillary refill takes less than 2 seconds.  Psychiatric: He has a normal mood and affect. His behavior is normal.  Poor historian at times    Labs  reviewed: Basic Metabolic Panel: No results for input(s): NA, K, CL, CO2, GLUCOSE, BUN, CREATININE, CALCIUM, MG, PHOS, TSH in the last 8760 hours. Liver Function Tests: No results for input(s): AST, ALT, ALKPHOS, BILITOT, PROT, ALBUMIN in the last 8760 hours. No results for input(s): LIPASE, AMYLASE in the last 8760 hours. No results for input(s): AMMONIA in the last 8760 hours. CBC: No results for input(s): WBC, NEUTROABS, HGB, HCT, MCV, PLT in the last 8760 hours. Lipid Panel: No results for input(s): CHOL, HDL, LDLCALC, TRIG, CHOLHDL, LDLDIRECT in the last 8760 hours. Lab Results  Component Value Date   HGBA1C 5.2 12/06/2014   Assessment/Plan 1. Coronary artery disease involving native coronary artery of native heart with unstable angina pectoris ([place]) -no recent chest pain or dyspnea -followed by cardiology and last seen in June -continues on norvasc, lipitor, lopressor  2. Other secondary hypertension -renal cause presumably, also has heart disease -at goal with amlodipine, lopressor, cont to monitor  3. ESRD on dialysis [facility]) -continues three times weekly HD, Dr. Lorrene Reid is nephrologist, goes to [facility] -reports elevated phosphorus from not taking his sensipar as directed, is using TUMS instead but forgets them, too -discussed reminder on phone on HD days to take them with lunch to prevent forgetting  4. Gastroesophageal reflux disease with esophagitis -had a period of increased symptoms 1-2 weeks ago, but better now, continue omeprazole--suspect he did miss it  causing that flare up  5. Chronic gout of right foot due to renal impairment without tophus -cont allopurinol for now, but may need uloric if uric acid level elevated--is having some toe pain at times which he had not had -due to ESRD  6. S/P AVR -stable on echo at cardiology appt  7. Chronic midline low back pain without sciatica -new in past 6 mos and bothersome only with walking, no red flags or radiculopathy -advised to use tylenol up to 3g max per day and apply heat to lower back with heating pad  8. A-V fistula (HCC) -in right upper arm with good pulse and thrill -slight swelling of right hand today noted.   -used for HD Tues/Thurs/Sat  Labs/tests ordered:  Uric acid and fasting lipid panel with next labs at HD--faxed to [facility] HD center  Next appt:  6 mos med mgt, labs in b/w at HD center  [place]. Dyasia Firestine, D.O. [facility] [address], [place] [address] Cell Phone (Mon-Fri 8am-5pm):  [phone] On Call:  [phone] & follow prompts after 5pm & weekends Office Phone:  [phone] Office Fax:  [phone]

## 2016-11-11 DIAGNOSIS — N184 Chronic kidney disease, stage 4 (severe): Secondary | ICD-10-CM | POA: Diagnosis not present

## 2016-11-11 DIAGNOSIS — D509 Iron deficiency anemia, unspecified: Secondary | ICD-10-CM | POA: Diagnosis not present

## 2016-11-11 DIAGNOSIS — D631 Anemia in chronic kidney disease: Secondary | ICD-10-CM | POA: Diagnosis not present

## 2016-11-11 DIAGNOSIS — N2581 Secondary hyperparathyroidism of renal origin: Secondary | ICD-10-CM | POA: Diagnosis not present

## 2016-11-11 DIAGNOSIS — N186 End stage renal disease: Secondary | ICD-10-CM | POA: Diagnosis not present

## 2016-11-13 DIAGNOSIS — N2581 Secondary hyperparathyroidism of renal origin: Secondary | ICD-10-CM | POA: Diagnosis not present

## 2016-11-13 DIAGNOSIS — N184 Chronic kidney disease, stage 4 (severe): Secondary | ICD-10-CM | POA: Diagnosis not present

## 2016-11-13 DIAGNOSIS — D509 Iron deficiency anemia, unspecified: Secondary | ICD-10-CM | POA: Diagnosis not present

## 2016-11-13 DIAGNOSIS — N186 End stage renal disease: Secondary | ICD-10-CM | POA: Diagnosis not present

## 2016-11-13 DIAGNOSIS — D631 Anemia in chronic kidney disease: Secondary | ICD-10-CM | POA: Diagnosis not present

## 2016-11-15 ENCOUNTER — Other Ambulatory Visit: Payer: Self-pay | Admitting: Internal Medicine

## 2016-11-16 DIAGNOSIS — N186 End stage renal disease: Secondary | ICD-10-CM | POA: Diagnosis not present

## 2016-11-16 DIAGNOSIS — D631 Anemia in chronic kidney disease: Secondary | ICD-10-CM | POA: Diagnosis not present

## 2016-11-16 DIAGNOSIS — N2581 Secondary hyperparathyroidism of renal origin: Secondary | ICD-10-CM | POA: Diagnosis not present

## 2016-11-16 DIAGNOSIS — D509 Iron deficiency anemia, unspecified: Secondary | ICD-10-CM | POA: Diagnosis not present

## 2016-11-16 DIAGNOSIS — N184 Chronic kidney disease, stage 4 (severe): Secondary | ICD-10-CM | POA: Diagnosis not present

## 2016-11-18 DIAGNOSIS — N2581 Secondary hyperparathyroidism of renal origin: Secondary | ICD-10-CM | POA: Diagnosis not present

## 2016-11-18 DIAGNOSIS — N184 Chronic kidney disease, stage 4 (severe): Secondary | ICD-10-CM | POA: Diagnosis not present

## 2016-11-18 DIAGNOSIS — D631 Anemia in chronic kidney disease: Secondary | ICD-10-CM | POA: Diagnosis not present

## 2016-11-18 DIAGNOSIS — D509 Iron deficiency anemia, unspecified: Secondary | ICD-10-CM | POA: Diagnosis not present

## 2016-11-18 DIAGNOSIS — N186 End stage renal disease: Secondary | ICD-10-CM | POA: Diagnosis not present

## 2016-11-20 DIAGNOSIS — N186 End stage renal disease: Secondary | ICD-10-CM | POA: Diagnosis not present

## 2016-11-20 DIAGNOSIS — N184 Chronic kidney disease, stage 4 (severe): Secondary | ICD-10-CM | POA: Diagnosis not present

## 2016-11-20 DIAGNOSIS — N2581 Secondary hyperparathyroidism of renal origin: Secondary | ICD-10-CM | POA: Diagnosis not present

## 2016-11-20 DIAGNOSIS — D631 Anemia in chronic kidney disease: Secondary | ICD-10-CM | POA: Diagnosis not present

## 2016-11-20 DIAGNOSIS — D509 Iron deficiency anemia, unspecified: Secondary | ICD-10-CM | POA: Diagnosis not present

## 2016-11-22 DIAGNOSIS — N2581 Secondary hyperparathyroidism of renal origin: Secondary | ICD-10-CM | POA: Diagnosis not present

## 2016-11-22 DIAGNOSIS — D631 Anemia in chronic kidney disease: Secondary | ICD-10-CM | POA: Diagnosis not present

## 2016-11-22 DIAGNOSIS — N184 Chronic kidney disease, stage 4 (severe): Secondary | ICD-10-CM | POA: Diagnosis not present

## 2016-11-22 DIAGNOSIS — D509 Iron deficiency anemia, unspecified: Secondary | ICD-10-CM | POA: Diagnosis not present

## 2016-11-22 DIAGNOSIS — N186 End stage renal disease: Secondary | ICD-10-CM | POA: Diagnosis not present

## 2016-11-24 DIAGNOSIS — N184 Chronic kidney disease, stage 4 (severe): Secondary | ICD-10-CM | POA: Diagnosis not present

## 2016-11-24 DIAGNOSIS — D631 Anemia in chronic kidney disease: Secondary | ICD-10-CM | POA: Diagnosis not present

## 2016-11-24 DIAGNOSIS — N186 End stage renal disease: Secondary | ICD-10-CM | POA: Diagnosis not present

## 2016-11-24 DIAGNOSIS — N2581 Secondary hyperparathyroidism of renal origin: Secondary | ICD-10-CM | POA: Diagnosis not present

## 2016-11-24 DIAGNOSIS — D509 Iron deficiency anemia, unspecified: Secondary | ICD-10-CM | POA: Diagnosis not present

## 2016-11-27 DIAGNOSIS — N186 End stage renal disease: Secondary | ICD-10-CM | POA: Diagnosis not present

## 2016-11-27 DIAGNOSIS — D631 Anemia in chronic kidney disease: Secondary | ICD-10-CM | POA: Diagnosis not present

## 2016-11-27 DIAGNOSIS — N184 Chronic kidney disease, stage 4 (severe): Secondary | ICD-10-CM | POA: Diagnosis not present

## 2016-11-27 DIAGNOSIS — N2581 Secondary hyperparathyroidism of renal origin: Secondary | ICD-10-CM | POA: Diagnosis not present

## 2016-11-27 DIAGNOSIS — D509 Iron deficiency anemia, unspecified: Secondary | ICD-10-CM | POA: Diagnosis not present

## 2016-11-30 DIAGNOSIS — N2581 Secondary hyperparathyroidism of renal origin: Secondary | ICD-10-CM | POA: Diagnosis not present

## 2016-11-30 DIAGNOSIS — N184 Chronic kidney disease, stage 4 (severe): Secondary | ICD-10-CM | POA: Diagnosis not present

## 2016-11-30 DIAGNOSIS — D509 Iron deficiency anemia, unspecified: Secondary | ICD-10-CM | POA: Diagnosis not present

## 2016-11-30 DIAGNOSIS — D631 Anemia in chronic kidney disease: Secondary | ICD-10-CM | POA: Diagnosis not present

## 2016-11-30 DIAGNOSIS — N186 End stage renal disease: Secondary | ICD-10-CM | POA: Diagnosis not present

## 2016-12-02 DIAGNOSIS — N184 Chronic kidney disease, stage 4 (severe): Secondary | ICD-10-CM | POA: Diagnosis not present

## 2016-12-02 DIAGNOSIS — N2581 Secondary hyperparathyroidism of renal origin: Secondary | ICD-10-CM | POA: Diagnosis not present

## 2016-12-02 DIAGNOSIS — D509 Iron deficiency anemia, unspecified: Secondary | ICD-10-CM | POA: Diagnosis not present

## 2016-12-02 DIAGNOSIS — D631 Anemia in chronic kidney disease: Secondary | ICD-10-CM | POA: Diagnosis not present

## 2016-12-02 DIAGNOSIS — N186 End stage renal disease: Secondary | ICD-10-CM | POA: Diagnosis not present

## 2016-12-03 ENCOUNTER — Encounter: Payer: Self-pay | Admitting: Nurse Practitioner

## 2016-12-03 DIAGNOSIS — N186 End stage renal disease: Secondary | ICD-10-CM | POA: Diagnosis not present

## 2016-12-03 DIAGNOSIS — Z992 Dependence on renal dialysis: Secondary | ICD-10-CM | POA: Diagnosis not present

## 2016-12-03 DIAGNOSIS — N2889 Other specified disorders of kidney and ureter: Secondary | ICD-10-CM | POA: Diagnosis not present

## 2016-12-04 DIAGNOSIS — N186 End stage renal disease: Secondary | ICD-10-CM | POA: Diagnosis not present

## 2016-12-04 DIAGNOSIS — N184 Chronic kidney disease, stage 4 (severe): Secondary | ICD-10-CM | POA: Diagnosis not present

## 2016-12-04 DIAGNOSIS — N2581 Secondary hyperparathyroidism of renal origin: Secondary | ICD-10-CM | POA: Diagnosis not present

## 2016-12-04 DIAGNOSIS — D631 Anemia in chronic kidney disease: Secondary | ICD-10-CM | POA: Diagnosis not present

## 2016-12-04 DIAGNOSIS — D509 Iron deficiency anemia, unspecified: Secondary | ICD-10-CM | POA: Diagnosis not present

## 2016-12-04 LAB — BASIC METABOLIC PANEL: Potassium: 5.2 (ref 3.4–5.3)

## 2016-12-04 LAB — CBC AND DIFFERENTIAL: Hemoglobin: 9.4 — AB (ref 13.5–17.5)

## 2016-12-07 DIAGNOSIS — N184 Chronic kidney disease, stage 4 (severe): Secondary | ICD-10-CM | POA: Diagnosis not present

## 2016-12-07 DIAGNOSIS — N2581 Secondary hyperparathyroidism of renal origin: Secondary | ICD-10-CM | POA: Diagnosis not present

## 2016-12-07 DIAGNOSIS — N186 End stage renal disease: Secondary | ICD-10-CM | POA: Diagnosis not present

## 2016-12-07 DIAGNOSIS — D631 Anemia in chronic kidney disease: Secondary | ICD-10-CM | POA: Diagnosis not present

## 2016-12-07 DIAGNOSIS — D509 Iron deficiency anemia, unspecified: Secondary | ICD-10-CM | POA: Diagnosis not present

## 2016-12-09 DIAGNOSIS — N186 End stage renal disease: Secondary | ICD-10-CM | POA: Diagnosis not present

## 2016-12-09 DIAGNOSIS — N2581 Secondary hyperparathyroidism of renal origin: Secondary | ICD-10-CM | POA: Diagnosis not present

## 2016-12-09 DIAGNOSIS — D509 Iron deficiency anemia, unspecified: Secondary | ICD-10-CM | POA: Diagnosis not present

## 2016-12-09 DIAGNOSIS — N184 Chronic kidney disease, stage 4 (severe): Secondary | ICD-10-CM | POA: Diagnosis not present

## 2016-12-09 DIAGNOSIS — D631 Anemia in chronic kidney disease: Secondary | ICD-10-CM | POA: Diagnosis not present

## 2016-12-11 DIAGNOSIS — N2581 Secondary hyperparathyroidism of renal origin: Secondary | ICD-10-CM | POA: Diagnosis not present

## 2016-12-11 DIAGNOSIS — N186 End stage renal disease: Secondary | ICD-10-CM | POA: Diagnosis not present

## 2016-12-11 DIAGNOSIS — D509 Iron deficiency anemia, unspecified: Secondary | ICD-10-CM | POA: Diagnosis not present

## 2016-12-11 DIAGNOSIS — D631 Anemia in chronic kidney disease: Secondary | ICD-10-CM | POA: Diagnosis not present

## 2016-12-11 DIAGNOSIS — N184 Chronic kidney disease, stage 4 (severe): Secondary | ICD-10-CM | POA: Diagnosis not present

## 2016-12-13 ENCOUNTER — Ambulatory Visit: Payer: Self-pay | Admitting: Nurse Practitioner

## 2016-12-14 DIAGNOSIS — N2581 Secondary hyperparathyroidism of renal origin: Secondary | ICD-10-CM | POA: Diagnosis not present

## 2016-12-14 DIAGNOSIS — D509 Iron deficiency anemia, unspecified: Secondary | ICD-10-CM | POA: Diagnosis not present

## 2016-12-14 DIAGNOSIS — N186 End stage renal disease: Secondary | ICD-10-CM | POA: Diagnosis not present

## 2016-12-14 DIAGNOSIS — D631 Anemia in chronic kidney disease: Secondary | ICD-10-CM | POA: Diagnosis not present

## 2016-12-14 DIAGNOSIS — N184 Chronic kidney disease, stage 4 (severe): Secondary | ICD-10-CM | POA: Diagnosis not present

## 2016-12-16 DIAGNOSIS — D631 Anemia in chronic kidney disease: Secondary | ICD-10-CM | POA: Diagnosis not present

## 2016-12-16 DIAGNOSIS — N186 End stage renal disease: Secondary | ICD-10-CM | POA: Diagnosis not present

## 2016-12-16 DIAGNOSIS — N2581 Secondary hyperparathyroidism of renal origin: Secondary | ICD-10-CM | POA: Diagnosis not present

## 2016-12-16 DIAGNOSIS — N184 Chronic kidney disease, stage 4 (severe): Secondary | ICD-10-CM | POA: Diagnosis not present

## 2016-12-16 DIAGNOSIS — D509 Iron deficiency anemia, unspecified: Secondary | ICD-10-CM | POA: Diagnosis not present

## 2016-12-18 DIAGNOSIS — D509 Iron deficiency anemia, unspecified: Secondary | ICD-10-CM | POA: Diagnosis not present

## 2016-12-18 DIAGNOSIS — D631 Anemia in chronic kidney disease: Secondary | ICD-10-CM | POA: Diagnosis not present

## 2016-12-18 DIAGNOSIS — N2581 Secondary hyperparathyroidism of renal origin: Secondary | ICD-10-CM | POA: Diagnosis not present

## 2016-12-18 DIAGNOSIS — N186 End stage renal disease: Secondary | ICD-10-CM | POA: Diagnosis not present

## 2016-12-18 DIAGNOSIS — N184 Chronic kidney disease, stage 4 (severe): Secondary | ICD-10-CM | POA: Diagnosis not present

## 2016-12-21 DIAGNOSIS — N184 Chronic kidney disease, stage 4 (severe): Secondary | ICD-10-CM | POA: Diagnosis not present

## 2016-12-21 DIAGNOSIS — N186 End stage renal disease: Secondary | ICD-10-CM | POA: Diagnosis not present

## 2016-12-21 DIAGNOSIS — D509 Iron deficiency anemia, unspecified: Secondary | ICD-10-CM | POA: Diagnosis not present

## 2016-12-21 DIAGNOSIS — D631 Anemia in chronic kidney disease: Secondary | ICD-10-CM | POA: Diagnosis not present

## 2016-12-21 DIAGNOSIS — N2581 Secondary hyperparathyroidism of renal origin: Secondary | ICD-10-CM | POA: Diagnosis not present

## 2016-12-23 DIAGNOSIS — D631 Anemia in chronic kidney disease: Secondary | ICD-10-CM | POA: Diagnosis not present

## 2016-12-23 DIAGNOSIS — D509 Iron deficiency anemia, unspecified: Secondary | ICD-10-CM | POA: Diagnosis not present

## 2016-12-23 DIAGNOSIS — N184 Chronic kidney disease, stage 4 (severe): Secondary | ICD-10-CM | POA: Diagnosis not present

## 2016-12-23 DIAGNOSIS — N186 End stage renal disease: Secondary | ICD-10-CM | POA: Diagnosis not present

## 2016-12-23 DIAGNOSIS — N2581 Secondary hyperparathyroidism of renal origin: Secondary | ICD-10-CM | POA: Diagnosis not present

## 2016-12-25 DIAGNOSIS — D509 Iron deficiency anemia, unspecified: Secondary | ICD-10-CM | POA: Diagnosis not present

## 2016-12-25 DIAGNOSIS — N2581 Secondary hyperparathyroidism of renal origin: Secondary | ICD-10-CM | POA: Diagnosis not present

## 2016-12-25 DIAGNOSIS — N186 End stage renal disease: Secondary | ICD-10-CM | POA: Diagnosis not present

## 2016-12-25 DIAGNOSIS — D631 Anemia in chronic kidney disease: Secondary | ICD-10-CM | POA: Diagnosis not present

## 2016-12-25 DIAGNOSIS — N184 Chronic kidney disease, stage 4 (severe): Secondary | ICD-10-CM | POA: Diagnosis not present

## 2016-12-27 DIAGNOSIS — D631 Anemia in chronic kidney disease: Secondary | ICD-10-CM | POA: Diagnosis not present

## 2016-12-27 DIAGNOSIS — N186 End stage renal disease: Secondary | ICD-10-CM | POA: Diagnosis not present

## 2016-12-27 DIAGNOSIS — N2581 Secondary hyperparathyroidism of renal origin: Secondary | ICD-10-CM | POA: Diagnosis not present

## 2016-12-27 DIAGNOSIS — N184 Chronic kidney disease, stage 4 (severe): Secondary | ICD-10-CM | POA: Diagnosis not present

## 2016-12-27 DIAGNOSIS — D509 Iron deficiency anemia, unspecified: Secondary | ICD-10-CM | POA: Diagnosis not present

## 2016-12-30 DIAGNOSIS — N186 End stage renal disease: Secondary | ICD-10-CM | POA: Diagnosis not present

## 2016-12-30 DIAGNOSIS — D509 Iron deficiency anemia, unspecified: Secondary | ICD-10-CM | POA: Diagnosis not present

## 2016-12-30 DIAGNOSIS — D631 Anemia in chronic kidney disease: Secondary | ICD-10-CM | POA: Diagnosis not present

## 2016-12-30 DIAGNOSIS — N2581 Secondary hyperparathyroidism of renal origin: Secondary | ICD-10-CM | POA: Diagnosis not present

## 2016-12-30 DIAGNOSIS — N184 Chronic kidney disease, stage 4 (severe): Secondary | ICD-10-CM | POA: Diagnosis not present

## 2017-01-01 DIAGNOSIS — N186 End stage renal disease: Secondary | ICD-10-CM | POA: Diagnosis not present

## 2017-01-01 DIAGNOSIS — D509 Iron deficiency anemia, unspecified: Secondary | ICD-10-CM | POA: Diagnosis not present

## 2017-01-01 DIAGNOSIS — N184 Chronic kidney disease, stage 4 (severe): Secondary | ICD-10-CM | POA: Diagnosis not present

## 2017-01-01 DIAGNOSIS — D631 Anemia in chronic kidney disease: Secondary | ICD-10-CM | POA: Diagnosis not present

## 2017-01-01 DIAGNOSIS — N2581 Secondary hyperparathyroidism of renal origin: Secondary | ICD-10-CM | POA: Diagnosis not present

## 2017-01-03 DIAGNOSIS — N186 End stage renal disease: Secondary | ICD-10-CM | POA: Diagnosis not present

## 2017-01-03 DIAGNOSIS — D631 Anemia in chronic kidney disease: Secondary | ICD-10-CM | POA: Diagnosis not present

## 2017-01-03 DIAGNOSIS — N184 Chronic kidney disease, stage 4 (severe): Secondary | ICD-10-CM | POA: Diagnosis not present

## 2017-01-03 DIAGNOSIS — N2581 Secondary hyperparathyroidism of renal origin: Secondary | ICD-10-CM | POA: Diagnosis not present

## 2017-01-03 DIAGNOSIS — Z992 Dependence on renal dialysis: Secondary | ICD-10-CM | POA: Diagnosis not present

## 2017-01-03 DIAGNOSIS — N2889 Other specified disorders of kidney and ureter: Secondary | ICD-10-CM | POA: Diagnosis not present

## 2017-01-03 DIAGNOSIS — D509 Iron deficiency anemia, unspecified: Secondary | ICD-10-CM | POA: Diagnosis not present

## 2017-01-04 LAB — BASIC METABOLIC PANEL: Potassium: 4.9 (ref 3.4–5.3)

## 2017-01-04 LAB — CBC AND DIFFERENTIAL: HEMOGLOBIN: 9.7 — AB (ref 13.5–17.5)

## 2017-01-05 ENCOUNTER — Ambulatory Visit: Payer: Medicare Other | Admitting: Nurse Practitioner

## 2017-01-05 NOTE — Progress Notes (Deleted)
CARDIOLOGY OFFICE NOTE  Date:  01/05/2017    Eric [name] Date of Birth: [birth year] Medical Record #[id number]  PCP:  Gayland Curry, DO  Cardiologist:  Ree Shay   No chief complaint on file.   History of Present Illness: Eric [name] is a 84 y.o. male who presents today for a follow up visit. Seen for Dr. Caryl Comes.   He has a history of ESRD on HD via AV fistula, insomnia, HTN, and GERD. Reported stress test at [facility] in [place], [place] 2 summers ago - noted in the record that he "has a problem with the one chamber of his heart not being attached properly and he knows about it since childhood". No actual documentation of this. Reports history of cardiac arrest approximately 2 years ago while having fistula procedure - sounds like he had a stress test at that time.   I saw him in the FLEX back in November of 2016 as a new patient for [facility] - had abnormal EKG and murmur on exam. Ended up getting echo, CT (negative for PE) and cath.   Cardiac cath on 11/27/2014 showed severe 3-vessel coronary disease. The proximal and mid LAD were heavily calcified with 80% proximal and 99% mid LAD stenosis followed by a 90% mid stenosis. The LCX had a 60% stenosis in a large OM2 and an occluded OM3 that long but small in diameter or underfilled by the collat from the LAD. The RCA is occluded distally with faint filling of a small diameter PDA branch by collat from the LAD. His echo showed at least moderate AS with a mean gradient of 36 mm Hg and a DI of 0.21. LVEF was reduced to 35% with anterior, septal and inferior wall motion abnormalities.   He underwent CABG x 3 with LIMA to LAD, SVG to OM2 and SVG to PDA with AVR with #23 Edwards Magna-Ease pericardial valve by Dr. Cyndia Bent on 12/09/2014. Nephrology assisted with his dialysis. He did initially require some inotropic support but this was able to be weaned over time without difficulty. He had no significant postoperative cardiac  dysrhythmias. He is on chronic Aranesp managed by the nephrologist.   I last saw him in May of 2017. Saw Dr. Caryl Comes in June of 2018. More fatigue noted - echo updated - this looked ok.   Comes back today. Here with his wife.    Past Medical History:  Diagnosis Date  . Anxiety   . Arthritis   . CKD (chronic kidney disease) stage 5, GFR less than 15 ml/min (HCC)   . Complication of anesthesia    " one time my heart stopped due to a medication that I was on."   . GERD (gastroesophageal reflux disease)   . Gout   . Heart murmur    as a teen  . Hemodialysis patient [facility])    Tues, Thursday, Saturday  . Herpes zoster 04/2012  . Hypertension   . Shortness of breath dyspnea    with exertion  . Unspecified essential hypertension     Past Surgical History:  Procedure Laterality Date  . AORTIC VALVE REPLACEMENT N/A 12/09/2014   Procedure: AORTIC VALVE REPLACEMENT (AVR) WITH 23MM MAGNA EASE BIOPROSTHETIC VALVE;  Surgeon: Gaye Pollack, MD;  Location: [place] OR;  Service: Open Heart Surgery;  Laterality: N/A;  . APPENDECTOMY  1944  . AV FISTULA PLACEMENT Left    Left Cimino AVF placed in [place]   . AV FISTULA PLACEMENT Right  03/06/2014   Procedure: ARTERIOVENOUS (AV) FISTULA CREATION-right radiocephalic;  Surgeon: Mal Misty, MD;  Location: [facility] OR;  Service: Vascular;  Laterality: Right;  . AV FISTULA PLACEMENT Right 07/15/2014   Procedure: ARTERIOVENOUS (AV) FISTULA CREATION;  Surgeon: Elam Dutch, MD;  Location: [place];  Service: Vascular;  Laterality: Right;  . CARDIAC CATHETERIZATION N/A 11/27/2014   Procedure: Right/Left Heart Cath and Coronary Angiography;  Surgeon: Burnell Blanks, MD;  Location: [place] [facility] LAB;  Service: Cardiovascular;  Laterality: N/A;  . CATARACT EXTRACTION W/ INTRAOCULAR LENS IMPLANT Bilateral 2014   [facility]  . COLONOSCOPY  2013   Dr. Annamaria Helling [place], [place].  . CORONARY ARTERY BYPASS GRAFT N/A 12/09/2014   Procedure: CORONARY ARTERY  BYPASS GRAFTING (CABG), ON PUMP, TIMES THREE, USING LEFT INTERNAL MAMMARY ARTERY, RIGHT GREATER SAPHENOUS VEIN HARVESTED ENDOSCOPICALLY;  Surgeon: Gaye Pollack, MD;  Location: [place];  Service: Open Heart Surgery;  Laterality: N/A;  LIMA-LAD; SVG-OM; SVG-PD  . DIALYSIS FISTULA CREATION  08/04/2012 and 10/13   Dr. Harden Mo  . EYE SURGERY     Cataract  . INSERTION OF DIALYSIS CATHETER Right 01-15-14   Right chest TDC placed by Dr. Augustin Coupe at [facility]  . LIGATION OF ARTERIOVENOUS  FISTULA Right 07/15/2014   Procedure: LIGATION OF ARTERIOVENOUS  FISTULA  (RIGHT RADIOCEPHALIC);  Surgeon: Elam Dutch, MD;  Location: [place];  Service: Vascular;  Laterality: Right;  . LIGATION OF COMPETING BRANCHES OF ARTERIOVENOUS FISTULA Right 05/08/2014   Procedure: LIGATION OF COMPETING BRANCHES OF RIGHT ARM RADIOCEPHALIC ARTERIOVENOUS FISTULA;  Surgeon: Mal Misty, MD;  Location: [place];  Service: Vascular;  Laterality: Right;  . REVISON OF ARTERIOVENOUS FISTULA Right 07/15/2014   Procedure: EXPLORATION OF ARTERIOVENOUS FISTULA;  Surgeon: Elam Dutch, MD;  Location: [place];  Service: Vascular;  Laterality: Right;  . TEE WITHOUT CARDIOVERSION N/A 12/09/2014   Procedure: TRANSESOPHAGEAL ECHOCARDIOGRAM (TEE);  Surgeon: Gaye Pollack, MD;  Location: [place];  Service: Open Heart Surgery;  Laterality: N/A;     Medications: No outpatient medications have been marked as taking for the 01/05/17 encounter (Appointment) with Burtis Junes, NP.     Allergies: No Known Allergies  Social History: The patient  reports that  has never smoked. he has never used smokeless tobacco. He reports that he drinks about 1.2 oz of alcohol per week. He reports that he does not use drugs.   Family History: The patient's family history includes Cancer in his mother; Diabetes in his father; Lung cancer (age of onset: 82) in his father.   Review of Systems: Please see the history of present illness.   Otherwise, the  review of systems is positive for none.   All other systems are reviewed and negative.   Physical Exam: VS:  There were no vitals taken for this visit. Marland Kitchen  BMI There is no height or weight on file to calculate BMI.  Wt Readings from Last 3 Encounters:  11/10/16 176 lb (79.8 kg)  06/11/16 167 lb (75.8 kg)  05/12/16 167 lb (75.8 kg)    General: Pleasant. Well developed, well nourished and in no acute distress.   HEENT: Normal.  Neck: Supple, no JVD, carotid bruits, or masses noted.  Cardiac: Regular rate and rhythm. No murmurs, rubs, or gallops. No edema.  Respiratory:  Lungs are clear to auscultation bilaterally with normal work of breathing.  GI: Soft and nontender.  MS: No deformity or atrophy. Gait and ROM  intact.  Skin: Warm and dry. Color is normal.  Neuro:  Strength and sensation are intact and no gross focal deficits noted.  Psych: Alert, appropriate and with normal affect.   LABORATORY DATA:  EKG:  EKG is not ordered today.  Lab Results  Component Value Date   WBC 8.1 04/23/2015   HGB 13.2 (A) 04/23/2015   HCT 42 04/23/2015   PLT 154 04/23/2015   GLUCOSE 140 (H) 12/27/2014   CHOL 117 02/07/2013   TRIG 129 02/07/2013   HDL 36 02/07/2013   LDLCALC 55 02/07/2013   ALT 11 04/23/2015   AST 8 (A) 04/23/2015   NA 139 04/23/2015   K 5.0 04/23/2015   CL 100 12/27/2014   CREATININE 5.4 (A) 04/23/2015   BUN 39 (A) 04/23/2015   CO2 30 12/27/2014   INR 1.42 12/09/2014   HGBA1C 5.2 12/06/2014     BNP (last 3 results) No results for input(s): BNP in the last 8760 hours.  ProBNP (last 3 results) No results for input(s): PROBNP in the last 8760 hours.   Other Studies Reviewed Today:   Assessment/Plan:  Echo Study Conclusions 06/2016  - Procedure narrative: Transthoracic echocardiography. Image   quality was adequate. Intravenous contrast (Definity) was   administered. - Left ventricle: The cavity size was normal. Wall thickness was   increased in a pattern of  moderate LVH. Systolic function was   moderately reduced. The estimated ejection fraction was in the   range of 35% to 40%. Akinesis of the mid-apicalanteroseptal   myocardium. Doppler parameters are consistent with abnormal left   ventricular relaxation (grade 1 diastolic dysfunction). - Aortic valve: A bioprosthesis was present. There was trivial   regurgitation. - Mitral valve: There was mild regurgitation. - Right atrium: The atrium was mildly dilated.   Assessment/Plan:  1. History of AS/CAD - s/p CABG/AVR in 2016- echo from earlier this year ok.   2. Mild LV dysfunction - has resolved by most recent echo  3. ESRD - on dialysis  4. Chronic Anemia - remains on Aranesp - managed by Renal.   5. HLD - on statin therapy.  Current medicines are reviewed with the patient today.  The patient does not have concerns regarding medicines other than what has been noted above.  The following changes have been made:  See above.  Labs/ tests ordered today include:   No orders of the defined types were placed in this encounter.    Disposition:   FU with me in 6 months.  Patient is agreeable to this plan and will call if any problems develop in the interim.   SignedTruitt Merle, NP  01/05/2017 9:38 AM  [facility] [address] [place], [place]  [address] Phone: [phone] Fax: [phone]

## 2017-01-06 ENCOUNTER — Encounter: Payer: Self-pay | Admitting: Nurse Practitioner

## 2017-01-06 DIAGNOSIS — D631 Anemia in chronic kidney disease: Secondary | ICD-10-CM | POA: Diagnosis not present

## 2017-01-06 DIAGNOSIS — N186 End stage renal disease: Secondary | ICD-10-CM | POA: Diagnosis not present

## 2017-01-06 DIAGNOSIS — N184 Chronic kidney disease, stage 4 (severe): Secondary | ICD-10-CM | POA: Diagnosis not present

## 2017-01-06 DIAGNOSIS — N2581 Secondary hyperparathyroidism of renal origin: Secondary | ICD-10-CM | POA: Diagnosis not present

## 2017-01-06 DIAGNOSIS — D509 Iron deficiency anemia, unspecified: Secondary | ICD-10-CM | POA: Diagnosis not present

## 2017-01-08 DIAGNOSIS — N2581 Secondary hyperparathyroidism of renal origin: Secondary | ICD-10-CM | POA: Diagnosis not present

## 2017-01-08 DIAGNOSIS — N186 End stage renal disease: Secondary | ICD-10-CM | POA: Diagnosis not present

## 2017-01-08 DIAGNOSIS — D631 Anemia in chronic kidney disease: Secondary | ICD-10-CM | POA: Diagnosis not present

## 2017-01-08 DIAGNOSIS — D509 Iron deficiency anemia, unspecified: Secondary | ICD-10-CM | POA: Diagnosis not present

## 2017-01-08 DIAGNOSIS — N184 Chronic kidney disease, stage 4 (severe): Secondary | ICD-10-CM | POA: Diagnosis not present

## 2017-01-11 DIAGNOSIS — N2581 Secondary hyperparathyroidism of renal origin: Secondary | ICD-10-CM | POA: Diagnosis not present

## 2017-01-11 DIAGNOSIS — D631 Anemia in chronic kidney disease: Secondary | ICD-10-CM | POA: Diagnosis not present

## 2017-01-11 DIAGNOSIS — D509 Iron deficiency anemia, unspecified: Secondary | ICD-10-CM | POA: Diagnosis not present

## 2017-01-11 DIAGNOSIS — N186 End stage renal disease: Secondary | ICD-10-CM | POA: Diagnosis not present

## 2017-01-11 DIAGNOSIS — N184 Chronic kidney disease, stage 4 (severe): Secondary | ICD-10-CM | POA: Diagnosis not present

## 2017-01-13 DIAGNOSIS — D631 Anemia in chronic kidney disease: Secondary | ICD-10-CM | POA: Diagnosis not present

## 2017-01-13 DIAGNOSIS — N186 End stage renal disease: Secondary | ICD-10-CM | POA: Diagnosis not present

## 2017-01-13 DIAGNOSIS — N2581 Secondary hyperparathyroidism of renal origin: Secondary | ICD-10-CM | POA: Diagnosis not present

## 2017-01-13 DIAGNOSIS — D509 Iron deficiency anemia, unspecified: Secondary | ICD-10-CM | POA: Diagnosis not present

## 2017-01-13 DIAGNOSIS — N184 Chronic kidney disease, stage 4 (severe): Secondary | ICD-10-CM | POA: Diagnosis not present

## 2017-01-15 DIAGNOSIS — D631 Anemia in chronic kidney disease: Secondary | ICD-10-CM | POA: Diagnosis not present

## 2017-01-15 DIAGNOSIS — N186 End stage renal disease: Secondary | ICD-10-CM | POA: Diagnosis not present

## 2017-01-15 DIAGNOSIS — D509 Iron deficiency anemia, unspecified: Secondary | ICD-10-CM | POA: Diagnosis not present

## 2017-01-15 DIAGNOSIS — N2581 Secondary hyperparathyroidism of renal origin: Secondary | ICD-10-CM | POA: Diagnosis not present

## 2017-01-15 DIAGNOSIS — N184 Chronic kidney disease, stage 4 (severe): Secondary | ICD-10-CM | POA: Diagnosis not present

## 2017-01-18 DIAGNOSIS — N2581 Secondary hyperparathyroidism of renal origin: Secondary | ICD-10-CM | POA: Diagnosis not present

## 2017-01-18 DIAGNOSIS — N184 Chronic kidney disease, stage 4 (severe): Secondary | ICD-10-CM | POA: Diagnosis not present

## 2017-01-18 DIAGNOSIS — D509 Iron deficiency anemia, unspecified: Secondary | ICD-10-CM | POA: Diagnosis not present

## 2017-01-18 DIAGNOSIS — D631 Anemia in chronic kidney disease: Secondary | ICD-10-CM | POA: Diagnosis not present

## 2017-01-18 DIAGNOSIS — N186 End stage renal disease: Secondary | ICD-10-CM | POA: Diagnosis not present

## 2017-01-20 DIAGNOSIS — N184 Chronic kidney disease, stage 4 (severe): Secondary | ICD-10-CM | POA: Diagnosis not present

## 2017-01-20 DIAGNOSIS — D509 Iron deficiency anemia, unspecified: Secondary | ICD-10-CM | POA: Diagnosis not present

## 2017-01-20 DIAGNOSIS — N186 End stage renal disease: Secondary | ICD-10-CM | POA: Diagnosis not present

## 2017-01-20 DIAGNOSIS — N2581 Secondary hyperparathyroidism of renal origin: Secondary | ICD-10-CM | POA: Diagnosis not present

## 2017-01-20 DIAGNOSIS — D631 Anemia in chronic kidney disease: Secondary | ICD-10-CM | POA: Diagnosis not present

## 2017-01-22 DIAGNOSIS — N186 End stage renal disease: Secondary | ICD-10-CM | POA: Diagnosis not present

## 2017-01-22 DIAGNOSIS — N2581 Secondary hyperparathyroidism of renal origin: Secondary | ICD-10-CM | POA: Diagnosis not present

## 2017-01-22 DIAGNOSIS — D509 Iron deficiency anemia, unspecified: Secondary | ICD-10-CM | POA: Diagnosis not present

## 2017-01-22 DIAGNOSIS — D631 Anemia in chronic kidney disease: Secondary | ICD-10-CM | POA: Diagnosis not present

## 2017-01-22 DIAGNOSIS — N184 Chronic kidney disease, stage 4 (severe): Secondary | ICD-10-CM | POA: Diagnosis not present

## 2017-01-25 DIAGNOSIS — D631 Anemia in chronic kidney disease: Secondary | ICD-10-CM | POA: Diagnosis not present

## 2017-01-25 DIAGNOSIS — N186 End stage renal disease: Secondary | ICD-10-CM | POA: Diagnosis not present

## 2017-01-25 DIAGNOSIS — D509 Iron deficiency anemia, unspecified: Secondary | ICD-10-CM | POA: Diagnosis not present

## 2017-01-25 DIAGNOSIS — N184 Chronic kidney disease, stage 4 (severe): Secondary | ICD-10-CM | POA: Diagnosis not present

## 2017-01-25 DIAGNOSIS — N2581 Secondary hyperparathyroidism of renal origin: Secondary | ICD-10-CM | POA: Diagnosis not present

## 2017-01-27 DIAGNOSIS — N2581 Secondary hyperparathyroidism of renal origin: Secondary | ICD-10-CM | POA: Diagnosis not present

## 2017-01-27 DIAGNOSIS — N184 Chronic kidney disease, stage 4 (severe): Secondary | ICD-10-CM | POA: Diagnosis not present

## 2017-01-27 DIAGNOSIS — N186 End stage renal disease: Secondary | ICD-10-CM | POA: Diagnosis not present

## 2017-01-27 DIAGNOSIS — D509 Iron deficiency anemia, unspecified: Secondary | ICD-10-CM | POA: Diagnosis not present

## 2017-01-27 DIAGNOSIS — D631 Anemia in chronic kidney disease: Secondary | ICD-10-CM | POA: Diagnosis not present

## 2017-01-29 DIAGNOSIS — D631 Anemia in chronic kidney disease: Secondary | ICD-10-CM | POA: Diagnosis not present

## 2017-01-29 DIAGNOSIS — N186 End stage renal disease: Secondary | ICD-10-CM | POA: Diagnosis not present

## 2017-01-29 DIAGNOSIS — D509 Iron deficiency anemia, unspecified: Secondary | ICD-10-CM | POA: Diagnosis not present

## 2017-01-29 DIAGNOSIS — N2581 Secondary hyperparathyroidism of renal origin: Secondary | ICD-10-CM | POA: Diagnosis not present

## 2017-01-29 DIAGNOSIS — N184 Chronic kidney disease, stage 4 (severe): Secondary | ICD-10-CM | POA: Diagnosis not present

## 2017-02-01 DIAGNOSIS — N2581 Secondary hyperparathyroidism of renal origin: Secondary | ICD-10-CM | POA: Diagnosis not present

## 2017-02-01 DIAGNOSIS — D631 Anemia in chronic kidney disease: Secondary | ICD-10-CM | POA: Diagnosis not present

## 2017-02-01 DIAGNOSIS — D509 Iron deficiency anemia, unspecified: Secondary | ICD-10-CM | POA: Diagnosis not present

## 2017-02-01 DIAGNOSIS — N186 End stage renal disease: Secondary | ICD-10-CM | POA: Diagnosis not present

## 2017-02-01 DIAGNOSIS — N184 Chronic kidney disease, stage 4 (severe): Secondary | ICD-10-CM | POA: Diagnosis not present

## 2017-02-03 DIAGNOSIS — Z992 Dependence on renal dialysis: Secondary | ICD-10-CM | POA: Diagnosis not present

## 2017-02-03 DIAGNOSIS — N2889 Other specified disorders of kidney and ureter: Secondary | ICD-10-CM | POA: Diagnosis not present

## 2017-02-03 DIAGNOSIS — D631 Anemia in chronic kidney disease: Secondary | ICD-10-CM | POA: Diagnosis not present

## 2017-02-03 DIAGNOSIS — N186 End stage renal disease: Secondary | ICD-10-CM | POA: Diagnosis not present

## 2017-02-03 DIAGNOSIS — N2581 Secondary hyperparathyroidism of renal origin: Secondary | ICD-10-CM | POA: Diagnosis not present

## 2017-02-03 DIAGNOSIS — D509 Iron deficiency anemia, unspecified: Secondary | ICD-10-CM | POA: Diagnosis not present

## 2017-02-03 DIAGNOSIS — N184 Chronic kidney disease, stage 4 (severe): Secondary | ICD-10-CM | POA: Diagnosis not present

## 2017-02-04 DIAGNOSIS — N2889 Other specified disorders of kidney and ureter: Secondary | ICD-10-CM | POA: Diagnosis not present

## 2017-02-04 DIAGNOSIS — N186 End stage renal disease: Secondary | ICD-10-CM | POA: Diagnosis not present

## 2017-02-04 DIAGNOSIS — Z992 Dependence on renal dialysis: Secondary | ICD-10-CM | POA: Diagnosis not present

## 2017-02-04 LAB — CBC AND DIFFERENTIAL: HEMOGLOBIN: 12.4 — AB (ref 13.5–17.5)

## 2017-02-04 LAB — BASIC METABOLIC PANEL: Potassium: 5 (ref 3.4–5.3)

## 2017-02-05 DIAGNOSIS — N184 Chronic kidney disease, stage 4 (severe): Secondary | ICD-10-CM | POA: Diagnosis not present

## 2017-02-05 DIAGNOSIS — D631 Anemia in chronic kidney disease: Secondary | ICD-10-CM | POA: Diagnosis not present

## 2017-02-05 DIAGNOSIS — N2581 Secondary hyperparathyroidism of renal origin: Secondary | ICD-10-CM | POA: Diagnosis not present

## 2017-02-05 DIAGNOSIS — D509 Iron deficiency anemia, unspecified: Secondary | ICD-10-CM | POA: Diagnosis not present

## 2017-02-05 DIAGNOSIS — N186 End stage renal disease: Secondary | ICD-10-CM | POA: Diagnosis not present

## 2017-02-07 ENCOUNTER — Ambulatory Visit (INDEPENDENT_AMBULATORY_CARE_PROVIDER_SITE_OTHER): Payer: Medicare Other | Admitting: Nurse Practitioner

## 2017-02-07 ENCOUNTER — Encounter: Payer: Self-pay | Admitting: Nurse Practitioner

## 2017-02-07 VITALS — BP 112/60 | HR 66 | Wt 179.8 lb

## 2017-02-07 DIAGNOSIS — E7849 Other hyperlipidemia: Secondary | ICD-10-CM

## 2017-02-07 LAB — LIPID PANEL
Chol/HDL Ratio: 3.4 ratio (ref 0.0–5.0)
Cholesterol, Total: 125 mg/dL (ref 100–199)
HDL: 37 mg/dL — ABNORMAL LOW (ref >39)
LDL Calculated: 72 mg/dL (ref 0–99)
Triglycerides: 80 mg/dL (ref 0–149)
VLDL Cholesterol Cal: 16 mg/dL (ref 5–40)

## 2017-02-07 LAB — HEPATIC FUNCTION PANEL
ALT: 9 IU/L (ref 0–44)
AST: 10 IU/L (ref 0–40)
Albumin: 4 g/dL (ref 3.5–4.7)
Alkaline Phosphatase: 65 IU/L (ref 39–117)
Bilirubin Total: 0.3 mg/dL (ref 0.0–1.2)
Bilirubin, Direct: 0.11 mg/dL (ref 0.00–0.40)
Total Protein: 6.8 g/dL (ref 6.0–8.5)

## 2017-02-07 NOTE — Progress Notes (Signed)
CARDIOLOGY OFFICE NOTE  Date:  02/07/2017    [name] Date of Birth: [birth year] Medical Record #[id number]  PCP:  Gayland Curry, DO  Cardiologist:  Ree Shay   Chief Complaint  Patient presents with  . Coronary Artery Disease  . Aortic Stenosis    Follow up visit - seen for Dr. Caryl Comes    History of Present Illness: Corrigan Kretschmer is a 84 y.o. male who presents today for a follow up visit. Seen for Dr. Caryl Comes.   He has a history of ESRD on HD via AV fistula, insomnia, HTN, and GERD. Reported stress test at [facility] in [place], [place] 2 summers ago - noted in the record that he "has a problem with the one chamber of his heart not being attached properly and he knows about it since childhood". No actual documentation of this. Reports history of cardiac arrest approximately 2 years ago while having fistula procedure - sounds like he had a stress test at that time.   I saw him originally in November of 2016 as a new patient for DOE - had abnormal EKG and murmur on exam. Ended up getting echo, CT (negative for PE) and cath.   Cardiac cath on 11/27/2014 showed severe 3-vessel coronary disease. The proximal and mid LAD were heavily calcified with 80% proximal and 99% mid LAD stenosis followed by a 90% mid stenosis. The LCX had a 60% stenosis in a large OM2 and an occluded OM3 that long but small in diameter or underfilled by the collat from the LAD. The RCA is occluded distally with faint filling of a small diameter PDA branch by collat from the LAD. His echo showed at least moderate AS with a mean gradient of 36 mm Hg and a DI of 0.21. LVEF was reduced to 35% with anterior, septal and inferior wall motion abnormalities.   He underwent CABG x 3 with LIMA to LAD, SVG to OM2 and SVG to PDA with AVR with #23 Edwards Magna-Ease pericardial valve by Dr. Cyndia Bent on 12/09/2014. Nephrology assisted with his dialysis. He did initially require some inotropic support but this  was able to be weaned over time without difficulty. He had no significant postoperative cardiac dysrhythmias. He is on chronic Aranesp managed by the nephrologist.   I last saw him in May of 2017 - felt to be doing ok.   Saw Dr. Caryl Comes in June of 2018 - endorsed lots of fatigue - echo updated - this turned out ok.   Comes back today. Here with his wife. He is doing ok. Still fatigued. Sleeps now during his dialysis. Napping more and so he does not sleep well at night. Very little activity but he is trying. No chest pain. Breathing is ok for the most part. He is tolerating his dialysis - does not have too much issue with dizziness or not being able to get his whole treatment. Overall, he feels like he is doing ok.   Past Medical History:  Diagnosis Date  . Anxiety   . Arthritis   . CKD (chronic kidney disease) stage 5, GFR less than 15 ml/min (HCC)   . Complication of anesthesia    " one time my heart stopped due to a medication that I was on."   . GERD (gastroesophageal reflux disease)   . Gout   . Heart murmur    as a teen  . Hemodialysis patient [facility])    Tues, Thursday, Saturday  . Herpes  zoster 04/2012  . Hypertension   . Shortness of breath dyspnea    with exertion  . Unspecified essential hypertension     Past Surgical History:  Procedure Laterality Date  . AORTIC VALVE REPLACEMENT N/A 12/09/2014   Procedure: AORTIC VALVE REPLACEMENT (AVR) WITH 23MM MAGNA EASE BIOPROSTHETIC VALVE;  Surgeon: Gaye Pollack, MD;  Location: [place] OR;  Service: Open Heart Surgery;  Laterality: N/A;  . APPENDECTOMY  1944  . AV FISTULA PLACEMENT Left    Left Cimino AVF placed in [place]   . AV FISTULA PLACEMENT Right 03/06/2014   Procedure: ARTERIOVENOUS (AV) FISTULA CREATION-right radiocephalic;  Surgeon: Mal Misty, MD;  Location: [facility] OR;  Service: Vascular;  Laterality: Right;  . AV FISTULA PLACEMENT Right 07/15/2014   Procedure: ARTERIOVENOUS (AV) FISTULA CREATION;  Surgeon: Elam Dutch, MD;  Location: [place];  Service: Vascular;  Laterality: Right;  . CARDIAC CATHETERIZATION N/A 11/27/2014   Procedure: Right/Left Heart Cath and Coronary Angiography;  Surgeon: Burnell Blanks, MD;  Location: [place] CV LAB;  Service: Cardiovascular;  Laterality: N/A;  . CATARACT EXTRACTION W/ INTRAOCULAR LENS IMPLANT Bilateral 2014   [facility]  . COLONOSCOPY  2013   Dr. Annamaria Helling [place], [place].  . CORONARY ARTERY BYPASS GRAFT N/A 12/09/2014   Procedure: CORONARY ARTERY BYPASS GRAFTING (CABG), ON PUMP, TIMES THREE, USING LEFT INTERNAL MAMMARY ARTERY, RIGHT GREATER SAPHENOUS VEIN HARVESTED ENDOSCOPICALLY;  Surgeon: Gaye Pollack, MD;  Location: [place];  Service: Open Heart Surgery;  Laterality: N/A;  LIMA-LAD; SVG-OM; SVG-PD  . DIALYSIS FISTULA CREATION  08/04/2012 and 10/13   Dr. Harden Mo  . EYE SURGERY     Cataract  . INSERTION OF DIALYSIS CATHETER Right 01-15-14   Right chest TDC placed by Dr. Augustin Coupe at [facility]  . LIGATION OF ARTERIOVENOUS  FISTULA Right 07/15/2014   Procedure: LIGATION OF ARTERIOVENOUS  FISTULA  (RIGHT RADIOCEPHALIC);  Surgeon: Elam Dutch, MD;  Location: [place];  Service: Vascular;  Laterality: Right;  . LIGATION OF COMPETING BRANCHES OF ARTERIOVENOUS FISTULA Right 05/08/2014   Procedure: LIGATION OF COMPETING BRANCHES OF RIGHT ARM RADIOCEPHALIC ARTERIOVENOUS FISTULA;  Surgeon: Mal Misty, MD;  Location: [place];  Service: Vascular;  Laterality: Right;  . REVISON OF ARTERIOVENOUS FISTULA Right 07/15/2014   Procedure: EXPLORATION OF ARTERIOVENOUS FISTULA;  Surgeon: Elam Dutch, MD;  Location: [place];  Service: Vascular;  Laterality: Right;  . TEE WITHOUT CARDIOVERSION N/A 12/09/2014   Procedure: TRANSESOPHAGEAL ECHOCARDIOGRAM (TEE);  Surgeon: Gaye Pollack, MD;  Location: [place];  Service: Open Heart Surgery;  Laterality: N/A;     Medications: Current Meds  Medication Sig  . allopurinol (ZYLOPRIM) 100 MG tablet TAKE 1 TABLET BY MOUTH  DAILY  . amLODipine (NORVASC) 10 MG tablet TAKE 1 TABLET BY MOUTH DAILY TO CONTROL BLOOD PRESSURE  . atorvastatin (LIPITOR) 20 MG tablet TAKE 1 TABLET BY MOUTH DAILY FOR CHOLESTEROL  . calcium carbonate (TUMS - DOSED IN MG ELEMENTAL CALCIUM) 500 MG chewable tablet Chew 2 tablets by mouth 3 (three) times daily with meals.  . cinacalcet (SENSIPAR) 30 MG tablet Take 30 mg by mouth daily.  Marland Kitchen loratadine (CLARITIN) 10 MG tablet Take 10 mg daily by mouth.  . metoprolol tartrate (LOPRESSOR) 25 MG tablet TAKE (1/2) TABLET TWICE DAILY.  Marland Kitchen omeprazole (PRILOSEC) 20 MG capsule Take 20 mg by mouth daily.      Allergies: No Known Allergies  Social History: The patient  reports that  has never smoked. he has never used smokeless tobacco. He reports that he drinks about 1.2 oz of alcohol per week. He reports that he does not use drugs.   Family History: The patient's family history includes Cancer in his mother; Diabetes in his father; Lung cancer (age of onset: 29) in his father.   Review of Systems: Please see the history of present illness.   Otherwise, the review of systems is positive for none.   All other systems are reviewed and negative.   Physical Exam: VS:  BP 112/60 (BP Location: Left Arm, Patient Position: Sitting, Cuff Size: Normal)   Pulse 66   Wt 179 lb 12.8 oz (81.6 kg)   SpO2 90% Comment: at rest  BMI 26.55 kg/m  .  BMI Body mass index is 26.55 kg/m.  Wt Readings from Last 3 Encounters:  02/07/17 179 lb 12.8 oz (81.6 kg)  11/10/16 176 lb (79.8 kg)  06/11/16 167 lb (75.8 kg)    General: Pleasant. Chronically ill appearing but alert and in no acute distress.   HEENT: Normal.  Neck: Supple, no JVD, carotid bruits, or masses noted.  Cardiac: Regular rate and rhythm. Outflow murmur noted. No edema.  Respiratory:  Lungs are clear to auscultation bilaterally with normal work of breathing.  GI: Soft and nontender.  MS: No deformity or atrophy. Gait and ROM intact.  Skin: Warm and  dry. Color is sallow.  Neuro:  Strength and sensation are intact and no gross focal deficits noted.  Psych: Alert, appropriate and with normal affect.   LABORATORY DATA:  EKG:  EKG is not ordered today.   Lab Results  Component Value Date   WBC 8.1 04/23/2015   HGB 13.2 (A) 04/23/2015   HCT 42 04/23/2015   PLT 154 04/23/2015   GLUCOSE 140 (H) 12/27/2014   CHOL 117 02/07/2013   TRIG 129 02/07/2013   HDL 36 02/07/2013   LDLCALC 55 02/07/2013   ALT 11 04/23/2015   AST 8 (A) 04/23/2015   NA 139 04/23/2015   K 5.0 04/23/2015   CL 100 12/27/2014   CREATININE 5.4 (A) 04/23/2015   BUN 39 (A) 04/23/2015   CO2 30 12/27/2014   INR 1.42 12/09/2014   HGBA1C 5.2 12/06/2014     BNP (last 3 results) No results for input(s): BNP in the last 8760 hours.  ProBNP (last 3 results) No results for input(s): PROBNP in the last 8760 hours.   Other Studies Reviewed Today:  Echo Study Conclusions 06/2016  - Procedure narrative: Transthoracic echocardiography. Image   quality was adequate. Intravenous contrast (Definity) was   administered. - Left ventricle: The cavity size was normal. Wall thickness was   increased in a pattern of moderate LVH. Systolic function was   moderately reduced. The estimated ejection fraction was in the   range of 35% to 40%. Akinesis of the mid-apicalanteroseptal   myocardium. Doppler parameters are consistent with abnormal left   ventricular relaxation (grade 1 diastolic dysfunction). - Aortic valve: A bioprosthesis was present. There was trivial   regurgitation. - Mitral valve: There was mild regurgitation. - Right atrium: The atrium was mildly dilated.    Assessment/Plan:  1. CAD/AS - s/p CABG and AVR - he is doing ok clinically. I suspect his fatigue is more multifactorial given his situation.   2. Mild LV dysfunction - managed by dialysis. Not really short of breath.   3. ESRD - on dialysis - tolerating ok.   4. Chronic Anemia - remains  on Aranesp - managed by Renal.   5. HLD - on statin therapy. Needs lipids checked.   Overall, he seems to be holding his own. No changes made today. He does not wish to come back for "a while".   Current medicines are reviewed with the patient today.  The patient does not have concerns regarding medicines other than what has been noted above.  The following changes have been made:  See above.  Labs/ tests ordered today include:    Orders Placed This Encounter  Procedures  . Lipid panel  . Hepatic function panel     Disposition:   FU with me in 8 months.   Patient is agreeable to this plan and will call if any problems develop in the interim.   SignedTruitt Merle, NP  02/07/2017 11:02 AM  [facility] [address] [place], [place]  [address] Phone: [phone] Fax: [phone]

## 2017-02-07 NOTE — Patient Instructions (Addendum)
We will be checking the following labs today - lipids and LFTs   Medication Instructions:    Continue with your current medicines.     Testing/Procedures To Be Arranged:  N/A  Follow-Up:   See me in 8 months    Other Special Instructions:   Stay as active    If you need a refill on your cardiac medications before your next appointment, please call your pharmacy.   Call the [place] office at [phone] if you have any questions, problems or concerns.

## 2017-02-08 DIAGNOSIS — N184 Chronic kidney disease, stage 4 (severe): Secondary | ICD-10-CM | POA: Diagnosis not present

## 2017-02-08 DIAGNOSIS — D631 Anemia in chronic kidney disease: Secondary | ICD-10-CM | POA: Diagnosis not present

## 2017-02-08 DIAGNOSIS — D509 Iron deficiency anemia, unspecified: Secondary | ICD-10-CM | POA: Diagnosis not present

## 2017-02-08 DIAGNOSIS — N186 End stage renal disease: Secondary | ICD-10-CM | POA: Diagnosis not present

## 2017-02-08 DIAGNOSIS — N2581 Secondary hyperparathyroidism of renal origin: Secondary | ICD-10-CM | POA: Diagnosis not present

## 2017-02-10 DIAGNOSIS — N2581 Secondary hyperparathyroidism of renal origin: Secondary | ICD-10-CM | POA: Diagnosis not present

## 2017-02-10 DIAGNOSIS — N184 Chronic kidney disease, stage 4 (severe): Secondary | ICD-10-CM | POA: Diagnosis not present

## 2017-02-10 DIAGNOSIS — D509 Iron deficiency anemia, unspecified: Secondary | ICD-10-CM | POA: Diagnosis not present

## 2017-02-10 DIAGNOSIS — D631 Anemia in chronic kidney disease: Secondary | ICD-10-CM | POA: Diagnosis not present

## 2017-02-10 DIAGNOSIS — N186 End stage renal disease: Secondary | ICD-10-CM | POA: Diagnosis not present

## 2017-02-12 DIAGNOSIS — N184 Chronic kidney disease, stage 4 (severe): Secondary | ICD-10-CM | POA: Diagnosis not present

## 2017-02-12 DIAGNOSIS — D631 Anemia in chronic kidney disease: Secondary | ICD-10-CM | POA: Diagnosis not present

## 2017-02-12 DIAGNOSIS — D509 Iron deficiency anemia, unspecified: Secondary | ICD-10-CM | POA: Diagnosis not present

## 2017-02-12 DIAGNOSIS — N186 End stage renal disease: Secondary | ICD-10-CM | POA: Diagnosis not present

## 2017-02-12 DIAGNOSIS — N2581 Secondary hyperparathyroidism of renal origin: Secondary | ICD-10-CM | POA: Diagnosis not present

## 2017-02-15 DIAGNOSIS — D509 Iron deficiency anemia, unspecified: Secondary | ICD-10-CM | POA: Diagnosis not present

## 2017-02-15 DIAGNOSIS — N184 Chronic kidney disease, stage 4 (severe): Secondary | ICD-10-CM | POA: Diagnosis not present

## 2017-02-15 DIAGNOSIS — N2581 Secondary hyperparathyroidism of renal origin: Secondary | ICD-10-CM | POA: Diagnosis not present

## 2017-02-15 DIAGNOSIS — N186 End stage renal disease: Secondary | ICD-10-CM | POA: Diagnosis not present

## 2017-02-15 DIAGNOSIS — D631 Anemia in chronic kidney disease: Secondary | ICD-10-CM | POA: Diagnosis not present

## 2017-02-17 DIAGNOSIS — N184 Chronic kidney disease, stage 4 (severe): Secondary | ICD-10-CM | POA: Diagnosis not present

## 2017-02-17 DIAGNOSIS — D631 Anemia in chronic kidney disease: Secondary | ICD-10-CM | POA: Diagnosis not present

## 2017-02-17 DIAGNOSIS — N2581 Secondary hyperparathyroidism of renal origin: Secondary | ICD-10-CM | POA: Diagnosis not present

## 2017-02-17 DIAGNOSIS — N186 End stage renal disease: Secondary | ICD-10-CM | POA: Diagnosis not present

## 2017-02-17 DIAGNOSIS — D509 Iron deficiency anemia, unspecified: Secondary | ICD-10-CM | POA: Diagnosis not present

## 2017-02-19 DIAGNOSIS — D509 Iron deficiency anemia, unspecified: Secondary | ICD-10-CM | POA: Diagnosis not present

## 2017-02-19 DIAGNOSIS — N184 Chronic kidney disease, stage 4 (severe): Secondary | ICD-10-CM | POA: Diagnosis not present

## 2017-02-19 DIAGNOSIS — N186 End stage renal disease: Secondary | ICD-10-CM | POA: Diagnosis not present

## 2017-02-19 DIAGNOSIS — N2581 Secondary hyperparathyroidism of renal origin: Secondary | ICD-10-CM | POA: Diagnosis not present

## 2017-02-19 DIAGNOSIS — D631 Anemia in chronic kidney disease: Secondary | ICD-10-CM | POA: Diagnosis not present

## 2017-02-22 DIAGNOSIS — D631 Anemia in chronic kidney disease: Secondary | ICD-10-CM | POA: Diagnosis not present

## 2017-02-22 DIAGNOSIS — N184 Chronic kidney disease, stage 4 (severe): Secondary | ICD-10-CM | POA: Diagnosis not present

## 2017-02-22 DIAGNOSIS — N2581 Secondary hyperparathyroidism of renal origin: Secondary | ICD-10-CM | POA: Diagnosis not present

## 2017-02-22 DIAGNOSIS — D509 Iron deficiency anemia, unspecified: Secondary | ICD-10-CM | POA: Diagnosis not present

## 2017-02-22 DIAGNOSIS — N186 End stage renal disease: Secondary | ICD-10-CM | POA: Diagnosis not present

## 2017-02-24 DIAGNOSIS — N186 End stage renal disease: Secondary | ICD-10-CM | POA: Diagnosis not present

## 2017-02-24 DIAGNOSIS — D509 Iron deficiency anemia, unspecified: Secondary | ICD-10-CM | POA: Diagnosis not present

## 2017-02-24 DIAGNOSIS — N184 Chronic kidney disease, stage 4 (severe): Secondary | ICD-10-CM | POA: Diagnosis not present

## 2017-02-24 DIAGNOSIS — D631 Anemia in chronic kidney disease: Secondary | ICD-10-CM | POA: Diagnosis not present

## 2017-02-24 DIAGNOSIS — N2581 Secondary hyperparathyroidism of renal origin: Secondary | ICD-10-CM | POA: Diagnosis not present

## 2017-02-26 DIAGNOSIS — D509 Iron deficiency anemia, unspecified: Secondary | ICD-10-CM | POA: Diagnosis not present

## 2017-02-26 DIAGNOSIS — D631 Anemia in chronic kidney disease: Secondary | ICD-10-CM | POA: Diagnosis not present

## 2017-02-26 DIAGNOSIS — N186 End stage renal disease: Secondary | ICD-10-CM | POA: Diagnosis not present

## 2017-02-26 DIAGNOSIS — N2581 Secondary hyperparathyroidism of renal origin: Secondary | ICD-10-CM | POA: Diagnosis not present

## 2017-02-26 DIAGNOSIS — N184 Chronic kidney disease, stage 4 (severe): Secondary | ICD-10-CM | POA: Diagnosis not present

## 2017-02-28 ENCOUNTER — Other Ambulatory Visit: Payer: Self-pay | Admitting: Internal Medicine

## 2017-03-01 DIAGNOSIS — N186 End stage renal disease: Secondary | ICD-10-CM | POA: Diagnosis not present

## 2017-03-01 DIAGNOSIS — D509 Iron deficiency anemia, unspecified: Secondary | ICD-10-CM | POA: Diagnosis not present

## 2017-03-01 DIAGNOSIS — N2581 Secondary hyperparathyroidism of renal origin: Secondary | ICD-10-CM | POA: Diagnosis not present

## 2017-03-01 DIAGNOSIS — D631 Anemia in chronic kidney disease: Secondary | ICD-10-CM | POA: Diagnosis not present

## 2017-03-01 DIAGNOSIS — N184 Chronic kidney disease, stage 4 (severe): Secondary | ICD-10-CM | POA: Diagnosis not present

## 2017-03-03 DIAGNOSIS — D509 Iron deficiency anemia, unspecified: Secondary | ICD-10-CM | POA: Diagnosis not present

## 2017-03-03 DIAGNOSIS — N184 Chronic kidney disease, stage 4 (severe): Secondary | ICD-10-CM | POA: Diagnosis not present

## 2017-03-03 DIAGNOSIS — N2581 Secondary hyperparathyroidism of renal origin: Secondary | ICD-10-CM | POA: Diagnosis not present

## 2017-03-03 DIAGNOSIS — N186 End stage renal disease: Secondary | ICD-10-CM | POA: Diagnosis not present

## 2017-03-03 DIAGNOSIS — D631 Anemia in chronic kidney disease: Secondary | ICD-10-CM | POA: Diagnosis not present

## 2017-03-04 DIAGNOSIS — D631 Anemia in chronic kidney disease: Secondary | ICD-10-CM | POA: Diagnosis not present

## 2017-03-04 DIAGNOSIS — N184 Chronic kidney disease, stage 4 (severe): Secondary | ICD-10-CM | POA: Diagnosis not present

## 2017-03-04 DIAGNOSIS — Z992 Dependence on renal dialysis: Secondary | ICD-10-CM | POA: Diagnosis not present

## 2017-03-04 DIAGNOSIS — N2581 Secondary hyperparathyroidism of renal origin: Secondary | ICD-10-CM | POA: Diagnosis not present

## 2017-03-04 DIAGNOSIS — N2889 Other specified disorders of kidney and ureter: Secondary | ICD-10-CM | POA: Diagnosis not present

## 2017-03-04 DIAGNOSIS — N186 End stage renal disease: Secondary | ICD-10-CM | POA: Diagnosis not present

## 2017-03-04 LAB — BASIC METABOLIC PANEL: POTASSIUM: 4.7 (ref 3.4–5.3)

## 2017-03-04 LAB — CBC AND DIFFERENTIAL: Hemoglobin: 11.8 — AB (ref 13.5–17.5)

## 2017-03-05 DIAGNOSIS — N186 End stage renal disease: Secondary | ICD-10-CM | POA: Diagnosis not present

## 2017-03-05 DIAGNOSIS — D631 Anemia in chronic kidney disease: Secondary | ICD-10-CM | POA: Diagnosis not present

## 2017-03-05 DIAGNOSIS — N2581 Secondary hyperparathyroidism of renal origin: Secondary | ICD-10-CM | POA: Diagnosis not present

## 2017-03-05 DIAGNOSIS — N184 Chronic kidney disease, stage 4 (severe): Secondary | ICD-10-CM | POA: Diagnosis not present

## 2017-03-08 DIAGNOSIS — N184 Chronic kidney disease, stage 4 (severe): Secondary | ICD-10-CM | POA: Diagnosis not present

## 2017-03-08 DIAGNOSIS — N186 End stage renal disease: Secondary | ICD-10-CM | POA: Diagnosis not present

## 2017-03-08 DIAGNOSIS — D631 Anemia in chronic kidney disease: Secondary | ICD-10-CM | POA: Diagnosis not present

## 2017-03-08 DIAGNOSIS — N2581 Secondary hyperparathyroidism of renal origin: Secondary | ICD-10-CM | POA: Diagnosis not present

## 2017-03-10 DIAGNOSIS — N2581 Secondary hyperparathyroidism of renal origin: Secondary | ICD-10-CM | POA: Diagnosis not present

## 2017-03-10 DIAGNOSIS — N184 Chronic kidney disease, stage 4 (severe): Secondary | ICD-10-CM | POA: Diagnosis not present

## 2017-03-10 DIAGNOSIS — D631 Anemia in chronic kidney disease: Secondary | ICD-10-CM | POA: Diagnosis not present

## 2017-03-10 DIAGNOSIS — N186 End stage renal disease: Secondary | ICD-10-CM | POA: Diagnosis not present

## 2017-03-12 DIAGNOSIS — N186 End stage renal disease: Secondary | ICD-10-CM | POA: Diagnosis not present

## 2017-03-12 DIAGNOSIS — N184 Chronic kidney disease, stage 4 (severe): Secondary | ICD-10-CM | POA: Diagnosis not present

## 2017-03-12 DIAGNOSIS — N2581 Secondary hyperparathyroidism of renal origin: Secondary | ICD-10-CM | POA: Diagnosis not present

## 2017-03-12 DIAGNOSIS — D631 Anemia in chronic kidney disease: Secondary | ICD-10-CM | POA: Diagnosis not present

## 2017-03-15 DIAGNOSIS — N184 Chronic kidney disease, stage 4 (severe): Secondary | ICD-10-CM | POA: Diagnosis not present

## 2017-03-15 DIAGNOSIS — N2581 Secondary hyperparathyroidism of renal origin: Secondary | ICD-10-CM | POA: Diagnosis not present

## 2017-03-15 DIAGNOSIS — D631 Anemia in chronic kidney disease: Secondary | ICD-10-CM | POA: Diagnosis not present

## 2017-03-15 DIAGNOSIS — N186 End stage renal disease: Secondary | ICD-10-CM | POA: Diagnosis not present

## 2017-03-17 DIAGNOSIS — N184 Chronic kidney disease, stage 4 (severe): Secondary | ICD-10-CM | POA: Diagnosis not present

## 2017-03-17 DIAGNOSIS — N2581 Secondary hyperparathyroidism of renal origin: Secondary | ICD-10-CM | POA: Diagnosis not present

## 2017-03-17 DIAGNOSIS — N186 End stage renal disease: Secondary | ICD-10-CM | POA: Diagnosis not present

## 2017-03-17 DIAGNOSIS — D631 Anemia in chronic kidney disease: Secondary | ICD-10-CM | POA: Diagnosis not present

## 2017-03-19 DIAGNOSIS — N184 Chronic kidney disease, stage 4 (severe): Secondary | ICD-10-CM | POA: Diagnosis not present

## 2017-03-19 DIAGNOSIS — N2581 Secondary hyperparathyroidism of renal origin: Secondary | ICD-10-CM | POA: Diagnosis not present

## 2017-03-19 DIAGNOSIS — D631 Anemia in chronic kidney disease: Secondary | ICD-10-CM | POA: Diagnosis not present

## 2017-03-19 DIAGNOSIS — N186 End stage renal disease: Secondary | ICD-10-CM | POA: Diagnosis not present

## 2017-03-22 DIAGNOSIS — D631 Anemia in chronic kidney disease: Secondary | ICD-10-CM | POA: Diagnosis not present

## 2017-03-22 DIAGNOSIS — N2581 Secondary hyperparathyroidism of renal origin: Secondary | ICD-10-CM | POA: Diagnosis not present

## 2017-03-22 DIAGNOSIS — N186 End stage renal disease: Secondary | ICD-10-CM | POA: Diagnosis not present

## 2017-03-22 DIAGNOSIS — N184 Chronic kidney disease, stage 4 (severe): Secondary | ICD-10-CM | POA: Diagnosis not present

## 2017-03-24 DIAGNOSIS — N184 Chronic kidney disease, stage 4 (severe): Secondary | ICD-10-CM | POA: Diagnosis not present

## 2017-03-24 DIAGNOSIS — N186 End stage renal disease: Secondary | ICD-10-CM | POA: Diagnosis not present

## 2017-03-24 DIAGNOSIS — D631 Anemia in chronic kidney disease: Secondary | ICD-10-CM | POA: Diagnosis not present

## 2017-03-24 DIAGNOSIS — N2581 Secondary hyperparathyroidism of renal origin: Secondary | ICD-10-CM | POA: Diagnosis not present

## 2017-03-26 DIAGNOSIS — N2581 Secondary hyperparathyroidism of renal origin: Secondary | ICD-10-CM | POA: Diagnosis not present

## 2017-03-26 DIAGNOSIS — D631 Anemia in chronic kidney disease: Secondary | ICD-10-CM | POA: Diagnosis not present

## 2017-03-26 DIAGNOSIS — N184 Chronic kidney disease, stage 4 (severe): Secondary | ICD-10-CM | POA: Diagnosis not present

## 2017-03-26 DIAGNOSIS — N186 End stage renal disease: Secondary | ICD-10-CM | POA: Diagnosis not present

## 2017-03-29 DIAGNOSIS — N186 End stage renal disease: Secondary | ICD-10-CM | POA: Diagnosis not present

## 2017-03-29 DIAGNOSIS — N2581 Secondary hyperparathyroidism of renal origin: Secondary | ICD-10-CM | POA: Diagnosis not present

## 2017-03-29 DIAGNOSIS — D631 Anemia in chronic kidney disease: Secondary | ICD-10-CM | POA: Diagnosis not present

## 2017-03-29 DIAGNOSIS — N184 Chronic kidney disease, stage 4 (severe): Secondary | ICD-10-CM | POA: Diagnosis not present

## 2017-03-31 DIAGNOSIS — N184 Chronic kidney disease, stage 4 (severe): Secondary | ICD-10-CM | POA: Diagnosis not present

## 2017-03-31 DIAGNOSIS — N186 End stage renal disease: Secondary | ICD-10-CM | POA: Diagnosis not present

## 2017-03-31 DIAGNOSIS — N2581 Secondary hyperparathyroidism of renal origin: Secondary | ICD-10-CM | POA: Diagnosis not present

## 2017-03-31 DIAGNOSIS — D631 Anemia in chronic kidney disease: Secondary | ICD-10-CM | POA: Diagnosis not present

## 2017-04-02 DIAGNOSIS — D631 Anemia in chronic kidney disease: Secondary | ICD-10-CM | POA: Diagnosis not present

## 2017-04-02 DIAGNOSIS — N2581 Secondary hyperparathyroidism of renal origin: Secondary | ICD-10-CM | POA: Diagnosis not present

## 2017-04-02 DIAGNOSIS — N186 End stage renal disease: Secondary | ICD-10-CM | POA: Diagnosis not present

## 2017-04-02 DIAGNOSIS — N184 Chronic kidney disease, stage 4 (severe): Secondary | ICD-10-CM | POA: Diagnosis not present

## 2017-04-04 DIAGNOSIS — N186 End stage renal disease: Secondary | ICD-10-CM | POA: Diagnosis not present

## 2017-04-04 DIAGNOSIS — Z992 Dependence on renal dialysis: Secondary | ICD-10-CM | POA: Diagnosis not present

## 2017-04-04 DIAGNOSIS — N2889 Other specified disorders of kidney and ureter: Secondary | ICD-10-CM | POA: Diagnosis not present

## 2017-04-05 DIAGNOSIS — N186 End stage renal disease: Secondary | ICD-10-CM | POA: Diagnosis not present

## 2017-04-05 DIAGNOSIS — N2581 Secondary hyperparathyroidism of renal origin: Secondary | ICD-10-CM | POA: Diagnosis not present

## 2017-04-05 DIAGNOSIS — D631 Anemia in chronic kidney disease: Secondary | ICD-10-CM | POA: Diagnosis not present

## 2017-04-05 DIAGNOSIS — N184 Chronic kidney disease, stage 4 (severe): Secondary | ICD-10-CM | POA: Diagnosis not present

## 2017-04-07 DIAGNOSIS — N186 End stage renal disease: Secondary | ICD-10-CM | POA: Diagnosis not present

## 2017-04-07 DIAGNOSIS — N2581 Secondary hyperparathyroidism of renal origin: Secondary | ICD-10-CM | POA: Diagnosis not present

## 2017-04-07 DIAGNOSIS — D631 Anemia in chronic kidney disease: Secondary | ICD-10-CM | POA: Diagnosis not present

## 2017-04-07 DIAGNOSIS — N184 Chronic kidney disease, stage 4 (severe): Secondary | ICD-10-CM | POA: Diagnosis not present

## 2017-04-09 DIAGNOSIS — D631 Anemia in chronic kidney disease: Secondary | ICD-10-CM | POA: Diagnosis not present

## 2017-04-09 DIAGNOSIS — N2581 Secondary hyperparathyroidism of renal origin: Secondary | ICD-10-CM | POA: Diagnosis not present

## 2017-04-09 DIAGNOSIS — N184 Chronic kidney disease, stage 4 (severe): Secondary | ICD-10-CM | POA: Diagnosis not present

## 2017-04-09 DIAGNOSIS — N186 End stage renal disease: Secondary | ICD-10-CM | POA: Diagnosis not present

## 2017-04-12 DIAGNOSIS — N184 Chronic kidney disease, stage 4 (severe): Secondary | ICD-10-CM | POA: Diagnosis not present

## 2017-04-12 DIAGNOSIS — N2581 Secondary hyperparathyroidism of renal origin: Secondary | ICD-10-CM | POA: Diagnosis not present

## 2017-04-12 DIAGNOSIS — N186 End stage renal disease: Secondary | ICD-10-CM | POA: Diagnosis not present

## 2017-04-12 DIAGNOSIS — D631 Anemia in chronic kidney disease: Secondary | ICD-10-CM | POA: Diagnosis not present

## 2017-04-14 DIAGNOSIS — N2581 Secondary hyperparathyroidism of renal origin: Secondary | ICD-10-CM | POA: Diagnosis not present

## 2017-04-14 DIAGNOSIS — N184 Chronic kidney disease, stage 4 (severe): Secondary | ICD-10-CM | POA: Diagnosis not present

## 2017-04-14 DIAGNOSIS — N186 End stage renal disease: Secondary | ICD-10-CM | POA: Diagnosis not present

## 2017-04-14 DIAGNOSIS — D631 Anemia in chronic kidney disease: Secondary | ICD-10-CM | POA: Diagnosis not present

## 2017-04-15 IMAGING — CT CT ANGIO CHEST
2 of 9 series · 19 of 46 positions shown · IV contrast (omnipaque)
Comparison: None.

CLINICAL DATA: Dyspnea

EXAM:
CT ANGIOGRAPHY CHEST WITH CONTRAST
TECHNIQUE: Multidetector CT imaging of the chest was performed using the
standard protocol during bolus administration of intravenous
contrast. Multiplanar CT image reconstructions and MIPs were
obtained to evaluate the vascular anatomy.
CONTRAST:  65 cc Omnipaque 350

[Series 5: thins · axial · 0.71mm/px · z∈[-500,-208]mm · 16 of 330 slices shown]
[im 19/330  lung]
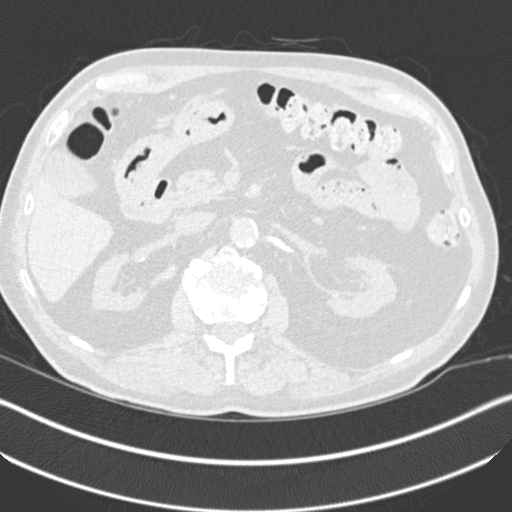
[im 37/330  soft-tissue]
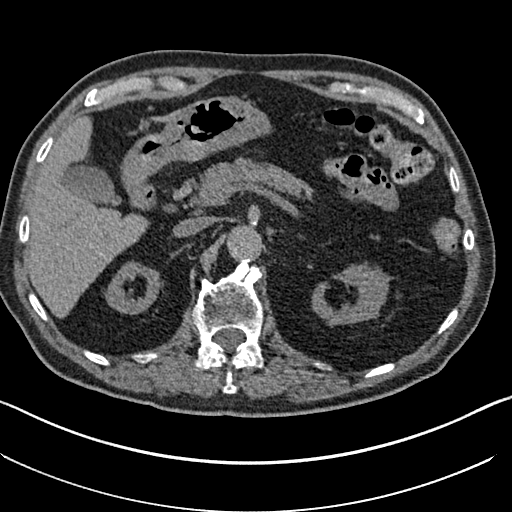
[im 55/330  lung]
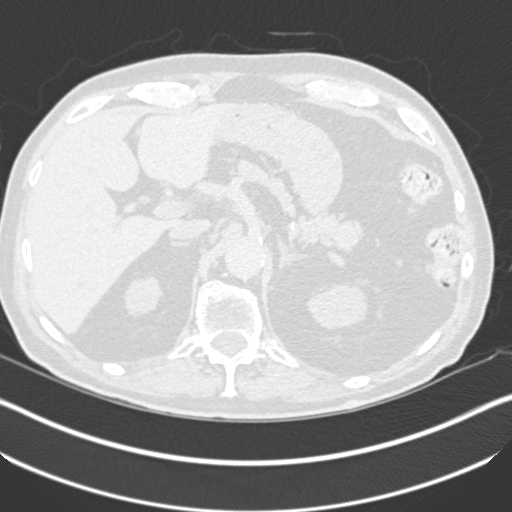
[im 74/330  soft-tissue]
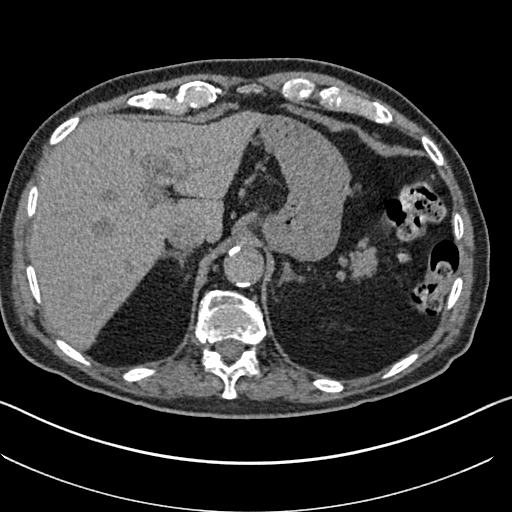
[im 92/330  lung]
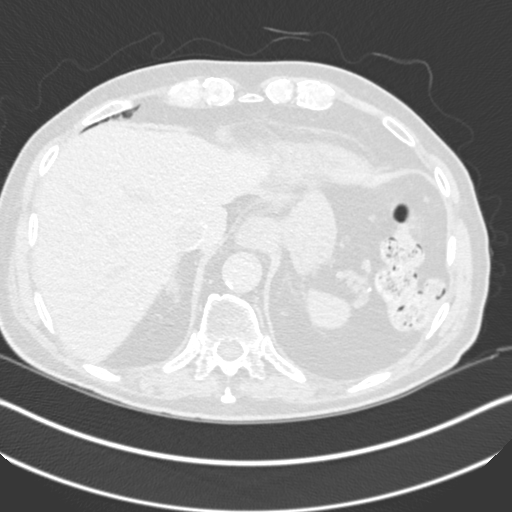
[im 110/330  soft-tissue]
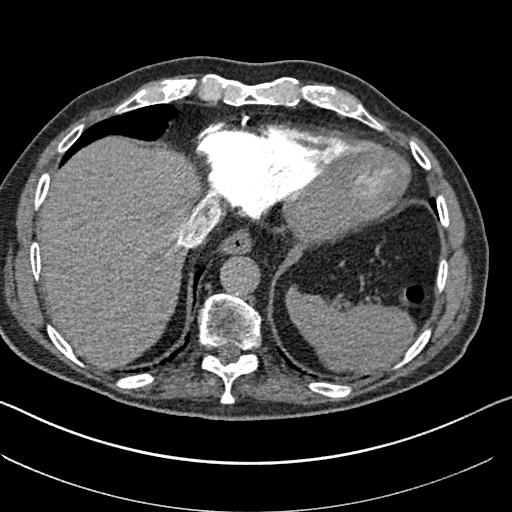
[im 128/330  lung]
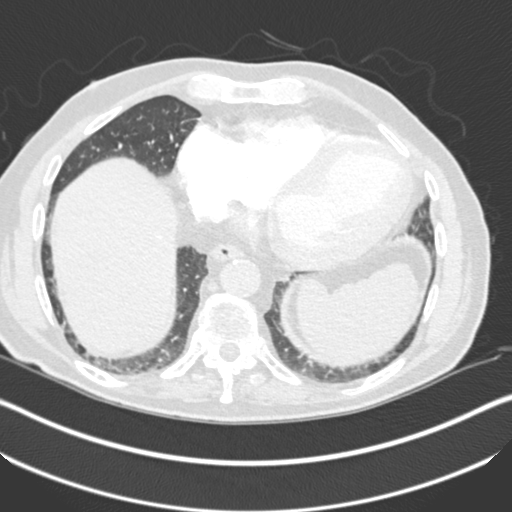
[im 147/330  soft-tissue]
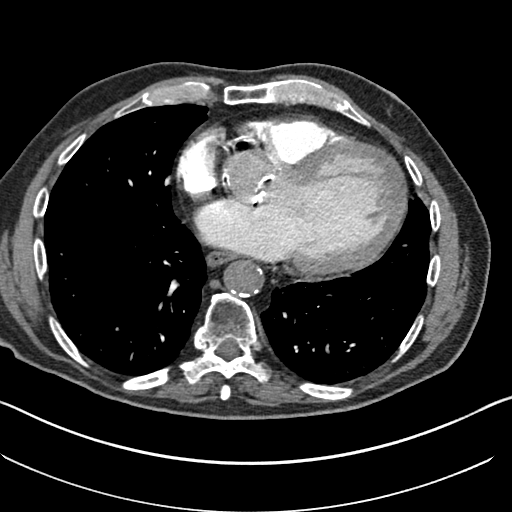
[im 183/330  lung]
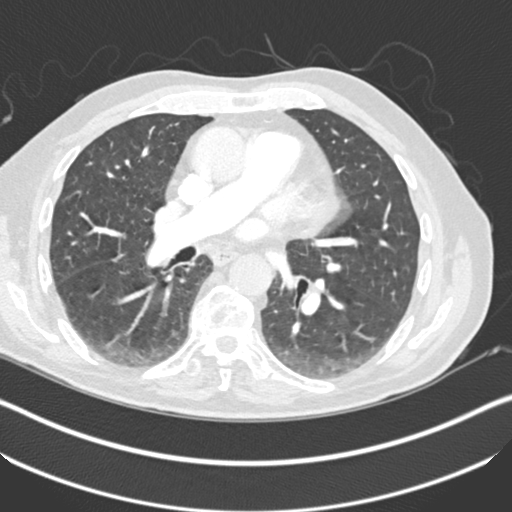
[im 202/330  soft-tissue]
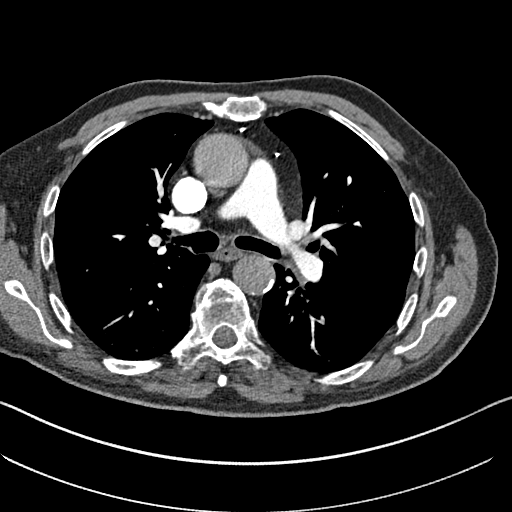
[im 220/330  lung]
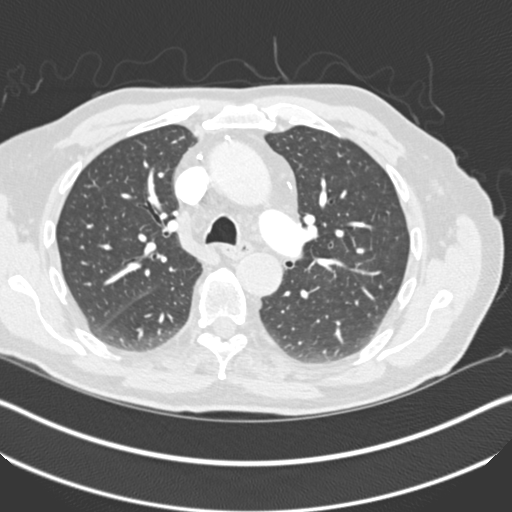
[im 238/330  soft-tissue]
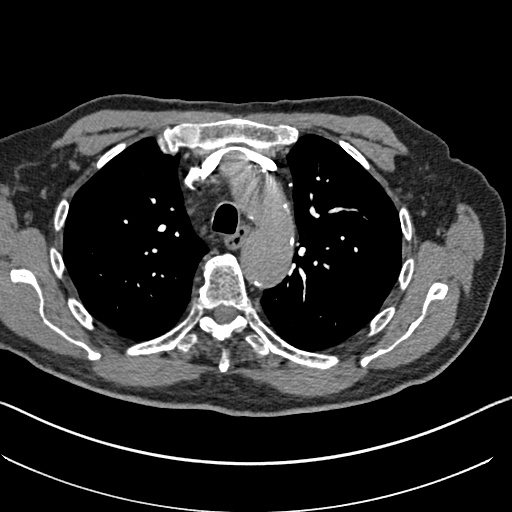
[im 256/330  lung]
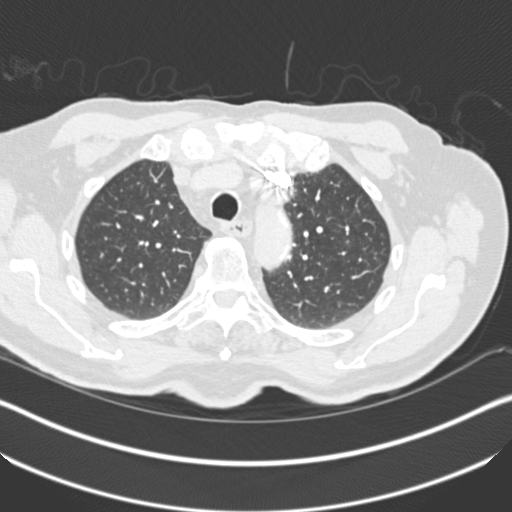
[im 275/330  soft-tissue]
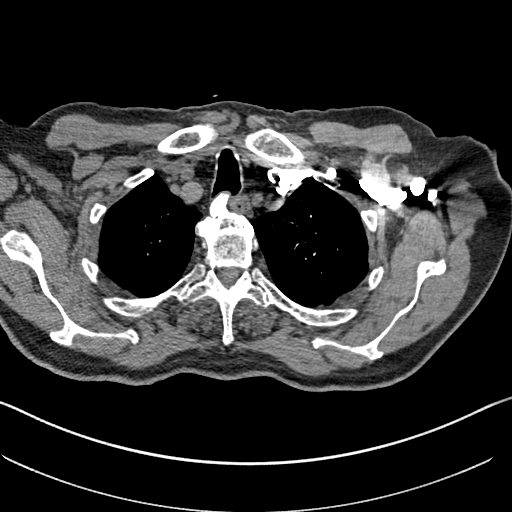
[im 293/330  lung]
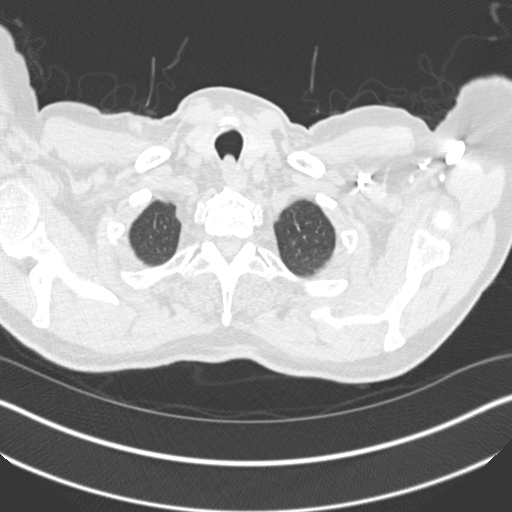
[im 311/330  soft-tissue]
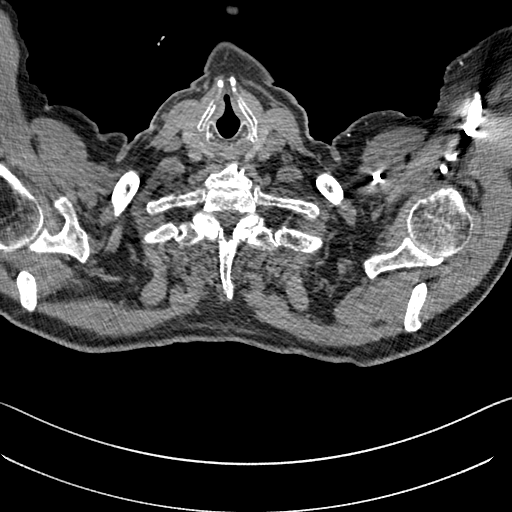

[Series 7: coronal mpr · coronal · 0.69mm/px · 3 of 151 slices shown]
[im 38/151  soft-tissue]
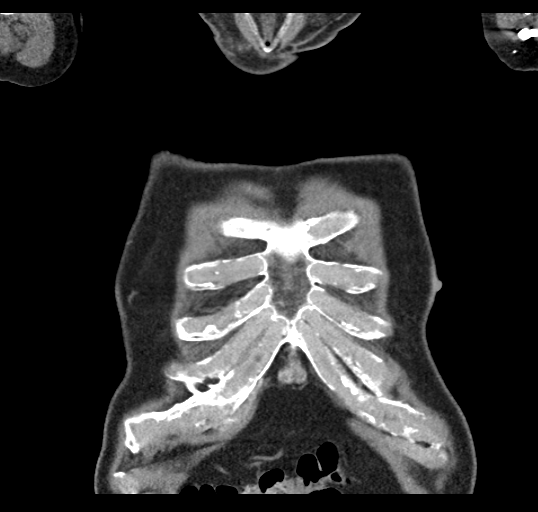
[im 76/151  soft-tissue]
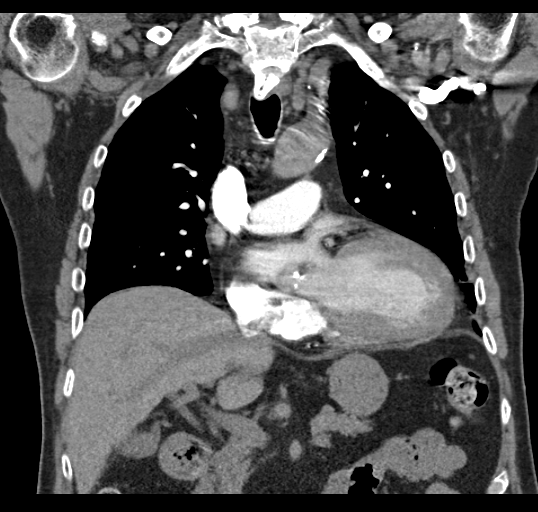
[im 113/151  soft-tissue]
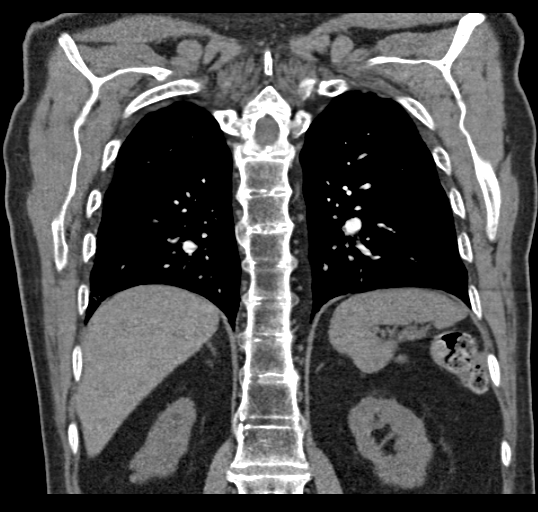

[19 of 46 positions shown; findings below may reference images not displayed]

FINDINGS: There is no evidence of acute pulmonary thromboembolism.

Moderate aortic valvular calcification. Three vessel coronary artery
calcification.

No pneumothorax.  No pleural effusion.

Clear lungs.

No vertebral compression deformity. There are bridging anterior
osteophytes throughout the thoracic spine.

Review of the MIP images confirms the above findings.
IMPRESSION: No evidence of acute pulmonary thromboembolism.

## 2017-04-16 DIAGNOSIS — N184 Chronic kidney disease, stage 4 (severe): Secondary | ICD-10-CM | POA: Diagnosis not present

## 2017-04-16 DIAGNOSIS — N2581 Secondary hyperparathyroidism of renal origin: Secondary | ICD-10-CM | POA: Diagnosis not present

## 2017-04-16 DIAGNOSIS — N186 End stage renal disease: Secondary | ICD-10-CM | POA: Diagnosis not present

## 2017-04-16 DIAGNOSIS — D631 Anemia in chronic kidney disease: Secondary | ICD-10-CM | POA: Diagnosis not present

## 2017-04-19 DIAGNOSIS — N186 End stage renal disease: Secondary | ICD-10-CM | POA: Diagnosis not present

## 2017-04-19 DIAGNOSIS — N184 Chronic kidney disease, stage 4 (severe): Secondary | ICD-10-CM | POA: Diagnosis not present

## 2017-04-19 DIAGNOSIS — D631 Anemia in chronic kidney disease: Secondary | ICD-10-CM | POA: Diagnosis not present

## 2017-04-19 DIAGNOSIS — N2581 Secondary hyperparathyroidism of renal origin: Secondary | ICD-10-CM | POA: Diagnosis not present

## 2017-04-21 DIAGNOSIS — D631 Anemia in chronic kidney disease: Secondary | ICD-10-CM | POA: Diagnosis not present

## 2017-04-21 DIAGNOSIS — N184 Chronic kidney disease, stage 4 (severe): Secondary | ICD-10-CM | POA: Diagnosis not present

## 2017-04-21 DIAGNOSIS — N2581 Secondary hyperparathyroidism of renal origin: Secondary | ICD-10-CM | POA: Diagnosis not present

## 2017-04-21 DIAGNOSIS — N186 End stage renal disease: Secondary | ICD-10-CM | POA: Diagnosis not present

## 2017-04-23 DIAGNOSIS — N186 End stage renal disease: Secondary | ICD-10-CM | POA: Diagnosis not present

## 2017-04-23 DIAGNOSIS — N184 Chronic kidney disease, stage 4 (severe): Secondary | ICD-10-CM | POA: Diagnosis not present

## 2017-04-23 DIAGNOSIS — D631 Anemia in chronic kidney disease: Secondary | ICD-10-CM | POA: Diagnosis not present

## 2017-04-23 DIAGNOSIS — N2581 Secondary hyperparathyroidism of renal origin: Secondary | ICD-10-CM | POA: Diagnosis not present

## 2017-04-26 DIAGNOSIS — N184 Chronic kidney disease, stage 4 (severe): Secondary | ICD-10-CM | POA: Diagnosis not present

## 2017-04-26 DIAGNOSIS — N2581 Secondary hyperparathyroidism of renal origin: Secondary | ICD-10-CM | POA: Diagnosis not present

## 2017-04-26 DIAGNOSIS — N186 End stage renal disease: Secondary | ICD-10-CM | POA: Diagnosis not present

## 2017-04-26 DIAGNOSIS — D631 Anemia in chronic kidney disease: Secondary | ICD-10-CM | POA: Diagnosis not present

## 2017-04-28 DIAGNOSIS — N186 End stage renal disease: Secondary | ICD-10-CM | POA: Diagnosis not present

## 2017-04-28 DIAGNOSIS — N2581 Secondary hyperparathyroidism of renal origin: Secondary | ICD-10-CM | POA: Diagnosis not present

## 2017-04-28 DIAGNOSIS — D631 Anemia in chronic kidney disease: Secondary | ICD-10-CM | POA: Diagnosis not present

## 2017-04-28 DIAGNOSIS — N184 Chronic kidney disease, stage 4 (severe): Secondary | ICD-10-CM | POA: Diagnosis not present

## 2017-04-30 DIAGNOSIS — D631 Anemia in chronic kidney disease: Secondary | ICD-10-CM | POA: Diagnosis not present

## 2017-04-30 DIAGNOSIS — N186 End stage renal disease: Secondary | ICD-10-CM | POA: Diagnosis not present

## 2017-04-30 DIAGNOSIS — N2581 Secondary hyperparathyroidism of renal origin: Secondary | ICD-10-CM | POA: Diagnosis not present

## 2017-04-30 DIAGNOSIS — N184 Chronic kidney disease, stage 4 (severe): Secondary | ICD-10-CM | POA: Diagnosis not present

## 2017-05-02 LAB — CBC AND DIFFERENTIAL: HEMOGLOBIN: 12.9 — AB (ref 13.5–17.5)

## 2017-05-02 LAB — BASIC METABOLIC PANEL: Potassium: 4.9 (ref 3.4–5.3)

## 2017-05-03 DIAGNOSIS — N2581 Secondary hyperparathyroidism of renal origin: Secondary | ICD-10-CM | POA: Diagnosis not present

## 2017-05-03 DIAGNOSIS — N184 Chronic kidney disease, stage 4 (severe): Secondary | ICD-10-CM | POA: Diagnosis not present

## 2017-05-03 DIAGNOSIS — N186 End stage renal disease: Secondary | ICD-10-CM | POA: Diagnosis not present

## 2017-05-03 DIAGNOSIS — D631 Anemia in chronic kidney disease: Secondary | ICD-10-CM | POA: Diagnosis not present

## 2017-05-04 DIAGNOSIS — Z992 Dependence on renal dialysis: Secondary | ICD-10-CM | POA: Diagnosis not present

## 2017-05-04 DIAGNOSIS — N2889 Other specified disorders of kidney and ureter: Secondary | ICD-10-CM | POA: Diagnosis not present

## 2017-05-04 DIAGNOSIS — N186 End stage renal disease: Secondary | ICD-10-CM | POA: Diagnosis not present

## 2017-05-05 DIAGNOSIS — D631 Anemia in chronic kidney disease: Secondary | ICD-10-CM | POA: Diagnosis not present

## 2017-05-05 DIAGNOSIS — N186 End stage renal disease: Secondary | ICD-10-CM | POA: Diagnosis not present

## 2017-05-05 DIAGNOSIS — N184 Chronic kidney disease, stage 4 (severe): Secondary | ICD-10-CM | POA: Diagnosis not present

## 2017-05-05 DIAGNOSIS — N2581 Secondary hyperparathyroidism of renal origin: Secondary | ICD-10-CM | POA: Diagnosis not present

## 2017-05-07 DIAGNOSIS — D631 Anemia in chronic kidney disease: Secondary | ICD-10-CM | POA: Diagnosis not present

## 2017-05-07 DIAGNOSIS — N2581 Secondary hyperparathyroidism of renal origin: Secondary | ICD-10-CM | POA: Diagnosis not present

## 2017-05-07 DIAGNOSIS — N184 Chronic kidney disease, stage 4 (severe): Secondary | ICD-10-CM | POA: Diagnosis not present

## 2017-05-07 DIAGNOSIS — N186 End stage renal disease: Secondary | ICD-10-CM | POA: Diagnosis not present

## 2017-05-10 DIAGNOSIS — D631 Anemia in chronic kidney disease: Secondary | ICD-10-CM | POA: Diagnosis not present

## 2017-05-10 DIAGNOSIS — N184 Chronic kidney disease, stage 4 (severe): Secondary | ICD-10-CM | POA: Diagnosis not present

## 2017-05-10 DIAGNOSIS — N2581 Secondary hyperparathyroidism of renal origin: Secondary | ICD-10-CM | POA: Diagnosis not present

## 2017-05-10 DIAGNOSIS — N186 End stage renal disease: Secondary | ICD-10-CM | POA: Diagnosis not present

## 2017-05-11 ENCOUNTER — Encounter: Payer: Self-pay | Admitting: Internal Medicine

## 2017-05-11 ENCOUNTER — Non-Acute Institutional Stay: Payer: Medicare Other | Admitting: Internal Medicine

## 2017-05-11 VITALS — BP 120/70 | HR 58 | Temp 98.0°F | Ht 69.0 in | Wt 171.0 lb

## 2017-05-11 DIAGNOSIS — I10 Essential (primary) hypertension: Secondary | ICD-10-CM | POA: Diagnosis not present

## 2017-05-11 DIAGNOSIS — N2581 Secondary hyperparathyroidism of renal origin: Secondary | ICD-10-CM | POA: Insufficient documentation

## 2017-05-11 DIAGNOSIS — D649 Anemia, unspecified: Secondary | ICD-10-CM

## 2017-05-11 DIAGNOSIS — I25110 Atherosclerotic heart disease of native coronary artery with unstable angina pectoris: Secondary | ICD-10-CM | POA: Diagnosis not present

## 2017-05-11 DIAGNOSIS — Z23 Encounter for immunization: Secondary | ICD-10-CM

## 2017-05-11 DIAGNOSIS — I770 Arteriovenous fistula, acquired: Secondary | ICD-10-CM | POA: Diagnosis not present

## 2017-05-11 DIAGNOSIS — N186 End stage renal disease: Secondary | ICD-10-CM | POA: Diagnosis not present

## 2017-05-11 DIAGNOSIS — Z992 Dependence on renal dialysis: Secondary | ICD-10-CM | POA: Diagnosis not present

## 2017-05-11 DIAGNOSIS — K2100 Gastro-esophageal reflux disease with esophagitis, without bleeding: Secondary | ICD-10-CM

## 2017-05-11 DIAGNOSIS — M1A3710 Chronic gout due to renal impairment, right ankle and foot, without tophus (tophi): Secondary | ICD-10-CM | POA: Diagnosis not present

## 2017-05-11 MED ORDER — TETANUS-DIPHTH-ACELL PERTUSSIS 5-2-15.5 LF-MCG/0.5 IM SUSP: 0.5000 mL | Freq: Once | INTRAMUSCULAR | 0 refills | Status: AC

## 2017-05-11 NOTE — Progress Notes (Signed)
Location:  Occupational psychologist of Service:  Clinic (12)  Provider: Arva Slaugh L. Mariea Clonts, D.O., C.M.D.  Code Status: FULL CODE Goals of Care:  Advanced Directives 05/11/2017  Does Patient Have a Medical Advance Directive? Yes  Type of Advance Directive [place]  Does patient want to make changes to medical advance directive? No - Patient declined  Copy of Drain in Chart? Yes  Would patient like information on creating a medical advance directive? -     Chief Complaint  Patient presents with  . Medical Management of Chronic Issues    26mth follow-up    HPI: Patient is a 84 y.o. male seen today for medical management of chronic diseases.    Sensipar was increased by nephrology since his last visit.    He does not like dialysis but he knows what the alternative is and what uremia will be like.    He's not hurting.    He is sob when he gets up early in the am in the dark to go to HD.  He was using oxygen there and it helped.  He feels better now since it's been daylight at that time.  He has a feeling he cannot take a deep breath, then.    Unclear if fluid taken off has been changed during that time.  He's lost a couple of lbs.  He was 179 in Feb and 171 lbs today.   He's not cramping.  He does not eat a whole lot.  He eats noticeably less.  Just doesn't have much appetite.  A protein supplement had been suggested, but that is "fine now".    No problems with his GERD with the prilosec.    He does not want to change his gout medicine b/c his toes only occasionally bother him.    BP is great.  At goal.    Nose is often stuffy.  It is worse in the dining room here.  No trouble at night.  He needs to pick up more loratadine.       Past Medical History:  Diagnosis Date  . Anxiety   . Arthritis   . CKD (chronic kidney disease) stage 5, GFR less than 15 ml/min (HCC)   . Complication of anesthesia    " one time my  heart stopped due to a medication that I was on."   . GERD (gastroesophageal reflux disease)   . Gout   . Heart murmur    as a teen  . Hemodialysis patient [facility]    Tues, Thursday, Saturday  . Herpes zoster 04/2012  . Hypertension   . Shortness of breath dyspnea    with exertion  . Unspecified essential hypertension     Past Surgical History:  Procedure Laterality Date  . AORTIC VALVE REPLACEMENT N/A 12/09/2014   Procedure: AORTIC VALVE REPLACEMENT (AVR) WITH 23MM MAGNA EASE BIOPROSTHETIC VALVE;  Surgeon: Gaye Pollack, MD;  Location: [place] OR;  Service: Open Heart Surgery;  Laterality: N/A;  . APPENDECTOMY  1944  . AV FISTULA PLACEMENT Left    Left Cimino AVF placed in [place]   . AV FISTULA PLACEMENT Right 03/06/2014   Procedure: ARTERIOVENOUS (AV) FISTULA CREATION-right radiocephalic;  Surgeon: Mal Misty, MD;  Location: [place];  Service: Vascular;  Laterality: Right;  . AV FISTULA PLACEMENT Right 07/15/2014   Procedure: ARTERIOVENOUS (AV) FISTULA CREATION;  Surgeon: Elam Dutch, MD;  Location: [place];  Service:  Vascular;  Laterality: Right;  . CARDIAC CATHETERIZATION N/A 11/27/2014   Procedure: Right/Left Heart Cath and Coronary Angiography;  Surgeon: Burnell Blanks, MD;  Location: [place] [facility];  Service: Cardiovascular;  Laterality: N/A;  . CATARACT EXTRACTION W/ INTRAOCULAR LENS IMPLANT Bilateral 2014   [facility]  . COLONOSCOPY  2013   Dr. Annamaria Helling [place], [place].  . CORONARY ARTERY BYPASS GRAFT N/A 12/09/2014   Procedure: CORONARY ARTERY BYPASS GRAFTING (CABG), ON PUMP, TIMES THREE, USING LEFT INTERNAL MAMMARY ARTERY, RIGHT GREATER SAPHENOUS VEIN HARVESTED ENDOSCOPICALLY;  Surgeon: Gaye Pollack, MD;  Location: [place];  Service: Open Heart Surgery;  Laterality: N/A;  LIMA-LAD; SVG-OM; SVG-PD  . DIALYSIS FISTULA CREATION  08/04/2012 and 10/13   Dr. Harden Mo  . EYE SURGERY     Cataract  . INSERTION OF DIALYSIS CATHETER Right 01-15-14   Right  chest TDC placed by Dr. Augustin Coupe at [facility]  . LIGATION OF ARTERIOVENOUS  FISTULA Right 07/15/2014   Procedure: LIGATION OF ARTERIOVENOUS  FISTULA  (RIGHT RADIOCEPHALIC);  Surgeon: Elam Dutch, MD;  Location: [place];  Service: Vascular;  Laterality: Right;  . LIGATION OF COMPETING BRANCHES OF ARTERIOVENOUS FISTULA Right 05/08/2014   Procedure: LIGATION OF COMPETING BRANCHES OF RIGHT ARM RADIOCEPHALIC ARTERIOVENOUS FISTULA;  Surgeon: Mal Misty, MD;  Location: [place];  Service: Vascular;  Laterality: Right;  . REVISON OF ARTERIOVENOUS FISTULA Right 07/15/2014   Procedure: EXPLORATION OF ARTERIOVENOUS FISTULA;  Surgeon: Elam Dutch, MD;  Location: [place];  Service: Vascular;  Laterality: Right;  . TEE WITHOUT CARDIOVERSION N/A 12/09/2014   Procedure: TRANSESOPHAGEAL ECHOCARDIOGRAM (TEE);  Surgeon: Gaye Pollack, MD;  Location: [place];  Service: Open Heart Surgery;  Laterality: N/A;    No Known Allergies  Outpatient Encounter Medications as of 05/11/2017  Medication Sig  . allopurinol (ZYLOPRIM) 100 MG tablet TAKE 1 TABLET BY MOUTH DAILY  . amLODipine (NORVASC) 10 MG tablet TAKE 1 TABLET BY MOUTH DAILY TO CONTROL BLOOD PRESSURE  . atorvastatin (LIPITOR) 20 MG tablet TAKE 1 TABLET BY MOUTH DAILY FOR CHOLESTEROL  . calcium carbonate (TUMS - DOSED IN MG ELEMENTAL CALCIUM) 500 MG chewable tablet Chew 2 tablets by mouth 3 (three) times daily with meals.  . cinacalcet (SENSIPAR) 60 MG tablet TAKE 1 TABLET BY MOUTH DAILY WITH THE EVENING MEAL  . loratadine (CLARITIN) 10 MG tablet Take 10 mg daily by mouth.  . metoprolol tartrate (LOPRESSOR) 25 MG tablet TAKE (1/2) TABLET TWICE DAILY.  Marland Kitchen omeprazole (PRILOSEC) 20 MG capsule Take 20 mg by mouth daily.   . [DISCONTINUED] cinacalcet (SENSIPAR) 30 MG tablet Take 30 mg by mouth daily.   No facility-administered encounter medications on file as of 05/11/2017.     Review of Systems:  Review of Systems  Constitutional: Positive for malaise/fatigue.  Negative for chills and fever.  HENT: Positive for hearing loss. Negative for congestion.   Eyes: Negative for blurred vision.       Reading glasses  Respiratory: Positive for shortness of breath.        Was going on for a period of time, but better now  Cardiovascular: Negative for chest pain, palpitations and leg swelling.  Gastrointestinal: Negative for abdominal pain, blood in stool, constipation, heartburn and melena.  Genitourinary: Negative for dysuria.  Musculoskeletal: Negative for back pain, falls and joint pain.  Neurological: Negative for dizziness and loss of consciousness.  Endo/Heme/Allergies: Bruises/bleeds easily.  Psychiatric/Behavioral: Positive for memory loss. Negative for depression. The  patient does not have insomnia.        Having some difficulty keeping track of his meds and appts more since his heart surgery    Health Maintenance  Topic Date Due  . TETANUS/TDAP  06/22/1949  . INFLUENZA VACCINE  08/04/2017  . PNA vac Low Risk Adult  Completed    Physical Exam: Vitals:   05/11/17 0935  BP: 120/70  Pulse: (!) 58  Temp: 98 F (36.7 C)  TempSrc: Oral  SpO2: 96%  Weight: 171 lb (77.6 kg)  Height: 5\' 9"  (1.753 m)   Body mass index is 25.25 kg/m. Physical Exam  Constitutional: He is oriented to person, place, and time. No distress.  HENT:  Head: Normocephalic and atraumatic.  Cardiovascular: Normal rate, regular rhythm and intact distal pulses.  Murmur heard. Pulmonary/Chest: Effort normal and breath sounds normal. No respiratory distress.  Abdominal: Bowel sounds are normal.  Musculoskeletal: Normal range of motion.  Hunched posture, walks w/o assistive device  Neurological: He is alert and oriented to person, place, and time.  Skin: Skin is warm and dry.  Psychiatric: He has a normal mood and affect.    Labs reviewed: Basic Metabolic Panel: No results for input(s): NA, K, CL, CO2, GLUCOSE, BUN, CREATININE, CALCIUM, MG, PHOS, TSH in the last  8760 hours. Liver Function Tests: Recent Labs    02/07/17 1106  AST 10  ALT 9  ALKPHOS 65  BILITOT 0.3  PROT 6.8  ALBUMIN 4.0   No results for input(s): LIPASE, AMYLASE in the last 8760 hours. No results for input(s): AMMONIA in the last 8760 hours. CBC: No results for input(s): WBC, NEUTROABS, HGB, HCT, MCV, PLT in the last 8760 hours. Lipid Panel: Recent Labs    02/07/17 1106  CHOL 125  HDL 37*  LDLCALC 72  TRIG 80  CHOLHDL 3.4   Lab Results  Component Value Date   HGBA1C 5.2 12/06/2014    Assessment/Plan 1. ESRD on dialysis [facility]) -continues, but doesn't like it, but is aware of the alternative -his fluid taken off must have been adjusted around the time change b/c his dyspnea has improved since that time in the mornings and he's lost weight -has secondary hyperparathyroidism and sensipar was increased since we had last seen his pill bottles   2. Coronary artery disease involving native coronary artery of native heart with unstable angina pectoris ([place]) -no recent symptoms -since about 8 mos after his CABG, he's not been able to walk around the campus anymore like he could b/c of fatigue -appetite down so encouraged intake and suspect that's playing a role  3. A-V fistula (HCC) -still in place and in use at HD  4. Essential hypertension -bp well controlled with current regimen  5. Normocytic anemia -last hbg 12.9 on HD labs from April which is great, hectoral shot was decreased  6. 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 Gastroesophageal reflux disease with esophagitis -cont prilosec therapy  8. Need for Tdap vaccination - Tdap (ADACEL) 05-05-13.5 LF-MCG/0.5 injection; Inject 0.5 mLs into the muscle once for 1 dose.  Dispense: 0.5 mL; Refill: 0  Labs/tests ordered:  No new, pt  brings labs from HD Next appt:  6 mos med mgt  Camyah Pultz L. Ada [place], D.O. [facility] [address], [place] [address] Cell Phone (Mon-Fri 8am-5pm):  [phone] On Call:  [phone] & follow prompts after 5pm & weekends Office Phone:  [phone] Office Fax:  [phone]

## 2017-05-12 DIAGNOSIS — N2581 Secondary hyperparathyroidism of renal origin: Secondary | ICD-10-CM | POA: Diagnosis not present

## 2017-05-12 DIAGNOSIS — D631 Anemia in chronic kidney disease: Secondary | ICD-10-CM | POA: Diagnosis not present

## 2017-05-12 DIAGNOSIS — N184 Chronic kidney disease, stage 4 (severe): Secondary | ICD-10-CM | POA: Diagnosis not present

## 2017-05-12 DIAGNOSIS — N186 End stage renal disease: Secondary | ICD-10-CM | POA: Diagnosis not present

## 2017-05-14 DIAGNOSIS — D631 Anemia in chronic kidney disease: Secondary | ICD-10-CM | POA: Diagnosis not present

## 2017-05-14 DIAGNOSIS — N186 End stage renal disease: Secondary | ICD-10-CM | POA: Diagnosis not present

## 2017-05-14 DIAGNOSIS — N184 Chronic kidney disease, stage 4 (severe): Secondary | ICD-10-CM | POA: Diagnosis not present

## 2017-05-14 DIAGNOSIS — N2581 Secondary hyperparathyroidism of renal origin: Secondary | ICD-10-CM | POA: Diagnosis not present

## 2017-05-17 DIAGNOSIS — N184 Chronic kidney disease, stage 4 (severe): Secondary | ICD-10-CM | POA: Diagnosis not present

## 2017-05-17 DIAGNOSIS — D631 Anemia in chronic kidney disease: Secondary | ICD-10-CM | POA: Diagnosis not present

## 2017-05-17 DIAGNOSIS — N186 End stage renal disease: Secondary | ICD-10-CM | POA: Diagnosis not present

## 2017-05-17 DIAGNOSIS — N2581 Secondary hyperparathyroidism of renal origin: Secondary | ICD-10-CM | POA: Diagnosis not present

## 2017-05-18 DIAGNOSIS — N186 End stage renal disease: Secondary | ICD-10-CM | POA: Diagnosis not present

## 2017-05-18 DIAGNOSIS — I871 Compression of vein: Secondary | ICD-10-CM | POA: Diagnosis not present

## 2017-05-18 DIAGNOSIS — Z992 Dependence on renal dialysis: Secondary | ICD-10-CM | POA: Diagnosis not present

## 2017-05-18 DIAGNOSIS — T82858A Stenosis of vascular prosthetic devices, implants and grafts, initial encounter: Secondary | ICD-10-CM | POA: Diagnosis not present

## 2017-05-19 DIAGNOSIS — N186 End stage renal disease: Secondary | ICD-10-CM | POA: Diagnosis not present

## 2017-05-19 DIAGNOSIS — D631 Anemia in chronic kidney disease: Secondary | ICD-10-CM | POA: Diagnosis not present

## 2017-05-19 DIAGNOSIS — N184 Chronic kidney disease, stage 4 (severe): Secondary | ICD-10-CM | POA: Diagnosis not present

## 2017-05-19 DIAGNOSIS — N2581 Secondary hyperparathyroidism of renal origin: Secondary | ICD-10-CM | POA: Diagnosis not present

## 2017-05-21 DIAGNOSIS — N184 Chronic kidney disease, stage 4 (severe): Secondary | ICD-10-CM | POA: Diagnosis not present

## 2017-05-21 DIAGNOSIS — N186 End stage renal disease: Secondary | ICD-10-CM | POA: Diagnosis not present

## 2017-05-21 DIAGNOSIS — N2581 Secondary hyperparathyroidism of renal origin: Secondary | ICD-10-CM | POA: Diagnosis not present

## 2017-05-21 DIAGNOSIS — D631 Anemia in chronic kidney disease: Secondary | ICD-10-CM | POA: Diagnosis not present

## 2017-05-23 ENCOUNTER — Telehealth: Payer: Self-pay | Admitting: Internal Medicine

## 2017-05-23 NOTE — Telephone Encounter (Signed)
I left a message asking the pt to call me at [phone] to schedule AWV with Clarise Cruz for both him and his wife at [place] on 06/21/17 if available. VDM (DD)

## 2017-05-24 DIAGNOSIS — D631 Anemia in chronic kidney disease: Secondary | ICD-10-CM | POA: Diagnosis not present

## 2017-05-24 DIAGNOSIS — N184 Chronic kidney disease, stage 4 (severe): Secondary | ICD-10-CM | POA: Diagnosis not present

## 2017-05-24 DIAGNOSIS — N186 End stage renal disease: Secondary | ICD-10-CM | POA: Diagnosis not present

## 2017-05-24 DIAGNOSIS — N2581 Secondary hyperparathyroidism of renal origin: Secondary | ICD-10-CM | POA: Diagnosis not present

## 2017-05-26 DIAGNOSIS — D631 Anemia in chronic kidney disease: Secondary | ICD-10-CM | POA: Diagnosis not present

## 2017-05-26 DIAGNOSIS — N2581 Secondary hyperparathyroidism of renal origin: Secondary | ICD-10-CM | POA: Diagnosis not present

## 2017-05-26 DIAGNOSIS — N184 Chronic kidney disease, stage 4 (severe): Secondary | ICD-10-CM | POA: Diagnosis not present

## 2017-05-26 DIAGNOSIS — N186 End stage renal disease: Secondary | ICD-10-CM | POA: Diagnosis not present

## 2017-05-28 DIAGNOSIS — N2581 Secondary hyperparathyroidism of renal origin: Secondary | ICD-10-CM | POA: Diagnosis not present

## 2017-05-28 DIAGNOSIS — N186 End stage renal disease: Secondary | ICD-10-CM | POA: Diagnosis not present

## 2017-05-28 DIAGNOSIS — D631 Anemia in chronic kidney disease: Secondary | ICD-10-CM | POA: Diagnosis not present

## 2017-05-28 DIAGNOSIS — N184 Chronic kidney disease, stage 4 (severe): Secondary | ICD-10-CM | POA: Diagnosis not present

## 2017-05-31 DIAGNOSIS — N186 End stage renal disease: Secondary | ICD-10-CM | POA: Diagnosis not present

## 2017-05-31 DIAGNOSIS — N184 Chronic kidney disease, stage 4 (severe): Secondary | ICD-10-CM | POA: Diagnosis not present

## 2017-05-31 DIAGNOSIS — N2581 Secondary hyperparathyroidism of renal origin: Secondary | ICD-10-CM | POA: Diagnosis not present

## 2017-05-31 DIAGNOSIS — D631 Anemia in chronic kidney disease: Secondary | ICD-10-CM | POA: Diagnosis not present

## 2017-06-02 DIAGNOSIS — N186 End stage renal disease: Secondary | ICD-10-CM | POA: Diagnosis not present

## 2017-06-02 DIAGNOSIS — D631 Anemia in chronic kidney disease: Secondary | ICD-10-CM | POA: Diagnosis not present

## 2017-06-02 DIAGNOSIS — N2581 Secondary hyperparathyroidism of renal origin: Secondary | ICD-10-CM | POA: Diagnosis not present

## 2017-06-02 DIAGNOSIS — N184 Chronic kidney disease, stage 4 (severe): Secondary | ICD-10-CM | POA: Diagnosis not present

## 2017-06-04 DIAGNOSIS — N2581 Secondary hyperparathyroidism of renal origin: Secondary | ICD-10-CM | POA: Diagnosis not present

## 2017-06-04 DIAGNOSIS — Z992 Dependence on renal dialysis: Secondary | ICD-10-CM | POA: Diagnosis not present

## 2017-06-04 DIAGNOSIS — N186 End stage renal disease: Secondary | ICD-10-CM | POA: Diagnosis not present

## 2017-06-04 DIAGNOSIS — N184 Chronic kidney disease, stage 4 (severe): Secondary | ICD-10-CM | POA: Diagnosis not present

## 2017-06-04 DIAGNOSIS — D631 Anemia in chronic kidney disease: Secondary | ICD-10-CM | POA: Diagnosis not present

## 2017-06-04 DIAGNOSIS — N2889 Other specified disorders of kidney and ureter: Secondary | ICD-10-CM | POA: Diagnosis not present

## 2017-06-07 DIAGNOSIS — N186 End stage renal disease: Secondary | ICD-10-CM | POA: Diagnosis not present

## 2017-06-07 DIAGNOSIS — N2581 Secondary hyperparathyroidism of renal origin: Secondary | ICD-10-CM | POA: Diagnosis not present

## 2017-06-07 DIAGNOSIS — N184 Chronic kidney disease, stage 4 (severe): Secondary | ICD-10-CM | POA: Diagnosis not present

## 2017-06-07 DIAGNOSIS — D631 Anemia in chronic kidney disease: Secondary | ICD-10-CM | POA: Diagnosis not present

## 2017-06-08 ENCOUNTER — Other Ambulatory Visit: Payer: Self-pay | Admitting: Internal Medicine

## 2017-06-09 DIAGNOSIS — N184 Chronic kidney disease, stage 4 (severe): Secondary | ICD-10-CM | POA: Diagnosis not present

## 2017-06-09 DIAGNOSIS — D631 Anemia in chronic kidney disease: Secondary | ICD-10-CM | POA: Diagnosis not present

## 2017-06-09 DIAGNOSIS — N186 End stage renal disease: Secondary | ICD-10-CM | POA: Diagnosis not present

## 2017-06-09 DIAGNOSIS — N2581 Secondary hyperparathyroidism of renal origin: Secondary | ICD-10-CM | POA: Diagnosis not present

## 2017-06-11 DIAGNOSIS — N2581 Secondary hyperparathyroidism of renal origin: Secondary | ICD-10-CM | POA: Diagnosis not present

## 2017-06-11 DIAGNOSIS — D631 Anemia in chronic kidney disease: Secondary | ICD-10-CM | POA: Diagnosis not present

## 2017-06-11 DIAGNOSIS — N186 End stage renal disease: Secondary | ICD-10-CM | POA: Diagnosis not present

## 2017-06-11 DIAGNOSIS — N184 Chronic kidney disease, stage 4 (severe): Secondary | ICD-10-CM | POA: Diagnosis not present

## 2017-06-14 DIAGNOSIS — D631 Anemia in chronic kidney disease: Secondary | ICD-10-CM | POA: Diagnosis not present

## 2017-06-14 DIAGNOSIS — N2581 Secondary hyperparathyroidism of renal origin: Secondary | ICD-10-CM | POA: Diagnosis not present

## 2017-06-14 DIAGNOSIS — N184 Chronic kidney disease, stage 4 (severe): Secondary | ICD-10-CM | POA: Diagnosis not present

## 2017-06-14 DIAGNOSIS — N186 End stage renal disease: Secondary | ICD-10-CM | POA: Diagnosis not present

## 2017-06-15 ENCOUNTER — Non-Acute Institutional Stay: Payer: Medicare Other

## 2017-06-15 VITALS — BP 122/66 | HR 56 | Temp 98.0°F | Ht 69.0 in | Wt 174.0 lb

## 2017-06-15 DIAGNOSIS — Z0000 Encounter for general adult medical examination without abnormal findings: Secondary | ICD-10-CM | POA: Diagnosis not present

## 2017-06-15 MED ORDER — ZOSTER VAC RECOMB ADJUVANTED 50 MCG/0.5ML IM SUSR: 0.5000 mL | Freq: Once | INTRAMUSCULAR | 1 refills | Status: AC

## 2017-06-15 NOTE — Progress Notes (Addendum)
Subjective:   Eric [name] is a 84 y.o. male who presents for Medicare Annual/Subsequent preventive examination at [facility]  Last AWV-03/24/2016    Objective:    Vitals: BP 122/66 (BP Location: Left Arm, Patient Position: Sitting)   Pulse (!) 56   Temp 98 F (36.7 C) (Oral)   Ht 5' 9"  (1.753 m)   Wt 174 lb (78.9 kg)   SpO2 96%   BMI 25.70 kg/m   Body mass index is 25.7 kg/m.  Advanced Directives 06/15/2017 05/11/2017 05/12/2016 04/30/2015 12/09/2014 12/06/2014 11/20/2014  Does Patient Have a Medical Advance Directive? Yes Yes Yes Yes Yes Yes Yes  Type of [place] administrator Power of [place];Living will [place];Living will [place]  Does patient want to make changes to medical advance directive? No - Patient declined No - Patient declined - - No - Patient declined No - Patient declined -  Copy of [place] in Chart? Yes Yes Yes Yes Yes Yes Yes  Would patient like information on creating a medical advance directive? - - - - - - -    Tobacco Social History   Tobacco Use  Smoking Status Never Smoker  Smokeless Tobacco Never Used     Counseling given: Not Answered   Clinical Intake:  Pre-visit preparation completed: No  Pain : No/denies pain     Nutritional Risks: None Diabetes: No  How often do you need to have someone help you when you read instructions, pamphlets, or other written materials from your doctor or pharmacy?: 1 - Never What is the last grade level you completed in school?: PHD  Interpreter Needed?: No  Information entered by :: Tyson Dense, RN  Past Medical History:  Diagnosis Date  . Anxiety   . Arthritis   . CKD (chronic kidney disease) stage 5, GFR less than 15 ml/min (HCC)   . Complication of anesthesia    " one time my heart stopped  due to a medication that I was on."   . GERD (gastroesophageal reflux disease)   . Gout   . Heart murmur    as a teen  . Hemodialysis patient [facility])    Tues, Thursday, Saturday  . Herpes zoster 04/2012  . Hypertension   . Shortness of breath dyspnea    with exertion  . Unspecified essential hypertension    Past Surgical History:  Procedure Laterality Date  . AORTIC VALVE REPLACEMENT N/A 12/09/2014   Procedure: AORTIC VALVE REPLACEMENT (AVR) WITH 23MM MAGNA EASE BIOPROSTHETIC VALVE;  Surgeon: Gaye Pollack, MD;  Location: [place] OR;  Service: Open Heart Surgery;  Laterality: N/A;  . APPENDECTOMY  1944  . AV FISTULA PLACEMENT Left    Left C[place] AVF placed in [place]   . AV FISTULA PLACEMENT Right 03/06/2014   Procedure: ARTERIOVENOUS (AV) FISTULA CREATION-right radiocephalic;  Surgeon: Mal Misty, MD;  Location: [facility] OR;  Service: Vascular;  Laterality: Right;  . AV FISTULA PLACEMENT Right 07/15/2014   Procedure: ARTERIOVENOUS (AV) FISTULA CREATION;  Surgeon: Elam Dutch, MD;  Location: [place];  Service: Vascular;  Laterality: Right;  . CARDIAC CATHETERIZATION N/A 11/27/2014   Procedure: Right/Left Heart Cath and Coronary Angiography;  Surgeon: Burnell Blanks, MD;  Location: [place] [facility] LAB;  Service: Cardiovascular;  Laterality: N/A;  . CATARACT EXTRACTION W/ INTRAOCULAR LENS IMPLANT Bilateral 2014  [place]  . COLONOSCOPY  2013   Dr. Annamaria Helling [place], [place].  . CORONARY ARTERY BYPASS GRAFT N/A 12/09/2014   Procedure: CORONARY ARTERY BYPASS GRAFTING (CABG), ON PUMP, TIMES THREE, USING LEFT INTERNAL MAMMARY ARTERY, RIGHT GREATER SAPHENOUS VEIN HARVESTED ENDOSCOPICALLY;  Surgeon: Gaye Pollack, MD;  Location: [place];  Service: Open Heart Surgery;  Laterality: N/A;  LIMA-LAD; SVG-OM; SVG-PD  . DIALYSIS FISTULA CREATION  08/04/2012 and 10/13   Dr. Harden Mo  . EYE SURGERY     Cataract  . INSERTION OF DIALYSIS CATHETER Right 01-15-14   Right chest TDC placed  by Dr. Augustin Coupe at [facility]  . LIGATION OF ARTERIOVENOUS  FISTULA Right 07/15/2014   Procedure: LIGATION OF ARTERIOVENOUS  FISTULA  (RIGHT RADIOCEPHALIC);  Surgeon: Elam Dutch, MD;  Location: [place];  Service: Vascular;  Laterality: Right;  . LIGATION OF COMPETING BRANCHES OF ARTERIOVENOUS FISTULA Right 05/08/2014   Procedure: LIGATION OF COMPETING BRANCHES OF RIGHT ARM RADIOCEPHALIC ARTERIOVENOUS FISTULA;  Surgeon: Mal Misty, MD;  Location: [place];  Service: Vascular;  Laterality: Right;  . REVISON OF ARTERIOVENOUS FISTULA Right 07/15/2014   Procedure: EXPLORATION OF ARTERIOVENOUS FISTULA;  Surgeon: Elam Dutch, MD;  Location: [place];  Service: Vascular;  Laterality: Right;  . TEE WITHOUT CARDIOVERSION N/A 12/09/2014   Procedure: TRANSESOPHAGEAL ECHOCARDIOGRAM (TEE);  Surgeon: Gaye Pollack, MD;  Location: [place];  Service: Open Heart Surgery;  Laterality: N/A;   Family History  Problem Relation Age of Onset  . Diabetes Father   . Lung cancer Father 62       non-smoker  . Cancer Mother        multiple myeloma  . Colon cancer Neg Hx    Social History   Socioeconomic History  . Marital status: Married    Spouse name: Jan  . Number of children: 3  . Years of education: 8  . Highest education level: Not on file  Occupational History  . Occupation: retired Manufacturing engineer  . Financial resource strain: Not hard at all  . Food insecurity:    Worry: Never true    Inability: Never true  . Transportation needs:    Medical: No    Non-medical: No  Tobacco Use  . Smoking status: Never Smoker  . Smokeless tobacco: Never Used  Substance and Sexual Activity  . Alcohol use: Yes    Alcohol/week: 1.2 oz    Types: 1 Glasses of wine, 1 Shots of liquor per week    Comment: one glass a day of either wine or liquor  . Drug use: No  . Sexual activity: Not on file  Lifestyle  . Physical activity:    Days per week: 3 days    Minutes per session: 40 min  . Stress: Only a  little  Relationships  . Social connections:    Talks on phone: More than three times a week    Gets together: More than three times a week    Attends religious service: Never    Active member of club or organization: No    Attends meetings of clubs or organizations: Never    Relationship status: Married  Other Topics Concern  . Not on file  Social History Narrative   Patient is Married since 1958. Occupation: Licensed conveyancer   Lives in apartment,  Independent Living  section at [place] since 05/04/2013. Also has a home in [place], [place].   No Smoking history  Alcohol history: 5 drinks/ week   Regular exercise: 3-4 times a week , treadmill   Patient has Advanced planning documents: Living Will, HCPOA          Outpatient Encounter Medications as of 06/15/2017  Medication Sig  . allopurinol (ZYLOPRIM) 100 MG tablet TAKE 1 TABLET BY MOUTH DAILY  . amLODipine (NORVASC) 10 MG tablet TAKE 1 TABLET BY MOUTH DAILY TO CONTROL BLOOD PRESSURE  . atorvastatin (LIPITOR) 20 MG tablet TAKE 1 TABLET BY MOUTH DAILY FOR CHOLESTEROL  . calcium carbonate (TUMS - DOSED IN MG ELEMENTAL CALCIUM) 500 MG chewable tablet Chew 2 tablets by mouth 3 (three) times daily with meals.  . cinacalcet (SENSIPAR) 60 MG tablet TAKE 1 TABLET BY MOUTH DAILY WITH THE EVENING MEAL  . loratadine (CLARITIN) 10 MG tablet Take 10 mg daily by mouth.  . metoprolol tartrate (LOPRESSOR) 25 MG tablet TAKE (1/2) TABLET TWICE DAILY.  Marland Kitchen omeprazole (PRILOSEC) 20 MG capsule Take 20 mg by mouth daily.   Marland Kitchen Zoster Vaccine Adjuvanted [facility]) injection Inject 0.5 mLs into the muscle once for 1 dose.  . [DISCONTINUED] Zoster Vaccine Adjuvanted [facility]) injection Inject 0.5 mLs into the muscle once.   No facility-administered encounter medications on file as of 06/15/2017.     Activities of Daily Living In your present state of health, do you have any difficulty performing the following activities: 06/15/2017    Hearing? N  Vision? N  Difficulty concentrating or making decisions? Y  Walking or climbing stairs? N  Dressing or bathing? N  Doing errands, shopping? N  Preparing Food and eating ? N  Using the Toilet? N  In the past six months, have you accidently leaked urine? N  Do you have problems with loss of bowel control? N  Managing your Medications? N  Managing your Finances? N  Housekeeping or managing your Housekeeping? N  Some recent data might be hidden    Patient Care Team: Gayland Curry, DO as PCP - General (Geriatric Medicine) Jamal Maes, MD as Consulting Physician (Nephrology)   Assessment:   This is a routine wellness examination for [employer].  Exercise Activities and Dietary recommendations    Goals    None      Fall Risk Fall Risk  06/15/2017 05/11/2017 11/10/2016 05/12/2016 04/30/2015  Falls in the past year? No No No No No   Is the patient's home free of loose throw rugs in walkways, pet beds, electrical cords, etc?   yes      Grab bars in the bathroom? yes      Handrails on the stairs?   yes      Adequate lighting?   yes   Depression Screen PHQ 2/9 Scores 06/15/2017 05/11/2017 11/10/2016 05/12/2016  PHQ - 2 Score 0 0 0 0    Cognitive Function MMSE - Mini Mental State Exam 06/15/2017 05/12/2016 04/30/2015  Orientation to time 5 5 5   Orientation to Place 5 5 5   Registration 3 3 3   Attention/ Calculation 5 5 5   Recall 1 3 3   Language- name 2 objects 2 2 2   Language- repeat 1 1 1   Language- follow 3 step command 3 3 3   Language- read & follow direction 1 1 1   Write a sentence 1 1 1   Copy design 1 1 1   Total score 28 30 30         Immunization History  Administered Date(s) Administered  . Influenza-Unspecified 10/04/2012, 10/05/2015, 10/04/2016  . Pneumococcal Conjugate-13 04/30/2015  . Pneumococcal  Polysaccharide-23 06/04/2008  . Tdap 06/01/2017  . Zoster 01/05/2011    Qualifies for Shingles Vaccine? Yes, educated and ordered  Screening  Tests Health Maintenance  Topic Date Due  . INFLUENZA VACCINE  08/04/2017  . TETANUS/TDAP  06/02/2027  . PNA vac Low Risk Adult  Completed   Cancer Screenings: Lung: Low Dose CT Chest recommended if Age 40-80 years, 30 pack-year currently smoking OR have quit w/in 15years. Patient does not qualify. Colorectal: up to date  Additional Screenings:  Hepatitis C Screening:declined      Plan:    I have personally reviewed and addressed the Medicare Annual Wellness questionnaire and have noted the following in the patient's chart:  A. Medical and social history B. Use of alcohol, tobacco or illicit drugs  C. Current medications and supplements D. Functional ability and status E.  Nutritional status F.  Physical activity G. Advance directives H. List of other physicians I.  Hospitalizations, surgeries, and ER visits in previous 12 months J.  [place] to include hearing, vision, cognitive, depression L. Referrals and appointments - none  In addition, I have reviewed and discussed with patient certain preventive protocols, quality metrics, and best practice recommendations. A written personalized care plan for preventive services as well as general preventive health recommendations were provided to patient.  See attached scanned questionnaire for additional information.   Signed,   Tyson Dense, RN Nurse Health Advisor  Patient Concerns: None

## 2017-06-15 NOTE — Patient Instructions (Addendum)
Eric [name] , Thank you for taking time to come for your Medicare Wellness Visit. I appreciate your ongoing commitment to your health goals. Please review the following plan we discussed and let me know if I can assist you in the future.   Screening recommendations/referrals: Colonoscopy excluded, over age 82 Recommended yearly ophthalmology/optometry visit for glaucoma screening and checkup Recommended yearly dental visit for hygiene and checkup  Vaccinations: Influenza vaccine up to date, due 2019 fall season Pneumococcal vaccine up to date, completed Tdap vaccine up to date, due 06/02/2027 Shingles vaccine due, ordered to pharamcy    Advanced directives: In chart  Conditions/risks identified: none  Next appointment: Dr. Mariea Clonts 11/16/2017 @ 9:30am  Preventive Care 65 Years and Older, Male Preventive care refers to lifestyle choices and visits with your health care provider that can promote health and wellness. What does preventive care include?  A yearly physical exam. This is also called an annual well check.  Dental exams once or twice a year.  Routine eye exams. Ask your health care provider how often you should have your eyes checked.  Personal lifestyle choices, including:  Daily care of your teeth and gums.  Regular physical activity.  Eating a healthy diet.  Avoiding tobacco and drug use.  Limiting alcohol use.  Practicing safe sex.  Taking low doses of aspirin every day.  Taking vitamin and mineral supplements as recommended by your health care provider. What happens during an annual well check? The services and screenings done by your health care provider during your annual well check will depend on your age, overall health, lifestyle risk factors, and family history of disease. Counseling  Your health care provider may ask you questions about your:  Alcohol use.  Tobacco use.  Drug use.  Emotional well-being.  Home and relationship  well-being.  Sexual activity.  Eating habits.  History of falls.  Memory and ability to understand (cognition).  Work and work Statistician. Screening  You may have the following tests or measurements:  Height, weight, and BMI.  Blood pressure.  Lipid and cholesterol levels. These may be checked every 5 years, or more frequently if you are over 44 years old.  Skin check.  Lung cancer screening. You may have this screening every year starting at age 63 if you have a 30-pack-year history of smoking and currently smoke or have quit within the past 15 years.  Fecal occult blood test (FOBT) of the stool. You may have this test every year starting at age 53.  Flexible sigmoidoscopy or colonoscopy. You may have a sigmoidoscopy every 5 years or a colonoscopy every 10 years starting at age 52.  Prostate cancer screening. Recommendations will vary depending on your family history and other risks.  Hepatitis C blood test.  Hepatitis B blood test.  Sexually transmitted disease (STD) testing.  Diabetes screening. This is done by checking your blood sugar (glucose) after you have not eaten for a while (fasting). You may have this done every 1-3 years.  Abdominal aortic aneurysm (AAA) screening. You may need this if you are a current or former smoker.  Osteoporosis. You may be screened starting at age 54 if you are at high risk. Talk with your health care provider about your test results, treatment options, and if necessary, the need for more tests. Vaccines  Your health care provider may recommend certain vaccines, such as:  Influenza vaccine. This is recommended every year.  Tetanus, diphtheria, and acellular pertussis (Tdap, Td) vaccine. You may need  a Td booster every 10 years.  Zoster vaccine. You may need this after age 34.  Pneumococcal 13-valent conjugate (PCV13) vaccine. One dose is recommended after age 27.  Pneumococcal polysaccharide (PPSV23) vaccine. One dose is  recommended after age 56. Talk to your health care provider about which screenings and vaccines you need and how often you need them. This information is not intended to replace advice given to you by your health care provider. Make sure you discuss any questions you have with your health care provider. Document Released: 01/17/2015 Document Revised: 09/10/2015 Document Reviewed: 10/22/2014 Elsevier Interactive Patient Education  2017 [place] Prevention in the Home Falls can cause injuries. They can happen to people of all ages. There are many things you can do to make your home safe and to help prevent falls. What can I do on the outside of my home?  Regularly fix the edges of walkways and driveways and fix any cracks.  Remove anything that might make you trip as you walk through a door, such as a raised step or threshold.  Trim any bushes or trees on the path to your home.  Use bright outdoor lighting.  Clear any walking paths of anything that might make someone trip, such as rocks or tools.  Regularly check to see if handrails are loose or broken. Make sure that both sides of any steps have handrails.  Any raised decks and porches should have guardrails on the edges.  Have any leaves, snow, or ice cleared regularly.  Use sand or salt on walking paths during winter.  Clean up any spills in your garage right away. This includes oil or grease spills. What can I do in the bathroom?  Use night lights.  Install grab bars by the toilet and in the tub and shower. Do not use towel bars as grab bars.  Use non-skid mats or decals in the tub or shower.  If you need to sit down in the shower, use a plastic, non-slip stool.  Keep the floor dry. Clean up any water that spills on the floor as soon as it happens.  Remove soap buildup in the tub or shower regularly.  Attach bath mats securely with double-sided non-slip rug tape.  Do not have throw rugs and other things on  the floor that can make you trip. What can I do in the bedroom?  Use night lights.  Make sure that you have a light by your bed that is easy to reach.  Do not use any sheets or blankets that are too big for your bed. They should not hang down onto the floor.  Have a firm chair that has side arms. You can use this for support while you get dressed.  Do not have throw rugs and other things on the floor that can make you trip. What can I do in the kitchen?  Clean up any spills right away.  Avoid walking on wet floors.  Keep items that you use a lot in easy-to-reach places.  If you need to reach something above you, use a strong step stool that has a grab bar.  Keep electrical cords out of the way.  Do not use floor polish or wax that makes floors slippery. If you must use wax, use non-skid floor wax.  Do not have throw rugs and other things on the floor that can make you trip. What can I do with my stairs?  Do not leave any items on the  stairs.  Make sure that there are handrails on both sides of the stairs and use them. Fix handrails that are broken or loose. Make sure that handrails are as long as the stairways.  Check any carpeting to make sure that it is firmly attached to the stairs. Fix any carpet that is loose or worn.  Avoid having throw rugs at the top or bottom of the stairs. If you do have throw rugs, attach them to the floor with carpet tape.  Make sure that you have a light switch at the top of the stairs and the bottom of the stairs. If you do not have them, ask someone to add them for you. What else can I do to help prevent falls?  Wear shoes that:  Do not have high heels.  Have rubber bottoms.  Are comfortable and fit you well.  Are closed at the toe. Do not wear sandals.  If you use a stepladder:  Make sure that it is fully opened. Do not climb a closed stepladder.  Make sure that both sides of the stepladder are locked into place.  Ask someone to  hold it for you, if possible.  Clearly mark and make sure that you can see:  Any grab bars or handrails.  First and last steps.  Where the edge of each step is.  Use tools that help you move around (mobility aids) if they are needed. These include:  Canes.  Walkers.  Scooters.  Crutches.  Turn on the lights when you go into a dark area. Replace any light bulbs as soon as they burn out.  Set up your furniture so you have a clear path. Avoid moving your furniture around.  If any of your floors are uneven, fix them.  If there are any pets around you, be aware of where they are.  Review your medicines with your doctor. Some medicines can make you feel dizzy. This can increase your chance of falling. Ask your doctor what other things that you can do to help prevent falls. This information is not intended to replace advice given to you by your health care provider. Make sure you discuss any questions you have with your health care provider. Document Released: 10/17/2008 Document Revised: 05/29/2015 Document Reviewed: 01/25/2014 Elsevier Interactive Patient Education  2017 [employer].

## 2017-06-16 DIAGNOSIS — N186 End stage renal disease: Secondary | ICD-10-CM | POA: Diagnosis not present

## 2017-06-16 DIAGNOSIS — N184 Chronic kidney disease, stage 4 (severe): Secondary | ICD-10-CM | POA: Diagnosis not present

## 2017-06-16 DIAGNOSIS — D631 Anemia in chronic kidney disease: Secondary | ICD-10-CM | POA: Diagnosis not present

## 2017-06-16 DIAGNOSIS — N2581 Secondary hyperparathyroidism of renal origin: Secondary | ICD-10-CM | POA: Diagnosis not present

## 2017-06-18 DIAGNOSIS — N184 Chronic kidney disease, stage 4 (severe): Secondary | ICD-10-CM | POA: Diagnosis not present

## 2017-06-18 DIAGNOSIS — N2581 Secondary hyperparathyroidism of renal origin: Secondary | ICD-10-CM | POA: Diagnosis not present

## 2017-06-18 DIAGNOSIS — D631 Anemia in chronic kidney disease: Secondary | ICD-10-CM | POA: Diagnosis not present

## 2017-06-18 DIAGNOSIS — N186 End stage renal disease: Secondary | ICD-10-CM | POA: Diagnosis not present

## 2017-06-21 DIAGNOSIS — N184 Chronic kidney disease, stage 4 (severe): Secondary | ICD-10-CM | POA: Diagnosis not present

## 2017-06-21 DIAGNOSIS — D631 Anemia in chronic kidney disease: Secondary | ICD-10-CM | POA: Diagnosis not present

## 2017-06-21 DIAGNOSIS — N2581 Secondary hyperparathyroidism of renal origin: Secondary | ICD-10-CM | POA: Diagnosis not present

## 2017-06-21 DIAGNOSIS — N186 End stage renal disease: Secondary | ICD-10-CM | POA: Diagnosis not present

## 2017-06-23 DIAGNOSIS — N186 End stage renal disease: Secondary | ICD-10-CM | POA: Diagnosis not present

## 2017-06-23 DIAGNOSIS — N184 Chronic kidney disease, stage 4 (severe): Secondary | ICD-10-CM | POA: Diagnosis not present

## 2017-06-23 DIAGNOSIS — D631 Anemia in chronic kidney disease: Secondary | ICD-10-CM | POA: Diagnosis not present

## 2017-06-23 DIAGNOSIS — N2581 Secondary hyperparathyroidism of renal origin: Secondary | ICD-10-CM | POA: Diagnosis not present

## 2017-06-25 DIAGNOSIS — N186 End stage renal disease: Secondary | ICD-10-CM | POA: Diagnosis not present

## 2017-06-25 DIAGNOSIS — N184 Chronic kidney disease, stage 4 (severe): Secondary | ICD-10-CM | POA: Diagnosis not present

## 2017-06-25 DIAGNOSIS — N2581 Secondary hyperparathyroidism of renal origin: Secondary | ICD-10-CM | POA: Diagnosis not present

## 2017-06-25 DIAGNOSIS — D631 Anemia in chronic kidney disease: Secondary | ICD-10-CM | POA: Diagnosis not present

## 2017-06-28 DIAGNOSIS — N186 End stage renal disease: Secondary | ICD-10-CM | POA: Diagnosis not present

## 2017-06-28 DIAGNOSIS — N2581 Secondary hyperparathyroidism of renal origin: Secondary | ICD-10-CM | POA: Diagnosis not present

## 2017-06-28 DIAGNOSIS — D631 Anemia in chronic kidney disease: Secondary | ICD-10-CM | POA: Diagnosis not present

## 2017-06-28 DIAGNOSIS — N184 Chronic kidney disease, stage 4 (severe): Secondary | ICD-10-CM | POA: Diagnosis not present

## 2017-06-30 DIAGNOSIS — D631 Anemia in chronic kidney disease: Secondary | ICD-10-CM | POA: Diagnosis not present

## 2017-06-30 DIAGNOSIS — N184 Chronic kidney disease, stage 4 (severe): Secondary | ICD-10-CM | POA: Diagnosis not present

## 2017-06-30 DIAGNOSIS — N186 End stage renal disease: Secondary | ICD-10-CM | POA: Diagnosis not present

## 2017-06-30 DIAGNOSIS — N2581 Secondary hyperparathyroidism of renal origin: Secondary | ICD-10-CM | POA: Diagnosis not present

## 2017-07-02 DIAGNOSIS — N2581 Secondary hyperparathyroidism of renal origin: Secondary | ICD-10-CM | POA: Diagnosis not present

## 2017-07-02 DIAGNOSIS — D631 Anemia in chronic kidney disease: Secondary | ICD-10-CM | POA: Diagnosis not present

## 2017-07-02 DIAGNOSIS — N184 Chronic kidney disease, stage 4 (severe): Secondary | ICD-10-CM | POA: Diagnosis not present

## 2017-07-02 DIAGNOSIS — N186 End stage renal disease: Secondary | ICD-10-CM | POA: Diagnosis not present

## 2017-07-04 DIAGNOSIS — N2889 Other specified disorders of kidney and ureter: Secondary | ICD-10-CM | POA: Diagnosis not present

## 2017-07-04 DIAGNOSIS — N186 End stage renal disease: Secondary | ICD-10-CM | POA: Diagnosis not present

## 2017-07-04 DIAGNOSIS — Z992 Dependence on renal dialysis: Secondary | ICD-10-CM | POA: Diagnosis not present

## 2017-07-05 DIAGNOSIS — N186 End stage renal disease: Secondary | ICD-10-CM | POA: Diagnosis not present

## 2017-07-05 DIAGNOSIS — D631 Anemia in chronic kidney disease: Secondary | ICD-10-CM | POA: Diagnosis not present

## 2017-07-05 DIAGNOSIS — N2581 Secondary hyperparathyroidism of renal origin: Secondary | ICD-10-CM | POA: Diagnosis not present

## 2017-07-05 DIAGNOSIS — N184 Chronic kidney disease, stage 4 (severe): Secondary | ICD-10-CM | POA: Diagnosis not present

## 2017-07-07 DIAGNOSIS — N186 End stage renal disease: Secondary | ICD-10-CM | POA: Diagnosis not present

## 2017-07-07 DIAGNOSIS — N2581 Secondary hyperparathyroidism of renal origin: Secondary | ICD-10-CM | POA: Diagnosis not present

## 2017-07-07 DIAGNOSIS — N184 Chronic kidney disease, stage 4 (severe): Secondary | ICD-10-CM | POA: Diagnosis not present

## 2017-07-07 DIAGNOSIS — D631 Anemia in chronic kidney disease: Secondary | ICD-10-CM | POA: Diagnosis not present

## 2017-07-09 DIAGNOSIS — N2581 Secondary hyperparathyroidism of renal origin: Secondary | ICD-10-CM | POA: Diagnosis not present

## 2017-07-09 DIAGNOSIS — D631 Anemia in chronic kidney disease: Secondary | ICD-10-CM | POA: Diagnosis not present

## 2017-07-09 DIAGNOSIS — N186 End stage renal disease: Secondary | ICD-10-CM | POA: Diagnosis not present

## 2017-07-09 DIAGNOSIS — N184 Chronic kidney disease, stage 4 (severe): Secondary | ICD-10-CM | POA: Diagnosis not present

## 2017-07-12 DIAGNOSIS — N186 End stage renal disease: Secondary | ICD-10-CM | POA: Diagnosis not present

## 2017-07-12 DIAGNOSIS — D631 Anemia in chronic kidney disease: Secondary | ICD-10-CM | POA: Diagnosis not present

## 2017-07-12 DIAGNOSIS — N2581 Secondary hyperparathyroidism of renal origin: Secondary | ICD-10-CM | POA: Diagnosis not present

## 2017-07-12 DIAGNOSIS — N184 Chronic kidney disease, stage 4 (severe): Secondary | ICD-10-CM | POA: Diagnosis not present

## 2017-07-14 DIAGNOSIS — N186 End stage renal disease: Secondary | ICD-10-CM | POA: Diagnosis not present

## 2017-07-14 DIAGNOSIS — D631 Anemia in chronic kidney disease: Secondary | ICD-10-CM | POA: Diagnosis not present

## 2017-07-14 DIAGNOSIS — N2581 Secondary hyperparathyroidism of renal origin: Secondary | ICD-10-CM | POA: Diagnosis not present

## 2017-07-14 DIAGNOSIS — N184 Chronic kidney disease, stage 4 (severe): Secondary | ICD-10-CM | POA: Diagnosis not present

## 2017-07-16 DIAGNOSIS — N186 End stage renal disease: Secondary | ICD-10-CM | POA: Diagnosis not present

## 2017-07-16 DIAGNOSIS — N184 Chronic kidney disease, stage 4 (severe): Secondary | ICD-10-CM | POA: Diagnosis not present

## 2017-07-16 DIAGNOSIS — N2581 Secondary hyperparathyroidism of renal origin: Secondary | ICD-10-CM | POA: Diagnosis not present

## 2017-07-16 DIAGNOSIS — D631 Anemia in chronic kidney disease: Secondary | ICD-10-CM | POA: Diagnosis not present

## 2017-07-19 DIAGNOSIS — N2581 Secondary hyperparathyroidism of renal origin: Secondary | ICD-10-CM | POA: Diagnosis not present

## 2017-07-19 DIAGNOSIS — N184 Chronic kidney disease, stage 4 (severe): Secondary | ICD-10-CM | POA: Diagnosis not present

## 2017-07-19 DIAGNOSIS — N186 End stage renal disease: Secondary | ICD-10-CM | POA: Diagnosis not present

## 2017-07-19 DIAGNOSIS — D631 Anemia in chronic kidney disease: Secondary | ICD-10-CM | POA: Diagnosis not present

## 2017-07-21 DIAGNOSIS — N184 Chronic kidney disease, stage 4 (severe): Secondary | ICD-10-CM | POA: Diagnosis not present

## 2017-07-21 DIAGNOSIS — D631 Anemia in chronic kidney disease: Secondary | ICD-10-CM | POA: Diagnosis not present

## 2017-07-21 DIAGNOSIS — N2581 Secondary hyperparathyroidism of renal origin: Secondary | ICD-10-CM | POA: Diagnosis not present

## 2017-07-21 DIAGNOSIS — N186 End stage renal disease: Secondary | ICD-10-CM | POA: Diagnosis not present

## 2017-07-22 DIAGNOSIS — Z992 Dependence on renal dialysis: Secondary | ICD-10-CM | POA: Diagnosis not present

## 2017-07-22 DIAGNOSIS — N186 End stage renal disease: Secondary | ICD-10-CM | POA: Diagnosis not present

## 2017-07-22 DIAGNOSIS — I871 Compression of vein: Secondary | ICD-10-CM | POA: Diagnosis not present

## 2017-07-22 DIAGNOSIS — T82858A Stenosis of vascular prosthetic devices, implants and grafts, initial encounter: Secondary | ICD-10-CM | POA: Diagnosis not present

## 2017-07-23 DIAGNOSIS — D631 Anemia in chronic kidney disease: Secondary | ICD-10-CM | POA: Diagnosis not present

## 2017-07-23 DIAGNOSIS — N186 End stage renal disease: Secondary | ICD-10-CM | POA: Diagnosis not present

## 2017-07-23 DIAGNOSIS — N184 Chronic kidney disease, stage 4 (severe): Secondary | ICD-10-CM | POA: Diagnosis not present

## 2017-07-23 DIAGNOSIS — N2581 Secondary hyperparathyroidism of renal origin: Secondary | ICD-10-CM | POA: Diagnosis not present

## 2017-07-26 DIAGNOSIS — N184 Chronic kidney disease, stage 4 (severe): Secondary | ICD-10-CM | POA: Diagnosis not present

## 2017-07-26 DIAGNOSIS — N2581 Secondary hyperparathyroidism of renal origin: Secondary | ICD-10-CM | POA: Diagnosis not present

## 2017-07-26 DIAGNOSIS — N186 End stage renal disease: Secondary | ICD-10-CM | POA: Diagnosis not present

## 2017-07-26 DIAGNOSIS — D631 Anemia in chronic kidney disease: Secondary | ICD-10-CM | POA: Diagnosis not present

## 2017-07-28 DIAGNOSIS — N184 Chronic kidney disease, stage 4 (severe): Secondary | ICD-10-CM | POA: Diagnosis not present

## 2017-07-28 DIAGNOSIS — N2581 Secondary hyperparathyroidism of renal origin: Secondary | ICD-10-CM | POA: Diagnosis not present

## 2017-07-28 DIAGNOSIS — N186 End stage renal disease: Secondary | ICD-10-CM | POA: Diagnosis not present

## 2017-07-28 DIAGNOSIS — D631 Anemia in chronic kidney disease: Secondary | ICD-10-CM | POA: Diagnosis not present

## 2017-07-30 DIAGNOSIS — N186 End stage renal disease: Secondary | ICD-10-CM | POA: Diagnosis not present

## 2017-07-30 DIAGNOSIS — N184 Chronic kidney disease, stage 4 (severe): Secondary | ICD-10-CM | POA: Diagnosis not present

## 2017-07-30 DIAGNOSIS — D631 Anemia in chronic kidney disease: Secondary | ICD-10-CM | POA: Diagnosis not present

## 2017-07-30 DIAGNOSIS — N2581 Secondary hyperparathyroidism of renal origin: Secondary | ICD-10-CM | POA: Diagnosis not present

## 2017-08-02 DIAGNOSIS — N186 End stage renal disease: Secondary | ICD-10-CM | POA: Diagnosis not present

## 2017-08-02 DIAGNOSIS — D631 Anemia in chronic kidney disease: Secondary | ICD-10-CM | POA: Diagnosis not present

## 2017-08-02 DIAGNOSIS — N2581 Secondary hyperparathyroidism of renal origin: Secondary | ICD-10-CM | POA: Diagnosis not present

## 2017-08-02 DIAGNOSIS — N184 Chronic kidney disease, stage 4 (severe): Secondary | ICD-10-CM | POA: Diagnosis not present

## 2017-08-04 DIAGNOSIS — N2581 Secondary hyperparathyroidism of renal origin: Secondary | ICD-10-CM | POA: Diagnosis not present

## 2017-08-04 DIAGNOSIS — D631 Anemia in chronic kidney disease: Secondary | ICD-10-CM | POA: Diagnosis not present

## 2017-08-04 DIAGNOSIS — N2889 Other specified disorders of kidney and ureter: Secondary | ICD-10-CM | POA: Diagnosis not present

## 2017-08-04 DIAGNOSIS — Z992 Dependence on renal dialysis: Secondary | ICD-10-CM | POA: Diagnosis not present

## 2017-08-04 DIAGNOSIS — N184 Chronic kidney disease, stage 4 (severe): Secondary | ICD-10-CM | POA: Diagnosis not present

## 2017-08-04 DIAGNOSIS — N186 End stage renal disease: Secondary | ICD-10-CM | POA: Diagnosis not present

## 2017-08-04 DIAGNOSIS — D509 Iron deficiency anemia, unspecified: Secondary | ICD-10-CM | POA: Diagnosis not present

## 2017-08-06 DIAGNOSIS — D509 Iron deficiency anemia, unspecified: Secondary | ICD-10-CM | POA: Diagnosis not present

## 2017-08-06 DIAGNOSIS — N186 End stage renal disease: Secondary | ICD-10-CM | POA: Diagnosis not present

## 2017-08-06 DIAGNOSIS — N2581 Secondary hyperparathyroidism of renal origin: Secondary | ICD-10-CM | POA: Diagnosis not present

## 2017-08-06 DIAGNOSIS — D631 Anemia in chronic kidney disease: Secondary | ICD-10-CM | POA: Diagnosis not present

## 2017-08-06 DIAGNOSIS — N184 Chronic kidney disease, stage 4 (severe): Secondary | ICD-10-CM | POA: Diagnosis not present

## 2017-08-09 DIAGNOSIS — D631 Anemia in chronic kidney disease: Secondary | ICD-10-CM | POA: Diagnosis not present

## 2017-08-09 DIAGNOSIS — N184 Chronic kidney disease, stage 4 (severe): Secondary | ICD-10-CM | POA: Diagnosis not present

## 2017-08-09 DIAGNOSIS — D509 Iron deficiency anemia, unspecified: Secondary | ICD-10-CM | POA: Diagnosis not present

## 2017-08-09 DIAGNOSIS — N2581 Secondary hyperparathyroidism of renal origin: Secondary | ICD-10-CM | POA: Diagnosis not present

## 2017-08-09 DIAGNOSIS — N186 End stage renal disease: Secondary | ICD-10-CM | POA: Diagnosis not present

## 2017-08-11 DIAGNOSIS — N184 Chronic kidney disease, stage 4 (severe): Secondary | ICD-10-CM | POA: Diagnosis not present

## 2017-08-11 DIAGNOSIS — N186 End stage renal disease: Secondary | ICD-10-CM | POA: Diagnosis not present

## 2017-08-11 DIAGNOSIS — N2581 Secondary hyperparathyroidism of renal origin: Secondary | ICD-10-CM | POA: Diagnosis not present

## 2017-08-11 DIAGNOSIS — D509 Iron deficiency anemia, unspecified: Secondary | ICD-10-CM | POA: Diagnosis not present

## 2017-08-11 DIAGNOSIS — D631 Anemia in chronic kidney disease: Secondary | ICD-10-CM | POA: Diagnosis not present

## 2017-08-13 DIAGNOSIS — D631 Anemia in chronic kidney disease: Secondary | ICD-10-CM | POA: Diagnosis not present

## 2017-08-13 DIAGNOSIS — N186 End stage renal disease: Secondary | ICD-10-CM | POA: Diagnosis not present

## 2017-08-13 DIAGNOSIS — N2581 Secondary hyperparathyroidism of renal origin: Secondary | ICD-10-CM | POA: Diagnosis not present

## 2017-08-13 DIAGNOSIS — N184 Chronic kidney disease, stage 4 (severe): Secondary | ICD-10-CM | POA: Diagnosis not present

## 2017-08-13 DIAGNOSIS — D509 Iron deficiency anemia, unspecified: Secondary | ICD-10-CM | POA: Diagnosis not present

## 2017-08-15 LAB — BASIC METABOLIC PANEL: Potassium: 5.2 (ref 3.4–5.3)

## 2017-08-15 LAB — CBC AND DIFFERENTIAL: Hemoglobin: 12.5 — AB (ref 13.5–17.5)

## 2017-08-16 DIAGNOSIS — N2581 Secondary hyperparathyroidism of renal origin: Secondary | ICD-10-CM | POA: Diagnosis not present

## 2017-08-16 DIAGNOSIS — D631 Anemia in chronic kidney disease: Secondary | ICD-10-CM | POA: Diagnosis not present

## 2017-08-16 DIAGNOSIS — N186 End stage renal disease: Secondary | ICD-10-CM | POA: Diagnosis not present

## 2017-08-16 DIAGNOSIS — D509 Iron deficiency anemia, unspecified: Secondary | ICD-10-CM | POA: Diagnosis not present

## 2017-08-16 DIAGNOSIS — N184 Chronic kidney disease, stage 4 (severe): Secondary | ICD-10-CM | POA: Diagnosis not present

## 2017-08-18 DIAGNOSIS — N186 End stage renal disease: Secondary | ICD-10-CM | POA: Diagnosis not present

## 2017-08-18 DIAGNOSIS — D631 Anemia in chronic kidney disease: Secondary | ICD-10-CM | POA: Diagnosis not present

## 2017-08-18 DIAGNOSIS — D509 Iron deficiency anemia, unspecified: Secondary | ICD-10-CM | POA: Diagnosis not present

## 2017-08-18 DIAGNOSIS — N184 Chronic kidney disease, stage 4 (severe): Secondary | ICD-10-CM | POA: Diagnosis not present

## 2017-08-18 DIAGNOSIS — N2581 Secondary hyperparathyroidism of renal origin: Secondary | ICD-10-CM | POA: Diagnosis not present

## 2017-08-20 DIAGNOSIS — D509 Iron deficiency anemia, unspecified: Secondary | ICD-10-CM | POA: Diagnosis not present

## 2017-08-20 DIAGNOSIS — N2581 Secondary hyperparathyroidism of renal origin: Secondary | ICD-10-CM | POA: Diagnosis not present

## 2017-08-20 DIAGNOSIS — N184 Chronic kidney disease, stage 4 (severe): Secondary | ICD-10-CM | POA: Diagnosis not present

## 2017-08-20 DIAGNOSIS — D631 Anemia in chronic kidney disease: Secondary | ICD-10-CM | POA: Diagnosis not present

## 2017-08-20 DIAGNOSIS — N186 End stage renal disease: Secondary | ICD-10-CM | POA: Diagnosis not present

## 2017-08-23 DIAGNOSIS — N184 Chronic kidney disease, stage 4 (severe): Secondary | ICD-10-CM | POA: Diagnosis not present

## 2017-08-23 DIAGNOSIS — D509 Iron deficiency anemia, unspecified: Secondary | ICD-10-CM | POA: Diagnosis not present

## 2017-08-23 DIAGNOSIS — D631 Anemia in chronic kidney disease: Secondary | ICD-10-CM | POA: Diagnosis not present

## 2017-08-23 DIAGNOSIS — N2581 Secondary hyperparathyroidism of renal origin: Secondary | ICD-10-CM | POA: Diagnosis not present

## 2017-08-23 DIAGNOSIS — N186 End stage renal disease: Secondary | ICD-10-CM | POA: Diagnosis not present

## 2017-08-25 DIAGNOSIS — N186 End stage renal disease: Secondary | ICD-10-CM | POA: Diagnosis not present

## 2017-08-25 DIAGNOSIS — N184 Chronic kidney disease, stage 4 (severe): Secondary | ICD-10-CM | POA: Diagnosis not present

## 2017-08-25 DIAGNOSIS — D631 Anemia in chronic kidney disease: Secondary | ICD-10-CM | POA: Diagnosis not present

## 2017-08-25 DIAGNOSIS — N2581 Secondary hyperparathyroidism of renal origin: Secondary | ICD-10-CM | POA: Diagnosis not present

## 2017-08-25 DIAGNOSIS — D509 Iron deficiency anemia, unspecified: Secondary | ICD-10-CM | POA: Diagnosis not present

## 2017-08-27 DIAGNOSIS — N2581 Secondary hyperparathyroidism of renal origin: Secondary | ICD-10-CM | POA: Diagnosis not present

## 2017-08-27 DIAGNOSIS — D631 Anemia in chronic kidney disease: Secondary | ICD-10-CM | POA: Diagnosis not present

## 2017-08-27 DIAGNOSIS — N186 End stage renal disease: Secondary | ICD-10-CM | POA: Diagnosis not present

## 2017-08-27 DIAGNOSIS — D509 Iron deficiency anemia, unspecified: Secondary | ICD-10-CM | POA: Diagnosis not present

## 2017-08-27 DIAGNOSIS — N184 Chronic kidney disease, stage 4 (severe): Secondary | ICD-10-CM | POA: Diagnosis not present

## 2017-08-30 DIAGNOSIS — N2581 Secondary hyperparathyroidism of renal origin: Secondary | ICD-10-CM | POA: Diagnosis not present

## 2017-08-30 DIAGNOSIS — D509 Iron deficiency anemia, unspecified: Secondary | ICD-10-CM | POA: Diagnosis not present

## 2017-08-30 DIAGNOSIS — N184 Chronic kidney disease, stage 4 (severe): Secondary | ICD-10-CM | POA: Diagnosis not present

## 2017-08-30 DIAGNOSIS — D631 Anemia in chronic kidney disease: Secondary | ICD-10-CM | POA: Diagnosis not present

## 2017-08-30 DIAGNOSIS — N186 End stage renal disease: Secondary | ICD-10-CM | POA: Diagnosis not present

## 2017-09-01 DIAGNOSIS — N2581 Secondary hyperparathyroidism of renal origin: Secondary | ICD-10-CM | POA: Diagnosis not present

## 2017-09-01 DIAGNOSIS — N184 Chronic kidney disease, stage 4 (severe): Secondary | ICD-10-CM | POA: Diagnosis not present

## 2017-09-01 DIAGNOSIS — N186 End stage renal disease: Secondary | ICD-10-CM | POA: Diagnosis not present

## 2017-09-01 DIAGNOSIS — D631 Anemia in chronic kidney disease: Secondary | ICD-10-CM | POA: Diagnosis not present

## 2017-09-01 DIAGNOSIS — D509 Iron deficiency anemia, unspecified: Secondary | ICD-10-CM | POA: Diagnosis not present

## 2017-09-03 DIAGNOSIS — D631 Anemia in chronic kidney disease: Secondary | ICD-10-CM | POA: Diagnosis not present

## 2017-09-03 DIAGNOSIS — D509 Iron deficiency anemia, unspecified: Secondary | ICD-10-CM | POA: Diagnosis not present

## 2017-09-03 DIAGNOSIS — N184 Chronic kidney disease, stage 4 (severe): Secondary | ICD-10-CM | POA: Diagnosis not present

## 2017-09-03 DIAGNOSIS — N2581 Secondary hyperparathyroidism of renal origin: Secondary | ICD-10-CM | POA: Diagnosis not present

## 2017-09-03 DIAGNOSIS — N186 End stage renal disease: Secondary | ICD-10-CM | POA: Diagnosis not present

## 2017-09-04 DIAGNOSIS — Z992 Dependence on renal dialysis: Secondary | ICD-10-CM | POA: Diagnosis not present

## 2017-09-04 DIAGNOSIS — N186 End stage renal disease: Secondary | ICD-10-CM | POA: Diagnosis not present

## 2017-09-04 DIAGNOSIS — N2889 Other specified disorders of kidney and ureter: Secondary | ICD-10-CM | POA: Diagnosis not present

## 2017-09-06 DIAGNOSIS — N2581 Secondary hyperparathyroidism of renal origin: Secondary | ICD-10-CM | POA: Diagnosis not present

## 2017-09-06 DIAGNOSIS — Z23 Encounter for immunization: Secondary | ICD-10-CM | POA: Diagnosis not present

## 2017-09-06 DIAGNOSIS — D631 Anemia in chronic kidney disease: Secondary | ICD-10-CM | POA: Diagnosis not present

## 2017-09-06 DIAGNOSIS — N184 Chronic kidney disease, stage 4 (severe): Secondary | ICD-10-CM | POA: Diagnosis not present

## 2017-09-06 DIAGNOSIS — D509 Iron deficiency anemia, unspecified: Secondary | ICD-10-CM | POA: Diagnosis not present

## 2017-09-06 DIAGNOSIS — N186 End stage renal disease: Secondary | ICD-10-CM | POA: Diagnosis not present

## 2017-09-08 DIAGNOSIS — D509 Iron deficiency anemia, unspecified: Secondary | ICD-10-CM | POA: Diagnosis not present

## 2017-09-08 DIAGNOSIS — N2581 Secondary hyperparathyroidism of renal origin: Secondary | ICD-10-CM | POA: Diagnosis not present

## 2017-09-08 DIAGNOSIS — N184 Chronic kidney disease, stage 4 (severe): Secondary | ICD-10-CM | POA: Diagnosis not present

## 2017-09-08 DIAGNOSIS — N186 End stage renal disease: Secondary | ICD-10-CM | POA: Diagnosis not present

## 2017-09-08 DIAGNOSIS — D631 Anemia in chronic kidney disease: Secondary | ICD-10-CM | POA: Diagnosis not present

## 2017-09-08 DIAGNOSIS — Z23 Encounter for immunization: Secondary | ICD-10-CM | POA: Diagnosis not present

## 2017-09-10 DIAGNOSIS — N184 Chronic kidney disease, stage 4 (severe): Secondary | ICD-10-CM | POA: Diagnosis not present

## 2017-09-10 DIAGNOSIS — Z23 Encounter for immunization: Secondary | ICD-10-CM | POA: Diagnosis not present

## 2017-09-10 DIAGNOSIS — N2581 Secondary hyperparathyroidism of renal origin: Secondary | ICD-10-CM | POA: Diagnosis not present

## 2017-09-10 DIAGNOSIS — D509 Iron deficiency anemia, unspecified: Secondary | ICD-10-CM | POA: Diagnosis not present

## 2017-09-10 DIAGNOSIS — N186 End stage renal disease: Secondary | ICD-10-CM | POA: Diagnosis not present

## 2017-09-10 DIAGNOSIS — D631 Anemia in chronic kidney disease: Secondary | ICD-10-CM | POA: Diagnosis not present

## 2017-09-12 LAB — CBC AND DIFFERENTIAL: Hemoglobin: 11 — AB (ref 13.5–17.5)

## 2017-09-12 LAB — BASIC METABOLIC PANEL: Potassium: 5.1 (ref 3.4–5.3)

## 2017-09-13 DIAGNOSIS — D509 Iron deficiency anemia, unspecified: Secondary | ICD-10-CM | POA: Diagnosis not present

## 2017-09-13 DIAGNOSIS — N186 End stage renal disease: Secondary | ICD-10-CM | POA: Diagnosis not present

## 2017-09-13 DIAGNOSIS — D631 Anemia in chronic kidney disease: Secondary | ICD-10-CM | POA: Diagnosis not present

## 2017-09-13 DIAGNOSIS — N184 Chronic kidney disease, stage 4 (severe): Secondary | ICD-10-CM | POA: Diagnosis not present

## 2017-09-13 DIAGNOSIS — Z23 Encounter for immunization: Secondary | ICD-10-CM | POA: Diagnosis not present

## 2017-09-13 DIAGNOSIS — N2581 Secondary hyperparathyroidism of renal origin: Secondary | ICD-10-CM | POA: Diagnosis not present

## 2017-09-15 DIAGNOSIS — N2581 Secondary hyperparathyroidism of renal origin: Secondary | ICD-10-CM | POA: Diagnosis not present

## 2017-09-15 DIAGNOSIS — Z23 Encounter for immunization: Secondary | ICD-10-CM | POA: Diagnosis not present

## 2017-09-15 DIAGNOSIS — D509 Iron deficiency anemia, unspecified: Secondary | ICD-10-CM | POA: Diagnosis not present

## 2017-09-15 DIAGNOSIS — D631 Anemia in chronic kidney disease: Secondary | ICD-10-CM | POA: Diagnosis not present

## 2017-09-15 DIAGNOSIS — N186 End stage renal disease: Secondary | ICD-10-CM | POA: Diagnosis not present

## 2017-09-15 DIAGNOSIS — N184 Chronic kidney disease, stage 4 (severe): Secondary | ICD-10-CM | POA: Diagnosis not present

## 2017-09-17 DIAGNOSIS — N186 End stage renal disease: Secondary | ICD-10-CM | POA: Diagnosis not present

## 2017-09-17 DIAGNOSIS — D631 Anemia in chronic kidney disease: Secondary | ICD-10-CM | POA: Diagnosis not present

## 2017-09-17 DIAGNOSIS — Z23 Encounter for immunization: Secondary | ICD-10-CM | POA: Diagnosis not present

## 2017-09-17 DIAGNOSIS — N2581 Secondary hyperparathyroidism of renal origin: Secondary | ICD-10-CM | POA: Diagnosis not present

## 2017-09-17 DIAGNOSIS — D509 Iron deficiency anemia, unspecified: Secondary | ICD-10-CM | POA: Diagnosis not present

## 2017-09-17 DIAGNOSIS — N184 Chronic kidney disease, stage 4 (severe): Secondary | ICD-10-CM | POA: Diagnosis not present

## 2017-09-20 DIAGNOSIS — Z23 Encounter for immunization: Secondary | ICD-10-CM | POA: Diagnosis not present

## 2017-09-20 DIAGNOSIS — N184 Chronic kidney disease, stage 4 (severe): Secondary | ICD-10-CM | POA: Diagnosis not present

## 2017-09-20 DIAGNOSIS — N2581 Secondary hyperparathyroidism of renal origin: Secondary | ICD-10-CM | POA: Diagnosis not present

## 2017-09-20 DIAGNOSIS — D509 Iron deficiency anemia, unspecified: Secondary | ICD-10-CM | POA: Diagnosis not present

## 2017-09-20 DIAGNOSIS — D631 Anemia in chronic kidney disease: Secondary | ICD-10-CM | POA: Diagnosis not present

## 2017-09-20 DIAGNOSIS — N186 End stage renal disease: Secondary | ICD-10-CM | POA: Diagnosis not present

## 2017-09-22 DIAGNOSIS — N184 Chronic kidney disease, stage 4 (severe): Secondary | ICD-10-CM | POA: Diagnosis not present

## 2017-09-22 DIAGNOSIS — Z23 Encounter for immunization: Secondary | ICD-10-CM | POA: Diagnosis not present

## 2017-09-22 DIAGNOSIS — N2581 Secondary hyperparathyroidism of renal origin: Secondary | ICD-10-CM | POA: Diagnosis not present

## 2017-09-22 DIAGNOSIS — D509 Iron deficiency anemia, unspecified: Secondary | ICD-10-CM | POA: Diagnosis not present

## 2017-09-22 DIAGNOSIS — N186 End stage renal disease: Secondary | ICD-10-CM | POA: Diagnosis not present

## 2017-09-22 DIAGNOSIS — D631 Anemia in chronic kidney disease: Secondary | ICD-10-CM | POA: Diagnosis not present

## 2017-09-24 DIAGNOSIS — N2581 Secondary hyperparathyroidism of renal origin: Secondary | ICD-10-CM | POA: Diagnosis not present

## 2017-09-24 DIAGNOSIS — N184 Chronic kidney disease, stage 4 (severe): Secondary | ICD-10-CM | POA: Diagnosis not present

## 2017-09-24 DIAGNOSIS — D509 Iron deficiency anemia, unspecified: Secondary | ICD-10-CM | POA: Diagnosis not present

## 2017-09-24 DIAGNOSIS — D631 Anemia in chronic kidney disease: Secondary | ICD-10-CM | POA: Diagnosis not present

## 2017-09-24 DIAGNOSIS — Z23 Encounter for immunization: Secondary | ICD-10-CM | POA: Diagnosis not present

## 2017-09-24 DIAGNOSIS — N186 End stage renal disease: Secondary | ICD-10-CM | POA: Diagnosis not present

## 2017-09-27 DIAGNOSIS — N184 Chronic kidney disease, stage 4 (severe): Secondary | ICD-10-CM | POA: Diagnosis not present

## 2017-09-27 DIAGNOSIS — N186 End stage renal disease: Secondary | ICD-10-CM | POA: Diagnosis not present

## 2017-09-27 DIAGNOSIS — N2581 Secondary hyperparathyroidism of renal origin: Secondary | ICD-10-CM | POA: Diagnosis not present

## 2017-09-27 DIAGNOSIS — Z23 Encounter for immunization: Secondary | ICD-10-CM | POA: Diagnosis not present

## 2017-09-27 DIAGNOSIS — D509 Iron deficiency anemia, unspecified: Secondary | ICD-10-CM | POA: Diagnosis not present

## 2017-09-27 DIAGNOSIS — D631 Anemia in chronic kidney disease: Secondary | ICD-10-CM | POA: Diagnosis not present

## 2017-09-29 DIAGNOSIS — Z23 Encounter for immunization: Secondary | ICD-10-CM | POA: Diagnosis not present

## 2017-09-29 DIAGNOSIS — N186 End stage renal disease: Secondary | ICD-10-CM | POA: Diagnosis not present

## 2017-09-29 DIAGNOSIS — N2581 Secondary hyperparathyroidism of renal origin: Secondary | ICD-10-CM | POA: Diagnosis not present

## 2017-09-29 DIAGNOSIS — D631 Anemia in chronic kidney disease: Secondary | ICD-10-CM | POA: Diagnosis not present

## 2017-09-29 DIAGNOSIS — D509 Iron deficiency anemia, unspecified: Secondary | ICD-10-CM | POA: Diagnosis not present

## 2017-09-29 DIAGNOSIS — N184 Chronic kidney disease, stage 4 (severe): Secondary | ICD-10-CM | POA: Diagnosis not present

## 2017-09-30 ENCOUNTER — Other Ambulatory Visit: Payer: Self-pay | Admitting: Internal Medicine

## 2017-10-01 DIAGNOSIS — N184 Chronic kidney disease, stage 4 (severe): Secondary | ICD-10-CM | POA: Diagnosis not present

## 2017-10-01 DIAGNOSIS — N186 End stage renal disease: Secondary | ICD-10-CM | POA: Diagnosis not present

## 2017-10-01 DIAGNOSIS — D509 Iron deficiency anemia, unspecified: Secondary | ICD-10-CM | POA: Diagnosis not present

## 2017-10-01 DIAGNOSIS — N2581 Secondary hyperparathyroidism of renal origin: Secondary | ICD-10-CM | POA: Diagnosis not present

## 2017-10-01 DIAGNOSIS — D631 Anemia in chronic kidney disease: Secondary | ICD-10-CM | POA: Diagnosis not present

## 2017-10-01 DIAGNOSIS — Z23 Encounter for immunization: Secondary | ICD-10-CM | POA: Diagnosis not present

## 2017-10-04 DIAGNOSIS — D631 Anemia in chronic kidney disease: Secondary | ICD-10-CM | POA: Diagnosis not present

## 2017-10-04 DIAGNOSIS — N186 End stage renal disease: Secondary | ICD-10-CM | POA: Diagnosis not present

## 2017-10-04 DIAGNOSIS — N2889 Other specified disorders of kidney and ureter: Secondary | ICD-10-CM | POA: Diagnosis not present

## 2017-10-04 DIAGNOSIS — Z992 Dependence on renal dialysis: Secondary | ICD-10-CM | POA: Diagnosis not present

## 2017-10-04 DIAGNOSIS — N2581 Secondary hyperparathyroidism of renal origin: Secondary | ICD-10-CM | POA: Diagnosis not present

## 2017-10-04 DIAGNOSIS — N184 Chronic kidney disease, stage 4 (severe): Secondary | ICD-10-CM | POA: Diagnosis not present

## 2017-10-06 DIAGNOSIS — N2581 Secondary hyperparathyroidism of renal origin: Secondary | ICD-10-CM | POA: Diagnosis not present

## 2017-10-06 DIAGNOSIS — N186 End stage renal disease: Secondary | ICD-10-CM | POA: Diagnosis not present

## 2017-10-06 DIAGNOSIS — D631 Anemia in chronic kidney disease: Secondary | ICD-10-CM | POA: Diagnosis not present

## 2017-10-06 DIAGNOSIS — N184 Chronic kidney disease, stage 4 (severe): Secondary | ICD-10-CM | POA: Diagnosis not present

## 2017-10-08 DIAGNOSIS — N186 End stage renal disease: Secondary | ICD-10-CM | POA: Diagnosis not present

## 2017-10-08 DIAGNOSIS — D631 Anemia in chronic kidney disease: Secondary | ICD-10-CM | POA: Diagnosis not present

## 2017-10-08 DIAGNOSIS — N184 Chronic kidney disease, stage 4 (severe): Secondary | ICD-10-CM | POA: Diagnosis not present

## 2017-10-08 DIAGNOSIS — N2581 Secondary hyperparathyroidism of renal origin: Secondary | ICD-10-CM | POA: Diagnosis not present

## 2017-10-10 ENCOUNTER — Telehealth: Payer: Self-pay | Admitting: *Deleted

## 2017-10-10 ENCOUNTER — Other Ambulatory Visit: Payer: Self-pay

## 2017-10-10 ENCOUNTER — Encounter: Payer: Self-pay | Admitting: Nurse Practitioner

## 2017-10-10 ENCOUNTER — Ambulatory Visit (INDEPENDENT_AMBULATORY_CARE_PROVIDER_SITE_OTHER): Payer: Medicare Other | Admitting: Nurse Practitioner

## 2017-10-10 ENCOUNTER — Encounter (HOSPITAL_COMMUNITY): Payer: Self-pay

## 2017-10-10 ENCOUNTER — Emergency Department (HOSPITAL_COMMUNITY): Payer: Medicare Other

## 2017-10-10 ENCOUNTER — Observation Stay (HOSPITAL_COMMUNITY)
Admission: EM | Admit: 2017-10-10 | Discharge: 2017-10-11 | Disposition: A | Discharge status: Home / Self Care | Payer: Medicare Other | Attending: Internal Medicine | Admitting: Internal Medicine

## 2017-10-10 VITALS — BP 71/45 | HR 75 | Ht 69.0 in | Wt 177.8 lb

## 2017-10-10 DIAGNOSIS — Z8674 Personal history of sudden cardiac arrest: Secondary | ICD-10-CM | POA: Diagnosis not present

## 2017-10-10 DIAGNOSIS — E785 Hyperlipidemia, unspecified: Secondary | ICD-10-CM | POA: Insufficient documentation

## 2017-10-10 DIAGNOSIS — I10 Essential (primary) hypertension: Secondary | ICD-10-CM | POA: Diagnosis present

## 2017-10-10 DIAGNOSIS — Z9842 Cataract extraction status, left eye: Secondary | ICD-10-CM | POA: Diagnosis not present

## 2017-10-10 DIAGNOSIS — E871 Hypo-osmolality and hyponatremia: Secondary | ICD-10-CM | POA: Diagnosis not present

## 2017-10-10 DIAGNOSIS — K219 Gastro-esophageal reflux disease without esophagitis: Secondary | ICD-10-CM | POA: Diagnosis not present

## 2017-10-10 DIAGNOSIS — Z66 Do not resuscitate: Secondary | ICD-10-CM | POA: Insufficient documentation

## 2017-10-10 DIAGNOSIS — M109 Gout, unspecified: Secondary | ICD-10-CM | POA: Insufficient documentation

## 2017-10-10 DIAGNOSIS — I25110 Atherosclerotic heart disease of native coronary artery with unstable angina pectoris: Secondary | ICD-10-CM | POA: Diagnosis not present

## 2017-10-10 DIAGNOSIS — I509 Heart failure, unspecified: Secondary | ICD-10-CM | POA: Diagnosis not present

## 2017-10-10 DIAGNOSIS — R9401 Abnormal electroencephalogram [EEG]: Secondary | ICD-10-CM | POA: Diagnosis not present

## 2017-10-10 DIAGNOSIS — Z961 Presence of intraocular lens: Secondary | ICD-10-CM | POA: Insufficient documentation

## 2017-10-10 DIAGNOSIS — I132 Hypertensive heart and chronic kidney disease with heart failure and with stage 5 chronic kidney disease, or end stage renal disease: Secondary | ICD-10-CM | POA: Diagnosis not present

## 2017-10-10 DIAGNOSIS — Z9841 Cataract extraction status, right eye: Secondary | ICD-10-CM | POA: Diagnosis not present

## 2017-10-10 DIAGNOSIS — I959 Hypotension, unspecified: Principal | ICD-10-CM | POA: Diagnosis present

## 2017-10-10 DIAGNOSIS — N186 End stage renal disease: Secondary | ICD-10-CM

## 2017-10-10 DIAGNOSIS — Z951 Presence of aortocoronary bypass graft: Secondary | ICD-10-CM

## 2017-10-10 DIAGNOSIS — Z953 Presence of xenogenic heart valve: Secondary | ICD-10-CM | POA: Diagnosis not present

## 2017-10-10 DIAGNOSIS — Z79899 Other long term (current) drug therapy: Secondary | ICD-10-CM | POA: Diagnosis not present

## 2017-10-10 DIAGNOSIS — Z992 Dependence on renal dialysis: Secondary | ICD-10-CM | POA: Diagnosis not present

## 2017-10-10 DIAGNOSIS — Z9889 Other specified postprocedural states: Secondary | ICD-10-CM | POA: Diagnosis not present

## 2017-10-10 DIAGNOSIS — R9431 Abnormal electrocardiogram [ECG] [EKG]: Secondary | ICD-10-CM | POA: Diagnosis not present

## 2017-10-10 DIAGNOSIS — D631 Anemia in chronic kidney disease: Secondary | ICD-10-CM | POA: Insufficient documentation

## 2017-10-10 DIAGNOSIS — I25810 Atherosclerosis of coronary artery bypass graft(s) without angina pectoris: Secondary | ICD-10-CM | POA: Diagnosis not present

## 2017-10-10 DIAGNOSIS — I953 Hypotension of hemodialysis: Secondary | ICD-10-CM

## 2017-10-10 DIAGNOSIS — I700 Atherosclerosis of aorta: Secondary | ICD-10-CM | POA: Insufficient documentation

## 2017-10-10 DIAGNOSIS — E875 Hyperkalemia: Secondary | ICD-10-CM | POA: Diagnosis present

## 2017-10-10 DIAGNOSIS — R0602 Shortness of breath: Secondary | ICD-10-CM | POA: Diagnosis not present

## 2017-10-10 DIAGNOSIS — M1990 Unspecified osteoarthritis, unspecified site: Secondary | ICD-10-CM | POA: Diagnosis not present

## 2017-10-10 DIAGNOSIS — N2581 Secondary hyperparathyroidism of renal origin: Secondary | ICD-10-CM | POA: Diagnosis not present

## 2017-10-10 DIAGNOSIS — I447 Left bundle-branch block, unspecified: Secondary | ICD-10-CM | POA: Insufficient documentation

## 2017-10-10 DIAGNOSIS — R0902 Hypoxemia: Secondary | ICD-10-CM | POA: Diagnosis not present

## 2017-10-10 HISTORY — DX: End stage renal disease: N18.6

## 2017-10-10 HISTORY — DX: Dependence on renal dialysis: Z99.2

## 2017-10-10 LAB — CBC WITH DIFFERENTIAL/PLATELET
ABS IMMATURE GRANULOCYTES: 0.1 10*3/uL (ref 0.0–0.1)
Basophils Absolute: 0.1 10*3/uL (ref 0.0–0.1)
Basophils Relative: 1 %
Eosinophils Absolute: 0.6 10*3/uL (ref 0.0–0.7)
Eosinophils Relative: 7 %
HEMATOCRIT: 35.9 % — AB (ref 39.0–52.0)
HEMOGLOBIN: 11.9 g/dL — AB (ref 13.0–17.0)
IMMATURE GRANULOCYTES: 1 %
LYMPHS ABS: 1.5 10*3/uL (ref 0.7–4.0)
Lymphocytes Relative: 16 %
MCH: 34 pg (ref 26.0–34.0)
MCHC: 33.1 g/dL (ref 30.0–36.0)
MCV: 102.6 fL — ABNORMAL HIGH (ref 78.0–100.0)
MONO ABS: 1.1 10*3/uL — AB (ref 0.1–1.0)
MONOS PCT: 11 %
NEUTROS ABS: 6 10*3/uL (ref 1.7–7.7)
NEUTROS PCT: 64 %
Platelets: 182 10*3/uL (ref 150–400)
RBC: 3.5 MIL/uL — ABNORMAL LOW (ref 4.22–5.81)
RDW: 14 % (ref 11.5–15.5)
WBC: 9.3 10*3/uL (ref 4.0–10.5)

## 2017-10-10 LAB — COMPREHENSIVE METABOLIC PANEL
ALK PHOS: 55 U/L (ref 38–126)
ALT: 12 U/L (ref 0–44)
AST: 9 U/L — AB (ref 15–41)
Albumin: 3.6 g/dL (ref 3.5–5.0)
Anion gap: 16 — ABNORMAL HIGH (ref 5–15)
BILIRUBIN TOTAL: 0.7 mg/dL (ref 0.3–1.2)
BUN: 67 mg/dL — AB (ref 8–23)
CALCIUM: 9.7 mg/dL (ref 8.9–10.3)
CHLORIDE: 96 mmol/L — AB (ref 98–111)
CO2: 19 mmol/L — ABNORMAL LOW (ref 22–32)
CREATININE: 9.58 mg/dL — AB (ref 0.61–1.24)
GFR calc Af Amer: 5 mL/min — ABNORMAL LOW (ref >60)
GFR, EST NON AFRICAN AMERICAN: 4 mL/min — AB (ref >60)
Glucose, Bld: 98 mg/dL (ref 70–99)
Potassium: 6.7 mmol/L (ref 3.5–5.1)
Sodium: 131 mmol/L — ABNORMAL LOW (ref 135–145)
TOTAL PROTEIN: 7.5 g/dL (ref 6.5–8.1)

## 2017-10-10 LAB — CBC AND DIFFERENTIAL: HEMOGLOBIN: 11.3 — AB (ref 13.5–17.5)

## 2017-10-10 LAB — BASIC METABOLIC PANEL: POTASSIUM: 5.3 (ref 3.4–5.3)

## 2017-10-10 LAB — I-STAT TROPONIN, ED: TROPONIN I, POC: 0.03 ng/mL (ref 0.00–0.08)

## 2017-10-10 LAB — CBG MONITORING, ED: Glucose-Capillary: 94 mg/dL (ref 70–99)

## 2017-10-10 LAB — I-STAT CG4 LACTIC ACID, ED: Lactic Acid, Venous: 1.3 mmol/L (ref 0.5–1.9)

## 2017-10-10 MED ORDER — DOCUSATE SODIUM 283 MG RE ENEM
1.0000 | ENEMA | RECTAL | Status: DC | PRN
  Filled 2017-10-10: qty 1

## 2017-10-10 MED ORDER — SODIUM CHLORIDE 0.9 % IV BOLUS: 1000.0000 mL | Freq: Once | INTRAVENOUS | Status: DC

## 2017-10-10 MED ORDER — ACETAMINOPHEN 650 MG RE SUPP: 650.0000 mg | Freq: Four times a day (QID) | RECTAL | Status: DC | PRN

## 2017-10-10 MED ORDER — ONDANSETRON HCL 4 MG/2ML IJ SOLN: 4.0000 mg | Freq: Four times a day (QID) | INTRAMUSCULAR | Status: DC | PRN

## 2017-10-10 MED ORDER — PANTOPRAZOLE SODIUM 40 MG PO TBEC
40.0000 mg | DELAYED_RELEASE_TABLET | Freq: Every day | ORAL | Status: DC
  Administered 2017-10-11: 40 mg via ORAL
  Filled 2017-10-10 (×2): qty 1

## 2017-10-10 MED ORDER — PENTAFLUOROPROP-TETRAFLUOROETH EX AERO
1.0000 "application " | INHALATION_SPRAY | CUTANEOUS | Status: DC | PRN
  Filled 2017-10-10: qty 103.5

## 2017-10-10 MED ORDER — CINACALCET HCL 30 MG PO TABS: 60.0000 mg | ORAL_TABLET | ORAL | Status: DC

## 2017-10-10 MED ORDER — HYDROXYZINE HCL 25 MG PO TABS: 25.0000 mg | ORAL_TABLET | Freq: Three times a day (TID) | ORAL | Status: DC | PRN

## 2017-10-10 MED ORDER — NEPRO/CARBSTEADY PO LIQD
237.0000 mL | Freq: Three times a day (TID) | ORAL | Status: DC | PRN
  Filled 2017-10-10: qty 237

## 2017-10-10 MED ORDER — ONDANSETRON HCL 4 MG PO TABS: 4.0000 mg | ORAL_TABLET | Freq: Four times a day (QID) | ORAL | Status: DC | PRN

## 2017-10-10 MED ORDER — LIDOCAINE-PRILOCAINE 2.5-2.5 % EX CREA
1.0000 "application " | TOPICAL_CREAM | CUTANEOUS | Status: DC | PRN
  Filled 2017-10-10: qty 5

## 2017-10-10 MED ORDER — ALLOPURINOL 100 MG PO TABS
100.0000 mg | ORAL_TABLET | Freq: Every day | ORAL | Status: DC
  Administered 2017-10-11: 100 mg via ORAL
  Filled 2017-10-10: qty 1

## 2017-10-10 MED ORDER — SORBITOL 70 % SOLN
30.0000 mL | Status: DC | PRN
  Filled 2017-10-10: qty 30

## 2017-10-10 MED ORDER — SODIUM CHLORIDE 0.9 % IV SOLN: 100.0000 mL | INTRAVENOUS | Status: DC | PRN

## 2017-10-10 MED ORDER — CALCIUM CARBONATE ANTACID 1250 MG/5ML PO SUSP
500.0000 mg | Freq: Four times a day (QID) | ORAL | Status: DC | PRN
  Filled 2017-10-10: qty 5

## 2017-10-10 MED ORDER — ZOLPIDEM TARTRATE 5 MG PO TABS: 5.0000 mg | ORAL_TABLET | Freq: Every evening | ORAL | Status: DC | PRN

## 2017-10-10 MED ORDER — ATORVASTATIN CALCIUM 20 MG PO TABS
20.0000 mg | ORAL_TABLET | ORAL | Status: DC
  Administered 2017-10-11: 20 mg via ORAL
  Filled 2017-10-10: qty 1

## 2017-10-10 MED ORDER — ENOXAPARIN SODIUM 30 MG/0.3ML ~~LOC~~ SOLN
30.0000 mg | SUBCUTANEOUS | Status: DC
  Administered 2017-10-10: 30 mg via SUBCUTANEOUS
  Filled 2017-10-10: qty 0.3

## 2017-10-10 MED ORDER — DOCUSATE SODIUM 100 MG PO CAPS
100.0000 mg | ORAL_CAPSULE | Freq: Two times a day (BID) | ORAL | Status: DC
  Administered 2017-10-10 – 2017-10-11 (×2): 100 mg via ORAL
  Filled 2017-10-10 (×2): qty 1

## 2017-10-10 MED ORDER — CHLORHEXIDINE GLUCONATE CLOTH 2 % EX PADS: 6.0000 | MEDICATED_PAD | Freq: Every day | CUTANEOUS | Status: DC

## 2017-10-10 MED ORDER — SODIUM CHLORIDE 0.9 % IV BOLUS
250.0000 mL | Freq: Once | INTRAVENOUS | Status: AC
  Administered 2017-10-10: 250 mL via INTRAVENOUS

## 2017-10-10 MED ORDER — ACETAMINOPHEN 325 MG PO TABS: 650.0000 mg | ORAL_TABLET | Freq: Four times a day (QID) | ORAL | Status: DC | PRN

## 2017-10-10 MED ORDER — METOPROLOL TARTRATE 12.5 MG HALF TABLET
12.5000 mg | ORAL_TABLET | Freq: Two times a day (BID) | ORAL | Status: DC
  Administered 2017-10-10 – 2017-10-11 (×2): 12.5 mg via ORAL
  Filled 2017-10-10 (×2): qty 1

## 2017-10-10 MED ORDER — SODIUM CHLORIDE 0.9% FLUSH: 3.0000 mL | Freq: Two times a day (BID) | INTRAVENOUS | Status: DC

## 2017-10-10 MED ORDER — CAMPHOR-MENTHOL 0.5-0.5 % EX LOTN
1.0000 "application " | TOPICAL_LOTION | Freq: Three times a day (TID) | CUTANEOUS | Status: DC | PRN
  Filled 2017-10-10: qty 222

## 2017-10-10 MED ORDER — CALCIUM CARBONATE ANTACID 500 MG PO CHEW: 2.0000 | CHEWABLE_TABLET | Freq: Every day | ORAL | Status: DC | PRN

## 2017-10-10 MED ORDER — SODIUM CHLORIDE 0.9 % IV BOLUS: 500.0000 mL | Freq: Once | INTRAVENOUS | Status: DC

## 2017-10-10 MED ORDER — SODIUM POLYSTYRENE SULFONATE 15 GM/60ML PO SUSP
30.0000 g | Freq: Once | ORAL | Status: AC
  Administered 2017-10-10: 30 g via ORAL
  Filled 2017-10-10: qty 120

## 2017-10-10 NOTE — Telephone Encounter (Signed)
Tried to reach out to pts son to let him know that we had to send his dad to the ED and his mom had to ride with them. Left a detailed message.

## 2017-10-10 NOTE — Progress Notes (Signed)
CARDIOLOGY OFFICE NOTE  Date:  10/10/2017    Eric [name] Date of Birth: January 09, 1930 Medical Record #[id number]  PCP:  Gayland Curry, DO  Cardiologist:  Ree Shay   Chief Complaint  Patient presents with  . Coronary Artery Disease  . Congestive Heart Failure  . Hyperlipidemia    6 month check - seen for Dr. Caryl Comes    History of Present Illness: Eric [name] is a 84 y.o. male who presents today for a 6 month check. Seen for Dr. Caryl Comes.   He has a history of ESRD on HD via AV fistula, insomnia, HTN, and GERD. Reported stress test at [facility] in [place], [place] 2 summers ago - noted in the record that he "has a problem with the one chamber of his heart not being attached properly and he knows about it since childhood". No actual documentation of this. Reports history of cardiac arrest approximately 2 years ago while having fistula procedure - sounds like he had a stress test at that time.   I saw him originally in November of 2016 as a new patient for DOE - had abnormal EKG and murmur on exam. Ended up getting echo, CT (negative for PE) and cath.   Cardiac cath on 11/27/2014 showed severe 3-vessel coronary disease. The proximal and mid LAD were heavily calcified with 80% proximal and 99% mid LAD stenosis followed by a 90% mid stenosis. The LCX had a 60% stenosis in a large OM2 and an occluded OM3 that long but small in diameter or underfilled by the collat from the LAD. The RCA is occluded distally with faint filling of a small diameter PDA branch by collat from the LAD. His echo showed at least moderate AS with a mean gradient of 36 mm Hg and a DI of 0.21. LVEF was reduced to 35% with anterior, septal and inferior wall motion abnormalities.   He underwent CABG x 3 with LIMA to LAD, SVG to OM2 and SVG to PDA with AVR with #23 Edwards Magna-Ease pericardial valve by Dr. Cyndia Bent on 12/09/2014. Nephrology assisted with his dialysis. He did initially require some  inotropic support but this was able to be weaned over time without difficulty. He had no significant postoperative cardiac dysrhythmias. He is on chronic Aranesp managed by the nephrologist. He has done well since - he did have lots of fatigue in 2018 - echo was updated and this turned out ok.   Last seen by me back in March - felt to be doing ok. Still fatigued but cardiac status ok.   Comes back today. Here with his wife. He continues to be fatigued - this has been worse over the past month. BP is quite low - very hard to auscultate. He is due for dialysis tomorrow. Says he does not hurt anywhere. Does have a cough - seems not productive. No chest pain. Breathing is ok. Just profound fatigue. Not really dizzy or lightheaded. He does restrict his salt. He has taken his medicines today. He makes very little urine.   Past Medical History:  Diagnosis Date  . Anxiety   . Arthritis   . CKD (chronic kidney disease) stage 5, GFR less than 15 ml/min (HCC)   . Complication of anesthesia    " one time my heart stopped due to a medication that I was on."   . GERD (gastroesophageal reflux disease)   . Gout   . Heart murmur    as a teen  .  Hemodialysis patient [facility])    Tues, Thursday, Saturday  . Herpes zoster 04/2012  . Hypertension   . Shortness of breath dyspnea    with exertion  . Unspecified essential hypertension     Past Surgical History:  Procedure Laterality Date  . AORTIC VALVE REPLACEMENT N/A 12/09/2014   Procedure: AORTIC VALVE REPLACEMENT (AVR) WITH 23MM MAGNA EASE BIOPROSTHETIC VALVE;  Surgeon: Gaye Pollack, MD;  Location: [facility] OR;  Service: Open Heart Surgery;  Laterality: N/A;  . APPENDECTOMY  1944  . AV FISTULA PLACEMENT Left    Left Cimino AVF placed in [place]   . AV FISTULA PLACEMENT Right 03/06/2014   Procedure: ARTERIOVENOUS (AV) FISTULA CREATION-right radiocephalic;  Surgeon: Mal Misty, MD;  Location: [facility] OR;  Service: Vascular;  Laterality: Right;  . AV FISTULA  PLACEMENT Right 07/15/2014   Procedure: ARTERIOVENOUS (AV) FISTULA CREATION;  Surgeon: Elam Dutch, MD;  Location: [facility];  Service: Vascular;  Laterality: Right;  . CARDIAC CATHETERIZATION N/A 11/27/2014   Procedure: Right/Left Heart Cath and Coronary Angiography;  Surgeon: Burnell Blanks, MD;  Location: [facility] CV LAB;  Service: Cardiovascular;  Laterality: N/A;  . CATARACT EXTRACTION W/ INTRAOCULAR LENS IMPLANT Bilateral 2014   [facility]  . COLONOSCOPY  2013   Dr. Annamaria Helling [place], [place].  . CORONARY ARTERY BYPASS GRAFT N/A 12/09/2014   Procedure: CORONARY ARTERY BYPASS GRAFTING (CABG), ON PUMP, TIMES THREE, USING LEFT INTERNAL MAMMARY ARTERY, RIGHT GREATER SAPHENOUS VEIN HARVESTED ENDOSCOPICALLY;  Surgeon: Gaye Pollack, MD;  Location: [facility];  Service: Open Heart Surgery;  Laterality: N/A;  LIMA-LAD; SVG-OM; SVG-PD  . DIALYSIS FISTULA CREATION  08/04/2012 and 10/13   Dr. Harden [place]  . EYE SURGERY     Cataract  . INSERTION OF DIALYSIS CATHETER Right 01-15-14   Right chest TDC placed by Dr. Augustin Coupe at [facility]  . LIGATION OF ARTERIOVENOUS  FISTULA Right 07/15/2014   Procedure: LIGATION OF ARTERIOVENOUS  FISTULA  (RIGHT RADIOCEPHALIC);  Surgeon: Elam Dutch, MD;  Location: [employer] [facility];  Service: Vascular;  Laterality: Right;  . LIGATION OF COMPETING BRANCHES OF ARTERIOVENOUS FISTULA Right 05/08/2014   Procedure: LIGATION OF COMPETING BRANCHES OF RIGHT ARM RADIOCEPHALIC ARTERIOVENOUS FISTULA;  Surgeon: Mal Misty, MD;  Location: [facility];  Service: Vascular;  Laterality: Right;  . REVISON OF ARTERIOVENOUS FISTULA Right 07/15/2014   Procedure: EXPLORATION OF ARTERIOVENOUS FISTULA;  Surgeon: Elam Dutch, MD;  Location: [facility];  Service: Vascular;  Laterality: Right;  . TEE WITHOUT CARDIOVERSION N/A 12/09/2014   Procedure: TRANSESOPHAGEAL ECHOCARDIOGRAM (TEE);  Surgeon: Gaye Pollack, MD;  Location: [facility];  Service: Open Heart Surgery;  Laterality: N/A;      Medications: Current Meds  Medication Sig  . allopurinol (ZYLOPRIM) 100 MG tablet TAKE 1 TABLET BY MOUTH DAILY  . amLODipine (NORVASC) 10 MG tablet TAKE 1 TABLET BY MOUTH DAILY TO CONTROL BLOOD PRESSURE  . atorvastatin (LIPITOR) 20 MG tablet TAKE 1 TABLET BY MOUTH DAILY FOR CHOLESTEROL  . calcium carbonate (TUMS - DOSED IN MG ELEMENTAL CALCIUM) 500 MG chewable tablet Chew 2 tablets by mouth 3 (three) times daily with meals.  . cinacalcet (SENSIPAR) 60 MG tablet Take 60 mg by mouth 3 (three) times a week.  . loratadine (CLARITIN) 10 MG tablet Take 10 mg daily by mouth.  . metoprolol tartrate (LOPRESSOR) 25 MG tablet Take 0.5 tablets (12.5 mg total) by mouth 2 (two) times daily. Please make yearly appt with Truitt Merle,  NP for February. 1st attempt  . omeprazole (PRILOSEC) 20 MG capsule Take 20 mg by mouth daily.   . [DISCONTINUED] cinacalcet (SENSIPAR) 60 MG tablet TAKE 1 TABLET BY MOUTH DAILY WITH THE EVENING MEAL     Allergies: No Known Allergies  Social History: The patient  reports that he has never smoked. He has never used smokeless tobacco. He reports that he drinks about 2.0 standard drinks of alcohol per week. He reports that he does not use drugs.   Family History: The patient's family history includes Cancer in his mother; Diabetes in his father; Lung cancer (age of onset: 32) in his father.   Review of Systems: Please see the history of present illness.   Otherwise, the review of systems is positive for none.   All other systems are reviewed and negative.   Physical Exam: VS:  BP (!) 71/45   Pulse 75   Ht 5\' 9"  (1.753 m)   Wt 177 lb 12.8 oz (80.6 kg)   BMI 26.26 kg/m  .  BMI Body mass index is 26.26 kg/m.  Wt Readings from Last 3 Encounters:  10/10/17 177 lb 12.8 oz (80.6 kg)  06/15/17 174 lb (78.9 kg)  05/11/17 171 lb (77.6 kg)   His BP is 58 palpated by me.   General: Looks fatigued and chronically ill. A little disheveled in appearance.   HEENT:  Normal.  Neck: Supple, no JVD, carotid bruits, or masses noted.  Cardiac: Regular rate and rhythm. Few ectopics noted. No edema.  Respiratory:  Lungs are clear to auscultation bilaterally with normal work of breathing.  GI: Soft and nontender.  MS: No deformity or atrophy. Gait and ROM intact.  Skin: Warm and dry. Color is sallow.  Neuro:  Strength and sensation are intact and no gross focal deficits noted.  Psych: Alert, appropriate and with normal affect.   LABORATORY DATA:  EKG:  EKG is ordered today. This demonstrates NSR with LBBB with first degree AV block.   Lab Results  Component Value Date   WBC 8.1 04/23/2015   HGB 12.9 (A) 05/02/2017   HCT 42 04/23/2015   PLT 154 04/23/2015   GLUCOSE 140 (H) 12/27/2014   CHOL 125 02/07/2017   TRIG 80 02/07/2017   HDL 37 (L) 02/07/2017   LDLCALC 72 02/07/2017   ALT 9 02/07/2017   AST 10 02/07/2017   NA 139 04/23/2015   K 4.9 05/02/2017   CL 100 12/27/2014   CREATININE 5.4 (A) 04/23/2015   BUN 39 (A) 04/23/2015   CO2 30 12/27/2014   INR 1.42 12/09/2014   HGBA1C 5.2 12/06/2014     BNP (last 3 results) No results for input(s): BNP in the last 8760 hours.  ProBNP (last 3 results) No results for input(s): PROBNP in the last 8760 hours.   Other Studies Reviewed Today:  Echo Study Conclusions 06/2016  - Procedure narrative: Transthoracic echocardiography. Image quality was adequate. Intravenous contrast (Definity) was administered. - Left ventricle: The cavity size was normal. Wall thickness was increased in a pattern of moderate LVH. Systolic function was moderately reduced. The estimated ejection fraction was in the range of 35% to 40%. Akinesis of the mid-apicalanteroseptal myocardium. Doppler parameters are consistent with abnormal left ventricular relaxation (grade 1 diastolic dysfunction). - Aortic valve: A bioprosthesis was present. There was trivial regurgitation. - Mitral valve: There was mild  regurgitation. - Right atrium: The atrium was mildly dilated.    Assessment/Plan:  1. Marked hypotension - BP only 58  palpated by me. He has had his medicines today as well. I worry about possible infection/dehydration. Have recommended transfer to ER for evaluation. He needs labs and CXR and probable IVF. He is agreeable. Further disposition to follow.   2. CAD/AS - s/p prior CABG and AVR - no active cardiac symptoms other than marked hypotension. Needs labs to include cardiac enzymes.   3. Mild LV dysfunction - managed by dialysis. Not really short of breath.   4. ESRD - on dialysis - notes BP typically runs low - he holds his regular medicines until after treatment is complete.   5. Chronic Anemia - remains on Aranesp - managed by Renal.   6. HLD - on statin therapy. Not addressed today.    Current medicines are reviewed with the patient today.  The patient does not have concerns regarding medicines other than what has been noted above.  The following changes have been made:  See above.  Labs/ tests ordered today include:   No orders of the defined types were placed in this encounter.    Disposition:   Transferred to ER by EMS for further evaluation.    Patient is agreeable to this plan and will call if any problems develop in the interim.   SignedTruitt Merle, NP  10/10/2017 10:50 AM  [facility] [address] [place], [place]  [address] Phone: [phone] Fax: [phone]

## 2017-10-10 NOTE — ED Notes (Signed)
ED Provider at bedside. 

## 2017-10-10 NOTE — Progress Notes (Signed)
Received pt from [place], RN, HD tx initiated w/o problem @ 1625via 15Gx2 w/o problem by Melisa, RN per report, pull/push/flush well w/o problem per report, VSS per report, will cont to monitor while on HD tx

## 2017-10-10 NOTE — Procedures (Signed)
   I was present at this dialysis session, have reviewed the session itself and made  appropriate changes Kelly Splinter MD [facility] pager [phone]   10/10/2017, 4:20 PM

## 2017-10-10 NOTE — Progress Notes (Signed)
HD tx completed @ 1925 w/o problem, UF goal of keep even met, blood rinsed back, VSS, report called to Melody Haver, RN

## 2017-10-10 NOTE — ED Triage Notes (Signed)
Pt from PCP office with ems for hypotension and fatigue. Pt c.o feeling tired in the mornings. recieves dialysis T/TH/sat and is compliant with schedule. Denies pain, nausea, SOB. Pt a.o upon arrival, nad noted BP 94/62, 104/60 HR74 [id number]

## 2017-10-10 NOTE — Patient Instructions (Addendum)
We are sending you to the ER today for evaluation.

## 2017-10-10 NOTE — Consult Note (Signed)
CARDIOLOGY OFFICE NOTE  Date:  10/10/2017    Eric [name] Date of Birth: Mar 10, 1930 Medical Record #[id number]  PCP:  Gayland Curry, DO      Cardiologist:  Ree Shay       Chief Complaint  Patient presents with  . Coronary Artery Disease  . Congestive Heart Failure  . Hyperlipidemia    6 month check - seen for Dr. Caryl Comes    History of Present Illness: Eric [name] is a 84 y.o. male who presents today for a 6 month check. Seen for Dr. Caryl Comes.   He has a history of ESRD on HD via AV fistula, insomnia, HTN, and GERD. Reported stress test at [facility] in [place], [place] 2 summers ago - noted in the record that he "has a problem with the one chamber of his heart not being attached properly and he knows about it since childhood". No actual documentation of this. Reports history of cardiac arrest approximately 2 years ago while having fistula procedure - sounds like he had a stress test at that time.   I saw himoriginally inNovember of 2016as a new patient for DOE - had abnormal EKG and murmur on exam. Ended up getting echo, CT (negative for PE) and cath.   Cardiac cath on 11/27/2014 showed severe 3-vessel coronary disease. The proximal and mid LAD were heavily calcified with 80% proximal and 99% mid LAD stenosis followed by a 90% mid stenosis. The LCX had a 60% stenosis in a large OM2 and an occluded OM3 that long but small in diameter or underfilled by the collat from the LAD. The RCA is occluded distally with faint filling of a small diameter PDA branch by collat from the LAD. His echo showed at least moderate AS with a mean gradient of 36 mm Hg and a DI of 0.21. LVEF was reduced to 35% with anterior, septal and inferior wall motion abnormalities.   He underwent CABG x 3 with LIMA to LAD, SVG to OM2 and SVG to PDA with AVR with #23 Edwards Magna-Ease pericardial valve by Dr. Cyndia Bent on 12/09/2014. Nephrology assisted with his dialysis. He did initially  require some inotropic support but this was able to be weaned over time without difficulty. He had no significant postoperative cardiac dysrhythmias. He is on chronic Aranesp managed by the nephrologist. He has done well since - he did have lots of fatigue in 2018 - echo was updated and this turned out ok.   Last seen by me back in March - felt to be doing ok. Still fatigued but cardiac status ok.   Comes back today. Here withhis wife. He continues to be fatigued - this has been worse over the past month. BP is quite low - very hard to auscultate. He is due for dialysis tomorrow. Says he does not hurt anywhere. Does have a cough - seems not productive. No chest pain. Breathing is ok. Just profound fatigue. Not really dizzy or lightheaded. He does restrict his salt. He has taken his medicines today. He makes very little urine.       Past Medical History:  Diagnosis Date  . Anxiety   . Arthritis   . CKD (chronic kidney disease) stage 5, GFR less than 15 ml/min (HCC)   . Complication of anesthesia    " one time my heart stopped due to a medication that I was on."   . GERD (gastroesophageal reflux disease)   . Gout   . Heart murmur  as a teen  . Hemodialysis patient [facility])    Tues, Thursday, Saturday  . Herpes zoster 04/2012  . Hypertension   . Shortness of breath dyspnea    with exertion  . Unspecified essential hypertension          Past Surgical History:  Procedure Laterality Date  . AORTIC VALVE REPLACEMENT N/A 12/09/2014   Procedure: AORTIC VALVE REPLACEMENT (AVR) WITH 23MM MAGNA EASE BIOPROSTHETIC VALVE;  Surgeon: Gaye Pollack, MD;  Location: [facility] OR;  Service: Open Heart Surgery;  Laterality: N/A;  . APPENDECTOMY  1944  . AV FISTULA PLACEMENT Left    Left Cimino AVF placed in [place]   . AV FISTULA PLACEMENT Right 03/06/2014   Procedure: ARTERIOVENOUS (AV) FISTULA CREATION-right radiocephalic;  Surgeon: Mal Misty, MD;  Location: [facility] OR;   Service: Vascular;  Laterality: Right;  . AV FISTULA PLACEMENT Right 07/15/2014   Procedure: ARTERIOVENOUS (AV) FISTULA CREATION;  Surgeon: Elam Dutch, MD;  Location: [facility];  Service: Vascular;  Laterality: Right;  . CARDIAC CATHETERIZATION N/A 11/27/2014   Procedure: Right/Left Heart Cath and Coronary Angiography;  Surgeon: Burnell Blanks, MD;  Location: [facility] CV LAB;  Service: Cardiovascular;  Laterality: N/A;  . CATARACT EXTRACTION W/ INTRAOCULAR LENS IMPLANT Bilateral 2014   [facility]  . COLONOSCOPY  2013   Dr. Annamaria Helling [place], [place].  . CORONARY ARTERY BYPASS GRAFT N/A 12/09/2014   Procedure: CORONARY ARTERY BYPASS GRAFTING (CABG), ON PUMP, TIMES THREE, USING LEFT INTERNAL MAMMARY ARTERY, RIGHT GREATER SAPHENOUS VEIN HARVESTED ENDOSCOPICALLY;  Surgeon: Gaye Pollack, MD;  Location: [facility];  Service: Open Heart Surgery;  Laterality: N/A;  LIMA-LAD; SVG-OM; SVG-PD  . DIALYSIS FISTULA CREATION  08/04/2012 and 10/13   Dr. Harden [place]  . EYE SURGERY     Cataract  . INSERTION OF DIALYSIS CATHETER Right 01-15-14   Right chest TDC placed by Dr. Augustin Coupe at [facility]  . LIGATION OF ARTERIOVENOUS  FISTULA Right 07/15/2014   Procedure: LIGATION OF ARTERIOVENOUS  FISTULA  (RIGHT RADIOCEPHALIC);  Surgeon: Elam Dutch, MD;  Location: [facility];  Service: Vascular;  Laterality: Right;  . LIGATION OF COMPETING BRANCHES OF ARTERIOVENOUS FISTULA Right 05/08/2014   Procedure: LIGATION OF COMPETING BRANCHES OF RIGHT ARM RADIOCEPHALIC ARTERIOVENOUS FISTULA;  Surgeon: Mal Misty, MD;  Location: [facility];  Service: Vascular;  Laterality: Right;  . REVISON OF ARTERIOVENOUS FISTULA Right 07/15/2014   Procedure: EXPLORATION OF ARTERIOVENOUS FISTULA;  Surgeon: Elam Dutch, MD;  Location: [facility];  Service: Vascular;  Laterality: Right;  . TEE WITHOUT CARDIOVERSION N/A 12/09/2014   Procedure: TRANSESOPHAGEAL ECHOCARDIOGRAM (TEE);  Surgeon: Gaye Pollack, MD;  Location: [facility];  Service: Open Heart Surgery;  Laterality: N/A;     Medications: ActiveMedications      Current Meds  Medication Sig  . allopurinol (ZYLOPRIM) 100 MG tablet TAKE 1 TABLET BY MOUTH DAILY  . amLODipine (NORVASC) 10 MG tablet TAKE 1 TABLET BY MOUTH DAILY TO CONTROL BLOOD PRESSURE  . atorvastatin (LIPITOR) 20 MG tablet TAKE 1 TABLET BY MOUTH DAILY FOR CHOLESTEROL  . calcium carbonate (TUMS - DOSED IN MG ELEMENTAL CALCIUM) 500 MG chewable tablet Chew 2 tablets by mouth 3 (three) times daily with meals.  . cinacalcet (SENSIPAR) 60 MG tablet Take 60 mg by mouth 3 (three) times a week.  . loratadine (CLARITIN) 10 MG tablet Take 10 mg daily by mouth.  . metoprolol tartrate (LOPRESSOR) 25 MG tablet Take 0.5 tablets (  12.5 mg total) by mouth 2 (two) times daily. Please make yearly appt with Truitt Merle, NP for February. 1st attempt  . omeprazole (PRILOSEC) 20 MG capsule Take 20 mg by mouth daily.   . [DISCONTINUED] cinacalcet (SENSIPAR) 60 MG tablet TAKE 1 TABLET BY MOUTH DAILY WITH THE EVENING MEAL       Allergies: No Known Allergies  Social History: The patient  reports that he has never smoked. He has never used smokeless tobacco. He reports that he drinks about 2.0 standard drinks of alcohol per week. He reports that he does not use drugs.   Family History: The patient's family history includes Cancer in his mother; Diabetes in his father; Lung cancer (age of onset: 71) in his father.   Review of Systems: Please see the history of present illness.   Otherwise, the review of systems is positive for none.   All other systems are reviewed and negative.   Physical Exam: VS:  BP (!) 71/45   Pulse 75   Ht 5\' 9"  (1.753 m)   Wt 177 lb 12.8 oz (80.6 kg)   BMI 26.26 kg/m  .  BMI Body mass index is 26.26 kg/m.     Wt Readings from Last 3 Encounters:  10/10/17 177 lb 12.8 oz (80.6 kg)  06/15/17 174 lb (78.9 kg)  05/11/17 171 lb (77.6 kg)   His BP is 58 palpated by me.     General: Looks fatigued and chronically ill. A little disheveled in appearance.   HEENT: Normal.  Neck: Supple, no JVD, carotid bruits, or masses noted.  Cardiac: Regular rate and rhythm. Few ectopics noted. No edema.  Respiratory:  Lungs are clear to auscultation bilaterally with normal work of breathing.  GI: Soft and nontender.  MS: No deformity or atrophy. Gait and ROM intact.  Skin: Warm and dry. Color is sallow.  Neuro:  Strength and sensation are intact and no gross focal deficits noted.  Psych: Alert, appropriate and with normal affect.   LABORATORY DATA:  EKG:  EKG is ordered today. This demonstrates NSR with LBBB with first degree AV block.   RecentLabs       Lab Results  Component Value Date   WBC 8.1 04/23/2015   HGB 12.9 (A) 05/02/2017   HCT 42 04/23/2015   PLT 154 04/23/2015   GLUCOSE 140 (H) 12/27/2014   CHOL 125 02/07/2017   TRIG 80 02/07/2017   HDL 37 (L) 02/07/2017   LDLCALC 72 02/07/2017   ALT 9 02/07/2017   AST 10 02/07/2017   NA 139 04/23/2015   K 4.9 05/02/2017   CL 100 12/27/2014   CREATININE 5.4 (A) 04/23/2015   BUN 39 (A) 04/23/2015   CO2 30 12/27/2014   INR 1.42 12/09/2014   HGBA1C 5.2 12/06/2014       BNP (last 3 results) RecentLabs(withinlast365days)  No results for input(s): BNP in the last 8760 hours.    ProBNP (last 3 results) RecentLabs(withinlast365days)  No results for input(s): PROBNP in the last 8760 hours.     Other Studies Reviewed Today:  EchoStudy Conclusions6/2018  - Procedure narrative: Transthoracic echocardiography. Image quality was adequate. Intravenous contrast (Definity) was administered. - Left ventricle: The cavity size was normal. Wall thickness was increased in a pattern of moderate LVH. Systolic function was moderately reduced. The estimated ejection fraction was in the range of 35% to 40%. Akinesis of the mid-apicalanteroseptal myocardium.  Doppler parameters are consistent with abnormal left ventricular relaxation (grade 1 diastolic dysfunction). -  Aortic valve: A bioprosthesis was present. There was trivial regurgitation. - Mitral valve: There was mild regurgitation. - Right atrium: The atrium was mildly dilated.    Assessment/Plan:  1.Marked hypotension - BP only 58 palpated by me. He has had his medicines today as well. I worry about possible infection/dehydration. Have recommended transfer to ER for evaluation. He needs labs and CXR and probable IVF. He is agreeable. Further disposition to follow.   2. CAD/AS -s/p prior CABG and AVR- no active cardiac symptoms other than marked hypotension. Needs labs to include cardiac enzymes.   3. Mild LV dysfunction -managed by dialysis. Not really short of breath.  4. ESRD - on dialysis- notes BP typically runs low - he holds his regular medicines until after treatment is complete.   5. Chronic Anemia - remains on Aranesp - managed by Renal.   6. HLD - on statin therapy.Not addressed today.    Current medicines are reviewed with the patient today.  The patient does not have concerns regarding medicines other than what has been noted above.  The following changes have been made:  See above.  Labs/ tests ordered today include:   No orders of the defined types were placed in this encounter.    Disposition:  Transferred to ER by EMS for further evaluation.    Patient is agreeable to this plan and will call if any problems develop in the interim.   SignedTruitt Merle, NP  10/10/2017 10:50 AM  [facility] [address] [place], [place]  [address] Phone: [phone] Fax: [phone]      Patient seen and examined   I agree with findings as noted above by L Gerhardt above    Pt is a 84 yo with history of CAD (s/p CABG and AVR in 2016), mild LV dysfunction, ESRD  Came to cardiology clnic  today complaining of weakness And fatigue   He was hypotensive in office  Sent to ED  Pt currently undergoing dialysis   Comfortable   Hungry    Denies CP   Breathing is OK ON exam:   Lungs are rel clear Cardiac exam  Distant   RRR  No S3  No signif murmurs Abd is supple  Ext are without edema 2+ pulses   Will continue to follow  Labs pending    No new recommendations.  Dorris Carnes

## 2017-10-10 NOTE — Consult Note (Addendum)
Renal Service Consult Note [facility]  Eric [name] 10/10/2017 Sol Blazing Requesting Physician:  Dr Lorin Mercy  Reason for Consult:  ESRD pt w/ high K+ HPI: The patient is a 84 y.o. year-old w/ hx of ESRD, CABG/ AVR pericardial, HTN presented from cardiology office for fatigue and low BP's.  In ED here BP was 70's, now is in the 90's.  He takes 2 bp meds, metoprolol and amlodipine.  No anticoagulants. Last HD was Saturday. K+ in ED was 6.7, EKG shows OLD LBBB.    Reports increased fatigue over the last week.  States he has noticed his pants fitting tighter and increased cramping in his hands near the end of dialysis.  Appetite is good. Denies CP, SOB, n/v/d.    ROS  no joint pain   no HA  no blurry vision  no rash  no diarrhea  no nausea/ vomiting  no dysuria  no difficulty voiding  no change in urine color    Past Medical History  Past Medical History:  Diagnosis Date  . Anxiety   . Arthritis   . Complication of anesthesia    " one time my heart stopped due to a medication that I was on."   . ESRD (end stage renal disease) on dialysis ([place])    Tues, Thursday, Saturday  . GERD (gastroesophageal reflux disease)   . Gout   . Heart murmur    as a teen  . Herpes zoster 04/2012  . Hypertension   . Shortness of breath dyspnea    with exertion   Past Surgical History  Past Surgical History:  Procedure Laterality Date  . AORTIC VALVE REPLACEMENT N/A 12/09/2014   Procedure: AORTIC VALVE REPLACEMENT (AVR) WITH 23MM MAGNA EASE BIOPROSTHETIC VALVE;  Surgeon: Gaye Pollack, MD;  Location: [place] OR;  Service: Open Heart Surgery;  Laterality: N/A;  . APPENDECTOMY  1944  . AV FISTULA PLACEMENT Left    Left Cimino AVF placed in [place]   . AV FISTULA PLACEMENT Right 03/06/2014   Procedure: ARTERIOVENOUS (AV) FISTULA CREATION-right radiocephalic;  Surgeon: Mal Misty, MD;  Location: [facility] OR;  Service: Vascular;  Laterality: Right;  . AV FISTULA PLACEMENT Right  07/15/2014   Procedure: ARTERIOVENOUS (AV) FISTULA CREATION;  Surgeon: Elam Dutch, MD;  Location: [place];  Service: Vascular;  Laterality: Right;  . CARDIAC CATHETERIZATION N/A 11/27/2014   Procedure: Right/Left Heart Cath and Coronary Angiography;  Surgeon: Burnell Blanks, MD;  Location: [place] CV LAB;  Service: Cardiovascular;  Laterality: N/A;  . CATARACT EXTRACTION W/ INTRAOCULAR LENS IMPLANT Bilateral 2014   [facility]  . COLONOSCOPY  2013   Dr. Annamaria Helling [place], [place].  . CORONARY ARTERY BYPASS GRAFT N/A 12/09/2014   Procedure: CORONARY ARTERY BYPASS GRAFTING (CABG), ON PUMP, TIMES THREE, USING LEFT INTERNAL MAMMARY ARTERY, RIGHT GREATER SAPHENOUS VEIN HARVESTED ENDOSCOPICALLY;  Surgeon: Gaye Pollack, MD;  Location: [place];  Service: Open Heart Surgery;  Laterality: N/A;  LIMA-LAD; SVG-OM; SVG-PD  . DIALYSIS FISTULA CREATION  08/04/2012 and 10/13   Dr. Harden Mo  . EYE SURGERY     Cataract  . INSERTION OF DIALYSIS CATHETER Right 01-15-14   Right chest TDC placed by Dr. Augustin Coupe at [facility]  . LIGATION OF ARTERIOVENOUS  FISTULA Right 07/15/2014   Procedure: LIGATION OF ARTERIOVENOUS  FISTULA  (RIGHT RADIOCEPHALIC);  Surgeon: Elam Dutch, MD;  Location: [place];  Service: Vascular;  Laterality: Right;  . LIGATION OF  COMPETING BRANCHES OF ARTERIOVENOUS FISTULA Right 05/08/2014   Procedure: LIGATION OF COMPETING BRANCHES OF RIGHT ARM RADIOCEPHALIC ARTERIOVENOUS FISTULA;  Surgeon: Mal Misty, MD;  Location: [place];  Service: Vascular;  Laterality: Right;  . REVISON OF ARTERIOVENOUS FISTULA Right 07/15/2014   Procedure: EXPLORATION OF ARTERIOVENOUS FISTULA;  Surgeon: Elam Dutch, MD;  Location: [place];  Service: Vascular;  Laterality: Right;  . TEE WITHOUT CARDIOVERSION N/A 12/09/2014   Procedure: TRANSESOPHAGEAL ECHOCARDIOGRAM (TEE);  Surgeon: Gaye Pollack, MD;  Location: [place];  Service: Open Heart Surgery;  Laterality: N/A;   Family History  Family History   Problem Relation Age of Onset  . Diabetes Father   . Lung cancer Father 38       non-smoker  . Cancer Mother        multiple myeloma  . Colon cancer Neg Hx    Social History  reports that he has never smoked. He has never used smokeless tobacco. He reports that he drinks about 2.0 standard drinks of alcohol per week. He reports that he does not use drugs. Allergies No Known Allergies Home medications Prior to Admission medications   Medication Sig Start Date End Date Taking? Authorizing Provider  allopurinol (ZYLOPRIM) 100 MG tablet TAKE 1 TABLET BY MOUTH DAILY Patient taking differently: Take 100 mg by mouth daily.  06/08/17  Yes Reed, Tiffany L, DO  amLODipine (NORVASC) 10 MG tablet TAKE 1 TABLET BY MOUTH DAILY TO CONTROL BLOOD PRESSURE Patient taking differently: Take 10 mg by mouth daily.  06/08/17  Yes Reed, Tiffany L, DO  atorvastatin (LIPITOR) 20 MG tablet TAKE 1 TABLET BY MOUTH DAILY FOR CHOLESTEROL Patient taking differently: 20 mg every morning.  06/08/17  Yes Reed, Tiffany L, DO  calcium carbonate (TUMS - DOSED IN MG ELEMENTAL CALCIUM) 500 MG chewable tablet Chew 2 tablets by mouth daily as needed for indigestion.    Yes [provider]  cinacalcet (SENSIPAR) 60 MG tablet Take 60 mg by mouth every Tuesday, Thursday, and Saturday at 6 PM.    Yes [provider]  loratadine (CLARITIN) 10 MG tablet Take 10 mg daily by mouth.   Yes [provider]  metoprolol tartrate (LOPRESSOR) 25 MG tablet Take 0.5 tablets (12.5 mg total) by mouth 2 (two) times daily. Please make yearly appt with Truitt Merle, NP for February. 1st attempt 09/30/17  Yes Deboraha Sprang, MD  omeprazole (PRILOSEC) 20 MG capsule Take 20 mg by mouth daily.    Yes [provider]   Liver Function Tests Recent Labs  Lab 10/10/17 1149  AST 9*  ALT 12  ALKPHOS 55  BILITOT 0.7  PROT 7.5  ALBUMIN 3.6   No results for input(s): LIPASE, AMYLASE in the last 168 hours. CBC Recent Labs   Lab 10/10/17 1149  WBC 9.3  NEUTROABS 6.0  HGB 11.9*  HCT 35.9*  MCV 102.6*  PLT 383   Basic Metabolic Panel Recent Labs  Lab 10/10/17 1149  NA 131*  K 6.7*  CL 96*  CO2 19*  GLUCOSE 98  BUN 67*  CREATININE 9.58*  CALCIUM 9.7   Vitals:   10/10/17 1300 10/10/17 1330 10/10/17 1400 10/10/17 1430  BP: (!) 123/57 (!) 107/58 112/65 120/62  Pulse: (!) 59 62 62 63  Resp: 20 11 17 19   Temp:      TempSrc:      SpO2: 100% 98% 97% 100%  Weight:      Height:  Exam Gen NAD, WDWN male No rash, cyanosis or gangrene Sclera anicteric, throat clear   No jvd or bruits  Chest clear bilat  RRR no MRG  Abd soft ntnd no mass or ascites +bs  MS no joint effusions or deformity  Ext no edema, no wounds or ulcers  Neuro is alert, Ox 3 , nf     Home meds:  - allopurinol 100/ amlodipine 10/ metoprolol 12.5 bid  - cinacalcet 60 tiw/ atorvastatin 20 am/ omeprazole 20   Dialysis: [facility]  4h  400/800  78.5kg  2/3.5 Ca bath  P4  Hep none  - hect 2 ug  - venofer 50/wk       Impression: 1. Hyperkalemia - no sig EKG changes due to chronic LBBB, but will need HD tonight.  Orders written. Got kayexalate in ED 30 gm.   2. ESRD - usual HD TTS.  HD again tomorrow to get back on sched, short Rx.  3. Hypotension - not sure cause, doesn't appear septic May be gaining body wt, also consider cardiac.  4. CAD hx CABG/ tissue AVR - in 2016 5. Anemia ckd - Hb 11, no need for esa   Plan - Shortened HD today for 3hrs, keep even and again tomorrow to get back on schedule.   Jen Mow, PA-C [facility] Pager: [phone]   Kelly Splinter MD [facility] pager [phone]   10/10/2017, 3:22 PM

## 2017-10-10 NOTE — ED Provider Notes (Signed)
[place] EMERGENCY DEPARTMENT Provider Note   CSN: [id number] Arrival date & time: 10/10/17  1135     History   Chief Complaint Chief Complaint  Patient presents with  . Fatigue    HPI Eric [name] is a 84 y.o. male.  HPI 84 year old man history of end-stage renal disease, on dialysis Tuesday Thursday Saturday, presents from cardiology office where he was noted to be hypotensive.  He states that he has felt somewhat weak which is normal for him.  He has not noted any major increase in weakness.  He has been eating and drinking as usual and has gone to his dialysis as usual.  He has a history of cardiac disease and status post CABG.  He has not noted any new chest pain or dyspnea.  He reports that his blood pressures usually run somewhat low.  Report from the office is that his blood pressure was 58 palpably.  EMS reported his blood pressures being 75/40. Past Medical History:  Diagnosis Date  . Anxiety   . Arthritis   . CKD (chronic kidney disease) stage 5, GFR less than 15 ml/min (HCC)   . Complication of anesthesia    " one time my heart stopped due to a medication that I was on."   . GERD (gastroesophageal reflux disease)   . Gout   . Heart murmur    as a teen  . Hemodialysis patient [name]    Tues, Thursday, Saturday  . Herpes zoster 04/2012  . Hypertension   . Shortness of breath dyspnea    with exertion  . Unspecified essential hypertension     Patient Active Problem List   Diagnosis Date Noted  . Secondary hyperparathyroidism of renal origin ([place]) 05/11/2017  . A-V fistula ([place]) 11/10/2016  . Normocytic anemia 04/16/2016  . Heme positive stool 04/16/2016  . S/P AVR 12/09/2014  . Coronary artery disease involving native coronary artery of native heart with unstable angina pectoris ([place])   . Insomnia, unspecified 09/24/2013  . Pain in neck 09/24/2013  . Cough 06/04/2013  . Gout 06/04/2013  . ESRD on dialysis ([place]) 06/04/2013  . Essential  hypertension   . GERD (gastroesophageal reflux disease)   . Herpes zoster 06/04/2008    Past Surgical History:  Procedure Laterality Date  . AORTIC VALVE REPLACEMENT N/A 12/09/2014   Procedure: AORTIC VALVE REPLACEMENT (AVR) WITH 23MM MAGNA EASE BIOPROSTHETIC VALVE;  Surgeon: Gaye Pollack, MD;  Location: [place] OR;  Service: Open Heart Surgery;  Laterality: N/A;  . APPENDECTOMY  1944  . AV FISTULA PLACEMENT Left    Left Cimino AVF placed in [place]   . AV FISTULA PLACEMENT Right 03/06/2014   Procedure: ARTERIOVENOUS (AV) FISTULA CREATION-right radiocephalic;  Surgeon: Mal Misty, MD;  Location: [facility] OR;  Service: Vascular;  Laterality: Right;  . AV FISTULA PLACEMENT Right 07/15/2014   Procedure: ARTERIOVENOUS (AV) FISTULA CREATION;  Surgeon: Elam Dutch, MD;  Location: [place];  Service: Vascular;  Laterality: Right;  . CARDIAC CATHETERIZATION N/A 11/27/2014   Procedure: Right/Left Heart Cath and Coronary Angiography;  Surgeon: Burnell Blanks, MD;  Location: [place] CV LAB;  Service: Cardiovascular;  Laterality: N/A;  . CATARACT EXTRACTION W/ INTRAOCULAR LENS IMPLANT Bilateral 2014   [facility]  . COLONOSCOPY  2013   Dr. Annamaria Helling [place], [place].  . CORONARY ARTERY BYPASS GRAFT N/A 12/09/2014   Procedure: CORONARY ARTERY BYPASS GRAFTING (CABG), ON PUMP, TIMES THREE, USING LEFT INTERNAL MAMMARY ARTERY,  RIGHT GREATER SAPHENOUS VEIN HARVESTED ENDOSCOPICALLY;  Surgeon: Gaye Pollack, MD;  Location: [facility];  Service: Open Heart Surgery;  Laterality: N/A;  LIMA-LAD; SVG-OM; SVG-PD  . DIALYSIS FISTULA CREATION  08/04/2012 and 10/13   Dr. Harden Mo  . EYE SURGERY     Cataract  . INSERTION OF DIALYSIS CATHETER Right 01-15-14   Right chest TDC placed by Dr. Augustin Coupe at [facility]  . LIGATION OF ARTERIOVENOUS  FISTULA Right 07/15/2014   Procedure: LIGATION OF ARTERIOVENOUS  FISTULA  (RIGHT RADIOCEPHALIC);  Surgeon: Elam Dutch, MD;  Location: [place];  Service: Vascular;   Laterality: Right;  . LIGATION OF COMPETING BRANCHES OF ARTERIOVENOUS FISTULA Right 05/08/2014   Procedure: LIGATION OF COMPETING BRANCHES OF RIGHT ARM RADIOCEPHALIC ARTERIOVENOUS FISTULA;  Surgeon: Mal Misty, MD;  Location: [place];  Service: Vascular;  Laterality: Right;  . REVISON OF ARTERIOVENOUS FISTULA Right 07/15/2014   Procedure: EXPLORATION OF ARTERIOVENOUS FISTULA;  Surgeon: Elam Dutch, MD;  Location: [place];  Service: Vascular;  Laterality: Right;  . TEE WITHOUT CARDIOVERSION N/A 12/09/2014   Procedure: TRANSESOPHAGEAL ECHOCARDIOGRAM (TEE);  Surgeon: Gaye Pollack, MD;  Location: [place];  Service: Open Heart Surgery;  Laterality: N/A;        Home Medications    Prior to Admission medications   Medication Sig Start Date End Date Taking? Authorizing Provider  allopurinol (ZYLOPRIM) 100 MG tablet TAKE 1 TABLET BY MOUTH DAILY Patient taking differently: Take 100 mg by mouth daily.  06/08/17  Yes Reed, Tiffany L, DO  amLODipine (NORVASC) 10 MG tablet TAKE 1 TABLET BY MOUTH DAILY TO CONTROL BLOOD PRESSURE Patient taking differently: Take 10 mg by mouth daily.  06/08/17  Yes Reed, Tiffany L, DO  atorvastatin (LIPITOR) 20 MG tablet TAKE 1 TABLET BY MOUTH DAILY FOR CHOLESTEROL Patient taking differently: 20 mg every morning.  06/08/17  Yes Reed, Tiffany L, DO  calcium carbonate (TUMS - DOSED IN MG ELEMENTAL CALCIUM) 500 MG chewable tablet Chew 2 tablets by mouth daily as needed for indigestion.    Yes [provider]  cinacalcet (SENSIPAR) 60 MG tablet Take 60 mg by mouth every Tuesday, Thursday, and Saturday at 6 PM.    Yes [provider]  loratadine (CLARITIN) 10 MG tablet Take 10 mg daily by mouth.   Yes [provider]  metoprolol tartrate (LOPRESSOR) 25 MG tablet Take 0.5 tablets (12.5 mg total) by mouth 2 (two) times daily. Please make yearly appt with Truitt Merle, NP for February. 1st attempt 09/30/17  Yes Deboraha Sprang, MD  omeprazole (PRILOSEC) 20 MG  capsule Take 20 mg by mouth daily.    Yes [provider]    Family History Family History  Problem Relation Age of Onset  . Diabetes Father   . Lung cancer Father 12       non-smoker  . Cancer Mother        multiple myeloma  . Colon cancer Neg Hx     Social History Social History   Tobacco Use  . Smoking status: Never Smoker  . Smokeless tobacco: Never Used  Substance Use Topics  . Alcohol use: Yes    Alcohol/week: 2.0 standard drinks    Types: 1 Glasses of wine, 1 Shots of liquor per week    Comment: one glass a day of either wine or liquor  . Drug use: No     Allergies   Patient has no known allergies.   Review of Systems  Review of Systems  Respiratory: Positive for cough.   All other systems reviewed and are negative.    Physical Exam Updated Vital Signs BP (!) 107/58   Pulse 62   Temp 97.6 F (36.4 C) (Rectal)   Resp 11   Ht 1.753 m (5' 9" )   Wt 80.7 kg   SpO2 98%   BMI 26.26 kg/m   Physical Exam  Constitutional: He is oriented to person, place, and time. He appears well-developed and well-nourished.  HENT:  Head: Normocephalic and atraumatic.  Eyes: Pupils are equal, round, and reactive to light. EOM are normal.  Neck: Normal range of motion. Neck supple.  Cardiovascular: Normal rate and regular rhythm.  Pulmonary/Chest: Effort normal.  Abdominal: Soft.  Musculoskeletal: Normal range of motion.  Neurological: He is alert and oriented to person, place, and time.  Skin: Skin is warm and dry. Capillary refill takes less than 2 seconds.  Psychiatric: He has a normal mood and affect.  Nursing note and vitals reviewed.    ED Treatments / Results  Labs (all labs ordered are listed, but only abnormal results are displayed) Labs Reviewed  CBC WITH DIFFERENTIAL/PLATELET - Abnormal; Notable for the following components:      Result Value   RBC 3.50 (*)    Hemoglobin 11.9 (*)    HCT 35.9 (*)    MCV 102.6 (*)    Monocytes Absolute 1.1  (*)    All other components within normal limits  CULTURE, BLOOD (ROUTINE X 2)  CULTURE, BLOOD (ROUTINE X 2)  COMPREHENSIVE METABOLIC PANEL  URINALYSIS, ROUTINE W REFLEX MICROSCOPIC  CBG MONITORING, ED  I-STAT TROPONIN, ED  I-STAT CG4 LACTIC ACID, ED  I-STAT CG4 LACTIC ACID, ED    EKG EKG Interpretation  Date/Time:  Monday October 10 2017 11:51:43 EDT Ventricular Rate:  62 PR Interval:    QRS Duration: 181 QT Interval:  481 QTC Calculation: 489 R Axis:   -103 Text Interpretation:  Normal sinus rhythm with prolonged AV conduction Left bundle branch block Confirmed by Pattricia Boss [phone]) on 10/10/2017 11:59:09 AM   Radiology Dg Chest Port 1 View  Result Date: 10/10/2017 CLINICAL DATA:  Hypotension, exertional shortness of breath. Dialysis dependent renal failure. Previous CABG EXAM: PORTABLE CHEST 1 VIEW COMPARISON:  PA and lateral chest x-Yolani Vo of January 15, 2015 FINDINGS: The lungs are well-expanded and clear. The cardiac silhouette is enlarged. The pulmonary vascularity is not engorged. There is no pulmonary edema or pleural effusion. The sternal wires are intact. There is calcification in the wall of the aortic arch. The observed bony thorax is unremarkable. IMPRESSION: Mild cardiomegaly. No pulmonary edema, pneumonia, nor other acute cardiopulmonary abnormality. Thoracic aortic atherosclerosis. Electronically Signed   By: David  Martinique M.D.   On: 10/10/2017 12:13    Procedures .Critical Care Performed by: Pattricia Boss, MD Authorized by: Pattricia Boss, MD   Critical care provider statement:    Critical care time (minutes):  45   Critical care end time:  10/10/2017 3:00 PM   Critical care was necessary to treat or prevent imminent or life-threatening deterioration of the following conditions:  Circulatory failure and renal failure   Critical care was time spent personally by me on the following activities:  Discussions with consultants, evaluation of patient's response to  treatment, examination of patient, ordering and performing treatments and interventions, ordering and review of laboratory studies, ordering and review of radiographic studies, pulse oximetry, re-evaluation of patient's condition, obtaining history from patient or surrogate and review  of old charts   (including critical care time)  Medications Ordered in ED Medications - No data to display   Initial Impression / Assessment and Plan / ED Course  I have reviewed the triage vital signs and the nursing notes.  Pertinent labs & imaging results that were available during my care of the patient were reviewed by me and considered in my medical decision making (see chart for details).   84 yo male esrd on dialysis presents today with hypotension. Patient with minimal symptoms However, new lbbb on ekg although no chest pain Potassium elevated at 6.6, plan some fluids although cautious to avoid volume overload. Kayexalate Admit for further treatment and evaluation.  1- hypotension- resolved 2- new lbbb 3- hyperkalemia With Dr. Lorin Mercy and she will see for hospitalist Discussed with Dr. Jonnie Finner and he plans short dialysis today due to hyperkalemia with regular long dialysis tomorrow Discussed plan with patient, wife, and daughter at bedside. Final Clinical Impressions(s) / ED Diagnoses   Final diagnoses:  Hypotension, unspecified hypotension type  Abnormal EKG  Hyperkalemia    ED Discharge Orders    None       Pattricia Boss, MD 10/10/17 1501

## 2017-10-10 NOTE — H&P (Signed)
History and Physical    Eric [name] JKD:[id number] DOB: 10-Jun-1930 DOA: 10/10/2017  PCP: Gayland Curry, DO Consultants:  [facility] - nephrology; Servando Snare - cardiology PA Patient coming from: [facility] - lives with wife; Donald Prose: Daughter, [phone]; [phone]  Chief Complaint:  Sent from cardiology office  HPI: Eric [name] is a 84 y.o. male with medical history significant of HTN; ESRD on TTS HD; and CAD s/p CABG presenting with hypotension (75/40 by EMS), sent by his cardiology office.  He had a routine checkup today with cardiology and was found to have a low BP.  He gets tired very quickly, which has been going on for a while and progressing.  He now feels tired when he travels from the bedroom to the kitchen.  This was the same symptom he had prior to his CABG.  No chest pain then or now.  The fatigue has been ongoing for months.  He reported it to his daughter over the last 2 months.  Occasionally his BP is a bit low after HD and they have to hold him.  But the last 2 weeks it has not been low.     ED Course:  Seen at cards today for f/u, with c/o mild weakness.  BP 58 by palp, EMS got 75/40, now about 379 systolic.  No infection. EKG with new LBBB, no chest pain, normal trop.  TTS HD patient, K+ 6.7.  Just needs obs on a monitor overnight and she will need notify cardiology and nephrology of plan.  Review of Systems: As per HPI; otherwise review of systems reviewed and negative.   Ambulatory Status:  Ambulates without assistance  Past Medical History:  Diagnosis Date  . Anxiety   . Arthritis   . Complication of anesthesia    " one time my heart stopped due to a medication that I was on."   . ESRD (end stage renal disease) on dialysis ([place])    Tues, Thursday, Saturday  . GERD (gastroesophageal reflux disease)   . Gout   . Heart murmur    as a teen  . Herpes zoster 04/2012  . Hypertension   . Shortness of breath dyspnea    with exertion    Past Surgical  History:  Procedure Laterality Date  . AORTIC VALVE REPLACEMENT N/A 12/09/2014   Procedure: AORTIC VALVE REPLACEMENT (AVR) WITH 23MM MAGNA EASE BIOPROSTHETIC VALVE;  Surgeon: Gaye Pollack, MD;  Location: [facility];  Service: Open Heart Surgery;  Laterality: N/A;  . APPENDECTOMY  1944  . AV FISTULA PLACEMENT Left    Left Cimino AVF placed in [place]   . AV FISTULA PLACEMENT Right 03/06/2014   Procedure: ARTERIOVENOUS (AV) FISTULA CREATION-right radiocephalic;  Surgeon: Mal Misty, MD;  Location: [facility];  Service: Vascular;  Laterality: Right;  . AV FISTULA PLACEMENT Right 07/15/2014   Procedure: ARTERIOVENOUS (AV) FISTULA CREATION;  Surgeon: Elam Dutch, MD;  Location: [facility];  Service: Vascular;  Laterality: Right;  . CARDIAC CATHETERIZATION N/A 11/27/2014   Procedure: Right/Left Heart Cath and Coronary Angiography;  Surgeon: Burnell Blanks, MD;  Location: [facility];  Service: Cardiovascular;  Laterality: N/A;  . CATARACT EXTRACTION W/ INTRAOCULAR LENS IMPLANT Bilateral 2014   [facility]  . COLONOSCOPY  2013   Dr. Annamaria Helling [place], [place].  . CORONARY ARTERY BYPASS GRAFT N/A 12/09/2014   Procedure: CORONARY ARTERY BYPASS GRAFTING (CABG), ON PUMP, TIMES THREE, USING LEFT INTERNAL MAMMARY ARTERY, RIGHT GREATER SAPHENOUS VEIN HARVESTED ENDOSCOPICALLY;  Surgeon: Gaye Pollack, MD;  Location: [place];  Service: Open Heart Surgery;  Laterality: N/A;  LIMA-LAD; SVG-OM; SVG-PD  . DIALYSIS FISTULA CREATION  08/04/2012 and 10/13   Dr. Harden Mo  . EYE SURGERY     Cataract  . INSERTION OF DIALYSIS CATHETER Right 01-15-14   Right chest TDC placed by Dr. Augustin Coupe at [facility]  . LIGATION OF ARTERIOVENOUS  FISTULA Right 07/15/2014   Procedure: LIGATION OF ARTERIOVENOUS  FISTULA  (RIGHT RADIOCEPHALIC);  Surgeon: Elam Dutch, MD;  Location: [facility];  Service: Vascular;  Laterality: Right;  . LIGATION OF COMPETING BRANCHES OF ARTERIOVENOUS FISTULA Right 05/08/2014   Procedure:  LIGATION OF COMPETING BRANCHES OF RIGHT ARM RADIOCEPHALIC ARTERIOVENOUS FISTULA;  Surgeon: Mal Misty, MD;  Location: [facility];  Service: Vascular;  Laterality: Right;  . REVISON OF ARTERIOVENOUS FISTULA Right 07/15/2014   Procedure: EXPLORATION OF ARTERIOVENOUS FISTULA;  Surgeon: Elam Dutch, MD;  Location: [facility];  Service: Vascular;  Laterality: Right;  . TEE WITHOUT CARDIOVERSION N/A 12/09/2014   Procedure: TRANSESOPHAGEAL ECHOCARDIOGRAM (TEE);  Surgeon: Gaye Pollack, MD;  Location: [facility];  Service: Open Heart Surgery;  Laterality: N/A;    Social History   Socioeconomic History  . Marital status: Married    Spouse name: Jan  . Number of children: 3  . Years of education: 14  . Highest education level: Not on file  Occupational History  . Occupation: retired Manufacturing engineer  . Financial resource strain: Not hard at all  . Food insecurity:    Worry: Never true    Inability: Never true  . Transportation needs:    Medical: No    Non-medical: No  Tobacco Use  . Smoking status: Never Smoker  . Smokeless tobacco: Never Used  Substance and Sexual Activity  . Alcohol use: Yes    Alcohol/week: 2.0 standard drinks    Types: 1 Glasses of wine, 1 Shots of liquor per week    Comment: one glass a day of either wine or liquor  . Drug use: No  . Sexual activity: Not on file  Lifestyle  . Physical activity:    Days per week: 3 days    Minutes per session: 40 min  . Stress: Only a little  Relationships  . Social connections:    Talks on phone: More than three times a week    Gets together: More than three times a week    Attends religious service: Never    Active member of club or organization: No    Attends meetings of clubs or organizations: Never    Relationship status: Married  . Intimate partner violence:    Fear of current or ex partner: No    Emotionally abused: No    Physically abused: No    Forced sexual activity: No  Other Topics Concern  . Not on  file  Social History Narrative   Patient is Married since 1958. Occupation: Licensed conveyancer   Lives in apartment,  Independent Living  section at [place] since 05/04/2013. Also has a home in [place], [place].   No Smoking history  Alcohol history: 5 drinks/ week   Regular exercise: 3-4 times a week , treadmill   Patient has Advanced planning documents: Living Will, HCPOA          No Known Allergies  Family History  Problem Relation Age of Onset  . Diabetes Father   . Lung cancer Father 12  non-smoker  . Cancer Mother        multiple myeloma  . Colon cancer Neg Hx     Prior to Admission medications   Medication Sig Start Date End Date Taking? Authorizing Provider  allopurinol (ZYLOPRIM) 100 MG tablet TAKE 1 TABLET BY MOUTH DAILY Patient taking differently: Take 100 mg by mouth daily.  06/08/17  Yes Reed, Tiffany L, DO  amLODipine (NORVASC) 10 MG tablet TAKE 1 TABLET BY MOUTH DAILY TO CONTROL BLOOD PRESSURE Patient taking differently: Take 10 mg by mouth daily.  06/08/17  Yes Reed, Tiffany L, DO  atorvastatin (LIPITOR) 20 MG tablet TAKE 1 TABLET BY MOUTH DAILY FOR CHOLESTEROL Patient taking differently: 20 mg every morning.  06/08/17  Yes Reed, Tiffany L, DO  calcium carbonate (TUMS - DOSED IN MG ELEMENTAL CALCIUM) 500 MG chewable tablet Chew 2 tablets by mouth daily as needed for indigestion.    Yes [provider]  cinacalcet (SENSIPAR) 60 MG tablet Take 60 mg by mouth every Tuesday, Thursday, and Saturday at 6 PM.    Yes [provider]  loratadine (CLARITIN) 10 MG tablet Take 10 mg daily by mouth.   Yes [provider]  metoprolol tartrate (LOPRESSOR) 25 MG tablet Take 0.5 tablets (12.5 mg total) by mouth 2 (two) times daily. Please make yearly appt with Truitt Merle, NP for February. 1st attempt 09/30/17  Yes Deboraha Sprang, MD  omeprazole (PRILOSEC) 20 MG capsule Take 20 mg by mouth daily.    Yes [provider]     Physical Exam: Vitals:   10/10/17 1630 10/10/17 1700 10/10/17 1730 10/10/17 1800  BP: (!) 109/56 (!) 103/54 (!) 100/49 118/60  Pulse: 65 65 72 73  Resp:      Temp:      TempSrc:      SpO2:      Weight:      Height:         General:  Appears calm and comfortable and is NAD Eyes:  PERRL, EOMI, normal lids, iris ENT:  grossly normal hearing, lips & tongue, mmm; appropriate dentition Neck:  no LAD, masses or thyromegaly; no carotid bruits Cardiovascular:  RRR, no m/r/g. No LE edema.  Respiratory:   CTA bilaterally with no wheezes/rales/rhonchi.  Normal respiratory effort. Abdomen:  soft, NT, ND, NABS Back:   normal alignment, no CVAT Skin:  no rash or induration seen on limited exam Musculoskeletal:  grossly normal tone BUE/BLE, good ROM, no bony abnormality Lower extremity:  No LE edema.  Limited foot exam with no ulcerations.  2+ distal pulses. Psychiatric:  grossly normal mood and affect, speech fluent and appropriate, AOx3 Neurologic:  CN 2-12 grossly intact, moves all extremities in coordinated fashion, sensation intact    Radiological Exams on Admission: Dg Chest Port 1 View  Result Date: 10/10/2017 CLINICAL DATA:  Hypotension, exertional shortness of breath. Dialysis dependent renal failure. Previous CABG EXAM: PORTABLE CHEST 1 VIEW COMPARISON:  PA and lateral chest x-ray of January 15, 2015 FINDINGS: The lungs are well-expanded and clear. The cardiac silhouette is enlarged. The pulmonary vascularity is not engorged. There is no pulmonary edema or pleural effusion. The sternal wires are intact. There is calcification in the wall of the aortic arch. The observed bony thorax is unremarkable. IMPRESSION: Mild cardiomegaly. No pulmonary edema, pneumonia, nor other acute cardiopulmonary abnormality. Thoracic aortic atherosclerosis. Electronically Signed   By: David  Martinique M.D.   On: 10/10/2017 12:13    EKG: Independently reviewed.  NSR with  rate 62; LBBB   Labs on  Admission: I have personally reviewed the available labs and imaging studies at the time of the admission.  Pertinent labs:   Na++ 131 K+ 6.7 CO2 19 BUN 67/Creatinine 9.58/GFR 5 Anion gap 16 Troponin 0.03 Lactate 1.30 WBC 9.3 Hgb 11.9 - stable Lipids: 125/37/72/80 on 02/07/17  Assessment/Plan Principal Problem:   Hypotension Active Problems:   Essential hypertension   ESRD on dialysis (HCC)   Hyperkalemia   Hypotension, with long-standing HTN -Patient with chronic and progressive fatigue with exertion which is similar to his prior index symptoms with CAD - his cardiac issue will need to be evaluated either as inpatient or as outpatient -Meanwhile, at the cardiology office, he was found to have profound hypotension -This may be medication-related; associated with HD volume status (needs a higher EDW); or could potentially be associated with hyperkalemia (less likely) -His hypotension has improved without intervention -Will observe overnight on telemetry -If BP has stabilized and other issues are also settled, he may be appropriate for d/c to home tomorrow -Will hold Norvasc and consider d/c if BP remains low -Continue Lopressor if BP is able to tolerate  Hyperkalemia -Likely due to CKD -For HD today to help alleviate this issue, as it could be contributing to his hypotension  ESRD, on HD -Patient on chronic TTS HD -Nephrology prn order set utilized -He does not appear to be volume overloaded  -However, with his hyperkalemia, nephrology has been asked to see the patient to see if acute HD is indicated -Plan is for a short HD session tonight and then a full HD session tomorrow -He may need to have a slightly greater EDW; will defer to nephrology  CAD s/p CABG -Patient with recurrent symptoms similar to his prior index symptoms with CAD -He was seeing cardiology today about this, but his BP was too low to assess -He may need further ischemic testing, but it is unlikely to be  done in the setting of symptomatic hypotension -Cardiology consultation has been requested  -Troponin negative x 1  DVT prophylaxis:  Lovenox  Code Status:  DNR - confirmed with patient/family Family Communication: Wife (probable dementia, needs repetition of the message repeatedly) and daughter were present throughout evaluation  Disposition Plan:  Home once clinically improved Consults called: Cardiology; Nephrology  Admission status: It is my clinical opinion that referral for OBSERVATION is reasonable and necessary in this patient based on the above information provided. The aforementioned taken together are felt to place the patient at high risk for further clinical deterioration. However it is anticipated that the patient may be medically stable for discharge from the hospital within 24 to 48 hours.    Karmen Bongo MD [facility]  If note is complete, please contact covering daytime or nighttime physician. [email or web address] Password [facility]  10/10/2017, 6:14 PM

## 2017-10-11 DIAGNOSIS — D631 Anemia in chronic kidney disease: Secondary | ICD-10-CM | POA: Diagnosis not present

## 2017-10-11 DIAGNOSIS — Z992 Dependence on renal dialysis: Secondary | ICD-10-CM | POA: Diagnosis not present

## 2017-10-11 DIAGNOSIS — I953 Hypotension of hemodialysis: Secondary | ICD-10-CM | POA: Diagnosis not present

## 2017-10-11 DIAGNOSIS — E875 Hyperkalemia: Secondary | ICD-10-CM

## 2017-10-11 DIAGNOSIS — R9431 Abnormal electrocardiogram [ECG] [EKG]: Secondary | ICD-10-CM | POA: Diagnosis not present

## 2017-10-11 DIAGNOSIS — N186 End stage renal disease: Secondary | ICD-10-CM | POA: Diagnosis not present

## 2017-10-11 DIAGNOSIS — E871 Hypo-osmolality and hyponatremia: Secondary | ICD-10-CM | POA: Diagnosis present

## 2017-10-11 DIAGNOSIS — I959 Hypotension, unspecified: Secondary | ICD-10-CM | POA: Diagnosis not present

## 2017-10-11 LAB — BASIC METABOLIC PANEL
ANION GAP: 11 (ref 5–15)
BUN: 32 mg/dL — ABNORMAL HIGH (ref 8–23)
CALCIUM: 8.7 mg/dL — AB (ref 8.9–10.3)
CO2: 27 mmol/L (ref 22–32)
Chloride: 98 mmol/L (ref 98–111)
Creatinine, Ser: 6.21 mg/dL — ABNORMAL HIGH (ref 0.61–1.24)
GFR calc non Af Amer: 7 mL/min — ABNORMAL LOW (ref >60)
GFR, EST AFRICAN AMERICAN: 8 mL/min — AB (ref >60)
Glucose, Bld: 94 mg/dL (ref 70–99)
Potassium: 4 mmol/L (ref 3.5–5.1)
SODIUM: 136 mmol/L (ref 135–145)

## 2017-10-11 LAB — CBC
HCT: 31.1 % — ABNORMAL LOW (ref 39.0–52.0)
Hemoglobin: 10.4 g/dL — ABNORMAL LOW (ref 13.0–17.0)
MCH: 33.7 pg (ref 26.0–34.0)
MCHC: 33.4 g/dL (ref 30.0–36.0)
MCV: 100.6 fL — ABNORMAL HIGH (ref 80.0–100.0)
Platelets: 145 10*3/uL — ABNORMAL LOW (ref 150–400)
RBC: 3.09 MIL/uL — AB (ref 4.22–5.81)
RDW: 14.1 % (ref 11.5–15.5)
WBC: 7.2 10*3/uL (ref 4.0–10.5)

## 2017-10-11 LAB — MRSA PCR SCREENING: MRSA BY PCR: NEGATIVE

## 2017-10-11 NOTE — Progress Notes (Deleted)
Post infusion temp 100.1. Hal Hope, MD paged. Per MD, benadryl and tylenol and hold blood for 1 hour. Will continue to monitor

## 2017-10-11 NOTE — Progress Notes (Signed)
I was asked by Dr. Harrington Challenger to schedule f/u appt. Done 10/29 at 9:30 am with Cecille Rubin, NP. I spoke with nurse since AVS already printed and she wrote down on AVS and will relay to pt. Alexandro Line PA-C

## 2017-10-11 NOTE — Progress Notes (Signed)
Pt transported to dialysis

## 2017-10-11 NOTE — Progress Notes (Addendum)
[facility] Progress Note   Subjective:   Seen in HD. Feeling well.  Got out of bed and walked around without difficulty yesterday.  Ready to go home.   Objective Vitals:   10/11/17 0830 10/11/17 0900 10/11/17 0930 10/11/17 1000  BP: (!) 87/32 (!) 83/31 (!) 88/55 (!) 103/46  Pulse: (!) 51 68 68 68  Resp:      Temp:      TempSrc:      SpO2:      Weight:      Height:       Physical Exam General:NAD, WDWN pleasant male sitting in bed Heart:RRR Lungs:nml WOB, CTAB Abdomen:soft, NTND Extremities:no edema Dialysis Access: RU AVF cannulated   Filed Weights   10/10/17 1143 10/10/17 2011 10/11/17 0740  Weight: 80.7 kg 78.2 kg 78.4 kg    Intake/Output Summary (Last 24 hours) at 10/11/2017 1028 Last data filed at 10/10/2017 1925 Gross per 24 hour  Intake 250 ml  Output 0 ml  Net 250 ml    Additional Objective Labs: Basic Metabolic Panel: Recent Labs  Lab 10/10/17 1149 10/11/17 0530  NA 131* 136  K 6.7* 4.0  CL 96* 98  CO2 19* 27  GLUCOSE 98 94  BUN 67* 32*  CREATININE 9.58* 6.21*  CALCIUM 9.7 8.7*   Liver Function Tests: Recent Labs  Lab 10/10/17 1149  AST 9*  ALT 12  ALKPHOS 55  BILITOT 0.7  PROT 7.5  ALBUMIN 3.6   CBC: Recent Labs  Lab 10/10/17 1149 10/11/17 0530  WBC 9.3 7.2  NEUTROABS 6.0  --   HGB 11.9* 10.4*  HCT 35.9* 31.1*  MCV 102.6* 100.6*  PLT 182 145*   CBG: Recent Labs  Lab 10/10/17 1300  GLUCAP 94    Lab Results  Component Value Date   INR 1.42 12/09/2014   INR 1.10 12/06/2014   INR 1.00 11/25/2014   Studies/Results: Dg Chest Port 1 View  Result Date: 10/10/2017 CLINICAL DATA:  Hypotension, exertional shortness of breath. Dialysis dependent renal failure. Previous CABG EXAM: PORTABLE CHEST 1 VIEW COMPARISON:  PA and lateral chest x-ray of January 15, 2015 FINDINGS: The lungs are well-expanded and clear. The cardiac silhouette is enlarged. The pulmonary vascularity is not engorged. There is no pulmonary  edema or pleural effusion. The sternal wires are intact. There is calcification in the wall of the aortic arch. The observed bony thorax is unremarkable. IMPRESSION: Mild cardiomegaly. No pulmonary edema, pneumonia, nor other acute cardiopulmonary abnormality. Thoracic aortic atherosclerosis. Electronically Signed   By: David  Martinique M.D.   On: 10/10/2017 12:13    Medications: . sodium chloride     . allopurinol  100 mg Oral Daily  . atorvastatin  20 mg Oral BH-q7a  . Chlorhexidine Gluconate Cloth  6 each Topical Q0600  . Chlorhexidine Gluconate Cloth  6 each Topical Q0600  . cinacalcet  60 mg Oral Q T,Th,Sat-1800  . docusate sodium  100 mg Oral BID  . enoxaparin (LOVENOX) injection  30 mg Subcutaneous Q24H  . metoprolol tartrate  12.5 mg Oral BID  . pantoprazole  40 mg Oral Daily  . sodium chloride flush  3 mL Intravenous Q12H    Dialysis Orders: TTS [facility]  4h  400/800  78.5kg  2/3.5 Ca bath  P4  Hep none  - hect 2 ug  - venofer 50/wk    Assessment/Plan: 1. Hyperkalemia - resolved post HD.  2. Hypotension - Amlodipine d/c. Possible weight gain, BP have been dropping  with HD as OP, will increase EDW at d/c by 1kg 3. ESRD - HD today per regular schedule. K 4.0 4. Anemia of CKD- Hgb 10.4. Continue to follow. 5. Secondary hyperparathyroidism -  6. Volume - At edw, BP remains low, increase edw. 7. Nutrition - Renal diet with fluid restrictions 8. CAD hx CABG/tissue AVR in 2016 9. Dispo: Stable to d/c from renal standpoint  Jen Mow, PA-C [facility] Pager: [phone] 10/11/2017,10:28 AM  LOS: 0 days   Pt seen, examined and agree w A/P as above.  Kelly Splinter MD [employer] pager [phone]   10/11/2017, 10:58 AM

## 2017-10-11 NOTE — Progress Notes (Signed)
Pt refused paperwork and stated he has all that in order where he lives and here when he had a procedure.

## 2017-10-11 NOTE — Progress Notes (Signed)
Pt provided AD, Living Will, HCPOA paperwork to pt.

## 2017-10-11 NOTE — Discharge Summary (Signed)
Physician Discharge Summary  Dat Eric [name] XBD:[id number] DOB: [birth year] DOA: 10/10/2017  PCP: Gayland Curry, DO  Admit date: 10/10/2017 Discharge date: 10/11/2017  Time spent: 60 minutes  Recommendations for Outpatient Follow-up:  1. Dr Harrington Challenger' office will contact for follow up appointment 2. Follow HD schedule as usual   Discharge Diagnoses:  Principal Problem:   Hypotension Active Problems:   ESRD on dialysis ([place])   Hyperkalemia   Essential hypertension   S/P CABG (coronary artery bypass graft)   Hyponatremia   Abnormal EKG   Discharge Condition: stable  Diet recommendation: heart healthy renal  Filed Weights   10/10/17 2011 10/11/17 0740 10/11/17 1028  Weight: 78.2 kg 78.4 kg 78.7 kg    History of present illness:  Eric [name] is a 84 y.o. male with medical history significant of HTN; ESRD on TTS HD; and CAD s/p CABG presented to ED on 10/7 from cardiology office with hypotension (75/40 by EMS).  He had a routine checkup  with cardiology and was found to have a low BP.  He reported getting tired very quickly, which has been going on for a while and progressing.  He felt tired when he travels from the bedroom to the kitchen.  This was the same symptom he had prior to his CABG.  No chest pain then or now.  The fatigue had been ongoing for months.  He reported it to his daughter over the last 2 months.  Occasionally his BP is a bit low after HD and they have to hold him.  But the last 2 weeks it has not been low.    Seen at cards day of admission for f/u, with c/o mild weakness.  BP 58 by palp, EMS got 75/40, at time of admission about 834 systolic.  No infection. EKG with new LBBB, no chest pain, normal trop.  TTS HD patient, K+ 6.7.  Just needs obs on a monitor overnight and  will need notify cardiology and nephrology of plan.  Hospital Course:  Hypotension, with long-standing HTN -Patient with chronic and progressive fatigue with exertion which is similar to his prior  index symptoms with CAD  -Meanwhile, at the cardiology office, he was found to have profound hypotension -This may be medication-related; associated with HD volume status (needs a higher EDW) -His hypotension has improved without intervention -no events on tele -evaluated by cardiology who opine LBBB not new but seen 2016. Recommended discontinuation of amlodipine and close monitoring of BP -evaluated by nephrology for HD and noted plan to increase EDW at discharge by 1kg -Continue Lopressor if BP is able to tolerate  Hyperkalemia -Likely due to CKD -resolved at discharge after HD  ESRD, on HD -evaluated by nephrology and under went HD on day of discharge.   CAD s/p CABG -Patient with recurrent symptoms similar to his prior index symptoms with CAD -evaluated by Dr Harrington Challenger cardiology who opined may be discharged and office will call for follow up. Troponin negative x 1 ekg with LBBB that present since 2016.    Procedures:    Consultations:  Dr Harrington Challenger cardiology  Dr Jonnie Finner nephrology  Discharge Exam: Vitals:   10/11/17 1028 10/11/17 1150  BP: (!) 103/55 (!) 114/54  Pulse: 70 69  Resp: 16 16  Temp: (!) 97.5 F (36.4 C) 97.6 F (36.4 C)  SpO2: 96% 95%    General: well nourished in no acute distress Cardiovascular: RRR no MGR no LE edema Respiratory: normal effort BS clear bilaterally  Discharge Instructions  Discharge Instructions    Call MD for:  difficulty breathing, headache or visual disturbances   Complete by:  As directed    Call MD for:  extreme fatigue   Complete by:  As directed    Call MD for:  persistant dizziness or light-headedness   Complete by:  As directed    Call MD for:  persistant nausea and vomiting   Complete by:  As directed    Diet - low sodium heart healthy   Complete by:  As directed    Discharge instructions   Complete by:  As directed    Dr Harrington Challenger' office will contact you for follow up appointment   Increase activity slowly    Complete by:  As directed      Allergies as of 10/11/2017   No Known Allergies     Medication List    STOP taking these medications   amLODipine 10 MG tablet Commonly known as:  NORVASC     TAKE these medications   allopurinol 100 MG tablet Commonly known as:  ZYLOPRIM TAKE 1 TABLET BY MOUTH DAILY   atorvastatin 20 MG tablet Commonly known as:  LIPITOR TAKE 1 TABLET BY MOUTH DAILY FOR CHOLESTEROL What changed:  See the new instructions.   calcium carbonate 500 MG chewable tablet Commonly known as:  TUMS - dosed in mg elemental calcium Chew 2 tablets by mouth daily as needed for indigestion.   cinacalcet 60 MG tablet Commonly known as:  SENSIPAR Take 60 mg by mouth every Tuesday, Thursday, and Saturday at 6 PM.   loratadine 10 MG tablet Commonly known as:  CLARITIN Take 10 mg daily by mouth.   metoprolol tartrate 25 MG tablet Commonly known as:  LOPRESSOR Take 0.5 tablets (12.5 mg total) by mouth 2 (two) times daily. Please make yearly appt with Truitt Merle, NP for February. 1st attempt   omeprazole 20 MG capsule Commonly known as:  PRILOSEC Take 20 mg by mouth daily.      No Known Allergies    The results of significant diagnostics from this hospitalization (including imaging, microbiology, ancillary and laboratory) are listed below for reference.    Significant Diagnostic Studies: Dg Chest Port 1 View  Result Date: 10/10/2017 CLINICAL DATA:  Hypotension, exertional shortness of breath. Dialysis dependent renal failure. Previous CABG EXAM: PORTABLE CHEST 1 VIEW COMPARISON:  PA and lateral chest x-ray of January 15, 2015 FINDINGS: The lungs are well-expanded and clear. The cardiac silhouette is enlarged. The pulmonary vascularity is not engorged. There is no pulmonary edema or pleural effusion. The sternal wires are intact. There is calcification in the wall of the aortic arch. The observed bony thorax is unremarkable. IMPRESSION: Mild cardiomegaly. No  pulmonary edema, pneumonia, nor other acute cardiopulmonary abnormality. Thoracic aortic atherosclerosis. Electronically Signed   By: David  Martinique M.D.   On: 10/10/2017 12:13    Microbiology: Recent Results (from the past 240 hour(s))  Blood culture (routine x 2)     Status: None (Preliminary result)   Collection Time: 10/10/17  1:11 PM  Result Value Ref Range Status   Specimen Description BLOOD LEFT ANTECUBITAL  Final   Special Requests   Final    BOTTLES DRAWN AEROBIC AND ANAEROBIC Blood Culture adequate volume   Culture   Final    NO GROWTH < 24 HOURS Performed at [facility], [address] [place], [place] [address]    Report Status PENDING  Incomplete  Blood culture (routine x 2)  Status: None (Preliminary result)   Collection Time: 10/10/17  1:45 PM  Result Value Ref Range Status   Specimen Description BLOOD LEFT FOREARM  Final   Special Requests   Final    BOTTLES DRAWN AEROBIC ONLY Blood Culture results may not be optimal due to an inadequate volume of blood received in culture bottles   Culture   Final    NO GROWTH < 24 HOURS Performed at [facility] [address], [place], [place] [address]    Report Status PENDING  Incomplete  MRSA PCR Screening     Status: None   Collection Time: 10/10/17  9:12 PM  Result Value Ref Range Status   MRSA by PCR NEGATIVE NEGATIVE Final    Comment:        The GeneXpert MRSA Assay (FDA approved for NASAL specimens only), is one component of a comprehensive MRSA colonization surveillance program. It is not intended to diagnose MRSA infection nor to guide or monitor treatment for MRSA infections. Performed at [facility], [place] [address], [place], [place] [address]      Labs: Basic Metabolic Panel: Recent Labs  Lab 10/10/17 1149 10/11/17 0530  NA 131* 136  K 6.7* 4.0  CL 96* 98  CO2 19* 27  GLUCOSE 98 94  BUN 67* 32*  CREATININE 9.58* 6.21*  CALCIUM 9.7 8.7*   Liver Function Tests: Recent  Labs  Lab 10/10/17 1149  AST 9*  ALT 12  ALKPHOS 55  BILITOT 0.7  PROT 7.5  ALBUMIN 3.6   No results for input(s): LIPASE, AMYLASE in the last 168 hours. No results for input(s): AMMONIA in the last 168 hours. CBC: Recent Labs  Lab 10/10/17 1149 10/11/17 0530  WBC 9.3 7.2  NEUTROABS 6.0  --   HGB 11.9* 10.4*  HCT 35.9* 31.1*  MCV 102.6* 100.6*  PLT 182 145*   Cardiac Enzymes: No results for input(s): CKTOTAL, CKMB, CKMBINDEX, TROPONINI in the last 168 hours. BNP: BNP (last 3 results) No results for input(s): BNP in the last 8760 hours.  ProBNP (last 3 results) No results for input(s): PROBNP in the last 8760 hours.  CBG: Recent Labs  Lab 10/10/17 1300  GLUCAP 94       Signed:  Radene Gunning MD.  [facility] 10/11/2017, 1:04 PM

## 2017-10-11 NOTE — Progress Notes (Addendum)
Progress Note  Patient Name: Eric [name] Date of Encounter: 10/11/2017  Primary Cardiologist: No primary care provider on file. L Gerhardt/ S Klein    Subjective   No dizziness   No CP   No SOB    Inpatient Medications    Scheduled Meds: . allopurinol  100 mg Oral Daily  . atorvastatin  20 mg Oral BH-q7a  . Chlorhexidine Gluconate Cloth  6 each Topical Q0600  . Chlorhexidine Gluconate Cloth  6 each Topical Q0600  . cinacalcet  60 mg Oral Q T,Th,Sat-1800  . docusate sodium  100 mg Oral BID  . enoxaparin (LOVENOX) injection  30 mg Subcutaneous Q24H  . metoprolol tartrate  12.5 mg Oral BID  . pantoprazole  40 mg Oral Daily  . sodium chloride flush  3 mL Intravenous Q12H   Continuous Infusions:  PRN Meds: acetaminophen **OR** acetaminophen, calcium carbonate (dosed in mg elemental calcium), camphor-menthol **AND** hydrOXYzine, docusate sodium, feeding supplement (NEPRO CARB STEADY), ondansetron **OR** ondansetron (ZOFRAN) IV, sorbitol, zolpidem   Vital Signs    Vitals:   10/11/17 0930 10/11/17 1000 10/11/17 1028 10/11/17 1150  BP: (!) 88/55 (!) 103/46 (!) 103/55 (!) 114/54  Pulse: 68 68 70 69  Resp:   16 16  Temp:   (!) 97.5 F (36.4 C) 97.6 F (36.4 C)  TempSrc:   Oral Oral  SpO2:   96% 95%  Weight:   78.7 kg   Height:        Intake/Output Summary (Last 24 hours) at 10/11/2017 1240 Last data filed at 10/11/2017 1028 Gross per 24 hour  Intake 250 ml  Output -522 ml  Net 772 ml   Filed Weights   10/10/17 2011 10/11/17 0740 10/11/17 1028  Weight: 78.2 kg 78.4 kg 78.7 kg    Telemetry    SR  - Personally Reviewed  ECG      Physical Exam   GEN: No acute distress.   Neck: No JVD Cardiac: RRR, no murmurs, rubs, or gallops.  Respiratory: Clear to auscultation bilaterally. GI: Soft, nontender, non-distended  MS: No edema; No deformity. Neuro:  Nonfocal  Psych: Normal affect   Labs    Chemistry Recent Labs  Lab 10/10/17 1149 10/11/17 0530  NA  131* 136  K 6.7* 4.0  CL 96* 98  CO2 19* 27  GLUCOSE 98 94  BUN 67* 32*  CREATININE 9.58* 6.21*  CALCIUM 9.7 8.7*  PROT 7.5  --   ALBUMIN 3.6  --   AST 9*  --   ALT 12  --   ALKPHOS 55  --   BILITOT 0.7  --   GFRNONAA 4* 7*  GFRAA 5* 8*  ANIONGAP 16* 11     Hematology Recent Labs  Lab 10/10/17 1149 10/11/17 0530  WBC 9.3 7.2  RBC 3.50* 3.09*  HGB 11.9* 10.4*  HCT 35.9* 31.1*  MCV 102.6* 100.6*  MCH 34.0 33.7  MCHC 33.1 33.4  RDW 14.0 14.1  PLT 182 145*    Cardiac EnzymesNo results for input(s): TROPONINI in the last 168 hours.  Recent Labs  Lab 10/10/17 1257  TROPIPOC 0.03     BNPNo results for input(s): BNP, PROBNP in the last 168 hours.   DDimer No results for input(s): DDIMER in the last 168 hours.   Radiology    Dg Chest Port 1 View  Result Date: 10/10/2017 CLINICAL DATA:  Hypotension, exertional shortness of breath. Dialysis dependent renal failure. Previous CABG EXAM: PORTABLE CHEST 1 VIEW COMPARISON:  PA and lateral chest x-ray of January 15, 2015 FINDINGS: The lungs are well-expanded and clear. The cardiac silhouette is enlarged. The pulmonary vascularity is not engorged. There is no pulmonary edema or pleural effusion. The sternal wires are intact. There is calcification in the wall of the aortic arch. The observed bony thorax is unremarkable. IMPRESSION: Mild cardiomegaly. No pulmonary edema, pneumonia, nor other acute cardiopulmonary abnormality. Thoracic aortic atherosclerosis. Electronically Signed   By: David  Martinique M.D.   On: 10/10/2017 12:13    Cardiac Studies     Patient Profile     84 y.o. male hx of CAD and AV dz (s/p CABG and AVR)    Admitted yesterday with weakness, hypotension  Assessment & Plan    1  Weakness   / hypotension  Improved   Pt feels good   Getting dialysis this AM Note that amlodipine has been d/c'd   Will need f/u of this as outpt  From cardiac standpoint I think he is OK to go after dialysis  Will make sure he  has outpt f/u   2  Hx CAD   I am not convinced episode above represents anginal equivalent  3  Hx LBBB  Old  4  Hx AV dz  S/p AVR     For questions or updates, please contact [facility] Please consult [email or web address] for contact info under        Signed, Dorris Carnes, MD  10/11/2017, 12:40 PM

## 2017-10-11 NOTE — Progress Notes (Signed)
Chaplain was contacted by unit secretary, when he called back pt was in the process of being discharged. Chaplain stated pt can return at any time for questions or to notarize paperwork.

## 2017-10-13 DIAGNOSIS — N186 End stage renal disease: Secondary | ICD-10-CM | POA: Diagnosis not present

## 2017-10-13 DIAGNOSIS — N2581 Secondary hyperparathyroidism of renal origin: Secondary | ICD-10-CM | POA: Diagnosis not present

## 2017-10-13 DIAGNOSIS — N184 Chronic kidney disease, stage 4 (severe): Secondary | ICD-10-CM | POA: Diagnosis not present

## 2017-10-13 DIAGNOSIS — D631 Anemia in chronic kidney disease: Secondary | ICD-10-CM | POA: Diagnosis not present

## 2017-10-15 DIAGNOSIS — N2581 Secondary hyperparathyroidism of renal origin: Secondary | ICD-10-CM | POA: Diagnosis not present

## 2017-10-15 DIAGNOSIS — D631 Anemia in chronic kidney disease: Secondary | ICD-10-CM | POA: Diagnosis not present

## 2017-10-15 DIAGNOSIS — N186 End stage renal disease: Secondary | ICD-10-CM | POA: Diagnosis not present

## 2017-10-15 DIAGNOSIS — N184 Chronic kidney disease, stage 4 (severe): Secondary | ICD-10-CM | POA: Diagnosis not present

## 2017-10-15 LAB — CULTURE, BLOOD (ROUTINE X 2)
Culture: NO GROWTH
Culture: NO GROWTH
Special Requests: ADEQUATE

## 2017-10-18 DIAGNOSIS — N2581 Secondary hyperparathyroidism of renal origin: Secondary | ICD-10-CM | POA: Diagnosis not present

## 2017-10-18 DIAGNOSIS — D631 Anemia in chronic kidney disease: Secondary | ICD-10-CM | POA: Diagnosis not present

## 2017-10-18 DIAGNOSIS — N186 End stage renal disease: Secondary | ICD-10-CM | POA: Diagnosis not present

## 2017-10-18 DIAGNOSIS — N184 Chronic kidney disease, stage 4 (severe): Secondary | ICD-10-CM | POA: Diagnosis not present

## 2017-10-20 DIAGNOSIS — N186 End stage renal disease: Secondary | ICD-10-CM | POA: Diagnosis not present

## 2017-10-20 DIAGNOSIS — D631 Anemia in chronic kidney disease: Secondary | ICD-10-CM | POA: Diagnosis not present

## 2017-10-20 DIAGNOSIS — N184 Chronic kidney disease, stage 4 (severe): Secondary | ICD-10-CM | POA: Diagnosis not present

## 2017-10-20 DIAGNOSIS — N2581 Secondary hyperparathyroidism of renal origin: Secondary | ICD-10-CM | POA: Diagnosis not present

## 2017-10-22 DIAGNOSIS — N186 End stage renal disease: Secondary | ICD-10-CM | POA: Diagnosis not present

## 2017-10-22 DIAGNOSIS — N184 Chronic kidney disease, stage 4 (severe): Secondary | ICD-10-CM | POA: Diagnosis not present

## 2017-10-22 DIAGNOSIS — D631 Anemia in chronic kidney disease: Secondary | ICD-10-CM | POA: Diagnosis not present

## 2017-10-22 DIAGNOSIS — N2581 Secondary hyperparathyroidism of renal origin: Secondary | ICD-10-CM | POA: Diagnosis not present

## 2017-10-25 DIAGNOSIS — N186 End stage renal disease: Secondary | ICD-10-CM | POA: Diagnosis not present

## 2017-10-25 DIAGNOSIS — D631 Anemia in chronic kidney disease: Secondary | ICD-10-CM | POA: Diagnosis not present

## 2017-10-25 DIAGNOSIS — N2581 Secondary hyperparathyroidism of renal origin: Secondary | ICD-10-CM | POA: Diagnosis not present

## 2017-10-25 DIAGNOSIS — N184 Chronic kidney disease, stage 4 (severe): Secondary | ICD-10-CM | POA: Diagnosis not present

## 2017-10-27 DIAGNOSIS — D631 Anemia in chronic kidney disease: Secondary | ICD-10-CM | POA: Diagnosis not present

## 2017-10-27 DIAGNOSIS — N184 Chronic kidney disease, stage 4 (severe): Secondary | ICD-10-CM | POA: Diagnosis not present

## 2017-10-27 DIAGNOSIS — N186 End stage renal disease: Secondary | ICD-10-CM | POA: Diagnosis not present

## 2017-10-27 DIAGNOSIS — N2581 Secondary hyperparathyroidism of renal origin: Secondary | ICD-10-CM | POA: Diagnosis not present

## 2017-10-29 DIAGNOSIS — D631 Anemia in chronic kidney disease: Secondary | ICD-10-CM | POA: Diagnosis not present

## 2017-10-29 DIAGNOSIS — N186 End stage renal disease: Secondary | ICD-10-CM | POA: Diagnosis not present

## 2017-10-29 DIAGNOSIS — N2581 Secondary hyperparathyroidism of renal origin: Secondary | ICD-10-CM | POA: Diagnosis not present

## 2017-10-29 DIAGNOSIS — N184 Chronic kidney disease, stage 4 (severe): Secondary | ICD-10-CM | POA: Diagnosis not present

## 2017-11-01 ENCOUNTER — Ambulatory Visit: Payer: Self-pay | Admitting: Nurse Practitioner

## 2017-11-01 DIAGNOSIS — N184 Chronic kidney disease, stage 4 (severe): Secondary | ICD-10-CM | POA: Diagnosis not present

## 2017-11-01 DIAGNOSIS — N2581 Secondary hyperparathyroidism of renal origin: Secondary | ICD-10-CM | POA: Diagnosis not present

## 2017-11-01 DIAGNOSIS — N186 End stage renal disease: Secondary | ICD-10-CM | POA: Diagnosis not present

## 2017-11-01 DIAGNOSIS — D631 Anemia in chronic kidney disease: Secondary | ICD-10-CM | POA: Diagnosis not present

## 2017-11-03 DIAGNOSIS — N186 End stage renal disease: Secondary | ICD-10-CM | POA: Diagnosis not present

## 2017-11-03 DIAGNOSIS — N2581 Secondary hyperparathyroidism of renal origin: Secondary | ICD-10-CM | POA: Diagnosis not present

## 2017-11-03 DIAGNOSIS — D631 Anemia in chronic kidney disease: Secondary | ICD-10-CM | POA: Diagnosis not present

## 2017-11-03 DIAGNOSIS — N184 Chronic kidney disease, stage 4 (severe): Secondary | ICD-10-CM | POA: Diagnosis not present

## 2017-11-04 DIAGNOSIS — N2889 Other specified disorders of kidney and ureter: Secondary | ICD-10-CM | POA: Diagnosis not present

## 2017-11-04 DIAGNOSIS — Z992 Dependence on renal dialysis: Secondary | ICD-10-CM | POA: Diagnosis not present

## 2017-11-04 DIAGNOSIS — N186 End stage renal disease: Secondary | ICD-10-CM | POA: Diagnosis not present

## 2017-11-05 DIAGNOSIS — N184 Chronic kidney disease, stage 4 (severe): Secondary | ICD-10-CM | POA: Diagnosis not present

## 2017-11-05 DIAGNOSIS — D631 Anemia in chronic kidney disease: Secondary | ICD-10-CM | POA: Diagnosis not present

## 2017-11-05 DIAGNOSIS — N2581 Secondary hyperparathyroidism of renal origin: Secondary | ICD-10-CM | POA: Diagnosis not present

## 2017-11-05 DIAGNOSIS — N186 End stage renal disease: Secondary | ICD-10-CM | POA: Diagnosis not present

## 2017-11-05 DIAGNOSIS — D509 Iron deficiency anemia, unspecified: Secondary | ICD-10-CM | POA: Diagnosis not present

## 2017-11-07 ENCOUNTER — Encounter: Payer: Self-pay | Admitting: Nurse Practitioner

## 2017-11-07 ENCOUNTER — Ambulatory Visit (INDEPENDENT_AMBULATORY_CARE_PROVIDER_SITE_OTHER): Payer: Medicare Other | Admitting: Nurse Practitioner

## 2017-11-07 VITALS — BP 140/58 | HR 65 | Ht 69.0 in | Wt 180.0 lb

## 2017-11-07 DIAGNOSIS — I25810 Atherosclerosis of coronary artery bypass graft(s) without angina pectoris: Secondary | ICD-10-CM

## 2017-11-07 DIAGNOSIS — E7849 Other hyperlipidemia: Secondary | ICD-10-CM | POA: Diagnosis not present

## 2017-11-07 DIAGNOSIS — I10 Essential (primary) hypertension: Secondary | ICD-10-CM

## 2017-11-07 NOTE — Progress Notes (Signed)
CARDIOLOGY OFFICE NOTE  Date:  11/07/2017    Eric [name] Date of Birth: March 12, 1930 Medical Record #[id number]  PCP:  Gayland Curry, DO  Cardiologist:  Ree Shay    Chief Complaint  Patient presents with  . Follow-up    Post hospital visit  seen for Dr. Caryl Comes    History of Present Illness: Eric [name] is a 84 y.o. male who presents today for a follow up visit. Seen for Dr. Caryl Comes.   He has a history of ESRD on HD via AV fistula, insomnia, HTN, and GERD. 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 No actual documentation of this. Reports history of cardiac arrest approximately 2 years ago while having fistula procedure - sounds like he had a stress test at that time.   I saw himoriginally inNovember of 2016as a new patient for DOE - had abnormal EKG and murmur on exam. Ended up getting echo, CT (negative for PE) and cath.   Cardiac cath on 11/27/2014 showed severe 3-vessel coronary disease. The proximal and mid LAD were heavily calcified with 80% proximal and 99% mid LAD stenosis followed by a 90% mid stenosis. The LCX had a 60% stenosis in a large OM2 and an occluded OM3 that long but small in diameter or underfilled by the collat from the LAD. The RCA is occluded distally with faint filling of a small diameter PDA branch by collat from the LAD. His echo showed at least moderate AS with a mean gradient of 36 mm Hg and a DI of 0.21. LVEF was reduced to 35% with anterior, septal and inferior wall motion abnormalities.   He underwent CABG x 3 with LIMA to LAD, SVG to OM2 and SVG to PDA with AVR with #23 Edwards Magna-Ease pericardial valve by Dr. Cyndia Bent on 12/09/2014. Nephrology assisted with his dialysis. He did initially require some inotropic support but this was able to be weaned over time  without difficulty. He had no significant postoperative cardiac dysrhythmias. He is on chronic Aranesp managed by the nephrologist. He has done well since - he did have lots of fatigue in 2018 - echo was updated and this turned out ok.   I have followed him since - seen last month - very fatigued - very hypotensive - was referred on to the ER by EMS. Wife with progressive dementia. Norvasc was stopped.   Comes back today. Here withhis wife. He did not drive here today. He feels better. Notes his fatigue has improved significantly. No chest pain. Breathing is good. Dry weight is a little higher. He feels like doing more except now his back is his most limiting factor. He is happy with how he is doing.   Past Medical History:  Diagnosis Date  . Anxiety   . Arthritis   . Complication of anesthesia    " one time my heart stopped due to a medication that I was on."   . ESRD (end stage renal disease) on dialysis ([place])    Tues, Thursday, Saturday; [facility]; [facility] (10/10/2017)  . GERD (gastroesophageal reflux disease)   . Gout   . Heart murmur    as a teen  . Herpes zoster 04/2012  . Hypertension   . Shortness of breath dyspnea    with  exertion    Past Surgical History:  Procedure Laterality Date  . AORTIC VALVE REPLACEMENT N/A 12/09/2014   Procedure: AORTIC VALVE REPLACEMENT (AVR) WITH 23MM MAGNA EASE BIOPROSTHETIC VALVE;  Surgeon: Gaye Pollack, MD;  Location: [facility] OR;  Service: Open Heart Surgery;  Laterality: N/A;  . APPENDECTOMY  1944  . AV FISTULA PLACEMENT Left    Left Cimino AVF placed in [place]   . AV FISTULA PLACEMENT Right 03/06/2014   Procedure: ARTERIOVENOUS (AV) FISTULA CREATION-right radiocephalic;  Surgeon: Mal Misty, MD;  Location: [facility] OR;  Service: Vascular;  Laterality: Right;  . AV FISTULA PLACEMENT Right 07/15/2014   Procedure: ARTERIOVENOUS (AV) FISTULA CREATION;  Surgeon: Elam Dutch, MD;  Location: [facility];  Service: Vascular;  Laterality:  Right;  . CARDIAC CATHETERIZATION N/A 11/27/2014   Procedure: Right/Left Heart Cath and Coronary Angiography;  Surgeon: Burnell Blanks, MD;  Location: [facility];  Service: Cardiovascular;  Laterality: N/A;  . CATARACT EXTRACTION W/ INTRAOCULAR LENS IMPLANT Bilateral 2014   [facility]  . COLONOSCOPY  2013   Dr. Annamaria Helling [place], [place].  . CORONARY ARTERY BYPASS GRAFT N/A 12/09/2014   Procedure: CORONARY ARTERY BYPASS GRAFTING (CABG), ON PUMP, TIMES THREE, USING LEFT INTERNAL MAMMARY ARTERY, RIGHT GREATER SAPHENOUS VEIN HARVESTED ENDOSCOPICALLY;  Surgeon: Gaye Pollack, MD;  Location: [facility];  Service: Open Heart Surgery;  Laterality: N/A;  LIMA-LAD; SVG-OM; SVG-PD  . DIALYSIS FISTULA CREATION  08/04/2012 and 10/13   Dr. Harden [place]  . INSERTION OF DIALYSIS CATHETER Right 01-15-14   Right chest TDC placed by Dr. Augustin Coupe at [facility]  . LIGATION OF ARTERIOVENOUS  FISTULA Right 07/15/2014   Procedure: LIGATION OF ARTERIOVENOUS  FISTULA  (RIGHT RADIOCEPHALIC);  Surgeon: Elam Dutch, MD;  Location: [facility];  Service: Vascular;  Laterality: Right;  . LIGATION OF COMPETING BRANCHES OF ARTERIOVENOUS FISTULA Right 05/08/2014   Procedure: LIGATION OF COMPETING BRANCHES OF RIGHT ARM RADIOCEPHALIC ARTERIOVENOUS FISTULA;  Surgeon: Mal Misty, MD;  Location: [facility];  Service: Vascular;  Laterality: Right;  . REVISON OF ARTERIOVENOUS FISTULA Right 07/15/2014   Procedure: EXPLORATION OF ARTERIOVENOUS FISTULA;  Surgeon: Elam Dutch, MD;  Location: [facility];  Service: Vascular;  Laterality: Right;  . TEE WITHOUT CARDIOVERSION N/A 12/09/2014   Procedure: TRANSESOPHAGEAL ECHOCARDIOGRAM (TEE);  Surgeon: Gaye Pollack, MD;  Location: [facility];  Service: Open Heart Surgery;  Laterality: N/A;     Medications: Current Meds  Medication Sig  . allopurinol (ZYLOPRIM) 100 MG tablet Take 100 mg by mouth daily.  Marland Kitchen atorvastatin (LIPITOR) 20 MG tablet Take 20 mg by mouth daily at 6 PM.  . calcium  carbonate (TUMS - DOSED IN MG ELEMENTAL CALCIUM) 500 MG chewable tablet Chew 2 tablets by mouth daily as needed for indigestion.   . cinacalcet (SENSIPAR) 60 MG tablet Take 60 mg by mouth every Tuesday, Thursday, and Saturday at 6 PM.   . loratadine (CLARITIN) 10 MG tablet Take 10 mg daily by mouth.  . metoprolol tartrate (LOPRESSOR) 25 MG tablet Take 0.5 tablets (12.5 mg total) by mouth 2 (two) times daily. Please make yearly appt with Truitt Merle, NP for February. 1st attempt  . omeprazole (PRILOSEC) 20 MG capsule Take 20 mg by mouth daily.      Allergies: No Known Allergies  Social History: The patient  reports that he has never smoked. He has never used smokeless tobacco. He reports that he drinks about 7.0 standard drinks of  alcohol per week. He reports that he does not use drugs.   Family History: The patient's family history includes Cancer in his mother; Diabetes in his father; Lung cancer (age of onset: 30) in his father.   Review of Systems: Please see the history of present illness.   Otherwise, the review of systems is positive for none.   All other systems are reviewed and negative.   Physical Exam: VS:  BP (!) 140/58 (BP Location: Left Arm, Patient Position: Sitting, Cuff Size: Normal)   Pulse 65   Ht 5\' 9"  (1.753 m)   Wt 180 lb (81.6 kg)   SpO2 100%   BMI 26.58 kg/m  .  BMI Body mass index is 26.58 kg/m.  Wt Readings from Last 3 Encounters:  11/07/17 180 lb (81.6 kg)  10/11/17 173 lb 8 oz (78.7 kg)  10/10/17 177 lb 12.8 oz (80.6 kg)    General: Pleasant. Elderly. He looks better today. Alert and in no acute distress.  BP much improved.  HEENT: Normal.  Neck: Supple, no JVD, carotid bruits, or masses noted.  Cardiac: Regular rate and rhythm. Harsh outflow murmur. No edema.  Respiratory:  Lungs are clear to auscultation bilaterally with normal work of breathing.  GI: Soft and nontender.  MS: No deformity or atrophy. Gait and ROM intact.  Skin: Warm and dry.  Color is normal.  Neuro:  Strength and sensation are intact and no gross focal deficits noted.  Psych: Alert, appropriate and with normal affect.   LABORATORY DATA:  EKG:  EKG is not ordered today.  Lab Results  Component Value Date   WBC 7.2 10/11/2017   HGB 10.4 (L) 10/11/2017   HCT 31.1 (L) 10/11/2017   PLT 145 (L) 10/11/2017   GLUCOSE 94 10/11/2017   CHOL 125 02/07/2017   TRIG 80 02/07/2017   HDL 37 (L) 02/07/2017   LDLCALC 72 02/07/2017   ALT 12 10/10/2017   AST 9 (L) 10/10/2017   NA 136 10/11/2017   K 4.0 10/11/2017   CL 98 10/11/2017   CREATININE 6.21 (H) 10/11/2017   BUN 32 (H) 10/11/2017   CO2 27 10/11/2017   INR 1.42 12/09/2014   HGBA1C 5.2 12/06/2014     BNP (last 3 results) No results for input(s): BNP in the last 8760 hours.  ProBNP (last 3 results) No results for input(s): PROBNP in the last 8760 hours.   Other Studies Reviewed Today:  EchoStudy Conclusions6/2018  - Procedure narrative: Transthoracic echocardiography. Image quality was adequate. Intravenous contrast (Definity) was administered. - Left ventricle: The cavity size was normal. Wall thickness was increased in a pattern of moderate LVH. Systolic function was moderately reduced. The estimated ejection fraction was in the range of 35% to 40%. Akinesis of the mid-apicalanteroseptal myocardium. Doppler parameters are consistent with abnormal left ventricular relaxation (grade 1 diastolic dysfunction). - Aortic valve: A bioprosthesis was present. There was trivial regurgitation. - Mitral valve: There was mild regurgitation. - Right atrium: The atrium was mildly dilated.    Assessment/Plan:  1.Prior episode of hypotension - now improved. No longer on Norvasc. He is feeling better clinically. His fatigue has improved. No further changes made today. I have left him on his Lopressor. We both agree that further testing/evaluation is not warranted at this time.   2.  CAD/AS - prior CABG/AVR in 2016. No active symptoms. Last echo from 06/2016 - will consider updating on return.   3. LV dysfunction - not short of breath - managed with  dialysis.   4. ESRD - on dialysis- dry weight a little higher now.   5. Chronic Anemia - remains on Aranesp - managed by Renal. Not discussed.   6. HLD - on statin therapy.   7. Chronic LBBB  Current medicines are reviewed with the patient today.  The patient does not have concerns regarding medicines other than what has been noted above.  The following changes have been made:  See above.  Labs/ tests ordered today include:   No orders of the defined types were placed in this encounter.    Disposition:   FU with me in 4 months.   Patient is agreeable to this plan and will call if any problems develop in the interim.   SignedTruitt Merle, NP  11/07/2017 2:53 PM  [facility] [address] [place],   [address] Phone: [phone] Fax: [phone]

## 2017-11-07 NOTE — Patient Instructions (Signed)
We will be checking the following labs today - NONE  Medication Instructions:    Continue with your current medicines.    If you need a refill on your cardiac medications before your next appointment, please call your pharmacy.     Testing/Procedures To Be Arranged:  N/A  Follow-Up:   See me in 4 months.    At [facility], you and your health needs are our priority.  As part of our continuing mission to provide you with exceptional heart care, we have created designated Provider Care Teams.  These Care Teams include your primary Cardiologist (physician) and Advanced Practice Providers (APPs -  Physician Assistants and Nurse Practitioners) who all work together to provide you with the care you need, when you need it.  Special Instructions:  . None  Call the [place] office at [phone] if you have any questions, problems or concerns.

## 2017-11-08 DIAGNOSIS — N184 Chronic kidney disease, stage 4 (severe): Secondary | ICD-10-CM | POA: Diagnosis not present

## 2017-11-08 DIAGNOSIS — N2581 Secondary hyperparathyroidism of renal origin: Secondary | ICD-10-CM | POA: Diagnosis not present

## 2017-11-08 DIAGNOSIS — D631 Anemia in chronic kidney disease: Secondary | ICD-10-CM | POA: Diagnosis not present

## 2017-11-08 DIAGNOSIS — N186 End stage renal disease: Secondary | ICD-10-CM | POA: Diagnosis not present

## 2017-11-08 DIAGNOSIS — D509 Iron deficiency anemia, unspecified: Secondary | ICD-10-CM | POA: Diagnosis not present

## 2017-11-10 DIAGNOSIS — N2581 Secondary hyperparathyroidism of renal origin: Secondary | ICD-10-CM | POA: Diagnosis not present

## 2017-11-10 DIAGNOSIS — N184 Chronic kidney disease, stage 4 (severe): Secondary | ICD-10-CM | POA: Diagnosis not present

## 2017-11-10 DIAGNOSIS — N186 End stage renal disease: Secondary | ICD-10-CM | POA: Diagnosis not present

## 2017-11-10 DIAGNOSIS — D509 Iron deficiency anemia, unspecified: Secondary | ICD-10-CM | POA: Diagnosis not present

## 2017-11-10 DIAGNOSIS — D631 Anemia in chronic kidney disease: Secondary | ICD-10-CM | POA: Diagnosis not present

## 2017-11-12 DIAGNOSIS — N186 End stage renal disease: Secondary | ICD-10-CM | POA: Diagnosis not present

## 2017-11-12 DIAGNOSIS — D631 Anemia in chronic kidney disease: Secondary | ICD-10-CM | POA: Diagnosis not present

## 2017-11-12 DIAGNOSIS — N2581 Secondary hyperparathyroidism of renal origin: Secondary | ICD-10-CM | POA: Diagnosis not present

## 2017-11-12 DIAGNOSIS — N184 Chronic kidney disease, stage 4 (severe): Secondary | ICD-10-CM | POA: Diagnosis not present

## 2017-11-12 DIAGNOSIS — D509 Iron deficiency anemia, unspecified: Secondary | ICD-10-CM | POA: Diagnosis not present

## 2017-11-15 DIAGNOSIS — D509 Iron deficiency anemia, unspecified: Secondary | ICD-10-CM | POA: Diagnosis not present

## 2017-11-15 DIAGNOSIS — N184 Chronic kidney disease, stage 4 (severe): Secondary | ICD-10-CM | POA: Diagnosis not present

## 2017-11-15 DIAGNOSIS — N186 End stage renal disease: Secondary | ICD-10-CM | POA: Diagnosis not present

## 2017-11-15 DIAGNOSIS — N2581 Secondary hyperparathyroidism of renal origin: Secondary | ICD-10-CM | POA: Diagnosis not present

## 2017-11-15 DIAGNOSIS — D631 Anemia in chronic kidney disease: Secondary | ICD-10-CM | POA: Diagnosis not present

## 2017-11-16 ENCOUNTER — Encounter: Payer: Self-pay | Admitting: Internal Medicine

## 2017-11-16 ENCOUNTER — Non-Acute Institutional Stay: Payer: Medicare Other | Admitting: Internal Medicine

## 2017-11-16 VITALS — BP 122/64 | HR 76 | Temp 97.9°F | Ht 69.0 in | Wt 179.2 lb

## 2017-11-16 DIAGNOSIS — N2581 Secondary hyperparathyroidism of renal origin: Secondary | ICD-10-CM | POA: Diagnosis not present

## 2017-11-16 DIAGNOSIS — I9589 Other hypotension: Secondary | ICD-10-CM | POA: Diagnosis not present

## 2017-11-16 DIAGNOSIS — I25810 Atherosclerosis of coronary artery bypass graft(s) without angina pectoris: Secondary | ICD-10-CM | POA: Diagnosis not present

## 2017-11-16 DIAGNOSIS — Z992 Dependence on renal dialysis: Secondary | ICD-10-CM | POA: Diagnosis not present

## 2017-11-16 DIAGNOSIS — I770 Arteriovenous fistula, acquired: Secondary | ICD-10-CM | POA: Diagnosis not present

## 2017-11-16 DIAGNOSIS — E861 Hypovolemia: Secondary | ICD-10-CM

## 2017-11-16 DIAGNOSIS — N186 End stage renal disease: Secondary | ICD-10-CM

## 2017-11-16 DIAGNOSIS — D649 Anemia, unspecified: Secondary | ICD-10-CM

## 2017-11-16 DIAGNOSIS — I25110 Atherosclerotic heart disease of native coronary artery with unstable angina pectoris: Secondary | ICD-10-CM

## 2017-11-16 NOTE — Progress Notes (Signed)
Location:  Occupational psychologist of Service:  Clinic (12)  Provider: Aniello Christopoulos L. Mariea Clonts, D.O., C.M.D.  Code Status: DNR Goals of Care:  Advanced Directives 10/10/2017  Does Patient Have a Medical Advance Directive? Yes  Type of Paramedic of [place];Living will;Out of facility DNR (pink MOST or yellow form)  Does patient want to make changes to medical advance directive? No - Patient declined  Copy of [place] in Chart? No - copy requested  Would patient like information on creating a medical advance directive? No - Patient declined  Pre-existing out of facility DNR order (yellow form or pink MOST form) Physician notified to receive inpatient order     Chief Complaint  Patient presents with  . Medical Management of Chronic Issues    6 month follow-up and complete handicap placard   . Immunizations    Flu vaccine UTD per patient, had in September or October 2019     HPI: Patient is a 84 y.o. male seen today for medical management of chronic diseases.    Got his flu shot at dialysis.  He went for his regular checkup and bp 70/35.  Was hospitalized.  Got HD two days in a row.  He was taken off amlodpine.  He has much more energy now.  Reports his bp has been that low at HD before, but it recovers.  He does not feel it.  He was observed and then discharged the next day.  They also had raised his dry weight.  Then his pants get tight.  Able to walk a 1/4 mile now.    He does not always remember his tums to keep his phosphorus down.    He gets oxygen at HD at times.  Says sometimes when he wakes up early, he feels short of breath when it's still dark out.    He asked me to complete his handicapped plaquard b/c it's harder to walk longer distances.    He's had no problems with chest pain.    Past Medical History:  Diagnosis Date  . Anxiety   . Arthritis   . Complication of anesthesia    " one time my heart stopped  due to a medication that I was on."   . ESRD (end stage renal disease) on dialysis ([place])    Tues, Thursday, Saturday; [facility]; [place] (10/10/2017)  . GERD (gastroesophageal reflux disease)   . Gout   . Heart murmur    as a teen  . Herpes zoster 04/2012  . Hypertension   . Shortness of breath dyspnea    with exertion    Past Surgical History:  Procedure Laterality Date  . AORTIC VALVE REPLACEMENT N/A 12/09/2014   Procedure: AORTIC VALVE REPLACEMENT (AVR) WITH 23MM MAGNA EASE BIOPROSTHETIC VALVE;  Surgeon: Gaye Pollack, MD;  Location: [place] OR;  Service: Open Heart Surgery;  Laterality: N/A;  . APPENDECTOMY  1944  . AV FISTULA PLACEMENT Left    Left Cimino AVF placed in [place]   . AV FISTULA PLACEMENT Right 03/06/2014   Procedure: ARTERIOVENOUS (AV) FISTULA CREATION-right radiocephalic;  Surgeon: Mal Misty, MD;  Location: [facility] OR;  Service: Vascular;  Laterality: Right;  . AV FISTULA PLACEMENT Right 07/15/2014   Procedure: ARTERIOVENOUS (AV) FISTULA CREATION;  Surgeon: Elam Dutch, MD;  Location: [place];  Service: Vascular;  Laterality: Right;  . CARDIAC CATHETERIZATION N/A 11/27/2014   Procedure: Right/Left Heart Cath and Coronary  Angiography;  Surgeon: Burnell Blanks, MD;  Location: [place] [facility];  Service: Cardiovascular;  Laterality: N/A;  . CATARACT EXTRACTION W/ INTRAOCULAR LENS IMPLANT Bilateral 2014   [facility]  . COLONOSCOPY  2013   Dr. Annamaria Helling [place], [place].  . CORONARY ARTERY BYPASS GRAFT N/A 12/09/2014   Procedure: CORONARY ARTERY BYPASS GRAFTING (CABG), ON PUMP, TIMES THREE, USING LEFT INTERNAL MAMMARY ARTERY, RIGHT GREATER SAPHENOUS VEIN HARVESTED ENDOSCOPICALLY;  Surgeon: Gaye Pollack, MD;  Location: [place];  Service: Open Heart Surgery;  Laterality: N/A;  LIMA-LAD; SVG-OM; SVG-PD  . DIALYSIS FISTULA CREATION  08/04/2012 and 10/13   Dr. Harden Mo  . INSERTION OF DIALYSIS CATHETER Right 01-15-14   Right chest TDC placed by  Dr. Augustin Coupe at [facility]  . LIGATION OF ARTERIOVENOUS  FISTULA Right 07/15/2014   Procedure: LIGATION OF ARTERIOVENOUS  FISTULA  (RIGHT RADIOCEPHALIC);  Surgeon: Elam Dutch, MD;  Location: [place];  Service: Vascular;  Laterality: Right;  . LIGATION OF COMPETING BRANCHES OF ARTERIOVENOUS FISTULA Right 05/08/2014   Procedure: LIGATION OF COMPETING BRANCHES OF RIGHT ARM RADIOCEPHALIC ARTERIOVENOUS FISTULA;  Surgeon: Mal Misty, MD;  Location: [place];  Service: Vascular;  Laterality: Right;  . REVISON OF ARTERIOVENOUS FISTULA Right 07/15/2014   Procedure: EXPLORATION OF ARTERIOVENOUS FISTULA;  Surgeon: Elam Dutch, MD;  Location: [place];  Service: Vascular;  Laterality: Right;  . TEE WITHOUT CARDIOVERSION N/A 12/09/2014   Procedure: TRANSESOPHAGEAL ECHOCARDIOGRAM (TEE);  Surgeon: Gaye Pollack, MD;  Location: [place];  Service: Open Heart Surgery;  Laterality: N/A;    No Known Allergies  Outpatient Encounter Medications as of 11/16/2017  Medication Sig  . allopurinol (ZYLOPRIM) 100 MG tablet Take 100 mg by mouth daily.  Marland Kitchen atorvastatin (LIPITOR) 20 MG tablet Take 20 mg by mouth daily at 6 PM.  . calcium carbonate (TUMS - DOSED IN MG ELEMENTAL CALCIUM) 500 MG chewable tablet Chew 2 tablets by mouth daily as needed for indigestion.   . cinacalcet (SENSIPAR) 60 MG tablet Take 60 mg by mouth every Monday, Wednesday, Friday, and Saturday at 6 PM.   . loratadine (CLARITIN) 10 MG tablet Take 10 mg daily by mouth.  . metoprolol tartrate (LOPRESSOR) 25 MG tablet Take 0.5 tablets (12.5 mg total) by mouth 2 (two) times daily. Please make yearly appt with Truitt Merle, NP for February. 1st attempt  . omeprazole (PRILOSEC) 20 MG capsule Take 20 mg by mouth daily.    No facility-administered encounter medications on file as of 11/16/2017.     Review of Systems:  Review of Systems  Constitutional: Negative for chills and fever.  HENT: Negative for congestion.   Eyes: Negative for blurred vision.        Reading glasses  Respiratory: Negative for shortness of breath.   Cardiovascular: Negative for chest pain, palpitations and leg swelling.  Gastrointestinal: Negative for abdominal pain, blood in stool, constipation, heartburn and melena.  Genitourinary: Negative for dysuria.  Musculoskeletal: Negative for falls and joint pain.  Neurological: Negative for dizziness and loss of consciousness.       Only dizzy sometimes at dialysis  Endo/Heme/Allergies: Bruises/bleeds easily.  Psychiatric/Behavioral: Negative for depression and memory loss. The patient is not nervous/anxious and does not have insomnia.     Health Maintenance  Topic Date Due  . TETANUS/TDAP  06/02/2027  . INFLUENZA VACCINE  Completed  . PNA vac Low Risk Adult  Completed    Physical Exam: Vitals:   11/16/17 0934  BP: 122/64  Pulse: 76  Temp: 97.9 F (36.6 C)  TempSrc: Oral  SpO2: 95%  Weight: 179 lb 3.2 oz (81.3 kg)  Height: 5\' 9"  (1.753 m)   Body mass index is 26.46 kg/m. Physical Exam  Constitutional: He is oriented to person, place, and time. He appears well-developed. No distress.  HENT:  Some swelling of face  Cardiovascular: Normal rate, regular rhythm and intact distal pulses.  Murmur heard. Pulmonary/Chest: Effort normal and breath sounds normal. No respiratory distress.  Abdominal: Bowel sounds are normal.  Musculoskeletal: Normal range of motion.  Neurological: He is alert and oriented to person, place, and time.  Skin: Skin is warm and dry.  Psychiatric: He has a normal mood and affect.    Labs reviewed: Basic Metabolic Panel: Recent Labs    05/02/17 10/10/17 1149 10/11/17 0530  NA  --  131* 136  K 4.9 6.7* 4.0  CL  --  96* 98  CO2  --  19* 27  GLUCOSE  --  98 94  BUN  --  67* 32*  CREATININE  --  9.58* 6.21*  CALCIUM  --  9.7 8.7*   Liver Function Tests: Recent Labs    02/07/17 1106 10/10/17 1149  AST 10 9*  ALT 9 12  ALKPHOS 65 55  BILITOT 0.3 0.7  PROT 6.8 7.5  ALBUMIN  4.0 3.6   No results for input(s): LIPASE, AMYLASE in the last 8760 hours. No results for input(s): AMMONIA in the last 8760 hours. CBC: Recent Labs    05/02/17 10/10/17 1149 10/11/17 0530  WBC  --  9.3 7.2  NEUTROABS  --  6.0  --   HGB 12.9* 11.9* 10.4*  HCT  --  35.9* 31.1*  MCV  --  102.6* 100.6*  PLT  --  182 145*   Lipid Panel: Recent Labs    02/07/17 1106  CHOL 125  HDL 37*  LDLCALC 72  TRIG 80  CHOLHDL 3.4   Lab Results  Component Value Date   HGBA1C 5.2 12/06/2014    Assessment/Plan 1. Hypotension due to hypovolemia -resolved with increasing dry weight and stopping norvasc -bp is good today despite change -suspect pt may have had low bps outside of here before, but not detected  2. ESRD on dialysis [facility]) -continues t/r/s HD sessions in am -brought his labs from there today  3. A-V fistula (HCC) -for HD, remains in place  4. Coronary artery disease involving native coronary artery of native heart with unstable angina pectoris ([place]) -cont secondary prevention strategies  5. Secondary hyperparathyroidism, renal (HCC) -cont tums, sensipar  6. Normocytic anemia -related to CKD, not on treatment, monitor  Labs/tests ordered:  No new, pt brought HD labs and we will abstract and scan (as not all abstractable) Next appt:  6 mos med mgt  Jentry Mcqueary L. Mitsuye Schrodt, D.O. [facility] [address],  [address] Cell Phone (Mon-Fri 8am-5pm):  [phone] On Call:  [phone] & follow prompts after 5pm & weekends Office Phone:  [phone] Office Fax:  [phone]

## 2017-11-17 DIAGNOSIS — D631 Anemia in chronic kidney disease: Secondary | ICD-10-CM | POA: Diagnosis not present

## 2017-11-17 DIAGNOSIS — N2581 Secondary hyperparathyroidism of renal origin: Secondary | ICD-10-CM | POA: Diagnosis not present

## 2017-11-17 DIAGNOSIS — D509 Iron deficiency anemia, unspecified: Secondary | ICD-10-CM | POA: Diagnosis not present

## 2017-11-17 DIAGNOSIS — N184 Chronic kidney disease, stage 4 (severe): Secondary | ICD-10-CM | POA: Diagnosis not present

## 2017-11-17 DIAGNOSIS — N186 End stage renal disease: Secondary | ICD-10-CM | POA: Diagnosis not present

## 2017-11-19 DIAGNOSIS — N184 Chronic kidney disease, stage 4 (severe): Secondary | ICD-10-CM | POA: Diagnosis not present

## 2017-11-19 DIAGNOSIS — N186 End stage renal disease: Secondary | ICD-10-CM | POA: Diagnosis not present

## 2017-11-19 DIAGNOSIS — D631 Anemia in chronic kidney disease: Secondary | ICD-10-CM | POA: Diagnosis not present

## 2017-11-19 DIAGNOSIS — D509 Iron deficiency anemia, unspecified: Secondary | ICD-10-CM | POA: Diagnosis not present

## 2017-11-19 DIAGNOSIS — N2581 Secondary hyperparathyroidism of renal origin: Secondary | ICD-10-CM | POA: Diagnosis not present

## 2017-11-22 DIAGNOSIS — D631 Anemia in chronic kidney disease: Secondary | ICD-10-CM | POA: Diagnosis not present

## 2017-11-22 DIAGNOSIS — D509 Iron deficiency anemia, unspecified: Secondary | ICD-10-CM | POA: Diagnosis not present

## 2017-11-22 DIAGNOSIS — N184 Chronic kidney disease, stage 4 (severe): Secondary | ICD-10-CM | POA: Diagnosis not present

## 2017-11-22 DIAGNOSIS — N186 End stage renal disease: Secondary | ICD-10-CM | POA: Diagnosis not present

## 2017-11-22 DIAGNOSIS — N2581 Secondary hyperparathyroidism of renal origin: Secondary | ICD-10-CM | POA: Diagnosis not present

## 2017-11-24 DIAGNOSIS — N2581 Secondary hyperparathyroidism of renal origin: Secondary | ICD-10-CM | POA: Diagnosis not present

## 2017-11-24 DIAGNOSIS — D631 Anemia in chronic kidney disease: Secondary | ICD-10-CM | POA: Diagnosis not present

## 2017-11-24 DIAGNOSIS — D509 Iron deficiency anemia, unspecified: Secondary | ICD-10-CM | POA: Diagnosis not present

## 2017-11-24 DIAGNOSIS — N186 End stage renal disease: Secondary | ICD-10-CM | POA: Diagnosis not present

## 2017-11-24 DIAGNOSIS — N184 Chronic kidney disease, stage 4 (severe): Secondary | ICD-10-CM | POA: Diagnosis not present

## 2017-11-26 DIAGNOSIS — N2581 Secondary hyperparathyroidism of renal origin: Secondary | ICD-10-CM | POA: Diagnosis not present

## 2017-11-26 DIAGNOSIS — N184 Chronic kidney disease, stage 4 (severe): Secondary | ICD-10-CM | POA: Diagnosis not present

## 2017-11-26 DIAGNOSIS — D509 Iron deficiency anemia, unspecified: Secondary | ICD-10-CM | POA: Diagnosis not present

## 2017-11-26 DIAGNOSIS — D631 Anemia in chronic kidney disease: Secondary | ICD-10-CM | POA: Diagnosis not present

## 2017-11-26 DIAGNOSIS — N186 End stage renal disease: Secondary | ICD-10-CM | POA: Diagnosis not present

## 2017-11-28 DIAGNOSIS — D509 Iron deficiency anemia, unspecified: Secondary | ICD-10-CM | POA: Diagnosis not present

## 2017-11-28 DIAGNOSIS — D631 Anemia in chronic kidney disease: Secondary | ICD-10-CM | POA: Diagnosis not present

## 2017-11-28 DIAGNOSIS — N2581 Secondary hyperparathyroidism of renal origin: Secondary | ICD-10-CM | POA: Diagnosis not present

## 2017-11-28 DIAGNOSIS — N184 Chronic kidney disease, stage 4 (severe): Secondary | ICD-10-CM | POA: Diagnosis not present

## 2017-11-28 DIAGNOSIS — N186 End stage renal disease: Secondary | ICD-10-CM | POA: Diagnosis not present

## 2017-11-30 DIAGNOSIS — N186 End stage renal disease: Secondary | ICD-10-CM | POA: Diagnosis not present

## 2017-11-30 DIAGNOSIS — D509 Iron deficiency anemia, unspecified: Secondary | ICD-10-CM | POA: Diagnosis not present

## 2017-11-30 DIAGNOSIS — N184 Chronic kidney disease, stage 4 (severe): Secondary | ICD-10-CM | POA: Diagnosis not present

## 2017-11-30 DIAGNOSIS — N2581 Secondary hyperparathyroidism of renal origin: Secondary | ICD-10-CM | POA: Diagnosis not present

## 2017-11-30 DIAGNOSIS — D631 Anemia in chronic kidney disease: Secondary | ICD-10-CM | POA: Diagnosis not present

## 2017-12-03 DIAGNOSIS — N186 End stage renal disease: Secondary | ICD-10-CM | POA: Diagnosis not present

## 2017-12-03 DIAGNOSIS — D631 Anemia in chronic kidney disease: Secondary | ICD-10-CM | POA: Diagnosis not present

## 2017-12-03 DIAGNOSIS — N184 Chronic kidney disease, stage 4 (severe): Secondary | ICD-10-CM | POA: Diagnosis not present

## 2017-12-03 DIAGNOSIS — D509 Iron deficiency anemia, unspecified: Secondary | ICD-10-CM | POA: Diagnosis not present

## 2017-12-03 DIAGNOSIS — N2581 Secondary hyperparathyroidism of renal origin: Secondary | ICD-10-CM | POA: Diagnosis not present

## 2017-12-04 DIAGNOSIS — Z992 Dependence on renal dialysis: Secondary | ICD-10-CM | POA: Diagnosis not present

## 2017-12-04 DIAGNOSIS — N186 End stage renal disease: Secondary | ICD-10-CM | POA: Diagnosis not present

## 2017-12-04 DIAGNOSIS — N2889 Other specified disorders of kidney and ureter: Secondary | ICD-10-CM | POA: Diagnosis not present

## 2017-12-06 DIAGNOSIS — D631 Anemia in chronic kidney disease: Secondary | ICD-10-CM | POA: Diagnosis not present

## 2017-12-06 DIAGNOSIS — N184 Chronic kidney disease, stage 4 (severe): Secondary | ICD-10-CM | POA: Diagnosis not present

## 2017-12-06 DIAGNOSIS — N186 End stage renal disease: Secondary | ICD-10-CM | POA: Diagnosis not present

## 2017-12-06 DIAGNOSIS — N2581 Secondary hyperparathyroidism of renal origin: Secondary | ICD-10-CM | POA: Diagnosis not present

## 2017-12-06 DIAGNOSIS — D509 Iron deficiency anemia, unspecified: Secondary | ICD-10-CM | POA: Diagnosis not present

## 2017-12-08 DIAGNOSIS — D631 Anemia in chronic kidney disease: Secondary | ICD-10-CM | POA: Diagnosis not present

## 2017-12-08 DIAGNOSIS — N2581 Secondary hyperparathyroidism of renal origin: Secondary | ICD-10-CM | POA: Diagnosis not present

## 2017-12-08 DIAGNOSIS — N184 Chronic kidney disease, stage 4 (severe): Secondary | ICD-10-CM | POA: Diagnosis not present

## 2017-12-08 DIAGNOSIS — D509 Iron deficiency anemia, unspecified: Secondary | ICD-10-CM | POA: Diagnosis not present

## 2017-12-08 DIAGNOSIS — N186 End stage renal disease: Secondary | ICD-10-CM | POA: Diagnosis not present

## 2017-12-10 DIAGNOSIS — N186 End stage renal disease: Secondary | ICD-10-CM | POA: Diagnosis not present

## 2017-12-10 DIAGNOSIS — N2581 Secondary hyperparathyroidism of renal origin: Secondary | ICD-10-CM | POA: Diagnosis not present

## 2017-12-10 DIAGNOSIS — D509 Iron deficiency anemia, unspecified: Secondary | ICD-10-CM | POA: Diagnosis not present

## 2017-12-10 DIAGNOSIS — N184 Chronic kidney disease, stage 4 (severe): Secondary | ICD-10-CM | POA: Diagnosis not present

## 2017-12-10 DIAGNOSIS — D631 Anemia in chronic kidney disease: Secondary | ICD-10-CM | POA: Diagnosis not present

## 2017-12-13 DIAGNOSIS — D509 Iron deficiency anemia, unspecified: Secondary | ICD-10-CM | POA: Diagnosis not present

## 2017-12-13 DIAGNOSIS — N184 Chronic kidney disease, stage 4 (severe): Secondary | ICD-10-CM | POA: Diagnosis not present

## 2017-12-13 DIAGNOSIS — N186 End stage renal disease: Secondary | ICD-10-CM | POA: Diagnosis not present

## 2017-12-13 DIAGNOSIS — N2581 Secondary hyperparathyroidism of renal origin: Secondary | ICD-10-CM | POA: Diagnosis not present

## 2017-12-13 DIAGNOSIS — D631 Anemia in chronic kidney disease: Secondary | ICD-10-CM | POA: Diagnosis not present

## 2017-12-15 DIAGNOSIS — D631 Anemia in chronic kidney disease: Secondary | ICD-10-CM | POA: Diagnosis not present

## 2017-12-15 DIAGNOSIS — N2581 Secondary hyperparathyroidism of renal origin: Secondary | ICD-10-CM | POA: Diagnosis not present

## 2017-12-15 DIAGNOSIS — N186 End stage renal disease: Secondary | ICD-10-CM | POA: Diagnosis not present

## 2017-12-15 DIAGNOSIS — N184 Chronic kidney disease, stage 4 (severe): Secondary | ICD-10-CM | POA: Diagnosis not present

## 2017-12-15 DIAGNOSIS — D509 Iron deficiency anemia, unspecified: Secondary | ICD-10-CM | POA: Diagnosis not present

## 2017-12-17 DIAGNOSIS — N186 End stage renal disease: Secondary | ICD-10-CM | POA: Diagnosis not present

## 2017-12-17 DIAGNOSIS — D631 Anemia in chronic kidney disease: Secondary | ICD-10-CM | POA: Diagnosis not present

## 2017-12-17 DIAGNOSIS — D509 Iron deficiency anemia, unspecified: Secondary | ICD-10-CM | POA: Diagnosis not present

## 2017-12-17 DIAGNOSIS — N2581 Secondary hyperparathyroidism of renal origin: Secondary | ICD-10-CM | POA: Diagnosis not present

## 2017-12-17 DIAGNOSIS — N184 Chronic kidney disease, stage 4 (severe): Secondary | ICD-10-CM | POA: Diagnosis not present

## 2017-12-20 DIAGNOSIS — N186 End stage renal disease: Secondary | ICD-10-CM | POA: Diagnosis not present

## 2017-12-20 DIAGNOSIS — D509 Iron deficiency anemia, unspecified: Secondary | ICD-10-CM | POA: Diagnosis not present

## 2017-12-20 DIAGNOSIS — N2581 Secondary hyperparathyroidism of renal origin: Secondary | ICD-10-CM | POA: Diagnosis not present

## 2017-12-20 DIAGNOSIS — D631 Anemia in chronic kidney disease: Secondary | ICD-10-CM | POA: Diagnosis not present

## 2017-12-20 DIAGNOSIS — N184 Chronic kidney disease, stage 4 (severe): Secondary | ICD-10-CM | POA: Diagnosis not present

## 2017-12-22 DIAGNOSIS — N184 Chronic kidney disease, stage 4 (severe): Secondary | ICD-10-CM | POA: Diagnosis not present

## 2017-12-22 DIAGNOSIS — N2581 Secondary hyperparathyroidism of renal origin: Secondary | ICD-10-CM | POA: Diagnosis not present

## 2017-12-22 DIAGNOSIS — N186 End stage renal disease: Secondary | ICD-10-CM | POA: Diagnosis not present

## 2017-12-22 DIAGNOSIS — D509 Iron deficiency anemia, unspecified: Secondary | ICD-10-CM | POA: Diagnosis not present

## 2017-12-22 DIAGNOSIS — D631 Anemia in chronic kidney disease: Secondary | ICD-10-CM | POA: Diagnosis not present

## 2017-12-24 DIAGNOSIS — D631 Anemia in chronic kidney disease: Secondary | ICD-10-CM | POA: Diagnosis not present

## 2017-12-24 DIAGNOSIS — N2581 Secondary hyperparathyroidism of renal origin: Secondary | ICD-10-CM | POA: Diagnosis not present

## 2017-12-24 DIAGNOSIS — N186 End stage renal disease: Secondary | ICD-10-CM | POA: Diagnosis not present

## 2017-12-24 DIAGNOSIS — D509 Iron deficiency anemia, unspecified: Secondary | ICD-10-CM | POA: Diagnosis not present

## 2017-12-24 DIAGNOSIS — N184 Chronic kidney disease, stage 4 (severe): Secondary | ICD-10-CM | POA: Diagnosis not present

## 2017-12-26 DIAGNOSIS — N186 End stage renal disease: Secondary | ICD-10-CM | POA: Diagnosis not present

## 2017-12-26 DIAGNOSIS — N184 Chronic kidney disease, stage 4 (severe): Secondary | ICD-10-CM | POA: Diagnosis not present

## 2017-12-26 DIAGNOSIS — D631 Anemia in chronic kidney disease: Secondary | ICD-10-CM | POA: Diagnosis not present

## 2017-12-26 DIAGNOSIS — D509 Iron deficiency anemia, unspecified: Secondary | ICD-10-CM | POA: Diagnosis not present

## 2017-12-26 DIAGNOSIS — N2581 Secondary hyperparathyroidism of renal origin: Secondary | ICD-10-CM | POA: Diagnosis not present

## 2017-12-29 DIAGNOSIS — D631 Anemia in chronic kidney disease: Secondary | ICD-10-CM | POA: Diagnosis not present

## 2017-12-29 DIAGNOSIS — D509 Iron deficiency anemia, unspecified: Secondary | ICD-10-CM | POA: Diagnosis not present

## 2017-12-29 DIAGNOSIS — N184 Chronic kidney disease, stage 4 (severe): Secondary | ICD-10-CM | POA: Diagnosis not present

## 2017-12-29 DIAGNOSIS — N2581 Secondary hyperparathyroidism of renal origin: Secondary | ICD-10-CM | POA: Diagnosis not present

## 2017-12-29 DIAGNOSIS — N186 End stage renal disease: Secondary | ICD-10-CM | POA: Diagnosis not present

## 2017-12-31 DIAGNOSIS — N184 Chronic kidney disease, stage 4 (severe): Secondary | ICD-10-CM | POA: Diagnosis not present

## 2017-12-31 DIAGNOSIS — D509 Iron deficiency anemia, unspecified: Secondary | ICD-10-CM | POA: Diagnosis not present

## 2017-12-31 DIAGNOSIS — N2581 Secondary hyperparathyroidism of renal origin: Secondary | ICD-10-CM | POA: Diagnosis not present

## 2017-12-31 DIAGNOSIS — N186 End stage renal disease: Secondary | ICD-10-CM | POA: Diagnosis not present

## 2017-12-31 DIAGNOSIS — D631 Anemia in chronic kidney disease: Secondary | ICD-10-CM | POA: Diagnosis not present

## 2018-01-02 DIAGNOSIS — D509 Iron deficiency anemia, unspecified: Secondary | ICD-10-CM | POA: Diagnosis not present

## 2018-01-02 DIAGNOSIS — D631 Anemia in chronic kidney disease: Secondary | ICD-10-CM | POA: Diagnosis not present

## 2018-01-02 DIAGNOSIS — N2581 Secondary hyperparathyroidism of renal origin: Secondary | ICD-10-CM | POA: Diagnosis not present

## 2018-01-02 DIAGNOSIS — N184 Chronic kidney disease, stage 4 (severe): Secondary | ICD-10-CM | POA: Diagnosis not present

## 2018-01-02 DIAGNOSIS — N186 End stage renal disease: Secondary | ICD-10-CM | POA: Diagnosis not present

## 2018-01-04 DIAGNOSIS — Z992 Dependence on renal dialysis: Secondary | ICD-10-CM | POA: Diagnosis not present

## 2018-01-04 DIAGNOSIS — N2889 Other specified disorders of kidney and ureter: Secondary | ICD-10-CM | POA: Diagnosis not present

## 2018-01-04 DIAGNOSIS — N186 End stage renal disease: Secondary | ICD-10-CM | POA: Diagnosis not present

## 2018-01-05 DIAGNOSIS — N2581 Secondary hyperparathyroidism of renal origin: Secondary | ICD-10-CM | POA: Diagnosis not present

## 2018-01-05 DIAGNOSIS — N186 End stage renal disease: Secondary | ICD-10-CM | POA: Diagnosis not present

## 2018-01-05 DIAGNOSIS — N184 Chronic kidney disease, stage 4 (severe): Secondary | ICD-10-CM | POA: Diagnosis not present

## 2018-01-05 DIAGNOSIS — D631 Anemia in chronic kidney disease: Secondary | ICD-10-CM | POA: Diagnosis not present

## 2018-01-07 DIAGNOSIS — N186 End stage renal disease: Secondary | ICD-10-CM | POA: Diagnosis not present

## 2018-01-07 DIAGNOSIS — N2581 Secondary hyperparathyroidism of renal origin: Secondary | ICD-10-CM | POA: Diagnosis not present

## 2018-01-07 DIAGNOSIS — N184 Chronic kidney disease, stage 4 (severe): Secondary | ICD-10-CM | POA: Diagnosis not present

## 2018-01-07 DIAGNOSIS — D631 Anemia in chronic kidney disease: Secondary | ICD-10-CM | POA: Diagnosis not present

## 2018-01-10 DIAGNOSIS — N184 Chronic kidney disease, stage 4 (severe): Secondary | ICD-10-CM | POA: Diagnosis not present

## 2018-01-10 DIAGNOSIS — N186 End stage renal disease: Secondary | ICD-10-CM | POA: Diagnosis not present

## 2018-01-10 DIAGNOSIS — N2581 Secondary hyperparathyroidism of renal origin: Secondary | ICD-10-CM | POA: Diagnosis not present

## 2018-01-10 DIAGNOSIS — D631 Anemia in chronic kidney disease: Secondary | ICD-10-CM | POA: Diagnosis not present

## 2018-01-12 ENCOUNTER — Telehealth: Payer: Self-pay | Admitting: *Deleted

## 2018-01-12 DIAGNOSIS — D631 Anemia in chronic kidney disease: Secondary | ICD-10-CM | POA: Diagnosis not present

## 2018-01-12 DIAGNOSIS — N184 Chronic kidney disease, stage 4 (severe): Secondary | ICD-10-CM | POA: Diagnosis not present

## 2018-01-12 DIAGNOSIS — N2581 Secondary hyperparathyroidism of renal origin: Secondary | ICD-10-CM | POA: Diagnosis not present

## 2018-01-12 DIAGNOSIS — N186 End stage renal disease: Secondary | ICD-10-CM | POA: Diagnosis not present

## 2018-01-12 NOTE — Telephone Encounter (Signed)
Spoke with patient and scheduled appt for Monday 01/16/18

## 2018-01-12 NOTE — Telephone Encounter (Signed)
Eric [name] will need a visit to officially get qualified for oxygen therapy.  Medicare may cover this for him considering his dialysis status.  We can do this at the office or [facility].

## 2018-01-12 NOTE — Telephone Encounter (Signed)
Patient called and stated that he wants to buy a Oxygen Concentrator but stated to even buy it out of pocket he has to have a Rx from his PCP. Patient is requesting a Rx. Please Advise.

## 2018-01-14 DIAGNOSIS — N186 End stage renal disease: Secondary | ICD-10-CM | POA: Diagnosis not present

## 2018-01-14 DIAGNOSIS — D631 Anemia in chronic kidney disease: Secondary | ICD-10-CM | POA: Diagnosis not present

## 2018-01-14 DIAGNOSIS — N184 Chronic kidney disease, stage 4 (severe): Secondary | ICD-10-CM | POA: Diagnosis not present

## 2018-01-14 DIAGNOSIS — N2581 Secondary hyperparathyroidism of renal origin: Secondary | ICD-10-CM | POA: Diagnosis not present

## 2018-01-16 ENCOUNTER — Ambulatory Visit (INDEPENDENT_AMBULATORY_CARE_PROVIDER_SITE_OTHER): Payer: Medicare Other | Admitting: Internal Medicine

## 2018-01-16 ENCOUNTER — Encounter: Payer: Self-pay | Admitting: Internal Medicine

## 2018-01-16 VITALS — BP 138/80 | HR 95 | Temp 97.6°F | Ht 69.0 in | Wt 183.0 lb

## 2018-01-16 DIAGNOSIS — J9611 Chronic respiratory failure with hypoxia: Secondary | ICD-10-CM | POA: Diagnosis not present

## 2018-01-16 DIAGNOSIS — N186 End stage renal disease: Secondary | ICD-10-CM

## 2018-01-16 DIAGNOSIS — Z992 Dependence on renal dialysis: Secondary | ICD-10-CM

## 2018-01-16 NOTE — Progress Notes (Signed)
Location:  [facility] clinic Provider:  Clif Serio L. Mariea Clonts, D.O., C.M.D.  Code Status: DNR Goals of Care:  Advanced Directives 10/10/2017  Does Patient Have a Medical Advance Directive? Yes  Type of Paramedic of [place];Living will;Out of facility DNR (pink MOST or yellow form)  Does patient want to make changes to medical advance directive? No - Patient declined  Copy of [place] in Chart? No - copy requested  Would patient like information on creating a medical advance directive? No - Patient declined  Pre-existing out of facility DNR order (yellow form or pink MOST form) Physician notified to receive inpatient order     Chief Complaint  Patient presents with  . Follow-up    pt want oxygen on 3rd day of dialysis    HPI: Patient is a 85 y.o. male with CAD, ESRD on HD seen today for oxygen qualification due to dyspnea on exertion and suspected hypoxia pre-HD when he's got the 3 days b/w HD sessions .    2 days, 2 days, then 3 days.  Gasps for air all night the night before when he has the 3rd day.  He's better with oxygen and dialysis at that point.  He's been told they cannot qualify him there for some reason.  We attempted to qualify his today.  Sats on RA at rest:  95%, RA ambulating:  96%, no need to apply oxygen when ambulating.  He needs oxygen when he is due for dialysis and has the extra day b/w sessions.  He goes Tues, Thurs, Sat so it's on Mon night to Tues am when he's dyspneic and has to rest when ambulating.  He is in need of the [employer]ogen portable O2 therapy and can rent the concentrator for his room from [place] on a monthly basis.  Walking from his apt to the front desk, he has to stop and catch his breath a few times.    Past Medical History:  Diagnosis Date  . Anxiety   . Arthritis   . Complication of anesthesia    " one time my heart stopped due to a medication that I was on."   . ESRD (end stage renal disease) on dialysis  ([place])    Tues, Thursday, Saturday; [facility]; [place] (10/10/2017)  . GERD (gastroesophageal reflux disease)   . Gout   . Heart murmur    as a teen  . Herpes zoster 04/2012  . Hypertension   . Shortness of breath dyspnea    with exertion    Past Surgical History:  Procedure Laterality Date  . AORTIC VALVE REPLACEMENT N/A 12/09/2014   Procedure: AORTIC VALVE REPLACEMENT (AVR) WITH 23MM MAGNA EASE BIOPROSTHETIC VALVE;  Surgeon: Gaye Pollack, MD;  Location: [place] OR;  Service: Open Heart Surgery;  Laterality: N/A;  . APPENDECTOMY  1944  . AV FISTULA PLACEMENT Left    Left Cimino AVF placed in [place]   . AV FISTULA PLACEMENT Right 03/06/2014   Procedure: ARTERIOVENOUS (AV) FISTULA CREATION-right radiocephalic;  Surgeon: Mal Misty, MD;  Location: [facility] OR;  Service: Vascular;  Laterality: Right;  . AV FISTULA PLACEMENT Right 07/15/2014   Procedure: ARTERIOVENOUS (AV) FISTULA CREATION;  Surgeon: Elam Dutch, MD;  Location: [place];  Service: Vascular;  Laterality: Right;  . CARDIAC CATHETERIZATION N/A 11/27/2014   Procedure: Right/Left Heart Cath and Coronary Angiography;  Surgeon: Burnell Blanks, MD;  Location: [place] [facility];  Service: Cardiovascular;  Laterality:  N/A;  . CATARACT EXTRACTION W/ INTRAOCULAR LENS IMPLANT Bilateral 2014   [facility]  . COLONOSCOPY  2013   Dr. Annamaria Helling [place], [place].  . CORONARY ARTERY BYPASS GRAFT N/A 12/09/2014   Procedure: CORONARY ARTERY BYPASS GRAFTING (CABG), ON PUMP, TIMES THREE, USING LEFT INTERNAL MAMMARY ARTERY, RIGHT GREATER SAPHENOUS VEIN HARVESTED ENDOSCOPICALLY;  Surgeon: Gaye Pollack, MD;  Location: [place];  Service: Open Heart Surgery;  Laterality: N/A;  LIMA-LAD; SVG-OM; SVG-PD  . DIALYSIS FISTULA CREATION  08/04/2012 and 10/13   Dr. Harden Mo  . INSERTION OF DIALYSIS CATHETER Right 01-15-14   Right chest TDC placed by Dr. Augustin Coupe at [facility]  . LIGATION OF ARTERIOVENOUS  FISTULA Right 07/15/2014    Procedure: LIGATION OF ARTERIOVENOUS  FISTULA  (RIGHT RADIOCEPHALIC);  Surgeon: Elam Dutch, MD;  Location: [place];  Service: Vascular;  Laterality: Right;  . LIGATION OF COMPETING BRANCHES OF ARTERIOVENOUS FISTULA Right 05/08/2014   Procedure: LIGATION OF COMPETING BRANCHES OF RIGHT ARM RADIOCEPHALIC ARTERIOVENOUS FISTULA;  Surgeon: Mal Misty, MD;  Location: [place];  Service: Vascular;  Laterality: Right;  . REVISON OF ARTERIOVENOUS FISTULA Right 07/15/2014   Procedure: EXPLORATION OF ARTERIOVENOUS FISTULA;  Surgeon: Elam Dutch, MD;  Location: [place];  Service: Vascular;  Laterality: Right;  . TEE WITHOUT CARDIOVERSION N/A 12/09/2014   Procedure: TRANSESOPHAGEAL ECHOCARDIOGRAM (TEE);  Surgeon: Gaye Pollack, MD;  Location: [place];  Service: Open Heart Surgery;  Laterality: N/A;    No Known Allergies  Outpatient Encounter Medications as of 01/16/2018  Medication Sig  . allopurinol (ZYLOPRIM) 100 MG tablet Take 100 mg by mouth daily.  Marland Kitchen atorvastatin (LIPITOR) 20 MG tablet Take 20 mg by mouth daily at 6 PM.  . calcium carbonate (TUMS - DOSED IN MG ELEMENTAL CALCIUM) 500 MG chewable tablet Chew 2 tablets by mouth daily as needed for indigestion.   . cinacalcet (SENSIPAR) 60 MG tablet Take 60 mg by mouth every Monday, Wednesday, Friday, and Saturday at 6 PM.   . loratadine (CLARITIN) 10 MG tablet Take 10 mg daily by mouth.  . metoprolol tartrate (LOPRESSOR) 25 MG tablet Take 0.5 tablets (12.5 mg total) by mouth 2 (two) times daily. Please make yearly appt with Truitt Merle, NP for February. 1st attempt  . omeprazole (PRILOSEC) 20 MG capsule Take 20 mg by mouth daily.    No facility-administered encounter medications on file as of 01/16/2018.     Review of Systems:  Review of Systems  Constitutional: Positive for malaise/fatigue. Negative for chills and fever.  HENT: Positive for hearing loss.   Respiratory: Positive for shortness of breath. Negative for cough and wheezing.        Sob  on mon nights and tues am  Cardiovascular: Positive for orthopnea. Negative for chest pain, palpitations and leg swelling.       On mon nights to Tuesday mornings  Gastrointestinal: Negative for abdominal pain.  Genitourinary: Negative for dysuria.  Musculoskeletal: Negative for falls and joint pain.  Skin: Negative for itching and rash.  Neurological: Negative for dizziness and loss of consciousness.  Endo/Heme/Allergies: Bruises/bleeds easily.  Psychiatric/Behavioral: Negative for memory loss.    Health Maintenance  Topic Date Due  . TETANUS/TDAP  06/02/2027  . INFLUENZA VACCINE  Completed  . PNA vac Low Risk Adult  Completed    Physical Exam: Vitals:   01/16/18 0823 01/16/18 0827  BP: 138/80   Pulse: 95   Temp: 97.6 F (36.4 C)   TempSrc:  Oral   SpO2: (!) 71% 95%  Weight: 183 lb (83 kg)   Height: 5' 9"  (1.753 m)    Body mass index is 27.02 kg/m. Physical Exam Constitutional:      Appearance: He is ill-appearing. He is not toxic-appearing.     Comments: Chronic grayish-yellow skin tone  HENT:     Head: Normocephalic and atraumatic.  Cardiovascular:     Rate and Rhythm: Normal rate and regular rhythm.     Heart sounds: Murmur present.  Pulmonary:     Breath sounds: Rales present.     Comments: Appears dyspneic after ambulating, but sats normal as above; some rales at bases and diminished breath sounds b/l; noted swelling of bilateral hands, feet and ankles normal; also face appears swollen Musculoskeletal: Normal range of motion.  Neurological:     General: No focal deficit present.     Mental Status: He is alert and oriented to person, place, and time.  Psychiatric:        Mood and Affect: Mood normal.     Labs reviewed: Basic Metabolic Panel: Recent Labs    10/10/17 10/10/17 1149 10/11/17 0530  NA  --  131* 136  K 5.3 6.7* 4.0  CL  --  96* 98  CO2  --  19* 27  GLUCOSE  --  98 94  BUN  --  67* 32*  CREATININE  --  9.58* 6.21*  CALCIUM  --  9.7 8.7*    Liver Function Tests: Recent Labs    02/07/17 1106 10/10/17 1149  AST 10 9*  ALT 9 12  ALKPHOS 65 55  BILITOT 0.3 0.7  PROT 6.8 7.5  ALBUMIN 4.0 3.6   No results for input(s): LIPASE, AMYLASE in the last 8760 hours. No results for input(s): AMMONIA in the last 8760 hours. CBC: Recent Labs    10/10/17 10/10/17 1149 10/11/17 0530  WBC  --  9.3 7.2  NEUTROABS  --  6.0  --   HGB 11.3* 11.9* 10.4*  HCT  --  35.9* 31.1*  MCV  --  102.6* 100.6*  PLT  --  182 145*   Lipid Panel: Recent Labs    02/07/17 1106  CHOL 125  HDL 37*  LDLCALC 72  TRIG 80  CHOLHDL 3.4   Lab Results  Component Value Date   HGBA1C 5.2 12/06/2014    Assessment/Plan 1. ESRD on dialysis [facility]) -it's become more challenging to maintain a dry weight for him--he's getting volume overloaded and short of breath, dyspneic on exertion on day three--he also has rales today, some diminished bases and swelling of his face and hands -sats did not drop with ambulation despite his dyspnea and clinical abnormalities  -will ask [facility] nursing to obtain medicare oxygen testing early tomorrow morning about 6:15-6:30am to see if he qualifies by medicare guidelines for oxygen--CMA will fax over form that can be completed--we will then do the prescription so he can get a concentrator for his apt and portable oxygen -if he does not qualify, we will still provide him with oxygen as he's willing to pay for it to feel better (but if he does qualify, I'd certainly like to make that happen to save him money)  2. Chronic respiratory failure with hypoxia (HCC) -primarily struggling when larger span b/w HD and sounds like the center has already been trying to tweak his dry weight to prevent this, but it's not been successful (? Reached a point where he needs longer sessions or more  frequent ones) -again, try to qualify for insurance coverage for O2, but if not met, will get Rx for prn use and pt will pay for it  Labs/tests  ordered:  No orders of the defined types were placed in this encounter.  Next appt:  05/17/2018   Esvin Hnat L. Tasha Jindra, D.O. [facility] [address], [place] [address] Cell Phone (Mon-Fri 8am-5pm):  [phone] On Call:  [phone] & follow prompts after 5pm & weekends Office Phone:  [phone] Office Fax:  [phone]

## 2018-01-17 DIAGNOSIS — N2581 Secondary hyperparathyroidism of renal origin: Secondary | ICD-10-CM | POA: Diagnosis not present

## 2018-01-17 DIAGNOSIS — N186 End stage renal disease: Secondary | ICD-10-CM | POA: Diagnosis not present

## 2018-01-17 DIAGNOSIS — N184 Chronic kidney disease, stage 4 (severe): Secondary | ICD-10-CM | POA: Diagnosis not present

## 2018-01-17 DIAGNOSIS — D631 Anemia in chronic kidney disease: Secondary | ICD-10-CM | POA: Diagnosis not present

## 2018-01-18 NOTE — Telephone Encounter (Signed)
Patient called back today and stated that he did not qualify for the Oxygen but he still wants to buy one out of pocket. Stated that he needs a rx and stated that Dr. Mariea Clonts told him that she would give him one. Please Advise.

## 2018-01-18 NOTE — Telephone Encounter (Signed)
Patient notified and agreed.  

## 2018-01-18 NOTE — Telephone Encounter (Signed)
Daisy, our medical supply clerk, is working with the oxygen supplier to get him what he needs already.  Just let him know we will be in touch when we have it all straightened out.

## 2018-01-19 DIAGNOSIS — N186 End stage renal disease: Secondary | ICD-10-CM | POA: Diagnosis not present

## 2018-01-19 DIAGNOSIS — N184 Chronic kidney disease, stage 4 (severe): Secondary | ICD-10-CM | POA: Diagnosis not present

## 2018-01-19 DIAGNOSIS — N2581 Secondary hyperparathyroidism of renal origin: Secondary | ICD-10-CM | POA: Diagnosis not present

## 2018-01-19 DIAGNOSIS — D631 Anemia in chronic kidney disease: Secondary | ICD-10-CM | POA: Diagnosis not present

## 2018-01-21 DIAGNOSIS — N186 End stage renal disease: Secondary | ICD-10-CM | POA: Diagnosis not present

## 2018-01-21 DIAGNOSIS — D631 Anemia in chronic kidney disease: Secondary | ICD-10-CM | POA: Diagnosis not present

## 2018-01-21 DIAGNOSIS — N2581 Secondary hyperparathyroidism of renal origin: Secondary | ICD-10-CM | POA: Diagnosis not present

## 2018-01-21 DIAGNOSIS — N184 Chronic kidney disease, stage 4 (severe): Secondary | ICD-10-CM | POA: Diagnosis not present

## 2018-01-24 ENCOUNTER — Telehealth: Payer: Self-pay | Admitting: *Deleted

## 2018-01-24 DIAGNOSIS — N2581 Secondary hyperparathyroidism of renal origin: Secondary | ICD-10-CM | POA: Diagnosis not present

## 2018-01-24 DIAGNOSIS — N184 Chronic kidney disease, stage 4 (severe): Secondary | ICD-10-CM | POA: Diagnosis not present

## 2018-01-24 DIAGNOSIS — D631 Anemia in chronic kidney disease: Secondary | ICD-10-CM | POA: Diagnosis not present

## 2018-01-24 DIAGNOSIS — N186 End stage renal disease: Secondary | ICD-10-CM | POA: Diagnosis not present

## 2018-01-24 NOTE — Telephone Encounter (Signed)
Patient called and stated that we will be receiving paperwork from [employer] and he wanted to let us know he did request it,Portable Oxygen Concentrator, and wants it signed and sent back.   Received fax from [place], [place], [phone] Fax: [phone] Placed in Dr. Cyndi Lennert folder to review and sign.

## 2018-01-26 DIAGNOSIS — N184 Chronic kidney disease, stage 4 (severe): Secondary | ICD-10-CM | POA: Diagnosis not present

## 2018-01-26 DIAGNOSIS — N186 End stage renal disease: Secondary | ICD-10-CM | POA: Diagnosis not present

## 2018-01-26 DIAGNOSIS — D631 Anemia in chronic kidney disease: Secondary | ICD-10-CM | POA: Diagnosis not present

## 2018-01-26 DIAGNOSIS — N2581 Secondary hyperparathyroidism of renal origin: Secondary | ICD-10-CM | POA: Diagnosis not present

## 2018-01-26 NOTE — Telephone Encounter (Signed)
Form completed and returned to [facility] for faxing. As previously noted, pt did not meet medicare criteria for oxygen so we are getting him a prn access to oxygen with a pulse dose oxygen for when he is on his last day before dialysis and feels short of breath.

## 2018-01-26 NOTE — Telephone Encounter (Signed)
Form Faxed back to [employer]: [phone]

## 2018-01-28 DIAGNOSIS — D631 Anemia in chronic kidney disease: Secondary | ICD-10-CM | POA: Diagnosis not present

## 2018-01-28 DIAGNOSIS — N186 End stage renal disease: Secondary | ICD-10-CM | POA: Diagnosis not present

## 2018-01-28 DIAGNOSIS — N2581 Secondary hyperparathyroidism of renal origin: Secondary | ICD-10-CM | POA: Diagnosis not present

## 2018-01-28 DIAGNOSIS — N184 Chronic kidney disease, stage 4 (severe): Secondary | ICD-10-CM | POA: Diagnosis not present

## 2018-01-31 DIAGNOSIS — D631 Anemia in chronic kidney disease: Secondary | ICD-10-CM | POA: Diagnosis not present

## 2018-01-31 DIAGNOSIS — N186 End stage renal disease: Secondary | ICD-10-CM | POA: Diagnosis not present

## 2018-01-31 DIAGNOSIS — N184 Chronic kidney disease, stage 4 (severe): Secondary | ICD-10-CM | POA: Diagnosis not present

## 2018-01-31 DIAGNOSIS — N2581 Secondary hyperparathyroidism of renal origin: Secondary | ICD-10-CM | POA: Diagnosis not present

## 2018-02-02 DIAGNOSIS — N2581 Secondary hyperparathyroidism of renal origin: Secondary | ICD-10-CM | POA: Diagnosis not present

## 2018-02-02 DIAGNOSIS — D631 Anemia in chronic kidney disease: Secondary | ICD-10-CM | POA: Diagnosis not present

## 2018-02-02 DIAGNOSIS — N184 Chronic kidney disease, stage 4 (severe): Secondary | ICD-10-CM | POA: Diagnosis not present

## 2018-02-02 DIAGNOSIS — N186 End stage renal disease: Secondary | ICD-10-CM | POA: Diagnosis not present

## 2018-02-04 DIAGNOSIS — N184 Chronic kidney disease, stage 4 (severe): Secondary | ICD-10-CM | POA: Diagnosis not present

## 2018-02-04 DIAGNOSIS — N186 End stage renal disease: Secondary | ICD-10-CM | POA: Diagnosis not present

## 2018-02-04 DIAGNOSIS — N2889 Other specified disorders of kidney and ureter: Secondary | ICD-10-CM | POA: Diagnosis not present

## 2018-02-04 DIAGNOSIS — D509 Iron deficiency anemia, unspecified: Secondary | ICD-10-CM | POA: Diagnosis not present

## 2018-02-04 DIAGNOSIS — D631 Anemia in chronic kidney disease: Secondary | ICD-10-CM | POA: Diagnosis not present

## 2018-02-04 DIAGNOSIS — Z992 Dependence on renal dialysis: Secondary | ICD-10-CM | POA: Diagnosis not present

## 2018-02-04 DIAGNOSIS — N2581 Secondary hyperparathyroidism of renal origin: Secondary | ICD-10-CM | POA: Diagnosis not present

## 2018-02-07 DIAGNOSIS — D631 Anemia in chronic kidney disease: Secondary | ICD-10-CM | POA: Diagnosis not present

## 2018-02-07 DIAGNOSIS — N186 End stage renal disease: Secondary | ICD-10-CM | POA: Diagnosis not present

## 2018-02-07 DIAGNOSIS — N184 Chronic kidney disease, stage 4 (severe): Secondary | ICD-10-CM | POA: Diagnosis not present

## 2018-02-07 DIAGNOSIS — N2581 Secondary hyperparathyroidism of renal origin: Secondary | ICD-10-CM | POA: Diagnosis not present

## 2018-02-07 DIAGNOSIS — D509 Iron deficiency anemia, unspecified: Secondary | ICD-10-CM | POA: Diagnosis not present

## 2018-02-09 DIAGNOSIS — D509 Iron deficiency anemia, unspecified: Secondary | ICD-10-CM | POA: Diagnosis not present

## 2018-02-09 DIAGNOSIS — N186 End stage renal disease: Secondary | ICD-10-CM | POA: Diagnosis not present

## 2018-02-09 DIAGNOSIS — N2581 Secondary hyperparathyroidism of renal origin: Secondary | ICD-10-CM | POA: Diagnosis not present

## 2018-02-09 DIAGNOSIS — D631 Anemia in chronic kidney disease: Secondary | ICD-10-CM | POA: Diagnosis not present

## 2018-02-09 DIAGNOSIS — N184 Chronic kidney disease, stage 4 (severe): Secondary | ICD-10-CM | POA: Diagnosis not present

## 2018-02-11 DIAGNOSIS — N2581 Secondary hyperparathyroidism of renal origin: Secondary | ICD-10-CM | POA: Diagnosis not present

## 2018-02-11 DIAGNOSIS — D631 Anemia in chronic kidney disease: Secondary | ICD-10-CM | POA: Diagnosis not present

## 2018-02-11 DIAGNOSIS — D509 Iron deficiency anemia, unspecified: Secondary | ICD-10-CM | POA: Diagnosis not present

## 2018-02-11 DIAGNOSIS — N186 End stage renal disease: Secondary | ICD-10-CM | POA: Diagnosis not present

## 2018-02-11 DIAGNOSIS — N184 Chronic kidney disease, stage 4 (severe): Secondary | ICD-10-CM | POA: Diagnosis not present

## 2018-02-14 DIAGNOSIS — D509 Iron deficiency anemia, unspecified: Secondary | ICD-10-CM | POA: Diagnosis not present

## 2018-02-14 DIAGNOSIS — D631 Anemia in chronic kidney disease: Secondary | ICD-10-CM | POA: Diagnosis not present

## 2018-02-14 DIAGNOSIS — N2581 Secondary hyperparathyroidism of renal origin: Secondary | ICD-10-CM | POA: Diagnosis not present

## 2018-02-14 DIAGNOSIS — N184 Chronic kidney disease, stage 4 (severe): Secondary | ICD-10-CM | POA: Diagnosis not present

## 2018-02-14 DIAGNOSIS — N186 End stage renal disease: Secondary | ICD-10-CM | POA: Diagnosis not present

## 2018-02-16 DIAGNOSIS — D509 Iron deficiency anemia, unspecified: Secondary | ICD-10-CM | POA: Diagnosis not present

## 2018-02-16 DIAGNOSIS — N186 End stage renal disease: Secondary | ICD-10-CM | POA: Diagnosis not present

## 2018-02-16 DIAGNOSIS — D631 Anemia in chronic kidney disease: Secondary | ICD-10-CM | POA: Diagnosis not present

## 2018-02-16 DIAGNOSIS — N184 Chronic kidney disease, stage 4 (severe): Secondary | ICD-10-CM | POA: Diagnosis not present

## 2018-02-16 DIAGNOSIS — N2581 Secondary hyperparathyroidism of renal origin: Secondary | ICD-10-CM | POA: Diagnosis not present

## 2018-02-18 ENCOUNTER — Encounter (HOSPITAL_COMMUNITY): Payer: Self-pay | Admitting: Emergency Medicine

## 2018-02-18 ENCOUNTER — Emergency Department (HOSPITAL_COMMUNITY)
Admission: EM | Admit: 2018-02-18 | Discharge: 2018-02-18 | Disposition: A | Discharge status: Home / Self Care | Payer: Medicare Other | Attending: Emergency Medicine | Admitting: Emergency Medicine

## 2018-02-18 DIAGNOSIS — D631 Anemia in chronic kidney disease: Secondary | ICD-10-CM | POA: Diagnosis not present

## 2018-02-18 DIAGNOSIS — R5381 Other malaise: Secondary | ICD-10-CM | POA: Diagnosis not present

## 2018-02-18 DIAGNOSIS — N2581 Secondary hyperparathyroidism of renal origin: Secondary | ICD-10-CM | POA: Diagnosis not present

## 2018-02-18 DIAGNOSIS — Z79899 Other long term (current) drug therapy: Secondary | ICD-10-CM | POA: Diagnosis not present

## 2018-02-18 DIAGNOSIS — T82838A Hemorrhage of vascular prosthetic devices, implants and grafts, initial encounter: Secondary | ICD-10-CM | POA: Diagnosis not present

## 2018-02-18 DIAGNOSIS — R58 Hemorrhage, not elsewhere classified: Secondary | ICD-10-CM | POA: Insufficient documentation

## 2018-02-18 DIAGNOSIS — N186 End stage renal disease: Secondary | ICD-10-CM | POA: Insufficient documentation

## 2018-02-18 DIAGNOSIS — D509 Iron deficiency anemia, unspecified: Secondary | ICD-10-CM | POA: Diagnosis not present

## 2018-02-18 DIAGNOSIS — N184 Chronic kidney disease, stage 4 (severe): Secondary | ICD-10-CM | POA: Diagnosis not present

## 2018-02-18 DIAGNOSIS — T83498A Other mechanical complication of other prosthetic devices, implants and grafts of genital tract, initial encounter: Secondary | ICD-10-CM | POA: Diagnosis not present

## 2018-02-18 DIAGNOSIS — I120 Hypertensive chronic kidney disease with stage 5 chronic kidney disease or end stage renal disease: Secondary | ICD-10-CM | POA: Insufficient documentation

## 2018-02-18 DIAGNOSIS — T83198A Other mechanical complication of other urinary devices and implants, initial encounter: Secondary | ICD-10-CM | POA: Diagnosis not present

## 2018-02-18 NOTE — ED Provider Notes (Signed)
[facility] EMERGENCY DEPARTMENT Provider Note   CSN: [id number] Arrival date & time: 02/18/18  1351     History   Chief Complaint Chief Complaint  Patient presents with  . Bleeding Fistula    HPI Kennan Detter is a 85 y.o. male.  The history is provided by the patient.  Illness  Location:  Right arm Severity:  Mild Onset quality:  Sudden Timing:  Constant Progression:  Resolved Chronicity:  New Context:  Bleeding from AV fistula after dialysis. Bleeding controlled upon arrival.  Associated symptoms: no rash     Past Medical History:  Diagnosis Date  . Anxiety   . Arthritis   . Complication of anesthesia    " one time my heart stopped due to a medication that I was on."   . ESRD (end stage renal disease) on dialysis ([place])    Tues, Thursday, Saturday; [facility]; [place] (10/10/2017)  . GERD (gastroesophageal reflux disease)   . Gout   . Heart murmur    as a teen  . Herpes zoster 04/2012  . Hypertension   . Shortness of breath dyspnea    with exertion    Patient Active Problem List   Diagnosis Date Noted  . Hyponatremia 10/11/2017  . Abnormal EKG   . Hypotension 10/10/2017  . Hyperkalemia 10/10/2017  . S/P CABG (coronary artery bypass graft) 10/10/2017  . Secondary hyperparathyroidism of renal origin ([place]) 05/11/2017  . A-V fistula ([place]) 11/10/2016  . Normocytic anemia 04/16/2016  . Heme positive stool 04/16/2016  . S/P AVR 12/09/2014  . Coronary artery disease involving native coronary artery of native heart with unstable angina pectoris ([place])   . Insomnia, unspecified 09/24/2013  . Pain in neck 09/24/2013  . Cough 06/04/2013  . Gout 06/04/2013  . ESRD on dialysis ([place]) 06/04/2013  . Essential hypertension   . GERD (gastroesophageal reflux disease)   . Herpes zoster 06/04/2008    Past Surgical History:  Procedure Laterality Date  . AORTIC VALVE REPLACEMENT N/A 12/09/2014   Procedure: AORTIC VALVE REPLACEMENT (AVR)  WITH 23MM MAGNA EASE BIOPROSTHETIC VALVE;  Surgeon: Gaye Pollack, MD;  Location: [facility] OR;  Service: Open Heart Surgery;  Laterality: N/A;  . APPENDECTOMY  1944  . AV FISTULA PLACEMENT Left    Left Cimino AVF placed in [place]   . AV FISTULA PLACEMENT Right 03/06/2014   Procedure: ARTERIOVENOUS (AV) FISTULA CREATION-right radiocephalic;  Surgeon: Mal Misty, MD;  Location: [facility] OR;  Service: Vascular;  Laterality: Right;  . AV FISTULA PLACEMENT Right 07/15/2014   Procedure: ARTERIOVENOUS (AV) FISTULA CREATION;  Surgeon: Elam Dutch, MD;  Location: [facility];  Service: Vascular;  Laterality: Right;  . CARDIAC CATHETERIZATION N/A 11/27/2014   Procedure: Right/Left Heart Cath and Coronary Angiography;  Surgeon: Burnell Blanks, MD;  Location: [facility] LAB;  Service: Cardiovascular;  Laterality: N/A;  . CATARACT EXTRACTION W/ INTRAOCULAR LENS IMPLANT Bilateral 2014   [facility]  . COLONOSCOPY  2013   Dr. Annamaria Helling [place], [place].  . CORONARY ARTERY BYPASS GRAFT N/A 12/09/2014   Procedure: CORONARY ARTERY BYPASS GRAFTING (CABG), ON PUMP, TIMES THREE, USING LEFT INTERNAL MAMMARY ARTERY, RIGHT GREATER SAPHENOUS VEIN HARVESTED ENDOSCOPICALLY;  Surgeon: Gaye Pollack, MD;  Location: [facility];  Service: Open Heart Surgery;  Laterality: N/A;  LIMA-LAD; SVG-OM; SVG-PD  . DIALYSIS FISTULA CREATION  08/04/2012 and 10/13   Dr. Harden Mo  . INSERTION OF DIALYSIS CATHETER Right 01-15-14  Right chest TDC placed by Dr. Augustin Coupe at [facility]  . LIGATION OF ARTERIOVENOUS  FISTULA Right 07/15/2014   Procedure: LIGATION OF ARTERIOVENOUS  FISTULA  (RIGHT RADIOCEPHALIC);  Surgeon: Elam Dutch, MD;  Location: [facility];  Service: Vascular;  Laterality: Right;  . LIGATION OF COMPETING BRANCHES OF ARTERIOVENOUS FISTULA Right 05/08/2014   Procedure: LIGATION OF COMPETING BRANCHES OF RIGHT ARM RADIOCEPHALIC ARTERIOVENOUS FISTULA;  Surgeon: Mal Misty, MD;  Location: [facility];  Service: Vascular;   Laterality: Right;  . REVISON OF ARTERIOVENOUS FISTULA Right 07/15/2014   Procedure: EXPLORATION OF ARTERIOVENOUS FISTULA;  Surgeon: Elam Dutch, MD;  Location: [facility];  Service: Vascular;  Laterality: Right;  . TEE WITHOUT CARDIOVERSION N/A 12/09/2014   Procedure: TRANSESOPHAGEAL ECHOCARDIOGRAM (TEE);  Surgeon: Gaye Pollack, MD;  Location: [facility];  Service: Open Heart Surgery;  Laterality: N/A;        Home Medications    Prior to Admission medications   Medication Sig Start Date End Date Taking? Authorizing Provider  allopurinol (ZYLOPRIM) 100 MG tablet Take 100 mg by mouth daily.    [provider]  atorvastatin (LIPITOR) 20 MG tablet Take 20 mg by mouth daily at 6 PM.    [provider]  calcium carbonate (TUMS - DOSED IN MG ELEMENTAL CALCIUM) 500 MG chewable tablet Chew 2 tablets by mouth daily as needed for indigestion.     [provider]  cinacalcet (SENSIPAR) 60 MG tablet Take 60 mg by mouth every Monday, Wednesday, Friday, and Saturday at 6 PM.     [provider]  loratadine (CLARITIN) 10 MG tablet Take 10 mg daily by mouth.    [provider]  metoprolol tartrate (LOPRESSOR) 25 MG tablet Take 0.5 tablets (12.5 mg total) by mouth 2 (two) times daily. Please make yearly appt with Truitt Merle, NP for February. 1st attempt 09/30/17   Deboraha Sprang, MD  omeprazole (PRILOSEC) 20 MG capsule Take 20 mg by mouth daily.     [provider]    Family History Family History  Problem Relation Age of Onset  . Diabetes Father   . Lung cancer Father 30       non-smoker  . Cancer Mother        multiple myeloma  . Colon cancer Neg Hx     Social History Social History   Tobacco Use  . Smoking status: Never Smoker  . Smokeless tobacco: Never Used  Substance Use Topics  . Alcohol use: Yes    Alcohol/week: 7.0 standard drinks    Types: 3 Glasses of wine, 4 Shots of liquor per week    Comment: 10/10/2017 "one glass a day of  either wine or liquor"  . Drug use: No     Allergies   Patient has no known allergies.   Review of Systems Review of Systems  Musculoskeletal: Negative for arthralgias and gait problem.  Skin: Negative for color change, pallor, rash and wound.     Physical Exam Updated Vital Signs BP 138/65   Pulse 65   Temp (!) 97.5 F (36.4 C) (Oral)   Resp 18   Ht 5' 10"  (1.778 m)   Wt 79.4 kg   SpO2 98%   BMI 25.11 kg/m   Physical Exam Skin:    General: Skin is warm and dry.     Capillary Refill: Capillary refill takes less than 2 seconds.     Comments: AV fistula site in the right upper extremity is  hemostatic.      ED Treatments / Results  Labs (all labs ordered are listed, but only abnormal results are displayed) Labs Reviewed - No data to display  EKG None  Radiology No results found.  Procedures Procedures (including critical care time)  Medications Ordered in ED Medications - No data to display   Initial Impression / Assessment and Plan / ED Course  I have reviewed the triage vital signs and the nursing notes.  Pertinent labs & imaging results that were available during my care of the patient were reviewed by me and considered in my medical decision making (see chart for details).     Rome Echavarria is an 85 year old male with history of end-stage renal disease who presents from dialysis with bleeding fistula.  There has been compression bandage placed there for the last hour or so.  Patient not on blood thinners.  Did complete full dialysis.  Upon my evaluation bleeding has stopped.  Mild pressure dressing was placed and patient was discharged from ED in good condition.  Given return precautions.  Given wound care instructions.  This chart was dictated using voice recognition software.  Despite best efforts to proofread,  errors can occur which can change the documentation meaning.    Final Clinical Impressions(s) / ED Diagnoses   Final diagnoses:    Bleeding    ED Discharge Orders    None       Lennice Sites, DO 02/18/18 1735

## 2018-02-18 NOTE — ED Triage Notes (Signed)
Pt brought in by EMS- picked up from [facility] dialysis, due to fistula bleeding after after removal from treatment. Reports bleeding x1 hour. Pt arrived to ED with pressure device applied to fistula, bleeding controlled

## 2018-02-21 DIAGNOSIS — D509 Iron deficiency anemia, unspecified: Secondary | ICD-10-CM | POA: Diagnosis not present

## 2018-02-21 DIAGNOSIS — N184 Chronic kidney disease, stage 4 (severe): Secondary | ICD-10-CM | POA: Diagnosis not present

## 2018-02-21 DIAGNOSIS — N186 End stage renal disease: Secondary | ICD-10-CM | POA: Diagnosis not present

## 2018-02-21 DIAGNOSIS — D631 Anemia in chronic kidney disease: Secondary | ICD-10-CM | POA: Diagnosis not present

## 2018-02-21 DIAGNOSIS — N2581 Secondary hyperparathyroidism of renal origin: Secondary | ICD-10-CM | POA: Diagnosis not present

## 2018-02-23 DIAGNOSIS — D509 Iron deficiency anemia, unspecified: Secondary | ICD-10-CM | POA: Diagnosis not present

## 2018-02-23 DIAGNOSIS — N2581 Secondary hyperparathyroidism of renal origin: Secondary | ICD-10-CM | POA: Diagnosis not present

## 2018-02-23 DIAGNOSIS — N186 End stage renal disease: Secondary | ICD-10-CM | POA: Diagnosis not present

## 2018-02-23 DIAGNOSIS — D631 Anemia in chronic kidney disease: Secondary | ICD-10-CM | POA: Diagnosis not present

## 2018-02-23 DIAGNOSIS — N184 Chronic kidney disease, stage 4 (severe): Secondary | ICD-10-CM | POA: Diagnosis not present

## 2018-02-25 DIAGNOSIS — N184 Chronic kidney disease, stage 4 (severe): Secondary | ICD-10-CM | POA: Diagnosis not present

## 2018-02-25 DIAGNOSIS — N186 End stage renal disease: Secondary | ICD-10-CM | POA: Diagnosis not present

## 2018-02-25 DIAGNOSIS — N2581 Secondary hyperparathyroidism of renal origin: Secondary | ICD-10-CM | POA: Diagnosis not present

## 2018-02-25 DIAGNOSIS — D631 Anemia in chronic kidney disease: Secondary | ICD-10-CM | POA: Diagnosis not present

## 2018-02-25 DIAGNOSIS — D509 Iron deficiency anemia, unspecified: Secondary | ICD-10-CM | POA: Diagnosis not present

## 2018-02-28 DIAGNOSIS — N184 Chronic kidney disease, stage 4 (severe): Secondary | ICD-10-CM | POA: Diagnosis not present

## 2018-02-28 DIAGNOSIS — N2581 Secondary hyperparathyroidism of renal origin: Secondary | ICD-10-CM | POA: Diagnosis not present

## 2018-02-28 DIAGNOSIS — N186 End stage renal disease: Secondary | ICD-10-CM | POA: Diagnosis not present

## 2018-02-28 DIAGNOSIS — D509 Iron deficiency anemia, unspecified: Secondary | ICD-10-CM | POA: Diagnosis not present

## 2018-02-28 DIAGNOSIS — D631 Anemia in chronic kidney disease: Secondary | ICD-10-CM | POA: Diagnosis not present

## 2018-03-02 DIAGNOSIS — D509 Iron deficiency anemia, unspecified: Secondary | ICD-10-CM | POA: Diagnosis not present

## 2018-03-02 DIAGNOSIS — D631 Anemia in chronic kidney disease: Secondary | ICD-10-CM | POA: Diagnosis not present

## 2018-03-02 DIAGNOSIS — N2581 Secondary hyperparathyroidism of renal origin: Secondary | ICD-10-CM | POA: Diagnosis not present

## 2018-03-02 DIAGNOSIS — N186 End stage renal disease: Secondary | ICD-10-CM | POA: Diagnosis not present

## 2018-03-02 DIAGNOSIS — N184 Chronic kidney disease, stage 4 (severe): Secondary | ICD-10-CM | POA: Diagnosis not present

## 2018-03-04 DIAGNOSIS — D509 Iron deficiency anemia, unspecified: Secondary | ICD-10-CM | POA: Diagnosis not present

## 2018-03-04 DIAGNOSIS — N2581 Secondary hyperparathyroidism of renal origin: Secondary | ICD-10-CM | POA: Diagnosis not present

## 2018-03-04 DIAGNOSIS — D631 Anemia in chronic kidney disease: Secondary | ICD-10-CM | POA: Diagnosis not present

## 2018-03-04 DIAGNOSIS — N184 Chronic kidney disease, stage 4 (severe): Secondary | ICD-10-CM | POA: Diagnosis not present

## 2018-03-04 DIAGNOSIS — N186 End stage renal disease: Secondary | ICD-10-CM | POA: Diagnosis not present

## 2018-03-05 DIAGNOSIS — N2889 Other specified disorders of kidney and ureter: Secondary | ICD-10-CM | POA: Diagnosis not present

## 2018-03-05 DIAGNOSIS — Z992 Dependence on renal dialysis: Secondary | ICD-10-CM | POA: Diagnosis not present

## 2018-03-05 DIAGNOSIS — N186 End stage renal disease: Secondary | ICD-10-CM | POA: Diagnosis not present

## 2018-03-07 DIAGNOSIS — N2581 Secondary hyperparathyroidism of renal origin: Secondary | ICD-10-CM | POA: Diagnosis not present

## 2018-03-07 DIAGNOSIS — D631 Anemia in chronic kidney disease: Secondary | ICD-10-CM | POA: Diagnosis not present

## 2018-03-07 DIAGNOSIS — N184 Chronic kidney disease, stage 4 (severe): Secondary | ICD-10-CM | POA: Diagnosis not present

## 2018-03-07 DIAGNOSIS — N186 End stage renal disease: Secondary | ICD-10-CM | POA: Diagnosis not present

## 2018-03-07 DIAGNOSIS — D509 Iron deficiency anemia, unspecified: Secondary | ICD-10-CM | POA: Diagnosis not present

## 2018-03-09 DIAGNOSIS — N184 Chronic kidney disease, stage 4 (severe): Secondary | ICD-10-CM | POA: Diagnosis not present

## 2018-03-09 DIAGNOSIS — D631 Anemia in chronic kidney disease: Secondary | ICD-10-CM | POA: Diagnosis not present

## 2018-03-09 DIAGNOSIS — N2581 Secondary hyperparathyroidism of renal origin: Secondary | ICD-10-CM | POA: Diagnosis not present

## 2018-03-09 DIAGNOSIS — N186 End stage renal disease: Secondary | ICD-10-CM | POA: Diagnosis not present

## 2018-03-09 DIAGNOSIS — D509 Iron deficiency anemia, unspecified: Secondary | ICD-10-CM | POA: Diagnosis not present

## 2018-03-11 DIAGNOSIS — N184 Chronic kidney disease, stage 4 (severe): Secondary | ICD-10-CM | POA: Diagnosis not present

## 2018-03-11 DIAGNOSIS — N186 End stage renal disease: Secondary | ICD-10-CM | POA: Diagnosis not present

## 2018-03-11 DIAGNOSIS — D509 Iron deficiency anemia, unspecified: Secondary | ICD-10-CM | POA: Diagnosis not present

## 2018-03-11 DIAGNOSIS — D631 Anemia in chronic kidney disease: Secondary | ICD-10-CM | POA: Diagnosis not present

## 2018-03-11 DIAGNOSIS — N2581 Secondary hyperparathyroidism of renal origin: Secondary | ICD-10-CM | POA: Diagnosis not present

## 2018-03-14 DIAGNOSIS — N186 End stage renal disease: Secondary | ICD-10-CM | POA: Diagnosis not present

## 2018-03-14 DIAGNOSIS — N2581 Secondary hyperparathyroidism of renal origin: Secondary | ICD-10-CM | POA: Diagnosis not present

## 2018-03-14 DIAGNOSIS — N184 Chronic kidney disease, stage 4 (severe): Secondary | ICD-10-CM | POA: Diagnosis not present

## 2018-03-14 DIAGNOSIS — D509 Iron deficiency anemia, unspecified: Secondary | ICD-10-CM | POA: Diagnosis not present

## 2018-03-14 DIAGNOSIS — D631 Anemia in chronic kidney disease: Secondary | ICD-10-CM | POA: Diagnosis not present

## 2018-03-16 DIAGNOSIS — N184 Chronic kidney disease, stage 4 (severe): Secondary | ICD-10-CM | POA: Diagnosis not present

## 2018-03-16 DIAGNOSIS — N186 End stage renal disease: Secondary | ICD-10-CM | POA: Diagnosis not present

## 2018-03-16 DIAGNOSIS — D509 Iron deficiency anemia, unspecified: Secondary | ICD-10-CM | POA: Diagnosis not present

## 2018-03-16 DIAGNOSIS — D631 Anemia in chronic kidney disease: Secondary | ICD-10-CM | POA: Diagnosis not present

## 2018-03-16 DIAGNOSIS — N2581 Secondary hyperparathyroidism of renal origin: Secondary | ICD-10-CM | POA: Diagnosis not present

## 2018-03-18 DIAGNOSIS — D509 Iron deficiency anemia, unspecified: Secondary | ICD-10-CM | POA: Diagnosis not present

## 2018-03-18 DIAGNOSIS — D631 Anemia in chronic kidney disease: Secondary | ICD-10-CM | POA: Diagnosis not present

## 2018-03-18 DIAGNOSIS — N184 Chronic kidney disease, stage 4 (severe): Secondary | ICD-10-CM | POA: Diagnosis not present

## 2018-03-18 DIAGNOSIS — N186 End stage renal disease: Secondary | ICD-10-CM | POA: Diagnosis not present

## 2018-03-18 DIAGNOSIS — N2581 Secondary hyperparathyroidism of renal origin: Secondary | ICD-10-CM | POA: Diagnosis not present

## 2018-03-20 ENCOUNTER — Ambulatory Visit: Payer: Medicare Other | Admitting: Nurse Practitioner

## 2018-03-20 NOTE — Progress Notes (Deleted)
CARDIOLOGY OFFICE NOTE  Date:  03/20/2018    Eric [name] Date of Birth: September 10, 1930 Medical Record #[id number]  PCP:  G[facility]land [facility], DO  Cardiologist:  Servando Snare & ***    No chief complaint on file.   History of Present Illness: Eric [name] is a 85 y.o. male who presents today for a ***  Seen for Dr. Caryl Comes.   He has a history of ESRD on HD via AV fistula, insomnia, HTN, and GERD. Reported stress test at [facility] in [place], [place] 2 summers ago - noted in the record that he "has a problem with the one chamber of his heart not being attached properly and he knows about it since childhood". No actual documentation of this. Reports history of cardiac arrest approximately 2 years ago while having fistula procedure - sounds like he had a stress test at that time.   I saw himoriginally inNovember of 2016as a new patient for [facility] - had abnormal EKG and murmur on exam. Ended up getting echo, CT (negative for PE) and cath.   Cardiac cath on 11/27/2014 showed severe 3-vessel coronary disease. The proximal and mid LAD were heavily calcified with 80% proximal and 99% mid LAD stenosis followed by a 90% mid stenosis. The LCX had a 60% stenosis in a large OM2 and an occluded OM3 that long but small in diameter or underfilled by the collat from the LAD. The RCA is occluded distally with faint filling of a small diameter PDA branch by collat from the LAD. His echo showed at least moderate AS with a mean gradient of 36 mm Hg and a DI of 0.21. LVEF was reduced to 35% with anterior, septal and inferior wall motion abnormalities.   He underwent CABG x 3 with LIMA to LAD, SVG to OM2 and SVG to PDA with AVR with #23 Edwards Magna-Ease pericardial valve by Dr. Cyndia Bent on 12/09/2014. Nephrology assisted with his dialysis. He did initially require some inotropic support but this was able to be weaned over time without difficulty. He had no significant postoperative cardiac dysrhythmias. He  is on chronic Aranesp managed by the nephrologist.He has done well since - he did have lots of fatigue in 2018 - echo was updated and this turned out ok.  I have followed him since - seen last month - very fatigued - very hypotensive - was referred on to the ER by EMS. Wife with progressive dementia. Norvasc was stopped.   Comes back today. Here withhis wife.He did not drive here today. He feels better. Notes his fatigue has improved significantly. No chest pain. Breathing is good. Dry weight is a little higher. He feels like doing more except now his back is his most limiting factor. He is happy with how he is doing.   Patient and *** screened for recent travel, fever, URI symptoms and shortness of breath. Patient and *** deny travel over the last 14 days and are currently without symptoms.    Comes in today. Here with   Past Medical History:  Diagnosis Date  . Anxiety   . Arthritis   . Complication of anesthesia    " one time my heart stopped due to a medication that I was on."   . ESRD (end stage renal disease) on dialysis ([place])    Tues, Thursday, Saturday; [facility]; [place] (10/10/2017)  . GERD (gastroesophageal reflux disease)   . Gout   . Heart murmur    as a teen  .  Herpes zoster 04/2012  . Hypertension   . Shortness of breath dyspnea    with exertion    Past Surgical History:  Procedure Laterality Date  . AORTIC VALVE REPLACEMENT N/A 12/09/2014   Procedure: AORTIC VALVE REPLACEMENT (AVR) WITH 23MM MAGNA EASE BIOPROSTHETIC VALVE;  Surgeon: Gaye Pollack, MD;  Location: [facility] OR;  Service: Open Heart Surgery;  Laterality: N/A;  . APPENDECTOMY  1944  . AV FISTULA PLACEMENT Left    Left Cimino AVF placed in [place]   . AV FISTULA PLACEMENT Right 03/06/2014   Procedure: ARTERIOVENOUS (AV) FISTULA CREATION-right radiocephalic;  Surgeon: Mal Misty, MD;  Location: [facility];  Service: Vascular;  Laterality: Right;  . AV FISTULA PLACEMENT Right 07/15/2014    Procedure: ARTERIOVENOUS (AV) FISTULA CREATION;  Surgeon: Elam Dutch, MD;  Location: [facility];  Service: Vascular;  Laterality: Right;  . CARDIAC CATHETERIZATION N/A 11/27/2014   Procedure: Right/Left Heart Cath and Coronary Angiography;  Surgeon: Burnell Blanks, MD;  Location: [facility];  Service: Cardiovascular;  Laterality: N/A;  . CATARACT EXTRACTION W/ INTRAOCULAR LENS IMPLANT Bilateral 2014   [facility]  . COLONOSCOPY  2013   Dr. Annamaria Helling [place], [place].  . CORONARY ARTERY BYPASS GRAFT N/A 12/09/2014   Procedure: CORONARY ARTERY BYPASS GRAFTING (CABG), ON PUMP, TIMES THREE, USING LEFT INTERNAL MAMMARY ARTERY, RIGHT GREATER SAPHENOUS VEIN HARVESTED ENDOSCOPICALLY;  Surgeon: Gaye Pollack, MD;  Location: [facility];  Service: Open Heart Surgery;  Laterality: N/A;  LIMA-LAD; SVG-OM; SVG-PD  . DIALYSIS FISTULA CREATION  08/04/2012 and 10/13   Dr. Harden Mo  . INSERTION OF DIALYSIS CATHETER Right 01-15-14   Right chest TDC placed by Dr. Augustin Coupe at [facility]  . LIGATION OF ARTERIOVENOUS  FISTULA Right 07/15/2014   Procedure: LIGATION OF ARTERIOVENOUS  FISTULA  (RIGHT RADIOCEPHALIC);  Surgeon: Elam Dutch, MD;  Location: [facility];  Service: Vascular;  Laterality: Right;  . LIGATION OF COMPETING BRANCHES OF ARTERIOVENOUS FISTULA Right 05/08/2014   Procedure: LIGATION OF COMPETING BRANCHES OF RIGHT ARM RADIOCEPHALIC ARTERIOVENOUS FISTULA;  Surgeon: Mal Misty, MD;  Location: [facility];  Service: Vascular;  Laterality: Right;  . REVISON OF ARTERIOVENOUS FISTULA Right 07/15/2014   Procedure: EXPLORATION OF ARTERIOVENOUS FISTULA;  Surgeon: Elam Dutch, MD;  Location: [facility];  Service: Vascular;  Laterality: Right;  . TEE WITHOUT CARDIOVERSION N/A 12/09/2014   Procedure: TRANSESOPHAGEAL ECHOCARDIOGRAM (TEE);  Surgeon: Gaye Pollack, MD;  Location: [facility];  Service: Open Heart Surgery;  Laterality: N/A;     Medications: No outpatient medications have been marked as taking for  the 03/20/18 encounter (Appointment) with Burtis Junes, NP.     Allergies: No Known Allergies  Social History: The patient  reports that he has never smoked. He has never used smokeless tobacco. He reports current alcohol use of about 7.0 standard drinks of alcohol per week. He reports that he does not use drugs.   Family History: The patient's ***family history includes Cancer in his mother; Diabetes in his father; Lung cancer (age of onset: 64) in his father.   Review of Systems: Please see the history of present illness.   Otherwise, the review of systems is positive for {NONE DEFAULTED:18576::"none"}.   All other systems are reviewed and negative.   Physical Exam: VS:  There were no vitals taken for this visit. Marland Kitchen  BMI There is no height or weight on file to calculate BMI.  Wt Readings from Last 3  Encounters:  02/18/18 175 lb (79.4 kg)  01/16/18 183 lb (83 kg)  11/16/17 179 lb 3.2 oz (81.3 kg)    General: Pleasant. Well developed, well nourished and in no acute distress.   HEENT: Normal.  Neck: Supple, no JVD, carotid bruits, or masses noted.  Cardiac: ***Regular rate and rhythm. No murmurs, rubs, or gallops. No edema.  Respiratory:  Lungs are clear to auscultation bilaterally with normal work of breathing.  GI: Soft and nontender.  MS: No deformity or atrophy. Gait and ROM intact.  Skin: Warm and dry. Color is normal.  Neuro:  Strength and sensation are intact and no gross focal deficits noted.  Psych: Alert, appropriate and with normal affect.   LABORATORY DATA:  EKG:  EKG {ACTION; IS/IS [id number]} ordered today. This demonstrates ***.  Lab Results  Component Value Date   WBC 7.2 10/11/2017   HGB 10.4 (L) 10/11/2017   HCT 31.1 (L) 10/11/2017   PLT 145 (L) 10/11/2017   GLUCOSE 94 10/11/2017   CHOL 125 02/07/2017   TRIG 80 02/07/2017   HDL 37 (L) 02/07/2017   LDLCALC 72 02/07/2017   ALT 12 10/10/2017   AST 9 (L) 10/10/2017   NA 136 10/11/2017   K 4.0  10/11/2017   CL 98 10/11/2017   CREATININE 6.21 (H) 10/11/2017   BUN 32 (H) 10/11/2017   CO2 27 10/11/2017   INR 1.42 12/09/2014   HGBA1C 5.2 12/06/2014     BNP (last 3 results) No results for input(s): BNP in the last 8760 hours.  ProBNP (last 3 results) No results for input(s): PROBNP in the last 8760 hours.   Other Studies Reviewed Today:   Assessment/Plan: EchoStudy Conclusions6/2018  - Procedure narrative: Transthoracic echocardiography. Image quality was adequate. Intravenous contrast (Definity) was administered. - Left ventricle: The cavity size was normal. Wall thickness was increased in a pattern of moderate LVH. Systolic function was moderately reduced. The estimated ejection fraction was in the range of 35% to 40%. Akinesis of the mid-apicalanteroseptal myocardium. Doppler parameters are consistent with abnormal left ventricular relaxation (grade 1 diastolic dysfunction). - Aortic valve: A bioprosthesis was present. There was trivial regurgitation. - Mitral valve: There was mild regurgitation. - Right atrium: The atrium was mildly dilated.    Assessment/Plan:  1.Prior episode of hypotension - now improved. No longer on Norvasc. He is feeling better clinically. His fatigue has improved. No further changes made today. I have left him on his Lopressor. We both agree that further testing/evaluation is not warranted at this time.   2. CAD/AS - prior CABG/AVR in 2016. No active symptoms. Last echo from 06/2016 - will consider updating on return.   3. LV dysfunction - not short of breath - managed with dialysis.   4. ESRD - on dialysis-dry weight a little higher now.   5. Chronic Anemia - remains on Aranesp - managed by Renal. Not discussed.   6. HLD - on statin therapy.  7. Chronic LBBB   Current medicines are reviewed with the patient today.  The patient does not have concerns regarding medicines other than what has been  noted above.  The following changes have been made:  See above.  Labs/ tests ordered today include:   No orders of the defined types were placed in this encounter.    Disposition:   FU with *** in {gen number [id number]} {Days to years:10300}.   Patient is agreeable to this plan and will call if any problems develop in the interim.  SignedTruitt Merle, NP  03/20/2018 1:31 PM  [facility] [address] [place], [place]  [address] Phone: [phone] Fax: [phone]

## 2018-03-21 DIAGNOSIS — N186 End stage renal disease: Secondary | ICD-10-CM | POA: Diagnosis not present

## 2018-03-21 DIAGNOSIS — D631 Anemia in chronic kidney disease: Secondary | ICD-10-CM | POA: Diagnosis not present

## 2018-03-21 DIAGNOSIS — N2581 Secondary hyperparathyroidism of renal origin: Secondary | ICD-10-CM | POA: Diagnosis not present

## 2018-03-21 DIAGNOSIS — N184 Chronic kidney disease, stage 4 (severe): Secondary | ICD-10-CM | POA: Diagnosis not present

## 2018-03-21 DIAGNOSIS — D509 Iron deficiency anemia, unspecified: Secondary | ICD-10-CM | POA: Diagnosis not present

## 2018-03-23 DIAGNOSIS — D509 Iron deficiency anemia, unspecified: Secondary | ICD-10-CM | POA: Diagnosis not present

## 2018-03-23 DIAGNOSIS — N186 End stage renal disease: Secondary | ICD-10-CM | POA: Diagnosis not present

## 2018-03-23 DIAGNOSIS — N184 Chronic kidney disease, stage 4 (severe): Secondary | ICD-10-CM | POA: Diagnosis not present

## 2018-03-23 DIAGNOSIS — D631 Anemia in chronic kidney disease: Secondary | ICD-10-CM | POA: Diagnosis not present

## 2018-03-23 DIAGNOSIS — N2581 Secondary hyperparathyroidism of renal origin: Secondary | ICD-10-CM | POA: Diagnosis not present

## 2018-03-25 DIAGNOSIS — N2581 Secondary hyperparathyroidism of renal origin: Secondary | ICD-10-CM | POA: Diagnosis not present

## 2018-03-25 DIAGNOSIS — D631 Anemia in chronic kidney disease: Secondary | ICD-10-CM | POA: Diagnosis not present

## 2018-03-25 DIAGNOSIS — D509 Iron deficiency anemia, unspecified: Secondary | ICD-10-CM | POA: Diagnosis not present

## 2018-03-25 DIAGNOSIS — N186 End stage renal disease: Secondary | ICD-10-CM | POA: Diagnosis not present

## 2018-03-25 DIAGNOSIS — N184 Chronic kidney disease, stage 4 (severe): Secondary | ICD-10-CM | POA: Diagnosis not present

## 2018-03-28 DIAGNOSIS — N186 End stage renal disease: Secondary | ICD-10-CM | POA: Diagnosis not present

## 2018-03-28 DIAGNOSIS — D631 Anemia in chronic kidney disease: Secondary | ICD-10-CM | POA: Diagnosis not present

## 2018-03-28 DIAGNOSIS — N184 Chronic kidney disease, stage 4 (severe): Secondary | ICD-10-CM | POA: Diagnosis not present

## 2018-03-28 DIAGNOSIS — N2581 Secondary hyperparathyroidism of renal origin: Secondary | ICD-10-CM | POA: Diagnosis not present

## 2018-03-28 DIAGNOSIS — D509 Iron deficiency anemia, unspecified: Secondary | ICD-10-CM | POA: Diagnosis not present

## 2018-03-30 DIAGNOSIS — N2581 Secondary hyperparathyroidism of renal origin: Secondary | ICD-10-CM | POA: Diagnosis not present

## 2018-03-30 DIAGNOSIS — N186 End stage renal disease: Secondary | ICD-10-CM | POA: Diagnosis not present

## 2018-03-30 DIAGNOSIS — N184 Chronic kidney disease, stage 4 (severe): Secondary | ICD-10-CM | POA: Diagnosis not present

## 2018-03-30 DIAGNOSIS — D509 Iron deficiency anemia, unspecified: Secondary | ICD-10-CM | POA: Diagnosis not present

## 2018-03-30 DIAGNOSIS — D631 Anemia in chronic kidney disease: Secondary | ICD-10-CM | POA: Diagnosis not present

## 2018-04-01 DIAGNOSIS — D631 Anemia in chronic kidney disease: Secondary | ICD-10-CM | POA: Diagnosis not present

## 2018-04-01 DIAGNOSIS — N2581 Secondary hyperparathyroidism of renal origin: Secondary | ICD-10-CM | POA: Diagnosis not present

## 2018-04-01 DIAGNOSIS — N186 End stage renal disease: Secondary | ICD-10-CM | POA: Diagnosis not present

## 2018-04-01 DIAGNOSIS — D509 Iron deficiency anemia, unspecified: Secondary | ICD-10-CM | POA: Diagnosis not present

## 2018-04-01 DIAGNOSIS — N184 Chronic kidney disease, stage 4 (severe): Secondary | ICD-10-CM | POA: Diagnosis not present

## 2018-04-04 DIAGNOSIS — N184 Chronic kidney disease, stage 4 (severe): Secondary | ICD-10-CM | POA: Diagnosis not present

## 2018-04-04 DIAGNOSIS — D631 Anemia in chronic kidney disease: Secondary | ICD-10-CM | POA: Diagnosis not present

## 2018-04-04 DIAGNOSIS — N186 End stage renal disease: Secondary | ICD-10-CM | POA: Diagnosis not present

## 2018-04-04 DIAGNOSIS — D509 Iron deficiency anemia, unspecified: Secondary | ICD-10-CM | POA: Diagnosis not present

## 2018-04-04 DIAGNOSIS — N2581 Secondary hyperparathyroidism of renal origin: Secondary | ICD-10-CM | POA: Diagnosis not present

## 2018-04-05 DIAGNOSIS — Z992 Dependence on renal dialysis: Secondary | ICD-10-CM | POA: Diagnosis not present

## 2018-04-05 DIAGNOSIS — N2889 Other specified disorders of kidney and ureter: Secondary | ICD-10-CM | POA: Diagnosis not present

## 2018-04-05 DIAGNOSIS — N186 End stage renal disease: Secondary | ICD-10-CM | POA: Diagnosis not present

## 2018-04-06 DIAGNOSIS — N186 End stage renal disease: Secondary | ICD-10-CM | POA: Diagnosis not present

## 2018-04-06 DIAGNOSIS — D509 Iron deficiency anemia, unspecified: Secondary | ICD-10-CM | POA: Diagnosis not present

## 2018-04-06 DIAGNOSIS — N184 Chronic kidney disease, stage 4 (severe): Secondary | ICD-10-CM | POA: Diagnosis not present

## 2018-04-06 DIAGNOSIS — D631 Anemia in chronic kidney disease: Secondary | ICD-10-CM | POA: Diagnosis not present

## 2018-04-06 DIAGNOSIS — N2581 Secondary hyperparathyroidism of renal origin: Secondary | ICD-10-CM | POA: Diagnosis not present

## 2018-04-08 DIAGNOSIS — N184 Chronic kidney disease, stage 4 (severe): Secondary | ICD-10-CM | POA: Diagnosis not present

## 2018-04-08 DIAGNOSIS — N186 End stage renal disease: Secondary | ICD-10-CM | POA: Diagnosis not present

## 2018-04-08 DIAGNOSIS — N2581 Secondary hyperparathyroidism of renal origin: Secondary | ICD-10-CM | POA: Diagnosis not present

## 2018-04-08 DIAGNOSIS — D631 Anemia in chronic kidney disease: Secondary | ICD-10-CM | POA: Diagnosis not present

## 2018-04-08 DIAGNOSIS — D509 Iron deficiency anemia, unspecified: Secondary | ICD-10-CM | POA: Diagnosis not present

## 2018-04-10 ENCOUNTER — Other Ambulatory Visit: Payer: Self-pay | Admitting: Internal Medicine

## 2018-04-11 DIAGNOSIS — N2581 Secondary hyperparathyroidism of renal origin: Secondary | ICD-10-CM | POA: Diagnosis not present

## 2018-04-11 DIAGNOSIS — D509 Iron deficiency anemia, unspecified: Secondary | ICD-10-CM | POA: Diagnosis not present

## 2018-04-11 DIAGNOSIS — N186 End stage renal disease: Secondary | ICD-10-CM | POA: Diagnosis not present

## 2018-04-11 DIAGNOSIS — D631 Anemia in chronic kidney disease: Secondary | ICD-10-CM | POA: Diagnosis not present

## 2018-04-11 DIAGNOSIS — N184 Chronic kidney disease, stage 4 (severe): Secondary | ICD-10-CM | POA: Diagnosis not present

## 2018-04-13 DIAGNOSIS — D509 Iron deficiency anemia, unspecified: Secondary | ICD-10-CM | POA: Diagnosis not present

## 2018-04-13 DIAGNOSIS — N186 End stage renal disease: Secondary | ICD-10-CM | POA: Diagnosis not present

## 2018-04-13 DIAGNOSIS — N184 Chronic kidney disease, stage 4 (severe): Secondary | ICD-10-CM | POA: Diagnosis not present

## 2018-04-13 DIAGNOSIS — N2581 Secondary hyperparathyroidism of renal origin: Secondary | ICD-10-CM | POA: Diagnosis not present

## 2018-04-13 DIAGNOSIS — D631 Anemia in chronic kidney disease: Secondary | ICD-10-CM | POA: Diagnosis not present

## 2018-04-15 DIAGNOSIS — D631 Anemia in chronic kidney disease: Secondary | ICD-10-CM | POA: Diagnosis not present

## 2018-04-15 DIAGNOSIS — D509 Iron deficiency anemia, unspecified: Secondary | ICD-10-CM | POA: Diagnosis not present

## 2018-04-15 DIAGNOSIS — N186 End stage renal disease: Secondary | ICD-10-CM | POA: Diagnosis not present

## 2018-04-15 DIAGNOSIS — N2581 Secondary hyperparathyroidism of renal origin: Secondary | ICD-10-CM | POA: Diagnosis not present

## 2018-04-15 DIAGNOSIS — N184 Chronic kidney disease, stage 4 (severe): Secondary | ICD-10-CM | POA: Diagnosis not present

## 2018-04-18 DIAGNOSIS — N2581 Secondary hyperparathyroidism of renal origin: Secondary | ICD-10-CM | POA: Diagnosis not present

## 2018-04-18 DIAGNOSIS — N186 End stage renal disease: Secondary | ICD-10-CM | POA: Diagnosis not present

## 2018-04-18 DIAGNOSIS — D509 Iron deficiency anemia, unspecified: Secondary | ICD-10-CM | POA: Diagnosis not present

## 2018-04-18 DIAGNOSIS — N184 Chronic kidney disease, stage 4 (severe): Secondary | ICD-10-CM | POA: Diagnosis not present

## 2018-04-18 DIAGNOSIS — D631 Anemia in chronic kidney disease: Secondary | ICD-10-CM | POA: Diagnosis not present

## 2018-04-20 DIAGNOSIS — D509 Iron deficiency anemia, unspecified: Secondary | ICD-10-CM | POA: Diagnosis not present

## 2018-04-20 DIAGNOSIS — N2581 Secondary hyperparathyroidism of renal origin: Secondary | ICD-10-CM | POA: Diagnosis not present

## 2018-04-20 DIAGNOSIS — D631 Anemia in chronic kidney disease: Secondary | ICD-10-CM | POA: Diagnosis not present

## 2018-04-20 DIAGNOSIS — N186 End stage renal disease: Secondary | ICD-10-CM | POA: Diagnosis not present

## 2018-04-20 DIAGNOSIS — N184 Chronic kidney disease, stage 4 (severe): Secondary | ICD-10-CM | POA: Diagnosis not present

## 2018-04-22 DIAGNOSIS — N186 End stage renal disease: Secondary | ICD-10-CM | POA: Diagnosis not present

## 2018-04-22 DIAGNOSIS — N2581 Secondary hyperparathyroidism of renal origin: Secondary | ICD-10-CM | POA: Diagnosis not present

## 2018-04-22 DIAGNOSIS — D631 Anemia in chronic kidney disease: Secondary | ICD-10-CM | POA: Diagnosis not present

## 2018-04-22 DIAGNOSIS — N184 Chronic kidney disease, stage 4 (severe): Secondary | ICD-10-CM | POA: Diagnosis not present

## 2018-04-22 DIAGNOSIS — D509 Iron deficiency anemia, unspecified: Secondary | ICD-10-CM | POA: Diagnosis not present

## 2018-04-25 DIAGNOSIS — N186 End stage renal disease: Secondary | ICD-10-CM | POA: Diagnosis not present

## 2018-04-25 DIAGNOSIS — D631 Anemia in chronic kidney disease: Secondary | ICD-10-CM | POA: Diagnosis not present

## 2018-04-25 DIAGNOSIS — N2581 Secondary hyperparathyroidism of renal origin: Secondary | ICD-10-CM | POA: Diagnosis not present

## 2018-04-25 DIAGNOSIS — N184 Chronic kidney disease, stage 4 (severe): Secondary | ICD-10-CM | POA: Diagnosis not present

## 2018-04-25 DIAGNOSIS — D509 Iron deficiency anemia, unspecified: Secondary | ICD-10-CM | POA: Diagnosis not present

## 2018-04-27 DIAGNOSIS — N186 End stage renal disease: Secondary | ICD-10-CM | POA: Diagnosis not present

## 2018-04-27 DIAGNOSIS — N184 Chronic kidney disease, stage 4 (severe): Secondary | ICD-10-CM | POA: Diagnosis not present

## 2018-04-27 DIAGNOSIS — N2581 Secondary hyperparathyroidism of renal origin: Secondary | ICD-10-CM | POA: Diagnosis not present

## 2018-04-27 DIAGNOSIS — D631 Anemia in chronic kidney disease: Secondary | ICD-10-CM | POA: Diagnosis not present

## 2018-04-27 DIAGNOSIS — D509 Iron deficiency anemia, unspecified: Secondary | ICD-10-CM | POA: Diagnosis not present

## 2018-04-29 DIAGNOSIS — N186 End stage renal disease: Secondary | ICD-10-CM | POA: Diagnosis not present

## 2018-04-29 DIAGNOSIS — N2581 Secondary hyperparathyroidism of renal origin: Secondary | ICD-10-CM | POA: Diagnosis not present

## 2018-04-29 DIAGNOSIS — D631 Anemia in chronic kidney disease: Secondary | ICD-10-CM | POA: Diagnosis not present

## 2018-04-29 DIAGNOSIS — D509 Iron deficiency anemia, unspecified: Secondary | ICD-10-CM | POA: Diagnosis not present

## 2018-04-29 DIAGNOSIS — N184 Chronic kidney disease, stage 4 (severe): Secondary | ICD-10-CM | POA: Diagnosis not present

## 2018-04-30 ENCOUNTER — Encounter: Payer: Self-pay | Admitting: Internal Medicine

## 2018-05-02 DIAGNOSIS — N186 End stage renal disease: Secondary | ICD-10-CM | POA: Diagnosis not present

## 2018-05-02 DIAGNOSIS — N2581 Secondary hyperparathyroidism of renal origin: Secondary | ICD-10-CM | POA: Diagnosis not present

## 2018-05-02 DIAGNOSIS — N184 Chronic kidney disease, stage 4 (severe): Secondary | ICD-10-CM | POA: Diagnosis not present

## 2018-05-02 DIAGNOSIS — D631 Anemia in chronic kidney disease: Secondary | ICD-10-CM | POA: Diagnosis not present

## 2018-05-02 DIAGNOSIS — D509 Iron deficiency anemia, unspecified: Secondary | ICD-10-CM | POA: Diagnosis not present

## 2018-05-04 DIAGNOSIS — N184 Chronic kidney disease, stage 4 (severe): Secondary | ICD-10-CM | POA: Diagnosis not present

## 2018-05-04 DIAGNOSIS — D631 Anemia in chronic kidney disease: Secondary | ICD-10-CM | POA: Diagnosis not present

## 2018-05-04 DIAGNOSIS — D509 Iron deficiency anemia, unspecified: Secondary | ICD-10-CM | POA: Diagnosis not present

## 2018-05-04 DIAGNOSIS — N186 End stage renal disease: Secondary | ICD-10-CM | POA: Diagnosis not present

## 2018-05-04 DIAGNOSIS — N2581 Secondary hyperparathyroidism of renal origin: Secondary | ICD-10-CM | POA: Diagnosis not present

## 2018-05-05 DIAGNOSIS — N2889 Other specified disorders of kidney and ureter: Secondary | ICD-10-CM | POA: Diagnosis not present

## 2018-05-05 DIAGNOSIS — N186 End stage renal disease: Secondary | ICD-10-CM | POA: Diagnosis not present

## 2018-05-05 DIAGNOSIS — Z992 Dependence on renal dialysis: Secondary | ICD-10-CM | POA: Diagnosis not present

## 2018-05-06 DIAGNOSIS — D631 Anemia in chronic kidney disease: Secondary | ICD-10-CM | POA: Diagnosis not present

## 2018-05-06 DIAGNOSIS — N186 End stage renal disease: Secondary | ICD-10-CM | POA: Diagnosis not present

## 2018-05-06 DIAGNOSIS — N184 Chronic kidney disease, stage 4 (severe): Secondary | ICD-10-CM | POA: Diagnosis not present

## 2018-05-06 DIAGNOSIS — N2581 Secondary hyperparathyroidism of renal origin: Secondary | ICD-10-CM | POA: Diagnosis not present

## 2018-05-06 DIAGNOSIS — D509 Iron deficiency anemia, unspecified: Secondary | ICD-10-CM | POA: Diagnosis not present

## 2018-05-09 DIAGNOSIS — N186 End stage renal disease: Secondary | ICD-10-CM | POA: Diagnosis not present

## 2018-05-09 DIAGNOSIS — N184 Chronic kidney disease, stage 4 (severe): Secondary | ICD-10-CM | POA: Diagnosis not present

## 2018-05-09 DIAGNOSIS — N2581 Secondary hyperparathyroidism of renal origin: Secondary | ICD-10-CM | POA: Diagnosis not present

## 2018-05-09 DIAGNOSIS — D509 Iron deficiency anemia, unspecified: Secondary | ICD-10-CM | POA: Diagnosis not present

## 2018-05-09 DIAGNOSIS — D631 Anemia in chronic kidney disease: Secondary | ICD-10-CM | POA: Diagnosis not present

## 2018-05-11 DIAGNOSIS — D509 Iron deficiency anemia, unspecified: Secondary | ICD-10-CM | POA: Diagnosis not present

## 2018-05-11 DIAGNOSIS — N184 Chronic kidney disease, stage 4 (severe): Secondary | ICD-10-CM | POA: Diagnosis not present

## 2018-05-11 DIAGNOSIS — N2581 Secondary hyperparathyroidism of renal origin: Secondary | ICD-10-CM | POA: Diagnosis not present

## 2018-05-11 DIAGNOSIS — D631 Anemia in chronic kidney disease: Secondary | ICD-10-CM | POA: Diagnosis not present

## 2018-05-11 DIAGNOSIS — N186 End stage renal disease: Secondary | ICD-10-CM | POA: Diagnosis not present

## 2018-05-13 DIAGNOSIS — D509 Iron deficiency anemia, unspecified: Secondary | ICD-10-CM | POA: Diagnosis not present

## 2018-05-13 DIAGNOSIS — N2581 Secondary hyperparathyroidism of renal origin: Secondary | ICD-10-CM | POA: Diagnosis not present

## 2018-05-13 DIAGNOSIS — N186 End stage renal disease: Secondary | ICD-10-CM | POA: Diagnosis not present

## 2018-05-13 DIAGNOSIS — N184 Chronic kidney disease, stage 4 (severe): Secondary | ICD-10-CM | POA: Diagnosis not present

## 2018-05-13 DIAGNOSIS — D631 Anemia in chronic kidney disease: Secondary | ICD-10-CM | POA: Diagnosis not present

## 2018-05-16 DIAGNOSIS — D631 Anemia in chronic kidney disease: Secondary | ICD-10-CM | POA: Diagnosis not present

## 2018-05-16 DIAGNOSIS — N184 Chronic kidney disease, stage 4 (severe): Secondary | ICD-10-CM | POA: Diagnosis not present

## 2018-05-16 DIAGNOSIS — N186 End stage renal disease: Secondary | ICD-10-CM | POA: Diagnosis not present

## 2018-05-16 DIAGNOSIS — N2581 Secondary hyperparathyroidism of renal origin: Secondary | ICD-10-CM | POA: Diagnosis not present

## 2018-05-16 DIAGNOSIS — D509 Iron deficiency anemia, unspecified: Secondary | ICD-10-CM | POA: Diagnosis not present

## 2018-05-17 ENCOUNTER — Other Ambulatory Visit: Payer: Self-pay

## 2018-05-17 ENCOUNTER — Other Ambulatory Visit: Payer: Self-pay | Admitting: Nurse Practitioner

## 2018-05-17 ENCOUNTER — Encounter: Payer: Self-pay | Admitting: Internal Medicine

## 2018-05-17 ENCOUNTER — Non-Acute Institutional Stay: Payer: Medicare Other | Admitting: Internal Medicine

## 2018-05-17 VITALS — BP 120/70 | HR 71 | Temp 97.5°F | Ht 69.0 in | Wt 167.0 lb

## 2018-05-17 DIAGNOSIS — N2581 Secondary hyperparathyroidism of renal origin: Secondary | ICD-10-CM

## 2018-05-17 DIAGNOSIS — I428 Other cardiomyopathies: Secondary | ICD-10-CM | POA: Diagnosis not present

## 2018-05-17 DIAGNOSIS — I429 Cardiomyopathy, unspecified: Secondary | ICD-10-CM

## 2018-05-17 DIAGNOSIS — I770 Arteriovenous fistula, acquired: Secondary | ICD-10-CM | POA: Diagnosis not present

## 2018-05-17 DIAGNOSIS — I25110 Atherosclerotic heart disease of native coronary artery with unstable angina pectoris: Secondary | ICD-10-CM

## 2018-05-17 DIAGNOSIS — J9611 Chronic respiratory failure with hypoxia: Secondary | ICD-10-CM | POA: Diagnosis not present

## 2018-05-17 DIAGNOSIS — R0609 Other forms of dyspnea: Secondary | ICD-10-CM

## 2018-05-17 DIAGNOSIS — Z992 Dependence on renal dialysis: Secondary | ICD-10-CM | POA: Diagnosis not present

## 2018-05-17 DIAGNOSIS — R0600 Dyspnea, unspecified: Secondary | ICD-10-CM

## 2018-05-17 DIAGNOSIS — N186 End stage renal disease: Secondary | ICD-10-CM

## 2018-05-17 NOTE — Progress Notes (Signed)
Location:  Occupational psychologist of Service:  Clinic (12)  Provider: Kazia Grisanti L. Mariea Clonts, D.O., C.M.D.  Goals of Care:  Need to have a discussion about this with MOST form  Advanced Directives 02/18/2018  Does Patient Have a Medical Advance Directive? No;Yes  Type of Paramedic of [place];Living will  Does patient want to make changes to medical advance directive? -  Copy of [place] in Chart? -  Would patient like information on creating a medical advance directive? -  Pre-existing out of facility DNR order (yellow form or pink MOST form) -     Chief Complaint  Patient presents with  . Medical Management of Chronic Issues    16mth follow-up    HPI: Patient is a 85 y.o. male seen today for medical management of chronic diseases.  Not doing well.    He is wearing the oxygen now on Monday nights.  He has a wedge pillow which also helps.  Getting ready to get out to HD.  Has to stop 2-3 times just getting to the elevator and then has to stop again on way to [place].  He uses the wedge nightly.  Does not wake up short of breath except mon into Tues am when there's the extra day before HD.  When he wears his shoes and socks he's fine.  When he takes off is shoes and socks at night, his little toes are numb.  It's not affected by his shoe choice.  No new pains otherwise in his back or legs.  No discoloration or changes in appearance of toes.    He's not able to walk anywhere near where he could a year or two ago.  He'd gotten to where he could walk around the whole [facility] after his heart attack, but cannot now as above.  No chest pains or any pains.  His appetite is not as good.  He does eat three meals.  Sometimes skips breakfast on HD days.For dinner, he eats about half of his food.  Finishes 100% of lunch which is tub of yogurt, half a bagel and cheese.    Weight was 183 in jan and now 167 lbs.    He  sleeps all through dialysis most of the time.  He used to read challenging books.  He realizes that his renal failure is taking more of a toll.  He used to run errands after his HD.  Now he naps before lunch after HD before doing anything.    Past Medical History:  Diagnosis Date  . Anxiety   . Arthritis   . Complication of anesthesia    " one time my heart stopped due to a medication that I was on."   . ESRD (end stage renal disease) on dialysis ([place])    Tues, Thursday, Saturday; [facility]; [facility] (10/10/2017)  . GERD (gastroesophageal reflux disease)   . Gout   . Heart murmur    as a teen  . Herpes zoster 04/2012  . Hypertension   . Shortness of breath dyspnea    with exertion    Past Surgical History:  Procedure Laterality Date  . AORTIC VALVE REPLACEMENT N/A 12/09/2014   Procedure: AORTIC VALVE REPLACEMENT (AVR) WITH 23MM MAGNA EASE BIOPROSTHETIC VALVE;  Surgeon: Gaye Pollack, MD;  Location: [place] [facility] OR;  Service: Open Heart Surgery;  Laterality: N/A;  . APPENDECTOMY  1944  . AV FISTULA PLACEMENT Left  Left Cimino AVF placed in [place]   . AV FISTULA PLACEMENT Right 03/06/2014   Procedure: ARTERIOVENOUS (AV) FISTULA CREATION-right radiocephalic;  Surgeon: Mal Misty, MD;  Location: [facility] OR;  Service: Vascular;  Laterality: Right;  . AV FISTULA PLACEMENT Right 07/15/2014   Procedure: ARTERIOVENOUS (AV) FISTULA CREATION;  Surgeon: Elam Dutch, MD;  Location: [facility];  Service: Vascular;  Laterality: Right;  . CARDIAC CATHETERIZATION N/A 11/27/2014   Procedure: Right/Left Heart Cath and Coronary Angiography;  Surgeon: Burnell Blanks, MD;  Location: [place] [facility] CV LAB;  Service: Cardiovascular;  Laterality: N/A;  . CATARACT EXTRACTION W/ INTRAOCULAR LENS IMPLANT Bilateral 2014   [facility]  . COLONOSCOPY  2013   Dr. Annamaria Helling [place], [place].  . CORONARY ARTERY BYPASS GRAFT N/A 12/09/2014   Procedure: CORONARY ARTERY BYPASS GRAFTING (CABG), ON PUMP, TIMES  THREE, USING LEFT INTERNAL MAMMARY ARTERY, RIGHT GREATER SAPHENOUS VEIN HARVESTED ENDOSCOPICALLY;  Surgeon: Gaye Pollack, MD;  Location: [facility];  Service: Open Heart Surgery;  Laterality: N/A;  LIMA-LAD; SVG-OM; SVG-PD  . DIALYSIS FISTULA CREATION  08/04/2012 and 10/13   Dr. Harden Mo  . INSERTION OF DIALYSIS CATHETER Right 01-15-14   Right chest TDC placed by Dr. Augustin Coupe at [facility]  . LIGATION OF ARTERIOVENOUS  FISTULA Right 07/15/2014   Procedure: LIGATION OF ARTERIOVENOUS  FISTULA  (RIGHT RADIOCEPHALIC);  Surgeon: Elam Dutch, MD;  Location: [place] [facility];  Service: Vascular;  Laterality: Right;  . LIGATION OF COMPETING BRANCHES OF ARTERIOVENOUS FISTULA Right 05/08/2014   Procedure: LIGATION OF COMPETING BRANCHES OF RIGHT ARM RADIOCEPHALIC ARTERIOVENOUS FISTULA;  Surgeon: Mal Misty, MD;  Location: [place];  Service: Vascular;  Laterality: Right;  . REVISON OF ARTERIOVENOUS FISTULA Right 07/15/2014   Procedure: EXPLORATION OF ARTERIOVENOUS FISTULA;  Surgeon: Elam Dutch, MD;  Location: [place];  Service: Vascular;  Laterality: Right;  . TEE WITHOUT CARDIOVERSION N/A 12/09/2014   Procedure: TRANSESOPHAGEAL ECHOCARDIOGRAM (TEE);  Surgeon: Gaye Pollack, MD;  Location: [place];  Service: Open Heart Surgery;  Laterality: N/A;    No Known Allergies  Outpatient Encounter Medications as of 05/17/2018  Medication Sig  . allopurinol (ZYLOPRIM) 100 MG tablet Take 100 mg by mouth daily.  Marland Kitchen atorvastatin (LIPITOR) 20 MG tablet Take 20 mg by mouth daily at 6 PM.  . calcium carbonate (TUMS - DOSED IN MG ELEMENTAL CALCIUM) 500 MG chewable tablet Chew 2 tablets by mouth daily as needed for indigestion.   . cinacalcet (SENSIPAR) 60 MG tablet Take 60 mg by mouth every Monday, Wednesday, Friday, and Saturday at 6 PM.   . metoprolol tartrate (LOPRESSOR) 25 MG tablet TAKE (1/2) TABLET TWICE DAILY.  Marland Kitchen omeprazole (PRILOSEC) 20 MG capsule Take 20 mg by mouth daily.   . [DISCONTINUED] loratadine (CLARITIN) 10 MG  tablet Take 10 mg daily by mouth.   No facility-administered encounter medications on file as of 05/17/2018.     Review of Systems:  Review of Systems  Constitutional: Positive for malaise/fatigue and weight loss. Negative for chills, diaphoresis and fever.  HENT: Positive for hearing loss.   Eyes: Negative for blurred vision.  Respiratory: Positive for shortness of breath. Negative for cough, sputum production and wheezing.   Cardiovascular: Positive for orthopnea. Negative for chest pain, palpitations, claudication, leg swelling and PND.  Gastrointestinal: Negative for abdominal pain, blood in stool, constipation, diarrhea, melena, nausea and vomiting.  Genitourinary: Negative for dysuria.  Musculoskeletal: Negative for falls and joint pain.  Skin: Negative  for itching and rash.  Neurological: Positive for sensory change. Negative for dizziness and loss of consciousness.       Numbness of bilateral 5th toes when removes shoes  Endo/Heme/Allergies: Bruises/bleeds easily.  Psychiatric/Behavioral: Positive for memory loss. Negative for depression. The patient is not nervous/anxious and does not have insomnia.        Admits to cognitive decline    Health Maintenance  Topic Date Due  . INFLUENZA VACCINE  08/05/2018  . TETANUS/TDAP  06/02/2027  . PNA vac Low Risk Adult  Completed    Physical Exam: Vitals:   05/17/18 0938  BP: 120/70  Pulse: 71  Temp: (!) 97.5 F (36.4 C)  TempSrc: Oral  SpO2: 99%  Weight: 167 lb (75.8 kg)  Height: 5\' 9"  (1.753 m)   Body mass index is 24.66 kg/m. Physical Exam Vitals signs reviewed.  Constitutional:      General: He is not in acute distress.    Comments: Cachectic appearing now; dyspneic upon arrival from walking from entrance  HENT:     Head: Normocephalic and atraumatic.     Ears:     Comments: HOH Cardiovascular:     Rate and Rhythm: Normal rate and regular rhythm.  Pulmonary:     Effort: Pulmonary effort is normal.     Breath  sounds: Rales present.     Comments: Rales at bilateral bases Abdominal:     General: Bowel sounds are normal.  Musculoskeletal: Normal range of motion.     Comments: Stooped posture when walking  Skin:    General: Skin is warm and dry.     Comments: Grayish skin tone (not new); AV fistula in place; mild swelling noted of both hands   Neurological:     General: No focal deficit present.     Mental Status: He is alert and oriented to person, place, and time.  Psychiatric:        Mood and Affect: Mood normal.     Labs reviewed: Basic Metabolic Panel: Recent Labs    10/10/17 10/10/17 1149 10/11/17 0530  NA  --  131* 136  K 5.3 6.7* 4.0  CL  --  96* 98  CO2  --  19* 27  GLUCOSE  --  98 94  BUN  --  67* 32*  CREATININE  --  9.58* 6.21*  CALCIUM  --  9.7 8.7*   Liver Function Tests: Recent Labs    10/10/17 1149  AST 9*  ALT 12  ALKPHOS 55  BILITOT 0.7  PROT 7.5  ALBUMIN 3.6   No results for input(s): LIPASE, AMYLASE in the last 8760 hours. No results for input(s): AMMONIA in the last 8760 hours. CBC: Recent Labs    10/10/17 10/10/17 1149 10/11/17 0530  WBC  --  9.3 7.2  NEUTROABS  --  6.0  --   HGB 11.3* 11.9* 10.4*  HCT  --  35.9* 31.1*  MCV  --  102.6* 100.6*  PLT  --  182 145*   Lipid Panel: No results for input(s): CHOL, HDL, LDLCALC, TRIG, CHOLHDL, LDLDIRECT in the last 8760 hours. Lab Results  Component Value Date   HGBA1C 5.2 12/06/2014    Assessment/Plan 1. Dyspnea on exertion -suspect progression of CHF, but may be due to ESRD and years of dialysis taking its toll at 85 yo--he has cachexia and fatigue, as well -symptoms are similar to how he felt immediately after his CABG, but he then improved to where he could walk  around campus--now has to stop after a couple hundred feet  2. Coronary artery disease involving native coronary artery of native heart with unstable angina pectoris (HCC) -s/p CABG, had improved clinically, but now has  declined--?right-sided chf picture given cachexia, fatigue, dyspnea on exertion, orthopnea vs progression of frailty/cachexia due to ESRD -needs repeat echo and cardiology f/u asap  3. Chronic respiratory failure with hypoxia (HCC) -now using oxygen on Monday nights just before his HD (tue/thurs/sat and has extra day b/w), really struggles Tuesday mornings also -did not qualify by numbers and paying for oxygen for comfort  4. ESRD on dialysis [facility]) -T/R/Sat HD at [facility] -followed by Dr. Lorrene Reid at [facility]  5. Nonischemic cardiomyopathy ([place]) -diagnosed at time of MI and cabg -seems he's taken a decline with this  6. A-V fistula (HCC) -remains intact, using for HD  7. Secondary hyperparathyroidism, renal ([place]) -monitored at HD  Labs/tests ordered:  F/u with cardiology asap  Next appt:  07/19/2018 f/u on dyspnea, weight loss  Eugenia Eldredge L. Suhail Peloquin, D.O. [facility] [address], [place] [address] Cell Phone (Mon-Fri 8am-5pm):  [phone] On Call:  [phone] & follow prompts after 5pm & weekends Office Phone:  [phone] Office Fax:  [phone]

## 2018-05-17 NOTE — Progress Notes (Signed)
I can get an echo arranged - hopefully pretty quickly now that we are opening back up.

## 2018-05-18 ENCOUNTER — Telehealth: Payer: Self-pay | Admitting: *Deleted

## 2018-05-18 DIAGNOSIS — N184 Chronic kidney disease, stage 4 (severe): Secondary | ICD-10-CM | POA: Diagnosis not present

## 2018-05-18 DIAGNOSIS — D631 Anemia in chronic kidney disease: Secondary | ICD-10-CM | POA: Diagnosis not present

## 2018-05-18 DIAGNOSIS — D509 Iron deficiency anemia, unspecified: Secondary | ICD-10-CM | POA: Diagnosis not present

## 2018-05-18 DIAGNOSIS — N186 End stage renal disease: Secondary | ICD-10-CM | POA: Diagnosis not present

## 2018-05-18 DIAGNOSIS — N2581 Secondary hyperparathyroidism of renal origin: Secondary | ICD-10-CM | POA: Diagnosis not present

## 2018-05-18 NOTE — Telephone Encounter (Signed)
S/w daughter per [facility]) stated tried to call pt several times this am and cannot get in touch with pt.  Daughter stated does not speak to pt often due to the [place].  Daughter stated will try an text pt to tell pt to call office but doesn't know if pt texts. Pt is scheduled for a ASAP echo tomorrow per Dr. Mariea Clonts at [facility] yesterday. Pt's echo is scheduled for  May 15 @ 8:45 am.    Pt is at dialysis on Tues,Thurs, and Sat. till 12:00 pm. Called triage,  s/w Rockney Ghee, RN team lead today.  Lelon Frohlich will call pt this afternoon if pt does not call this am. Will send to Lelon Frohlich to [place] and if this am confirmation was received that pt is aware of echo tomorrow will Lerry Paterson. Only talk with pt about appt tomorrow.

## 2018-05-18 NOTE — Telephone Encounter (Signed)
Spoke with the pt and he verbalized understanding that he his scheduled for an Echo tomorrow morning... He will be picked up around 8-8:15am to be brought to the office and will wear his mask. Will call if any questions before end of the day.

## 2018-05-18 NOTE — Telephone Encounter (Signed)
-----   Message from Burtis Junes, NP sent at 05/18/2018  7:05 AM EDT ----- Ok to arrange asap  lori ----- Message ----- From: Gayland Curry, DO Sent: 05/17/2018   4:22 PM EDT To: Burtis Junes, NP  In this situation, he will be.  He really needs this.  He also goes out to HD anyway.   ----- Message ----- From: Burtis Junes, NP Sent: 05/17/2018   1:32 PM EDT To: Gayland Curry, DO  Just wanted to make sure he will be able to be let out of the facility to get his echo?  I can move up his visit - it will most likely be virtual - but we are adding providers back in the office - so there are options.   lori

## 2018-05-19 ENCOUNTER — Other Ambulatory Visit: Payer: Self-pay

## 2018-05-19 ENCOUNTER — Ambulatory Visit (HOSPITAL_COMMUNITY): Payer: Medicare Other | Attending: Internal Medicine

## 2018-05-19 DIAGNOSIS — I428 Other cardiomyopathies: Secondary | ICD-10-CM | POA: Diagnosis not present

## 2018-05-19 MED ORDER — PERFLUTREN LIPID MICROSPHERE
1.0000 mL | INTRAVENOUS | Status: AC | PRN
  Administered 2018-05-19: 2 mL via INTRAVENOUS

## 2018-05-20 DIAGNOSIS — N2581 Secondary hyperparathyroidism of renal origin: Secondary | ICD-10-CM | POA: Diagnosis not present

## 2018-05-20 DIAGNOSIS — N184 Chronic kidney disease, stage 4 (severe): Secondary | ICD-10-CM | POA: Diagnosis not present

## 2018-05-20 DIAGNOSIS — D509 Iron deficiency anemia, unspecified: Secondary | ICD-10-CM | POA: Diagnosis not present

## 2018-05-20 DIAGNOSIS — D631 Anemia in chronic kidney disease: Secondary | ICD-10-CM | POA: Diagnosis not present

## 2018-05-20 DIAGNOSIS — N186 End stage renal disease: Secondary | ICD-10-CM | POA: Diagnosis not present

## 2018-05-23 DIAGNOSIS — N184 Chronic kidney disease, stage 4 (severe): Secondary | ICD-10-CM | POA: Diagnosis not present

## 2018-05-23 DIAGNOSIS — N186 End stage renal disease: Secondary | ICD-10-CM | POA: Diagnosis not present

## 2018-05-23 DIAGNOSIS — N2581 Secondary hyperparathyroidism of renal origin: Secondary | ICD-10-CM | POA: Diagnosis not present

## 2018-05-23 DIAGNOSIS — D509 Iron deficiency anemia, unspecified: Secondary | ICD-10-CM | POA: Diagnosis not present

## 2018-05-23 DIAGNOSIS — D631 Anemia in chronic kidney disease: Secondary | ICD-10-CM | POA: Diagnosis not present

## 2018-05-25 DIAGNOSIS — N184 Chronic kidney disease, stage 4 (severe): Secondary | ICD-10-CM | POA: Diagnosis not present

## 2018-05-25 DIAGNOSIS — N2581 Secondary hyperparathyroidism of renal origin: Secondary | ICD-10-CM | POA: Diagnosis not present

## 2018-05-25 DIAGNOSIS — D631 Anemia in chronic kidney disease: Secondary | ICD-10-CM | POA: Diagnosis not present

## 2018-05-25 DIAGNOSIS — N186 End stage renal disease: Secondary | ICD-10-CM | POA: Diagnosis not present

## 2018-05-25 DIAGNOSIS — D509 Iron deficiency anemia, unspecified: Secondary | ICD-10-CM | POA: Diagnosis not present

## 2018-05-26 ENCOUNTER — Encounter: Payer: Self-pay | Admitting: Internal Medicine

## 2018-05-26 ENCOUNTER — Other Ambulatory Visit: Payer: Self-pay

## 2018-05-26 ENCOUNTER — Telehealth (INDEPENDENT_AMBULATORY_CARE_PROVIDER_SITE_OTHER): Payer: Medicare Other | Admitting: Internal Medicine

## 2018-05-26 ENCOUNTER — Telehealth: Payer: Self-pay

## 2018-05-26 ENCOUNTER — Telehealth: Payer: Self-pay | Admitting: Internal Medicine

## 2018-05-26 VITALS — Ht 69.0 in | Wt 165.0 lb

## 2018-05-26 DIAGNOSIS — I428 Other cardiomyopathies: Secondary | ICD-10-CM | POA: Diagnosis not present

## 2018-05-26 DIAGNOSIS — N186 End stage renal disease: Secondary | ICD-10-CM

## 2018-05-26 DIAGNOSIS — I255 Ischemic cardiomyopathy: Secondary | ICD-10-CM

## 2018-05-26 DIAGNOSIS — I447 Left bundle-branch block, unspecified: Secondary | ICD-10-CM

## 2018-05-26 DIAGNOSIS — I10 Essential (primary) hypertension: Secondary | ICD-10-CM

## 2018-05-26 DIAGNOSIS — I5022 Chronic systolic (congestive) heart failure: Secondary | ICD-10-CM

## 2018-05-26 DIAGNOSIS — Z992 Dependence on renal dialysis: Secondary | ICD-10-CM

## 2018-05-26 DIAGNOSIS — I350 Nonrheumatic aortic (valve) stenosis: Secondary | ICD-10-CM

## 2018-05-26 MED ORDER — CARVEDILOL 3.125 MG PO TABS: 3.1250 mg | ORAL_TABLET | Freq: Two times a day (BID) | ORAL | 3 refills | Status: DC

## 2018-05-26 NOTE — Progress Notes (Signed)
Electrophysiology TeleHealth Note   Due to national recommendations of social distancing due to COVID 19, an audio/video telehealth visit is felt to be most appropriate for this patient at this time.  See MyChart message from today for the patient's consent to telehealth for [facility].   Date:  05/26/2018   ID:  Eric [name], DOB 06/11/1930, MRN [id number]  Location: patient's home  Provider location: [address], [place]  Evaluation Performed: Follow-up visit  PCP:  Gayland Curry, DO  Cardiologist:   LG/SK    Chief Complaint: Fatigue  History of Present Illness:    Eric [name] is a 85 y.o. male who presents via audio/video conferencing for a telehealth visit today.  Since last being seen in our clinic for coronary artery disease with prior bypass surgery and aortic valve bioprosthesis with depressed LV function in the setting of chronic kidney disease on hemodialysis, the patient reports gradually worsening fatigue over the last 4 years, and by fatigue remains exercise intolerance.  His measure of thickness however dates back to prior to his heart valve surgery.  He is not sure that there is any change in functional status over the last 6 months although he thinks there may be some over the last 12 months.  He has some nocturnal dyspnea on the long intradialytic interval i.e. Monday night.  Infrequent post dialysis lightheadedness.  No intradialytic lightheadedness and blood pressures prior to dialysis are typically 947-654 systolic  No chest pains.  No edema and no palpitations  DATE TEST EF   11/16 Echo 35-40%   6/18 Echo   *35-40 %   5/20 Echo 20-25% MR Mod   Date Cr K Hgb  10/19   4.0 10.4           The patient denies symptoms of fevers, chills, cough, or new SOB worrisome for COVID 19.    Past Medical History:  Diagnosis Date  . Anxiety   . Arthritis   . Complication of anesthesia    " one time my heart stopped due to a medication  that I was on."   . ESRD (end stage renal disease) on dialysis ([place])    Tues, Thursday, Saturday; [facility]; [place] (10/10/2017)  . GERD (gastroesophageal reflux disease)   . Gout   . Heart murmur    as a teen  . Herpes zoster 04/2012  . Hypertension   . Shortness of breath dyspnea    with exertion    Past Surgical History:  Procedure Laterality Date  . AORTIC VALVE REPLACEMENT N/A 12/09/2014   Procedure: AORTIC VALVE REPLACEMENT (AVR) WITH 23MM MAGNA EASE BIOPROSTHETIC VALVE;  Surgeon: Gaye Pollack, MD;  Location: [place] OR;  Service: Open Heart Surgery;  Laterality: N/A;  . APPENDECTOMY  1944  . AV FISTULA PLACEMENT Left    Left Cimino AVF placed in [place]   . AV FISTULA PLACEMENT Right 03/06/2014   Procedure: ARTERIOVENOUS (AV) FISTULA CREATION-right radiocephalic;  Surgeon: Mal Misty, MD;  Location: [facility] OR;  Service: Vascular;  Laterality: Right;  . AV FISTULA PLACEMENT Right 07/15/2014   Procedure: ARTERIOVENOUS (AV) FISTULA CREATION;  Surgeon: Elam Dutch, MD;  Location: [place];  Service: Vascular;  Laterality: Right;  . CARDIAC CATHETERIZATION N/A 11/27/2014   Procedure: Right/Left Heart Cath and Coronary Angiography;  Surgeon: Burnell Blanks, MD;  Location: [place] CV LAB;  Service: Cardiovascular;  Laterality: N/A;  . CATARACT EXTRACTION W/ INTRAOCULAR LENS  IMPLANT Bilateral 2014   [facility]  . COLONOSCOPY  2013   Dr. Annamaria Helling [place], [place].  . CORONARY ARTERY BYPASS GRAFT N/A 12/09/2014   Procedure: CORONARY ARTERY BYPASS GRAFTING (CABG), ON PUMP, TIMES THREE, USING LEFT INTERNAL MAMMARY ARTERY, RIGHT GREATER SAPHENOUS VEIN HARVESTED ENDOSCOPICALLY;  Surgeon: Gaye Pollack, MD;  Location: [place];  Service: Open Heart Surgery;  Laterality: N/A;  LIMA-LAD; SVG-OM; SVG-PD  . DIALYSIS FISTULA CREATION  08/04/2012 and 10/13   Dr. Harden Mo  . INSERTION OF DIALYSIS CATHETER Right 01-15-14   Right chest TDC placed by Dr. Augustin Coupe at [facility]  . LIGATION OF ARTERIOVENOUS  FISTULA Right 07/15/2014   Procedure: LIGATION OF ARTERIOVENOUS  FISTULA  (RIGHT RADIOCEPHALIC);  Surgeon: Elam Dutch, MD;  Location: [place];  Service: Vascular;  Laterality: Right;  . LIGATION OF COMPETING BRANCHES OF ARTERIOVENOUS FISTULA Right 05/08/2014   Procedure: LIGATION OF COMPETING BRANCHES OF RIGHT ARM RADIOCEPHALIC ARTERIOVENOUS FISTULA;  Surgeon: Mal Misty, MD;  Location: [place];  Service: Vascular;  Laterality: Right;  . REVISON OF ARTERIOVENOUS FISTULA Right 07/15/2014   Procedure: EXPLORATION OF ARTERIOVENOUS FISTULA;  Surgeon: Elam Dutch, MD;  Location: [place];  Service: Vascular;  Laterality: Right;  . TEE WITHOUT CARDIOVERSION N/A 12/09/2014   Procedure: TRANSESOPHAGEAL ECHOCARDIOGRAM (TEE);  Surgeon: Gaye Pollack, MD;  Location: [place] [place];  Service: Open Heart Surgery;  Laterality: N/A;    Current Outpatient Medications  Medication Sig Dispense Refill  . allopurinol (ZYLOPRIM) 100 MG tablet Take 100 mg by mouth daily.    Marland Kitchen atorvastatin (LIPITOR) 20 MG tablet Take 20 mg by mouth daily at 6 PM.    . calcium carbonate (TUMS - DOSED IN MG ELEMENTAL CALCIUM) 500 MG chewable tablet Chew 2 tablets by mouth daily as needed for indigestion.     . cinacalcet (SENSIPAR) 60 MG tablet Take 60 mg by mouth every Monday, Wednesday, Friday, and Saturday at 6 PM.     . metoprolol tartrate (LOPRESSOR) 25 MG tablet TAKE (1/2) TABLET TWICE DAILY. 90 tablet 0  . omeprazole (PRILOSEC) 20 MG capsule Take 20 mg by mouth daily.      No current facility-administered medications for this visit.     Allergies:   Patient has no known allergies.   Social History:  The patient  reports that he has never smoked. He has never used smokeless tobacco. He reports current alcohol use of about 7.0 standard drinks of alcohol per week. He reports that he does not use drugs.   Family History:  The patient's   family history includes Cancer in his mother; Diabetes in  his father; Lung cancer (age of onset: 39) in his father.   ROS:  Please see the history of present illness.   All other systems are personally reviewed and negative.    Exam:    Vital Signs:  Ht 5\' 9"  (1.753 m)   Wt 165 lb (74.8 kg)   BMI 24.37 kg/m     Well appearing, alert and conversant, regular work of breathing,  good skin color Eyes- anicteric, neuro- grossly intact, skin- no apparent rash or lesions or cyanosis, mouth- oral mucosa is pink   Labs/Other Tests and Data Reviewed:    Recent Labs: 10/10/2017: ALT 12 10/11/2017: BUN 32; Creatinine, Ser 6.21; Hemoglobin 10.4; Platelets 145; Potassium 4.0; Sodium 136   Wt Readings from Last 3 Encounters:  05/26/18 165 lb (74.8 kg)  05/17/18 167 lb (75.8 kg)  02/18/18 175 lb (79.4 kg)     Other studies personally reviewed: Additional studies/ records that were reviewed today include: As above    ASSESSMENT & PLAN:    LBBB  ICM  S/p CABG--AVR  Hemodialysis  Hypertension   Anemia  Congestive heart failure-chronic-systolic  The patient has modest exercise intolerance in the context of moderate LV dysfunction.  There has been a gradual change in LV function post following his surgery and a gradual decrease in exercise tolerance.  The fact that the left bundle branch block occurred at the time of surgery suggest that much of his cardiomyopathy is likely unrelated to the left bundle branch block; this would have some impact on likelihood of improvement.  2 things good me significant pause however about recommending CRT-P implantation.  The first is the above; the second is end-stage renal disease which increases the risk of infection of the device and potentially secondarily of his valve.  Given the fact that there has not been a significant change in exercise tolerance, we discussed the approach of being very aggressive and implanting or being cautious and observing, he is in favor of the latter.  He and I are in  agreement.  We will change his Lopressor to carvedilol given his benefits in cardiomyopathy.  I will reach out to Dr. Donley Redder to see whether he is a candidate for any other guideline directed therapy for his cardiomyopathy.    COVID 19 screen The patient denies symptoms of COVID 19 at this time.  The importance of social distancing was discussed today.  Follow-up:  55m    Current medicines are reviewed at length with the patient today.   The patient does not have concerns regarding his medicines.  The following changes were made today:  Stop metoprolol Begin carvedilol 3.125 bid  Labs/ tests ordered today include:   No orders of the defined types were placed in this encounter.   Future tests ( post COVID )    in    months  Patient Risk:  after full review of this patients clinical status, I feel that they are at moderate  risk at this time.  Today, I have spent 21 minutes with the patient with telehealth technology discussing the above.  Signed, Virl Axe, MD  05/26/2018 2:38 PM     [facility] [address] [place] [address] [phone] (office) [phone] (fax)  We spent more than 50% of our 35 2 min visit in face to face counseling regarding the above

## 2018-05-26 NOTE — Telephone Encounter (Signed)

## 2018-05-26 NOTE — Telephone Encounter (Signed)
New Message    Pt is calling because he says he was on a BP medication that almost killed him. He thinks it was called amlodipine

## 2018-05-26 NOTE — Telephone Encounter (Signed)
Spoke with pt. He states he thinks Amlodipine "made his heart stop during an operation one time." I advised him this medication (carvedilol) is different than Amlodipine. However, if he begins it and starts to feel poor, please call the office back for medication adjustments. He verbalized understanding and had no additional questions.

## 2018-05-26 NOTE — Patient Instructions (Signed)
LVM with pt to return my call with any questions regarding the following recommendations:  Medication Instructions:  Your physician has recommended you make the following change in your medication:  1. Stop Metoprolol 2. Begin Carvedilol, 3.125mg  tablet, two times per day  Labwork: None ordered.   Testing/Procedures: None ordered.   Follow-Up: Your physician recommends that you schedule a follow-up appointment in: 6 months with Dr Caryl Comes  Any Other Special Instructions Will Be Listed Below (If Applicable).     If you need a refill on your cardiac medications before your next appointment, please call your pharmacy.

## 2018-05-27 DIAGNOSIS — N186 End stage renal disease: Secondary | ICD-10-CM | POA: Diagnosis not present

## 2018-05-27 DIAGNOSIS — D509 Iron deficiency anemia, unspecified: Secondary | ICD-10-CM | POA: Diagnosis not present

## 2018-05-27 DIAGNOSIS — N2581 Secondary hyperparathyroidism of renal origin: Secondary | ICD-10-CM | POA: Diagnosis not present

## 2018-05-27 DIAGNOSIS — N184 Chronic kidney disease, stage 4 (severe): Secondary | ICD-10-CM | POA: Diagnosis not present

## 2018-05-27 DIAGNOSIS — D631 Anemia in chronic kidney disease: Secondary | ICD-10-CM | POA: Diagnosis not present

## 2018-05-30 DIAGNOSIS — D509 Iron deficiency anemia, unspecified: Secondary | ICD-10-CM | POA: Diagnosis not present

## 2018-05-30 DIAGNOSIS — N186 End stage renal disease: Secondary | ICD-10-CM | POA: Diagnosis not present

## 2018-05-30 DIAGNOSIS — D631 Anemia in chronic kidney disease: Secondary | ICD-10-CM | POA: Diagnosis not present

## 2018-05-30 DIAGNOSIS — N184 Chronic kidney disease, stage 4 (severe): Secondary | ICD-10-CM | POA: Diagnosis not present

## 2018-05-30 DIAGNOSIS — N2581 Secondary hyperparathyroidism of renal origin: Secondary | ICD-10-CM | POA: Diagnosis not present

## 2018-05-31 ENCOUNTER — Telehealth: Payer: Self-pay | Admitting: *Deleted

## 2018-05-31 NOTE — Telephone Encounter (Signed)
Pt is aware of Lori's recommendation's for a telehealth visit in one month with Truitt Merle, NP. Pt is aware of time and date and agreeable to treatment plan.

## 2018-05-31 NOTE — Telephone Encounter (Signed)
-----   Message from Burtis Junes, NP sent at 05/30/2018  1:29 PM EDT ----- So Caryl Comes changed some medicine on him and said see him in 6 months   That's not going to work  How about virtual visit with me in one month  lori

## 2018-06-01 DIAGNOSIS — N184 Chronic kidney disease, stage 4 (severe): Secondary | ICD-10-CM | POA: Diagnosis not present

## 2018-06-01 DIAGNOSIS — N2581 Secondary hyperparathyroidism of renal origin: Secondary | ICD-10-CM | POA: Diagnosis not present

## 2018-06-01 DIAGNOSIS — N186 End stage renal disease: Secondary | ICD-10-CM | POA: Diagnosis not present

## 2018-06-01 DIAGNOSIS — D509 Iron deficiency anemia, unspecified: Secondary | ICD-10-CM | POA: Diagnosis not present

## 2018-06-01 DIAGNOSIS — D631 Anemia in chronic kidney disease: Secondary | ICD-10-CM | POA: Diagnosis not present

## 2018-06-03 DIAGNOSIS — D631 Anemia in chronic kidney disease: Secondary | ICD-10-CM | POA: Diagnosis not present

## 2018-06-03 DIAGNOSIS — N186 End stage renal disease: Secondary | ICD-10-CM | POA: Diagnosis not present

## 2018-06-03 DIAGNOSIS — D509 Iron deficiency anemia, unspecified: Secondary | ICD-10-CM | POA: Diagnosis not present

## 2018-06-03 DIAGNOSIS — N2581 Secondary hyperparathyroidism of renal origin: Secondary | ICD-10-CM | POA: Diagnosis not present

## 2018-06-03 DIAGNOSIS — N184 Chronic kidney disease, stage 4 (severe): Secondary | ICD-10-CM | POA: Diagnosis not present

## 2018-06-05 ENCOUNTER — Telehealth: Payer: Medicare Other | Admitting: Nurse Practitioner

## 2018-06-05 DIAGNOSIS — N2889 Other specified disorders of kidney and ureter: Secondary | ICD-10-CM | POA: Diagnosis not present

## 2018-06-05 DIAGNOSIS — Z992 Dependence on renal dialysis: Secondary | ICD-10-CM | POA: Diagnosis not present

## 2018-06-05 DIAGNOSIS — N186 End stage renal disease: Secondary | ICD-10-CM | POA: Diagnosis not present

## 2018-06-06 DIAGNOSIS — N184 Chronic kidney disease, stage 4 (severe): Secondary | ICD-10-CM | POA: Diagnosis not present

## 2018-06-06 DIAGNOSIS — N186 End stage renal disease: Secondary | ICD-10-CM | POA: Diagnosis not present

## 2018-06-06 DIAGNOSIS — N2581 Secondary hyperparathyroidism of renal origin: Secondary | ICD-10-CM | POA: Diagnosis not present

## 2018-06-06 DIAGNOSIS — D631 Anemia in chronic kidney disease: Secondary | ICD-10-CM | POA: Diagnosis not present

## 2018-06-08 DIAGNOSIS — D631 Anemia in chronic kidney disease: Secondary | ICD-10-CM | POA: Diagnosis not present

## 2018-06-08 DIAGNOSIS — N186 End stage renal disease: Secondary | ICD-10-CM | POA: Diagnosis not present

## 2018-06-08 DIAGNOSIS — N184 Chronic kidney disease, stage 4 (severe): Secondary | ICD-10-CM | POA: Diagnosis not present

## 2018-06-08 DIAGNOSIS — N2581 Secondary hyperparathyroidism of renal origin: Secondary | ICD-10-CM | POA: Diagnosis not present

## 2018-06-10 DIAGNOSIS — N184 Chronic kidney disease, stage 4 (severe): Secondary | ICD-10-CM | POA: Diagnosis not present

## 2018-06-10 DIAGNOSIS — N186 End stage renal disease: Secondary | ICD-10-CM | POA: Diagnosis not present

## 2018-06-10 DIAGNOSIS — N2581 Secondary hyperparathyroidism of renal origin: Secondary | ICD-10-CM | POA: Diagnosis not present

## 2018-06-10 DIAGNOSIS — D631 Anemia in chronic kidney disease: Secondary | ICD-10-CM | POA: Diagnosis not present

## 2018-06-13 DIAGNOSIS — N184 Chronic kidney disease, stage 4 (severe): Secondary | ICD-10-CM | POA: Diagnosis not present

## 2018-06-13 DIAGNOSIS — N186 End stage renal disease: Secondary | ICD-10-CM | POA: Diagnosis not present

## 2018-06-13 DIAGNOSIS — N2581 Secondary hyperparathyroidism of renal origin: Secondary | ICD-10-CM | POA: Diagnosis not present

## 2018-06-13 DIAGNOSIS — D631 Anemia in chronic kidney disease: Secondary | ICD-10-CM | POA: Diagnosis not present

## 2018-06-14 ENCOUNTER — Ambulatory Visit: Payer: Medicare Other | Admitting: Nurse Practitioner

## 2018-06-15 DIAGNOSIS — N2581 Secondary hyperparathyroidism of renal origin: Secondary | ICD-10-CM | POA: Diagnosis not present

## 2018-06-15 DIAGNOSIS — D631 Anemia in chronic kidney disease: Secondary | ICD-10-CM | POA: Diagnosis not present

## 2018-06-15 DIAGNOSIS — N186 End stage renal disease: Secondary | ICD-10-CM | POA: Diagnosis not present

## 2018-06-15 DIAGNOSIS — N184 Chronic kidney disease, stage 4 (severe): Secondary | ICD-10-CM | POA: Diagnosis not present

## 2018-06-17 DIAGNOSIS — D631 Anemia in chronic kidney disease: Secondary | ICD-10-CM | POA: Diagnosis not present

## 2018-06-17 DIAGNOSIS — N186 End stage renal disease: Secondary | ICD-10-CM | POA: Diagnosis not present

## 2018-06-17 DIAGNOSIS — N184 Chronic kidney disease, stage 4 (severe): Secondary | ICD-10-CM | POA: Diagnosis not present

## 2018-06-17 DIAGNOSIS — N2581 Secondary hyperparathyroidism of renal origin: Secondary | ICD-10-CM | POA: Diagnosis not present

## 2018-06-20 DIAGNOSIS — D631 Anemia in chronic kidney disease: Secondary | ICD-10-CM | POA: Diagnosis not present

## 2018-06-20 DIAGNOSIS — N186 End stage renal disease: Secondary | ICD-10-CM | POA: Diagnosis not present

## 2018-06-20 DIAGNOSIS — N184 Chronic kidney disease, stage 4 (severe): Secondary | ICD-10-CM | POA: Diagnosis not present

## 2018-06-20 DIAGNOSIS — N2581 Secondary hyperparathyroidism of renal origin: Secondary | ICD-10-CM | POA: Diagnosis not present

## 2018-06-21 ENCOUNTER — Other Ambulatory Visit: Payer: Self-pay | Admitting: Internal Medicine

## 2018-06-22 DIAGNOSIS — N2581 Secondary hyperparathyroidism of renal origin: Secondary | ICD-10-CM | POA: Diagnosis not present

## 2018-06-22 DIAGNOSIS — N186 End stage renal disease: Secondary | ICD-10-CM | POA: Diagnosis not present

## 2018-06-22 DIAGNOSIS — D631 Anemia in chronic kidney disease: Secondary | ICD-10-CM | POA: Diagnosis not present

## 2018-06-22 DIAGNOSIS — N184 Chronic kidney disease, stage 4 (severe): Secondary | ICD-10-CM | POA: Diagnosis not present

## 2018-06-24 DIAGNOSIS — N184 Chronic kidney disease, stage 4 (severe): Secondary | ICD-10-CM | POA: Diagnosis not present

## 2018-06-24 DIAGNOSIS — N2581 Secondary hyperparathyroidism of renal origin: Secondary | ICD-10-CM | POA: Diagnosis not present

## 2018-06-24 DIAGNOSIS — N186 End stage renal disease: Secondary | ICD-10-CM | POA: Diagnosis not present

## 2018-06-24 DIAGNOSIS — D631 Anemia in chronic kidney disease: Secondary | ICD-10-CM | POA: Diagnosis not present

## 2018-06-27 DIAGNOSIS — N184 Chronic kidney disease, stage 4 (severe): Secondary | ICD-10-CM | POA: Diagnosis not present

## 2018-06-27 DIAGNOSIS — N186 End stage renal disease: Secondary | ICD-10-CM | POA: Diagnosis not present

## 2018-06-27 DIAGNOSIS — N2581 Secondary hyperparathyroidism of renal origin: Secondary | ICD-10-CM | POA: Diagnosis not present

## 2018-06-27 DIAGNOSIS — D631 Anemia in chronic kidney disease: Secondary | ICD-10-CM | POA: Diagnosis not present

## 2018-06-29 ENCOUNTER — Telehealth: Payer: Self-pay

## 2018-06-29 DIAGNOSIS — D631 Anemia in chronic kidney disease: Secondary | ICD-10-CM | POA: Diagnosis not present

## 2018-06-29 DIAGNOSIS — N186 End stage renal disease: Secondary | ICD-10-CM | POA: Diagnosis not present

## 2018-06-29 DIAGNOSIS — N184 Chronic kidney disease, stage 4 (severe): Secondary | ICD-10-CM | POA: Diagnosis not present

## 2018-06-29 DIAGNOSIS — N2581 Secondary hyperparathyroidism of renal origin: Secondary | ICD-10-CM | POA: Diagnosis not present

## 2018-06-29 NOTE — Telephone Encounter (Signed)
I called and left patient a message to call back so I could go over covid screening questions

## 2018-06-30 NOTE — Telephone Encounter (Signed)
Follow up ° ° ° ° °COVID SCREENING QUESTIONS FOR IN-OFFICE VISITS - PLEASE DOCUMENT PATIENT ANSWERS ° °1. Do you currently have a fever? ° °a. NO ° °2. Have you recently traveled on a cruise, internationally, or to [place], [place], [place], [place], [place], or [place], [place] ([employer])? ° °a. NO ° °3. Have you been in contact with someone that is currently pending confirmation of Covid19 testing or has been confirmed to have the Covid19 virus? ° °a. NO ° °4. Are you currently experiencing fatigue or cough? ° °a. NO ° ° °

## 2018-06-30 NOTE — Progress Notes (Signed)
CARDIOLOGY OFFICE NOTE  Date:  07/03/2018    Eric [name] Date of Birth: May 19, 1930 Medical Record #[id number]  PCP:  Gayland Curry, DO  Cardiologist:  Ree Shay   Chief Complaint  Patient presents with  . Cardiomyopathy  . Shortness of Breath    Follow up visit    History of Present Illness: Eric [name] is a 85 y.o. male who presents today for a follow up visit. Seen for Dr. Caryl Comes.   He has a history of ESRD on HD via AV fistula, insomnia, HTN, and GERD. Reported stress test at [facility] in [place], [place] 2 summers ago - noted in the record that he "has a problem with the one chamber of his heart not being attached properly and he knows about it since childhood". No actual documentation of this. Reports history of cardiac arrest approximately 2 years ago while having fistula procedure - sounds like he had a stress test at that time.   I saw himoriginally inNovember of 2016as a new patient for DOE - had abnormal EKG and murmur on exam. Ended up getting echo, CT (negative for PE) and cath.   Cardiac cath on 11/27/2014 showed severe 3-vessel coronary disease. The proximal and mid LAD were heavily calcified with 80% proximal and 99% mid LAD stenosis followed by a 90% mid stenosis. The LCX had a 60% stenosis in a large OM2 and an occluded OM3 that long but small in diameter or underfilled by the collat from the LAD. The RCA is occluded distally with faint filling of a small diameter PDA branch by collat from the LAD. His echo showed at least moderate AS with a mean gradient of 36 mm Hg and a DI of 0.21. LVEF was reduced to 35% with anterior, septal and inferior wall motion abnormalities.   He underwent CABG x 3 with LIMA to LAD, SVG to OM2 and SVG to PDA with AVR with #23 Edwards Magna-Ease pericardial valve by Dr. Cyndia Bent on 12/09/2014. Nephrology assisted with his dialysis. He did initially require some inotropic support but this was able to be weaned over  time without difficulty. He had no significant postoperative cardiac dysrhythmias. He is on chronic Aranesp managed by the nephrologist.He has done well since - he did have lots of fatigue in 2018 - echo was updated and this turned out ok.  I have followed him since - he had issues with marked hypotension - Norvasc has been stopped. Wife with progressive dementia. Last seen by me in November - he was doing ok - his back was his most limiting factor.   Dr. Mariea Clonts reached out to me back in May - there was concern for worsening heart failure/failure to thrive. Echo was updated - Dr. Caryl Comes then saw him for a telehealth visit - Coreg was added. EF is now down to 20 to 25%.   The patient does not have symptoms concerning for COVID-19 infection (fever, chills, cough, or new shortness of breath).   Comes in today. Here alone. He says he is doing ok. Still with some fatigue. He started using a wedge at night and bought his own oxygen concentrator - this has helped his breathing. No chest pain. He notes he is sleeping more. He is having more issues with hypotension - especially on dialysis - he still takes his Coreg BID - will take after his dialysis. No palpitations. The quarantine has been hard on him - he liked to get out more. His  wife is having more issues with his memory.   Past Medical History:  Diagnosis Date  . Anxiety   . Arthritis   . Complication of anesthesia    " one time my heart stopped due to a medication that I was on."   . ESRD (end stage renal disease) on dialysis ([place])    Tues, Thursday, Saturday; [facility]; [place] (10/10/2017)  . GERD (gastroesophageal reflux disease)   . Gout   . Heart murmur    as a teen  . Herpes zoster 04/2012  . Hypertension   . Shortness of breath dyspnea    with exertion    Past Surgical History:  Procedure Laterality Date  . AORTIC VALVE REPLACEMENT N/A 12/09/2014   Procedure: AORTIC VALVE REPLACEMENT (AVR) WITH 23MM MAGNA EASE  BIOPROSTHETIC VALVE;  Surgeon: Gaye Pollack, MD;  Location: [facility] OR;  Service: Open Heart Surgery;  Laterality: N/A;  . APPENDECTOMY  1944  . AV FISTULA PLACEMENT Left    Left Cimino AVF placed in [place]   . AV FISTULA PLACEMENT Right 03/06/2014   Procedure: ARTERIOVENOUS (AV) FISTULA CREATION-right radiocephalic;  Surgeon: Mal Misty, MD;  Location: [facility] OR;  Service: Vascular;  Laterality: Right;  . AV FISTULA PLACEMENT Right 07/15/2014   Procedure: ARTERIOVENOUS (AV) FISTULA CREATION;  Surgeon: Elam Dutch, MD;  Location: [place];  Service: Vascular;  Laterality: Right;  . CARDIAC CATHETERIZATION N/A 11/27/2014   Procedure: Right/Left Heart Cath and Coronary Angiography;  Surgeon: Burnell Blanks, MD;  Location: [place] CV LAB;  Service: Cardiovascular;  Laterality: N/A;  . CATARACT EXTRACTION W/ INTRAOCULAR LENS IMPLANT Bilateral 2014   [facility]  . COLONOSCOPY  2013   Dr. Annamaria Helling [place], [place].  . CORONARY ARTERY BYPASS GRAFT N/A 12/09/2014   Procedure: CORONARY ARTERY BYPASS GRAFTING (CABG), ON PUMP, TIMES THREE, USING LEFT INTERNAL MAMMARY ARTERY, RIGHT GREATER SAPHENOUS VEIN HARVESTED ENDOSCOPICALLY;  Surgeon: Gaye Pollack, MD;  Location: [facility];  Service: Open Heart Surgery;  Laterality: N/A;  LIMA-LAD; SVG-OM; SVG-PD  . DIALYSIS FISTULA CREATION  08/04/2012 and 10/13   Dr. Harden [place]  . INSERTION OF DIALYSIS CATHETER Right 01-15-14   Right chest TDC placed by Dr. Augustin Coupe at [facility]  . LIGATION OF ARTERIOVENOUS  FISTULA Right 07/15/2014   Procedure: LIGATION OF ARTERIOVENOUS  FISTULA  (RIGHT RADIOCEPHALIC);  Surgeon: Elam Dutch, MD;  Location: [place];  Service: Vascular;  Laterality: Right;  . LIGATION OF COMPETING BRANCHES OF ARTERIOVENOUS FISTULA Right 05/08/2014   Procedure: LIGATION OF COMPETING BRANCHES OF RIGHT ARM RADIOCEPHALIC ARTERIOVENOUS FISTULA;  Surgeon: Mal Misty, MD;  Location: [facility];  Service: Vascular;  Laterality: Right;  .  REVISON OF ARTERIOVENOUS FISTULA Right 07/15/2014   Procedure: EXPLORATION OF ARTERIOVENOUS FISTULA;  Surgeon: Elam Dutch, MD;  Location: [facility];  Service: Vascular;  Laterality: Right;  . TEE WITHOUT CARDIOVERSION N/A 12/09/2014   Procedure: TRANSESOPHAGEAL ECHOCARDIOGRAM (TEE);  Surgeon: Gaye Pollack, MD;  Location: [facility];  Service: Open Heart Surgery;  Laterality: N/A;     Medications: Current Meds  Medication Sig  . allopurinol (ZYLOPRIM) 100 MG tablet TAKE 1 TABLET BY MOUTH DAILY  . atorvastatin (LIPITOR) 20 MG tablet TAKE 1 TABLET BY MOUTH DAILY FOR CHOLESTEROL  . calcium carbonate (TUMS - DOSED IN MG ELEMENTAL CALCIUM) 500 MG chewable tablet Chew 2 tablets by mouth daily as needed for indigestion.   . carvedilol (COREG) 3.125 MG tablet Take  1 tablet (3.125 mg total) by mouth 2 (two) times daily.  . cinacalcet (SENSIPAR) 60 MG tablet Take 60 mg by mouth every Monday, Wednesday, Friday, and Saturday at 6 PM.   . omeprazole (PRILOSEC) 20 MG capsule Take 20 mg by mouth daily.      Allergies: No Known Allergies  Social History: The patient  reports that he has never smoked. He has never used smokeless tobacco. He reports current alcohol use of about 7.0 standard drinks of alcohol per week. He reports that he does not use drugs.   Family History: The patient's family history includes Cancer in his mother; Diabetes in his father; Lung cancer (age of onset: 19) in his father.   Review of Systems: Please see the history of present illness.   All other systems are reviewed and negative.   Physical Exam: VS:  BP 118/68   Pulse 64   Ht 5\' 10"  (1.778 m)   Wt 164 lb (74.4 kg)   BMI 23.53 kg/m  .  BMI Body mass index is 23.53 kg/m.  Wt Readings from Last 3 Encounters:  07/03/18 164 lb (74.4 kg)  05/26/18 165 lb (74.8 kg)  05/17/18 167 lb (75.8 kg)    General: Pleasant. Elderly. Alert and in no acute distress.   HEENT: Normal.  Neck: Supple, no JVD, carotid bruits, or  masses noted.  Cardiac: Regular rate and rhythm. No significant murmur. No edema.  Respiratory:  Lungs are fairly clear to auscultation bilaterally with normal work of breathing.  GI: Soft and nontender.  MS: No deformity or atrophy. Gait and ROM intact.  Skin: Warm and dry. Color is normal.  Neuro:  Strength and sensation are intact and no gross focal deficits noted.  Psych: Alert, appropriate and with normal affect.   LABORATORY DATA:  EKG:  EKG is not ordered today.  Lab Results  Component Value Date   WBC 7.2 10/11/2017   HGB 10.4 (L) 10/11/2017   HCT 31.1 (L) 10/11/2017   PLT 145 (L) 10/11/2017   GLUCOSE 94 10/11/2017   CHOL 125 02/07/2017   TRIG 80 02/07/2017   HDL 37 (L) 02/07/2017   LDLCALC 72 02/07/2017   ALT 12 10/10/2017   AST 9 (L) 10/10/2017   NA 136 10/11/2017   K 4.0 10/11/2017   CL 98 10/11/2017   CREATININE 6.21 (H) 10/11/2017   BUN 32 (H) 10/11/2017   CO2 27 10/11/2017   INR 1.42 12/09/2014   HGBA1C 5.2 12/06/2014     BNP (last 3 results) No results for input(s): BNP in the last 8760 hours.  ProBNP (last 3 results) No results for input(s): PROBNP in the last 8760 hours.   Other Studies Reviewed Today:  ECHO IMPRESSIONS 05/2018   1. The left ventricle has severely reduced systolic function, with an ejection fraction of 20-25%. The cavity size was moderately dilated. Left ventricular diastolic Doppler parameters are indeterminate. There is abnormal septal motion consistent with  left bundle branch block. Left ventricular diffuse hypokinesis.  2. The right ventricle has severely reduced systolic function. The cavity was normal. There is no increase in right ventricular wall thickness.  3. A 62mm an Edwards Magna-Ease valve is present in the aortic position. Procedure Date: 12/09/14. The leaflets are not well visualized. No evidence of prosthetic or paravalvular regurgitation. Mean systolic gradient 8 mmHg, in the setting of severely  reduced LV  systolic function.  4. Left atrial size was moderately dilated.  5. Mitral valve regurgitation is moderate  by color flow Doppler.  6. When compared to the prior study: Compared to exam from 06/25/16, the ejection fraction has decreased. Mitral regurgitation may have increased. Side by side comparison of images performed.    Assessment/Plan:  1. Worsening chronic systolic HF - EF now at 20 to 25% - not able to be on ideal therapy given hypotension with dialysis and general soft BP - I told him it was ok to hold his Coreg after dialysis - may need to hold for the entire day. He is now using oxygen and a wedge pillow that has helped with his breathing. No other changes made today.   2. Prior hypotension - no longer on Norvasc which is no longer indicated given the marked reduction in EF. See #1.   3. CAD/AS - prior CABG/AVR from 2016 - no active chest pain. Would favor conservative management. Most recent echo noted. Not able to be on ideal therapy as noted above.   4. ESRD - on dialysis - ok to hold Coreg on dialysis days  - may help with his fatigue and hypotension  5. Chronic Anemia - remains on Aranesp - managed by Renal. Not discussed.   6. HLD - on statin therapy.  7. Chronic LBBB  8. Chronic fatigue - most likely multifactorial.   9. COVID-19 Education: The signs and symptoms of COVID-19 were discussed with the patient and how to seek care for testing (follow up with PCP or arrange E-visit).  The importance of social distancing, staying at home, hand hygiene and wearing a mask when out in public were discussed today.  Current medicines are reviewed with the patient today.  The patient does not have concerns regarding medicines other than what has been noted above.  The following changes have been made:  See above.  Labs/ tests ordered today include:   No orders of the defined types were placed in this encounter.    Disposition:   FU with [place] back as planned. Will be  available as needed.    Patient is agreeable to this plan and will call if any problems develop in the interim.   SignedTruitt Merle, NP  07/03/2018 12:20 PM  [facility] [address] [place], [place]  [address] Phone: [phone] Fax: [phone]

## 2018-07-01 DIAGNOSIS — D631 Anemia in chronic kidney disease: Secondary | ICD-10-CM | POA: Diagnosis not present

## 2018-07-01 DIAGNOSIS — N186 End stage renal disease: Secondary | ICD-10-CM | POA: Diagnosis not present

## 2018-07-01 DIAGNOSIS — N184 Chronic kidney disease, stage 4 (severe): Secondary | ICD-10-CM | POA: Diagnosis not present

## 2018-07-01 DIAGNOSIS — N2581 Secondary hyperparathyroidism of renal origin: Secondary | ICD-10-CM | POA: Diagnosis not present

## 2018-07-03 ENCOUNTER — Other Ambulatory Visit: Payer: Self-pay

## 2018-07-03 ENCOUNTER — Ambulatory Visit (INDEPENDENT_AMBULATORY_CARE_PROVIDER_SITE_OTHER): Payer: Medicare Other | Admitting: Nurse Practitioner

## 2018-07-03 ENCOUNTER — Encounter (INDEPENDENT_AMBULATORY_CARE_PROVIDER_SITE_OTHER): Payer: Self-pay

## 2018-07-03 ENCOUNTER — Encounter: Payer: Self-pay | Admitting: Nurse Practitioner

## 2018-07-03 VITALS — BP 118/68 | HR 64 | Ht 70.0 in | Wt 164.0 lb

## 2018-07-03 DIAGNOSIS — I10 Essential (primary) hypertension: Secondary | ICD-10-CM | POA: Diagnosis not present

## 2018-07-03 DIAGNOSIS — I447 Left bundle-branch block, unspecified: Secondary | ICD-10-CM | POA: Diagnosis not present

## 2018-07-03 DIAGNOSIS — I255 Ischemic cardiomyopathy: Secondary | ICD-10-CM

## 2018-07-03 DIAGNOSIS — I5022 Chronic systolic (congestive) heart failure: Secondary | ICD-10-CM

## 2018-07-03 NOTE — Patient Instructions (Signed)
After Visit Summary:  We will be checking the following labs today - NONE   Medication Instructions:    Continue with your current medicines.    If you need a refill on your cardiac medications before your next appointment, please call your pharmacy.     Testing/Procedures To Be Arranged:  N/A  Follow-Up:   See [place] back as planned.     At [facility], you and your health needs are our priority.  As part of our continuing mission to provide you with exceptional heart care, we have created designated Provider Care Teams.  These Care Teams include your primary Cardiologist (physician) and Advanced Practice Providers (APPs -  Physician Assistants and Nurse Practitioners) who all work together to provide you with the care you need, when you need it.  Special Instructions:  . Stay safe, stay home, wash your hands for at least 20 seconds and wear a mask when out in public.  . It was good to talk with you today.    Call the [place] office at [phone] if you have any questions, problems or concerns.

## 2018-07-04 DIAGNOSIS — N184 Chronic kidney disease, stage 4 (severe): Secondary | ICD-10-CM | POA: Diagnosis not present

## 2018-07-04 DIAGNOSIS — D631 Anemia in chronic kidney disease: Secondary | ICD-10-CM | POA: Diagnosis not present

## 2018-07-04 DIAGNOSIS — N186 End stage renal disease: Secondary | ICD-10-CM | POA: Diagnosis not present

## 2018-07-04 DIAGNOSIS — N2581 Secondary hyperparathyroidism of renal origin: Secondary | ICD-10-CM | POA: Diagnosis not present

## 2018-07-05 DIAGNOSIS — Z992 Dependence on renal dialysis: Secondary | ICD-10-CM | POA: Diagnosis not present

## 2018-07-05 DIAGNOSIS — N2889 Other specified disorders of kidney and ureter: Secondary | ICD-10-CM | POA: Diagnosis not present

## 2018-07-05 DIAGNOSIS — N186 End stage renal disease: Secondary | ICD-10-CM | POA: Diagnosis not present

## 2018-07-06 DIAGNOSIS — N186 End stage renal disease: Secondary | ICD-10-CM | POA: Diagnosis not present

## 2018-07-06 DIAGNOSIS — D631 Anemia in chronic kidney disease: Secondary | ICD-10-CM | POA: Diagnosis not present

## 2018-07-06 DIAGNOSIS — N184 Chronic kidney disease, stage 4 (severe): Secondary | ICD-10-CM | POA: Diagnosis not present

## 2018-07-06 DIAGNOSIS — N2581 Secondary hyperparathyroidism of renal origin: Secondary | ICD-10-CM | POA: Diagnosis not present

## 2018-07-08 DIAGNOSIS — N184 Chronic kidney disease, stage 4 (severe): Secondary | ICD-10-CM | POA: Diagnosis not present

## 2018-07-08 DIAGNOSIS — N186 End stage renal disease: Secondary | ICD-10-CM | POA: Diagnosis not present

## 2018-07-08 DIAGNOSIS — N2581 Secondary hyperparathyroidism of renal origin: Secondary | ICD-10-CM | POA: Diagnosis not present

## 2018-07-08 DIAGNOSIS — D631 Anemia in chronic kidney disease: Secondary | ICD-10-CM | POA: Diagnosis not present

## 2018-07-11 DIAGNOSIS — N2581 Secondary hyperparathyroidism of renal origin: Secondary | ICD-10-CM | POA: Diagnosis not present

## 2018-07-11 DIAGNOSIS — N186 End stage renal disease: Secondary | ICD-10-CM | POA: Diagnosis not present

## 2018-07-11 DIAGNOSIS — D631 Anemia in chronic kidney disease: Secondary | ICD-10-CM | POA: Diagnosis not present

## 2018-07-11 DIAGNOSIS — N184 Chronic kidney disease, stage 4 (severe): Secondary | ICD-10-CM | POA: Diagnosis not present

## 2018-07-13 DIAGNOSIS — N186 End stage renal disease: Secondary | ICD-10-CM | POA: Diagnosis not present

## 2018-07-13 DIAGNOSIS — N2581 Secondary hyperparathyroidism of renal origin: Secondary | ICD-10-CM | POA: Diagnosis not present

## 2018-07-13 DIAGNOSIS — D631 Anemia in chronic kidney disease: Secondary | ICD-10-CM | POA: Diagnosis not present

## 2018-07-13 DIAGNOSIS — N184 Chronic kidney disease, stage 4 (severe): Secondary | ICD-10-CM | POA: Diagnosis not present

## 2018-07-15 DIAGNOSIS — D631 Anemia in chronic kidney disease: Secondary | ICD-10-CM | POA: Diagnosis not present

## 2018-07-15 DIAGNOSIS — N2581 Secondary hyperparathyroidism of renal origin: Secondary | ICD-10-CM | POA: Diagnosis not present

## 2018-07-15 DIAGNOSIS — N186 End stage renal disease: Secondary | ICD-10-CM | POA: Diagnosis not present

## 2018-07-15 DIAGNOSIS — N184 Chronic kidney disease, stage 4 (severe): Secondary | ICD-10-CM | POA: Diagnosis not present

## 2018-07-18 DIAGNOSIS — N2581 Secondary hyperparathyroidism of renal origin: Secondary | ICD-10-CM | POA: Diagnosis not present

## 2018-07-18 DIAGNOSIS — N184 Chronic kidney disease, stage 4 (severe): Secondary | ICD-10-CM | POA: Diagnosis not present

## 2018-07-18 DIAGNOSIS — N186 End stage renal disease: Secondary | ICD-10-CM | POA: Diagnosis not present

## 2018-07-18 DIAGNOSIS — D631 Anemia in chronic kidney disease: Secondary | ICD-10-CM | POA: Diagnosis not present

## 2018-07-19 ENCOUNTER — Other Ambulatory Visit: Payer: Self-pay

## 2018-07-19 ENCOUNTER — Encounter: Payer: Self-pay | Admitting: Internal Medicine

## 2018-07-19 ENCOUNTER — Non-Acute Institutional Stay: Payer: Medicare Other | Admitting: Internal Medicine

## 2018-07-19 VITALS — BP 120/60 | HR 62 | Temp 97.7°F | Ht 70.0 in | Wt 164.0 lb

## 2018-07-19 DIAGNOSIS — R0609 Other forms of dyspnea: Secondary | ICD-10-CM | POA: Diagnosis not present

## 2018-07-19 DIAGNOSIS — I255 Ischemic cardiomyopathy: Secondary | ICD-10-CM | POA: Diagnosis not present

## 2018-07-19 DIAGNOSIS — I25110 Atherosclerotic heart disease of native coronary artery with unstable angina pectoris: Secondary | ICD-10-CM | POA: Diagnosis not present

## 2018-07-19 DIAGNOSIS — I428 Other cardiomyopathies: Secondary | ICD-10-CM | POA: Diagnosis not present

## 2018-07-19 DIAGNOSIS — N2581 Secondary hyperparathyroidism of renal origin: Secondary | ICD-10-CM | POA: Diagnosis not present

## 2018-07-19 DIAGNOSIS — I5020 Unspecified systolic (congestive) heart failure: Secondary | ICD-10-CM | POA: Diagnosis not present

## 2018-07-19 DIAGNOSIS — N186 End stage renal disease: Secondary | ICD-10-CM | POA: Diagnosis not present

## 2018-07-19 DIAGNOSIS — Z992 Dependence on renal dialysis: Secondary | ICD-10-CM

## 2018-07-19 DIAGNOSIS — R0600 Dyspnea, unspecified: Secondary | ICD-10-CM

## 2018-07-19 NOTE — Progress Notes (Signed)
Location:  Occupational psychologist of Service:  Clinic (12)  Provider: Nikko Goldwire L. Mariea Clonts, D.O., C.M.D.  Code Status: DNR Goals of Care:  Advanced Directives 02/18/2018  Does Patient Have a Medical Advance Directive? No;Yes  Type of Paramedic of [place];Living will  Does patient want to make changes to medical advance directive? -  Copy of [place] in Chart? -  Would patient like information on creating a medical advance directive? -  Pre-existing out of facility DNR order (yellow form or pink MOST form) -     Chief Complaint  Patient presents with  . Medical Management of Chronic Issues    81mth follow-up    HPI: Patient is a 85 y.o. male seen today for medical management of chronic diseases.    Sleeps with oxygen Monday nights and sleeps with wedge at night.  Not needing O2 at clinic on tuesdays.    He is the same weight at 164 lbs.  He's been munching while watching tv.  Had cheese and fruit last night.    EF had been down to 20-25% when I sent him to be reassessed at cardiology.  He has ischemic cardiomyopathy.  meds were tweaked.  Decision was made that pacemaker was high infection risk in his case being a dialysis patient.   He reports he can manage his own medication ok.  He cannot handle [place].  Past Medical History:  Diagnosis Date  . Anxiety   . Arthritis   . Complication of anesthesia    " one time my heart stopped due to a medication that I was on."   . ESRD (end stage renal disease) on dialysis ([place])    Tues, Thursday, Saturday; [facility]; [place] (10/10/2017)  . GERD (gastroesophageal reflux disease)   . Gout   . Heart murmur    as a teen  . Herpes zoster 04/2012  . Hypertension   . Shortness of breath dyspnea    with exertion    Past Surgical History:  Procedure Laterality Date  . AORTIC VALVE REPLACEMENT N/A 12/09/2014   Procedure: AORTIC VALVE REPLACEMENT (AVR) WITH 23MM MAGNA  EASE BIOPROSTHETIC VALVE;  Surgeon: Gaye Pollack, MD;  Location: [facility] OR;  Service: Open Heart Surgery;  Laterality: N/A;  . APPENDECTOMY  1944  . AV FISTULA PLACEMENT Left    Left Cimino AVF placed in [place]   . AV FISTULA PLACEMENT Right 03/06/2014   Procedure: ARTERIOVENOUS (AV) FISTULA CREATION-right radiocephalic;  Surgeon: Mal Misty, MD;  Location: [facility];  Service: Vascular;  Laterality: Right;  . AV FISTULA PLACEMENT Right 07/15/2014   Procedure: ARTERIOVENOUS (AV) FISTULA CREATION;  Surgeon: Elam Dutch, MD;  Location: [facility];  Service: Vascular;  Laterality: Right;  . CARDIAC CATHETERIZATION N/A 11/27/2014   Procedure: Right/Left Heart Cath and Coronary Angiography;  Surgeon: Burnell Blanks, MD;  Location: [facility];  Service: Cardiovascular;  Laterality: N/A;  . CATARACT EXTRACTION W/ INTRAOCULAR LENS IMPLANT Bilateral 2014   [facility]  . COLONOSCOPY  2013   Dr. Annamaria Helling [place], [place].  . CORONARY ARTERY BYPASS GRAFT N/A 12/09/2014   Procedure: CORONARY ARTERY BYPASS GRAFTING (CABG), ON PUMP, TIMES THREE, USING LEFT INTERNAL MAMMARY ARTERY, RIGHT GREATER SAPHENOUS VEIN HARVESTED ENDOSCOPICALLY;  Surgeon: Gaye Pollack, MD;  Location: [facility];  Service: Open Heart Surgery;  Laterality: N/A;  LIMA-LAD; SVG-OM; SVG-PD  . DIALYSIS FISTULA CREATION  08/04/2012 and  10/13   Dr. Harden Mo  . INSERTION OF DIALYSIS CATHETER Right 01-15-14   Right chest TDC placed by Dr. Augustin Coupe at [facility]  . LIGATION OF ARTERIOVENOUS  FISTULA Right 07/15/2014   Procedure: LIGATION OF ARTERIOVENOUS  FISTULA  (RIGHT RADIOCEPHALIC);  Surgeon: Elam Dutch, MD;  Location: [facility];  Service: Vascular;  Laterality: Right;  . LIGATION OF COMPETING BRANCHES OF ARTERIOVENOUS FISTULA Right 05/08/2014   Procedure: LIGATION OF COMPETING BRANCHES OF RIGHT ARM RADIOCEPHALIC ARTERIOVENOUS FISTULA;  Surgeon: Mal Misty, MD;  Location: [facility];  Service: Vascular;  Laterality: Right;   . REVISON OF ARTERIOVENOUS FISTULA Right 07/15/2014   Procedure: EXPLORATION OF ARTERIOVENOUS FISTULA;  Surgeon: Elam Dutch, MD;  Location: [facility];  Service: Vascular;  Laterality: Right;  . TEE WITHOUT CARDIOVERSION N/A 12/09/2014   Procedure: TRANSESOPHAGEAL ECHOCARDIOGRAM (TEE);  Surgeon: Gaye Pollack, MD;  Location: [facility];  Service: Open Heart Surgery;  Laterality: N/A;    No Known Allergies  Outpatient Encounter Medications as of 07/19/2018  Medication Sig  . allopurinol (ZYLOPRIM) 100 MG tablet TAKE 1 TABLET BY MOUTH DAILY  . atorvastatin (LIPITOR) 20 MG tablet TAKE 1 TABLET BY MOUTH DAILY FOR CHOLESTEROL  . calcium carbonate (TUMS - DOSED IN MG ELEMENTAL CALCIUM) 500 MG chewable tablet Chew 2 tablets by mouth daily as needed for indigestion.   . carvedilol (COREG) 3.125 MG tablet Take 1 tablet (3.125 mg total) by mouth 2 (two) times daily.  . cinacalcet (SENSIPAR) 60 MG tablet Take 60 mg by mouth. MondayWednesday, Friday, Sunday  . omeprazole (PRILOSEC) 20 MG capsule Take 20 mg by mouth daily.    No facility-administered encounter medications on file as of 07/19/2018.     Review of Systems:  Review of Systems  Constitutional: Positive for malaise/fatigue. Negative for chills, fever and weight loss.       Wt stable since last time  HENT: Positive for hearing loss.   Eyes: Negative for blurred vision (readers).  Respiratory: Negative for shortness of breath.        Sob better with wedge and oxygen on extra day b/w HD  Cardiovascular: Negative for chest pain, palpitations and leg swelling.  Gastrointestinal: Negative for abdominal pain.  Genitourinary: Negative for dysuria.       Makes very little urine   Musculoskeletal: Negative for back pain, falls and joint pain.  Skin: Negative for itching and rash.  Neurological: Negative for dizziness, loss of consciousness and headaches.  Endo/Heme/Allergies: Bruises/bleeds easily.  Psychiatric/Behavioral: Positive for memory loss.  Negative for depression. The patient is not nervous/anxious and does not have insomnia.     Health Maintenance  Topic Date Due  . INFLUENZA VACCINE  08/05/2018  . TETANUS/TDAP  06/02/2027  . PNA vac Low Risk Adult  Completed    Physical Exam: Vitals:   07/19/18 1421  BP: 120/60  Pulse: 62  Temp: 97.7 F (36.5 C)  TempSrc: Oral  SpO2: 97%  Weight: 164 lb (74.4 kg)  Height: 5\' 10"  (1.778 m)   Body mass index is 23.53 kg/m. Physical Exam Vitals signs reviewed.  Constitutional:      General: He is not in acute distress.    Appearance: He is ill-appearing (increasing sarcopenia). He is not toxic-appearing.  HENT:     Head: Normocephalic and atraumatic.  Eyes:     Comments: Reading glasses  Cardiovascular:     Rate and Rhythm: Normal rate and regular rhythm.     Pulses: Normal pulses.  Heart sounds: Murmur present.  Pulmonary:     Effort: Pulmonary effort is normal.     Comments: Left base rales Abdominal:     General: Bowel sounds are normal.     Palpations: Abdomen is soft.  Musculoskeletal: Normal range of motion.  Skin:    General: Skin is warm and dry.  Neurological:     General: No focal deficit present.     Mental Status: He is alert and oriented to person, place, and time.     Comments: Some short term memory loss  Psychiatric:        Mood and Affect: Mood normal.     Labs reviewed: Basic Metabolic Panel: Recent Labs    10/10/17 10/10/17 1149 10/11/17 0530  NA  --  131* 136  K 5.3 6.7* 4.0  CL  --  96* 98  CO2  --  19* 27  GLUCOSE  --  98 94  BUN  --  67* 32*  CREATININE  --  9.58* 6.21*  CALCIUM  --  9.7 8.7*   Liver Function Tests: Recent Labs    10/10/17 1149  AST 9*  ALT 12  ALKPHOS 55  BILITOT 0.7  PROT 7.5  ALBUMIN 3.6   No results for input(s): LIPASE, AMYLASE in the last 8760 hours. No results for input(s): AMMONIA in the last 8760 hours. CBC: Recent Labs    10/10/17 10/10/17 1149 10/11/17 0530  WBC  --  9.3 7.2   NEUTROABS  --  6.0  --   HGB 11.3* 11.9* 10.4*  HCT  --  35.9* 31.1*  MCV  --  102.6* 100.6*  PLT  --  182 145*   Lipid Panel: No results for input(s): CHOL, HDL, LDLCALC, TRIG, CHOLHDL, LDLDIRECT in the last 8760 hours. Lab Results  Component Value Date   HGBA1C 5.2 12/06/2014    Procedures since last visit: No results found.  Assessment/Plan 1. ESRD on dialysis [facility]) -continues on Tues/Thurs/Sat  2. Coronary artery disease involving native coronary artery of native heart with unstable angina pectoris ([place]) -no recent events, had MI 2016  3. Nonischemic cardiomyopathy ([place]) -with systolic chf, stable now  4. Systolic CHF with reduced left ventricular function, NYHA class 3 (HCC) -EF had declined considerably since time of his cabg in '16  5. Secondary hyperparathyroidism, renal ([place]) -managed by nephrology  6. Dyspnea on exertion -is improved now with wedge pillow and oxygen on mon night/tues am  Labs/tests ordered:  No new, he will bring his lab copies next time Next appt:  6 mos  Crissie Aloi L. Mertha Clyatt, D.O. [facility] [address], [place] [address] Cell Phone (Mon-Fri 8am-5pm):  [phone] On Call:  [phone] & follow prompts after 5pm & weekends Office Phone:  [phone] Office Fax:  [phone]

## 2018-07-20 DIAGNOSIS — D631 Anemia in chronic kidney disease: Secondary | ICD-10-CM | POA: Diagnosis not present

## 2018-07-20 DIAGNOSIS — N186 End stage renal disease: Secondary | ICD-10-CM | POA: Diagnosis not present

## 2018-07-20 DIAGNOSIS — N184 Chronic kidney disease, stage 4 (severe): Secondary | ICD-10-CM | POA: Diagnosis not present

## 2018-07-20 DIAGNOSIS — N2581 Secondary hyperparathyroidism of renal origin: Secondary | ICD-10-CM | POA: Diagnosis not present

## 2018-07-21 DIAGNOSIS — I5020 Unspecified systolic (congestive) heart failure: Secondary | ICD-10-CM | POA: Insufficient documentation

## 2018-07-22 DIAGNOSIS — D631 Anemia in chronic kidney disease: Secondary | ICD-10-CM | POA: Diagnosis not present

## 2018-07-22 DIAGNOSIS — N186 End stage renal disease: Secondary | ICD-10-CM | POA: Diagnosis not present

## 2018-07-22 DIAGNOSIS — N2581 Secondary hyperparathyroidism of renal origin: Secondary | ICD-10-CM | POA: Diagnosis not present

## 2018-07-22 DIAGNOSIS — N184 Chronic kidney disease, stage 4 (severe): Secondary | ICD-10-CM | POA: Diagnosis not present

## 2018-07-25 DIAGNOSIS — N186 End stage renal disease: Secondary | ICD-10-CM | POA: Diagnosis not present

## 2018-07-25 DIAGNOSIS — D631 Anemia in chronic kidney disease: Secondary | ICD-10-CM | POA: Diagnosis not present

## 2018-07-25 DIAGNOSIS — N2581 Secondary hyperparathyroidism of renal origin: Secondary | ICD-10-CM | POA: Diagnosis not present

## 2018-07-25 DIAGNOSIS — N184 Chronic kidney disease, stage 4 (severe): Secondary | ICD-10-CM | POA: Diagnosis not present

## 2018-07-27 DIAGNOSIS — N2581 Secondary hyperparathyroidism of renal origin: Secondary | ICD-10-CM | POA: Diagnosis not present

## 2018-07-27 DIAGNOSIS — D631 Anemia in chronic kidney disease: Secondary | ICD-10-CM | POA: Diagnosis not present

## 2018-07-27 DIAGNOSIS — N184 Chronic kidney disease, stage 4 (severe): Secondary | ICD-10-CM | POA: Diagnosis not present

## 2018-07-27 DIAGNOSIS — N186 End stage renal disease: Secondary | ICD-10-CM | POA: Diagnosis not present

## 2018-07-29 DIAGNOSIS — N2581 Secondary hyperparathyroidism of renal origin: Secondary | ICD-10-CM | POA: Diagnosis not present

## 2018-07-29 DIAGNOSIS — N186 End stage renal disease: Secondary | ICD-10-CM | POA: Diagnosis not present

## 2018-07-29 DIAGNOSIS — D631 Anemia in chronic kidney disease: Secondary | ICD-10-CM | POA: Diagnosis not present

## 2018-07-29 DIAGNOSIS — N184 Chronic kidney disease, stage 4 (severe): Secondary | ICD-10-CM | POA: Diagnosis not present

## 2018-08-01 DIAGNOSIS — N186 End stage renal disease: Secondary | ICD-10-CM | POA: Diagnosis not present

## 2018-08-01 DIAGNOSIS — N184 Chronic kidney disease, stage 4 (severe): Secondary | ICD-10-CM | POA: Diagnosis not present

## 2018-08-01 DIAGNOSIS — D631 Anemia in chronic kidney disease: Secondary | ICD-10-CM | POA: Diagnosis not present

## 2018-08-01 DIAGNOSIS — N2581 Secondary hyperparathyroidism of renal origin: Secondary | ICD-10-CM | POA: Diagnosis not present

## 2018-08-03 DIAGNOSIS — N2581 Secondary hyperparathyroidism of renal origin: Secondary | ICD-10-CM | POA: Diagnosis not present

## 2018-08-03 DIAGNOSIS — N186 End stage renal disease: Secondary | ICD-10-CM | POA: Diagnosis not present

## 2018-08-03 DIAGNOSIS — D631 Anemia in chronic kidney disease: Secondary | ICD-10-CM | POA: Diagnosis not present

## 2018-08-03 DIAGNOSIS — N184 Chronic kidney disease, stage 4 (severe): Secondary | ICD-10-CM | POA: Diagnosis not present

## 2018-08-05 DIAGNOSIS — N184 Chronic kidney disease, stage 4 (severe): Secondary | ICD-10-CM | POA: Diagnosis not present

## 2018-08-05 DIAGNOSIS — D631 Anemia in chronic kidney disease: Secondary | ICD-10-CM | POA: Diagnosis not present

## 2018-08-05 DIAGNOSIS — D509 Iron deficiency anemia, unspecified: Secondary | ICD-10-CM | POA: Diagnosis not present

## 2018-08-05 DIAGNOSIS — N186 End stage renal disease: Secondary | ICD-10-CM | POA: Diagnosis not present

## 2018-08-05 DIAGNOSIS — N2581 Secondary hyperparathyroidism of renal origin: Secondary | ICD-10-CM | POA: Diagnosis not present

## 2018-08-05 DIAGNOSIS — N2889 Other specified disorders of kidney and ureter: Secondary | ICD-10-CM | POA: Diagnosis not present

## 2018-08-05 DIAGNOSIS — Z992 Dependence on renal dialysis: Secondary | ICD-10-CM | POA: Diagnosis not present

## 2018-08-05 DIAGNOSIS — E875 Hyperkalemia: Secondary | ICD-10-CM | POA: Diagnosis not present

## 2018-08-08 DIAGNOSIS — N2581 Secondary hyperparathyroidism of renal origin: Secondary | ICD-10-CM | POA: Diagnosis not present

## 2018-08-08 DIAGNOSIS — Z992 Dependence on renal dialysis: Secondary | ICD-10-CM | POA: Diagnosis not present

## 2018-08-08 DIAGNOSIS — D509 Iron deficiency anemia, unspecified: Secondary | ICD-10-CM | POA: Diagnosis not present

## 2018-08-08 DIAGNOSIS — N186 End stage renal disease: Secondary | ICD-10-CM | POA: Diagnosis not present

## 2018-08-08 DIAGNOSIS — N184 Chronic kidney disease, stage 4 (severe): Secondary | ICD-10-CM | POA: Diagnosis not present

## 2018-08-08 DIAGNOSIS — D631 Anemia in chronic kidney disease: Secondary | ICD-10-CM | POA: Diagnosis not present

## 2018-08-10 DIAGNOSIS — N2581 Secondary hyperparathyroidism of renal origin: Secondary | ICD-10-CM | POA: Diagnosis not present

## 2018-08-10 DIAGNOSIS — D631 Anemia in chronic kidney disease: Secondary | ICD-10-CM | POA: Diagnosis not present

## 2018-08-10 DIAGNOSIS — Z992 Dependence on renal dialysis: Secondary | ICD-10-CM | POA: Diagnosis not present

## 2018-08-10 DIAGNOSIS — N184 Chronic kidney disease, stage 4 (severe): Secondary | ICD-10-CM | POA: Diagnosis not present

## 2018-08-10 DIAGNOSIS — N186 End stage renal disease: Secondary | ICD-10-CM | POA: Diagnosis not present

## 2018-08-10 DIAGNOSIS — D509 Iron deficiency anemia, unspecified: Secondary | ICD-10-CM | POA: Diagnosis not present

## 2018-08-12 DIAGNOSIS — N184 Chronic kidney disease, stage 4 (severe): Secondary | ICD-10-CM | POA: Diagnosis not present

## 2018-08-12 DIAGNOSIS — D509 Iron deficiency anemia, unspecified: Secondary | ICD-10-CM | POA: Diagnosis not present

## 2018-08-12 DIAGNOSIS — D631 Anemia in chronic kidney disease: Secondary | ICD-10-CM | POA: Diagnosis not present

## 2018-08-12 DIAGNOSIS — N2581 Secondary hyperparathyroidism of renal origin: Secondary | ICD-10-CM | POA: Diagnosis not present

## 2018-08-12 DIAGNOSIS — Z992 Dependence on renal dialysis: Secondary | ICD-10-CM | POA: Diagnosis not present

## 2018-08-12 DIAGNOSIS — N186 End stage renal disease: Secondary | ICD-10-CM | POA: Diagnosis not present

## 2018-08-15 DIAGNOSIS — D509 Iron deficiency anemia, unspecified: Secondary | ICD-10-CM | POA: Diagnosis not present

## 2018-08-15 DIAGNOSIS — N186 End stage renal disease: Secondary | ICD-10-CM | POA: Diagnosis not present

## 2018-08-15 DIAGNOSIS — N184 Chronic kidney disease, stage 4 (severe): Secondary | ICD-10-CM | POA: Diagnosis not present

## 2018-08-15 DIAGNOSIS — Z992 Dependence on renal dialysis: Secondary | ICD-10-CM | POA: Diagnosis not present

## 2018-08-15 DIAGNOSIS — N2581 Secondary hyperparathyroidism of renal origin: Secondary | ICD-10-CM | POA: Diagnosis not present

## 2018-08-15 DIAGNOSIS — D631 Anemia in chronic kidney disease: Secondary | ICD-10-CM | POA: Diagnosis not present

## 2018-08-17 DIAGNOSIS — Z992 Dependence on renal dialysis: Secondary | ICD-10-CM | POA: Diagnosis not present

## 2018-08-17 DIAGNOSIS — D631 Anemia in chronic kidney disease: Secondary | ICD-10-CM | POA: Diagnosis not present

## 2018-08-17 DIAGNOSIS — N2581 Secondary hyperparathyroidism of renal origin: Secondary | ICD-10-CM | POA: Diagnosis not present

## 2018-08-17 DIAGNOSIS — N184 Chronic kidney disease, stage 4 (severe): Secondary | ICD-10-CM | POA: Diagnosis not present

## 2018-08-17 DIAGNOSIS — N186 End stage renal disease: Secondary | ICD-10-CM | POA: Diagnosis not present

## 2018-08-17 DIAGNOSIS — D509 Iron deficiency anemia, unspecified: Secondary | ICD-10-CM | POA: Diagnosis not present

## 2018-08-19 DIAGNOSIS — N186 End stage renal disease: Secondary | ICD-10-CM | POA: Diagnosis not present

## 2018-08-19 DIAGNOSIS — Z992 Dependence on renal dialysis: Secondary | ICD-10-CM | POA: Diagnosis not present

## 2018-08-19 DIAGNOSIS — N184 Chronic kidney disease, stage 4 (severe): Secondary | ICD-10-CM | POA: Diagnosis not present

## 2018-08-19 DIAGNOSIS — D631 Anemia in chronic kidney disease: Secondary | ICD-10-CM | POA: Diagnosis not present

## 2018-08-19 DIAGNOSIS — N2581 Secondary hyperparathyroidism of renal origin: Secondary | ICD-10-CM | POA: Diagnosis not present

## 2018-08-19 DIAGNOSIS — D509 Iron deficiency anemia, unspecified: Secondary | ICD-10-CM | POA: Diagnosis not present

## 2018-08-22 DIAGNOSIS — Z992 Dependence on renal dialysis: Secondary | ICD-10-CM | POA: Diagnosis not present

## 2018-08-22 DIAGNOSIS — D631 Anemia in chronic kidney disease: Secondary | ICD-10-CM | POA: Diagnosis not present

## 2018-08-22 DIAGNOSIS — N184 Chronic kidney disease, stage 4 (severe): Secondary | ICD-10-CM | POA: Diagnosis not present

## 2018-08-22 DIAGNOSIS — N186 End stage renal disease: Secondary | ICD-10-CM | POA: Diagnosis not present

## 2018-08-22 DIAGNOSIS — N2581 Secondary hyperparathyroidism of renal origin: Secondary | ICD-10-CM | POA: Diagnosis not present

## 2018-08-22 DIAGNOSIS — D509 Iron deficiency anemia, unspecified: Secondary | ICD-10-CM | POA: Diagnosis not present

## 2018-08-24 DIAGNOSIS — N184 Chronic kidney disease, stage 4 (severe): Secondary | ICD-10-CM | POA: Diagnosis not present

## 2018-08-24 DIAGNOSIS — N186 End stage renal disease: Secondary | ICD-10-CM | POA: Diagnosis not present

## 2018-08-24 DIAGNOSIS — N2581 Secondary hyperparathyroidism of renal origin: Secondary | ICD-10-CM | POA: Diagnosis not present

## 2018-08-24 DIAGNOSIS — D631 Anemia in chronic kidney disease: Secondary | ICD-10-CM | POA: Diagnosis not present

## 2018-08-24 DIAGNOSIS — Z992 Dependence on renal dialysis: Secondary | ICD-10-CM | POA: Diagnosis not present

## 2018-08-24 DIAGNOSIS — D509 Iron deficiency anemia, unspecified: Secondary | ICD-10-CM | POA: Diagnosis not present

## 2018-08-26 DIAGNOSIS — D509 Iron deficiency anemia, unspecified: Secondary | ICD-10-CM | POA: Diagnosis not present

## 2018-08-26 DIAGNOSIS — D631 Anemia in chronic kidney disease: Secondary | ICD-10-CM | POA: Diagnosis not present

## 2018-08-26 DIAGNOSIS — Z992 Dependence on renal dialysis: Secondary | ICD-10-CM | POA: Diagnosis not present

## 2018-08-26 DIAGNOSIS — N184 Chronic kidney disease, stage 4 (severe): Secondary | ICD-10-CM | POA: Diagnosis not present

## 2018-08-26 DIAGNOSIS — N2581 Secondary hyperparathyroidism of renal origin: Secondary | ICD-10-CM | POA: Diagnosis not present

## 2018-08-26 DIAGNOSIS — N186 End stage renal disease: Secondary | ICD-10-CM | POA: Diagnosis not present

## 2018-08-29 DIAGNOSIS — D509 Iron deficiency anemia, unspecified: Secondary | ICD-10-CM | POA: Diagnosis not present

## 2018-08-29 DIAGNOSIS — N184 Chronic kidney disease, stage 4 (severe): Secondary | ICD-10-CM | POA: Diagnosis not present

## 2018-08-29 DIAGNOSIS — N2581 Secondary hyperparathyroidism of renal origin: Secondary | ICD-10-CM | POA: Diagnosis not present

## 2018-08-29 DIAGNOSIS — D631 Anemia in chronic kidney disease: Secondary | ICD-10-CM | POA: Diagnosis not present

## 2018-08-29 DIAGNOSIS — Z992 Dependence on renal dialysis: Secondary | ICD-10-CM | POA: Diagnosis not present

## 2018-08-29 DIAGNOSIS — N186 End stage renal disease: Secondary | ICD-10-CM | POA: Diagnosis not present

## 2018-08-31 DIAGNOSIS — N186 End stage renal disease: Secondary | ICD-10-CM | POA: Diagnosis not present

## 2018-08-31 DIAGNOSIS — Z992 Dependence on renal dialysis: Secondary | ICD-10-CM | POA: Diagnosis not present

## 2018-08-31 DIAGNOSIS — N184 Chronic kidney disease, stage 4 (severe): Secondary | ICD-10-CM | POA: Diagnosis not present

## 2018-08-31 DIAGNOSIS — N2581 Secondary hyperparathyroidism of renal origin: Secondary | ICD-10-CM | POA: Diagnosis not present

## 2018-08-31 DIAGNOSIS — D509 Iron deficiency anemia, unspecified: Secondary | ICD-10-CM | POA: Diagnosis not present

## 2018-08-31 DIAGNOSIS — D631 Anemia in chronic kidney disease: Secondary | ICD-10-CM | POA: Diagnosis not present

## 2018-09-02 DIAGNOSIS — N186 End stage renal disease: Secondary | ICD-10-CM | POA: Diagnosis not present

## 2018-09-02 DIAGNOSIS — D509 Iron deficiency anemia, unspecified: Secondary | ICD-10-CM | POA: Diagnosis not present

## 2018-09-02 DIAGNOSIS — D631 Anemia in chronic kidney disease: Secondary | ICD-10-CM | POA: Diagnosis not present

## 2018-09-02 DIAGNOSIS — N2581 Secondary hyperparathyroidism of renal origin: Secondary | ICD-10-CM | POA: Diagnosis not present

## 2018-09-02 DIAGNOSIS — N184 Chronic kidney disease, stage 4 (severe): Secondary | ICD-10-CM | POA: Diagnosis not present

## 2018-09-02 DIAGNOSIS — Z992 Dependence on renal dialysis: Secondary | ICD-10-CM | POA: Diagnosis not present

## 2018-09-05 DIAGNOSIS — N186 End stage renal disease: Secondary | ICD-10-CM | POA: Diagnosis not present

## 2018-09-05 DIAGNOSIS — N184 Chronic kidney disease, stage 4 (severe): Secondary | ICD-10-CM | POA: Diagnosis not present

## 2018-09-05 DIAGNOSIS — D509 Iron deficiency anemia, unspecified: Secondary | ICD-10-CM | POA: Diagnosis not present

## 2018-09-05 DIAGNOSIS — N2889 Other specified disorders of kidney and ureter: Secondary | ICD-10-CM | POA: Diagnosis not present

## 2018-09-05 DIAGNOSIS — D631 Anemia in chronic kidney disease: Secondary | ICD-10-CM | POA: Diagnosis not present

## 2018-09-05 DIAGNOSIS — Z992 Dependence on renal dialysis: Secondary | ICD-10-CM | POA: Diagnosis not present

## 2018-09-05 DIAGNOSIS — N2581 Secondary hyperparathyroidism of renal origin: Secondary | ICD-10-CM | POA: Diagnosis not present

## 2018-09-05 DIAGNOSIS — Z23 Encounter for immunization: Secondary | ICD-10-CM | POA: Diagnosis not present

## 2018-09-07 DIAGNOSIS — N186 End stage renal disease: Secondary | ICD-10-CM | POA: Diagnosis not present

## 2018-09-07 DIAGNOSIS — D631 Anemia in chronic kidney disease: Secondary | ICD-10-CM | POA: Diagnosis not present

## 2018-09-07 DIAGNOSIS — N2581 Secondary hyperparathyroidism of renal origin: Secondary | ICD-10-CM | POA: Diagnosis not present

## 2018-09-07 DIAGNOSIS — D509 Iron deficiency anemia, unspecified: Secondary | ICD-10-CM | POA: Diagnosis not present

## 2018-09-07 DIAGNOSIS — Z992 Dependence on renal dialysis: Secondary | ICD-10-CM | POA: Diagnosis not present

## 2018-09-07 DIAGNOSIS — N184 Chronic kidney disease, stage 4 (severe): Secondary | ICD-10-CM | POA: Diagnosis not present

## 2018-09-09 DIAGNOSIS — N184 Chronic kidney disease, stage 4 (severe): Secondary | ICD-10-CM | POA: Diagnosis not present

## 2018-09-09 DIAGNOSIS — N186 End stage renal disease: Secondary | ICD-10-CM | POA: Diagnosis not present

## 2018-09-09 DIAGNOSIS — D509 Iron deficiency anemia, unspecified: Secondary | ICD-10-CM | POA: Diagnosis not present

## 2018-09-09 DIAGNOSIS — N2581 Secondary hyperparathyroidism of renal origin: Secondary | ICD-10-CM | POA: Diagnosis not present

## 2018-09-09 DIAGNOSIS — D631 Anemia in chronic kidney disease: Secondary | ICD-10-CM | POA: Diagnosis not present

## 2018-09-09 DIAGNOSIS — Z992 Dependence on renal dialysis: Secondary | ICD-10-CM | POA: Diagnosis not present

## 2018-09-12 DIAGNOSIS — N2581 Secondary hyperparathyroidism of renal origin: Secondary | ICD-10-CM | POA: Diagnosis not present

## 2018-09-12 DIAGNOSIS — D509 Iron deficiency anemia, unspecified: Secondary | ICD-10-CM | POA: Diagnosis not present

## 2018-09-12 DIAGNOSIS — D631 Anemia in chronic kidney disease: Secondary | ICD-10-CM | POA: Diagnosis not present

## 2018-09-12 DIAGNOSIS — N186 End stage renal disease: Secondary | ICD-10-CM | POA: Diagnosis not present

## 2018-09-12 DIAGNOSIS — Z992 Dependence on renal dialysis: Secondary | ICD-10-CM | POA: Diagnosis not present

## 2018-09-12 DIAGNOSIS — N184 Chronic kidney disease, stage 4 (severe): Secondary | ICD-10-CM | POA: Diagnosis not present

## 2018-09-14 DIAGNOSIS — N2581 Secondary hyperparathyroidism of renal origin: Secondary | ICD-10-CM | POA: Diagnosis not present

## 2018-09-14 DIAGNOSIS — D631 Anemia in chronic kidney disease: Secondary | ICD-10-CM | POA: Diagnosis not present

## 2018-09-14 DIAGNOSIS — N184 Chronic kidney disease, stage 4 (severe): Secondary | ICD-10-CM | POA: Diagnosis not present

## 2018-09-14 DIAGNOSIS — N186 End stage renal disease: Secondary | ICD-10-CM | POA: Diagnosis not present

## 2018-09-14 DIAGNOSIS — D509 Iron deficiency anemia, unspecified: Secondary | ICD-10-CM | POA: Diagnosis not present

## 2018-09-14 DIAGNOSIS — Z992 Dependence on renal dialysis: Secondary | ICD-10-CM | POA: Diagnosis not present

## 2018-09-16 DIAGNOSIS — N186 End stage renal disease: Secondary | ICD-10-CM | POA: Diagnosis not present

## 2018-09-16 DIAGNOSIS — D631 Anemia in chronic kidney disease: Secondary | ICD-10-CM | POA: Diagnosis not present

## 2018-09-16 DIAGNOSIS — D509 Iron deficiency anemia, unspecified: Secondary | ICD-10-CM | POA: Diagnosis not present

## 2018-09-16 DIAGNOSIS — Z992 Dependence on renal dialysis: Secondary | ICD-10-CM | POA: Diagnosis not present

## 2018-09-16 DIAGNOSIS — N184 Chronic kidney disease, stage 4 (severe): Secondary | ICD-10-CM | POA: Diagnosis not present

## 2018-09-16 DIAGNOSIS — N2581 Secondary hyperparathyroidism of renal origin: Secondary | ICD-10-CM | POA: Diagnosis not present

## 2018-09-19 DIAGNOSIS — D631 Anemia in chronic kidney disease: Secondary | ICD-10-CM | POA: Diagnosis not present

## 2018-09-19 DIAGNOSIS — Z992 Dependence on renal dialysis: Secondary | ICD-10-CM | POA: Diagnosis not present

## 2018-09-19 DIAGNOSIS — N186 End stage renal disease: Secondary | ICD-10-CM | POA: Diagnosis not present

## 2018-09-19 DIAGNOSIS — D509 Iron deficiency anemia, unspecified: Secondary | ICD-10-CM | POA: Diagnosis not present

## 2018-09-19 DIAGNOSIS — N2581 Secondary hyperparathyroidism of renal origin: Secondary | ICD-10-CM | POA: Diagnosis not present

## 2018-09-19 DIAGNOSIS — N184 Chronic kidney disease, stage 4 (severe): Secondary | ICD-10-CM | POA: Diagnosis not present

## 2018-09-21 DIAGNOSIS — Z992 Dependence on renal dialysis: Secondary | ICD-10-CM | POA: Diagnosis not present

## 2018-09-21 DIAGNOSIS — N184 Chronic kidney disease, stage 4 (severe): Secondary | ICD-10-CM | POA: Diagnosis not present

## 2018-09-21 DIAGNOSIS — D509 Iron deficiency anemia, unspecified: Secondary | ICD-10-CM | POA: Diagnosis not present

## 2018-09-21 DIAGNOSIS — N186 End stage renal disease: Secondary | ICD-10-CM | POA: Diagnosis not present

## 2018-09-21 DIAGNOSIS — N2581 Secondary hyperparathyroidism of renal origin: Secondary | ICD-10-CM | POA: Diagnosis not present

## 2018-09-21 DIAGNOSIS — D631 Anemia in chronic kidney disease: Secondary | ICD-10-CM | POA: Diagnosis not present

## 2018-09-23 DIAGNOSIS — D631 Anemia in chronic kidney disease: Secondary | ICD-10-CM | POA: Diagnosis not present

## 2018-09-23 DIAGNOSIS — D509 Iron deficiency anemia, unspecified: Secondary | ICD-10-CM | POA: Diagnosis not present

## 2018-09-23 DIAGNOSIS — N184 Chronic kidney disease, stage 4 (severe): Secondary | ICD-10-CM | POA: Diagnosis not present

## 2018-09-23 DIAGNOSIS — Z992 Dependence on renal dialysis: Secondary | ICD-10-CM | POA: Diagnosis not present

## 2018-09-23 DIAGNOSIS — N2581 Secondary hyperparathyroidism of renal origin: Secondary | ICD-10-CM | POA: Diagnosis not present

## 2018-09-23 DIAGNOSIS — N186 End stage renal disease: Secondary | ICD-10-CM | POA: Diagnosis not present

## 2018-09-26 DIAGNOSIS — D509 Iron deficiency anemia, unspecified: Secondary | ICD-10-CM | POA: Diagnosis not present

## 2018-09-26 DIAGNOSIS — N2581 Secondary hyperparathyroidism of renal origin: Secondary | ICD-10-CM | POA: Diagnosis not present

## 2018-09-26 DIAGNOSIS — N184 Chronic kidney disease, stage 4 (severe): Secondary | ICD-10-CM | POA: Diagnosis not present

## 2018-09-26 DIAGNOSIS — Z992 Dependence on renal dialysis: Secondary | ICD-10-CM | POA: Diagnosis not present

## 2018-09-26 DIAGNOSIS — D631 Anemia in chronic kidney disease: Secondary | ICD-10-CM | POA: Diagnosis not present

## 2018-09-26 DIAGNOSIS — N186 End stage renal disease: Secondary | ICD-10-CM | POA: Diagnosis not present

## 2018-09-28 DIAGNOSIS — N184 Chronic kidney disease, stage 4 (severe): Secondary | ICD-10-CM | POA: Diagnosis not present

## 2018-09-28 DIAGNOSIS — D509 Iron deficiency anemia, unspecified: Secondary | ICD-10-CM | POA: Diagnosis not present

## 2018-09-28 DIAGNOSIS — N2581 Secondary hyperparathyroidism of renal origin: Secondary | ICD-10-CM | POA: Diagnosis not present

## 2018-09-28 DIAGNOSIS — Z992 Dependence on renal dialysis: Secondary | ICD-10-CM | POA: Diagnosis not present

## 2018-09-28 DIAGNOSIS — N186 End stage renal disease: Secondary | ICD-10-CM | POA: Diagnosis not present

## 2018-09-28 DIAGNOSIS — D631 Anemia in chronic kidney disease: Secondary | ICD-10-CM | POA: Diagnosis not present

## 2018-09-30 DIAGNOSIS — Z992 Dependence on renal dialysis: Secondary | ICD-10-CM | POA: Diagnosis not present

## 2018-09-30 DIAGNOSIS — D631 Anemia in chronic kidney disease: Secondary | ICD-10-CM | POA: Diagnosis not present

## 2018-09-30 DIAGNOSIS — D509 Iron deficiency anemia, unspecified: Secondary | ICD-10-CM | POA: Diagnosis not present

## 2018-09-30 DIAGNOSIS — N186 End stage renal disease: Secondary | ICD-10-CM | POA: Diagnosis not present

## 2018-09-30 DIAGNOSIS — N2581 Secondary hyperparathyroidism of renal origin: Secondary | ICD-10-CM | POA: Diagnosis not present

## 2018-09-30 DIAGNOSIS — N184 Chronic kidney disease, stage 4 (severe): Secondary | ICD-10-CM | POA: Diagnosis not present

## 2018-10-03 DIAGNOSIS — Z992 Dependence on renal dialysis: Secondary | ICD-10-CM | POA: Diagnosis not present

## 2018-10-03 DIAGNOSIS — N184 Chronic kidney disease, stage 4 (severe): Secondary | ICD-10-CM | POA: Diagnosis not present

## 2018-10-03 DIAGNOSIS — N186 End stage renal disease: Secondary | ICD-10-CM | POA: Diagnosis not present

## 2018-10-03 DIAGNOSIS — N2581 Secondary hyperparathyroidism of renal origin: Secondary | ICD-10-CM | POA: Diagnosis not present

## 2018-10-03 DIAGNOSIS — D509 Iron deficiency anemia, unspecified: Secondary | ICD-10-CM | POA: Diagnosis not present

## 2018-10-03 DIAGNOSIS — D631 Anemia in chronic kidney disease: Secondary | ICD-10-CM | POA: Diagnosis not present

## 2018-10-05 DIAGNOSIS — N184 Chronic kidney disease, stage 4 (severe): Secondary | ICD-10-CM | POA: Diagnosis not present

## 2018-10-05 DIAGNOSIS — Z992 Dependence on renal dialysis: Secondary | ICD-10-CM | POA: Diagnosis not present

## 2018-10-05 DIAGNOSIS — N186 End stage renal disease: Secondary | ICD-10-CM | POA: Diagnosis not present

## 2018-10-05 DIAGNOSIS — D631 Anemia in chronic kidney disease: Secondary | ICD-10-CM | POA: Diagnosis not present

## 2018-10-05 DIAGNOSIS — N2889 Other specified disorders of kidney and ureter: Secondary | ICD-10-CM | POA: Diagnosis not present

## 2018-10-05 DIAGNOSIS — J189 Pneumonia, unspecified organism: Secondary | ICD-10-CM

## 2018-10-05 DIAGNOSIS — N2581 Secondary hyperparathyroidism of renal origin: Secondary | ICD-10-CM | POA: Diagnosis not present

## 2018-10-05 DIAGNOSIS — D509 Iron deficiency anemia, unspecified: Secondary | ICD-10-CM | POA: Diagnosis not present

## 2018-10-05 HISTORY — DX: Pneumonia, unspecified organism: J18.9

## 2018-10-07 DIAGNOSIS — D631 Anemia in chronic kidney disease: Secondary | ICD-10-CM | POA: Diagnosis not present

## 2018-10-07 DIAGNOSIS — N184 Chronic kidney disease, stage 4 (severe): Secondary | ICD-10-CM | POA: Diagnosis not present

## 2018-10-07 DIAGNOSIS — N186 End stage renal disease: Secondary | ICD-10-CM | POA: Diagnosis not present

## 2018-10-07 DIAGNOSIS — Z992 Dependence on renal dialysis: Secondary | ICD-10-CM | POA: Diagnosis not present

## 2018-10-07 DIAGNOSIS — N2581 Secondary hyperparathyroidism of renal origin: Secondary | ICD-10-CM | POA: Diagnosis not present

## 2018-10-07 DIAGNOSIS — D509 Iron deficiency anemia, unspecified: Secondary | ICD-10-CM | POA: Diagnosis not present

## 2018-10-10 DIAGNOSIS — N2581 Secondary hyperparathyroidism of renal origin: Secondary | ICD-10-CM | POA: Diagnosis not present

## 2018-10-10 DIAGNOSIS — D509 Iron deficiency anemia, unspecified: Secondary | ICD-10-CM | POA: Diagnosis not present

## 2018-10-10 DIAGNOSIS — Z992 Dependence on renal dialysis: Secondary | ICD-10-CM | POA: Diagnosis not present

## 2018-10-10 DIAGNOSIS — N184 Chronic kidney disease, stage 4 (severe): Secondary | ICD-10-CM | POA: Diagnosis not present

## 2018-10-10 DIAGNOSIS — D631 Anemia in chronic kidney disease: Secondary | ICD-10-CM | POA: Diagnosis not present

## 2018-10-10 DIAGNOSIS — N186 End stage renal disease: Secondary | ICD-10-CM | POA: Diagnosis not present

## 2018-10-12 DIAGNOSIS — D509 Iron deficiency anemia, unspecified: Secondary | ICD-10-CM | POA: Diagnosis not present

## 2018-10-12 DIAGNOSIS — D631 Anemia in chronic kidney disease: Secondary | ICD-10-CM | POA: Diagnosis not present

## 2018-10-12 DIAGNOSIS — Z992 Dependence on renal dialysis: Secondary | ICD-10-CM | POA: Diagnosis not present

## 2018-10-12 DIAGNOSIS — N186 End stage renal disease: Secondary | ICD-10-CM | POA: Diagnosis not present

## 2018-10-12 DIAGNOSIS — N184 Chronic kidney disease, stage 4 (severe): Secondary | ICD-10-CM | POA: Diagnosis not present

## 2018-10-12 DIAGNOSIS — N2581 Secondary hyperparathyroidism of renal origin: Secondary | ICD-10-CM | POA: Diagnosis not present

## 2018-10-14 DIAGNOSIS — N186 End stage renal disease: Secondary | ICD-10-CM | POA: Diagnosis not present

## 2018-10-14 DIAGNOSIS — N2581 Secondary hyperparathyroidism of renal origin: Secondary | ICD-10-CM | POA: Diagnosis not present

## 2018-10-14 DIAGNOSIS — D631 Anemia in chronic kidney disease: Secondary | ICD-10-CM | POA: Diagnosis not present

## 2018-10-14 DIAGNOSIS — D509 Iron deficiency anemia, unspecified: Secondary | ICD-10-CM | POA: Diagnosis not present

## 2018-10-14 DIAGNOSIS — N184 Chronic kidney disease, stage 4 (severe): Secondary | ICD-10-CM | POA: Diagnosis not present

## 2018-10-14 DIAGNOSIS — Z992 Dependence on renal dialysis: Secondary | ICD-10-CM | POA: Diagnosis not present

## 2018-10-17 DIAGNOSIS — Z992 Dependence on renal dialysis: Secondary | ICD-10-CM | POA: Diagnosis not present

## 2018-10-17 DIAGNOSIS — N2581 Secondary hyperparathyroidism of renal origin: Secondary | ICD-10-CM | POA: Diagnosis not present

## 2018-10-17 DIAGNOSIS — D631 Anemia in chronic kidney disease: Secondary | ICD-10-CM | POA: Diagnosis not present

## 2018-10-17 DIAGNOSIS — N186 End stage renal disease: Secondary | ICD-10-CM | POA: Diagnosis not present

## 2018-10-17 DIAGNOSIS — D509 Iron deficiency anemia, unspecified: Secondary | ICD-10-CM | POA: Diagnosis not present

## 2018-10-17 DIAGNOSIS — N184 Chronic kidney disease, stage 4 (severe): Secondary | ICD-10-CM | POA: Diagnosis not present

## 2018-10-19 DIAGNOSIS — D509 Iron deficiency anemia, unspecified: Secondary | ICD-10-CM | POA: Diagnosis not present

## 2018-10-19 DIAGNOSIS — N186 End stage renal disease: Secondary | ICD-10-CM | POA: Diagnosis not present

## 2018-10-19 DIAGNOSIS — Z992 Dependence on renal dialysis: Secondary | ICD-10-CM | POA: Diagnosis not present

## 2018-10-19 DIAGNOSIS — N2581 Secondary hyperparathyroidism of renal origin: Secondary | ICD-10-CM | POA: Diagnosis not present

## 2018-10-19 DIAGNOSIS — D631 Anemia in chronic kidney disease: Secondary | ICD-10-CM | POA: Diagnosis not present

## 2018-10-19 DIAGNOSIS — N184 Chronic kidney disease, stage 4 (severe): Secondary | ICD-10-CM | POA: Diagnosis not present

## 2018-10-21 DIAGNOSIS — Z992 Dependence on renal dialysis: Secondary | ICD-10-CM | POA: Diagnosis not present

## 2018-10-21 DIAGNOSIS — N2581 Secondary hyperparathyroidism of renal origin: Secondary | ICD-10-CM | POA: Diagnosis not present

## 2018-10-21 DIAGNOSIS — D509 Iron deficiency anemia, unspecified: Secondary | ICD-10-CM | POA: Diagnosis not present

## 2018-10-21 DIAGNOSIS — N186 End stage renal disease: Secondary | ICD-10-CM | POA: Diagnosis not present

## 2018-10-21 DIAGNOSIS — N184 Chronic kidney disease, stage 4 (severe): Secondary | ICD-10-CM | POA: Diagnosis not present

## 2018-10-21 DIAGNOSIS — D631 Anemia in chronic kidney disease: Secondary | ICD-10-CM | POA: Diagnosis not present

## 2018-10-24 DIAGNOSIS — D509 Iron deficiency anemia, unspecified: Secondary | ICD-10-CM | POA: Diagnosis not present

## 2018-10-24 DIAGNOSIS — N186 End stage renal disease: Secondary | ICD-10-CM | POA: Diagnosis not present

## 2018-10-24 DIAGNOSIS — N2581 Secondary hyperparathyroidism of renal origin: Secondary | ICD-10-CM | POA: Diagnosis not present

## 2018-10-24 DIAGNOSIS — D631 Anemia in chronic kidney disease: Secondary | ICD-10-CM | POA: Diagnosis not present

## 2018-10-24 DIAGNOSIS — Z992 Dependence on renal dialysis: Secondary | ICD-10-CM | POA: Diagnosis not present

## 2018-10-24 DIAGNOSIS — N184 Chronic kidney disease, stage 4 (severe): Secondary | ICD-10-CM | POA: Diagnosis not present

## 2018-10-26 DIAGNOSIS — D631 Anemia in chronic kidney disease: Secondary | ICD-10-CM | POA: Diagnosis not present

## 2018-10-26 DIAGNOSIS — N2581 Secondary hyperparathyroidism of renal origin: Secondary | ICD-10-CM | POA: Diagnosis not present

## 2018-10-26 DIAGNOSIS — Z992 Dependence on renal dialysis: Secondary | ICD-10-CM | POA: Diagnosis not present

## 2018-10-26 DIAGNOSIS — N184 Chronic kidney disease, stage 4 (severe): Secondary | ICD-10-CM | POA: Diagnosis not present

## 2018-10-26 DIAGNOSIS — N186 End stage renal disease: Secondary | ICD-10-CM | POA: Diagnosis not present

## 2018-10-26 DIAGNOSIS — D509 Iron deficiency anemia, unspecified: Secondary | ICD-10-CM | POA: Diagnosis not present

## 2018-10-28 DIAGNOSIS — D631 Anemia in chronic kidney disease: Secondary | ICD-10-CM | POA: Diagnosis not present

## 2018-10-28 DIAGNOSIS — Z992 Dependence on renal dialysis: Secondary | ICD-10-CM | POA: Diagnosis not present

## 2018-10-28 DIAGNOSIS — D509 Iron deficiency anemia, unspecified: Secondary | ICD-10-CM | POA: Diagnosis not present

## 2018-10-28 DIAGNOSIS — N186 End stage renal disease: Secondary | ICD-10-CM | POA: Diagnosis not present

## 2018-10-28 DIAGNOSIS — N2581 Secondary hyperparathyroidism of renal origin: Secondary | ICD-10-CM | POA: Diagnosis not present

## 2018-10-28 DIAGNOSIS — N184 Chronic kidney disease, stage 4 (severe): Secondary | ICD-10-CM | POA: Diagnosis not present

## 2018-10-31 ENCOUNTER — Other Ambulatory Visit: Payer: Self-pay

## 2018-10-31 ENCOUNTER — Encounter (HOSPITAL_COMMUNITY): Payer: Self-pay | Admitting: Emergency Medicine

## 2018-10-31 ENCOUNTER — Emergency Department (HOSPITAL_COMMUNITY): Payer: Medicare Other

## 2018-10-31 ENCOUNTER — Inpatient Hospital Stay (HOSPITAL_COMMUNITY)
Admission: EM | Admit: 2018-10-31 | Discharge: 2018-11-04 | DRG: 871 | Disposition: A | Discharge status: Home / Self Care | Payer: Medicare Other | Attending: Internal Medicine | Admitting: Internal Medicine

## 2018-10-31 DIAGNOSIS — E8770 Fluid overload, unspecified: Secondary | ICD-10-CM

## 2018-10-31 DIAGNOSIS — M109 Gout, unspecified: Secondary | ICD-10-CM | POA: Diagnosis present

## 2018-10-31 DIAGNOSIS — I132 Hypertensive heart and chronic kidney disease with heart failure and with stage 5 chronic kidney disease, or end stage renal disease: Secondary | ICD-10-CM | POA: Diagnosis not present

## 2018-10-31 DIAGNOSIS — J189 Pneumonia, unspecified organism: Secondary | ICD-10-CM

## 2018-10-31 DIAGNOSIS — I428 Other cardiomyopathies: Secondary | ICD-10-CM | POA: Diagnosis present

## 2018-10-31 DIAGNOSIS — N186 End stage renal disease: Secondary | ICD-10-CM | POA: Diagnosis not present

## 2018-10-31 DIAGNOSIS — Z992 Dependence on renal dialysis: Secondary | ICD-10-CM | POA: Diagnosis not present

## 2018-10-31 DIAGNOSIS — Z79899 Other long term (current) drug therapy: Secondary | ICD-10-CM

## 2018-10-31 DIAGNOSIS — Z20828 Contact with and (suspected) exposure to other viral communicable diseases: Secondary | ICD-10-CM | POA: Diagnosis present

## 2018-10-31 DIAGNOSIS — F1090 Alcohol use, unspecified, uncomplicated: Secondary | ICD-10-CM

## 2018-10-31 DIAGNOSIS — Z789 Other specified health status: Secondary | ICD-10-CM

## 2018-10-31 DIAGNOSIS — J181 Lobar pneumonia, unspecified organism: Secondary | ICD-10-CM | POA: Diagnosis not present

## 2018-10-31 DIAGNOSIS — I10 Essential (primary) hypertension: Secondary | ICD-10-CM | POA: Diagnosis present

## 2018-10-31 DIAGNOSIS — D62 Acute posthemorrhagic anemia: Secondary | ICD-10-CM | POA: Diagnosis not present

## 2018-10-31 DIAGNOSIS — Z961 Presence of intraocular lens: Secondary | ICD-10-CM | POA: Diagnosis present

## 2018-10-31 DIAGNOSIS — R531 Weakness: Secondary | ICD-10-CM | POA: Diagnosis not present

## 2018-10-31 DIAGNOSIS — I953 Hypotension of hemodialysis: Secondary | ICD-10-CM | POA: Diagnosis not present

## 2018-10-31 DIAGNOSIS — D631 Anemia in chronic kidney disease: Secondary | ICD-10-CM | POA: Diagnosis not present

## 2018-10-31 DIAGNOSIS — A419 Sepsis, unspecified organism: Principal | ICD-10-CM | POA: Diagnosis present

## 2018-10-31 DIAGNOSIS — Z952 Presence of prosthetic heart valve: Secondary | ICD-10-CM

## 2018-10-31 DIAGNOSIS — N184 Chronic kidney disease, stage 4 (severe): Secondary | ICD-10-CM | POA: Diagnosis not present

## 2018-10-31 DIAGNOSIS — Z9842 Cataract extraction status, left eye: Secondary | ICD-10-CM

## 2018-10-31 DIAGNOSIS — I2510 Atherosclerotic heart disease of native coronary artery without angina pectoris: Secondary | ICD-10-CM | POA: Diagnosis present

## 2018-10-31 DIAGNOSIS — R0902 Hypoxemia: Secondary | ICD-10-CM | POA: Diagnosis not present

## 2018-10-31 DIAGNOSIS — N2581 Secondary hyperparathyroidism of renal origin: Secondary | ICD-10-CM | POA: Diagnosis present

## 2018-10-31 DIAGNOSIS — I447 Left bundle-branch block, unspecified: Secondary | ICD-10-CM | POA: Diagnosis not present

## 2018-10-31 DIAGNOSIS — Z951 Presence of aortocoronary bypass graft: Secondary | ICD-10-CM | POA: Diagnosis not present

## 2018-10-31 DIAGNOSIS — R5383 Other fatigue: Secondary | ICD-10-CM | POA: Diagnosis not present

## 2018-10-31 DIAGNOSIS — R55 Syncope and collapse: Secondary | ICD-10-CM | POA: Diagnosis present

## 2018-10-31 DIAGNOSIS — E785 Hyperlipidemia, unspecified: Secondary | ICD-10-CM | POA: Diagnosis present

## 2018-10-31 DIAGNOSIS — Z9841 Cataract extraction status, right eye: Secondary | ICD-10-CM

## 2018-10-31 DIAGNOSIS — I5022 Chronic systolic (congestive) heart failure: Secondary | ICD-10-CM | POA: Diagnosis not present

## 2018-10-31 DIAGNOSIS — D509 Iron deficiency anemia, unspecified: Secondary | ICD-10-CM | POA: Diagnosis not present

## 2018-10-31 DIAGNOSIS — I959 Hypotension, unspecified: Secondary | ICD-10-CM | POA: Diagnosis present

## 2018-10-31 DIAGNOSIS — E875 Hyperkalemia: Secondary | ICD-10-CM | POA: Diagnosis not present

## 2018-10-31 DIAGNOSIS — J690 Pneumonitis due to inhalation of food and vomit: Secondary | ICD-10-CM | POA: Diagnosis present

## 2018-10-31 DIAGNOSIS — R945 Abnormal results of liver function studies: Secondary | ICD-10-CM | POA: Diagnosis present

## 2018-10-31 DIAGNOSIS — Z7289 Other problems related to lifestyle: Secondary | ICD-10-CM

## 2018-10-31 DIAGNOSIS — K219 Gastro-esophageal reflux disease without esophagitis: Secondary | ICD-10-CM | POA: Diagnosis present

## 2018-10-31 DIAGNOSIS — R7989 Other specified abnormal findings of blood chemistry: Secondary | ICD-10-CM | POA: Diagnosis present

## 2018-10-31 DIAGNOSIS — Z8619 Personal history of other infectious and parasitic diseases: Secondary | ICD-10-CM

## 2018-10-31 LAB — CBC WITH DIFFERENTIAL/PLATELET
Abs Immature Granulocytes: 0.18 10*3/uL — ABNORMAL HIGH (ref 0.00–0.07)
Basophils Absolute: 0.1 10*3/uL (ref 0.0–0.1)
Basophils Relative: 0 %
Eosinophils Absolute: 0 10*3/uL (ref 0.0–0.5)
Eosinophils Relative: 0 %
HCT: 38.5 % — ABNORMAL LOW (ref 39.0–52.0)
Hemoglobin: 12.9 g/dL — ABNORMAL LOW (ref 13.0–17.0)
Immature Granulocytes: 1 %
Lymphocytes Relative: 2 %
Lymphs Abs: 0.5 10*3/uL — ABNORMAL LOW (ref 0.7–4.0)
MCH: 34 pg (ref 26.0–34.0)
MCHC: 33.5 g/dL (ref 30.0–36.0)
MCV: 101.6 fL — ABNORMAL HIGH (ref 80.0–100.0)
Monocytes Absolute: 1.3 10*3/uL — ABNORMAL HIGH (ref 0.1–1.0)
Monocytes Relative: 7 %
Neutro Abs: 17.8 10*3/uL — ABNORMAL HIGH (ref 1.7–7.7)
Neutrophils Relative %: 90 %
Platelets: 169 10*3/uL (ref 150–400)
RBC: 3.79 MIL/uL — ABNORMAL LOW (ref 4.22–5.81)
RDW: 13.2 % (ref 11.5–15.5)
WBC: 19.8 10*3/uL — ABNORMAL HIGH (ref 4.0–10.5)
nRBC: 0 % (ref 0.0–0.2)

## 2018-10-31 LAB — COMPREHENSIVE METABOLIC PANEL
ALT: 21 U/L (ref 0–44)
AST: 115 U/L — ABNORMAL HIGH (ref 15–41)
Albumin: 3.6 g/dL (ref 3.5–5.0)
Alkaline Phosphatase: 63 U/L (ref 38–126)
Anion gap: 17 — ABNORMAL HIGH (ref 5–15)
BUN: 31 mg/dL — ABNORMAL HIGH (ref 8–23)
CO2: 22 mmol/L (ref 22–32)
Calcium: 9.5 mg/dL (ref 8.9–10.3)
Chloride: 96 mmol/L — ABNORMAL LOW (ref 98–111)
Creatinine, Ser: 6.07 mg/dL — ABNORMAL HIGH (ref 0.61–1.24)
GFR calc Af Amer: 9 mL/min — ABNORMAL LOW (ref >60)
GFR calc non Af Amer: 8 mL/min — ABNORMAL LOW (ref >60)
Glucose, Bld: 107 mg/dL — ABNORMAL HIGH (ref 70–99)
Potassium: 6 mmol/L — ABNORMAL HIGH (ref 3.5–5.1)
Sodium: 135 mmol/L (ref 135–145)
Total Bilirubin: 1.2 mg/dL (ref 0.3–1.2)
Total Protein: 7.7 g/dL (ref 6.5–8.1)

## 2018-10-31 LAB — LIPASE, BLOOD: Lipase: 34 U/L (ref 11–51)

## 2018-10-31 LAB — LACTIC ACID, PLASMA: Lactic Acid, Venous: 1.9 mmol/L (ref 0.5–1.9)

## 2018-10-31 MED ORDER — DEXTROSE 50 % IV SOLN
1.0000 | Freq: Once | INTRAVENOUS | Status: AC
  Administered 2018-10-31: 50 mL via INTRAVENOUS
  Filled 2018-10-31: qty 50

## 2018-10-31 MED ORDER — SODIUM CHLORIDE 0.9 % IV BOLUS
500.0000 mL | Freq: Once | INTRAVENOUS | Status: AC
  Administered 2018-10-31: 500 mL via INTRAVENOUS

## 2018-10-31 MED ORDER — SODIUM CHLORIDE 0.9 % IV SOLN
2.0000 g | INTRAVENOUS | Status: DC
  Administered 2018-10-31: 2 g via INTRAVENOUS
  Filled 2018-10-31: qty 20

## 2018-10-31 MED ORDER — SODIUM CHLORIDE 0.9 % IV SOLN
INTRAVENOUS | Status: DC
  Administered 2018-11-01: via INTRAVENOUS

## 2018-10-31 MED ORDER — SODIUM CHLORIDE 0.9 % IV SOLN: INTRAVENOUS | Status: DC

## 2018-10-31 MED ORDER — HEPARIN SODIUM (PORCINE) 5000 UNIT/ML IJ SOLN
5000.0000 [IU] | Freq: Three times a day (TID) | INTRAMUSCULAR | Status: DC
  Administered 2018-11-01 – 2018-11-03 (×8): 5000 [IU] via SUBCUTANEOUS
  Filled 2018-10-31 (×10): qty 1

## 2018-10-31 MED ORDER — ACETAMINOPHEN 325 MG PO TABS: 650.0000 mg | ORAL_TABLET | Freq: Four times a day (QID) | ORAL | Status: DC | PRN

## 2018-10-31 MED ORDER — DM-GUAIFENESIN ER 30-600 MG PO TB12
1.0000 | ORAL_TABLET | Freq: Two times a day (BID) | ORAL | Status: DC | PRN
  Administered 2018-11-03: 1 via ORAL
  Filled 2018-10-31: qty 1

## 2018-10-31 MED ORDER — ALBUTEROL SULFATE HFA 108 (90 BASE) MCG/ACT IN AERS
2.0000 | INHALATION_SPRAY | RESPIRATORY_TRACT | Status: DC | PRN
  Filled 2018-10-31: qty 6.7

## 2018-10-31 MED ORDER — DEXTROSE 50 % IV SOLN
1.0000 | Freq: Once | INTRAVENOUS | Status: AC
  Administered 2018-11-01: 50 mL via INTRAVENOUS
  Filled 2018-10-31: qty 50

## 2018-10-31 MED ORDER — SODIUM ZIRCONIUM CYCLOSILICATE 10 G PO PACK
10.0000 g | PACK | Freq: Once | ORAL | Status: AC
  Administered 2018-10-31: 10 g via ORAL
  Filled 2018-10-31: qty 1

## 2018-10-31 MED ORDER — ALBUTEROL SULFATE HFA 108 (90 BASE) MCG/ACT IN AERS
1.0000 | INHALATION_SPRAY | Freq: Once | RESPIRATORY_TRACT | Status: AC
  Administered 2018-10-31: 2 via RESPIRATORY_TRACT
  Filled 2018-10-31: qty 6.7

## 2018-10-31 MED ORDER — SODIUM CHLORIDE 0.9 % IV SOLN
500.0000 mg | INTRAVENOUS | Status: DC
  Administered 2018-10-31: 500 mg via INTRAVENOUS
  Filled 2018-10-31: qty 500

## 2018-10-31 MED ORDER — MIDODRINE HCL 5 MG PO TABS
10.0000 mg | ORAL_TABLET | Freq: Three times a day (TID) | ORAL | Status: DC
  Administered 2018-11-01 – 2018-11-04 (×8): 10 mg via ORAL
  Filled 2018-10-31 (×9): qty 2

## 2018-10-31 MED ORDER — INSULIN ASPART 100 UNIT/ML IV SOLN
10.0000 [IU] | Freq: Once | INTRAVENOUS | Status: AC
  Administered 2018-10-31: 10 [IU] via INTRAVENOUS

## 2018-10-31 NOTE — H&P (Addendum)
History and Physical    Eric [name] TKZ:[id number] DOB: 1930/01/31 DOA: 10/31/2018  Referring MD/NP/PA:   PCP: Gayland Curry, DO   Patient coming from:  The patient is coming from independent living facilty.  At baseline, pt is independent for most of ADL.        Chief Complaint: syncope  HPI: Eric [name] is a 85 y.o. male with medical history significant of ESRD-HD (TTS), HTN, HLD, GERD, gout, AV replacement (12/09/14), CAD, CABG, sCHF with EF 20-25%, alcohol use, who presents with syncope.  Pt states that he was feeling normal this AM. He went to dialysis and completed his normal dialysis session. Patient reports immediately after finishing dialysis he passed out while sitting in the chair. No fall. No any injury. He does not remember what happened exactly. He states this has happened in the past. He had nausea and vomited a little bit.  Currently no nausea,vomiting, diarrhea or abdominal pain. EMS reports orthostatic vitals. Pt was hypotensive with Bp 71/47 which improved to 119/60 after given 500 cc of NS in ED. patient denies chest pain, shortness of breath, cough, fever or chills.  No symptoms of UTI.  He has generalized weakness and fatigue, but no unilateral numbness or tingling his extremities.  No facial droop or slurred speech.  ED Course: pt was found to have WBC 19.8, lactic acid of 1.9, pending COVID-19 test, potassium 6.0, bicarbonate 22, creatinine 6.07, BUN 31, abnormal liver function (ALP 63, AST 115, ALT 21, total bilirubin 1.2), temperature normal, heart rate 70 to 80s, tachypnea, oxygen saturation 94% on room air.  Chest x-ray showed right lower lobe patchy infiltration.  Patient is placed on telemetry bed for observation.  Review of Systems:   General: no fevers, chills, no body weight gain, has fatigue HEENT: no blurry vision, hearing changes or sore throat Respiratory: no dyspnea, coughing, wheezing CV: no chest pain, no palpitations GI: had nausea, vomiting, no  abdominal pain, diarrhea, constipation GU: no dysuria, burning on urination, increased urinary frequency, hematuria  Ext: no leg edema Neuro: no unilateral weakness, numbness, or tingling, no vision change or hearing loss. Had syncope. Skin: no rash, no skin tear. MSK: No muscle spasm, no deformity, no limitation of range of movement in spin Heme: No easy bruising.  Travel history: No recent long distant travel.  Allergy: No Known Allergies  Past Medical History:  Diagnosis Date  . Anxiety   . Arthritis   . Complication of anesthesia    " one time my heart stopped due to a medication that I was on."   . ESRD (end stage renal disease) on dialysis ([place])    Tues, Thursday, Saturday; [facility]; [place] (10/10/2017)  . GERD (gastroesophageal reflux disease)   . Gout   . Heart murmur    as a teen  . Herpes zoster 04/2012  . Hypertension   . Shortness of breath dyspnea    with exertion    Past Surgical History:  Procedure Laterality Date  . AORTIC VALVE REPLACEMENT N/A 12/09/2014   Procedure: AORTIC VALVE REPLACEMENT (AVR) WITH 23MM MAGNA EASE BIOPROSTHETIC VALVE;  Surgeon: Gaye Pollack, MD;  Location: [facility];  Service: Open Heart Surgery;  Laterality: N/A;  . APPENDECTOMY  1944  . AV FISTULA PLACEMENT Left    Left [place] AVF placed in [place]   . AV FISTULA PLACEMENT Right 03/06/2014   Procedure: ARTERIOVENOUS (AV) FISTULA CREATION-right radiocephalic;  Surgeon: Mal Misty, MD;  Location: [facility];  Service: Vascular;  Laterality: Right;  . AV FISTULA PLACEMENT Right 07/15/2014   Procedure: ARTERIOVENOUS (AV) FISTULA CREATION;  Surgeon: Elam Dutch, MD;  Location: [facility];  Service: Vascular;  Laterality: Right;  . CARDIAC CATHETERIZATION N/A 11/27/2014   Procedure: Right/Left Heart Cath and Coronary Angiography;  Surgeon: Burnell Blanks, MD;  Location: [facility];  Service: Cardiovascular;  Laterality: N/A;  . CATARACT EXTRACTION W/ INTRAOCULAR  LENS IMPLANT Bilateral 2014   [facility]  . COLONOSCOPY  2013   Dr. Annamaria Helling [place], [place].  . CORONARY ARTERY BYPASS GRAFT N/A 12/09/2014   Procedure: CORONARY ARTERY BYPASS GRAFTING (CABG), ON PUMP, TIMES THREE, USING LEFT INTERNAL MAMMARY ARTERY, RIGHT GREATER SAPHENOUS VEIN HARVESTED ENDOSCOPICALLY;  Surgeon: Gaye Pollack, MD;  Location: [facility];  Service: Open Heart Surgery;  Laterality: N/A;  LIMA-LAD; SVG-OM; SVG-PD  . DIALYSIS FISTULA CREATION  08/04/2012 and 10/13   Dr. Harden Mo  . INSERTION OF DIALYSIS CATHETER Right 01-15-14   Right chest TDC placed by Dr. Augustin Coupe at [facility]  . LIGATION OF ARTERIOVENOUS  FISTULA Right 07/15/2014   Procedure: LIGATION OF ARTERIOVENOUS  FISTULA  (RIGHT RADIOCEPHALIC);  Surgeon: Elam Dutch, MD;  Location: [facility];  Service: Vascular;  Laterality: Right;  . LIGATION OF COMPETING BRANCHES OF ARTERIOVENOUS FISTULA Right 05/08/2014   Procedure: LIGATION OF COMPETING BRANCHES OF RIGHT ARM RADIOCEPHALIC ARTERIOVENOUS FISTULA;  Surgeon: Mal Misty, MD;  Location: [facility];  Service: Vascular;  Laterality: Right;  . REVISON OF ARTERIOVENOUS FISTULA Right 07/15/2014   Procedure: EXPLORATION OF ARTERIOVENOUS FISTULA;  Surgeon: Elam Dutch, MD;  Location: [facility];  Service: Vascular;  Laterality: Right;  . TEE WITHOUT CARDIOVERSION N/A 12/09/2014   Procedure: TRANSESOPHAGEAL ECHOCARDIOGRAM (TEE);  Surgeon: Gaye Pollack, MD;  Location: [facility];  Service: Open Heart Surgery;  Laterality: N/A;    Social History:  reports that he has never smoked. He has never used smokeless tobacco. He reports current alcohol use of about 7.0 standard drinks of alcohol per week. He reports that he does not use drugs.  Family History:  Family History  Problem Relation Age of Onset  . Diabetes Father   . Lung cancer Father 87       non-smoker  . Cancer Mother        multiple myeloma  . Colon cancer Neg Hx      Prior to Admission medications   Medication Sig  Start Date End Date Taking? Authorizing Provider  allopurinol (ZYLOPRIM) 100 MG tablet TAKE 1 TABLET BY MOUTH DAILY 06/21/18   Reed, Tiffany L, DO  atorvastatin (LIPITOR) 20 MG tablet TAKE 1 TABLET BY MOUTH DAILY FOR CHOLESTEROL 06/21/18   Reed, Tiffany L, DO  calcium carbonate (TUMS - DOSED IN MG ELEMENTAL CALCIUM) 500 MG chewable tablet Chew 2 tablets by mouth daily as needed for indigestion.     [provider]  carvedilol (COREG) 3.125 MG tablet Take 1 tablet (3.125 mg total) by mouth 2 (two) times daily. 05/26/18 05/21/19  Burtis Junes, NP  cinacalcet (SENSIPAR) 60 MG tablet Take 60 mg by mouth. MondayWednesday, Friday, Sunday    [provider]  omeprazole (PRILOSEC) 20 MG capsule Take 20 mg by mouth daily.     [provider]    Physical Exam: Vitals:   10/31/18 2315 11/01/18 0055 11/01/18 0200 11/01/18 0300  BP: 118/61 118/61 (!) 108/49 (!) 114/55  Pulse:  81    Resp: (!) 24  18 17  Temp:      TempSrc:      SpO2:      Weight:      Height:       General: Not in acute distress HEENT:       Eyes: PERRL, EOMI, no scleral icterus.       ENT: No discharge from the ears and nose, no pharynx injection, no tonsillar enlargement.        Neck: No JVD, no bruit, no mass felt. Heme: No neck lymph node enlargement. Cardiac: G8/Z6, RRR, has systolic murmurs, No gallops or rubs. Respiratory:  No rales, wheezing, rhonchi or rubs. GI: Soft, nondistended, nontender, no rebound pain, no organomegaly, BS present. GU: No hematuria Ext: No pitting leg edema bilaterally. 2+DP/PT pulse bilaterally. Musculoskeletal: No joint deformities, No joint redness or warmth, no limitation of ROM in spin. Skin: No rashes.  Neuro: Alert, oriented X3, cranial nerves II-XII grossly intact, moves all extremities normally. Muscle strength 5/5 in all extremities, sensation to light touch intact. Psych: Patient is not psychotic, no suicidal or hemocidal ideation.  Labs on Admission: I  have personally reviewed following labs and imaging studies  CBC: Recent Labs  Lab 10/31/18 2126  WBC 19.8*  NEUTROABS 17.8*  HGB 12.9*  HCT 38.5*  MCV 101.6*  PLT 629   Basic Metabolic Panel: Recent Labs  Lab 10/31/18 2126 11/01/18 0110  NA 135 135  K 6.0* 4.4  CL 96* 95*  CO2 22 24  GLUCOSE 107* 285*  BUN 31* 33*  CREATININE 6.07* 6.30*  CALCIUM 9.5 8.8*   GFR: Estimated Creatinine Clearance: 8.4 mL/min (A) (by C-G formula based on SCr of 6.3 mg/dL (H)). Liver Function Tests: Recent Labs  Lab 10/31/18 2126  AST 115*  ALT 21  ALKPHOS 63  BILITOT 1.2  PROT 7.7  ALBUMIN 3.6   Recent Labs  Lab 10/31/18 2126  LIPASE 34   No results for input(s): AMMONIA in the last 168 hours. Coagulation Profile: No results for input(s): INR, PROTIME in the last 168 hours. Cardiac Enzymes: No results for input(s): CKTOTAL, CKMB, CKMBINDEX, TROPONINI in the last 168 hours. BNP (last 3 results) No results for input(s): PROBNP in the last 8760 hours. HbA1C: No results for input(s): HGBA1C in the last 72 hours. CBG: Recent Labs  Lab 11/01/18 0002 11/01/18 0120 11/01/18 0222  GLUCAP 207* 283* 233*   Lipid Profile: No results for input(s): CHOL, HDL, LDLCALC, TRIG, CHOLHDL, LDLDIRECT in the last 72 hours. Thyroid Function Tests: No results for input(s): TSH, T4TOTAL, FREET4, T3FREE, THYROIDAB in the last 72 hours. Anemia Panel: No results for input(s): VITAMINB12, FOLATE, FERRITIN, TIBC, IRON, RETICCTPCT in the last 72 hours. Urine analysis:    Component Value Date/Time   COLORURINE YELLOW 12/06/2014 0920   APPEARANCEUR CLEAR 12/06/2014 0920   LABSPEC 1.011 12/06/2014 0920   PHURINE 8.5 (H) 12/06/2014 0920   GLUCOSEU 100 (A) 12/06/2014 0920   HGBUR NEGATIVE 12/06/2014 0920   BILIRUBINUR NEGATIVE 12/06/2014 0920   KETONESUR NEGATIVE 12/06/2014 0920   PROTEINUR >300 (A) 12/06/2014 0920   NITRITE NEGATIVE 12/06/2014 0920   LEUKOCYTESUR NEGATIVE 12/06/2014 0920    Sepsis Labs: _0 (procalcitonin:4,lacticidven:4) )No results found for this or any previous visit (from the past 240 hour(s)).   Radiological Exams on Admission: Dg Chest 2 View  Result Date: 10/31/2018 CLINICAL DATA:  Fatigue following dialysis today EXAM: CHEST - 2 VIEW COMPARISON:  10/10/2017 FINDINGS: Cardiac shadow remains enlarged. Postsurgical changes are again seen. Aortic  calcifications are noted. The lungs are well aerated bilaterally. Patchy parenchymal opacity is noted in the right lower lobe consistent with acute pneumonia. No bony abnormality is seen. IMPRESSION: Changes consistent with acute right lower lobe pneumonia. Followup PA and lateral chest X-ray is recommended in 3-4 weeks following trial of antibiotic therapy to ensure resolution and exclude underlying malignancy. Electronically Signed   By: Inez Catalina M.D.   On: 10/31/2018 21:24     EKG: Independently reviewed.  Sinus rhythm, QTC 536, LAE, LAD, LVH, old left bundle blockage, poor R wave progression,  Assessment/Plan Principal Problem:   Syncope Active Problems:   Essential hypertension   GERD (gastroesophageal reflux disease)   Gout   ESRD on dialysis (HCC)   S/P AVR   Hypotension   Hyperkalemia   S/P CABG (coronary artery bypass graft)   Chronic systolic CHF (congestive heart failure) (HCC)   Lobar pneumonia (HCC)   Sepsis (HCC)   HLD (hyperlipidemia)   CAD (coronary artery disease)   Aspiration pneumonia (HCC)   Alcohol use   Abnormal LFTs   Syncope: Likely due to orthostatic status and hypotension 2/2 fluid removal from dialysis. Pt has sCHF with EF 20-25%. According to PCP note, 07/19/2018 decision was made that pacemaker was high infection risk as he is a dialysis patient.  Patient does not have any chest pain or palpitation.  No shortness of breath. No fall. No injury. No focal neurological findings on physical examination. Will hold off brain image now.  -will place on tele bed for obs  -Frequent neuro check -Gentle IV fluid: 500 cc normal saline, then 75 cc/h for 6 hours -start midodrine 10 mg tid for hypotension  Possible aspiration pneumonia versus lobar pneumonia: Patient does not have respiratory symptoms, but she has leukocytosis with WBC 19.8.  Chest x-ray showed right lower lobe patchy infiltration. - Unasyn per pharm (pt received one dose of IV Rocephin and azithromycin in ED) - Mucinex for cough  - prn Albuterol Nebs - Urine legionella and S. pneumococcal antigen - Follow up blood culture x2, sputum culture  Possible sepsis due to pneumonia: Patient admits critical for sepsis with leukocytosis and tachypnea (RR 28).  He also had hypotension, which is partially due to fluid removal from dialysis, but is also possible due to sepsis. Lactic acid 1.9 ->2.2.  Currently hemodynamically stable -will get Procalcitonin and trend lactic acid levels per sepsis protocol. -IVF: 500  mL of NS bolus in ED, followed by 75 cc/h for 6 hours -On unasyn as above -f/u bx  Addendum: lactic acid level trending up 1.9 --> 2.2 --> 4.5 -will give another 500 cc NS bolus -will check IV NS to 100 cc/h -trend lactic acid levels  Hypotension: Pt was hypotensive with Bp 71/47 which improved to 119/60 after given 500 cc of NS in ED. This is likely due to fluid removal from HD. Sepsis is also possible -IVF as above -start midodrine 10 mg tid  Essential hypertension: -hold coreg  GERD (gastroesophageal reflux disease): -protonix  Gout: -Allopurinol  ESRD on dialysis (TTS):  potassium 6.0, bicarbonate 22, creatinine 6.07, BUN 31, EDP consulted nephrology Dr. Royce Macadamia who recommends medical admission and patient will likely dialyze first thing tomorrow morning. -f/u renal recommendations  Hyperkalemia: K 6.0. no T wave peaking. -treated with Novology, D50 and 10 g of Lokelma  CAD: s/p of CABG (coronary artery bypass graft). No CP -continue lipitor  Chronic systolic CHF (congestive  heart failure) ([place]): 2D echo on 05/19/2018 showed  EF 20-25%.  No leg edema.  No shortness of breath.  CHF is compensated. -Volume management per renal by dialysis  HLD (hyperlipidemia): -Lipitor  Alcohol use: -CiWA  -Vitamin B1 and folic acid  Abnormal LFTs: ratio of AST/ALT (115/21) is typical for alcohol use. -f/u by CMP    DVT ppx: SQ Heparin   Code Status: Partial code (I discussed with the patient, and explained the meaning of CODE STATUS, patient wants to be partial code, OK for CPR, but no intubation). Family Communication: None at bed side.  Disposition Plan:  Anticipate discharge back to previous independent living facility Consults called:  none Admission status: Obs / tele    Date of Service 11/01/2018    [facility]   If 7PM-7AM, please contact night-coverage [email or web address] Password [email or web address] 11/01/2018, 3:18 AM

## 2018-10-31 NOTE — ED Triage Notes (Signed)
Pt presents to ED from [facility]. Pt c/o feeling "drained" since after dialysis this morning. Pt reports he was told he passed out during dialysis but has no memory of it. Per independent living facility RN pt hypotensive after dialysis which is normal for him. EMS vitals orthostatic.

## 2018-10-31 NOTE — ED Provider Notes (Signed)
[facility] EMERGENCY DEPARTMENT Provider Note   CSN: [id number] Arrival date & time: 10/31/18  1959     History   Chief Complaint Chief Complaint  Patient presents with   Fatigue    HPI Taha Dimond is a 85 y.o. male past medical history significant for ESRD on dialysis TTHS, systolic CHF, nonischemic cardiomyopathy with EF 20-25%, CABG presents to emergency department today via EMS from independent living facilty with chief complaint of fatigue.  Onset was acute, starting today.  Patient states he was feeling like his normal self this morning before dialysis.  Patient reports immediately after finishing dialysis he passed out while sitting in the chair but does not remember what happened exactly.  He states this has happened in the past.  He completed his normal dialysis session.  He went home and throughout the day continued to be feeling generalized fatigue and weakness.  He admits to one episode of nausea and nonbloody nonbilious emesis today.  Per independent living facility pt's vitals were checked and he was found to be hypotensive and sent to ED for further evaluation. EMS reports orthostatic vitals. He denies fever, chills, chest pain, shortness of breath, abdominal pain, urinary symptoms, diarrhea.  Patient denies any sick contacts.  Denies any contact with anyone positive for COVID-19.   Chart review shows patient has issues with hypotension, he was seen by cardiology Dr. Ree Shay on 6/29/ 2020.  The plan was to hold his Coreg on dialysis days to help with fatigue and hypotension.  Patient reports he he has been compliant with this and did not take it today as he had dialysis.  According to PCP note, 07/19/2018 decision was made that pacemaker was high infection risk as he is a dialysis patient.  Past Medical History:  Diagnosis Date   Anxiety    Arthritis    Complication of anesthesia    " one time my heart stopped due to a medication that I was  on."    ESRD (end stage renal disease) on dialysis ([place])    Tues, Thursday, Saturday; [facility]; [facility] (10/10/2017)   GERD (gastroesophageal reflux disease)    Gout    Heart murmur    as a teen   Herpes zoster 04/2012   Hypertension    Shortness of breath dyspnea    with exertion    Patient Active Problem List   Diagnosis Date Noted   Chronic systolic CHF (congestive heart failure) ([place]) 10/31/2018   Lobar pneumonia ([place]) 10/31/2018   Sepsis ([place]) 10/31/2018   HLD (hyperlipidemia) 10/31/2018   Syncope 10/31/2018   CAD (coronary artery disease) 67/20/9470   Systolic CHF with reduced left ventricular function, NYHA class 3 ([place]) 07/21/2018   Nonischemic cardiomyopathy ([place]) 05/17/2018   Hyponatremia 10/11/2017   Abnormal EKG    Hypotension 10/10/2017   Hyperkalemia 10/10/2017   S/P CABG (coronary artery bypass graft) 10/10/2017   Secondary hyperparathyroidism of renal origin ([place]) 05/11/2017   A-V fistula ([place]) 11/10/2016   Normocytic anemia 04/16/2016   Heme positive stool 04/16/2016   S/P AVR 12/09/2014   Coronary artery disease involving native coronary artery of native heart with unstable angina pectoris (HCC)    Insomnia, unspecified 09/24/2013   Pain in neck 09/24/2013   Cough 06/04/2013   Gout 06/04/2013   ESRD on dialysis ([place]) 06/04/2013   Essential hypertension    GERD (gastroesophageal reflux disease)    Herpes zoster 06/04/2008    Past Surgical History:  Procedure Laterality Date   AORTIC VALVE REPLACEMENT N/A 12/09/2014   Procedure: AORTIC VALVE REPLACEMENT (AVR) WITH 23MM MAGNA EASE BIOPROSTHETIC VALVE;  Surgeon: Gaye Pollack, MD;  Location: [facility];  Service: Open Heart Surgery;  Laterality: N/A;   APPENDECTOMY  1944   AV FISTULA PLACEMENT Left    Left Cim[place] AVF placed in [place] Right 03/06/2014   Procedure: ARTERIOVENOUS (AV) FISTULA CREATION-right radiocephalic;  Surgeon:  Mal Misty, MD;  Location: [facility];  Service: Vascular;  Laterality: Right;   AV FISTULA PLACEMENT Right 07/15/2014   Procedure: ARTERIOVENOUS (AV) FISTULA CREATION;  Surgeon: Elam Dutch, MD;  Location: [facility];  Service: Vascular;  Laterality: Right;   CARDIAC CATHETERIZATION N/A 11/27/2014   Procedure: Right/Left Heart Cath and Coronary Angiography;  Surgeon: Burnell Blanks, MD;  Location: [facility];  Service: Cardiovascular;  Laterality: N/A;   CATARACT EXTRACTION W/ INTRAOCULAR LENS IMPLANT Bilateral 2014   [facility]   COLONOSCOPY  2013   Dr. Annamaria Helling [place], [place].   CORONARY ARTERY BYPASS GRAFT N/A 12/09/2014   Procedure: CORONARY ARTERY BYPASS GRAFTING (CABG), ON PUMP, TIMES THREE, USING LEFT INTERNAL MAMMARY ARTERY, RIGHT GREATER SAPHENOUS VEIN HARVESTED ENDOSCOPICALLY;  Surgeon: Gaye Pollack, MD;  Location: [facility];  Service: Open Heart Surgery;  Laterality: N/A;  LIMA-LAD; SVG-OM; SVG-PD   DIALYSIS FISTULA CREATION  08/04/2012 and 10/13   Dr. Harden Mo   INSERTION OF DIALYSIS CATHETER Right 01-15-14   Right chest TDC placed by Dr. Augustin Coupe at [facility]   LIGATION OF ARTERIOVENOUS  FISTULA Right 07/15/2014   Procedure: LIGATION OF ARTERIOVENOUS  FISTULA  (RIGHT RADIOCEPHALIC);  Surgeon: Elam Dutch, MD;  Location: [facility];  Service: Vascular;  Laterality: Right;   LIGATION OF COMPETING BRANCHES OF ARTERIOVENOUS FISTULA Right 05/08/2014   Procedure: LIGATION OF COMPETING BRANCHES OF RIGHT ARM RADIOCEPHALIC ARTERIOVENOUS FISTULA;  Surgeon: Mal Misty, MD;  Location: [facility];  Service: Vascular;  Laterality: Right;   REVISON OF ARTERIOVENOUS FISTULA Right 07/15/2014   Procedure: EXPLORATION OF ARTERIOVENOUS FISTULA;  Surgeon: Elam Dutch, MD;  Location: [facility];  Service: Vascular;  Laterality: Right;   TEE WITHOUT CARDIOVERSION N/A 12/09/2014   Procedure: TRANSESOPHAGEAL ECHOCARDIOGRAM (TEE);  Surgeon: Gaye Pollack, MD;  Location: [facility];   Service: Open Heart Surgery;  Laterality: N/A;        Home Medications    Prior to Admission medications   Medication Sig Start Date End Date Taking? Authorizing Provider  allopurinol (ZYLOPRIM) 100 MG tablet TAKE 1 TABLET BY MOUTH DAILY 06/21/18   Reed, Tiffany L, DO  atorvastatin (LIPITOR) 20 MG tablet TAKE 1 TABLET BY MOUTH DAILY FOR CHOLESTEROL 06/21/18   Reed, Tiffany L, DO  calcium carbonate (TUMS - DOSED IN MG ELEMENTAL CALCIUM) 500 MG chewable tablet Chew 2 tablets by mouth daily as needed for indigestion.     [provider]  carvedilol (COREG) 3.125 MG tablet Take 1 tablet (3.125 mg total) by mouth 2 (two) times daily. 05/26/18 05/21/19  Burtis Junes, NP  cinacalcet (SENSIPAR) 60 MG tablet Take 60 mg by mouth. MondayWednesday, Friday, Sunday    [provider]  omeprazole (PRILOSEC) 20 MG capsule Take 20 mg by mouth daily.     [provider]    Family History Family History  Problem Relation Age of Onset   Diabetes Father    Lung cancer Father 61  non-smoker   Cancer Mother        multiple myeloma   Colon cancer Neg Hx     Social History Social History   Tobacco Use   Smoking status: Never Smoker   Smokeless tobacco: Never Used  Substance Use Topics   Alcohol use: Yes    Alcohol/week: 7.0 standard drinks    Types: 3 Glasses of wine, 4 Shots of liquor per week    Comment: 10/10/2017 "one glass a day of either wine or liquor"   Drug use: No     Allergies   Patient has no known allergies.   Review of Systems Review of Systems  Constitutional: Positive for fatigue. Negative for chills and fever.  HENT: Negative for congestion, rhinorrhea, sinus pressure and sore throat.   Eyes: Negative for pain and redness.  Respiratory: Negative for cough, shortness of breath and wheezing.   Cardiovascular: Negative for chest pain and palpitations.  Gastrointestinal: Negative for abdominal pain, constipation, diarrhea, nausea and  vomiting.  Genitourinary: Negative for dysuria.  Musculoskeletal: Negative for arthralgias, back pain, myalgias and neck pain.  Skin: Negative for rash and wound.  Neurological: Positive for weakness. Negative for dizziness, syncope, numbness and headaches.  Psychiatric/Behavioral: Negative for confusion.     Physical Exam Updated Vital Signs BP (!) 71/47    Temp 98.4 F (36.9 C) (Oral)    Resp (!) 24    Ht 5' 9"  (1.753 m)    Wt 74.8 kg    BMI 24.37 kg/m   Physical Exam Vitals signs and nursing note reviewed.  Constitutional:      General: He is not in acute distress.    Appearance: He is not ill-appearing.  HENT:     Head: Normocephalic and atraumatic.     Right Ear: Tympanic membrane and external ear normal.     Left Ear: Tympanic membrane and external ear normal.     Nose: Nose normal.     Mouth/Throat:     Mouth: Mucous membranes are moist.     Pharynx: Oropharynx is clear.  Eyes:     General: No scleral icterus.       Right eye: No discharge.        Left eye: No discharge.     Extraocular Movements: Extraocular movements intact.     Conjunctiva/sclera: Conjunctivae normal.     Pupils: Pupils are equal, round, and reactive to light.  Neck:     Musculoskeletal: Normal range of motion.     Vascular: No JVD.  Cardiovascular:     Rate and Rhythm: Normal rate and regular rhythm.     Pulses: Normal pulses.          Radial pulses are 2+ on the right side and 2+ on the left side.     Heart sounds: Normal heart sounds.  Pulmonary:     Comments: Lung sounds diminished throughout. Symmetric chest rise. No wheezing, rales, or rhonchi noted. No respiratory distress. Abdominal:     Comments: Abdomen is soft, non-distended, and non-tender in all quadrants. No rigidity, no guarding. No peritoneal signs.  Musculoskeletal: Normal range of motion.     Right lower leg: No edema.     Left lower leg: No edema.  Skin:    General: Skin is warm and dry.     Capillary Refill: Capillary  refill takes less than 2 seconds.  Neurological:     Mental Status: He is oriented to person, place, and time.  GCS: GCS eye subscore is 4. GCS verbal subscore is 5. GCS motor subscore is 6.     Comments: Fluent speech, no facial droop.  Psychiatric:        Behavior: Behavior normal.      ED Treatments / Results  Labs (all labs ordered are listed, but only abnormal results are displayed) Labs Reviewed  CBC WITH DIFFERENTIAL/PLATELET - Abnormal; Notable for the following components:      Result Value   WBC 19.8 (*)    RBC 3.79 (*)    Hemoglobin 12.9 (*)    HCT 38.5 (*)    MCV 101.6 (*)    Neutro Abs 17.8 (*)    Lymphs Abs 0.5 (*)    Monocytes Absolute 1.3 (*)    Abs Immature Granulocytes 0.18 (*)    All other components within normal limits  COMPREHENSIVE METABOLIC PANEL - Abnormal; Notable for the following components:   Potassium 6.0 (*)    Chloride 96 (*)    Glucose, Bld 107 (*)    BUN 31 (*)    Creatinine, Ser 6.07 (*)    AST 115 (*)    GFR calc non Af Amer 8 (*)    GFR calc Af Amer 9 (*)    Anion gap 17 (*)    All other components within normal limits  CULTURE, BLOOD (ROUTINE X 2)  CULTURE, BLOOD (ROUTINE X 2)  SARS CORONAVIRUS 2 (TAT 6-24 HRS)  URINE CULTURE  RESPIRATORY PANEL BY PCR  LIPASE, BLOOD  LACTIC ACID, PLASMA  URINALYSIS, ROUTINE W REFLEX MICROSCOPIC  LACTIC ACID, PLASMA  LEGIONELLA PNEUMOPHILA SEROGP 1 UR AG  STREP PNEUMONIAE URINARY ANTIGEN  INFLUENZA PANEL BY PCR (TYPE A & B)  PROCALCITONIN  CBG MONITORING, ED    EKG EKG Interpretation  Date/Time:  Tuesday October 31 2018 20:11:57 EDT Ventricular Rate:  85 PR Interval:    QRS Duration: 173 QT Interval:  450 QTC Calculation: 536 R Axis:   -100 Text Interpretation: Sinus rhythm Probable left atrial enlargement Nonspecific IVCD with LAD Consider left ventricular hypertrophy Abnormal T, consider ischemia, lateral leads When compard to prior, sharper t waves. no STEMI Confirmed by  Antony Blackbird [phone]) on 10/31/2018 8:13:07 PM   Radiology Dg Chest 2 View  Result Date: 10/31/2018 CLINICAL DATA:  Fatigue following dialysis today EXAM: CHEST - 2 VIEW COMPARISON:  10/10/2017 FINDINGS: Cardiac shadow remains enlarged. Postsurgical changes are again seen. Aortic calcifications are noted. The lungs are well aerated bilaterally. Patchy parenchymal opacity is noted in the right lower lobe consistent with acute pneumonia. No bony abnormality is seen. IMPRESSION: Changes consistent with acute right lower lobe pneumonia. Followup PA and lateral chest X-ray is recommended in 3-4 weeks following trial of antibiotic therapy to ensure resolution and exclude underlying malignancy. Electronically Signed   By: Inez Catalina M.D.   On: 10/31/2018 21:24    Procedures .Critical Care Performed by: Cherre Robins, PA-C Authorized by: Cherre Robins, PA-C   Critical care provider statement:    Critical care time (minutes):  45   Critical care time was exclusive of:  Separately billable procedures and treating other patients and teaching time   Critical care was necessary to treat or prevent imminent or life-threatening deterioration of the following conditions:  Sepsis and metabolic crisis   Critical care was time spent personally by me on the following activities:  Development of treatment plan with patient or surrogate, discussions with consultants, evaluation of patient's response to treatment, obtaining  history from patient or surrogate, ordering and performing treatments and interventions, ordering and review of laboratory studies, ordering and review of radiographic studies, re-evaluation of patient's condition and review of old charts   I assumed direction of critical care for this patient from another provider in my specialty: no     (including critical care time)  Medications Ordered in ED Medications  cefTRIAXone (ROCEPHIN) 2 g in sodium chloride 0.9 % 100 mL IVPB (0 g  Intravenous Stopped 10/31/18 2301)  azithromycin (ZITHROMAX) 500 mg in sodium chloride 0.9 % 250 mL IVPB (0 mg Intravenous Stopped 10/31/18 2332)  midodrine (PROAMATINE) tablet 10 mg (has no administration in time range)  0.9 %  sodium chloride infusion (has no administration in time range)  heparin injection 5,000 Units (has no administration in time range)  dextromethorphan-guaiFENesin (MUCINEX DM) 30-600 MG per 12 hr tablet 1 tablet (has no administration in time range)  albuterol (VENTOLIN HFA) 108 (90 Base) MCG/ACT inhaler 2 puff (has no administration in time range)  acetaminophen (TYLENOL) tablet 650 mg (has no administration in time range)  dextrose 50 % solution 50 mL (has no administration in time range)  sodium chloride 0.9 % bolus 500 mL (0 mLs Intravenous Stopped 10/31/18 2222)  albuterol (VENTOLIN HFA) 108 (90 Base) MCG/ACT inhaler 1-2 puff (2 puffs Inhalation Given 10/31/18 2333)  sodium zirconium cyclosilicate (LOKELMA) packet 10 g (10 g Oral Given 10/31/18 2335)  insulin aspart (novoLOG) injection 10 Units (10 Units Intravenous Given 10/31/18 2335)    And  dextrose 50 % solution 50 mL (50 mLs Intravenous Given 10/31/18 2335)     Initial Impression / Assessment and Plan / ED Course  I have reviewed the triage vital signs and the nursing notes.  Pertinent labs & imaging results that were available during my care of the patient were reviewed by me and considered in my medical decision making (see chart for details).  Patient seen and examined. Patient looks to not feel well, but is nontoxic appearing, in no apparent distress.  His initial blood pressure on ED arrival was 128/118.  It was rechecked several minutes later and he was found to be hypotensive at 71/47.  No tachycardia or hypoxia. Small fluid bolus given pt's cardiac history but with significant hypotension 539m ordered.  CMP shows hyperkalemia of 6.0, creatinine is 6.07 consistent with patient's baseline. EKG with  abnormal T waves, consistent with EKG changes given his hyperkalemia. As Covid test has not yet resulted will order albuterol inhaler, insulin and d50.CBC significant for leukocytosis of 19.8.  Code sepsis initiated and IV antibiotic started for pneumonia. Chest xray viewed by myself and ED attending shows pneumonia in right lower lobe. Lipase the normal range.UA ordered, not yet collected as patient makes minimal urine. He denies urinary symptoms, doubt UTI as source of infection.  On reassessment pt's blood pressure improved and most recent pressure is 115/65 with heart rate in the 80s. The patient was discussed with and seen by Dr. TSherry Ruffingwho agrees with the treatment plan.   Consulted nephrology Dr. FRoyce Macadamiawho recommends medical admission and patient will likely dialyze first thing tomorrow morning. Pt will require close glucose monitoring with hourly checks to make sure he does not become hypoglycemic after receiving insulin. Second amp of d50 given. RN notified and is aware to closely monitor. Spoke with Dr. NBlaine Hamperwith hospitalist service who agrees to assume care of patient and bring into the hospital for further evaluation and management.     Portions  of this note were generated with [employer]obby[employer]. Dictation errors may occur despite best attempts at proofreading.  Vitals:   10/31/18 2145 10/31/18 2200 10/31/18 2215 10/31/18 2230  BP: 100/60 123/68 119/60 115/65  Pulse: 81     Resp: (!) 22 18 (!) 24 20  Temp:      TempSrc:      SpO2: 95%     Weight:      Height:         Final Clinical Impressions(s) / ED Diagnoses   Final diagnoses:  Hyperkalemia  Community acquired pneumonia of right lower lobe of lung    ED Discharge Orders    None       Flint Melter 11/01/18 0105    Tegeler, Gwenyth Allegra, MD 11/01/18 1215

## 2018-10-31 NOTE — ED Notes (Signed)
Patient transported to X-ray 

## 2018-10-31 NOTE — ED Notes (Signed)
IV attempted x2 by RN. Unsuccessful

## 2018-10-31 NOTE — ED Notes (Signed)
Pt aware that urine sample is needed. Pt reports he only produces a few drops.

## 2018-11-01 ENCOUNTER — Other Ambulatory Visit: Payer: Self-pay

## 2018-11-01 ENCOUNTER — Encounter (HOSPITAL_COMMUNITY): Payer: Self-pay | Admitting: Internal Medicine

## 2018-11-01 DIAGNOSIS — Z952 Presence of prosthetic heart valve: Secondary | ICD-10-CM | POA: Diagnosis not present

## 2018-11-01 DIAGNOSIS — I428 Other cardiomyopathies: Secondary | ICD-10-CM | POA: Diagnosis present

## 2018-11-01 DIAGNOSIS — D631 Anemia in chronic kidney disease: Secondary | ICD-10-CM | POA: Diagnosis present

## 2018-11-01 DIAGNOSIS — Z951 Presence of aortocoronary bypass graft: Secondary | ICD-10-CM | POA: Diagnosis not present

## 2018-11-01 DIAGNOSIS — I2510 Atherosclerotic heart disease of native coronary artery without angina pectoris: Secondary | ICD-10-CM | POA: Diagnosis present

## 2018-11-01 DIAGNOSIS — I959 Hypotension, unspecified: Secondary | ICD-10-CM | POA: Diagnosis not present

## 2018-11-01 DIAGNOSIS — M109 Gout, unspecified: Secondary | ICD-10-CM | POA: Diagnosis present

## 2018-11-01 DIAGNOSIS — E785 Hyperlipidemia, unspecified: Secondary | ICD-10-CM | POA: Diagnosis present

## 2018-11-01 DIAGNOSIS — R945 Abnormal results of liver function studies: Secondary | ICD-10-CM | POA: Diagnosis present

## 2018-11-01 DIAGNOSIS — R55 Syncope and collapse: Secondary | ICD-10-CM | POA: Diagnosis not present

## 2018-11-01 DIAGNOSIS — J9811 Atelectasis: Secondary | ICD-10-CM | POA: Diagnosis not present

## 2018-11-01 DIAGNOSIS — N186 End stage renal disease: Secondary | ICD-10-CM | POA: Diagnosis present

## 2018-11-01 DIAGNOSIS — J189 Pneumonia, unspecified organism: Secondary | ICD-10-CM | POA: Diagnosis present

## 2018-11-01 DIAGNOSIS — Z961 Presence of intraocular lens: Secondary | ICD-10-CM | POA: Diagnosis present

## 2018-11-01 DIAGNOSIS — Z9841 Cataract extraction status, right eye: Secondary | ICD-10-CM | POA: Diagnosis not present

## 2018-11-01 DIAGNOSIS — I953 Hypotension of hemodialysis: Secondary | ICD-10-CM | POA: Diagnosis present

## 2018-11-01 DIAGNOSIS — Z7289 Other problems related to lifestyle: Secondary | ICD-10-CM

## 2018-11-01 DIAGNOSIS — I5022 Chronic systolic (congestive) heart failure: Secondary | ICD-10-CM | POA: Diagnosis present

## 2018-11-01 DIAGNOSIS — N2581 Secondary hyperparathyroidism of renal origin: Secondary | ICD-10-CM | POA: Diagnosis present

## 2018-11-01 DIAGNOSIS — K219 Gastro-esophageal reflux disease without esophagitis: Secondary | ICD-10-CM | POA: Diagnosis present

## 2018-11-01 DIAGNOSIS — I132 Hypertensive heart and chronic kidney disease with heart failure and with stage 5 chronic kidney disease, or end stage renal disease: Secondary | ICD-10-CM | POA: Diagnosis present

## 2018-11-01 DIAGNOSIS — Z20828 Contact with and (suspected) exposure to other viral communicable diseases: Secondary | ICD-10-CM | POA: Diagnosis present

## 2018-11-01 DIAGNOSIS — Z789 Other specified health status: Secondary | ICD-10-CM

## 2018-11-01 DIAGNOSIS — I447 Left bundle-branch block, unspecified: Secondary | ICD-10-CM | POA: Diagnosis present

## 2018-11-01 DIAGNOSIS — R7989 Other specified abnormal findings of blood chemistry: Secondary | ICD-10-CM | POA: Diagnosis present

## 2018-11-01 DIAGNOSIS — Z8619 Personal history of other infectious and parasitic diseases: Secondary | ICD-10-CM | POA: Diagnosis not present

## 2018-11-01 DIAGNOSIS — D62 Acute posthemorrhagic anemia: Secondary | ICD-10-CM | POA: Diagnosis not present

## 2018-11-01 DIAGNOSIS — E875 Hyperkalemia: Secondary | ICD-10-CM | POA: Diagnosis present

## 2018-11-01 DIAGNOSIS — F1090 Alcohol use, unspecified, uncomplicated: Secondary | ICD-10-CM

## 2018-11-01 DIAGNOSIS — Z9842 Cataract extraction status, left eye: Secondary | ICD-10-CM | POA: Diagnosis not present

## 2018-11-01 DIAGNOSIS — A419 Sepsis, unspecified organism: Secondary | ICD-10-CM | POA: Diagnosis present

## 2018-11-01 DIAGNOSIS — J690 Pneumonitis due to inhalation of food and vomit: Secondary | ICD-10-CM | POA: Diagnosis present

## 2018-11-01 DIAGNOSIS — Z992 Dependence on renal dialysis: Secondary | ICD-10-CM | POA: Diagnosis not present

## 2018-11-01 LAB — LACTIC ACID, PLASMA
Lactic Acid, Venous: 1.4 mmol/L (ref 0.5–1.9)
Lactic Acid, Venous: 1.7 mmol/L (ref 0.5–1.9)
Lactic Acid, Venous: 2.2 mmol/L (ref 0.5–1.9)
Lactic Acid, Venous: 2.2 mmol/L (ref 0.5–1.9)
Lactic Acid, Venous: 2.8 mmol/L (ref 0.5–1.9)
Lactic Acid, Venous: 4.5 mmol/L (ref 0.5–1.9)

## 2018-11-01 LAB — URINALYSIS, ROUTINE W REFLEX MICROSCOPIC
Bilirubin Urine: NEGATIVE
Glucose, UA: 150 mg/dL — AB
Ketones, ur: NEGATIVE mg/dL
Leukocytes,Ua: NEGATIVE
Nitrite: NEGATIVE
Protein, ur: 100 mg/dL — AB
Specific Gravity, Urine: 1.014 (ref 1.005–1.030)
pH: 9 — ABNORMAL HIGH (ref 5.0–8.0)

## 2018-11-01 LAB — CBG MONITORING, ED
Glucose-Capillary: 103 mg/dL — ABNORMAL HIGH (ref 70–99)
Glucose-Capillary: 152 mg/dL — ABNORMAL HIGH (ref 70–99)
Glucose-Capillary: 207 mg/dL — ABNORMAL HIGH (ref 70–99)
Glucose-Capillary: 233 mg/dL — ABNORMAL HIGH (ref 70–99)
Glucose-Capillary: 283 mg/dL — ABNORMAL HIGH (ref 70–99)
Glucose-Capillary: 69 mg/dL — ABNORMAL LOW (ref 70–99)
Glucose-Capillary: 95 mg/dL (ref 70–99)

## 2018-11-01 LAB — BASIC METABOLIC PANEL
Anion gap: 16 — ABNORMAL HIGH (ref 5–15)
BUN: 33 mg/dL — ABNORMAL HIGH (ref 8–23)
CO2: 24 mmol/L (ref 22–32)
Calcium: 8.8 mg/dL — ABNORMAL LOW (ref 8.9–10.3)
Chloride: 95 mmol/L — ABNORMAL LOW (ref 98–111)
Creatinine, Ser: 6.3 mg/dL — ABNORMAL HIGH (ref 0.61–1.24)
GFR calc Af Amer: 8 mL/min — ABNORMAL LOW (ref >60)
GFR calc non Af Amer: 7 mL/min — ABNORMAL LOW (ref >60)
Glucose, Bld: 285 mg/dL — ABNORMAL HIGH (ref 70–99)
Potassium: 4.4 mmol/L (ref 3.5–5.1)
Sodium: 135 mmol/L (ref 135–145)

## 2018-11-01 LAB — STREP PNEUMONIAE URINARY ANTIGEN: Strep Pneumo Urinary Antigen: NEGATIVE

## 2018-11-01 LAB — PROCALCITONIN: Procalcitonin: 4.92 ng/mL

## 2018-11-01 LAB — SARS CORONAVIRUS 2 (TAT 6-24 HRS): SARS Coronavirus 2: NEGATIVE

## 2018-11-01 MED ORDER — LORAZEPAM 2 MG/ML IJ SOLN: 0.0000 mg | Freq: Two times a day (BID) | INTRAMUSCULAR | Status: DC

## 2018-11-01 MED ORDER — CHLORHEXIDINE GLUCONATE CLOTH 2 % EX PADS: 6.0000 | MEDICATED_PAD | Freq: Every day | CUTANEOUS | Status: DC

## 2018-11-01 MED ORDER — CINACALCET HCL 30 MG PO TABS
60.0000 mg | ORAL_TABLET | ORAL | Status: DC
  Administered 2018-11-01 – 2018-11-03 (×2): 60 mg via ORAL
  Filled 2018-11-01 (×2): qty 2

## 2018-11-01 MED ORDER — LORAZEPAM 2 MG/ML IJ SOLN: 0.0000 mg | Freq: Four times a day (QID) | INTRAMUSCULAR | Status: DC

## 2018-11-01 MED ORDER — PANTOPRAZOLE SODIUM 40 MG PO TBEC
40.0000 mg | DELAYED_RELEASE_TABLET | Freq: Every day | ORAL | Status: DC
  Administered 2018-11-01 – 2018-11-04 (×4): 40 mg via ORAL
  Filled 2018-11-01 (×4): qty 1

## 2018-11-01 MED ORDER — SODIUM CHLORIDE 0.9 % IV SOLN
INTRAVENOUS | Status: AC
  Administered 2018-11-01 (×2): via INTRAVENOUS

## 2018-11-01 MED ORDER — VITAMIN B-1 100 MG PO TABS
100.0000 mg | ORAL_TABLET | Freq: Every day | ORAL | Status: DC
  Administered 2018-11-01 – 2018-11-04 (×4): 100 mg via ORAL
  Filled 2018-11-01 (×4): qty 1

## 2018-11-01 MED ORDER — FOLIC ACID 1 MG PO TABS
1.0000 mg | ORAL_TABLET | Freq: Every day | ORAL | Status: DC
  Administered 2018-11-01 – 2018-11-04 (×4): 1 mg via ORAL
  Filled 2018-11-01 (×4): qty 1

## 2018-11-01 MED ORDER — ALLOPURINOL 100 MG PO TABS
100.0000 mg | ORAL_TABLET | Freq: Every day | ORAL | Status: DC
  Administered 2018-11-01 – 2018-11-04 (×4): 100 mg via ORAL
  Filled 2018-11-01 (×5): qty 1

## 2018-11-01 MED ORDER — SODIUM CHLORIDE 0.9 % IV SOLN
1.5000 g | Freq: Two times a day (BID) | INTRAVENOUS | Status: DC
  Administered 2018-11-01 – 2018-11-03 (×6): 1.5 g via INTRAVENOUS
  Filled 2018-11-01 (×9): qty 4

## 2018-11-01 MED ORDER — ADULT MULTIVITAMIN W/MINERALS CH
1.0000 | ORAL_TABLET | Freq: Every day | ORAL | Status: DC
  Administered 2018-11-01 – 2018-11-04 (×4): 1 via ORAL
  Filled 2018-11-01 (×4): qty 1

## 2018-11-01 MED ORDER — LORAZEPAM 1 MG PO TABS
1.0000 mg | ORAL_TABLET | ORAL | Status: AC | PRN
  Administered 2018-11-01: 1 mg via ORAL
  Administered 2018-11-03: 2 mg via ORAL
  Filled 2018-11-01: qty 2
  Filled 2018-11-01: qty 1

## 2018-11-01 MED ORDER — ATORVASTATIN CALCIUM 10 MG PO TABS
20.0000 mg | ORAL_TABLET | Freq: Every day | ORAL | Status: DC
  Administered 2018-11-01 – 2018-11-04 (×4): 20 mg via ORAL
  Filled 2018-11-01 (×4): qty 2

## 2018-11-01 MED ORDER — LORAZEPAM 2 MG/ML IJ SOLN: 1.0000 mg | INTRAMUSCULAR | Status: AC | PRN

## 2018-11-01 MED ORDER — SODIUM CHLORIDE 0.9 % IV BOLUS
500.0000 mL | Freq: Once | INTRAVENOUS | Status: AC
  Administered 2018-11-01: 500 mL via INTRAVENOUS

## 2018-11-01 MED ORDER — LORAZEPAM 2 MG/ML IJ SOLN: 0.0000 mg | Freq: Four times a day (QID) | INTRAMUSCULAR | Status: AC

## 2018-11-01 MED ORDER — THIAMINE HCL 100 MG/ML IJ SOLN: 100.0000 mg | Freq: Every day | INTRAMUSCULAR | Status: DC

## 2018-11-01 MED ORDER — ALBUTEROL SULFATE (2.5 MG/3ML) 0.083% IN NEBU: 2.5000 mg | INHALATION_SOLUTION | RESPIRATORY_TRACT | Status: DC | PRN

## 2018-11-01 MED ORDER — CALCIUM CARBONATE ANTACID 500 MG PO CHEW
2.0000 | CHEWABLE_TABLET | Freq: Three times a day (TID) | ORAL | Status: DC
  Administered 2018-11-01 – 2018-11-04 (×8): 400 mg via ORAL
  Filled 2018-11-01 (×8): qty 2

## 2018-11-01 MED ORDER — ATORVASTATIN CALCIUM 10 MG PO TABS: 20.0000 mg | ORAL_TABLET | ORAL | Status: DC

## 2018-11-01 NOTE — Progress Notes (Signed)
Notified bedside nurse of need to draw repeat lactic acid to see if trending down.

## 2018-11-01 NOTE — ED Notes (Signed)
CBG 69 pt given apple juice

## 2018-11-01 NOTE — Progress Notes (Signed)
NEW ADMISSION NOTE New Admission Note:   Arrival Method: Stretcher Mental Orientation: A&O X4  Telemetry: [id number] Assessment: Completed Skin: WDL IV: WDL Pain: Denies Safety Measures: Safety Fall Prevention Plan has been given, discussed and signed Admission: Completed [facility] Orientation: Patient has been orientated to the room, unit and staff.   Orders have been reviewed and implemented. Will continue to monitor the patient. Call light has been placed within reach and bed alarm has been activated.   Aneta Mins BSN, RN3

## 2018-11-01 NOTE — Progress Notes (Signed)
Pharmacy Antibiotic Note  Eric [name] is a 85 y.o. male admitted on 10/31/2018 with pneumonia.  Pharmacy has been consulted for Unasyn dosing. WBC elevated. ESRD on HD.   Plan: Unasyn 1.5g IV q12h Trend WBC, temp, HD schedule  Height: 5\' 9"  (175.3 cm) Weight: 165 lb (74.8 kg) IBW/kg (Calculated) : 70.7  Temp (24hrs), Avg:98.4 F (36.9 C), Min:98.4 F (36.9 C), Max:98.4 F (36.9 C)  Recent Labs  Lab 10/31/18 2126 10/31/18 2145  WBC 19.8*  --   CREATININE 6.07*  --   LATICACIDVEN  --  1.9    Estimated Creatinine Clearance: 8.7 mL/min (A) (by C-G formula based on SCr of 6.07 mg/dL (H)).    No Known Allergies  Narda Bonds, PharmD, BCPS Clinical Pharmacist Phone: [phone]

## 2018-11-01 NOTE — Progress Notes (Signed)
[facility] PROGRESS NOTE  Eric [name] VPX:[id number] DOB: 1930/08/19 DOA: 10/31/2018 PCP: Gayland Curry, DO  Assessment/Plan:  Syncope: Likely due to orthostatic status and hypotension 2/2 fluid removal from dialysis in setting of infectious process.  Pt has sCHF with EF 20-25%. According to PCP note, 07/19/2018 decision was made that pacemaker was high infection risk as he is a dialysis patient. provided with IV fluids. Reports feeling better this am. BP improved but at low end of normal. Denies sob/cp/cough. Flow sheet is afebrile but he feels quite warm.  -Gentle IV fluid: 500 cc normal saline, then 100 cc/h for 6 hours -start midodrine 10 mg tid for hypotension -monitor closely  Possible aspiration pneumonia versus lobar pneumonia: Patient does not have respiratory symptoms, but has leukocytosis with WBC 19.8 and chest x-ray showed right lower lobe patchy infiltration. Lactic acid trending down now and procalcitonin 4.9 - follow legionella and S. pneumococcal antigen - Follow up blood culture x2, sputum culture -continue IV antibiotics -nebs prn -anti-tussive  Possible sepsis due to pneumonia: Presents with  leukocytosis and tachypnea (RR 28).  He also had hypotension, which is partially due to fluid removal from dialysis, but is also possible due to sepsis. Lactic acid 1.9 ->2.2.  Currently hemodynamically stable. Lactic acid up to 4.5 but trending down now. procalcitonin 4.9.Marland Kitchen -IVF: 500  mL of NS bolus in ED and again at admission for trending up lactic acid, followed by 100 cc/h for 6 hours -On unasyn as above -f/u bx   Hypotension: Pt was hypotensive with Bp 71/47 which improved to 119/60 after given 500 cc of NS in ED. This is likely due to fluid removal from HD. Sepsis is also possible. Remains low end of normal -IVF as above -start midodrine 10 mg tid  Essential hypertension: -hold coreg  GERD (gastroesophageal reflux  disease): -protonix  Gout: -Allopurinol  ESRD on dialysis (TTS):  potassium 6.0, bicarbonate 22, creatinine 6.07, BUN 31, EDP consulted nephrology Dr. Lisbeth Ply recommendsmedicaladmission and patient will likely dialyze first thing tomorrow morning. -await dialysis recs -f/u renal recommendations  Hyperkalemia: K 6.0. no T wave peaking.Treated with Novology, D50 and 10 g of Lokelma. Level down to 4.4 this am -monitor -dialysis   CAD: s/p of CABG (coronary artery bypass graft). No CP -continue lipitor  Chronic systolic CHF (congestive heart failure) ([place]): 2D echo on 05/19/2018 showed EF 20-25%.  No leg edema.  No shortness of breath.  CHF is compensated. -Volume management per renal by dialysis  HLD (hyperlipidemia): -Lipitor  Alcohol use: -CiWA  -Vitamin B1 and folic acid  Abnormal LFTs: ratio of AST/ALT (115/21) is typical for alcohol use. -f/u by CMP   Code Status: limited Family Communication: patient Disposition Plan: home   Consultants:  nephrology  Procedures:    Antibiotics:  unasyn 10/28>>  HPI/Subjective: Sitting up in bed. Reports feeling "much better". No recall of symcope  Objective: Vitals:   11/01/18 0800 11/01/18 0900  BP: (!) 111/58 (!) 109/51  Pulse:    Resp: 20 (!) 22  Temp:    SpO2:      Intake/Output Summary (Last 24 hours) at 11/01/2018 0942 Last data filed at 11/01/2018 4627 Gross per 24 hour  Intake 500 ml  Output -  Net 500 ml   Filed Weights   10/31/18 2004  Weight: 74.8 kg    Exam:   General:  Awake alert well nourished no acute distress  Cardiovascular: rrr no mgr no LE edema  Respiratory: no increase work of  breathing BS slightly diminished in bases no crackles no wheeze  Abdomen: soft +BS non-distended non-tender  Musculoskeletal: joints without swelling/erythema   Data Reviewed: Basic Metabolic Panel: Recent Labs  Lab 10/31/18 2126 11/01/18 0110  NA 135 135  K 6.0* 4.4  CL 96* 95*   CO2 22 24  GLUCOSE 107* 285*  BUN 31* 33*  CREATININE 6.07* 6.30*  CALCIUM 9.5 8.8*   Liver Function Tests: Recent Labs  Lab 10/31/18 2126  AST 115*  ALT 21  ALKPHOS 63  BILITOT 1.2  PROT 7.7  ALBUMIN 3.6   Recent Labs  Lab 10/31/18 2126  LIPASE 34   No results for input(s): AMMONIA in the last 168 hours. CBC: Recent Labs  Lab 10/31/18 2126  WBC 19.8*  NEUTROABS 17.8*  HGB 12.9*  HCT 38.5*  MCV 101.6*  PLT 169   Cardiac Enzymes: No results for input(s): CKTOTAL, CKMB, CKMBINDEX, TROPONINI in the last 168 hours. BNP (last 3 results) No results for input(s): BNP in the last 8760 hours.  ProBNP (last 3 results) No results for input(s): PROBNP in the last 8760 hours.  CBG: Recent Labs  Lab 11/01/18 0120 11/01/18 0222 11/01/18 0525 11/01/18 0558 11/01/18 0935  GLUCAP 283* 233* 69* 95 152*    Recent Results (from the past 240 hour(s))  SARS CORONAVIRUS 2 (TAT 6-24 HRS) Nasopharyngeal Nasopharyngeal Swab     Status: None   Collection Time: 10/31/18 11:46 PM   Specimen: Nasopharyngeal Swab  Result Value Ref Range Status   SARS Coronavirus 2 NEGATIVE NEGATIVE Final    Comment: (NOTE) SARS-CoV-2 target nucleic acids are NOT DETECTED. The SARS-CoV-2 RNA is generally detectable in upper and lower respiratory specimens during the acute phase of infection. Negative results do not preclude SARS-CoV-2 infection, do not rule out co-infections with other pathogens, and should not be used as the sole basis for treatment or other patient management decisions. Negative results must be combined with clinical observations, patient history, and epidemiological information. The expected result is Negative. Fact Sheet for Patients: SugarRoll.be Fact Sheet for Healthcare Providers: https://www.woods-mathews.com/ This test is not yet approved or cleared by the [place] FDA and  has been authorized for detection and/or  diagnosis of SARS-CoV-2 by FDA under an Emergency Use Authorization (EUA). This EUA will remain  in effect (meaning this test can be used) for the duration of the COVID-19 declaration under Section 56 4(b)(1) of the Act, 21 U.S.C. section 360bbb-3(b)(1), unless the authorization is terminated or revoked sooner. Performed at [facility], [place] [address], [place], [place] [address]      Studies: Dg Chest 2 View  Result Date: 10/31/2018 CLINICAL DATA:  Fatigue following dialysis today EXAM: CHEST - 2 VIEW COMPARISON:  10/10/2017 FINDINGS: Cardiac shadow remains enlarged. Postsurgical changes are again seen. Aortic calcifications are noted. The lungs are well aerated bilaterally. Patchy parenchymal opacity is noted in the right lower lobe consistent with acute pneumonia. No bony abnormality is seen. IMPRESSION: Changes consistent with acute right lower lobe pneumonia. Followup PA and lateral chest X-ray is recommended in 3-4 weeks following trial of antibiotic therapy to ensure resolution and exclude underlying malignancy. Electronically Signed   By: Inez Catalina M.D.   On: 10/31/2018 21:24    Scheduled Meds: . allopurinol  100 mg Oral Daily  . atorvastatin  20 mg Oral [id number]  . calcium carbonate  2 tablet Oral TID WC  . cinacalcet  60 mg Oral Once per day on Sun Mon Wed  Fri  . folic acid  1 mg Oral Daily  . heparin  5,000 Units Subcutaneous Q8H  . LORazepam  0-4 mg Intravenous Q6H   Followed by  . [START ON 11/03/2018] LORazepam  0-4 mg Intravenous Q12H  . midodrine  10 mg Oral TID WC  . multivitamin with minerals  1 tablet Oral Daily  . pantoprazole  40 mg Oral Daily  . thiamine  100 mg Oral Daily   Or  . thiamine  100 mg Intravenous Daily   Continuous Infusions: . sodium chloride 100 mL/hr at 11/01/18 0330  . ampicillin-sulbactam (UNASYN) IV 1.5 g (11/01/18 0933)    Principal Problem:   Syncope Active Problems:   ESRD on dialysis ([place])   Hypotension   Hyperkalemia    Lobar pneumonia (HCC)   Sepsis (HCC)   Aspiration pneumonia (HCC)   Essential hypertension   S/P AVR   S/P CABG (coronary artery bypass graft)   Chronic systolic CHF (congestive heart failure) (HCC)   CAD (coronary artery disease)   Alcohol use   GERD (gastroesophageal reflux disease)   Gout   HLD (hyperlipidemia)   Abnormal LFTs    Time spent: 55 minutes    [facility] M NP  [facility]  If 7PM-7AM, please contact night-coverage at [email or web address], password [facility] 11/01/2018, 9:42 AM  LOS: 0 days

## 2018-11-01 NOTE — ED Notes (Signed)
Pt's CBG result was 103. Informed Hannie - RN.

## 2018-11-01 NOTE — Consult Note (Addendum)
[facility] Renal Consultation Note    Indication for Consultation:  Management of ESRD/hemodialysis; anemia, hypertension/volume and secondary hyperparathyroidism  QIH:KVQQ, Tiffany L, DO  HPI: Eric [name] is a 85 y.o. male. ESRD on HD TTS at [facility], first starting on 05/26/13.  Past medical history significant for HTN, Gout, GERD, sCHF with EF 20-25%, multiple WMA's, moderate Hx AS s/p AoVR (12/09/14), CAD s/p 3 vessel CABG (12/09/14) and alcohol use.  Of note, patient is compliant with prescribed dialysis regimen.   Patient seen and examined at bedside.  Patient sent to the ED via EMS by his independent living facility due to hypotension.  Reports fatigue and "passing out" while on dialysis.  He does not remember it but woke up with O2 on so he assumed it had happened.  Denies SOB, CP, fever, chills, and n/v/d.  He does not believe he has gained weight recently.    Pertinent findings in the ED include negative COVID, WBC 19.8 and CXR concerning for possible RLL PNA.      Past Medical History:  Diagnosis Date  . Anxiety   . Arthritis   . Complication of anesthesia    " one time my heart stopped due to a medication that I was on."   . ESRD (end stage renal disease) on dialysis ([place])    Tues, Thursday, Saturday; [facility]; [place] (10/10/2017)  . GERD (gastroesophageal reflux disease)   . Gout   . Heart murmur    as a teen  . Herpes zoster 04/2012  . Hypertension   . Shortness of breath dyspnea    with exertion   Past Surgical History:  Procedure Laterality Date  . AORTIC VALVE REPLACEMENT N/A 12/09/2014   Procedure: AORTIC VALVE REPLACEMENT (AVR) WITH 23MM MAGNA EASE BIOPROSTHETIC VALVE;  Surgeon: Gaye Pollack, MD;  Location: [facility] OR;  Service: Open Heart Surgery;  Laterality: N/A;  . APPENDECTOMY  1944  . AV FISTULA PLACEMENT Left    Left C[place] AVF placed in [place]   . AV FISTULA PLACEMENT Right 03/06/2014   Procedure: ARTERIOVENOUS (AV) FISTULA  CREATION-right radiocephalic;  Surgeon: Mal Misty, MD;  Location: [facility];  Service: Vascular;  Laterality: Right;  . AV FISTULA PLACEMENT Right 07/15/2014   Procedure: ARTERIOVENOUS (AV) FISTULA CREATION;  Surgeon: Elam Dutch, MD;  Location: [place];  Service: Vascular;  Laterality: Right;  . CARDIAC CATHETERIZATION N/A 11/27/2014   Procedure: Right/Left Heart Cath and Coronary Angiography;  Surgeon: Burnell Blanks, MD;  Location: [place] [facility];  Service: Cardiovascular;  Laterality: N/A;  . CATARACT EXTRACTION W/ INTRAOCULAR LENS IMPLANT Bilateral 2014   [facility]  . COLONOSCOPY  2013   Dr. Annamaria Helling [place], [place].  . CORONARY ARTERY BYPASS GRAFT N/A 12/09/2014   Procedure: CORONARY ARTERY BYPASS GRAFTING (CABG), ON PUMP, TIMES THREE, USING LEFT INTERNAL MAMMARY ARTERY, RIGHT GREATER SAPHENOUS VEIN HARVESTED ENDOSCOPICALLY;  Surgeon: Gaye Pollack, MD;  Location: [place];  Service: Open Heart Surgery;  Laterality: N/A;  LIMA-LAD; SVG-OM; SVG-PD  . DIALYSIS FISTULA CREATION  08/04/2012 and 10/13   Dr. Harden Mo  . INSERTION OF DIALYSIS CATHETER Right 01-15-14   Right chest TDC placed by Dr. Augustin Coupe at [facility]  . LIGATION OF ARTERIOVENOUS  FISTULA Right 07/15/2014   Procedure: LIGATION OF ARTERIOVENOUS  FISTULA  (RIGHT RADIOCEPHALIC);  Surgeon: Elam Dutch, MD;  Location: [name];  Service: Vascular;  Laterality: Right;  . LIGATION OF COMPETING BRANCHES OF  ARTERIOVENOUS FISTULA Right 05/08/2014   Procedure: LIGATION OF COMPETING BRANCHES OF RIGHT ARM RADIOCEPHALIC ARTERIOVENOUS FISTULA;  Surgeon: Mal Misty, MD;  Location: [place];  Service: Vascular;  Laterality: Right;  . REVISON OF ARTERIOVENOUS FISTULA Right 07/15/2014   Procedure: EXPLORATION OF ARTERIOVENOUS FISTULA;  Surgeon: Elam Dutch, MD;  Location: [place];  Service: Vascular;  Laterality: Right;  . TEE WITHOUT CARDIOVERSION N/A 12/09/2014   Procedure: TRANSESOPHAGEAL ECHOCARDIOGRAM (TEE);  Surgeon:  Gaye Pollack, MD;  Location: [place];  Service: Open Heart Surgery;  Laterality: N/A;   Family History  Problem Relation Age of Onset  . Diabetes Father   . Lung cancer Father 5       non-smoker  . Cancer Mother        multiple myeloma  . Colon cancer Neg Hx    Social History:  reports that he has never smoked. He has never used smokeless tobacco. He reports current alcohol use of about 7.0 standard drinks of alcohol per week. He reports that he does not use drugs. No Known Allergies Prior to Admission medications   Medication Sig Start Date End Date Taking? Authorizing Provider  allopurinol (ZYLOPRIM) 100 MG tablet TAKE 1 TABLET BY MOUTH DAILY 06/21/18  Yes Reed, Tiffany L, DO  atorvastatin (LIPITOR) 20 MG tablet TAKE 1 TABLET BY MOUTH DAILY FOR CHOLESTEROL Patient taking differently: Take 20 mg by mouth every morning.  06/21/18  Yes Reed, Tiffany L, DO  calcium carbonate (TUMS - DOSED IN MG ELEMENTAL CALCIUM) 500 MG chewable tablet Chew 2 tablets by mouth 3 (three) times daily with meals.    Yes [provider]  carvedilol (COREG) 3.125 MG tablet Take 1 tablet (3.125 mg total) by mouth 2 (two) times daily. Patient taking differently: Take 3.125 mg by mouth See admin instructions. 1 tab twice daily all days EXCEPT on dialysis days take 1 time daily after dialysis 05/26/18 05/21/19 Yes Burtis Junes, NP  cinacalcet (SENSIPAR) 60 MG tablet Take 60 mg by mouth. MondayWednesday, Friday, Sunday   Yes [provider]  omeprazole (PRILOSEC) 20 MG capsule Take 20 mg by mouth daily.    Yes [provider]   Current Facility-Administered Medications  Medication Dose Route Frequency Provider Last Rate Last Dose  . 0.9 %  sodium chloride infusion   Intravenous Continuous Ivor Costa, MD 100 mL/hr at 11/01/18 0330    . acetaminophen (TYLENOL) tablet 650 mg  650 mg Oral Q6H PRN Ivor Costa, MD      . albuterol (VENTOLIN HFA) 108 (90 Base) MCG/ACT inhaler 2 puff  2 puff  Inhalation Q4H PRN Ivor Costa, MD      . allopurinol (ZYLOPRIM) tablet 100 mg  100 mg Oral Daily Ivor Costa, MD      . ampicillin-sulbactam (UNASYN) 1.5 g in sodium chloride 0.9 % 100 mL IVPB  1.5 g Intravenous Q12H Erenest Blank, RPH   Stopped at 11/01/18 1003  . atorvastatin (LIPITOR) tablet 20 mg  20 mg Oral Jaye Beagle, Soledad Gerlach, MD      . calcium carbonate (TUMS - dosed in mg elemental calcium) chewable tablet 400 mg of elemental calcium  2 tablet Oral TID WC Ivor Costa, MD   400 mg of elemental calcium at 11/01/18 0924  . cinacalcet (SENSIPAR) tablet 60 mg  60 mg Oral Once per day on Sun Mon Wed Fri Niu, Xilin, MD      . dextromethorphan-guaiFENesin [facility] DM) 30-600 MG per 12 hr tablet 1  tablet  1 tablet Oral BID PRN Ivor Costa, MD      . folic acid (FOLVITE) tablet 1 mg  1 mg Oral Daily Ivor Costa, MD   1 mg at 11/01/18 0924  . heparin injection 5,000 Units  5,000 Units Subcutaneous Q8H Ivor Costa, MD   5,000 Units at 11/01/18 3734  . LORazepam (ATIVAN) injection 0-4 mg  0-4 mg Intravenous Q6H Ivor Costa, MD       Followed by  . [START ON 11/03/2018] LORazepam (ATIVAN) injection 0-4 mg  0-4 mg Intravenous Q12H Ivor Costa, MD      . LORazepam (ATIVAN) tablet 1-4 mg  1-4 mg Oral Q1H PRN Ivor Costa, MD       Or  . LORazepam (ATIVAN) injection 1-4 mg  1-4 mg Intravenous Q1H PRN Ivor Costa, MD      . midodrine (PROAMATINE) tablet 10 mg  10 mg Oral TID WC Ivor Costa, MD   10 mg at 11/01/18 2876  . multivitamin with minerals tablet 1 tablet  1 tablet Oral Daily Ivor Costa, MD   1 tablet at 11/01/18 8115  . pantoprazole (PROTONIX) EC tablet 40 mg  40 mg Oral Daily Ivor Costa, MD   40 mg at 11/01/18 7262  . thiamine (VITAMIN B-1) tablet 100 mg  100 mg Oral Daily Ivor Costa, MD   100 mg at 11/01/18 0355   Or  . thiamine (B-1) injection 100 mg  100 mg Intravenous Daily Ivor Costa, MD       Current Outpatient Medications  Medication Sig Dispense Refill  . allopurinol (ZYLOPRIM) 100 MG tablet  TAKE 1 TABLET BY MOUTH DAILY 90 tablet 3  . atorvastatin (LIPITOR) 20 MG tablet TAKE 1 TABLET BY MOUTH DAILY FOR CHOLESTEROL (Patient taking differently: Take 20 mg by mouth every morning. ) 90 tablet 3  . calcium carbonate (TUMS - DOSED IN MG ELEMENTAL CALCIUM) 500 MG chewable tablet Chew 2 tablets by mouth 3 (three) times daily with meals.     . carvedilol (COREG) 3.125 MG tablet Take 1 tablet (3.125 mg total) by mouth 2 (two) times daily. (Patient taking differently: Take 3.125 mg by mouth See admin instructions. 1 tab twice daily all days EXCEPT on dialysis days take 1 time daily after dialysis) 180 tablet 3  . cinacalcet (SENSIPAR) 60 MG tablet Take 60 mg by mouth. MondayWednesday, Friday, Sunday    . omeprazole (PRILOSEC) 20 MG capsule Take 20 mg by mouth daily.      Labs: Basic Metabolic Panel: Recent Labs  Lab 10/31/18 2126 11/01/18 0110  NA 135 135  K 6.0* 4.4  CL 96* 95*  CO2 22 24  GLUCOSE 107* 285*  BUN 31* 33*  CREATININE 6.07* 6.30*  CALCIUM 9.5 8.8*   Liver Function Tests: Recent Labs  Lab 10/31/18 2126  AST 115*  ALT 21  ALKPHOS 63  BILITOT 1.2  PROT 7.7  ALBUMIN 3.6   Recent Labs  Lab 10/31/18 2126  LIPASE 34   No results for input(s): AMMONIA in the last 168 hours. CBC: Recent Labs  Lab 10/31/18 2126  WBC 19.8*  NEUTROABS 17.8*  HGB 12.9*  HCT 38.5*  MCV 101.6*  PLT 169   Cardiac Enzymes: No results for input(s): CKTOTAL, CKMB, CKMBINDEX, TROPONINI in the last 168 hours. CBG: Recent Labs  Lab 11/01/18 0120 11/01/18 0222 11/01/18 0525 11/01/18 0558 11/01/18 0935  GLUCAP 283* 233* 69* 95 152*   Iron Studies: No results for input(s): IRON, TIBC, TRANSFERRIN,  FERRITIN in the last 72 hours. Studies/Results: Dg Chest 2 View  Result Date: 10/31/2018 CLINICAL DATA:  Fatigue following dialysis today EXAM: CHEST - 2 VIEW COMPARISON:  10/10/2017 FINDINGS: Cardiac shadow remains enlarged. Postsurgical changes are again seen. Aortic  calcifications are noted. The lungs are well aerated bilaterally. Patchy parenchymal opacity is noted in the right lower lobe consistent with acute pneumonia. No bony abnormality is seen. IMPRESSION: Changes consistent with acute right lower lobe pneumonia. Followup PA and lateral chest X-ray is recommended in 3-4 weeks following trial of antibiotic therapy to ensure resolution and exclude underlying malignancy. Electronically Signed   By: Inez Catalina M.D.   On: 10/31/2018 21:24    ROS: All others negative except those listed in HPI.  Physical Exam: Vitals:   11/01/18 0800 11/01/18 0900 11/01/18 0936 11/01/18 1000  BP: (!) 111/58 (!) 109/51 (!) 108/53 (!) 113/52  Pulse:   69   Resp: 20 (!) 22 (!) 21 18  Temp:      TempSrc:      SpO2:   96%   Weight:      Height:         General: WDWN NAD, well appearing male, sitting in bed Head: NCAT sclera not icteric MMM Neck: Supple. No lymphadenopathy Lungs: CTA bilaterally. +crackles in b/l bases Heart: RRR. No murmur, rubs or gallops.  Abdomen: soft, nontender, +BS, no guarding, no rebound tenderness  Lower extremities:no edema, ischemic changes, or open wounds  Neuro: AAOx3. Moves all extremities spontaneously. Psych:  Responds to questions appropriately with a normal affect. Dialysis Access: RU AVF +b  Dialysis Orders:  TTS - [facility]  3.5hrs, BFR 350, DFR 800,  EDW 39kg, 2K/ 2.25Ca Linear Na, Profile 4  Access: RU AVF  Heparin none  Assessment/Plan: 1.  Syncope - likely related to hypotension at dialysis.  Patient does not have large gains but unable to reach dry weight recently due to hypotension.  Midodrine started. 2. Possible PNA - On ABX. Per primary Not impressed w/ CXR and pt asymptomatic for PNA.  3.  ESRD -  On HD TTS. K 4.4.  Plan for HD tomorrow w/trial midodrine.  4.  Hypotension/volume  - BP soft, start midodrine w/HD.  +crackles in b/l lungs, CXR could show some increased fluid on R, no recent weight gain.  Titrate down  volume as tolerated.  5.  Anemia of CKD - Hgb 12.9. no indication for ESA at this time.  6.  Secondary Hyperparathyroidism -  Ca in goal. Phos in goal as OP.  Not on binder or VDRA.  Continue senspiar.  7.  Nutrition - Renal diet w/fluid restriction 8. CHF EF 20-25% 9. Hx CABG, AVR  Jen Mow, PA-C [facility] Pager: [phone] 11/01/2018, 10:29 AM   Pt seen, examined and agree w assess/plan as above with additions as indicated. Pt may be vol overloaded, has rales on exam. In setting of hypotension will start midodrine.  Has CM w/ EF 20-25%.  [facility] 11/01/2018, 2:41 PM

## 2018-11-01 NOTE — ED Notes (Signed)
ED TO INPATIENT HANDOFF REPORT  ED Nurse Name and Phone #: Davene Costain [phone]  S Name/Age/Gender [name] 85 y.o. male Room/Bed: 009C/009C  Code Status   Code Status: Partial Code  Home/SNF/Other Home Patient oriented to: self, place, time and situation Is this baseline? Yes   Triage Complete: Triage complete  Chief Complaint hypotension after dialysis  Triage Note Pt presents to ED from [facility]. Pt c/o feeling "drained" since after dialysis this morning. Pt reports he was told he passed out during dialysis but has no memory of it. Per independent living facility RN pt hypotensive after dialysis which is normal for him. EMS vitals orthostatic.   Allergies No Known Allergies  Level of Care/Admitting Diagnosis ED Disposition    ED Disposition Condition [facility] Area: [facility] [100100]  Level of Care: Med-Surg [16]  Covid Evaluation: Confirmed COVID Negative  Diagnosis: Syncope [024097]  Admitting Physician: Darliss Cheney [[id number]]  Attending Physician: Darliss Cheney [phone]  Estimated length of stay: past midnight tomorrow  Certification:: I certify this patient will need inpatient services for at least 2 midnights  PT Class (Do Not Modify): Inpatient [101]  PT Acc Code (Do Not Modify): Private [1]       B Medical/Surgery History Past Medical History:  Diagnosis Date  . Anxiety   . Arthritis   . Complication of anesthesia    " one time my heart stopped due to a medication that I was on."   . ESRD (end stage renal disease) on dialysis ([facility])    Tues, Thursday, Saturday; [facility]; [facility] (10/10/2017)  . GERD (gastroesophageal reflux disease)   . Gout   . Heart murmur    as a teen  . Herpes zoster 04/2012  . Hypertension   . Shortness of breath dyspnea    with exertion   Past Surgical History:  Procedure Laterality Date  . AORTIC VALVE REPLACEMENT N/A 12/09/2014   Procedure:  AORTIC VALVE REPLACEMENT (AVR) WITH 23MM MAGNA EASE BIOPROSTHETIC VALVE;  Surgeon: Gaye Pollack, MD;  Location: [facility];  Service: Open Heart Surgery;  Laterality: N/A;  . APPENDECTOMY  1944  . AV FISTULA PLACEMENT Left    Left [place] AVF placed in [place]   . AV FISTULA PLACEMENT Right 03/06/2014   Procedure: ARTERIOVENOUS (AV) FISTULA CREATION-right radiocephalic;  Surgeon: Mal Misty, MD;  Location: [facility] OR;  Service: Vascular;  Laterality: Right;  . AV FISTULA PLACEMENT Right 07/15/2014   Procedure: ARTERIOVENOUS (AV) FISTULA CREATION;  Surgeon: Elam Dutch, MD;  Location: [facility];  Service: Vascular;  Laterality: Right;  . CARDIAC CATHETERIZATION N/A 11/27/2014   Procedure: Right/Left Heart Cath and Coronary Angiography;  Surgeon: Burnell Blanks, MD;  Location: [facility];  Service: Cardiovascular;  Laterality: N/A;  . CATARACT EXTRACTION W/ INTRAOCULAR LENS IMPLANT Bilateral 2014   [facility]  . COLONOSCOPY  2013   Dr. Annamaria Helling [place], [place].  . CORONARY ARTERY BYPASS GRAFT N/A 12/09/2014   Procedure: CORONARY ARTERY BYPASS GRAFTING (CABG), ON PUMP, TIMES THREE, USING LEFT INTERNAL MAMMARY ARTERY, RIGHT GREATER SAPHENOUS VEIN HARVESTED ENDOSCOPICALLY;  Surgeon: Gaye Pollack, MD;  Location: [facility];  Service: Open Heart Surgery;  Laterality: N/A;  LIMA-LAD; SVG-OM; SVG-PD  . DIALYSIS FISTULA CREATION  08/04/2012 and 10/13   Dr. Harden Mo  . INSERTION OF DIALYSIS CATHETER Right 01-15-14   Right chest TDC placed by Dr. Augustin Coupe at  [facility]  . LIGATION OF ARTERIOVENOUS  FISTULA Right 07/15/2014   Procedure: LIGATION OF ARTERIOVENOUS  FISTULA  (RIGHT RADIOCEPHALIC);  Surgeon: Elam Dutch, MD;  Location: [facility];  Service: Vascular;  Laterality: Right;  . LIGATION OF COMPETING BRANCHES OF ARTERIOVENOUS FISTULA Right 05/08/2014   Procedure: LIGATION OF COMPETING BRANCHES OF RIGHT ARM RADIOCEPHALIC ARTERIOVENOUS FISTULA;  Surgeon: Mal Misty, MD;  Location: [facility];  Service: Vascular;  Laterality: Right;  . REVISON OF ARTERIOVENOUS FISTULA Right 07/15/2014   Procedure: EXPLORATION OF ARTERIOVENOUS FISTULA;  Surgeon: Elam Dutch, MD;  Location: [facility];  Service: Vascular;  Laterality: Right;  . TEE WITHOUT CARDIOVERSION N/A 12/09/2014   Procedure: TRANSESOPHAGEAL ECHOCARDIOGRAM (TEE);  Surgeon: Gaye Pollack, MD;  Location: [facility];  Service: Open Heart Surgery;  Laterality: N/A;     A IV Location/Drains/Wounds Patient Lines/Drains/Airways Status   Active Line/Drains/Airways    Name:   Placement date:   Placement time:   Site:   Days:   Peripheral IV 10/10/17 Left Hand   10/10/17    1215    Hand   387   Peripheral IV 10/10/17 Left Antecubital   10/10/17    1243    Antecubital   387   Peripheral IV 10/31/18 Left Forearm   10/31/18    2126    Forearm   1   Fistula / Graft Right Forearm Arteriovenous fistula   03/06/14    1235    Forearm   1701   Fistula / Graft Right Upper arm Arteriovenous fistula   07/15/14    1058    Upper arm   1570   Hemodialysis Catheter   -    -    -      Incision (Closed) 05/08/14 Arm Right   05/08/14    1228     1638   Incision (Closed) 07/15/14 Arm Right;Mid   07/15/14    0957     1570   Incision (Closed) 07/15/14 Arm Right;Lower   07/15/14    1141     1570   Incision (Closed) 07/15/14 Arm Right   07/15/14    1310     1570   Incision (Closed) 12/09/14 Chest Other (Comment)   12/09/14    0919     1423   Incision (Closed) 12/09/14 Leg Right   12/09/14    0919     1423          Intake/Output Last 24 hours  Intake/Output Summary (Last 24 hours) at 11/01/2018 1519 Last data filed at 11/01/2018 1003 Gross per 24 hour  Intake 692.92 ml  Output -  Net 692.92 ml    Labs/Imaging Results for orders placed or performed during the hospital encounter of 10/31/18 (from the past 48 hour(s))  CBC with Differential     Status: Abnormal   Collection Time: 10/31/18  9:26 PM  Result Value Ref Range   WBC 19.8 (H) 4.0 - 10.5  K/uL   RBC 3.79 (L) 4.22 - 5.81 MIL/uL   Hemoglobin 12.9 (L) 13.0 - 17.0 g/dL   HCT 38.5 (L) 39.0 - 52.0 %   MCV 101.6 (H) 80.0 - 100.0 fL   MCH 34.0 26.0 - 34.0 pg   MCHC 33.5 30.0 - 36.0 g/dL   RDW 13.2 11.5 - 15.5 %   Platelets 169 150 - 400 K/uL   nRBC 0.0 0.0 - 0.2 %   Neutrophils Relative % 90 %   Neutro  Abs 17.8 (H) 1.7 - 7.7 K/uL   Lymphocytes Relative 2 %   Lymphs Abs 0.5 (L) 0.7 - 4.0 K/uL   Monocytes Relative 7 %   Monocytes Absolute 1.3 (H) 0.1 - 1.0 K/uL   Eosinophils Relative 0 %   Eosinophils Absolute 0.0 0.0 - 0.5 K/uL   Basophils Relative 0 %   Basophils Absolute 0.1 0.0 - 0.1 K/uL   Immature Granulocytes 1 %   Abs Immature Granulocytes 0.18 (H) 0.00 - 0.07 K/uL    Comment: Performed at [facility] [address] [place], [place] [address]  Comprehensive metabolic panel     Status: Abnormal   Collection Time: 10/31/18  9:26 PM  Result Value Ref Range   Sodium 135 135 - 145 mmol/L   Potassium 6.0 (H) 3.5 - 5.1 mmol/L    Comment: SLIGHT HEMOLYSIS   Chloride 96 (L) 98 - 111 mmol/L   CO2 22 22 - 32 mmol/L   Glucose, Bld 107 (H) 70 - 99 mg/dL   BUN 31 (H) 8 - 23 mg/dL   Creatinine, Ser 6.07 (H) 0.61 - 1.24 mg/dL   Calcium 9.5 8.9 - 10.3 mg/dL   Total Protein 7.7 6.5 - 8.1 g/dL   Albumin 3.6 3.5 - 5.0 g/dL   AST 115 (H) 15 - 41 U/L   ALT 21 0 - 44 U/L   Alkaline Phosphatase 63 38 - 126 U/L   Total Bilirubin 1.2 0.3 - 1.2 mg/dL   GFR calc non Af Amer 8 (L) >60 mL/min   GFR calc Af Amer 9 (L) >60 mL/min   Anion gap 17 (H) 5 - 15    Comment: Performed at [facility], [place] [address], [place], [place] [address]  Lipase, blood     Status: None   Collection Time: 10/31/18  9:26 PM  Result Value Ref Range   Lipase 34 11 - 51 U/L    Comment: Performed at [facility], [place] [address], [place], [place] [address]  Lactic acid, plasma     Status: None   Collection Time: 10/31/18  9:45 PM  Result Value Ref Range   Lactic Acid, Venous 1.9 0.5 -  1.9 mmol/L    Comment: Performed at [facility] [address] [place], [place] [address]  Culture, blood (routine x 2)     Status: None (Preliminary result)   Collection Time: 10/31/18  9:47 PM   Specimen: BLOOD  Result Value Ref Range   Specimen Description BLOOD BLOOD LEFT FOREARM    Special Requests      BOTTLES DRAWN AEROBIC AND ANAEROBIC Blood Culture adequate volume   Culture      NO GROWTH < 12 HOURS Performed at [facility], [place] [address] [place], [place] [address]    Report Status PENDING   Culture, blood (routine x 2)     Status: None (Preliminary result)   Collection Time: 10/31/18  9:54 PM   Specimen: BLOOD RIGHT HAND  Result Value Ref Range   Specimen Description BLOOD RIGHT HAND    Special Requests      BOTTLES DRAWN AEROBIC AND ANAEROBIC Blood Culture adequate volume   Culture      NO GROWTH < 12 HOURS Performed at [facility], [place] [address], [place], [place] [address]    Report Status PENDING   Lactic acid, plasma     Status: Abnormal   Collection Time: 10/31/18 11:40 PM  Result Value Ref Range   Lactic Acid, Venous 2.2 (HH) 0.5 - 1.9 mmol/L    Comment: CRITICAL RESULT CALLED TO, READ BACK BY AND VERIFIED WITH: FLORES M,RN 11/01/18 0052 WAYK Performed at [facility], [address] [place], [place] [address]   SARS CORONAVIRUS 2 (TAT 6-24 HRS) Nasopharyngeal Nasopharyngeal Swab     Status: None   Collection Time: 10/31/18 11:46 PM   Specimen: Nasopharyngeal Swab  Result Value Ref Range   SARS Coronavirus 2 NEGATIVE NEGATIVE    Comment: (NOTE) SARS-CoV-2 target nucleic acids are NOT DETECTED. The SARS-CoV-2 RNA is generally detectable in upper and lower respiratory specimens during the acute phase of infection. Negative results do not preclude SARS-CoV-2 infection, do not rule out co-infections with other pathogens, and should not be used as the sole basis for treatment or other patient management decisions. Negative  results must be combined with clinical observations, patient history, and epidemiological information. The expected result is Negative. Fact Sheet for Patients: SugarRoll.be Fact Sheet for Healthcare Providers: https://www.woods-mathews.com/ This test is not yet approved or cleared by the [place] FDA and  has been authorized for detection and/or diagnosis of SARS-CoV-2 by FDA under an Emergency Use Authorization (EUA). This EUA will remain  in effect (meaning this test can be used) for the duration of the COVID-19 declaration under Section 56 4(b)(1) of the Act, 21 U.S.C. section 360bbb-3(b)(1), unless the authorization is terminated or revoked sooner. Performed at [facility], [place] [address], [place], [place] [address]   CBG monitoring, ED     Status: Abnormal   Collection Time: 11/01/18 12:02 AM  Result Value Ref Range   Glucose-Capillary 207 (H) 70 - 99 mg/dL  Basic metabolic panel     Status: Abnormal   Collection Time: 11/01/18  1:10 AM  Result Value Ref Range   Sodium 135 135 - 145 mmol/L   Potassium 4.4 3.5 - 5.1 mmol/L   Chloride 95 (L) 98 - 111 mmol/L   CO2 24 22 - 32 mmol/L   Glucose, Bld 285 (H) 70 - 99 mg/dL   BUN 33 (H) 8 - 23 mg/dL   Creatinine, Ser 6.30 (H) 0.61 - 1.24 mg/dL   Calcium 8.8 (L) 8.9 - 10.3 mg/dL   GFR calc non Af Amer 7 (L) >60 mL/min   GFR calc Af Amer 8 (L) >60 mL/min   Anion gap 16 (H) 5 - 15    Comment: Performed at [facility] [address], [place], [place] [address]  CBG monitoring, ED     Status: Abnormal   Collection Time: 11/01/18  1:20 AM  Result Value Ref Range   Glucose-Capillary 283 (H) 70 - 99 mg/dL   Comment 1 Document in Chart   Lactic acid, plasma     Status: Abnormal   Collection Time: 11/01/18  1:28 AM  Result Value Ref Range   Lactic Acid, Venous 4.5 (HH) 0.5 - 1.9 mmol/L    Comment: CRITICAL VALUE NOTED.  VALUE IS CONSISTENT WITH PREVIOUSLY REPORTED AND CALLED  VALUE. Performed at [facility], [place] [address], [place], [place] [address]   CBG monitoring, ED     Status: Abnormal   Collection Time: 11/01/18  2:22 AM  Result Value Ref Range   Glucose-Capillary 233 (H) 70 - 99 mg/dL   Comment 1 Document in Chart   Lactic acid, plasma     Status: Abnormal   Collection Time: 11/01/18  4:28 AM  Result Value Ref Range   Lactic Acid, Venous 2.2 (HH) 0.5 - 1.9 mmol/L    Comment: CRITICAL VALUE NOTED.  VALUE IS CONSISTENT WITH PREVIOUSLY REPORTED AND CALLED VALUE. Performed at [facility], [place] [address], [place], [place] [address]   Procalcitonin     Status: None   Collection Time: 11/01/18  4:30 AM  Result Value Ref Range   Procalcitonin 4.92 ng/mL    Comment:        Interpretation: PCT > 2 ng/mL: Systemic infection (sepsis) is likely, unless other causes are known. (NOTE)       Sepsis PCT Algorithm           Lower Respiratory Tract                                      Infection PCT Algorithm    ----------------------------     ----------------------------         PCT < 0.25 ng/mL                PCT < 0.10 ng/mL         Strongly encourage             Strongly discourage   discontinuation of antibiotics    initiation of antibiotics    ----------------------------     -----------------------------       PCT 0.25 - 0.50 ng/mL            PCT 0.10 - 0.25 ng/mL               OR       >80% decrease in PCT            Discourage initiation of                                            antibiotics      Encourage discontinuation           of antibiotics    ----------------------------     -----------------------------         PCT >= 0.50 ng/mL              PCT 0.26 - 0.50 ng/mL               AND       <80% decrease in PCT              Encourage initiation of                                             antibiotics       Encourage continuation           of antibiotics    ----------------------------     -----------------------------         PCT >= 0.50 ng/mL                  PCT > 0.50 ng/mL               AND         increase in PCT  Strongly encourage                                      initiation of antibiotics    Strongly encourage escalation           of antibiotics                                     -----------------------------                                           PCT <= 0.25 ng/mL                                                 OR                                        > 80% decrease in PCT                                     Discontinue / Do not initiate                                             antibiotics Performed at [facility], [place] [address], [place], [place] [address]   CBG monitoring, ED     Status: Abnormal   Collection Time: 11/01/18  5:25 AM  Result Value Ref Range   Glucose-Capillary 69 (L) 70 - 99 mg/dL  CBG monitoring, ED     Status: None   Collection Time: 11/01/18  5:58 AM  Result Value Ref Range   Glucose-Capillary 95 70 - 99 mg/dL  Lactic acid, plasma     Status: Abnormal   Collection Time: 11/01/18  6:06 AM  Result Value Ref Range   Lactic Acid, Venous 2.8 (HH) 0.5 - 1.9 mmol/L    Comment: CRITICAL VALUE NOTED.  VALUE IS CONSISTENT WITH PREVIOUSLY REPORTED AND CALLED VALUE. Performed at [facility], [place] [address] [place], [place] [address]   CBG monitoring, ED     Status: Abnormal   Collection Time: 11/01/18  9:35 AM  Result Value Ref Range   Glucose-Capillary 152 (H) 70 - 99 mg/dL  Strep pneumoniae urinary antigen     Status: None   Collection Time: 11/01/18 11:22 AM  Result Value Ref Range   Strep Pneumo Urinary Antigen NEGATIVE NEGATIVE    Comment:        Infection due to S. pneumoniae cannot be absolutely ruled out since the antigen present may be below the detection limit of the test. Performed at [facility], [place] [address] [place], [place] [address]   Urinalysis, Routine w reflex microscopic     Status: Abnormal    Collection Time: 11/01/18 11:30 AM  Result Value Ref Range  Color, Urine YELLOW YELLOW   APPearance HAZY (A) CLEAR   Specific Gravity, Urine 1.014 1.005 - 1.030   pH 9.0 (H) 5.0 - 8.0   Glucose, UA 150 (A) NEGATIVE mg/dL   Hgb urine dipstick SMALL (A) NEGATIVE   Bilirubin Urine NEGATIVE NEGATIVE   Ketones, ur NEGATIVE NEGATIVE mg/dL   Protein, ur 100 (A) NEGATIVE mg/dL   Nitrite NEGATIVE NEGATIVE   Leukocytes,Ua NEGATIVE NEGATIVE   RBC / HPF 0-5 0 - 5 RBC/hpf   WBC, UA 6-10 0 - 5 WBC/hpf   Bacteria, UA RARE (A) NONE SEEN   Squamous Epithelial / LPF 0-5 0 - 5    Comment: Performed at [facility] [address], [place], [place] [address]   Dg Chest 2 View  Result Date: 10/31/2018 CLINICAL DATA:  Fatigue following dialysis today EXAM: CHEST - 2 VIEW COMPARISON:  10/10/2017 FINDINGS: Cardiac shadow remains enlarged. Postsurgical changes are again seen. Aortic calcifications are noted. The lungs are well aerated bilaterally. Patchy parenchymal opacity is noted in the right lower lobe consistent with acute pneumonia. No bony abnormality is seen. IMPRESSION: Changes consistent with acute right lower lobe pneumonia. Followup PA and lateral chest X-ray is recommended in 3-4 weeks following trial of antibiotic therapy to ensure resolution and exclude underlying malignancy. Electronically Signed   By: Inez Catalina M.D.   On: 10/31/2018 21:24    Pending Labs Unresulted Labs (From admission, onward)    Start     Ordered   11/02/18 0500  Comprehensive metabolic panel  Tomorrow morning,   R     11/01/18 0941   11/02/18 0500  CBC  Tomorrow morning,   R     11/01/18 0941   11/01/18 1440  Expectorated sputum assessment w rflx to resp cult  Once,   R     11/01/18 1439   11/01/18 1416  Lactic acid, plasma  STAT Now then every 3 hours,   R (with STAT occurrences)     11/01/18 1416   10/31/18 2333  Legionella Pneumophila Serogp 1 Ur Ag  Once,   STAT     10/31/18 2333   10/31/18 2333   Influenza panel by PCR (type A & B)  Add-on,   AD     10/31/18 2333   10/31/18 2333  Respiratory Panel by PCR  (Respiratory virus panel with precautions)  Add-on,   AD     10/31/18 2333   10/31/18 2147  Urine culture  ONCE - STAT,   STAT     10/31/18 2147   Signed and Held  Renal function panel  Once,   R     Signed and Held   Signed and Held  CBC  Once,   R     Signed and Held          Vitals/Pain Today's Vitals   11/01/18 1326 11/01/18 1355 11/01/18 1500 11/01/18 1515  BP: 134/62 (!) 113/52 108/64   Pulse: 71 70 60   Resp:  16 16   Temp:  97.7 F (36.5 C)    TempSrc:  Oral    SpO2:  96% 94%   Weight:      Height:      PainSc:    0-No pain    Isolation Precautions Droplet precaution  Medications Medications  midodrine (PROAMATINE) tablet 10 mg (10 mg Oral Given 11/01/18 1323)  heparin injection 5,000 Units (5,000 Units Subcutaneous Given 11/01/18 1516)  dextromethorphan-guaiFENesin (MUCINEX DM) 30-600 MG per  12 hr tablet 1 tablet (has no administration in time range)  albuterol (VENTOLIN HFA) 108 (90 Base) MCG/ACT inhaler 2 puff (has no administration in time range)  acetaminophen (TYLENOL) tablet 650 mg (has no administration in time range)  LORazepam (ATIVAN) tablet 1-4 mg (has no administration in time range)    Or  LORazepam (ATIVAN) injection 1-4 mg (has no administration in time range)  thiamine (VITAMIN B-1) tablet 100 mg (100 mg Oral Given 11/01/18 0925)    Or  thiamine (B-1) injection 100 mg ( Intravenous See Alternative 62/69/48 5462)  folic acid (FOLVITE) tablet 1 mg (1 mg Oral Given 11/01/18 0924)  multivitamin with minerals tablet 1 tablet (1 tablet Oral Given 11/01/18 0924)  LORazepam (ATIVAN) injection 0-4 mg (0 mg Intravenous Not Given 11/01/18 1327)    Followed by  LORazepam (ATIVAN) injection 0-4 mg (has no administration in time range)  ampicillin-sulbactam (UNASYN) 1.5 g in sodium chloride 0.9 % 100 mL IVPB (0 g Intravenous Stopped 11/01/18  1003)  allopurinol (ZYLOPRIM) tablet 100 mg (100 mg Oral Given 11/01/18 1054)  atorvastatin (LIPITOR) tablet 20 mg (20 mg Oral Not Given 11/01/18 1050)  cinacalcet (SENSIPAR) tablet 60 mg (60 mg Oral Given 11/01/18 1322)  calcium carbonate (TUMS - dosed in mg elemental calcium) chewable tablet 400 mg of elemental calcium (400 mg of elemental calcium Oral Given 11/01/18 1322)  pantoprazole (PROTONIX) EC tablet 40 mg (40 mg Oral Given 11/01/18 0924)  0.9 %  sodium chloride infusion ( Intravenous New Bag/Given 11/01/18 1244)  Chlorhexidine Gluconate Cloth 2 % PADS 6 each (has no administration in time range)  sodium chloride 0.9 % bolus 500 mL (0 mLs Intravenous Stopped 10/31/18 2222)  albuterol (VENTOLIN HFA) 108 (90 Base) MCG/ACT inhaler 1-2 puff (2 puffs Inhalation Given 10/31/18 2333)  sodium zirconium cyclosilicate (LOKELMA) packet 10 g (10 g Oral Given 10/31/18 2335)  insulin aspart (novoLOG) injection 10 Units (10 Units Intravenous Given 10/31/18 2335)    And  dextrose 50 % solution 50 mL (50 mLs Intravenous Given 10/31/18 2335)  dextrose 50 % solution 50 mL (50 mLs Intravenous Given 11/01/18 0013)  sodium chloride 0.9 % bolus 500 mL (0 mLs Intravenous Stopped 11/01/18 0836)    Mobility walks Low fall risk   Focused Assessments Neuro Assessment Handoff:   Cardiac Rhythm: Normal sinus rhythm, Bundle branch block NIH Stroke Scale ( + Modified Stroke Scale Criteria)  LOC Questions (1b. )   +: Answers both questions correctly LOC Commands (1c. )   + : Performs both tasks correctly Best Gaze (2. )  +: Normal Visual (3. )  +: No visual loss Motor Arm, Left (5a. )   +: No drift Motor Arm, Right (5b. )   +: No drift Motor Leg, Left (6a. )   +: No drift Motor Leg, Right (6b. )   +: No drift Sensory (8. )   +: Normal, no sensory loss Best Language (9. )   +: No aphasia Extinction/Inattention (11.)   +: No Abnormality Modified SS Total  +: 0     Neuro Assessment: Within Defined  Limits Neuro Checks:      Last Documented NIHSS Modified Score: 0 (11/01/18 0500) Has TPA been given? No If patient is a Neuro Trauma and patient is going to OR before floor call report to [place] nurse: [phone] or [phone]     R Recommendations: See Admitting Provider Note  Report given to:   Additional Notes:

## 2018-11-02 ENCOUNTER — Inpatient Hospital Stay (HOSPITAL_COMMUNITY): Payer: Medicare Other

## 2018-11-02 DIAGNOSIS — R55 Syncope and collapse: Secondary | ICD-10-CM | POA: Diagnosis not present

## 2018-11-02 LAB — URINE CULTURE: Culture: 20000 — AB

## 2018-11-02 LAB — COMPREHENSIVE METABOLIC PANEL
ALT: 16 U/L (ref 0–44)
AST: 40 U/L (ref 15–41)
Albumin: 2.7 g/dL — ABNORMAL LOW (ref 3.5–5.0)
Alkaline Phosphatase: 46 U/L (ref 38–126)
Anion gap: 12 (ref 5–15)
BUN: 57 mg/dL — ABNORMAL HIGH (ref 8–23)
CO2: 21 mmol/L — ABNORMAL LOW (ref 22–32)
Calcium: 9 mg/dL (ref 8.9–10.3)
Chloride: 104 mmol/L (ref 98–111)
Creatinine, Ser: 8.28 mg/dL — ABNORMAL HIGH (ref 0.61–1.24)
GFR calc Af Amer: 6 mL/min — ABNORMAL LOW (ref >60)
GFR calc non Af Amer: 5 mL/min — ABNORMAL LOW (ref >60)
Glucose, Bld: 106 mg/dL — ABNORMAL HIGH (ref 70–99)
Potassium: 5.2 mmol/L — ABNORMAL HIGH (ref 3.5–5.1)
Sodium: 137 mmol/L (ref 135–145)
Total Bilirubin: 0.6 mg/dL (ref 0.3–1.2)
Total Protein: 6 g/dL — ABNORMAL LOW (ref 6.5–8.1)

## 2018-11-02 LAB — CBC
HCT: 29 % — ABNORMAL LOW (ref 39.0–52.0)
Hemoglobin: 9.6 g/dL — ABNORMAL LOW (ref 13.0–17.0)
MCH: 34.4 pg — ABNORMAL HIGH (ref 26.0–34.0)
MCHC: 33.1 g/dL (ref 30.0–36.0)
MCV: 103.9 fL — ABNORMAL HIGH (ref 80.0–100.0)
Platelets: 140 10*3/uL — ABNORMAL LOW (ref 150–400)
RBC: 2.79 MIL/uL — ABNORMAL LOW (ref 4.22–5.81)
RDW: 13.5 % (ref 11.5–15.5)
WBC: 11.4 10*3/uL — ABNORMAL HIGH (ref 4.0–10.5)
nRBC: 0 % (ref 0.0–0.2)

## 2018-11-02 LAB — PHOSPHORUS: Phosphorus: 4 mg/dL (ref 2.5–4.6)

## 2018-11-02 MED ORDER — METOCLOPRAMIDE HCL 5 MG PO TABS
5.0000 mg | ORAL_TABLET | Freq: Three times a day (TID) | ORAL | Status: DC | PRN
  Administered 2018-11-02 – 2018-11-04 (×5): 5 mg via ORAL
  Filled 2018-11-02 (×5): qty 1

## 2018-11-02 MED ORDER — SODIUM CHLORIDE 0.9 % IV SOLN: 100.0000 mL | INTRAVENOUS | Status: DC | PRN

## 2018-11-02 MED ORDER — LIDOCAINE-PRILOCAINE 2.5-2.5 % EX CREA: 1.0000 "application " | TOPICAL_CREAM | CUTANEOUS | Status: DC | PRN

## 2018-11-02 MED ORDER — HEPARIN SODIUM (PORCINE) 1000 UNIT/ML DIALYSIS: 1000.0000 [IU] | INTRAMUSCULAR | Status: DC | PRN

## 2018-11-02 MED ORDER — LIDOCAINE HCL (PF) 1 % IJ SOLN: 5.0000 mL | INTRAMUSCULAR | Status: DC | PRN

## 2018-11-02 MED ORDER — ALTEPLASE 2 MG IJ SOLR: 2.0000 mg | Freq: Once | INTRAMUSCULAR | Status: DC | PRN

## 2018-11-02 MED ORDER — PENTAFLUOROPROP-TETRAFLUOROETH EX AERO: 1.0000 "application " | INHALATION_SPRAY | CUTANEOUS | Status: DC | PRN

## 2018-11-02 NOTE — Progress Notes (Signed)
PROGRESS NOTE  [name] GQQ:[id number] DOB: 1930-12-28 DOA: 10/31/2018 PCP: Gayland Curry, DO  HPI/Recap of past 24 hours: Eric [name] is a 85 y.o. male with medical history significant of ESRD-HD (TTS), HTN, HLD, GERD, gout, AV replacement (12/09/14), CAD, CABG, sCHF with EF 20-25%, alcohol use, who presents with syncope.  Pt states that he was feeling normal this AM. He went to dialysis and completed his normal dialysis session. Patient reports immediately after finishing dialysis he passed out while sitting in the chair. No fall. No any injury. He does not remember what happened exactly. Hestates thishashappened in the past. He had nausea and vomited a little bit.  Currently no nausea,vomiting, diarrhea or abdominal pain. EMS reports orthostatic vitals. Pt was hypotensive with Bp 71/47 which improved to 119/60 after given 500 cc of NS in ED. patient denies chest pain, shortness of breath, cough, fever or chills.  No symptoms of UTI.  He has generalized weakness and fatigue, but no unilateral numbness or tingling his extremities.  No facial droop or slurred speech.  ED Course: pt was found to have WBC 19.8, lactic acid of 1.9, pending COVID-19 test, potassium 6.0, bicarbonate 22, creatinine 6.07, BUN 31, abnormal liver function (ALP 63, AST 115, ALT 21, total bilirubin 1.2), temperature normal, heart rate 70 to 80s, tachypnea, oxygen saturation 94% on room air.  Chest x-ray showed right lower lobe patchy infiltration.    11/02/18: Patient was seen and examined at his bedside this morning prior to going to the hemodialysis center.  He states he feels better this morning.  Blood pressure is improved on midodrine 10 mg 3 times daily.   Assessment/Plan: Principal Problem:   Syncope Active Problems:   Essential hypertension   GERD (gastroesophageal reflux disease)   Gout   ESRD on dialysis (HCC)   S/P AVR   Hypotension   Hyperkalemia   S/P CABG (coronary artery bypass graft)  Chronic systolic CHF (congestive heart failure) (HCC)   Lobar pneumonia (HCC)   Sepsis (HCC)   HLD (hyperlipidemia)   CAD (coronary artery disease)   Aspiration pneumonia (HCC)   Alcohol use   Abnormal LFTs  Syncope suspect secondary to orthostatic hypotension:  Clinically improved on midodrine with blood pressure maintaining map greater than 65.   Continue midodrine 10 mg 3 times daily  Continue fall precautions  Continue PT OT  Possible aspiration pneumonia versus lobar pneumonia: Patient does not have respiratory symptoms, but she has leukocytosis with WBC 19.8 and elevated procalcitonin 4.92 on 11/01/18  Continue IV antibiotics On Unasyn Continue bronchodilators Blood cultures x2 - in less than 12 hours Independently reviewed chest x-ray done on 10/31/2018 which shows cardiomegaly with right mid and lower lobes infiltrates Continue to maintain O2 saturation greater than 92%. Repeat procalcitonin Home O2 evaluation tomorrow after hemodialysis  ESRD on HD TTS Planned HD today 11/02/2018 Continue to monitor vital signs and electrolytes  Acute blood loss/anemia of chronic disease in the setting of ESRD Drop in hemoglobin 9.6 on 11/02/2018 from 12.9 on 10/31/2018, unclear etiology No sign of overt bleeding Repeat H&H after hemodialysis  Resolved hypotension with midodrine with history of of essential hypertension Pt was hypotensive with Bp 71/47 which improved to 119/60 after given 500 cc of NS in ED. This is likely due to fluid removal from HD. Sepsis is also possible Continue midodrine 10 mg tid Continue to closely monitor vital signs  GERD (gastroesophageal reflux disease): -Continue protonix  Gout: -Continue allopurinol  Hyperkalemia:  Treatment  per nephrology with hemodialysis  CAD: s/p of CABG (coronary artery bypass graft). Denies anginal symptoms at the time of this visit.   -continue lipitor  Chronic systolic CHF (congestive heart failure) ([facility]):  2D  echo on 05/19/2018 showed EF 20-25%.   Appears euvolemic on exam No leg edema.  No shortness of breath.  CHF is compensated. -Volume management per renal by dialysis  HLD (hyperlipidemia): -Continue Lipitor  Alcohol use: -Continue CiWA  -Continue Vitamin B1 and folic acid  Abnormal LFTs: ratio of AST/ALT (115/21) is typical for alcohol use. -f/u by CMP    DVT ppx: SQ Heparin 3 times daily  Code Status: Partial code.  Patient wants to be partial code, OK for CPR, but no intubation). Family Communication: None at bed side.   Disposition Plan:  Anticipate discharge back to previous independent living facility once hemodynamically stable.  Consults called:  Nephrology.   Objective: Vitals:   11/02/18 1100 11/02/18 1130 11/02/18 1150 11/02/18 1244  BP: (!) 100/50 (!) 93/47 (!) 101/46 (!) 107/55  Pulse: (!) 55 63 61 60  Resp:   18 17  Temp:   97.7 F (36.5 C) 98 F (36.7 C)  TempSrc:   Oral Oral  SpO2:   97% 99%  Weight:   73 kg   Height:        Intake/Output Summary (Last 24 hours) at 11/02/2018 1344 Last data filed at 11/02/2018 1300 Gross per 24 hour  Intake 3329.64 ml  Output 2600 ml  Net 729.64 ml   Filed Weights   11/01/18 2134 11/02/18 0742 11/02/18 1150  Weight: 74.6 kg 75.2 kg 73 kg    Exam:  . General: 85 y.o. year-old male well developed well nourished in no acute distress.  Alert and interactive. . Cardiovascular: Regular rate and rhythm with no rubs or gallops.  No thyromegaly or JVD noted.   Marland Kitchen Respiratory: Mild rales at bases.  No wheezing noted.  Poor inspiratory effort.   . Abdomen: Soft nontender nondistended with normal bowel sounds x4 quadrants. . Musculoskeletal: No lower extremity edema. 2/4 pulses in all 4 extremities. Marland Kitchen Psychiatry: Mood is appropriate for condition and setting   Data Reviewed: CBC: Recent Labs  Lab 10/31/18 2126 11/02/18 0536  WBC 19.8* 11.4*  NEUTROABS 17.8*  --   HGB 12.9* 9.6*  HCT 38.5* 29.0*  MCV  101.6* 103.9*  PLT 169 222*   Basic Metabolic Panel: Recent Labs  Lab 10/31/18 2126 11/01/18 0110 11/02/18 0536 11/02/18 0639  NA 135 135 137  --   K 6.0* 4.4 5.2*  --   CL 96* 95* 104  --   CO2 22 24 21*  --   GLUCOSE 107* 285* 106*  --   BUN 31* 33* 57*  --   CREATININE 6.07* 6.30* 8.28*  --   CALCIUM 9.5 8.8* 9.0  --   PHOS  --   --   --  4.0   GFR: Estimated Creatinine Clearance: 6.4 mL/min (A) (by C-G formula based on SCr of 8.28 mg/dL (H)). Liver Function Tests: Recent Labs  Lab 10/31/18 2126 11/02/18 0536  AST 115* 40  ALT 21 16  ALKPHOS 63 46  BILITOT 1.2 0.6  PROT 7.7 6.0*  ALBUMIN 3.6 2.7*   Recent Labs  Lab 10/31/18 2126  LIPASE 34   No results for input(s): AMMONIA in the last 168 hours. Coagulation Profile: No results for input(s): INR, PROTIME in the last 168 hours. Cardiac Enzymes: No results for input(s):  CKTOTAL, CKMB, CKMBINDEX, TROPONINI in the last 168 hours. BNP (last 3 results) No results for input(s): PROBNP in the last 8760 hours. HbA1C: No results for input(s): HGBA1C in the last 72 hours. CBG: Recent Labs  Lab 11/01/18 0222 11/01/18 0525 11/01/18 0558 11/01/18 0935 11/01/18 1545  GLUCAP 233* 69* 95 152* 103*   Lipid Profile: No results for input(s): CHOL, HDL, LDLCALC, TRIG, CHOLHDL, LDLDIRECT in the last 72 hours. Thyroid Function Tests: No results for input(s): TSH, T4TOTAL, FREET4, T3FREE, THYROIDAB in the last 72 hours. Anemia Panel: No results for input(s): VITAMINB12, FOLATE, FERRITIN, TIBC, IRON, RETICCTPCT in the last 72 hours. Urine analysis:    Component Value Date/Time   COLORURINE YELLOW 11/01/2018 1130   APPEARANCEUR HAZY (A) 11/01/2018 1130   LABSPEC 1.014 11/01/2018 1130   PHURINE 9.0 (H) 11/01/2018 1130   GLUCOSEU 150 (A) 11/01/2018 1130   HGBUR SMALL (A) 11/01/2018 1130   BILIRUBINUR NEGATIVE 11/01/2018 1130   KETONESUR NEGATIVE 11/01/2018 1130   PROTEINUR 100 (A) 11/01/2018 1130   NITRITE NEGATIVE  11/01/2018 1130   LEUKOCYTESUR NEGATIVE 11/01/2018 1130   Sepsis Labs: @LABRCNTIP (procalcitonin:4,lacticidven:4)  ) Recent Results (from the past 240 hour(s))  Culture, blood (routine x 2)     Status: None (Preliminary result)   Collection Time: 10/31/18  9:47 PM   Specimen: BLOOD  Result Value Ref Range Status   Specimen Description BLOOD BLOOD LEFT FOREARM  Final   Special Requests   Final    BOTTLES DRAWN AEROBIC AND ANAEROBIC Blood Culture adequate volume   Culture   Final    NO GROWTH 2 DAYS Performed at [facility], [place] [address] [place], [place] [address]    Report Status PENDING  Incomplete  Culture, blood (routine x 2)     Status: None (Preliminary result)   Collection Time: 10/31/18  9:54 PM   Specimen: BLOOD RIGHT HAND  Result Value Ref Range Status   Specimen Description BLOOD RIGHT HAND  Final   Special Requests   Final    BOTTLES DRAWN AEROBIC AND ANAEROBIC Blood Culture adequate volume   Culture   Final    NO GROWTH 2 DAYS Performed at [facility], [place] [address] [place], [place] [address]    Report Status PENDING  Incomplete  SARS CORONAVIRUS 2 (TAT 6-24 HRS) Nasopharyngeal Nasopharyngeal Swab     Status: None   Collection Time: 10/31/18 11:46 PM   Specimen: Nasopharyngeal Swab  Result Value Ref Range Status   SARS Coronavirus 2 NEGATIVE NEGATIVE Final    Comment: (NOTE) SARS-CoV-2 target nucleic acids are NOT DETECTED. The SARS-CoV-2 RNA is generally detectable in upper and lower respiratory specimens during the acute phase of infection. Negative results do not preclude SARS-CoV-2 infection, do not rule out co-infections with other pathogens, and should not be used as the sole basis for treatment or other patient management decisions. Negative results must be combined with clinical observations, patient history, and epidemiological information. The expected result is Negative. Fact Sheet for Patients:  SugarRoll.be Fact Sheet for Healthcare Providers: https://www.woods-mathews.com/ This test is not yet approved or cleared by the [place] FDA and  has been authorized for detection and/or diagnosis of SARS-CoV-2 by FDA under an Emergency Use Authorization (EUA). This EUA will remain  in effect (meaning this test can be used) for the duration of the COVID-19 declaration under Section 56 4(b)(1) of the Act, 21 U.S.C. section 360bbb-3(b)(1), unless the authorization is terminated or revoked sooner. Performed at  [facility], [place] [address], [place], [place] [address]       Studies: Dg Chest 2 View  Result Date: 11/02/2018 CLINICAL DATA:  Volume overload. EXAM: CHEST - 2 VIEW COMPARISON:  10/31/2018. FINDINGS: Prior cardiac valve replacement. Cardiomegaly. No pulmonary venous congestion. Persistent mild right base infiltrate. Persistent mild bibasilar atelectasis. No prominent pleural effusion. No pneumothorax. IMPRESSION: 1. Persistent mild right base infiltrate. Persistent mild bibasilar atelectasis. 2. Prior cardiac valve replacement. Stable cardiomegaly. No pulmonary venous congestion. Electronically Signed   By: Bulloch   On: 11/02/2018 12:33    Scheduled Meds: . allopurinol  100 mg Oral Daily  . atorvastatin  20 mg Oral Daily  . calcium carbonate  2 tablet Oral TID WC  . Chlorhexidine Gluconate Cloth  6 each Topical Q0600  . cinacalcet  60 mg Oral Once per day on Sun Mon Wed Fri  . folic acid  1 mg Oral Daily  . heparin  5,000 Units Subcutaneous Q8H  . LORazepam  0-4 mg Intravenous Q6H   Followed by  . [START ON 11/03/2018] LORazepam  0-4 mg Intravenous Q12H  . midodrine  10 mg Oral TID WC  . multivitamin with minerals  1 tablet Oral Daily  . pantoprazole  40 mg Oral Daily  . thiamine  100 mg Oral Daily   Or  . thiamine  100 mg Intravenous Daily    Continuous Infusions: . ampicillin-sulbactam (UNASYN) IV 1.5 g  (11/02/18 1304)     LOS: 1 day     Kayleen Memos, MD [facility] Pager [phone]  If 7PM-7AM, please contact night-coverage [email or web address] Password [email or web address] 11/02/2018, 1:44 PM

## 2018-11-02 NOTE — Progress Notes (Addendum)
[name] Progress Note   Subjective:   Seen and examined at bedside. States he is feeling well today, much better. Denies SOB, CP, n/v/d and fever/chills, melena, hematochezia and abnormal bleeding.  Plan for repeat CXR post HD to see if improvement from yesterday.   Objective Vitals:   11/02/18 0750 11/02/18 0752 11/02/18 0800 11/02/18 0830  BP: 126/70 126/66 (!) 123/57 (!) 113/55  Pulse: 69 64 60 63  Resp:      Temp:      TempSrc:      SpO2:      Weight:      Height:       Physical Exam General:NAD, well appearing, elderly male, sitting in bed Heart:RRR Lungs: mostly CTAB, +crackles on RLL Abdomen:soft, NTND Extremities:no LE edema Dialysis Access: RU AVF accessed   [place] [place] Weights   10/31/18 2004 11/01/18 2134 11/02/18 0742  Weight: 74.8 kg 74.6 kg 75.2 kg    Intake/Output Summary (Last 24 hours) at 11/02/2018 0844 Last data filed at 11/02/2018 0548 Gross per 24 hour  Intake 3182.56 ml  Output 0 ml  Net 3182.56 ml    Additional Objective Labs: Basic Metabolic Panel: Recent Labs  Lab 10/31/18 2126 11/01/18 0110 11/02/18 0536 11/02/18 0639  NA 135 135 137  --   K 6.0* 4.4 5.2*  --   CL 96* 95* 104  --   CO2 22 24 21*  --   GLUCOSE 107* 285* 106*  --   BUN 31* 33* 57*  --   CREATININE 6.07* 6.30* 8.28*  --   CALCIUM 9.5 8.8* 9.0  --   PHOS  --   --   --  4.0   Liver Function Tests: Recent Labs  Lab 10/31/18 2126 11/02/18 0536  AST 115* 40  ALT 21 16  ALKPHOS 63 46  BILITOT 1.2 0.6  PROT 7.7 6.0*  ALBUMIN 3.6 2.7*   Recent Labs  Lab 10/31/18 2126  LIPASE 34   CBC: Recent Labs  Lab 10/31/18 2126 11/02/18 0536  WBC 19.8* 11.4*  NEUTROABS 17.8*  --   HGB 12.9* 9.6*  HCT 38.5* 29.0*  MCV 101.6* 103.9*  PLT 169 140*   Blood Culture    Component Value Date/Time   SDES BLOOD RIGHT HAND 10/31/2018 2154   SPECREQUEST  10/31/2018 2154    BOTTLES DRAWN AEROBIC AND ANAEROBIC Blood Culture adequate volume   CULT   10/31/2018 2154    NO GROWTH 2 DAYS Performed at [facility], [address], [place], [place] [address]    REPTSTATUS PENDING 10/31/2018 2154   CBG: Recent Labs  Lab 11/01/18 0222 11/01/18 0525 11/01/18 0558 11/01/18 0935 11/01/18 1545  GLUCAP 233* 69* 95 152* 103*   Studies/Results: Dg Chest 2 View  Result Date: 10/31/2018 CLINICAL DATA:  Fatigue following dialysis today EXAM: CHEST - 2 VIEW COMPARISON:  10/10/2017 FINDINGS: Cardiac shadow remains enlarged. Postsurgical changes are again seen. Aortic calcifications are noted. The lungs are well aerated bilaterally. Patchy parenchymal opacity is noted in the right lower lobe consistent with acute pneumonia. No bony abnormality is seen. IMPRESSION: Changes consistent with acute right lower lobe pneumonia. Followup PA and lateral chest X-ray is recommended in 3-4 weeks following trial of antibiotic therapy to ensure resolution and exclude underlying malignancy. Electronically Signed   By: Inez Catalina M.D.   On: 10/31/2018 21:24    Medications: . sodium chloride    . sodium chloride    . ampicillin-sulbactam (UNASYN) IV  1.5 g (11/01/18 2227)   . allopurinol  100 mg Oral Daily  . atorvastatin  20 mg Oral Daily  . calcium carbonate  2 tablet Oral TID WC  . Chlorhexidine Gluconate Cloth  6 each Topical Q0600  . cinacalcet  60 mg Oral Once per day on Sun Mon Wed Fri  . folic acid  1 mg Oral Daily  . heparin  5,000 Units Subcutaneous Q8H  . LORazepam  0-4 mg Intravenous Q6H   Followed by  . [START ON 11/03/2018] LORazepam  0-4 mg Intravenous Q12H  . midodrine  10 mg Oral TID WC  . multivitamin with minerals  1 tablet Oral Daily  . pantoprazole  40 mg Oral Daily  . thiamine  100 mg Oral Daily   Or  . thiamine  100 mg Intravenous Daily    Dialysis Orders: TTS - NW 4hrs, BFR 400, DFR 800,  EDW 75kg, 2K/ 2Ca Linear Na, Profile 4  Access: RU AVF  Heparin none Venofer 50mg  qHD  Assessment/Plan: 1.  Syncope - likely  related to hypotension at dialysis.  Patient does not have large gains but unable to reach dry weight recently due to hypotension.  Midodrine started.  Feeling much better. 2. Possible PNA - On ABX. Per primary. Not impressed w/ CXR and pt asymptomatic for PNA. Repeat CXR today post HD to see if improvement.  3.  ESRD -  On HD TTS. K 5.2.  HD today per regular schedule.  4.  Hypotension/volume  - BP soft, midodrine started. +crackles on R.  CXR yesterday suspicious for ^volume. Net UF goal today 3L 5.  Anemia of CKD - Hgb 12.9>9.6. no indication for ESA at this time.  6.  Secondary Hyperparathyroidism -  Ca and phos in goal.  Not on binder or VDRA.  Continue senspiar.  7.  Nutrition - Renal diet w/fluid restriction 8. CHF EF 20-25% 9. Hx CABG, AVR 10. Dispo - ok to d/c after CXR from a renal standpoint    Jen Mow, PA-C [facility] Pager: [phone] 11/02/2018,8:44 AM  LOS: 1 day   Pt seen, examined and agree w A/P as above. Our assessment on admission was possible vol excess and hypotension limiting UF, we added midodrine and are challenging pt's dry wt today on HD.  If stable post HD should be ok for dc.  Kelly Splinter  MD 11/02/2018, 9:59 AM

## 2018-11-02 NOTE — Evaluation (Signed)
Physical Therapy Evaluation Patient Details Name: Eric Eric [name] MRN: [id number] DOB: 02/02/1930 Today's Date: 11/02/2018   History of Present Illness  85 yo male admitted to ED on 10/31/18 from [name] for fatigue, syncope at HD. Pt with medical diagnosis of PNA. PMH includes ESRD on HD T/Th/Sat, HTN, HLD, GERD, gout, AV 12/2014, CAD s/p CABG 2016, CHF, alcohol abuse.  Clinical Impression   Pt presents with LE generalized weakness, increased time and effort to mobilize, gait deficits including shuffling-like gait, and decreased activity tolerance. Pt to benefit from acute PT to address deficits. Pt ambulated hallway distance without AD, with no losses of balance noted. Pt appears to be close to baseline level of functioning. Pt lives in [name] with his wife, who per pt report has some dementia and uses a RW. PT feels pt will progress well with acute PT, no PT follow up recommended at this time. PT to progress mobility as tolerated, and will continue to follow acutely.      Follow Up Recommendations No PT follow up;Supervision for mobility/OOB    Equipment Recommendations  None recommended by PT    Recommendations for Other Services       Precautions / Restrictions Precautions Precautions: Fall(moderate) Restrictions Weight Bearing Restrictions: No      Mobility  Bed Mobility Overal bed mobility: Modified Independent             General bed mobility comments: Increased time with use of bedrails, requires multiple scoots to reach EOB.  Transfers Overall transfer level: Needs assistance Equipment used: None Transfers: Sit to/from Stand Sit to Stand: From elevated surface;Min guard         General transfer comment: Min guard for safety, pt with self steadying upon standing. First power up unsuccessful, second successful with increased time to rise.  Ambulation/Gait Ambulation/Gait assistance: Supervision;Min guard Gait Distance (Feet): 200 Feet Assistive device:  None Gait Pattern/deviations: Step-through pattern;Decreased stride length;Shuffle Gait velocity: slightly decr   General Gait Details: Min guard to supervision for safety. Verbal cuing for upright posture and looking forward down hallway as opposed to downward, taking larger steps, and increasing foot clearance with increased hip and knee flexion. Pt with decreased arm swing noted.  Stairs            Wheelchair Mobility    Modified Rankin (Stroke Patients Only)       Balance Overall balance assessment: Mild deficits observed, not formally tested                                           Pertinent Vitals/Pain Pain Assessment: No/denies pain    Home Living Family/patient expects to be discharged to:: Private residence Living Arrangements: Spouse/significant other Available Help at Discharge: Family;Available PRN/intermittently Type of Home: Independent living facility([facility]) Home Access: Level entry;Elevator     Home Layout: One level Home Equipment: None Additional Comments: pt reports he does not need and has not needed AD    Prior Function Level of Independence: Independent         Comments: Pt reports walking independently, last fall was 4 years ago and it was a fluke incident, and performs all ADLs without assist.     Hand Dominance   Dominant Hand: Right    Extremity/Trunk Assessment   Upper Extremity Assessment Upper Extremity Assessment: Overall WFL for tasks assessed    Lower Extremity Assessment Lower Extremity  Assessment: Generalized weakness    Cervical / Trunk Assessment Cervical / Trunk Assessment: Kyphotic(with forward head, semi-flexible with verbal cuing)  Communication   Communication: No difficulties  Cognition Arousal/Alertness: Awake/alert Behavior During Therapy: WFL for tasks assessed/performed Overall Cognitive Status: Within Functional Limits for tasks assessed                                  General Comments: Pt pleasant, conversive.      General Comments General comments (skin integrity, edema, etc.): VSS - SpO2 94% immediately post-ambulation, HR 84 bpm    Exercises     Assessment/Plan    PT Assessment Patient needs continued PT services  PT Problem List Decreased strength;Decreased mobility;Decreased activity tolerance;Decreased balance       PT Treatment Interventions Therapeutic activities;Gait training;Functional mobility training;Therapeutic exercise;Patient/family education    PT Goals (Current goals can be found in the Care Plan section)  Acute Rehab PT Goals Patient Stated Goal: go home to wife PT Goal Formulation: With patient Time For Goal Achievement: 11/16/18 Potential to Achieve Goals: Good    Frequency Min 3X/week   Barriers to discharge        Co-evaluation               AM-PAC PT "6 Clicks" Mobility  Outcome Measure Help needed turning from your back to your side while in a flat bed without using bedrails?: None Help needed moving from lying on your back to sitting on the side of a flat bed without using bedrails?: None Help needed moving to and from a bed to a chair (including a wheelchair)?: None Help needed standing up from a chair using your arms (e.g., wheelchair or bedside chair)?: None Help needed to walk in hospital room?: A Little Help needed climbing 3-5 steps with a railing? : A Little 6 Click Score: 22    End of Session Equipment Utilized During Treatment: Gait belt Activity Tolerance: Patient tolerated treatment well Patient left: in chair;with call bell/phone within reach;with chair alarm set Nurse Communication: Mobility status PT Visit Diagnosis: Difficulty in walking, not elsewhere classified (R26.2);Other abnormalities of gait and mobility (R26.89)    Time: 1420-1440 PT Time Calculation (min) (ACUTE ONLY): 20 min   Charges:   PT Evaluation $PT Eval Low Complexity: 1 Low         [name],  PT Acute Rehabilitation Services Pager [phone]  Office [phone]  [name] 11/02/2018, 3:35 PM

## 2018-11-02 NOTE — Procedures (Signed)
   I was present at this dialysis session, have reviewed the session itself and made  appropriate changes Kelly Splinter MD [place] pager [phone]   11/02/2018, 10:01 AM

## 2018-11-02 NOTE — Progress Notes (Signed)
PT Cancellation Note  Patient Details Name: [name] MRN: [id number] DOB: 17-Feb-1930   Cancelled Treatment:    Reason Eval/Treat Not Completed: Patient at procedure or test/unavailable - Pt at HD this am, PT to check back this pm.  Julien Girt, PT Acute Rehabilitation Services Pager [phone]  Office [phone]    Virgina Deakins D Elonda Husky 11/02/2018, 8:43 AM

## 2018-11-02 NOTE — Plan of Care (Signed)
  Problem: Education: Goal: Knowledge of disease and its progression will improve Outcome: Progressing   

## 2018-11-03 DIAGNOSIS — R55 Syncope and collapse: Secondary | ICD-10-CM | POA: Diagnosis not present

## 2018-11-03 LAB — CBC
HCT: 28.3 % — ABNORMAL LOW (ref 39.0–52.0)
Hemoglobin: 9.4 g/dL — ABNORMAL LOW (ref 13.0–17.0)
MCH: 33.9 pg (ref 26.0–34.0)
MCHC: 33.2 g/dL (ref 30.0–36.0)
MCV: 102.2 fL — ABNORMAL HIGH (ref 80.0–100.0)
Platelets: 134 10*3/uL — ABNORMAL LOW (ref 150–400)
RBC: 2.77 MIL/uL — ABNORMAL LOW (ref 4.22–5.81)
RDW: 13.4 % (ref 11.5–15.5)
WBC: 8.5 10*3/uL (ref 4.0–10.5)
nRBC: 0 % (ref 0.0–0.2)

## 2018-11-03 LAB — BASIC METABOLIC PANEL
Anion gap: 13 (ref 5–15)
BUN: 39 mg/dL — ABNORMAL HIGH (ref 8–23)
CO2: 25 mmol/L (ref 22–32)
Calcium: 8.8 mg/dL — ABNORMAL LOW (ref 8.9–10.3)
Chloride: 100 mmol/L (ref 98–111)
Creatinine, Ser: 5.87 mg/dL — ABNORMAL HIGH (ref 0.61–1.24)
GFR calc Af Amer: 9 mL/min — ABNORMAL LOW (ref >60)
GFR calc non Af Amer: 8 mL/min — ABNORMAL LOW (ref >60)
Glucose, Bld: 91 mg/dL (ref 70–99)
Potassium: 4.3 mmol/L (ref 3.5–5.1)
Sodium: 138 mmol/L (ref 135–145)

## 2018-11-03 LAB — PROCALCITONIN: Procalcitonin: 5.92 ng/mL

## 2018-11-03 MED ORDER — MIDODRINE HCL 5 MG PO TABS
ORAL_TABLET | ORAL | Status: AC
  Administered 2018-11-03: 10 mg
  Filled 2018-11-03: qty 2

## 2018-11-03 MED ORDER — CHLORHEXIDINE GLUCONATE CLOTH 2 % EX PADS: 6.0000 | MEDICATED_PAD | Freq: Every day | CUTANEOUS | Status: DC

## 2018-11-03 NOTE — Evaluation (Signed)
Clinical/Bedside Swallow Evaluation Patient Details  Name: Eric [name] MRN: [id number] Date of Birth: 1930-03-24  Today's Date: 11/03/2018 Time: SLP Start Time (ACUTE ONLY): 1535 SLP Stop Time (ACUTE ONLY): 1555 SLP Time Calculation (min) (ACUTE ONLY): 20 min  Past Medical History:  Past Medical History:  Diagnosis Date  . Anxiety   . Arthritis   . Complication of anesthesia    " one time my heart stopped due to a medication that I was on."   . ESRD (end stage renal disease) on dialysis ([place])    Tues, Thursday, Saturday; [facility]; [facility] (10/10/2017)  . GERD (gastroesophageal reflux disease)   . Gout   . Heart murmur    as a teen  . Herpes zoster 04/2012  . Hypertension   . Pneumonia 10/2018  . Shortness of breath dyspnea    with exertion   Past Surgical History:  Past Surgical History:  Procedure Laterality Date  . AORTIC VALVE REPLACEMENT N/A 12/09/2014   Procedure: AORTIC VALVE REPLACEMENT (AVR) WITH 23MM MAGNA EASE BIOPROSTHETIC VALVE;  Surgeon: Gaye Pollack, MD;  Location: [facility] OR;  Service: Open Heart Surgery;  Laterality: N/A;  . APPENDECTOMY  1944  . AV FISTULA PLACEMENT Left    Left [place] AVF placed in [place]   . AV FISTULA PLACEMENT Right 03/06/2014   Procedure: ARTERIOVENOUS (AV) FISTULA CREATION-right radiocephalic;  Surgeon: Mal Misty, MD;  Location: [facility];  Service: Vascular;  Laterality: Right;  . AV FISTULA PLACEMENT Right 07/15/2014   Procedure: ARTERIOVENOUS (AV) FISTULA CREATION;  Surgeon: Elam Dutch, MD;  Location: [facility];  Service: Vascular;  Laterality: Right;  . CARDIAC CATHETERIZATION N/A 11/27/2014   Procedure: Right/Left Heart Cath and Coronary Angiography;  Surgeon: Burnell Blanks, MD;  Location: [facility];  Service: Cardiovascular;  Laterality: N/A;  . CATARACT EXTRACTION W/ INTRAOCULAR LENS IMPLANT Bilateral 2014   [facility]  . COLONOSCOPY  2013   Dr. Annamaria Helling [place], [place].  . CORONARY ARTERY  BYPASS GRAFT N/A 12/09/2014   Procedure: CORONARY ARTERY BYPASS GRAFTING (CABG), ON PUMP, TIMES THREE, USING LEFT INTERNAL MAMMARY ARTERY, RIGHT GREATER SAPHENOUS VEIN HARVESTED ENDOSCOPICALLY;  Surgeon: Gaye Pollack, MD;  Location: [facility];  Service: Open Heart Surgery;  Laterality: N/A;  LIMA-LAD; SVG-OM; SVG-PD  . DIALYSIS FISTULA CREATION  08/04/2012 and 10/13   Dr. Harden Mo  . INSERTION OF DIALYSIS CATHETER Right 01-15-14   Right chest TDC placed by Dr. Augustin Coupe at [facility]  . LIGATION OF ARTERIOVENOUS  FISTULA Right 07/15/2014   Procedure: LIGATION OF ARTERIOVENOUS  FISTULA  (RIGHT RADIOCEPHALIC);  Surgeon: Elam Dutch, MD;  Location: [facility];  Service: Vascular;  Laterality: Right;  . LIGATION OF COMPETING BRANCHES OF ARTERIOVENOUS FISTULA Right 05/08/2014   Procedure: LIGATION OF COMPETING BRANCHES OF RIGHT ARM RADIOCEPHALIC ARTERIOVENOUS FISTULA;  Surgeon: Mal Misty, MD;  Location: [facility];  Service: Vascular;  Laterality: Right;  . REVISON OF ARTERIOVENOUS FISTULA Right 07/15/2014   Procedure: EXPLORATION OF ARTERIOVENOUS FISTULA;  Surgeon: Elam Dutch, MD;  Location: [facility];  Service: Vascular;  Laterality: Right;  . TEE WITHOUT CARDIOVERSION N/A 12/09/2014   Procedure: TRANSESOPHAGEAL ECHOCARDIOGRAM (TEE);  Surgeon: Gaye Pollack, MD;  Location: [facility];  Service: Open Heart Surgery;  Laterality: N/A;   HPI:  History of Present Illness   Assessment / Plan / Recommendation Clinical Impression   Pt presents with s/s of grossly intact oropharyngeal swallowing function.  Pt  demonstrated no overt s/s of aspiration with solids or liquids.  His oral phase was efficient for containing and clearing boluses from the oral cavity without leaving residue post swallow.  Pt endorses no significant history of difficulty swallowing although he endorses severe GERD that is well managed by medications.  I would recommend that pt remain on his currently prescribed diet of regular textures and  thin liquids.  No further ST needs are indicated at this time.        Aspiration Risk  No limitations    Diet Recommendation Regular;Thin liquid   Liquid Administration via: Cup;Straw Medication Administration: Whole meds with liquid Supervision: Patient able to self feed Compensations: Slow rate;Small sips/bites Postural Changes: Seated upright at 90 degrees;Remain upright for at least 30 minutes after po intake    Other  Recommendations Oral Care Recommendations: Oral care BID   Follow up Recommendations None        Swallow Study   General HPI: History of Present Illness Type of Study: Bedside Swallow Evaluation Previous Swallow Assessment: none on record Diet Prior to this Study: Regular;Thin liquids Temperature Spikes Noted: No Respiratory Status: Room air History of Recent Intubation: No Behavior/Cognition: Alert;Cooperative;Pleasant mood Oral Cavity Assessment: Within Functional Limits Oral Cavity - Dentition: Adequate natural dentition Vision: Functional for self-feeding Self-Feeding Abilities: Able to feed self Patient Positioning: Upright in chair Baseline Vocal Quality: Normal    Oral/Motor/Sensory Function Overall Oral Motor/Sensory Function: Within functional limits   Ice Chips     Thin Liquid Thin Liquid: Within functional limits    Nectar Thick     Honey Thick     Puree     Solid     Solid: Within functional limits      Sian Joles, Elmyra Ricks L 11/03/2018,3:57 PM

## 2018-11-03 NOTE — Progress Notes (Addendum)
[facility] Progress Note   Subjective:   Patient seen in room. BP stable overnight. Reports he still has some swelling in his hands. States he has had a lot of nausea after dialysis lately. Denies SOB, dyspnea, CP, abdominal pain, N/V/D. Net UF 2.6L yesterday. CXR after HD showed improvement in edema.   Objective Vitals:   11/03/18 0520 11/03/18 0943 11/03/18 1055 11/03/18 1104  BP: 131/64 123/65 129/60 140/66  Pulse: 62 (!) 57 66 60  Resp: 17  18   Temp: 97.7 F (36.5 C) 97.7 F (36.5 C) 98.8 F (37.1 C)   TempSrc: Oral Oral Oral   SpO2: 95% 96% 95%   Weight:   76.9 kg   Height:       Physical Exam General: Well developed, well nourished male. Alert and in NAD Heart: RRR, no murmurs, rubs or gallops Lungs: Unlabored on RA. Occasional crackle RLL Abdomen: Soft, non-tender, non-distended. +BS Extremities: trace edema bilateral lower extremities, + edema bilateral hands  Dialysis Access: RUE AVF + bruit  Additional Objective Labs: Basic Metabolic Panel: Recent Labs  Lab 11/01/18 0110 11/02/18 0536 11/02/18 0639 11/03/18 0427  NA 135 137  --  138  K 4.4 5.2*  --  4.3  CL 95* 104  --  100  CO2 24 21*  --  25  GLUCOSE 285* 106*  --  91  BUN 33* 57*  --  39*  CREATININE 6.30* 8.28*  --  5.87*  CALCIUM 8.8* 9.0  --  8.8*  PHOS  --   --  4.0  --    Liver Function Tests: Recent Labs  Lab 10/31/18 2126 11/02/18 0536  AST 115* 40  ALT 21 16  ALKPHOS 63 46  BILITOT 1.2 0.6  PROT 7.7 6.0*  ALBUMIN 3.6 2.7*   Recent Labs  Lab 10/31/18 2126  LIPASE 34   CBC: Recent Labs  Lab 10/31/18 2126 11/02/18 0536 11/03/18 0427  WBC 19.8* 11.4* 8.5  NEUTROABS 17.8*  --   --   HGB 12.9* 9.6* 9.4*  HCT 38.5* 29.0* 28.3*  MCV 101.6* 103.9* 102.2*  PLT 169 140* 134*   Blood Culture    Component Value Date/Time   SDES URINE, CLEAN CATCH 11/01/2018 1122   SPECREQUEST  11/01/2018 1122    NONE Performed at [facility], [place] [address],  [place], [place] [address]    CULT 20,000 COLONIES/mL YEAST (A) 11/01/2018 1122   REPTSTATUS 11/02/2018 FINAL 11/01/2018 1122    CBG: Recent Labs  Lab 11/01/18 0222 11/01/18 0525 11/01/18 0558 11/01/18 0935 11/01/18 1545  GLUCAP 233* 69* 95 152* 103*    Studies/Results: Dg Chest 2 View  Result Date: 11/02/2018 CLINICAL DATA:  Volume overload. EXAM: CHEST - 2 VIEW COMPARISON:  10/31/2018. FINDINGS: Prior cardiac valve replacement. Cardiomegaly. No pulmonary venous congestion. Persistent mild right base infiltrate. Persistent mild bibasilar atelectasis. No prominent pleural effusion. No pneumothorax. IMPRESSION: 1. Persistent mild right base infiltrate. Persistent mild bibasilar atelectasis. 2. Prior cardiac valve replacement. Stable cardiomegaly. No pulmonary venous congestion. Electronically Signed   By: [place]   On: 11/02/2018 12:33   Medications: . ampicillin-sulbactam (UNASYN) IV 1.5 g (11/02/18 2153)   . allopurinol  100 mg Oral Daily  . atorvastatin  20 mg Oral Daily  . calcium carbonate  2 tablet Oral TID WC  . Chlorhexidine Gluconate Cloth  6 each Topical Q0600  . cinacalcet  60 mg Oral Once per day on Sun Mon Wed Fri  .  folic acid  1 mg Oral Daily  . heparin  5,000 Units Subcutaneous Q8H  . LORazepam  0-4 mg Intravenous Q12H  . midodrine  10 mg Oral TID WC  . multivitamin with minerals  1 tablet Oral Daily  . pantoprazole  40 mg Oral Daily  . thiamine  100 mg Oral Daily   Or  . thiamine  100 mg Intravenous Daily    Dialysis Orders: TTS -NW 4hrs, BFR400, DFR800, EDW 75kg,2K/2Ca Linear Na, Profile 4  Access:RU AVF Heparinnone Venofer 50mg  qHD   Assessment/Plan: 1. Syncope - likely related to hypotension at dialysis. Patient does not have large gains but unable to reach dry weight recently due to hypotension. Midodrine started, BP stable.  2. Possible PNA - On ABX. Per primary.WBC trending down. Repeat CXR yesterday improved.  3. ESRD -  On HD TTS. K 4.3. Short extra treatment today as below, then resume regular schedule.  4. Hypotension/volume - BP soft with syncope prior to admission, midodrine started and BP has improved. Initial CXR yesterday suspicious for  Increased volume, repeat CXR 10/29 improved. Still with occasional crackles and + edema in hands and legs. Will plan abbreviated extra treatment today for volume, then resume TTS schedule. Will need EDW decreased at discharge.  5. Anemia of CKD - Hgb 9.4, trending down. Will start ESA at discharge.  6. Secondary Hyperparathyroidism - Corrected calcium 9.8, phos within goal. Not on binder or VDRA. Continue senspiar.  7. Nutrition - Renal diet w/fluid restriction 8. CHF EF 20-25% 9. Hx CABG, AVR   Anice Paganini, PA-C 11/03/2018, 11:45 AM  [facility] Pager: [phone]  Pt seen, examined and agree w A/P as above. Vol overloaded, extra HD today, needs better fluid restriction. Midodrine helping a lot.  Needs lower edw.  Kelly Splinter  MD 11/03/2018, 3:43 PM

## 2018-11-03 NOTE — Progress Notes (Signed)
PROGRESS NOTE  Eric [name] QQV:[id number] DOB: 1930-11-11 DOA: 10/31/2018 PCP: Gayland Curry, DO  HPI/Recap of past 24 hours: Eric [name] is a 85 y.o. male with medical history significant of ESRD-HD (TTS), HTN, HLD, GERD, gout, AV replacement (12/09/14), CAD, CABG, sCHF with EF 20-25%, alcohol use, who presents with syncope.  Pt states that he was feeling normal this AM. He went to dialysis and completed his normal dialysis session. Patient reports immediately after finishing dialysis he passed out while sitting in the chair. No fall. No any injury. He does not remember what happened exactly. Hestates thishashappened in the past. He had nausea and vomited a little bit.  Currently no nausea,vomiting, diarrhea or abdominal pain. EMS reports orthostatic vitals. Pt was hypotensive with Bp 71/47 which improved to 119/60 after given 500 cc of NS in ED. patient denies chest pain, shortness of breath, cough, fever or chills.  No symptoms of UTI.  He has generalized weakness and fatigue, but no unilateral numbness or tingling his extremities.  No facial droop or slurred speech.  ED Course: pt was found to have WBC 19.8, lactic acid of 1.9, pending COVID-19 test, potassium 6.0, bicarbonate 22, creatinine 6.07, BUN 31, abnormal liver function (ALP 63, AST 115, ALT 21, total bilirubin 1.2), temperature normal, heart rate 70 to 80s, tachypnea, oxygen saturation 94% on room air.  Chest x-ray showed right lower lobe patchy infiltration.    Started on midodrine 10 mg 3 times daily with good response.   11/03/18: Patient was seen and examined at his bedside this morning.  Reports 1 loose stools which may be related to IV antibiotics.  Denies abdominal cramping or nausea.  Worsening procalcitonin 4.92>> 5.92 with right middle lobe infiltrates on chest x-ray.  Speech therapy consulted for possible swallow evaluation.   Assessment/Plan: Principal Problem:   Syncope Active Problems:   Essential  hypertension   GERD (gastroesophageal reflux disease)   Gout   ESRD on dialysis (HCC)   S/P AVR   Hypotension   Hyperkalemia   S/P CABG (coronary artery bypass graft)   Chronic systolic CHF (congestive heart failure) (HCC)   Lobar pneumonia (HCC)   Sepsis (HCC)   HLD (hyperlipidemia)   CAD (coronary artery disease)   Aspiration pneumonia (HCC)   Alcohol use   Abnormal LFTs  Syncope suspect secondary to orthostatic hypotension:  Clinically improved on midodrine with blood pressure maintaining map greater than 65.   Continue midodrine 10 mg 3 times daily  Continue fall precautions  Continue PT OT  Possible aspiration pneumonia versus right middle lobe community-acquired pneumonia:  Presented with chronic cough, leukocytosis with WBC 19.8 and elevated procalcitonin 4.92 on 11/01/18  Repeat procalcitonin 5.92 on 11/02/2028 Continue IV antibiotics. On Unasyn Continue bronchodilators Blood cultures x2 -to date Independently reviewed chest x-ray done on 10/31/2018 which shows cardiomegaly with right mid and lower lobes infiltrates Independently since this x-ray done on 11/02/18 by middle lobe Speech therapy was consulted for possible swallow evaluation Continue to maintain O2 saturation greater than 92%  Loose stool, possibly antibiotics indused. Continue to monitor Afebrile with no leukocytosis Denies abdominal pain or nausea or abdominal distention at the time of this visit  ESRD on HD TTS Completed HD 11/02/2018 HD planned on 11/03/2018 Continue to monitor vital signs and electrolytes  Acute blood loss/anemia of chronic disease in the setting of ESRD Drop in hemoglobin 9.6 on 11/02/2018 from 12.9 on 10/31/2018, unclear etiology No sign of overt bleeding Repeat H&H after hemodialysis  Resolved hypotension with midodrine with history of of essential hypertension Pt was hypotensive with Bp 71/47 which improved to 119/60 after given 500 cc of NS in ED. This is likely due  to fluid removal from HD. Sepsis is also possible Continue midodrine 10 mg tid Continue to closely monitor vital signs  GERD (gastroesophageal reflux disease): -Continue protonix  Gout: -Continue allopurinol  Hyperkalemia:  Treatment per nephrology with hemodialysis  CAD: s/p of CABG (coronary artery bypass graft). Denies anginal symptoms at the time of this visit.   -continue lipitor  Chronic systolic CHF (congestive heart failure) ([place]):  2D echo on 05/19/2018 showed EF 20-25%.   Appears euvolemic on exam No leg edema.  No shortness of breath.  CHF is compensated. -Volume management per renal by dialysis  HLD (hyperlipidemia): -Continue Lipitor  Alcohol use: -Continue CiWA  -Continue Vitamin B1 and folic acid  Abnormal LFTs: ratio of AST/ALT (115/21) is typical for alcohol use. -f/u by CMP    DVT ppx: SQ Heparin 3 times daily  Code Status: Partial code.  Patient wants to be partial code, OK for CPR, but no intubation). Family Communication: None at bed side.   Disposition Plan:  Anticipate discharge back to previous independent living facility once hemodynamically stable.  Consults called:  Nephrology.   Objective: Vitals:   11/03/18 1200 11/03/18 1230 11/03/18 1300 11/03/18 1330  BP: (!) 123/53 (!) 110/56 (!) 128/58 (!) 131/57  Pulse: (!) 47 (!) 52 (!) 54 (!) 51  Resp:      Temp:      TempSrc:      SpO2:      Weight:      Height:        Intake/Output Summary (Last 24 hours) at 11/03/2018 1409 Last data filed at 11/03/2018 0900 Gross per 24 hour  Intake 440 ml  Output 20 ml  Net 420 ml   Filed Weights   11/02/18 1150 11/02/18 2120 11/03/18 1055  Weight: 73 kg 73 kg 76.9 kg    Exam:  . General: 85 y.o. year-old male well-developed well-nourished no acute distress.  Alert and interactive.   . Cardiovascular: Regular rate and rhythm no rubs or gallops.  No JVD or thyromegaly noted.   Marland Kitchen Respiratory: Fine rales at bases no wheezing noted.    . Abdomen: Soft nontender nondistended with bowel sounds present. . Musculoskeletal: No lower extremity edema.  Palpable pulses in all 4 extremities.   Marland Kitchen Psychiatry: Mood is appropriate for condition and setting.   Data Reviewed: CBC: Recent Labs  Lab 10/31/18 2126 11/02/18 0536 11/03/18 0427  WBC 19.8* 11.4* 8.5  NEUTROABS 17.8*  --   --   HGB 12.9* 9.6* 9.4*  HCT 38.5* 29.0* 28.3*  MCV 101.6* 103.9* 102.2*  PLT 169 140* 696*   Basic Metabolic Panel: Recent Labs  Lab 10/31/18 2126 11/01/18 0110 11/02/18 0536 11/02/18 0639 11/03/18 0427  NA 135 135 137  --  138  K 6.0* 4.4 5.2*  --  4.3  CL 96* 95* 104  --  100  CO2 22 24 21*  --  25  GLUCOSE 107* 285* 106*  --  91  BUN 31* 33* 57*  --  39*  CREATININE 6.07* 6.30* 8.28*  --  5.87*  CALCIUM 9.5 8.8* 9.0  --  8.8*  PHOS  --   --   --  4.0  --    GFR: Estimated Creatinine Clearance: 9 mL/min (A) (by C-G formula based on SCr of  5.87 mg/dL (H)). Liver Function Tests: Recent Labs  Lab 10/31/18 2126 11/02/18 0536  AST 115* 40  ALT 21 16  ALKPHOS 63 46  BILITOT 1.2 0.6  PROT 7.7 6.0*  ALBUMIN 3.6 2.7*   Recent Labs  Lab 10/31/18 2126  LIPASE 34   No results for input(s): AMMONIA in the last 168 hours. Coagulation Profile: No results for input(s): INR, PROTIME in the last 168 hours. Cardiac Enzymes: No results for input(s): CKTOTAL, CKMB, CKMBINDEX, TROPONINI in the last 168 hours. BNP (last 3 results) No results for input(s): PROBNP in the last 8760 hours. HbA1C: No results for input(s): HGBA1C in the last 72 hours. CBG: Recent Labs  Lab 11/01/18 0222 11/01/18 0525 11/01/18 0558 11/01/18 0935 11/01/18 1545  GLUCAP 233* 69* 95 152* 103*   Lipid Profile: No results for input(s): CHOL, HDL, LDLCALC, TRIG, CHOLHDL, LDLDIRECT in the last 72 hours. Thyroid Function Tests: No results for input(s): TSH, T4TOTAL, FREET4, T3FREE, THYROIDAB in the last 72 hours. Anemia Panel: No results for input(s):  VITAMINB12, FOLATE, FERRITIN, TIBC, IRON, RETICCTPCT in the last 72 hours. Urine analysis:    Component Value Date/Time   COLORURINE YELLOW 11/01/2018 1130   APPEARANCEUR HAZY (A) 11/01/2018 1130   LABSPEC 1.014 11/01/2018 1130   PHURINE 9.0 (H) 11/01/2018 1130   GLUCOSEU 150 (A) 11/01/2018 1130   HGBUR SMALL (A) 11/01/2018 1130   BILIRUBINUR NEGATIVE 11/01/2018 1130   KETONESUR NEGATIVE 11/01/2018 1130   PROTEINUR 100 (A) 11/01/2018 1130   NITRITE NEGATIVE 11/01/2018 1130   LEUKOCYTESUR NEGATIVE 11/01/2018 1130   Sepsis Labs: @LABRCNTIP (procalcitonin:4,lacticidven:4)  ) Recent Results (from the past 240 hour(s))  Culture, blood (routine x 2)     Status: None (Preliminary result)   Collection Time: 10/31/18  9:47 PM   Specimen: BLOOD  Result Value Ref Range Status   Specimen Description BLOOD BLOOD LEFT FOREARM  Final   Special Requests   Final    BOTTLES DRAWN AEROBIC AND ANAEROBIC Blood Culture adequate volume   Culture   Final    NO GROWTH 3 DAYS Performed at [facility], [place] [address] [place], [place] [address]    Report Status PENDING  Incomplete  Culture, blood (routine x 2)     Status: None (Preliminary result)   Collection Time: 10/31/18  9:54 PM   Specimen: BLOOD RIGHT HAND  Result Value Ref Range Status   Specimen Description BLOOD RIGHT HAND  Final   Special Requests   Final    BOTTLES DRAWN AEROBIC AND ANAEROBIC Blood Culture adequate volume   Culture   Final    NO GROWTH 3 DAYS Performed at [facility], [place] [address] [place], [place] [address]    Report Status PENDING  Incomplete  SARS CORONAVIRUS 2 (TAT 6-24 HRS) Nasopharyngeal Nasopharyngeal Swab     Status: None   Collection Time: 10/31/18 11:46 PM   Specimen: Nasopharyngeal Swab  Result Value Ref Range Status   SARS Coronavirus 2 NEGATIVE NEGATIVE Final    Comment: (NOTE) SARS-CoV-2 target nucleic acids are NOT DETECTED. The SARS-CoV-2 RNA is generally detectable in upper and  lower respiratory specimens during the acute phase of infection. Negative results do not preclude SARS-CoV-2 infection, do not rule out co-infections with other pathogens, and should not be used as the sole basis for treatment or other patient management decisions. Negative results must be combined with clinical observations, patient history, and epidemiological information. The expected result is Negative. Fact Sheet for Patients:  SugarRoll.be Fact Sheet for Healthcare Providers: https://www.woods-mathews.com/ This test is not yet approved or cleared by the [place] FDA and  has been authorized for detection and/or diagnosis of SARS-CoV-2 by FDA under an Emergency Use Authorization (EUA). This EUA will remain  in effect (meaning this test can be used) for the duration of the COVID-19 declaration under Section 56 4(b)(1) of the Act, 21 U.S.C. section 360bbb-3(b)(1), unless the authorization is terminated or revoked sooner. Performed at [facility], [place] [address] [place], [place] [address]   Urine culture     Status: Abnormal   Collection Time: 11/01/18 11:22 AM   Specimen: Urine, Clean Catch  Result Value Ref Range Status   Specimen Description URINE, CLEAN CATCH  Final   Special Requests   Final    NONE Performed at [facility], [place] [address] [place], [place] [address]    Culture 20,000 COLONIES/mL YEAST (A)  Final   Report Status 11/02/2018 FINAL  Final      Studies: No results found.  Scheduled Meds: . allopurinol  100 mg Oral Daily  . atorvastatin  20 mg Oral Daily  . calcium carbonate  2 tablet Oral TID WC  . Chlorhexidine Gluconate Cloth  6 each Topical Q0600  . cinacalcet  60 mg Oral Once per day on Sun Mon Wed Fri  . folic acid  1 mg Oral Daily  . heparin  5,000 Units Subcutaneous Q8H  . LORazepam  0-4 mg Intravenous Q12H  . midodrine  10 mg Oral TID WC  . multivitamin with minerals  1 tablet Oral  Daily  . pantoprazole  40 mg Oral Daily  . thiamine  100 mg Oral Daily   Or  . thiamine  100 mg Intravenous Daily    Continuous Infusions: . ampicillin-sulbactam (UNASYN) IV 1.5 g (11/02/18 2153)     LOS: 2 days     Kayleen Memos, MD [facility] Pager [phone]  If 7PM-7AM, please contact night-coverage [email or web address] Password [email or web address] 11/03/2018, 2:09 PM

## 2018-11-04 DIAGNOSIS — R55 Syncope and collapse: Secondary | ICD-10-CM | POA: Diagnosis not present

## 2018-11-04 LAB — BASIC METABOLIC PANEL
Anion gap: 15 (ref 5–15)
BUN: 45 mg/dL — ABNORMAL HIGH (ref 8–23)
CO2: 25 mmol/L (ref 22–32)
Calcium: 9.4 mg/dL (ref 8.9–10.3)
Chloride: 99 mmol/L (ref 98–111)
Creatinine, Ser: 5.42 mg/dL — ABNORMAL HIGH (ref 0.61–1.24)
GFR calc Af Amer: 10 mL/min — ABNORMAL LOW (ref >60)
GFR calc non Af Amer: 9 mL/min — ABNORMAL LOW (ref >60)
Glucose, Bld: 109 mg/dL — ABNORMAL HIGH (ref 70–99)
Potassium: 4.2 mmol/L (ref 3.5–5.1)
Sodium: 139 mmol/L (ref 135–145)

## 2018-11-04 LAB — CBC
HCT: 30.9 % — ABNORMAL LOW (ref 39.0–52.0)
Hemoglobin: 10 g/dL — ABNORMAL LOW (ref 13.0–17.0)
MCH: 33.7 pg (ref 26.0–34.0)
MCHC: 32.4 g/dL (ref 30.0–36.0)
MCV: 104 fL — ABNORMAL HIGH (ref 80.0–100.0)
Platelets: 161 10*3/uL (ref 150–400)
RBC: 2.97 MIL/uL — ABNORMAL LOW (ref 4.22–5.81)
RDW: 13.2 % (ref 11.5–15.5)
WBC: 8.3 10*3/uL (ref 4.0–10.5)
nRBC: 0 % (ref 0.0–0.2)

## 2018-11-04 LAB — PROCALCITONIN: Procalcitonin: 4.93 ng/mL

## 2018-11-04 MED ORDER — AMOXICILLIN-POT CLAVULANATE 250-125 MG PO TABS: 1.0000 | ORAL_TABLET | ORAL | 0 refills | Status: AC

## 2018-11-04 MED ORDER — MIDODRINE HCL 10 MG PO TABS: 10.0000 mg | ORAL_TABLET | Freq: Three times a day (TID) | ORAL | 0 refills | Status: DC

## 2018-11-04 MED ORDER — ADULT MULTIVITAMIN W/MINERALS CH: 1.0000 | ORAL_TABLET | Freq: Every day | ORAL | 0 refills | Status: DC

## 2018-11-04 MED ORDER — AMOXICILLIN-POT CLAVULANATE 250-125 MG PO TABS
1.0000 | ORAL_TABLET | ORAL | Status: DC
  Filled 2018-11-04: qty 1

## 2018-11-04 MED ORDER — THIAMINE HCL 100 MG PO TABS: 100.0000 mg | ORAL_TABLET | Freq: Every day | ORAL | 0 refills | Status: DC

## 2018-11-04 MED ORDER — FOLIC ACID 1 MG PO TABS: 1.0000 mg | ORAL_TABLET | Freq: Every day | ORAL | 0 refills | Status: DC

## 2018-11-04 MED ORDER — METOCLOPRAMIDE HCL 5 MG PO TABS: 5.0000 mg | ORAL_TABLET | Freq: Every day | ORAL | 0 refills | Status: DC | PRN

## 2018-11-04 NOTE — Discharge Summary (Addendum)
Discharge Summary  Eric [name] JTT:[id number] DOB: 1930-02-16  PCP: Gayland Curry, DO  Admit date: 10/31/2018 Discharge date: 11/04/2018  Time spent: 25 minutes  Recommendations for Outpatient Follow-up:  1. Follow-up with nephrology 2. Follow-up with your cardiologist 3. Follow up with your PCP 4. Take your medications as prescribed   Discharge Diagnoses:  Active Hospital Problems   Diagnosis Date Noted  . Syncope 10/31/2018  . Aspiration pneumonia ([place]) 11/01/2018  . Alcohol use 11/01/2018  . Abnormal LFTs 11/01/2018  . Chronic systolic CHF (congestive heart failure) ([place]) 10/31/2018  . Lobar pneumonia ([place]) 10/31/2018  . Sepsis ([place]) 10/31/2018  . HLD (hyperlipidemia) 10/31/2018  . CAD (coronary artery disease) 10/31/2018  . S/P CABG (coronary artery bypass graft) 10/10/2017  . Hypotension 10/10/2017  . Hyperkalemia 10/10/2017  . S/P AVR 12/09/2014  . ESRD on dialysis ([place]) 06/04/2013  . Gout 06/04/2013  . Essential hypertension   . GERD (gastroesophageal reflux disease)     Resolved Hospital Problems  No resolved problems to display.    Discharge Condition: Stable  Diet recommendation: Resume previous diet; renal heart healthy dialysis diet.  Vitals:   11/04/18 0729 11/04/18 0734  BP: 134/65 133/75  Pulse: 65 63  Resp: 17 18  Temp:    SpO2: 95%     History of present illness:  Eric [name] a 85 y.o.malewith medical history significant ofESRD-HD (TTS), HTN, HLD, GERD, gout, AV replacement (12/09/14), CAD, CABG, chronic sCHF with EF 20-25%, alcohol use, who presents with syncope.  Pt states that he was feeling normal this AM. He went to dialysis andcompleted his normal dialysis session. Patient reports immediately after finishing dialysis he passed out while sitting in the chair. No fall. No injury. He does not remember what happened exactly. Hestates thishashappened in the past.He hadnausea and vomited a little bit. Currently no  nausea,vomiting, diarrhea or abdominal pain.EMS reports orthostatic vitals.Pt was hypotensive with Bp 71/47 which improved to 119/60 after given 500 cc of NS in ED.patient denies chest pain, shortness of breath, cough, fever or chills. No symptoms of UTI. He has generalized weakness and fatigue, but no unilateral numbness or tingling in his extremities. No facial droop or slurred speech.  ED Course:WBC 19.8, lactic acid of 1.9, COVID-19 test negative, potassium 6.0, bicarbonate 22, creatinine 6.07, BUN 31, abnormal liver function (ALP 63, AST 115, ALT 21, total bilirubin 1.2), temperature normal, heart rate 70 to 80s, tachypnea, oxygen saturation 94% on room air. Chest x-ray showed right lower lobe patchy infiltration.   Hospital course complicated byhypotension for which midodrine was started 10 mg 3 times daily with good response.  He had a speech therapist evaluation with recommendation for regular consistency diet with thin liquids.  During this hospitalization received extra treatment of hemodialysis due to volume overload.  Tolerated well.  11/04/18: Patient was seen and examined at his bedside this morning.  No acute events overnight.  He has no new complaints.  States his cough and breathing have improved.  Reports using oxygen supplementation on hemodialysis days in the outpatient setting.  Will obtain home O2 evaluation prior to discharge.  Afebrile with no leukocytosis.  Completed 5 days of IV antibiotics, Unasyn.  Procalcitonin is trending down.  Switched to oral Augmentin 250 mg/125 mg daily x5 days.    Vital signs and labs reviewed and are stable.  On the day of discharge, the patient was hemodynamically stable.  He will need to follow-up with his primary care provider, nephrology, and cardiology posthospitalization.  Patient understands and agrees to plan. He is eager to go home.  Hospital Course:  Principal Problem:   Syncope Active Problems:   Essential hypertension    GERD (gastroesophageal reflux disease)   Gout   ESRD on dialysis (HCC)   S/P AVR   Hypotension   Hyperkalemia   S/P CABG (coronary artery bypass graft)   Chronic systolic CHF (congestive heart failure) (HCC)   Lobar pneumonia (HCC)   Sepsis (HCC)   HLD (hyperlipidemia)   CAD (coronary artery disease)   Aspiration pneumonia (HCC)   Alcohol use   Abnormal LFTs   Syncope suspect secondary to orthostatic hypotension: Clinically improved on midodrine with blood pressure maintaining MAP greater than 65.   Continue midodrine 10 mg 3 times daily  Continue fall precautions   PT assessed and had no further recommendations.  Sepsis 2/2 Right middle lobe community-acquired pneumonia:  Presented with chronic cough, leukocytosis with WBC 19.8 and elevated procalcitonin 4.92 on 11/01/18  Repeat procalcitonin 5.92 on 11/02/2028 Procalcitonin trending down 4.93 on 11/04/2018. Afebrile with no leukocytosis Completed 5 days of IV antibiotics Unasyn Switched to Augmentin 250 mg / 125 mg daily x5 days Blood cultures taken peripherally x2 - to date Speech therapist evaluated and recommended regular consistency diet with thin liquids. Obtain home O2 evaluation for DC planning.  Resolved loose stool, possibly antibiotics induced. At the time of this visit denies recurrence of loose stools.   Afebrile with no leukocytosis.  ESRD on HD TTS Completed HD 11/02/2018 HD planned on 11/03/2018 Plan to resume regular schedule HD on 11/04/2018. Keep hemodialysis appointments.  Resolving acute blood loss/anemia of chronic disease, suspect dilutional in the setting of volume overload in ESRD Drop in hemoglobin 9.6 on 11/02/2018 from 12.9 on 10/31/2018,  Hemoglobin stable and improving 10.0 on 11/04/2018 from 9.4 on 11/03/2018. Follow-up with your nephrologist, keep your hemodialysis appointments  Resolved hypotension with midodrine with history of of essential hypertension Pt was hypotensive  with Bp 71/47 which improved to 119/60 after given 500 cc of NS in ED. This is likely due to fluid removal from HD. Sepsis is also possible Continue midodrine 10 mg tid Has responded well to midodrine. Follow-up with your PCP  GERD (gastroesophageal reflux disease): -Continue protonix  Gout: -Continue allopurinol  Hyperkalemia: Treatment per nephrology with hemodialysis  CAD:s/p of CABG (coronary artery bypass graft). Denies anginal symptoms at the time of this visit Continue lipitor  Chronic systolic CHF (congestive heart failure) (HCC)/ s/p AVR, CAD s/p CABG: 2D echo on 05/19/2018 showed EF 20-25%.  Volume status management per nephrologist Euvolemic on exam at the time of this visit Follow-up with your cardiologist  HLD (hyperlipidemia): -Continue Lipitor  Alcohol use: Completed CiWA  Continue Vitamin B1 and folic acid  Resolved abnormal LFTs: ratio of AST/ALT (115/21) is typical for alcohol use. Repeated LFTs on 10/13/2018 within normal range  Hiccups Started Reglan daily as needed x4 days Follow-up with PCP     Code Status:Partial code.  Patient wants to be partial code, OK for CPR, but no intubation).   Consults called: Nephrology.   Discharge Exam: BP 133/75   Pulse 63   Temp 97.9 F (36.6 C) (Oral)   Resp 18   Ht 5\' 9"  (1.753 m)   Wt (P) 74.5 kg   SpO2 95%   BMI (P) 24.25 kg/m  . General: 85 y.o. year-old male well developed well nourished in no acute distress.  Alert and oriented x4. . Cardiovascular: Regular rate and rhythm  with no rubs or gallops.  No thyromegaly or JVD noted.   Marland Kitchen Respiratory: Clear to auscultation with no wheezes or rales. Good inspiratory effort. . Abdomen: Soft nontender nondistended with normal bowel sounds x4 quadrants. . Musculoskeletal: No lower extremity edema. 2/4 pulses in all 4 extremities. Marland Kitchen Psychiatry: Mood is appropriate for condition and setting  Discharge Instructions You were cared for by a  hospitalist during your hospital stay. If you have any questions about your discharge medications or the care you received while you were in the hospital after you are discharged, you can call the unit and asked to speak with the hospitalist on call if the hospitalist that took care of you is not available. Once you are discharged, your primary care physician will handle any further medical issues. Please note that NO REFILLS for any discharge medications will be authorized once you are discharged, as it is imperative that you return to your primary care physician (or establish a relationship with a primary care physician if you do not have one) for your aftercare needs so that they can reassess your need for medications and monitor your lab values.   Allergies as of 11/04/2018   No Known Allergies     Medication List    STOP taking these medications   carvedilol 3.125 MG tablet Commonly known as: COREG     TAKE these medications   allopurinol 100 MG tablet Commonly known as: ZYLOPRIM TAKE 1 TABLET BY MOUTH DAILY   amoxicillin-clavulanate 250-125 MG tablet Commonly known as: AUGMENTIN Take 1 tablet by mouth daily for 5 days.   atorvastatin 20 MG tablet Commonly known as: LIPITOR TAKE 1 TABLET BY MOUTH DAILY FOR CHOLESTEROL What changed: See the new instructions.   calcium carbonate 500 MG chewable tablet Commonly known as: TUMS - dosed in mg elemental calcium Chew 2 tablets by mouth 3 (three) times daily with meals.   cinacalcet 60 MG tablet Commonly known as: SENSIPAR Take 60 mg by mouth. MondayWednesday, Friday, Sunday   folic acid 1 MG tablet Commonly known as: FOLVITE Take 1 tablet (1 mg total) by mouth daily.   metoCLOPramide 5 MG tablet Commonly known as: REGLAN Take 1 tablet (5 mg total) by mouth daily as needed (hiccups).   midodrine 10 MG tablet Commonly known as: PROAMATINE Take 1 tablet (10 mg total) by mouth 3 (three) times daily with meals.   multivitamin  with minerals Tabs tablet Take 1 tablet by mouth daily.   omeprazole 20 MG capsule Commonly known as: PRILOSEC Take 20 mg by mouth daily.   thiamine 100 MG tablet Take 1 tablet (100 mg total) by mouth daily.      No Known Allergies Follow-up Information    Reed, Tiffany L, DO. Call in 1 day(s).   Specialty: Geriatric Medicine Why: Please call for a post hospital follow-up appointment. Contact information: [place]. [place] [address] [phone]        Deboraha Sprang, MD. Call in 1 day(s).   Specialty: Cardiology Why: Please call for a post hospital follow-up appointment. Contact information: [address] [place] [address] [phone]            The results of significant diagnostics from this hospitalization (including imaging, microbiology, ancillary and laboratory) are listed below for reference.    Significant Diagnostic Studies: Dg Chest 2 View  Result Date: 11/02/2018 CLINICAL DATA:  Volume overload. EXAM: CHEST - 2 VIEW COMPARISON:  10/31/2018. FINDINGS: Prior cardiac  valve replacement. Cardiomegaly. No pulmonary venous congestion. Persistent mild right base infiltrate. Persistent mild bibasilar atelectasis. No prominent pleural effusion. No pneumothorax. IMPRESSION: 1. Persistent mild right base infiltrate. Persistent mild bibasilar atelectasis. 2. Prior cardiac valve replacement. Stable cardiomegaly. No pulmonary venous congestion. Electronically Signed   By: Marcello Moores  Register   On: 11/02/2018 12:33   Dg Chest 2 View  Result Date: 10/31/2018 CLINICAL DATA:  Fatigue following dialysis today EXAM: CHEST - 2 VIEW COMPARISON:  10/10/2017 FINDINGS: Cardiac shadow remains enlarged. Postsurgical changes are again seen. Aortic calcifications are noted. The lungs are well aerated bilaterally. Patchy parenchymal opacity is noted in the right lower lobe consistent with acute pneumonia. No bony abnormality is seen. IMPRESSION: Changes  consistent with acute right lower lobe pneumonia. Followup PA and lateral chest X-ray is recommended in 3-4 weeks following trial of antibiotic therapy to ensure resolution and exclude underlying malignancy. Electronically Signed   By: Inez Catalina M.D.   On: 10/31/2018 21:24    Microbiology: Recent Results (from the past 240 hour(s))  Culture, blood (routine x 2)     Status: None (Preliminary result)   Collection Time: 10/31/18  9:47 PM   Specimen: BLOOD  Result Value Ref Range Status   Specimen Description BLOOD BLOOD LEFT FOREARM  Final   Special Requests   Final    BOTTLES DRAWN AEROBIC AND ANAEROBIC Blood Culture adequate volume   Culture   Final    NO GROWTH 3 DAYS Performed at [facility], [place] [address], [place], [place] [address]    Report Status PENDING  Incomplete  Culture, blood (routine x 2)     Status: None (Preliminary result)   Collection Time: 10/31/18  9:54 PM   Specimen: BLOOD RIGHT HAND  Result Value Ref Range Status   Specimen Description BLOOD RIGHT HAND  Final   Special Requests   Final    BOTTLES DRAWN AEROBIC AND ANAEROBIC Blood Culture adequate volume   Culture   Final    NO GROWTH 3 DAYS Performed at [facility], [place] [address], [place], [place] [address]    Report Status PENDING  Incomplete  SARS CORONAVIRUS 2 (TAT 6-24 HRS) Nasopharyngeal Nasopharyngeal Swab     Status: None   Collection Time: 10/31/18 11:46 PM   Specimen: Nasopharyngeal Swab  Result Value Ref Range Status   SARS Coronavirus 2 NEGATIVE NEGATIVE Final    Comment: (NOTE) SARS-CoV-2 target nucleic acids are NOT DETECTED. The SARS-CoV-2 RNA is generally detectable in upper and lower respiratory specimens during the acute phase of infection. Negative results do not preclude SARS-CoV-2 infection, do not rule out co-infections with other pathogens, and should not be used as the sole basis for treatment or other patient management decisions. Negative results must be combined  with clinical observations, patient history, and epidemiological information. The expected result is Negative. Fact Sheet for Patients: SugarRoll.be Fact Sheet for Healthcare Providers: https://www.woods-mathews.com/ This test is not yet approved or cleared by the [place] FDA and  has been authorized for detection and/or diagnosis of SARS-CoV-2 by FDA under an Emergency Use Authorization (EUA). This EUA will remain  in effect (meaning this test can be used) for the duration of the COVID-19 declaration under Section 56 4(b)(1) of the Act, 21 U.S.C. section 360bbb-3(b)(1), unless the authorization is terminated or revoked sooner. Performed at [facility], [place] [address] [place],  [address]   Urine culture     Status: Abnormal   Collection  Time: 11/01/18 11:22 AM   Specimen: Urine, Clean Catch  Result Value Ref Range Status   Specimen Description URINE, CLEAN CATCH  Final   Special Requests   Final    NONE Performed at [facility], [address] [place], [place] [address]    Culture 20,000 COLONIES/mL YEAST (A)  Final   Report Status 11/02/2018 FINAL  Final     Labs: Basic Metabolic Panel: Recent Labs  Lab 10/31/18 2126 11/01/18 0110 11/02/18 0536 11/02/18 0639 11/03/18 0427 11/04/18 0609  NA 135 135 137  --  138 139  K 6.0* 4.4 5.2*  --  4.3 4.2  CL 96* 95* 104  --  100 99  CO2 22 24 21*  --  25 25  GLUCOSE 107* 285* 106*  --  91 109*  BUN 31* 33* 57*  --  39* 45*  CREATININE 6.07* 6.30* 8.28*  --  5.87* 5.42*  CALCIUM 9.5 8.8* 9.0  --  8.8* 9.4  PHOS  --   --   --  4.0  --   --    Liver Function Tests: Recent Labs  Lab 10/31/18 2126 11/02/18 0536  AST 115* 40  ALT 21 16  ALKPHOS 63 46  BILITOT 1.2 0.6  PROT 7.7 6.0*  ALBUMIN 3.6 2.7*   Recent Labs  Lab 10/31/18 2126  LIPASE 34   No results for input(s): AMMONIA in the last 168 hours. CBC: Recent Labs  Lab 10/31/18 2126 11/02/18  0536 11/03/18 0427 11/04/18 0609  WBC 19.8* 11.4* 8.5 8.3  NEUTROABS 17.8*  --   --   --   HGB 12.9* 9.6* 9.4* 10.0*  HCT 38.5* 29.0* 28.3* 30.9*  MCV 101.6* 103.9* 102.2* 104.0*  PLT 169 140* 134* 161   Cardiac Enzymes: No results for input(s): CKTOTAL, CKMB, CKMBINDEX, TROPONINI in the last 168 hours. BNP: BNP (last 3 results) No results for input(s): BNP in the last 8760 hours.  ProBNP (last 3 results) No results for input(s): PROBNP in the last 8760 hours.  CBG: Recent Labs  Lab 11/01/18 0222 11/01/18 0525 11/01/18 0558 11/01/18 0935 11/01/18 1545  GLUCAP 233* 69* 95 152* 103*       Signed:  Kayleen Memos, MD [facility] 11/04/2018, 8:43 AM

## 2018-11-04 NOTE — Progress Notes (Addendum)
[facility] Progress Note   Subjective:   Patient seen on HD. Mild hypotension during treatment with systolic BP in the 42'H, goal reduced to 2L. Still with some edema in hands. Denies SOB, dyspnea, orthopnea, CP, palpitations, abdominal pain, N/V/D. Discussed fluid/salt restrictions.   Objective Vitals:   11/04/18 0500 11/04/18 0715 11/04/18 0729 11/04/18 0734  BP: (!) 149/63  134/65 133/75  Pulse: 68  65 63  Resp: 18  17 18   Temp: 97.9 F (36.6 C)     TempSrc: Oral     SpO2: 91%  95%   Weight:  (P) 74.5 kg    Height:       Physical Exam General: Well developed, well nourished male. Alert and in NAD Heart: RRR, no murmurs, rubs or gallops Lungs: Unlabored on RA. Lungs CTA anteriorly. O2 sat 98%. Abdomen: Soft, non-tender, non-distended. +BS Extremities: trace edema bilateral hands. No edema b/l lower extremities Dialysis Access: RUE AVF currently accessed  Additional Objective Labs: Basic Metabolic Panel: Recent Labs  Lab 11/02/18 0536 11/02/18 0639 11/03/18 0427 11/04/18 0609  NA 137  --  138 139  K 5.2*  --  4.3 4.2  CL 104  --  100 99  CO2 21*  --  25 25  GLUCOSE 106*  --  91 109*  BUN 57*  --  39* 45*  CREATININE 8.28*  --  5.87* 5.42*  CALCIUM 9.0  --  8.8* 9.4  PHOS  --  4.0  --   --    Liver Function Tests: Recent Labs  Lab 10/31/18 2126 11/02/18 0536  AST 115* 40  ALT 21 16  ALKPHOS 63 46  BILITOT 1.2 0.6  PROT 7.7 6.0*  ALBUMIN 3.6 2.7*   Recent Labs  Lab 10/31/18 2126  LIPASE 34   CBC: Recent Labs  Lab 10/31/18 2126 11/02/18 0536 11/03/18 0427 11/04/18 0609  WBC 19.8* 11.4* 8.5 8.3  NEUTROABS 17.8*  --   --   --   HGB 12.9* 9.6* 9.4* 10.0*  HCT 38.5* 29.0* 28.3* 30.9*  MCV 101.6* 103.9* 102.2* 104.0*  PLT 169 140* 134* 161   Blood Culture    Component Value Date/Time   SDES URINE, CLEAN CATCH 11/01/2018 1122   SPECREQUEST  11/01/2018 1122    NONE Performed at [facility], [place] [address],  [place], [place] [address]    CULT 20,000 COLONIES/mL YEAST (A) 11/01/2018 1122   REPTSTATUS 11/02/2018 FINAL 11/01/2018 1122    CBG: Recent Labs  Lab 11/01/18 0222 11/01/18 0525 11/01/18 0558 11/01/18 0935 11/01/18 1545  GLUCAP 233* 69* 95 152* 103*    Studies/Results: Dg Chest 2 View  Result Date: 11/02/2018 CLINICAL DATA:  Volume overload. EXAM: CHEST - 2 VIEW COMPARISON:  10/31/2018. FINDINGS: Prior cardiac valve replacement. Cardiomegaly. No pulmonary venous congestion. Persistent mild right base infiltrate. Persistent mild bibasilar atelectasis. No prominent pleural effusion. No pneumothorax. IMPRESSION: 1. Persistent mild right base infiltrate. Persistent mild bibasilar atelectasis. 2. Prior cardiac valve replacement. Stable cardiomegaly. No pulmonary venous congestion. Electronically Signed   By: Marcello Moores  Register   On: 11/02/2018 12:33   Medications:  . allopurinol  100 mg Oral Daily  . amoxicillin-clavulanate  1 tablet Oral Q24H  . atorvastatin  20 mg Oral Daily  . calcium carbonate  2 tablet Oral TID WC  . Chlorhexidine Gluconate Cloth  6 each Topical Q0600  . cinacalcet  60 mg Oral Once per day on Sun Mon Wed Fri  . folic  acid  1 mg Oral Daily  . heparin  5,000 Units Subcutaneous Q8H  . LORazepam  0-4 mg Intravenous Q12H  . midodrine  10 mg Oral TID WC  . multivitamin with minerals  1 tablet Oral Daily  . pantoprazole  40 mg Oral Daily  . thiamine  100 mg Oral Daily   Or  . thiamine  100 mg Intravenous Daily    Dialysis Orders: TTS -NW 4hrs, BFR400, RFF638, [id number],5T/0VX Linear Na, Profile 4  Access:RU AVF Heparinnone Venofer 50mg  qHD  Assessment/Plan: 1. Syncope - likely related to hypotension at dialysis. Patient does not have large gains but was unable to reach dry weight recently due to hypotension. Midodrine started, BP stable. 2. Possible PNA - On ABX. Per primary.WBC trending down. Completed course of unasyn, now on augmentin. Speech  therapy evaluated for possible aspiration and recommended regular diet. Denies any SOB or chills today.  3. ESRD - On HD TTS. K4.2. Short extra treatment yesterday, now back on regular TTS schedule.  4. Hypotension/volume - BP soft with syncope prior to admission,midodrine started and BP has improved. Hx CM EF 20-25%.  InitialCXR suspicious for  Increased volume, repeat CXR 10/29 improved. BP in the 79'T systolic with HD today but nausea had resolved. Still has some edema in hands but LE edema resolved. Discussed importance of fluid and salt restriction and patient does not tolerate high UF.  5. Anemia of CKD - Hgb 10.0, trending back up. Will start low dose ESA at outpatient unit.  6. Secondary Hyperparathyroidism - Corrected calcium 10.4, phos within goal. Not on binder or VDRA. Use low calcium bath with HD. Continue senspiar.  7. Nutrition - Renal diet w/fluid restriction, reviewed with patient today.  8. CHF EF 20-25% 9. Hx CABG, AVR   Anice Paganini, PA-C 11/04/2018, 8:45 AM  [facility] Pager: [phone]  Pt seen, examined and agree w A/P as above. We have not been able to drop his body wt much due to symptoms on dialysis ( in spite of starting midodrine) and fluid indiscretion. Breathing and exam is better though.  OK for dc after HD today.  Kelly Splinter  MD 11/04/2018, 11:02 AM

## 2018-11-04 NOTE — Progress Notes (Signed)
DISCHARGE NOTE HOME Eric [name] to be discharged to independent living on [address]  per MD order. Discussed prescriptions and follow up appointments with the patient. Prescriptions given to patient; medication list explained in detail. Patient verbalized understanding.  Skin clean, dry and intact without evidence of skin break down, no evidence of skin tears noted. IV catheter discontinued intact. Site without signs and symptoms of complications. Dressing and pressure applied. Pt denies pain at the site currently. No complaints noted.  Patient free of lines, drains, and wounds.   An After Visit Summary (AVS) was printed and given to the patient. Patient escorted via wheelchair, and discharged home via private auto-Patient's daughter.  Granjeno, Zenon Mayo, RN

## 2018-11-04 NOTE — Progress Notes (Signed)
SATURATION QUALIFICATIONS: (This note is used to comply with regulatory documentation for home oxygen)  Patient Saturations on Room Air at Rest = 94 %  Patient Saturations on Room Air while Ambulating = 93%  Patient Saturations on  n/aLiters of oxygen while Ambulating =   N /A %  Please briefly explain why patient needs home oxygen:

## 2018-11-04 NOTE — Discharge Instructions (Signed)
Hemodialysis Dialysis is a procedure that replaces some of the work that healthy kidneys do. During dialysis, wastes, salt, and extra water are removed from the blood, and the level of certain important minerals in the blood (such as potassium) is maintained. It is typically started when you have lost about 85-90% of your kidney function, or earlier if it may help relieve your symptoms. Hemodialysis is a type of dialysis in which a machine called a dialyzer is used to filter the blood. Hemodialysis is usually performed by a health care provider at a hospital or dialysis center 3 times a week for 3-5 hours during each visit. It may also be performed on a different schedule at home with training. Tell a health care provider about:  Any allergies you have.  All medicines you are taking, including vitamins, herbs, eye drops, creams, and over-the-counter medicines.  Any blood disorders you have.  Any medical conditions you have. What are the risks? Generally, this is a safe procedure. However, problems may occur, including:  Low blood pressure (hypotension).  Narrowing or ballooning of a blood vessel, or bleeding at the site where blood is removed from the body and returned to the body (vascular access).  An infection or blockage at the vascular access site.  Allergic reaction to chemicals used to sterilize the dialyzer. What happens before the procedure?        Before having hemodialysis for the first time, you will need surgery or a procedure to create a vascular access site. There are three types of vascular access:  Arteriovenous fistula. This type of access is created when an artery and a vein (usually in the arm) are connected by a surgical procedure. The arteriovenous fistula usually takes 1-6 months to develop after surgery.  Arteriovenous graft. This type of access is created when a tube is used to connect an artery and a vein in the arm. An arteriovenous graft can usually be used  within 2-3 weeks of surgery.  A venous catheter. To create this type of access, a thin, flexible tube (catheter) is placed in a large vein in your neck, chest, or groin. A venous catheter can be used right away. What happens during the procedure?   Your weight, blood pressure, pulse, and temperature will be measured.  The skin around your vascular access will be cleaned (sterilized) to get rid of germs.  Your vascular access will be connected to the dialyzer. ? If your access is a fistula or graft, two needles will be inserted through the vascular access. The needles will be connected to a plastic tube that is connected to the dialyzer. They will be taped to your skin so that they do not move out of place. ? If your access is a catheter, it will be connected to a plastic tube that is then connected to the dialyzer.  The dialysis machine will be turned on. This will cause your blood to flow to the dialyzer. The dialyzer will filter and clean your blood before returning it to your body. While the dialysis machine is working, your blood pressure and pulse will be checked several times.  Once dialysis is complete, your vascular access will be disconnected from the dialyzer. If your access is a fistula or graft, the needles will be removed and a bandage (dressing) will be placed over the access. Hemodialysis is performed while you are sitting or reclining. During the procedure, you may sleep, read, watch television, or perform any other tasks that can be done  while you are in this position. If you experience side effects or any other discomfort during the procedure, let your health care provider know. He or she may be able to make adjustments to help you feel more comfortable. The procedure may vary among health care providers and hospitals. What happens after the procedure?  Your weight will be measured.  Your blood may be tested to see whether your treatments are removing enough wastes. This is  usually done once a month.  You may experience or continue to experience side effects, including: ? Dizziness. ? Muscle cramps. ? Nausea. ? Headaches. ? Allergic reaction to the chemicals used to sterilize the dialyzer. Summary  Dialysis is a procedure that replaces some of the work that healthy kidneys do. During dialysis, wastes, salt, and extra water are removed from the blood, and the level of certain important minerals in the blood is maintained.  Hemodialysis is a type of dialysis in which a machine called a dialyzer is used to filter the blood. It is usually performed by a health care provider at a hospital or dialysis center 3 times a week for 3-5 hours during each visit.  Before having hemodialysis for the first time, you will need surgery or a procedure to create a vascular access.  During hemodialysis, your vascular access will be connected to the dialyzer. The dialyzer will filter and clean your blood before returning it to your body. This information is not intended to replace advice given to you by your health care provider. Make sure you discuss any questions you have with your health care provider. Document Released: 04/17/2012 Document Revised: 12/03/2016 Document Reviewed: 01/27/2016 Elsevier Patient Education  2020 [employer] Pneumonia, Adult Pneumonia is an infection of the lungs. It causes swelling in the airways of the lungs. Mucus and fluid may also build up inside the airways. One type of pneumonia can happen while a person is in a hospital. A different type can happen when a person is not in a hospital (community-acquired pneumonia).  What are the causes?  This condition is caused by germs (viruses, bacteria, or fungi). Some types of germs can be passed from one person to another. This can happen when you breathe in droplets from the cough or sneeze of an infected person. What increases the risk? You are more likely to develop this condition  if you:  Have a long-term (chronic) disease, such as: ? Chronic obstructive pulmonary disease (COPD). ? Asthma. ? Cystic fibrosis. ? Congestive heart failure. ? Diabetes. ? Kidney disease.  Have HIV.  Have sickle cell disease.  Have had your spleen removed.  Do not take good care of your teeth and mouth (poor dental hygiene).  Have a medical condition that increases the risk of breathing in droplets from your own mouth and nose.  Have a weakened body defense system (immune system).  Are a smoker.  Travel to areas where the germs that cause this illness are common.  Are around certain animals or the places they live. What are the signs or symptoms?  A dry cough.  A wet (productive) cough.  Fever.  Sweating.  Chest pain. This often happens when breathing deeply or coughing.  Fast breathing or trouble breathing.  Shortness of breath.  Shaking chills.  Feeling tired (fatigue).  Muscle aches. How is this treated? Treatment for this condition depends on many things. Most adults can be treated at home. In some cases, treatment must happen in a hospital. Treatment may include:  Medicines given by mouth or through an IV tube.  Being given extra oxygen.  Respiratory therapy. In rare cases, treatment for very bad pneumonia may include:  Using a machine to help you breathe.  Having a procedure to remove fluid from around your lungs. Follow these instructions at home: Medicines  Take over-the-counter and prescription medicines only as told by your doctor. ? Only take cough medicine if you are losing sleep.  If you were prescribed an antibiotic medicine, take it as told by your doctor. Do not stop taking the antibiotic even if you start to feel better. General instructions   Sleep with your head and neck raised (elevated). You can do this by sleeping in a recliner or by putting a few pillows under your head.  Rest as needed. Get at least 8 hours of sleep  each night.  Drink enough water to keep your pee (urine) pale yellow.  Eat a healthy diet that includes plenty of vegetables, fruits, whole grains, low-fat dairy products, and lean protein.  Do not use any products that contain nicotine or tobacco. These include cigarettes, e-cigarettes, and chewing tobacco. If you need help quitting, ask your doctor.  Keep all follow-up visits as told by your doctor. This is important. How is this prevented? A shot (vaccine) can help prevent pneumonia. Shots are often suggested for:  People older than 83 years of age.  People older than 83 years of age who: ? Are having cancer treatment. ? Have long-term (chronic) lung disease. ? Have problems with their body's defense system. You may also prevent pneumonia if you take these actions:  Get the flu (influenza) shot every year.  Go to the dentist as often as told.  Wash your hands often. If you cannot use soap and water, use hand sanitizer. Contact a doctor if:  You have a fever.  You lose sleep because your cough medicine does not help. Get help right away if:  You are short of breath and it gets worse.  You have more chest pain.  Your sickness gets worse. This is very serious if: ? You are an older adult. ? Your body's defense system is weak.  You cough up blood. Summary  Pneumonia is an infection of the lungs.  Most adults can be treated at home. Some will need treatment in a hospital.  Drink enough water to keep your pee pale yellow.  Get at least 8 hours of sleep each night. This information is not intended to replace advice given to you by your health care provider. Make sure you discuss any questions you have with your health care provider. Document Released: 06/09/2007 Document Revised: 04/12/2018 Document Reviewed: 08/18/2017 Elsevier Patient Education  2020 [employer].

## 2018-11-05 ENCOUNTER — Telehealth: Payer: Self-pay | Admitting: Physician Assistant

## 2018-11-05 DIAGNOSIS — N2889 Other specified disorders of kidney and ureter: Secondary | ICD-10-CM | POA: Diagnosis not present

## 2018-11-05 DIAGNOSIS — N186 End stage renal disease: Secondary | ICD-10-CM | POA: Diagnosis not present

## 2018-11-05 DIAGNOSIS — Z992 Dependence on renal dialysis: Secondary | ICD-10-CM | POA: Diagnosis not present

## 2018-11-05 LAB — CULTURE, BLOOD (ROUTINE X 2)
Culture: NO GROWTH
Culture: NO GROWTH
Special Requests: ADEQUATE
Special Requests: ADEQUATE

## 2018-11-05 NOTE — Telephone Encounter (Signed)
Transition of care contact from inpatient facility  Date of Discharge: 11/04/2018 Date of contact: 11/05/2018 Method of contact: phone  Attempted to contact patient to discuss transition of care from inpatient admission. Patient did not answer the phone. Message was left on the patient's voicemail with call back number.

## 2018-11-06 ENCOUNTER — Telehealth: Payer: Self-pay | Admitting: *Deleted

## 2018-11-06 ENCOUNTER — Telehealth: Payer: Self-pay | Admitting: Physician Assistant

## 2018-11-06 NOTE — Telephone Encounter (Signed)
Per Dr. Reed---nevermind about TOC, but he should have a f/u with me anyway b/c the discharge wants me to do some stuff.    Appointment scheduled for Wednesday

## 2018-11-06 NOTE — Telephone Encounter (Signed)
Transition of care contact from inpatient facility  Date of discharge: 11/04/2018 Date of contact: 11/06/2018 Method of contact: Phone  Patient contacted to discuss transition of care from recent inpatient hospitalization. Patient was admitted to [facility] from 10/31/18-11/04/18 with a diagnosis of syncope and right middle lobe CAP. Medication changes were reviewed. Patient will follow up with his outpatient dialysis center on 11/07/2018. Reports he has follow up appointments with PCP and cardiology.

## 2018-11-06 NOTE — Telephone Encounter (Signed)
-----   Message from Gayland Curry, DO sent at 11/06/2018  5:54 AM EST ----- Actually, oddly, we can't see a thing from nephrology but when they want to bill for TOC, the phone call shows up!  Gotta love it.  So, nevermind about TOC, but he should have a f/u with me anyway b/c the discharge wants me to do some stuff.

## 2018-11-07 DIAGNOSIS — D509 Iron deficiency anemia, unspecified: Secondary | ICD-10-CM | POA: Diagnosis not present

## 2018-11-07 DIAGNOSIS — Z992 Dependence on renal dialysis: Secondary | ICD-10-CM | POA: Diagnosis not present

## 2018-11-07 DIAGNOSIS — D631 Anemia in chronic kidney disease: Secondary | ICD-10-CM | POA: Diagnosis not present

## 2018-11-07 DIAGNOSIS — N186 End stage renal disease: Secondary | ICD-10-CM | POA: Diagnosis not present

## 2018-11-07 DIAGNOSIS — N2581 Secondary hyperparathyroidism of renal origin: Secondary | ICD-10-CM | POA: Diagnosis not present

## 2018-11-07 DIAGNOSIS — N184 Chronic kidney disease, stage 4 (severe): Secondary | ICD-10-CM | POA: Diagnosis not present

## 2018-11-07 DIAGNOSIS — R55 Syncope and collapse: Secondary | ICD-10-CM | POA: Diagnosis not present

## 2018-11-07 DIAGNOSIS — J1289 Other viral pneumonia: Secondary | ICD-10-CM | POA: Diagnosis not present

## 2018-11-08 ENCOUNTER — Non-Acute Institutional Stay: Payer: Medicare Other | Admitting: Internal Medicine

## 2018-11-08 ENCOUNTER — Other Ambulatory Visit: Payer: Self-pay

## 2018-11-08 ENCOUNTER — Encounter: Payer: Self-pay | Admitting: Internal Medicine

## 2018-11-08 VITALS — BP 118/66 | HR 52 | Temp 97.6°F | Ht 70.0 in | Wt 169.0 lb

## 2018-11-08 DIAGNOSIS — Z992 Dependence on renal dialysis: Secondary | ICD-10-CM

## 2018-11-08 DIAGNOSIS — Z7189 Other specified counseling: Secondary | ICD-10-CM | POA: Diagnosis not present

## 2018-11-08 DIAGNOSIS — E861 Hypovolemia: Secondary | ICD-10-CM | POA: Diagnosis not present

## 2018-11-08 DIAGNOSIS — I255 Ischemic cardiomyopathy: Secondary | ICD-10-CM

## 2018-11-08 DIAGNOSIS — I5020 Unspecified systolic (congestive) heart failure: Secondary | ICD-10-CM | POA: Diagnosis not present

## 2018-11-08 DIAGNOSIS — J690 Pneumonitis due to inhalation of food and vomit: Secondary | ICD-10-CM | POA: Diagnosis not present

## 2018-11-08 DIAGNOSIS — R066 Hiccough: Secondary | ICD-10-CM

## 2018-11-08 DIAGNOSIS — I9589 Other hypotension: Secondary | ICD-10-CM | POA: Diagnosis not present

## 2018-11-08 DIAGNOSIS — R55 Syncope and collapse: Secondary | ICD-10-CM | POA: Diagnosis not present

## 2018-11-08 DIAGNOSIS — N186 End stage renal disease: Secondary | ICD-10-CM | POA: Diagnosis not present

## 2018-11-08 DIAGNOSIS — Z7289 Other problems related to lifestyle: Secondary | ICD-10-CM

## 2018-11-08 DIAGNOSIS — F1090 Alcohol use, unspecified, uncomplicated: Secondary | ICD-10-CM

## 2018-11-08 DIAGNOSIS — Z789 Other specified health status: Secondary | ICD-10-CM

## 2018-11-08 DIAGNOSIS — I25110 Atherosclerotic heart disease of native coronary artery with unstable angina pectoris: Secondary | ICD-10-CM | POA: Diagnosis not present

## 2018-11-08 MED ORDER — METOCLOPRAMIDE HCL 5 MG PO TABS: 5.0000 mg | ORAL_TABLET | Freq: Three times a day (TID) | ORAL | 0 refills | Status: DC | PRN

## 2018-11-08 NOTE — Progress Notes (Signed)
Location:  [facility] clinic Provider: Nic Lampe L. Mariea Clonts, D.O., C.M.D.  Code Status: partial code--CPR but no intubation--was requested at the hospital.  We discussed this in detail today. He would not want CPR if he would end up in a coma which could certainly occur.  He had CPR and survived 5 years ago.  We discussed that his health status was much different then--that was when he was in [place] before moving to [place].  He's been on dialysis since then, and has had a heart attack with CABG, AVR, and his EF has dropped considerably.  He's using oxygen at times when fatigued or dyspneic.  Finding a fluid balance with his renal and cardiac concerns is becoming much more challenging and he's not requiring midodrine to keep his bp up.  He is going to think about the CPR idea some more and let me know if he wants the DNR form completed.  We are also concerned that his wife will panic and not recall his decision.  The paperwork needs to be available for EMS--I explained this to him, as well.  Goals of Care:  Advanced Directives 11/01/2018  Does Patient Have a Medical Advance Directive? -  Type of Advance Directive -  Does patient want to make changes to medical advance directive? -  Copy of [place] in Chart? -  Would patient like information on creating a medical advance directive? No - Patient declined  Pre-existing out of facility DNR order (yellow form or pink MOST form) -     Chief Complaint  Patient presents with   Hospitalization Follow-up    Patient following up from hospital 10/27-10/31 for syncope. Patient still with hiccups    Immunizations    Flu vaccine up to date     HPI: Patient is a 85 y.o. male with h/o ESRD on HD Tues/thurs/sat, htn, hyperlipidemia, CAD s/p CABG, aortic valve replacement 81/0/17, systolic chf with EF 51-02%, alcohol use seen today for hospital follow-up s/p admission from 10/27-10/31/20 for a syncopal episode after dialysis with  nausea and vomiting.  [facility] notes reviewed from pre-hospitalization:  "Resident called at 5:44PM 10/27 to request home visit due to he's not feeling well after his dialysis today. Resident is lying on a sofa, alert, and oriented x 3 but c/o feeling tired. He said that he passed out while having dialysis this morning. Resident looks dehydrated. VS: T-97.3, P-88, R-20, BP-94/56 (lying), 80/50 (sitting), 70/48 (standing). Resident and wife were informed by RN that he needs to go to the hospital so EMS was called. Resident was undecisive whether to go to the hospital despite recommendation from medical staff that he needs to go. Resident has low-grade fever when temp was rechecked - 99.7/oral. Resident finally decided to go. Daughter Kallie Locks was called to inform about his transfer to [facility]. Wife appeared to be at a lost and does not know what to do. RN spoke to her daughter and recommends that resident stays with her tonight. Son-in-law will come to help pack her things and to pick her up. Security was made aware and will assist son-in-law when he arrives."  At the hospital, He was hypotensive at 71/47 that improved to 119/60 after a fluid bolus.  He was generally weak.      In the ED, he had WBC 19,8 and lactic acid 1.9.  Potassium 6 and elevated transaminases.  He was tachypneic but sats wnl at 94%.  He was admitted with aspiration pneumonia and treated with  rocephin and zithromax in the ED.  He was felt to be septic due to lactic acid trending up.  He was started on midodrine for hypotension. His hyperkalemia was treated with lokelma and D50 and novolog.  He was given B1 and folate due to his alcohol use and elevated transaminases.  He requested partial code status--ok for CPR but no intubation.  His abx were switched to unasyn IV for 5 days, then to augmentin for 5 more days.  Blood cultures remained negative. Midodrine was continued at 10mg  po tid for hypotension.  He got HD at the hospital on 10/29 and 10/30 and  then regular HD planned for 10/31.  His anemia remained stable at 9.6 hgb.  10/29 liver panel improved to normal.    He continues with a slight cough.  He is not bringing up sputum.  He still has the hiccups and I could not get a straight story from him or his wife about whether the reglan helped his hiccups for sure or not and if they'd stopped since they began while he was in the hospital.  He did agree to pick up some more to use (after he hiccuped through today's visit).     Past Medical History:  Diagnosis Date   Anxiety    Arthritis    Complication of anesthesia    " one time my heart stopped due to a medication that I was on."    ESRD (end stage renal disease) on dialysis ([place])    Tues, Thursday, Saturday; [facility]; [facility] (10/10/2017)   GERD (gastroesophageal reflux disease)    Gout    Heart murmur    as a teen   Herpes zoster 04/2012   Hypertension    Pneumonia 10/2018   Shortness of breath dyspnea    with exertion    Past Surgical History:  Procedure Laterality Date   AORTIC VALVE REPLACEMENT N/A 12/09/2014   Procedure: AORTIC VALVE REPLACEMENT (AVR) WITH 23MM MAGNA EASE BIOPROSTHETIC VALVE;  Surgeon: Gaye Pollack, MD;  Location: [facility];  Service: Open Heart Surgery;  Laterality: N/A;   APPENDECTOMY  1944   AV FISTULA PLACEMENT Left    Left Cimino AVF placed in [place] Right 03/06/2014   Procedure: ARTERIOVENOUS (AV) FISTULA CREATION-right radiocephalic;  Surgeon: Mal Misty, MD;  Location: [facility];  Service: Vascular;  Laterality: Right;   AV FISTULA PLACEMENT Right 07/15/2014   Procedure: ARTERIOVENOUS (AV) FISTULA CREATION;  Surgeon: Elam Dutch, MD;  Location: [place];  Service: Vascular;  Laterality: Right;   CARDIAC CATHETERIZATION N/A 11/27/2014   Procedure: Right/Left Heart Cath and Coronary Angiography;  Surgeon: Burnell Blanks, MD;  Location: [facility];  Service: Cardiovascular;   Laterality: N/A;   CATARACT EXTRACTION W/ INTRAOCULAR LENS IMPLANT Bilateral 2014   [facility]   COLONOSCOPY  2013   Dr. Annamaria Helling [place], [place].   CORONARY ARTERY BYPASS GRAFT N/A 12/09/2014   Procedure: CORONARY ARTERY BYPASS GRAFTING (CABG), ON PUMP, TIMES THREE, USING LEFT INTERNAL MAMMARY ARTERY, RIGHT GREATER SAPHENOUS VEIN HARVESTED ENDOSCOPICALLY;  Surgeon: Gaye Pollack, MD;  Location: [facility];  Service: Open Heart Surgery;  Laterality: N/A;  LIMA-LAD; SVG-OM; SVG-PD   DIALYSIS FISTULA CREATION  08/04/2012 and 10/13   Dr. Harden Mo   INSERTION OF DIALYSIS CATHETER Right 01-15-14   Right chest TDC placed by Dr. Augustin Coupe at [facility]   LIGATION OF ARTERIOVENOUS  FISTULA Right  07/15/2014   Procedure: LIGATION OF ARTERIOVENOUS  FISTULA  (RIGHT RADIOCEPHALIC);  Surgeon: Elam Dutch, MD;  Location: [facility];  Service: Vascular;  Laterality: Right;   LIGATION OF COMPETING BRANCHES OF ARTERIOVENOUS FISTULA Right 05/08/2014   Procedure: LIGATION OF COMPETING BRANCHES OF RIGHT ARM RADIOCEPHALIC ARTERIOVENOUS FISTULA;  Surgeon: Mal Misty, MD;  Location: [facility];  Service: Vascular;  Laterality: Right;   REVISON OF ARTERIOVENOUS FISTULA Right 07/15/2014   Procedure: EXPLORATION OF ARTERIOVENOUS FISTULA;  Surgeon: Elam Dutch, MD;  Location: [facility];  Service: Vascular;  Laterality: Right;   TEE WITHOUT CARDIOVERSION N/A 12/09/2014   Procedure: TRANSESOPHAGEAL ECHOCARDIOGRAM (TEE);  Surgeon: Gaye Pollack, MD;  Location: [facility];  Service: Open Heart Surgery;  Laterality: N/A;    No Known Allergies  Outpatient Encounter Medications as of 11/08/2018  Medication Sig   allopurinol (ZYLOPRIM) 100 MG tablet TAKE 1 TABLET BY MOUTH DAILY   amoxicillin-clavulanate (AUGMENTIN) 250-125 MG tablet Take 1 tablet by mouth daily for 5 days.   atorvastatin (LIPITOR) 20 MG tablet TAKE 1 TABLET BY MOUTH DAILY FOR CHOLESTEROL   calcium carbonate (TUMS - DOSED IN MG ELEMENTAL CALCIUM) 500 MG  chewable tablet Chew 2 tablets by mouth 3 (three) times daily with meals.    cinacalcet (SENSIPAR) 60 MG tablet Take 60 mg by mouth. MondayWednesday, Friday, Sunday   folic acid (FOLVITE) 1 MG tablet Take 1 tablet (1 mg total) by mouth daily.   midodrine (PROAMATINE) 10 MG tablet Take 1 tablet (10 mg total) by mouth 3 (three) times daily with meals.   omeprazole (PRILOSEC) 20 MG capsule Take 20 mg by mouth daily.    thiamine 100 MG tablet Take 1 tablet (100 mg total) by mouth daily.   metoCLOPramide (REGLAN) 5 MG tablet Take 1 tablet (5 mg total) by mouth daily as needed (hiccups). (Patient not taking: Reported on 11/08/2018)   [DISCONTINUED] Multiple Vitamin (MULTIVITAMIN WITH MINERALS) TABS tablet Take 1 tablet by mouth daily.   No facility-administered encounter medications on file as of 11/08/2018.     Review of Systems:  Review of Systems  Constitutional: Positive for malaise/fatigue. Negative for chills and fever.       Up 8 lbs from 10/31  HENT: Negative for sore throat.   Eyes: Negative for blurred vision.       Wears reading glasses  Respiratory: Positive for cough. Negative for sputum production and shortness of breath.   Cardiovascular: Negative for chest pain, palpitations, orthopnea, leg swelling and PND.  Gastrointestinal: Negative for abdominal pain, constipation and diarrhea.       No further nausea, vomiting since he was at HD before hospitalization  Genitourinary: Negative for dysuria.  Musculoskeletal: Negative for falls and joint pain.  Skin: Negative for itching and rash.  Neurological: Negative for dizziness and loss of consciousness.       Hiccups  Endo/Heme/Allergies: Bruises/bleeds easily.  Psychiatric/Behavioral: Positive for memory loss. Negative for depression. The patient has insomnia. The patient is not nervous/anxious.     Health Maintenance  Topic Date Due   TETANUS/TDAP  06/02/2027   INFLUENZA VACCINE  Completed   PNA vac Low Risk Adult   Completed    Physical Exam: Vitals:   11/08/18 0948  BP: 118/66  Pulse: (!) 52  Temp: 97.6 F (36.4 C)  TempSrc: Oral  SpO2: 96%  Weight: 169 lb (76.7 kg)  Height: 5\' 10"  (1.778 m)   Body mass index is 24.25 kg/m. Physical Exam Vitals signs  reviewed.  Constitutional:      General: He is not in acute distress.    Appearance: Normal appearance. He is ill-appearing. He is not toxic-appearing.  HENT:     Head: Normocephalic and atraumatic.  Eyes:     Comments: Reading glasses  Cardiovascular:     Rate and Rhythm: Normal rate and regular rhythm.     Heart sounds: Murmur present.  Pulmonary:     Effort: Pulmonary effort is normal. No respiratory distress.     Breath sounds: Normal breath sounds. No wheezing, rhonchi or rales.  Abdominal:     General: Bowel sounds are normal.  Musculoskeletal: Normal range of motion.     Right lower leg: No edema.     Left lower leg: No edema.  Skin:    General: Skin is warm and dry.     Comments: Chronic ashy skin tone  Neurological:     General: No focal deficit present.     Mental Status: He is alert and oriented to person, place, and time.     Comments: Some short-term memory loss (mild)     Labs reviewed: Basic Metabolic Panel: Recent Labs    11/02/18 0536 11/02/18 0639 11/03/18 0427 11/04/18 0609  NA 137  --  138 139  K 5.2*  --  4.3 4.2  CL 104  --  100 99  CO2 21*  --  25 25  GLUCOSE 106*  --  91 109*  BUN 57*  --  39* 45*  CREATININE 8.28*  --  5.87* 5.42*  CALCIUM 9.0  --  8.8* 9.4  PHOS  --  4.0  --   --    Liver Function Tests: Recent Labs    10/31/18 2126 11/02/18 0536  AST 115* 40  ALT 21 16  ALKPHOS 63 46  BILITOT 1.2 0.6  PROT 7.7 6.0*  ALBUMIN 3.6 2.7*   Recent Labs    10/31/18 2126  LIPASE 34   No results for input(s): AMMONIA in the last 8760 hours. CBC: Recent Labs    10/31/18 2126 11/02/18 0536 11/03/18 0427 11/04/18 0609  WBC 19.8* 11.4* 8.5 8.3  NEUTROABS 17.8*  --   --   --     HGB 12.9* 9.6* 9.4* 10.0*  HCT 38.5* 29.0* 28.3* 30.9*  MCV 101.6* 103.9* 102.2* 104.0*  PLT 169 140* 134* 161   Lipid Panel: No results for input(s): CHOL, HDL, LDLCALC, TRIG, CHOLHDL, LDLDIRECT in the last 8760 hours. Lab Results  Component Value Date   HGBA1C 5.2 12/06/2014    Procedures since last visit: Dg Chest 2 View  Result Date: 10/31/2018 CLINICAL DATA:  Fatigue following dialysis today EXAM: CHEST - 2 VIEW COMPARISON:  10/10/2017 FINDINGS: Cardiac shadow remains enlarged. Postsurgical changes are again seen. Aortic calcifications are noted. The lungs are well aerated bilaterally. Patchy parenchymal opacity is noted in the right lower lobe consistent with acute pneumonia. No bony abnormality is seen. IMPRESSION: Changes consistent with acute right lower lobe pneumonia. Followup PA and lateral chest X-ray is recommended in 3-4 weeks following trial of antibiotic therapy to ensure resolution and exclude underlying malignancy. Electronically Signed   By: Inez Catalina M.D.   On: 10/31/2018 21:24    Assessment/Plan 1. Syncope, unspecified syncope type -felt to be due to hypotension from intravascular depletion post HD--improved with hydration; blood cultures wound up negative   2. Aspiration pneumonia of right lower lobe due to gastric secretions [facility]) -will recheck xray if he's willing when  I see him in Jan particularly if hiccups remain an issue (d/c summary questioned underlying malignancy in area) -seems lungs are now clear and too early to recheck  3. Hypotension due to hypovolemia -seems it's become a much bigger struggle to get keep him euvolemic as his time on HD progresses and his cardiac function has declined -was started on midodrine 10mg  po tid to maintain his bp which is still on the low end today at 118/66 but he is not dizzy or lightheaded  4. ESRD on dialysis [facility]) -continues HD t/r/s  5. Systolic CHF with reduced left ventricular function, NYHA class 3  (HCC) -has some dyspnea on exertion -is to f/u with cardiology after his hospitalization -previously, pacemaker was discussed several years ago but risks for infection felt to be too high as an HD pt and he's now older and overall health declining considerably -off coreg since hospitalization with hypotension -to f/u with cardiology  6. Coronary artery disease involving native coronary artery of native heart with unstable angina pectoris ([place]) -is s/p CABG -no new symptoms in this realm -he is now off coreg, f/u with cardiology  7. ACP (advance care planning) -We discussed ACP for 23 minutes during his visit today as above--he is considering DNR status due to not wanting to be in a coma after CPR or to be placed on a machine -I suspect he did not want to tell me this choice in front of his wife who has dementia and gets very upset when this is discussed.  8.  Intractable hiccups  -renewed reglan for another 5 days and made tid prn rather than daily -he's not sure it's helped -may need to r/o persistent lung path if this does not resolve  9.  Alcohol use:  Reports 2oz nightly of hard liquor typically, rarely will have a second drink  -transaminases have not been elevated during historic checks   Labs/tests ordered:  No new, gets labs at HD  Next appt:  01/24/2019 6 mo f/u--readdress CPR if I don't hear back from him b/w now and then  [place]. Sheika Coutts, D.O. [employer] [address], [place] [address] Cell Phone (Mon-Fri 8am-5pm):  [phone] On Call:  [phone] & follow prompts after 5pm & weekends Office Phone:  [phone] Office Fax:  [phone]

## 2018-11-09 DIAGNOSIS — Z9189 Other specified personal risk factors, not elsewhere classified: Secondary | ICD-10-CM | POA: Diagnosis not present

## 2018-11-09 DIAGNOSIS — D509 Iron deficiency anemia, unspecified: Secondary | ICD-10-CM | POA: Diagnosis not present

## 2018-11-09 DIAGNOSIS — N186 End stage renal disease: Secondary | ICD-10-CM | POA: Diagnosis not present

## 2018-11-09 DIAGNOSIS — D631 Anemia in chronic kidney disease: Secondary | ICD-10-CM | POA: Diagnosis not present

## 2018-11-09 DIAGNOSIS — N184 Chronic kidney disease, stage 4 (severe): Secondary | ICD-10-CM | POA: Diagnosis not present

## 2018-11-09 DIAGNOSIS — Z992 Dependence on renal dialysis: Secondary | ICD-10-CM | POA: Diagnosis not present

## 2018-11-09 DIAGNOSIS — N2581 Secondary hyperparathyroidism of renal origin: Secondary | ICD-10-CM | POA: Diagnosis not present

## 2018-11-10 ENCOUNTER — Telehealth: Payer: Self-pay | Admitting: *Deleted

## 2018-11-10 NOTE — Telephone Encounter (Signed)
Patient called and stated that he had discussed DNR with Dr. Mariea Clonts on 11/08/2018. He has decided to have a DNR filled out and signed. Stated that he DOES NOT want CPR.

## 2018-11-11 DIAGNOSIS — D631 Anemia in chronic kidney disease: Secondary | ICD-10-CM | POA: Diagnosis not present

## 2018-11-11 DIAGNOSIS — Z992 Dependence on renal dialysis: Secondary | ICD-10-CM | POA: Diagnosis not present

## 2018-11-11 DIAGNOSIS — N184 Chronic kidney disease, stage 4 (severe): Secondary | ICD-10-CM | POA: Diagnosis not present

## 2018-11-11 DIAGNOSIS — D509 Iron deficiency anemia, unspecified: Secondary | ICD-10-CM | POA: Diagnosis not present

## 2018-11-11 DIAGNOSIS — N2581 Secondary hyperparathyroidism of renal origin: Secondary | ICD-10-CM | POA: Diagnosis not present

## 2018-11-11 DIAGNOSIS — N186 End stage renal disease: Secondary | ICD-10-CM | POA: Diagnosis not present

## 2018-11-13 NOTE — Telephone Encounter (Signed)
Ok, we can complete this and copy for scanning in [facility].  Also, we will need to send him the original to be placed in the capsule in his freezer.  He mentioned also wanting one for his wallet, so I'll make one for that, too.

## 2018-11-14 DIAGNOSIS — D509 Iron deficiency anemia, unspecified: Secondary | ICD-10-CM | POA: Diagnosis not present

## 2018-11-14 DIAGNOSIS — N184 Chronic kidney disease, stage 4 (severe): Secondary | ICD-10-CM | POA: Diagnosis not present

## 2018-11-14 DIAGNOSIS — N2581 Secondary hyperparathyroidism of renal origin: Secondary | ICD-10-CM | POA: Diagnosis not present

## 2018-11-14 DIAGNOSIS — Z9189 Other specified personal risk factors, not elsewhere classified: Secondary | ICD-10-CM | POA: Diagnosis not present

## 2018-11-14 DIAGNOSIS — N186 End stage renal disease: Secondary | ICD-10-CM | POA: Diagnosis not present

## 2018-11-14 DIAGNOSIS — D631 Anemia in chronic kidney disease: Secondary | ICD-10-CM | POA: Diagnosis not present

## 2018-11-14 DIAGNOSIS — Z992 Dependence on renal dialysis: Secondary | ICD-10-CM | POA: Diagnosis not present

## 2018-11-14 DIAGNOSIS — Z20828 Contact with and (suspected) exposure to other viral communicable diseases: Secondary | ICD-10-CM | POA: Diagnosis not present

## 2018-11-14 NOTE — Telephone Encounter (Signed)
Copy made of the DNR and sent to scanning Patient notified and stated to bring to [place] tomorrow and give to a nurse there to give to him since he is in [place].

## 2018-11-16 DIAGNOSIS — N2581 Secondary hyperparathyroidism of renal origin: Secondary | ICD-10-CM | POA: Diagnosis not present

## 2018-11-16 DIAGNOSIS — D631 Anemia in chronic kidney disease: Secondary | ICD-10-CM | POA: Diagnosis not present

## 2018-11-16 DIAGNOSIS — Z992 Dependence on renal dialysis: Secondary | ICD-10-CM | POA: Diagnosis not present

## 2018-11-16 DIAGNOSIS — D509 Iron deficiency anemia, unspecified: Secondary | ICD-10-CM | POA: Diagnosis not present

## 2018-11-16 DIAGNOSIS — N184 Chronic kidney disease, stage 4 (severe): Secondary | ICD-10-CM | POA: Diagnosis not present

## 2018-11-16 DIAGNOSIS — N186 End stage renal disease: Secondary | ICD-10-CM | POA: Diagnosis not present

## 2018-11-18 DIAGNOSIS — D509 Iron deficiency anemia, unspecified: Secondary | ICD-10-CM | POA: Diagnosis not present

## 2018-11-18 DIAGNOSIS — Z992 Dependence on renal dialysis: Secondary | ICD-10-CM | POA: Diagnosis not present

## 2018-11-18 DIAGNOSIS — N186 End stage renal disease: Secondary | ICD-10-CM | POA: Diagnosis not present

## 2018-11-18 DIAGNOSIS — D631 Anemia in chronic kidney disease: Secondary | ICD-10-CM | POA: Diagnosis not present

## 2018-11-18 DIAGNOSIS — N2581 Secondary hyperparathyroidism of renal origin: Secondary | ICD-10-CM | POA: Diagnosis not present

## 2018-11-18 DIAGNOSIS — N184 Chronic kidney disease, stage 4 (severe): Secondary | ICD-10-CM | POA: Diagnosis not present

## 2018-11-21 DIAGNOSIS — Z992 Dependence on renal dialysis: Secondary | ICD-10-CM | POA: Diagnosis not present

## 2018-11-21 DIAGNOSIS — N184 Chronic kidney disease, stage 4 (severe): Secondary | ICD-10-CM | POA: Diagnosis not present

## 2018-11-21 DIAGNOSIS — D631 Anemia in chronic kidney disease: Secondary | ICD-10-CM | POA: Diagnosis not present

## 2018-11-21 DIAGNOSIS — D509 Iron deficiency anemia, unspecified: Secondary | ICD-10-CM | POA: Diagnosis not present

## 2018-11-21 DIAGNOSIS — N186 End stage renal disease: Secondary | ICD-10-CM | POA: Diagnosis not present

## 2018-11-21 DIAGNOSIS — N2581 Secondary hyperparathyroidism of renal origin: Secondary | ICD-10-CM | POA: Diagnosis not present

## 2018-11-23 DIAGNOSIS — N184 Chronic kidney disease, stage 4 (severe): Secondary | ICD-10-CM | POA: Diagnosis not present

## 2018-11-23 DIAGNOSIS — D631 Anemia in chronic kidney disease: Secondary | ICD-10-CM | POA: Diagnosis not present

## 2018-11-23 DIAGNOSIS — Z992 Dependence on renal dialysis: Secondary | ICD-10-CM | POA: Diagnosis not present

## 2018-11-23 DIAGNOSIS — N186 End stage renal disease: Secondary | ICD-10-CM | POA: Diagnosis not present

## 2018-11-23 DIAGNOSIS — D509 Iron deficiency anemia, unspecified: Secondary | ICD-10-CM | POA: Diagnosis not present

## 2018-11-23 DIAGNOSIS — N2581 Secondary hyperparathyroidism of renal origin: Secondary | ICD-10-CM | POA: Diagnosis not present

## 2018-11-25 DIAGNOSIS — N2581 Secondary hyperparathyroidism of renal origin: Secondary | ICD-10-CM | POA: Diagnosis not present

## 2018-11-25 DIAGNOSIS — D509 Iron deficiency anemia, unspecified: Secondary | ICD-10-CM | POA: Diagnosis not present

## 2018-11-25 DIAGNOSIS — D631 Anemia in chronic kidney disease: Secondary | ICD-10-CM | POA: Diagnosis not present

## 2018-11-25 DIAGNOSIS — N186 End stage renal disease: Secondary | ICD-10-CM | POA: Diagnosis not present

## 2018-11-25 DIAGNOSIS — Z992 Dependence on renal dialysis: Secondary | ICD-10-CM | POA: Diagnosis not present

## 2018-11-25 DIAGNOSIS — N184 Chronic kidney disease, stage 4 (severe): Secondary | ICD-10-CM | POA: Diagnosis not present

## 2018-11-27 DIAGNOSIS — D631 Anemia in chronic kidney disease: Secondary | ICD-10-CM | POA: Diagnosis not present

## 2018-11-27 DIAGNOSIS — Z992 Dependence on renal dialysis: Secondary | ICD-10-CM | POA: Diagnosis not present

## 2018-11-27 DIAGNOSIS — N186 End stage renal disease: Secondary | ICD-10-CM | POA: Diagnosis not present

## 2018-11-27 DIAGNOSIS — N2581 Secondary hyperparathyroidism of renal origin: Secondary | ICD-10-CM | POA: Diagnosis not present

## 2018-11-27 DIAGNOSIS — N184 Chronic kidney disease, stage 4 (severe): Secondary | ICD-10-CM | POA: Diagnosis not present

## 2018-11-27 DIAGNOSIS — D509 Iron deficiency anemia, unspecified: Secondary | ICD-10-CM | POA: Diagnosis not present

## 2018-11-28 NOTE — Progress Notes (Signed)
CARDIOLOGY OFFICE NOTE  Date:  12/06/2018    Eric [name] Date of Birth: [birth year] Medical Record #[id number]  PCP:  Gayland Curry, DO  Cardiologist:  Ree Shay   Chief Complaint  Patient presents with  . Hospitalization Follow-up    Seen for Dr. Caryl Comes    History of Present Illness: Eric [name] is a 85 y.o. male who presents today for a post hospital visit.  Seen for Dr. Caryl Comes.   He has a history of ESRD - on HD, HTN, HLD, GERD, gout, AVR in 2016, CAD with CABG in 2016 and chronic systolic HF with EF of 20 to 35%. He underwent CABG x 3 with LIMA to LAD, SVG to OM2 and SVG to PDA with AVR with #23 Edwards Magna-Ease pericardial valve by Dr. Cyndia Bent on 12/09/2014  Last seen here back in June by me - Dr. Mariea Clonts had reached out to me back in May - there was concern for worsening heart failure/failure to thrive and Echo did confirm this - Coreg was added. Noted he was having more issues with hypotension with his dialysis.   He was then admitted last month with syncope following a dialysis session. Associated with N/V. He was hypotensive and orthostatic. In the ER CXR noted RLL infiltrate. He was seen by speech - recommended to have diet of regular consistency with thin liquids. He was placed on Midodrine and received a course of antibiotics.   The patient does not have symptoms concerning for COVID-19 infection (fever, chills, cough, or new shortness of breath).   Comes in today. Here alone. He says he ok. Always tired - no different. Now on Midodrine. He does need this refilled. No chest pain. Breathing is ok. He is not dizzy. Dialysis seems to be going ok - he is more fatigued. No more passing out. He did not seem to be aware of the swallowing issue. He notes he has lots of phlegm during the night - it is thick - thus hard to swallow - so he tries to spit.   Past Medical History:  Diagnosis Date  . Anxiety   . Arthritis   . Complication of anesthesia    " one time my  heart stopped due to a medication that I was on."   . ESRD (end stage renal disease) on dialysis ([facility])    Tues, Thursday, Saturday; [facility]; [place] (10/10/2017)  . GERD (gastroesophageal reflux disease)   . Gout   . Heart murmur    as a teen  . Herpes zoster 04/2012  . Hypertension   . Pneumonia 10/2018  . Shortness of breath dyspnea    with exertion    Past Surgical History:  Procedure Laterality Date  . AORTIC VALVE REPLACEMENT N/A 12/09/2014   Procedure: AORTIC VALVE REPLACEMENT (AVR) WITH 23MM MAGNA EASE BIOPROSTHETIC VALVE;  Surgeon: Gaye Pollack, MD;  Location: [facility] OR;  Service: Open Heart Surgery;  Laterality: N/A;  . APPENDECTOMY  1944  . AV FISTULA PLACEMENT Left    Left Cimino AVF placed in [place]   . AV FISTULA PLACEMENT Right 03/06/2014   Procedure: ARTERIOVENOUS (AV) FISTULA CREATION-right radiocephalic;  Surgeon: Mal Misty, MD;  Location: [facility] OR;  Service: Vascular;  Laterality: Right;  . AV FISTULA PLACEMENT Right 07/15/2014   Procedure: ARTERIOVENOUS (AV) FISTULA CREATION;  Surgeon: Elam Dutch, MD;  Location: [facility];  Service: Vascular;  Laterality: Right;  . CARDIAC CATHETERIZATION N/A 11/27/2014  Procedure: Right/Left Heart Cath and Coronary Angiography;  Surgeon: Burnell Blanks, MD;  Location: [facility];  Service: Cardiovascular;  Laterality: N/A;  . CATARACT EXTRACTION W/ INTRAOCULAR LENS IMPLANT Bilateral 2014   [facility]  . COLONOSCOPY  2013   Dr. Annamaria Helling [place], [place].  . CORONARY ARTERY BYPASS GRAFT N/A 12/09/2014   Procedure: CORONARY ARTERY BYPASS GRAFTING (CABG), ON PUMP, TIMES THREE, USING LEFT INTERNAL MAMMARY ARTERY, RIGHT GREATER SAPHENOUS VEIN HARVESTED ENDOSCOPICALLY;  Surgeon: Gaye Pollack, MD;  Location: [facility];  Service: Open Heart Surgery;  Laterality: N/A;  LIMA-LAD; SVG-OM; SVG-PD  . DIALYSIS FISTULA CREATION  08/04/2012 and 10/13   Dr. Harden Mo  . INSERTION OF DIALYSIS CATHETER Right  01-15-14   Right chest TDC placed by Dr. Augustin Coupe at [facility]  . LIGATION OF ARTERIOVENOUS  FISTULA Right 07/15/2014   Procedure: LIGATION OF ARTERIOVENOUS  FISTULA  (RIGHT RADIOCEPHALIC);  Surgeon: Elam Dutch, MD;  Location: [place];  Service: Vascular;  Laterality: Right;  . LIGATION OF COMPETING BRANCHES OF ARTERIOVENOUS FISTULA Right 05/08/2014   Procedure: LIGATION OF COMPETING BRANCHES OF RIGHT ARM RADIOCEPHALIC ARTERIOVENOUS FISTULA;  Surgeon: Mal Misty, MD;  Location: [place];  Service: Vascular;  Laterality: Right;  . REVISON OF ARTERIOVENOUS FISTULA Right 07/15/2014   Procedure: EXPLORATION OF ARTERIOVENOUS FISTULA;  Surgeon: Elam Dutch, MD;  Location: [place];  Service: Vascular;  Laterality: Right;  . TEE WITHOUT CARDIOVERSION N/A 12/09/2014   Procedure: TRANSESOPHAGEAL ECHOCARDIOGRAM (TEE);  Surgeon: Gaye Pollack, MD;  Location: [facility];  Service: Open Heart Surgery;  Laterality: N/A;     Medications: Current Meds  Medication Sig  . allopurinol (ZYLOPRIM) 100 MG tablet TAKE 1 TABLET BY MOUTH DAILY  . atorvastatin (LIPITOR) 20 MG tablet TAKE 1 TABLET BY MOUTH DAILY FOR CHOLESTEROL  . calcium carbonate (TUMS - DOSED IN MG ELEMENTAL CALCIUM) 500 MG chewable tablet Chew 2 tablets by mouth 3 (three) times daily with meals.   . cinacalcet (SENSIPAR) 60 MG tablet Take 60 mg by mouth. MondayWednesday, Friday, Sunday  . metoCLOPramide (REGLAN) 5 MG tablet Take 1 tablet (5 mg total) by mouth 3 (three) times daily as needed (hiccups).  Marland Kitchen omeprazole (PRILOSEC) 20 MG capsule Take 20 mg by mouth daily.      Allergies: No Known Allergies  Social History: The patient  reports that he has never smoked. He has never used smokeless tobacco. He reports current alcohol use of about 7.0 standard drinks of alcohol per week. He reports that he does not use drugs.   Family History: The patient's family history includes Cancer in his mother; Diabetes in his father; Lung cancer (age of onset:  46) in his father.   Review of Systems: Please see the history of present illness.   All other systems are reviewed and negative.   Physical Exam: VS:  BP 122/70   Pulse 72   Ht 5\' 9"  (1.753 m)   Wt 163 lb 12.8 oz (74.3 kg)   SpO2 98%   BMI 24.19 kg/m  .  BMI Body mass index is 24.19 kg/m. Wt Readings from Last 3 Encounters:  12/06/18 163 lb 12.8 oz (74.3 kg)  11/08/18 169 lb (76.7 kg)  11/04/18 161 lb 9.6 oz (73.3 kg)    General: Pleasant. Elderly. Alert and in no acute distress.   HEENT: Normal.  Neck: Supple, no JVD, carotid bruits, or masses noted.  Cardiac: Regular rate and rhythm. No murmurs, rubs,  or gallops. No edema.  Respiratory:  Lungs are clear to auscultation bilaterally with normal work of breathing.  GI: Soft and nontender.  MS: No deformity or atrophy. Gait and ROM intact.  Skin: Warm and dry. Color is normal.  Neuro:  Strength and sensation are intact and no gross focal deficits noted.  Psych: Alert, appropriate and with normal affect.   LABORATORY DATA:  EKG:  EKG is not ordered today.  Lab Results  Component Value Date   WBC 8.3 11/04/2018   HGB 10.0 (L) 11/04/2018   HCT 30.9 (L) 11/04/2018   PLT 161 11/04/2018   GLUCOSE 109 (H) 11/04/2018   CHOL 125 02/07/2017   TRIG 80 02/07/2017   HDL 37 (L) 02/07/2017   LDLCALC 72 02/07/2017   ALT 16 11/02/2018   AST 40 11/02/2018   NA 139 11/04/2018   K 4.2 11/04/2018   CL 99 11/04/2018   CREATININE 5.42 (H) 11/04/2018   BUN 45 (H) 11/04/2018   CO2 25 11/04/2018   INR 1.42 12/09/2014   HGBA1C 5.2 12/06/2014     BNP (last 3 results) No results for input(s): BNP in the last 8760 hours.  ProBNP (last 3 results) No results for input(s): PROBNP in the last 8760 hours.   Other Studies Reviewed Today:  ECHO IMPRESSIONS 05/2018  1. The left ventricle has severely reduced systolic function, with an ejection fraction of 20-25%. The cavity size was moderately dilated. Left ventricular diastolic  Doppler parameters are indeterminate. There is abnormal septal motion consistent with  left bundle branch block. Left ventricular diffuse hypokinesis. 2. The right ventricle has severely reduced systolic function. The cavity was normal. There is no increase in right ventricular wall thickness. 3. A 97mm an Edwards Magna-Ease valve is present in the aortic position. Procedure Date: 12/09/14. The leaflets are not well visualized. No evidence of prosthetic or paravalvular regurgitation. Mean systolic gradient 8 mmHg, in the setting of severely  reduced LV systolic function. 4. Left atrial size was moderately dilated. 5. Mitral valve regurgitation is moderate by color flow Doppler. 6. When compared to the prior study: Compared to exam from 06/25/16, the ejection fraction has decreased. Mitral regurgitation may have increased. Side by side comparison of images performed.    Assessment/Plan:  1. Recent syncope - due to hypotension/orthostasis/infection - now on Midodrine - finished antibiotics. BP is ok. Seems to be tolerating dialysis ok at this time.   2. Chronic systolic HF - EF 20 to 07% - not on ideal therapy due to his hypotension with dialysis and overall soft BP - now having to be on Midodrine - would continue with current regimen.   3. Concern for aspiration - has lots of thick phlegm as well - he stopped using his wedge pillow - encouraged to use this again.   4. CAD/AS - prior CABG/AVR from 2016 - favor conservative management.   5. ESRD - on dialysis - ok to hold Coreg on dialysis days  - now on Midodrine.   5. Chronic Anemia - remains on Aranesp - managed by Renal.Not discussed.  6. HLD - on statin therapy.  7. Chronic LBBB  8. Chronic fatigue - most likely multifactorial but suspect this is due to dialysis and advancing age.   9. COVID-19 Education: The signs and symptoms of COVID-19 were discussed with the patient and how to seek care for testing (follow up  with PCP or arrange E-visit).  The importance of social distancing, staying at home, hand hygiene and  wearing a mask when out in public were discussed today.  Current medicines are reviewed with the patient today.  The patient does not have concerns regarding medicines other than what has been noted above.  The following changes have been made:  See above.  Labs/ tests ordered today include:   No orders of the defined types were placed in this encounter.    Disposition:   FU with me in 6 months.    Patient is agreeable to this plan and will call if any problems develop in the interim.   SignedTruitt Merle, NP  12/06/2018 2:44 PM  [facility] [address] [place], [place]  [address] Phone: [phone] Fax: [phone]

## 2018-11-29 DIAGNOSIS — N186 End stage renal disease: Secondary | ICD-10-CM | POA: Diagnosis not present

## 2018-11-29 DIAGNOSIS — N2581 Secondary hyperparathyroidism of renal origin: Secondary | ICD-10-CM | POA: Diagnosis not present

## 2018-11-29 DIAGNOSIS — N184 Chronic kidney disease, stage 4 (severe): Secondary | ICD-10-CM | POA: Diagnosis not present

## 2018-11-29 DIAGNOSIS — D509 Iron deficiency anemia, unspecified: Secondary | ICD-10-CM | POA: Diagnosis not present

## 2018-11-29 DIAGNOSIS — D631 Anemia in chronic kidney disease: Secondary | ICD-10-CM | POA: Diagnosis not present

## 2018-11-29 DIAGNOSIS — Z992 Dependence on renal dialysis: Secondary | ICD-10-CM | POA: Diagnosis not present

## 2018-12-02 DIAGNOSIS — Z992 Dependence on renal dialysis: Secondary | ICD-10-CM | POA: Diagnosis not present

## 2018-12-02 DIAGNOSIS — N184 Chronic kidney disease, stage 4 (severe): Secondary | ICD-10-CM | POA: Diagnosis not present

## 2018-12-02 DIAGNOSIS — N186 End stage renal disease: Secondary | ICD-10-CM | POA: Diagnosis not present

## 2018-12-02 DIAGNOSIS — D509 Iron deficiency anemia, unspecified: Secondary | ICD-10-CM | POA: Diagnosis not present

## 2018-12-02 DIAGNOSIS — N2581 Secondary hyperparathyroidism of renal origin: Secondary | ICD-10-CM | POA: Diagnosis not present

## 2018-12-02 DIAGNOSIS — D631 Anemia in chronic kidney disease: Secondary | ICD-10-CM | POA: Diagnosis not present

## 2018-12-06 ENCOUNTER — Encounter: Payer: Self-pay | Admitting: Nurse Practitioner

## 2018-12-06 ENCOUNTER — Ambulatory Visit (INDEPENDENT_AMBULATORY_CARE_PROVIDER_SITE_OTHER): Payer: Medicare Other | Admitting: Nurse Practitioner

## 2018-12-06 ENCOUNTER — Other Ambulatory Visit: Payer: Self-pay

## 2018-12-06 VITALS — BP 122/70 | HR 72 | Ht 69.0 in | Wt 163.8 lb

## 2018-12-06 DIAGNOSIS — Z992 Dependence on renal dialysis: Secondary | ICD-10-CM | POA: Diagnosis not present

## 2018-12-06 DIAGNOSIS — I447 Left bundle-branch block, unspecified: Secondary | ICD-10-CM | POA: Diagnosis not present

## 2018-12-06 DIAGNOSIS — I255 Ischemic cardiomyopathy: Secondary | ICD-10-CM | POA: Diagnosis not present

## 2018-12-06 DIAGNOSIS — Z7189 Other specified counseling: Secondary | ICD-10-CM

## 2018-12-06 DIAGNOSIS — I10 Essential (primary) hypertension: Secondary | ICD-10-CM

## 2018-12-06 DIAGNOSIS — N186 End stage renal disease: Secondary | ICD-10-CM | POA: Diagnosis not present

## 2018-12-06 DIAGNOSIS — I429 Cardiomyopathy, unspecified: Secondary | ICD-10-CM | POA: Diagnosis not present

## 2018-12-06 DIAGNOSIS — I25810 Atherosclerosis of coronary artery bypass graft(s) without angina pectoris: Secondary | ICD-10-CM | POA: Diagnosis not present

## 2018-12-06 DIAGNOSIS — I951 Orthostatic hypotension: Secondary | ICD-10-CM

## 2018-12-06 MED ORDER — MIDODRINE HCL 10 MG PO TABS: 10.0000 mg | ORAL_TABLET | Freq: Three times a day (TID) | ORAL | 3 refills | Status: AC

## 2018-12-06 NOTE — Patient Instructions (Addendum)
After Visit Summary:  We will be checking the following labs today - NONE   Medication Instructions:    Continue with your current medicines.   I did refill the Midodrine today.    If you need a refill on your cardiac medications before your next appointment, please call your pharmacy.     Testing/Procedures To Be Arranged:  N/A  Follow-Up:   See [place] back in 6 months.   At [facility], you and your health needs are our priority.  As part of our continuing mission to provide you with exceptional heart care, we have created designated Provider Care Teams.  These Care Teams include your primary Cardiologist (physician) and Advanced Practice Providers (APPs -  Physician Assistants and Nurse Practitioners) who all work together to provide you with the care you need, when you need it.  Special Instructions:  . Stay safe, stay home, wash your hands for at least 20 seconds and wear a mask when out in public.  . It was good to talk with you today.    Call the [place] office at [phone] if you have any questions, problems or concerns.

## 2019-01-05 DIAGNOSIS — N186 End stage renal disease: Secondary | ICD-10-CM | POA: Diagnosis not present

## 2019-01-05 DIAGNOSIS — Z992 Dependence on renal dialysis: Secondary | ICD-10-CM | POA: Diagnosis not present

## 2019-01-05 DIAGNOSIS — N2889 Other specified disorders of kidney and ureter: Secondary | ICD-10-CM | POA: Diagnosis not present

## 2019-01-07 DIAGNOSIS — Z992 Dependence on renal dialysis: Secondary | ICD-10-CM | POA: Diagnosis not present

## 2019-01-07 DIAGNOSIS — N184 Chronic kidney disease, stage 4 (severe): Secondary | ICD-10-CM | POA: Diagnosis not present

## 2019-01-07 DIAGNOSIS — N186 End stage renal disease: Secondary | ICD-10-CM | POA: Diagnosis not present

## 2019-01-07 DIAGNOSIS — N2581 Secondary hyperparathyroidism of renal origin: Secondary | ICD-10-CM | POA: Diagnosis not present

## 2019-01-07 DIAGNOSIS — D631 Anemia in chronic kidney disease: Secondary | ICD-10-CM | POA: Diagnosis not present

## 2019-01-09 DIAGNOSIS — D631 Anemia in chronic kidney disease: Secondary | ICD-10-CM | POA: Diagnosis not present

## 2019-01-09 DIAGNOSIS — N2581 Secondary hyperparathyroidism of renal origin: Secondary | ICD-10-CM | POA: Diagnosis not present

## 2019-01-09 DIAGNOSIS — N186 End stage renal disease: Secondary | ICD-10-CM | POA: Diagnosis not present

## 2019-01-09 DIAGNOSIS — Z992 Dependence on renal dialysis: Secondary | ICD-10-CM | POA: Diagnosis not present

## 2019-01-09 DIAGNOSIS — N184 Chronic kidney disease, stage 4 (severe): Secondary | ICD-10-CM | POA: Diagnosis not present

## 2019-01-11 DIAGNOSIS — D631 Anemia in chronic kidney disease: Secondary | ICD-10-CM | POA: Diagnosis not present

## 2019-01-11 DIAGNOSIS — N184 Chronic kidney disease, stage 4 (severe): Secondary | ICD-10-CM | POA: Diagnosis not present

## 2019-01-11 DIAGNOSIS — Z992 Dependence on renal dialysis: Secondary | ICD-10-CM | POA: Diagnosis not present

## 2019-01-11 DIAGNOSIS — N2581 Secondary hyperparathyroidism of renal origin: Secondary | ICD-10-CM | POA: Diagnosis not present

## 2019-01-11 DIAGNOSIS — N186 End stage renal disease: Secondary | ICD-10-CM | POA: Diagnosis not present

## 2019-01-13 DIAGNOSIS — D631 Anemia in chronic kidney disease: Secondary | ICD-10-CM | POA: Diagnosis not present

## 2019-01-13 DIAGNOSIS — N2581 Secondary hyperparathyroidism of renal origin: Secondary | ICD-10-CM | POA: Diagnosis not present

## 2019-01-13 DIAGNOSIS — Z992 Dependence on renal dialysis: Secondary | ICD-10-CM | POA: Diagnosis not present

## 2019-01-13 DIAGNOSIS — N186 End stage renal disease: Secondary | ICD-10-CM | POA: Diagnosis not present

## 2019-01-13 DIAGNOSIS — N184 Chronic kidney disease, stage 4 (severe): Secondary | ICD-10-CM | POA: Diagnosis not present

## 2019-01-16 DIAGNOSIS — D631 Anemia in chronic kidney disease: Secondary | ICD-10-CM | POA: Diagnosis not present

## 2019-01-16 DIAGNOSIS — Z992 Dependence on renal dialysis: Secondary | ICD-10-CM | POA: Diagnosis not present

## 2019-01-16 DIAGNOSIS — N2581 Secondary hyperparathyroidism of renal origin: Secondary | ICD-10-CM | POA: Diagnosis not present

## 2019-01-16 DIAGNOSIS — N184 Chronic kidney disease, stage 4 (severe): Secondary | ICD-10-CM | POA: Diagnosis not present

## 2019-01-16 DIAGNOSIS — N186 End stage renal disease: Secondary | ICD-10-CM | POA: Diagnosis not present

## 2019-01-17 DIAGNOSIS — Z23 Encounter for immunization: Secondary | ICD-10-CM | POA: Diagnosis not present

## 2019-01-18 DIAGNOSIS — N186 End stage renal disease: Secondary | ICD-10-CM | POA: Diagnosis not present

## 2019-01-18 DIAGNOSIS — N2581 Secondary hyperparathyroidism of renal origin: Secondary | ICD-10-CM | POA: Diagnosis not present

## 2019-01-18 DIAGNOSIS — N184 Chronic kidney disease, stage 4 (severe): Secondary | ICD-10-CM | POA: Diagnosis not present

## 2019-01-18 DIAGNOSIS — Z992 Dependence on renal dialysis: Secondary | ICD-10-CM | POA: Diagnosis not present

## 2019-01-18 DIAGNOSIS — D631 Anemia in chronic kidney disease: Secondary | ICD-10-CM | POA: Diagnosis not present

## 2019-01-20 DIAGNOSIS — N2581 Secondary hyperparathyroidism of renal origin: Secondary | ICD-10-CM | POA: Diagnosis not present

## 2019-01-20 DIAGNOSIS — Z992 Dependence on renal dialysis: Secondary | ICD-10-CM | POA: Diagnosis not present

## 2019-01-20 DIAGNOSIS — N186 End stage renal disease: Secondary | ICD-10-CM | POA: Diagnosis not present

## 2019-01-20 DIAGNOSIS — N184 Chronic kidney disease, stage 4 (severe): Secondary | ICD-10-CM | POA: Diagnosis not present

## 2019-01-20 DIAGNOSIS — D631 Anemia in chronic kidney disease: Secondary | ICD-10-CM | POA: Diagnosis not present

## 2019-01-23 DIAGNOSIS — N186 End stage renal disease: Secondary | ICD-10-CM | POA: Diagnosis not present

## 2019-01-23 DIAGNOSIS — Z992 Dependence on renal dialysis: Secondary | ICD-10-CM | POA: Diagnosis not present

## 2019-01-23 DIAGNOSIS — N2581 Secondary hyperparathyroidism of renal origin: Secondary | ICD-10-CM | POA: Diagnosis not present

## 2019-01-23 DIAGNOSIS — D631 Anemia in chronic kidney disease: Secondary | ICD-10-CM | POA: Diagnosis not present

## 2019-01-23 DIAGNOSIS — N184 Chronic kidney disease, stage 4 (severe): Secondary | ICD-10-CM | POA: Diagnosis not present

## 2019-01-24 ENCOUNTER — Other Ambulatory Visit: Payer: Self-pay

## 2019-01-24 ENCOUNTER — Encounter: Payer: Self-pay | Admitting: Internal Medicine

## 2019-01-24 ENCOUNTER — Non-Acute Institutional Stay: Payer: Medicare Other | Admitting: Internal Medicine

## 2019-01-24 VITALS — BP 122/72 | HR 70 | Temp 97.5°F

## 2019-01-24 DIAGNOSIS — G8929 Other chronic pain: Secondary | ICD-10-CM | POA: Diagnosis not present

## 2019-01-24 DIAGNOSIS — N186 End stage renal disease: Secondary | ICD-10-CM | POA: Diagnosis not present

## 2019-01-24 DIAGNOSIS — Z66 Do not resuscitate: Secondary | ICD-10-CM

## 2019-01-24 DIAGNOSIS — M545 Low back pain, unspecified: Secondary | ICD-10-CM

## 2019-01-24 DIAGNOSIS — I5020 Unspecified systolic (congestive) heart failure: Secondary | ICD-10-CM

## 2019-01-24 DIAGNOSIS — I25110 Atherosclerotic heart disease of native coronary artery with unstable angina pectoris: Secondary | ICD-10-CM

## 2019-01-24 DIAGNOSIS — I953 Hypotension of hemodialysis: Secondary | ICD-10-CM | POA: Diagnosis not present

## 2019-01-24 DIAGNOSIS — Z9189 Other specified personal risk factors, not elsewhere classified: Secondary | ICD-10-CM | POA: Diagnosis not present

## 2019-01-24 DIAGNOSIS — Z992 Dependence on renal dialysis: Secondary | ICD-10-CM
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
## 2019-01-25 DIAGNOSIS — N184 Chronic kidney disease, stage 4 (severe): Secondary | ICD-10-CM | POA: Diagnosis not present

## 2019-01-25 DIAGNOSIS — N186 End stage renal disease: Secondary | ICD-10-CM | POA: Diagnosis not present

## 2019-01-25 DIAGNOSIS — D631 Anemia in chronic kidney disease: Secondary | ICD-10-CM | POA: Diagnosis not present

## 2019-01-25 DIAGNOSIS — Z992 Dependence on renal dialysis: Secondary | ICD-10-CM | POA: Diagnosis not present

## 2019-01-25 DIAGNOSIS — N2581 Secondary hyperparathyroidism of renal origin: Secondary | ICD-10-CM | POA: Diagnosis not present

## 2019-01-27 DIAGNOSIS — N2581 Secondary hyperparathyroidism of renal origin: Secondary | ICD-10-CM | POA: Diagnosis not present

## 2019-01-27 DIAGNOSIS — N186 End stage renal disease: Secondary | ICD-10-CM | POA: Diagnosis not present

## 2019-01-27 DIAGNOSIS — Z992 Dependence on renal dialysis: Secondary | ICD-10-CM | POA: Diagnosis not present

## 2019-01-27 DIAGNOSIS — D631 Anemia in chronic kidney disease: Secondary | ICD-10-CM | POA: Diagnosis not present

## 2019-01-27 DIAGNOSIS — N184 Chronic kidney disease, stage 4 (severe): Secondary | ICD-10-CM | POA: Diagnosis not present

## 2019-01-30 DIAGNOSIS — N2581 Secondary hyperparathyroidism of renal origin: Secondary | ICD-10-CM | POA: Diagnosis not present

## 2019-01-30 DIAGNOSIS — N184 Chronic kidney disease, stage 4 (severe): Secondary | ICD-10-CM | POA: Diagnosis not present

## 2019-01-30 DIAGNOSIS — Z992 Dependence on renal dialysis: Secondary | ICD-10-CM | POA: Diagnosis not present

## 2019-01-30 DIAGNOSIS — N186 End stage renal disease: Secondary | ICD-10-CM | POA: Diagnosis not present

## 2019-01-30 DIAGNOSIS — D631 Anemia in chronic kidney disease: Secondary | ICD-10-CM | POA: Diagnosis not present

## 2019-02-01 DIAGNOSIS — N2581 Secondary hyperparathyroidism of renal origin: Secondary | ICD-10-CM | POA: Diagnosis not present

## 2019-02-01 DIAGNOSIS — Z992 Dependence on renal dialysis: Secondary | ICD-10-CM | POA: Diagnosis not present

## 2019-02-01 DIAGNOSIS — N186 End stage renal disease: Secondary | ICD-10-CM | POA: Diagnosis not present

## 2019-02-01 DIAGNOSIS — D631 Anemia in chronic kidney disease: Secondary | ICD-10-CM | POA: Diagnosis not present

## 2019-02-01 DIAGNOSIS — N184 Chronic kidney disease, stage 4 (severe): Secondary | ICD-10-CM | POA: Diagnosis not present

## 2019-02-03 DIAGNOSIS — D631 Anemia in chronic kidney disease: Secondary | ICD-10-CM | POA: Diagnosis not present

## 2019-02-03 DIAGNOSIS — N186 End stage renal disease: Secondary | ICD-10-CM | POA: Diagnosis not present

## 2019-02-03 DIAGNOSIS — Z992 Dependence on renal dialysis: Secondary | ICD-10-CM | POA: Diagnosis not present

## 2019-02-03 DIAGNOSIS — N184 Chronic kidney disease, stage 4 (severe): Secondary | ICD-10-CM | POA: Diagnosis not present

## 2019-02-03 DIAGNOSIS — N2581 Secondary hyperparathyroidism of renal origin: Secondary | ICD-10-CM | POA: Diagnosis not present

## 2019-02-05 DIAGNOSIS — N186 End stage renal disease: Secondary | ICD-10-CM | POA: Diagnosis not present

## 2019-02-05 DIAGNOSIS — N2889 Other specified disorders of kidney and ureter: Secondary | ICD-10-CM | POA: Diagnosis not present

## 2019-02-05 DIAGNOSIS — Z992 Dependence on renal dialysis: Secondary | ICD-10-CM | POA: Diagnosis not present

## 2019-02-06 DIAGNOSIS — N186 End stage renal disease: Secondary | ICD-10-CM | POA: Diagnosis not present

## 2019-02-06 DIAGNOSIS — N184 Chronic kidney disease, stage 4 (severe): Secondary | ICD-10-CM | POA: Diagnosis not present

## 2019-02-06 DIAGNOSIS — D631 Anemia in chronic kidney disease: Secondary | ICD-10-CM | POA: Diagnosis not present

## 2019-02-06 DIAGNOSIS — Z992 Dependence on renal dialysis: Secondary | ICD-10-CM | POA: Diagnosis not present

## 2019-02-06 DIAGNOSIS — N2581 Secondary hyperparathyroidism of renal origin: Secondary | ICD-10-CM | POA: Diagnosis not present

## 2019-02-08 DIAGNOSIS — N184 Chronic kidney disease, stage 4 (severe): Secondary | ICD-10-CM | POA: Diagnosis not present

## 2019-02-08 DIAGNOSIS — N186 End stage renal disease: Secondary | ICD-10-CM | POA: Diagnosis not present

## 2019-02-08 DIAGNOSIS — N2581 Secondary hyperparathyroidism of renal origin: Secondary | ICD-10-CM | POA: Diagnosis not present

## 2019-02-08 DIAGNOSIS — D631 Anemia in chronic kidney disease: Secondary | ICD-10-CM | POA: Diagnosis not present

## 2019-02-08 DIAGNOSIS — Z992 Dependence on renal dialysis: Secondary | ICD-10-CM | POA: Diagnosis not present

## 2019-02-10 DIAGNOSIS — N184 Chronic kidney disease, stage 4 (severe): Secondary | ICD-10-CM | POA: Diagnosis not present

## 2019-02-10 DIAGNOSIS — Z992 Dependence on renal dialysis: Secondary | ICD-10-CM | POA: Diagnosis not present

## 2019-02-10 DIAGNOSIS — N2581 Secondary hyperparathyroidism of renal origin: Secondary | ICD-10-CM | POA: Diagnosis not present

## 2019-02-10 DIAGNOSIS — N186 End stage renal disease: Secondary | ICD-10-CM | POA: Diagnosis not present

## 2019-02-10 DIAGNOSIS — D631 Anemia in chronic kidney disease: Secondary | ICD-10-CM | POA: Diagnosis not present

## 2019-02-13 DIAGNOSIS — D631 Anemia in chronic kidney disease: Secondary | ICD-10-CM | POA: Diagnosis not present

## 2019-02-13 DIAGNOSIS — Z992 Dependence on renal dialysis: Secondary | ICD-10-CM | POA: Diagnosis not present

## 2019-02-13 DIAGNOSIS — N186 End stage renal disease: Secondary | ICD-10-CM | POA: Diagnosis not present

## 2019-02-13 DIAGNOSIS — Z23 Encounter for immunization: Secondary | ICD-10-CM | POA: Diagnosis not present

## 2019-02-13 DIAGNOSIS — N2581 Secondary hyperparathyroidism of renal origin: Secondary | ICD-10-CM | POA: Diagnosis not present

## 2019-02-13 DIAGNOSIS — N184 Chronic kidney disease, stage 4 (severe): Secondary | ICD-10-CM | POA: Diagnosis not present

## 2019-02-15 DIAGNOSIS — N2581 Secondary hyperparathyroidism of renal origin: Secondary | ICD-10-CM | POA: Diagnosis not present

## 2019-02-15 DIAGNOSIS — N184 Chronic kidney disease, stage 4 (severe): Secondary | ICD-10-CM | POA: Diagnosis not present

## 2019-02-15 DIAGNOSIS — D631 Anemia in chronic kidney disease: Secondary | ICD-10-CM | POA: Diagnosis not present

## 2019-02-15 DIAGNOSIS — N186 End stage renal disease: Secondary | ICD-10-CM | POA: Diagnosis not present

## 2019-02-15 DIAGNOSIS — Z992 Dependence on renal dialysis: Secondary | ICD-10-CM | POA: Diagnosis not present

## 2019-02-17 DIAGNOSIS — Z992 Dependence on renal dialysis: Secondary | ICD-10-CM | POA: Diagnosis not present

## 2019-02-17 DIAGNOSIS — N2581 Secondary hyperparathyroidism of renal origin: Secondary | ICD-10-CM | POA: Diagnosis not present

## 2019-02-17 DIAGNOSIS — N184 Chronic kidney disease, stage 4 (severe): Secondary | ICD-10-CM | POA: Diagnosis not present

## 2019-02-17 DIAGNOSIS — N186 End stage renal disease: Secondary | ICD-10-CM | POA: Diagnosis not present

## 2019-02-17 DIAGNOSIS — D631 Anemia in chronic kidney disease: Secondary | ICD-10-CM | POA: Diagnosis not present

## 2019-02-20 DIAGNOSIS — N184 Chronic kidney disease, stage 4 (severe): Secondary | ICD-10-CM | POA: Diagnosis not present

## 2019-02-20 DIAGNOSIS — D631 Anemia in chronic kidney disease: Secondary | ICD-10-CM | POA: Diagnosis not present

## 2019-02-20 DIAGNOSIS — Z992 Dependence on renal dialysis: Secondary | ICD-10-CM | POA: Diagnosis not present

## 2019-02-20 DIAGNOSIS — N2581 Secondary hyperparathyroidism of renal origin: Secondary | ICD-10-CM | POA: Diagnosis not present

## 2019-02-20 DIAGNOSIS — N186 End stage renal disease: Secondary | ICD-10-CM | POA: Diagnosis not present

## 2019-02-22 DIAGNOSIS — D631 Anemia in chronic kidney disease: Secondary | ICD-10-CM | POA: Diagnosis not present

## 2019-02-22 DIAGNOSIS — N2581 Secondary hyperparathyroidism of renal origin: Secondary | ICD-10-CM | POA: Diagnosis not present

## 2019-02-22 DIAGNOSIS — N186 End stage renal disease: Secondary | ICD-10-CM | POA: Diagnosis not present

## 2019-02-22 DIAGNOSIS — Z992 Dependence on renal dialysis: Secondary | ICD-10-CM | POA: Diagnosis not present

## 2019-02-22 DIAGNOSIS — N184 Chronic kidney disease, stage 4 (severe): Secondary | ICD-10-CM | POA: Diagnosis not present

## 2019-02-24 DIAGNOSIS — Z992 Dependence on renal dialysis: Secondary | ICD-10-CM | POA: Diagnosis not present

## 2019-02-24 DIAGNOSIS — N2581 Secondary hyperparathyroidism of renal origin: Secondary | ICD-10-CM | POA: Diagnosis not present

## 2019-02-24 DIAGNOSIS — N184 Chronic kidney disease, stage 4 (severe): Secondary | ICD-10-CM | POA: Diagnosis not present

## 2019-02-24 DIAGNOSIS — D631 Anemia in chronic kidney disease: Secondary | ICD-10-CM | POA: Diagnosis not present

## 2019-02-24 DIAGNOSIS — N186 End stage renal disease: Secondary | ICD-10-CM | POA: Diagnosis not present

## 2019-02-27 DIAGNOSIS — Z992 Dependence on renal dialysis: Secondary | ICD-10-CM | POA: Diagnosis not present

## 2019-02-27 DIAGNOSIS — N186 End stage renal disease: Secondary | ICD-10-CM | POA: Diagnosis not present

## 2019-02-27 DIAGNOSIS — D631 Anemia in chronic kidney disease: Secondary | ICD-10-CM | POA: Diagnosis not present

## 2019-02-27 DIAGNOSIS — N184 Chronic kidney disease, stage 4 (severe): Secondary | ICD-10-CM | POA: Diagnosis not present

## 2019-02-27 DIAGNOSIS — N2581 Secondary hyperparathyroidism of renal origin: Secondary | ICD-10-CM | POA: Diagnosis not present

## 2019-03-01 DIAGNOSIS — N186 End stage renal disease: Secondary | ICD-10-CM | POA: Diagnosis not present

## 2019-03-01 DIAGNOSIS — Z992 Dependence on renal dialysis: Secondary | ICD-10-CM | POA: Diagnosis not present

## 2019-03-01 DIAGNOSIS — N2581 Secondary hyperparathyroidism of renal origin: Secondary | ICD-10-CM | POA: Diagnosis not present

## 2019-03-01 DIAGNOSIS — N184 Chronic kidney disease, stage 4 (severe): Secondary | ICD-10-CM | POA: Diagnosis not present

## 2019-03-01 DIAGNOSIS — D631 Anemia in chronic kidney disease: Secondary | ICD-10-CM | POA: Diagnosis not present

## 2019-03-03 DIAGNOSIS — N186 End stage renal disease: Secondary | ICD-10-CM | POA: Diagnosis not present

## 2019-03-03 DIAGNOSIS — N184 Chronic kidney disease, stage 4 (severe): Secondary | ICD-10-CM | POA: Diagnosis not present

## 2019-03-03 DIAGNOSIS — Z992 Dependence on renal dialysis: Secondary | ICD-10-CM | POA: Diagnosis not present

## 2019-03-03 DIAGNOSIS — N2581 Secondary hyperparathyroidism of renal origin: Secondary | ICD-10-CM | POA: Diagnosis not present

## 2019-03-03 DIAGNOSIS — D631 Anemia in chronic kidney disease: Secondary | ICD-10-CM | POA: Diagnosis not present

## 2019-03-05 DIAGNOSIS — N186 End stage renal disease: Secondary | ICD-10-CM | POA: Diagnosis not present

## 2019-03-05 DIAGNOSIS — N2889 Other specified disorders of kidney and ureter: Secondary | ICD-10-CM | POA: Diagnosis not present

## 2019-03-05 DIAGNOSIS — Z992 Dependence on renal dialysis: Secondary | ICD-10-CM | POA: Diagnosis not present

## 2019-03-06 DIAGNOSIS — Z992 Dependence on renal dialysis: Secondary | ICD-10-CM | POA: Diagnosis not present

## 2019-03-06 DIAGNOSIS — N2581 Secondary hyperparathyroidism of renal origin: Secondary | ICD-10-CM | POA: Diagnosis not present

## 2019-03-06 DIAGNOSIS — N186 End stage renal disease: Secondary | ICD-10-CM | POA: Diagnosis not present

## 2019-03-06 DIAGNOSIS — N184 Chronic kidney disease, stage 4 (severe): Secondary | ICD-10-CM | POA: Diagnosis not present

## 2019-03-06 DIAGNOSIS — D631 Anemia in chronic kidney disease: Secondary | ICD-10-CM | POA: Diagnosis not present

## 2019-03-08 DIAGNOSIS — N186 End stage renal disease: Secondary | ICD-10-CM | POA: Diagnosis not present

## 2019-03-08 DIAGNOSIS — Z992 Dependence on renal dialysis: Secondary | ICD-10-CM | POA: Diagnosis not present

## 2019-03-08 DIAGNOSIS — N2581 Secondary hyperparathyroidism of renal origin: Secondary | ICD-10-CM | POA: Diagnosis not present

## 2019-03-08 DIAGNOSIS — N184 Chronic kidney disease, stage 4 (severe): Secondary | ICD-10-CM | POA: Diagnosis not present

## 2019-03-08 DIAGNOSIS — D631 Anemia in chronic kidney disease: Secondary | ICD-10-CM | POA: Diagnosis not present

## 2019-03-10 DIAGNOSIS — D631 Anemia in chronic kidney disease: Secondary | ICD-10-CM | POA: Diagnosis not present

## 2019-03-10 DIAGNOSIS — Z992 Dependence on renal dialysis: Secondary | ICD-10-CM | POA: Diagnosis not present

## 2019-03-10 DIAGNOSIS — N2581 Secondary hyperparathyroidism of renal origin: Secondary | ICD-10-CM | POA: Diagnosis not present

## 2019-03-10 DIAGNOSIS — N184 Chronic kidney disease, stage 4 (severe): Secondary | ICD-10-CM | POA: Diagnosis not present

## 2019-03-10 DIAGNOSIS — N186 End stage renal disease: Secondary | ICD-10-CM | POA: Diagnosis not present

## 2019-03-13 DIAGNOSIS — N2581 Secondary hyperparathyroidism of renal origin: Secondary | ICD-10-CM | POA: Diagnosis not present

## 2019-03-13 DIAGNOSIS — N184 Chronic kidney disease, stage 4 (severe): Secondary | ICD-10-CM | POA: Diagnosis not present

## 2019-03-13 DIAGNOSIS — N186 End stage renal disease: Secondary | ICD-10-CM | POA: Diagnosis not present

## 2019-03-13 DIAGNOSIS — Z992 Dependence on renal dialysis: Secondary | ICD-10-CM | POA: Diagnosis not present

## 2019-03-13 DIAGNOSIS — D631 Anemia in chronic kidney disease: Secondary | ICD-10-CM | POA: Diagnosis not present

## 2019-03-15 ENCOUNTER — Telehealth: Payer: Self-pay | Admitting: Internal Medicine

## 2019-03-15 DIAGNOSIS — Z992 Dependence on renal dialysis: Secondary | ICD-10-CM | POA: Diagnosis not present

## 2019-03-15 DIAGNOSIS — N2581 Secondary hyperparathyroidism of renal origin: Secondary | ICD-10-CM | POA: Diagnosis not present

## 2019-03-15 DIAGNOSIS — N186 End stage renal disease: Secondary | ICD-10-CM | POA: Diagnosis not present

## 2019-03-15 DIAGNOSIS — D631 Anemia in chronic kidney disease: Secondary | ICD-10-CM | POA: Diagnosis not present

## 2019-03-15 DIAGNOSIS — N184 Chronic kidney disease, stage 4 (severe): Secondary | ICD-10-CM | POA: Diagnosis not present

## 2019-03-15 NOTE — Progress Notes (Signed)
Cardiology Office Note Date:  03/16/2019  Patient ID:  Eric, [name] 06-Mar-1930, MRN [id number] PCP:  Gayland Curry, DO  Cardiologist: Dr. Caryl Comes (last in 2018), L. Servando Snare, NP    Chief Complaint: worsening fatigue  History of Present Illness: Eric [name] is a 86 y.o. male with history of ESRF on HD, HTN >> now with relative hypotension, HLD, GERD, Gout, VHD s/p AVR (bioprosthetic, 2016), CAD (CABG 2016), ICM, chronic CHF (systolic), LBBB  He is listed as DNR  He was hospitalized Oct 2020, with syncope following a dialysis session. Associated with N/V. He was hypotensive and orthostatic. In the ER CXR noted RLL infiltrate. He was seen by speech - recommended to have diet of regular consistency with thin liquids. He was placed on Midodrine and received a course of antibiotics.   He saw L. Gerardt, NP following this hospitalization, he reported always feeling tired (no different) was doing better with HD, BP mildly improved, and suspect we had reached his tolerated maximal medical therapy with his BP now requiring midodrine   He comes today with c/o acutely worsening fatigue and generalized weakness.  He says that he has not felt great since his last hospital stay, though it seem that in the last 2 weeks he is more fatigued, waking SOB in the middle of the [name], nighttime cough.  He denies feeling sick, no symptoms of illness otherwise, no fever that he is aware of. He states he has been doing better with HD, they have been able to complete his sessions and his BP has held with the midodrine. He says they told him initially at the dialysis center that he probably had more fluid on board, but isn't sure if they adjusted his sessions in any way He does not have CP, no palpitations No near syncope or syncope, but will feel weak and have to sit to rest, he does not interpret this as near fainting. He mentions that he used to feel good after HD, able to do errands and feel OK, but can no  longer do this, mentions that he is tired all of the time and never feels like he has energy any more.  Naps at HD.  He does think that the nights that he is waking up SOB are the evening prior to his HD, this is particularly a new symptom   Past Medical History:  Diagnosis Date  . Anxiety   . Arthritis   . Complication of anesthesia    " one time my heart stopped due to a medication that I was on."   . ESRD (end stage renal disease) on dialysis ([place])    Tues, Thursday, Saturday; [facility]; [facility] (10/10/2017)  . GERD (gastroesophageal reflux disease)   . Gout   . Heart murmur    as a teen  . Herpes zoster 04/2012  . Hypertension   . Pneumonia 10/2018  . Shortness of breath dyspnea    with exertion    Past Surgical History:  Procedure Laterality Date  . AORTIC VALVE REPLACEMENT N/A 12/09/2014   Procedure: AORTIC VALVE REPLACEMENT (AVR) WITH 23MM MAGNA EASE BIOPROSTHETIC VALVE;  Surgeon: Gaye Pollack, MD;  Location: [facility];  Service: Open Heart Surgery;  Laterality: N/A;  . APPENDECTOMY  1944  . AV FISTULA PLACEMENT Left    Left Cimino AVF placed in [place]   . AV FISTULA PLACEMENT Right 03/06/2014   Procedure: ARTERIOVENOUS (AV) FISTULA CREATION-right radiocephalic;  Surgeon: Mal Misty,  MD;  Location: [facility];  Service: Vascular;  Laterality: Right;  . AV FISTULA PLACEMENT Right 07/15/2014   Procedure: ARTERIOVENOUS (AV) FISTULA CREATION;  Surgeon: Elam Dutch, MD;  Location: [facility];  Service: Vascular;  Laterality: Right;  . CARDIAC CATHETERIZATION N/A 11/27/2014   Procedure: Right/Left Heart Cath and Coronary Angiography;  Surgeon: Burnell Blanks, MD;  Location: [facility];  Service: Cardiovascular;  Laterality: N/A;  . CATARACT EXTRACTION W/ INTRAOCULAR LENS IMPLANT Bilateral 2014   [facility]  . COLONOSCOPY  2013   Dr. Annamaria Helling [place], [place].  . CORONARY ARTERY BYPASS GRAFT N/A 12/09/2014   Procedure: CORONARY ARTERY BYPASS  GRAFTING (CABG), ON PUMP, TIMES THREE, USING LEFT INTERNAL MAMMARY ARTERY, RIGHT GREATER SAPHENOUS VEIN HARVESTED ENDOSCOPICALLY;  Surgeon: Gaye Pollack, MD;  Location: [facility];  Service: Open Heart Surgery;  Laterality: N/A;  LIMA-LAD; SVG-OM; SVG-PD  . DIALYSIS FISTULA CREATION  08/04/2012 and 10/13   Dr. Harden Mo  . INSERTION OF DIALYSIS CATHETER Right 01-15-14   Right chest TDC placed by Dr. Augustin Coupe at [facility]  . LIGATION OF ARTERIOVENOUS  FISTULA Right 07/15/2014   Procedure: LIGATION OF ARTERIOVENOUS  FISTULA  (RIGHT RADIOCEPHALIC);  Surgeon: Elam Dutch, MD;  Location: [facility];  Service: Vascular;  Laterality: Right;  . LIGATION OF COMPETING BRANCHES OF ARTERIOVENOUS FISTULA Right 05/08/2014   Procedure: LIGATION OF COMPETING BRANCHES OF RIGHT ARM RADIOCEPHALIC ARTERIOVENOUS FISTULA;  Surgeon: Mal Misty, MD;  Location: [place];  Service: Vascular;  Laterality: Right;  . REVISON OF ARTERIOVENOUS FISTULA Right 07/15/2014   Procedure: EXPLORATION OF ARTERIOVENOUS FISTULA;  Surgeon: Elam Dutch, MD;  Location: [place];  Service: Vascular;  Laterality: Right;  . TEE WITHOUT CARDIOVERSION N/A 12/09/2014   Procedure: TRANSESOPHAGEAL ECHOCARDIOGRAM (TEE);  Surgeon: Gaye Pollack, MD;  Location: [facility];  Service: Open Heart Surgery;  Laterality: N/A;    Current Outpatient Medications  Medication Sig Dispense Refill  . metoCLOPramide (REGLAN) 5 MG tablet Take 5 mg by mouth daily.    Marland Kitchen allopurinol (ZYLOPRIM) 100 MG tablet TAKE 1 TABLET BY MOUTH DAILY 90 tablet 3  . atorvastatin (LIPITOR) 20 MG tablet TAKE 1 TABLET BY MOUTH DAILY FOR CHOLESTEROL 90 tablet 3  . calcium carbonate (TUMS - DOSED IN MG ELEMENTAL CALCIUM) 500 MG chewable tablet Chew 2 tablets by mouth 3 (three) times daily with meals.     . cinacalcet (SENSIPAR) 60 MG tablet Take 60 mg by mouth. MondayWednesday, Friday, Sunday    . midodrine (PROAMATINE) 10 MG tablet Take 10 mg by mouth 3 (three) times daily.    Marland Kitchen omeprazole  (PRILOSEC) 20 MG capsule Take 20 mg by mouth daily.      No current facility-administered medications for this visit.    Allergies:   Patient has no known allergies.   Social History:  The patient  reports that he has never smoked. He has never used smokeless tobacco. He reports current alcohol use of about 7.0 standard drinks of alcohol per week. He reports that he does not use drugs.   Family History:  The patient's family history includes Cancer in his mother; Diabetes in his father; Lung cancer (age of onset: 57) in his father.  ROS:  Please see the history of present illness.  All other systems are reviewed and otherwise negative.   PHYSICAL EXAM:  VS:  BP (!) 104/52   Pulse 74   Ht 5\' 9"  (1.753 m)   Wt  163 lb (73.9 kg)   SpO2 96%   BMI 24.07 kg/m  BMI: Body mass index is 24.07 kg/m. Well nourished, well developed, in no acute distress, chronically ill appearing HEENT: normocephalic, atraumatic  Neck: no JVD, carotid bruits or masses Cardiac:  RRR; soft SM, no rubs, or gallops Lungs:  CTA b/l, no wheezing, rhonchi or rales  Abd: soft, nontender MS: no deformity, age appropriate/perhaps advanced atrophy Ext: no edema  Skin: warm and dry, no rash Neuro:  No gross deficits appreciated Psych: euthymic mood, full affect   EKG:  Done today and reviewed by myself shows SR, qst degree AVblock PR 230ms, LBBBB   ECHOIMPRESSIONS5/15/2020  1. The left ventricle has severely reduced systolic function, with an ejection fraction of 20-25%. The cavity size was moderately dilated. Left ventricular diastolic Doppler parameters are indeterminate. There is abnormal septal motion consistent with  left bundle branch block. Left ventricular diffuse hypokinesis. 2. The right ventricle has severely reduced systolic function. The cavity was normal. There is no increase in right ventricular wall thickness. 3. A 69mm an Edwards Magna-Ease valve is present in the aortic position. Procedure  Date: 12/09/14. The leaflets are not well visualized. No evidence of prosthetic or paravalvular regurgitation. Mean systolic gradient 8 mmHg, in the setting of severely  reduced LV systolic function. 4. Left atrial size was moderately dilated. 5. Mitral valve regurgitation is moderate by color flow Doppler. 6. When compared to the prior study: Compared to exam from 06/25/16, the ejection fraction has decreased. Mitral regurgitation may have increased. Side by side comparison of images performed.  06/25/2016: LVEF 35-40% 01/10/2015: LVEF 40-45% 11/25/2014: LVEF 35-40%   11/27/2014: LHC  Mid LAD lesion, 99% stenosed.  Dist LAD lesion, 90% stenosed.  Ost 2nd Diag lesion, 90% stenosed.  Prox LAD to Mid LAD lesion, 80% stenosed.  2nd Mrg lesion, 60% stenosed.  Ost 3rd Mrg lesion, 100% stenosed.  Prox RCA lesion, 95% stenosed.  Mid RCA lesion, 99% stenosed.  Dist RCA lesion, 100% stenosed.   1. Severe triple vessel CAD 2. Severe stenosis in the heavily calcified mid LAD  3. Chronic occlusion OM branch 4. Chronic occlusion distal RCA 5. Ischemic cardiomyopathy with normal filling pressures.  6. Likely moderately severe AS by echo (based on appearance of the valve with restricted leaflet motion, mean gradient over 28mmHg).  Recommendations: His CAD is diffuse and not favorable for PCI. He is a very active 86 yo man and despite ESRD on HD, he is functional. I will refer to CT surgery to discuss CABG with combined AVR  Recent Labs: 11/02/2018: ALT 16 11/04/2018: BUN 45; Creatinine, Ser 5.42; Hemoglobin 10.0; Platelets 161; Potassium 4.2; Sodium 139  No results found for requested labs within last 8760 hours.   CrCl cannot be calculated (Patient's most recent lab result is older than the maximum 21 days allowed.).   Wt Readings from Last 3 Encounters:  03/16/19 163 lb (73.9 kg)  12/06/18 163 lb 12.8 oz (74.3 kg)  11/08/18 169 lb (76.7 kg)     Other studies  reviewed: Additional studies/records reviewed today include: summarized above  ASSESSMENT AND PLAN:  1. CM (presumably ischemic, goes back to time of CABG) 2. Chronic CHF     Volume management with dialysis     I do not appreciate any exam findings of overt volume OL this AM     He describes orthopnea on nights in-between HD  3. CAD     CABG 2016  On statin, no BB with relative and orthostatic hypotension now     No reports of CP     Prior notes have favored conservative management strategy going forward  4. VHD     Bioprosthetic AVR 2016     Echo May 2020 above   5. HTN     Now with relative hypotension and orthostatic changes requiring high dose midodrine   This is my first time meeting Eric [name], I reviewed his chart at length, he had a Geriatrics visit in Jan with what sound much the same complaints.  Though he reported his exertional fatigue and decreased functional capacity as new  given L. Servando Snare, NP knows him well, I discussed his complaints and concerns with her to get a feel for the chronicity or not of his complaints. Much of what he mentions is much the same as she has heard in the past Orthopnea though not a known or recurrent mention.  Margarita Grizzle and myself discussed the difficult situation, and likely multifactorial causes for his general functional decline, weakness and hard balance between taking off enough fluid to keep him form feeling SOB at [name] and not too much to worsen his already low BP's and orthostatic symptoms  I do not appreciate any acute issues that would require hospitalization Would recommend if able with HD to try and take more volume off, I will send my note to Dr. Lorrene Reid as well.  discussed importance of pacing himself, symptom recognition of low BP and safety. He has not fainted since his last hospital stay in Oct last year and tells me he no longer drives   Disposition: F/u with Eric Hess, NP in a couple  months  Current medicines  are reviewed at length with the patient today.  The patient did not have any concerns regarding medicines.  Eric Night, PA-C 03/16/2019 4:35 PM     [address] [place]  [phone] (office)  [phone] (fax)

## 2019-03-15 NOTE — Telephone Encounter (Signed)
Patient is requesting for his wife, Opal Sidles, to come with him to his appt tomorrow with Tommye Standard. He states he may be sent to the hospital and wants her to be there with him if that happens. He also states they have both been vaccinated.

## 2019-03-16 ENCOUNTER — Other Ambulatory Visit: Payer: Self-pay

## 2019-03-16 ENCOUNTER — Ambulatory Visit (INDEPENDENT_AMBULATORY_CARE_PROVIDER_SITE_OTHER): Payer: Medicare Other | Admitting: Physician Assistant

## 2019-03-16 VITALS — BP 104/52 | HR 74 | Ht 69.0 in | Wt 163.0 lb

## 2019-03-16 DIAGNOSIS — I5022 Chronic systolic (congestive) heart failure: Secondary | ICD-10-CM | POA: Diagnosis not present

## 2019-03-16 DIAGNOSIS — I2510 Atherosclerotic heart disease of native coronary artery without angina pectoris: Secondary | ICD-10-CM

## 2019-03-16 DIAGNOSIS — I951 Orthostatic hypotension: Secondary | ICD-10-CM | POA: Diagnosis not present

## 2019-03-16 DIAGNOSIS — I953 Hypotension of hemodialysis: Secondary | ICD-10-CM

## 2019-03-16 DIAGNOSIS — Z952 Presence of prosthetic heart valve: Secondary | ICD-10-CM | POA: Diagnosis not present

## 2019-03-16 DIAGNOSIS — R0602 Shortness of breath: Secondary | ICD-10-CM | POA: Diagnosis not present

## 2019-03-16 DIAGNOSIS — I255 Ischemic cardiomyopathy: Secondary | ICD-10-CM

## 2019-03-16 NOTE — Patient Instructions (Addendum)
Medication Instructions:   Your physician recommends that you continue on your current medications as directed. Please refer to the Current Medication list given to you today.  *If you need a refill on your cardiac medications before your next appointment, please call your pharmacy*   Lab Work: [facility]   If you have labs (blood work) drawn today and your tests are completely normal, you will receive your results only by: Marland Kitchen MyChart Message (if you have MyChart) OR . A paper copy in the mail If you have any lab test that is abnormal or we need to change your treatment, we will call you to review the results.   Testing/Procedures: NONE ORDERED  TODAY   Follow-Up: At [facility], you and your health needs are our priority.  As part of our continuing mission to provide you with exceptional heart care, we have created designated Provider Care Teams.  These Care Teams include your primary Cardiologist (physician) and Advanced Practice Providers (APPs -  Physician Assistants and Nurse Practitioners) who all work together to provide you with the care you need, when you need it.  We recommend signing up for the patient portal called "MyChart".  Sign up information is provided on this After Visit Summary.  MyChart is used to connect with patients for Virtual Visits (Telemedicine).  Patients are able to view lab/test results, encounter notes, upcoming appointments, etc.  Non-urgent messages can be sent to your provider as well.   To learn more about what you can do with MyChart, go to [email or web address].    Your next appointment:   6-8  week(s)  The format for your next appointment:   In Person  Provider:  Truitt Merle NP-C    Other Instructions  WEAR SUPPORT STOCKINGS (THIGH HIGH ) DURING  DAY AND AT NIGHT

## 2019-03-17 DIAGNOSIS — D631 Anemia in chronic kidney disease: Secondary | ICD-10-CM | POA: Diagnosis not present

## 2019-03-17 DIAGNOSIS — N2581 Secondary hyperparathyroidism of renal origin: Secondary | ICD-10-CM | POA: Diagnosis not present

## 2019-03-17 DIAGNOSIS — N184 Chronic kidney disease, stage 4 (severe): Secondary | ICD-10-CM | POA: Diagnosis not present

## 2019-03-17 DIAGNOSIS — N186 End stage renal disease: Secondary | ICD-10-CM | POA: Diagnosis not present

## 2019-03-17 DIAGNOSIS — Z992 Dependence on renal dialysis: Secondary | ICD-10-CM | POA: Diagnosis not present

## 2019-03-19 ENCOUNTER — Other Ambulatory Visit: Payer: Self-pay

## 2019-03-19 ENCOUNTER — Ambulatory Visit (INDEPENDENT_AMBULATORY_CARE_PROVIDER_SITE_OTHER): Payer: Medicare Other | Admitting: Family

## 2019-03-19 ENCOUNTER — Encounter: Payer: Self-pay | Admitting: Family

## 2019-03-19 DIAGNOSIS — Z0000 Encounter for general adult medical examination without abnormal findings: Secondary | ICD-10-CM | POA: Diagnosis not present

## 2019-03-19 NOTE — Progress Notes (Signed)
Subjective:   Eric [name] is a 86 y.o. male who presents for Medicare Annual/Subsequent preventive examination.  Review of Systems:   Cardiac Risk Factors include: advanced age (>71mn, >>29women);hypertension;dyslipidemia;male gender     Objective:    Vitals: There were no vitals taken for this visit.  There is no height or weight on file to calculate BMI.  Advanced Directives 03/19/2019 01/24/2019 11/08/2018 11/01/2018 10/31/2018 02/18/2018 10/10/2017  Does Patient Have a Medical Advance Directive? Yes Yes Yes - No No;Yes Yes  Type of AParamed[place]of [place] will;Out of facility DNR (pink MOST or yellow form) Out of facility DNR (pink MOST or yellow form) [place] will [place]Living will;Out of facility DNR (pink MOST or yellow form)  Does patient want to make changes to medical advance directive? No - Patient declined No - Patient declined No - Patient declined - - - No - Patient declined  Copy of [place] Chart? Yes - validated most recent copy scanned in chart (See row information) - Yes - validated most recent copy scanned in chart (See row information) - - - No - copy requested  Would patient like information on creating a medical advance directive? - - - No - Patient declined No - Patient declined - No - Patient declined  Pre-existing out of facility DNR order (yellow form or pink MOST form) - - - - - - Physician notified to receive inpatient order    Tobacco Social History   Tobacco Use  Smoking Status Never Smoker  Smokeless Tobacco Never Used     Counseling given: Not Answered   Clinical Intake:  Pre-visit preparation completed: No  Pain : No/denies pain     BMI - recorded: 24.19 Nutritional Status: BMI of 19-24  Normal Nutritional Risks: None Diabetes: No  How often do you need to have someone help you when you read  instructions, pamphlets, or other written materials from your doctor or pharmacy?: 1 - Never What is the last grade level you completed in school?: Doctor Degree  Interpreter Needed?: No  Information entered by :: Lizmarie Witters FNP-C  Past Medical History:  Diagnosis Date  . Anxiety   . Arthritis   . Complication of anesthesia    " one time my heart stopped due to a medication that I was on."   . ESRD (end stage renal disease) on dialysis [place]    Tues, Thursday, Saturday; [facility]; [facility])  . GERD (gastroesophageal reflux disease)   . Gout   . Heart murmur    as a teen  . Herpes zoster 04/2012  . Hypertension   . Pneumonia 10/2018  . Shortness of breath dyspnea    with exertion   Past Surgical History:  Procedure Laterality Date  . AORTIC VALVE REPLACEMENT N/A 12/09/2014   Procedure: AORTIC VALVE REPLACEMENT (AVR) WITH 23MM MAGNA EASE BIOPROSTHETIC VALVE;  Surgeon: BGaye Pollack MD;  [facility];  Service: Open Heart Surgery;  Laterality: N/A;  . APPENDECTOMY  1944  . AV FISTULA PLACEMENT Left    [place]  . AV FISTULA PLACEMENT Right 03/06/2014   Procedure: ARTERIOVENOUS (AV) FISTULA CREATION-right radiocephalic;  Surgeon: JMal Misty MD;  [facility]  Service: Vascular;  Laterality: Right;  . AV FISTULA PLACEMENT Right 07/15/2014   Procedure: ARTERIOVENOUS (AV) FISTULA CREATION;  Surgeon: CJessy Oto  Fields, MD;  Location: [facility];  Service: Vascular;  Laterality: Right;  . CARDIAC CATHETERIZATION N/A 11/27/2014   Procedure: Right/Left Heart Cath and Coronary Angiography;  Surgeon: Burnell Blanks, MD;  Location: [facility];  Service: Cardiovascular;  Laterality: N/A;  . CATARACT EXTRACTION W/ INTRAOCULAR LENS IMPLANT Bilateral 2014   [facility]  . COLONOSCOPY  2013   Dr. Annamaria Helling [place], [place].  . CORONARY ARTERY BYPASS GRAFT N/A 12/09/2014   Procedure: CORONARY ARTERY BYPASS GRAFTING  (CABG), ON PUMP, TIMES THREE, USING LEFT INTERNAL MAMMARY ARTERY, RIGHT GREATER SAPHENOUS VEIN HARVESTED ENDOSCOPICALLY;  Surgeon: Gaye Pollack, MD;  Location: [facility];  Service: Open Heart Surgery;  Laterality: N/A;  LIMA-LAD; SVG-OM; SVG-PD  . DIALYSIS FISTULA CREATION  08/04/2012 and 10/13   Dr. Harden Mo  . INSERTION OF DIALYSIS CATHETER Right 01-15-14   Right chest TDC placed by Dr. Augustin Coupe at [facility]  . LIGATION OF ARTERIOVENOUS  FISTULA Right 07/15/2014   Procedure: LIGATION OF ARTERIOVENOUS  FISTULA  (RIGHT RADIOCEPHALIC);  Surgeon: Elam Dutch, MD;  Location: [facility];  Service: Vascular;  Laterality: Right;  . LIGATION OF COMPETING BRANCHES OF ARTERIOVENOUS FISTULA Right 05/08/2014   Procedure: LIGATION OF COMPETING BRANCHES OF RIGHT ARM RADIOCEPHALIC ARTERIOVENOUS FISTULA;  Surgeon: Mal Misty, MD;  Location: [facility];  Service: Vascular;  Laterality: Right;  . REVISON OF ARTERIOVENOUS FISTULA Right 07/15/2014   Procedure: EXPLORATION OF ARTERIOVENOUS FISTULA;  Surgeon: Elam Dutch, MD;  Location: [facility];  Service: Vascular;  Laterality: Right;  . TEE WITHOUT CARDIOVERSION N/A 12/09/2014   Procedure: TRANSESOPHAGEAL ECHOCARDIOGRAM (TEE);  Surgeon: Gaye Pollack, MD;  Location: [facility];  Service: Open Heart Surgery;  Laterality: N/A;   Family History  Problem Relation Age of Onset  . Diabetes Father   . Lung cancer Father 9       non-smoker  . Cancer Mother        multiple myeloma  . Colon cancer Neg Hx    Social History   Socioeconomic History  . Marital status: Married    Spouse name: Jan  . Number of children: 3  . Years of education: 61  . Highest education level: Not on file  Occupational History  . Occupation: retired Designer, fashion/clothing  Tobacco Use  . Smoking status: Never Smoker  . Smokeless tobacco: Never Used  Substance and Sexual Activity  . Alcohol use: Yes    Alcohol/week: 7.0 standard drinks    Types: 3 Glasses of wine, 4 Shots of liquor per week     Comment: 10/10/2017 "one glass a day of either wine or liquor"  . Drug use: No  . Sexual activity: Not on file  Other Topics Concern  . Not on file  Social History Narrative   Patient is Married since 1958. Occupation: Wellsite geologist   Lives in apartment,  Independent Living  section at [facility] since 05/04/2013. Also has a home in [place], [place].   No Smoking history  Alcohol history: 5 drinks/ week   Regular exercise: 3-4 times a week , treadmill   Patient has Advanced planning documents: Living Will, HCPOA         Social Determinants of Health   Financial Resource Strain:   . Difficulty of Paying Living Expenses:   Food Insecurity:   . Worried About Charity fundraiser in the Last Year:   . Arboriculturist in the Last Year:   Transportation Needs:   .  Lack of Transportation (Medical):   Marland Kitchen Lack of Transportation (Non-Medical):   Physical Activity:   . Days of Exercise per Week:   . Minutes of Exercise per Session:   Stress:   . Feeling of Stress :   Social Connections:   . Frequency of Communication with Friends and Family:   . Frequency of Social Gatherings with Friends and Family:   . Attends Religious Services:   . Active Member of Clubs or Organizations:   . Attends Archivist Meetings:   Marland Kitchen Marital Status:     Outpatient Encounter Medications as of 03/19/2019  Medication Sig  . allopurinol (ZYLOPRIM) 100 MG tablet TAKE 1 TABLET BY MOUTH DAILY  . atorvastatin (LIPITOR) 20 MG tablet TAKE 1 TABLET BY MOUTH DAILY FOR CHOLESTEROL  . calcium carbonate (TUMS - DOSED IN MG ELEMENTAL CALCIUM) 500 MG chewable tablet Chew 2 tablets by mouth 3 (three) times daily with meals.   . cinacalcet (SENSIPAR) 60 MG tablet Take 60 mg by mouth. MondayWednesday, Friday, Sunday  . metoCLOPramide (REGLAN) 5 MG tablet Take 5 mg by mouth daily.  . midodrine (PROAMATINE) 10 MG tablet Take 10 mg by mouth 3 (three) times daily.  Marland Kitchen omeprazole (PRILOSEC) 20 MG capsule  Take 20 mg by mouth daily.    No facility-administered encounter medications on file as of 03/19/2019.    Activities of Daily Living In your present state of health, do you have any difficulty performing the following activities: 03/19/2019 11/01/2018  Hearing? N N  Vision? N N  Difficulty concentrating or making decisions? N N  Walking or climbing stairs? N Y  Dressing or bathing? N N  Doing errands, shopping? N N  Preparing Food and eating ? N -  Using the Toilet? N -  In the past six months, have you accidently leaked urine? N -  Comment dialysis patient -  Do you have problems with loss of bowel control? N -  Managing your Medications? N -  Managing your Finances? N -  Housekeeping or managing your Housekeeping? Y -  Comment facility provides housekeeping -  Some recent data might be hidden    Patient Care Team: Gayland Curry, DO as PCP - General (Geriatric Medicine) Jamal Maes, MD as Consulting Physician (Nephrology) Burtis Junes, NP as Nurse Practitioner (Nurse Practitioner)   Assessment:   This is a routine wellness examination for [employer].  Exercise Activities and Dietary recommendations Current Exercise Habits: Home exercise routine, Type of exercise: walking, Time (Minutes): 10, Frequency (Times/Week): 6, Weekly Exercise (Minutes/Week): 60, Intensity: Mild, Exercise limited by: None identified  Goals   None     Fall Risk Fall Risk  03/19/2019 01/24/2019 11/08/2018 07/19/2018 05/17/2018  Falls in the past year? 0 0 1 0 0  Number falls in past yr: 0 0 0 0 0  Injury with Fall? 0 0 0 0 0  Risk for fall due to : - - Impaired balance/gait - -   Is the patient's home free of loose throw rugs in walkways, pet beds, electrical cords, etc?   no      Grab bars in the bathroom? yes      Handrails on the stairs?   no      Adequate lighting?   yes  Depression Screen PHQ 2/9 Scores 03/19/2019 01/24/2019 11/08/2018 07/19/2018  PHQ - 2 Score 0 0 0 0    Cognitive  Function MMSE - Mini Mental State Exam 06/15/2017 05/12/2016 04/30/2015  Orientation to  time _0 Orientation to Place _1 Registration _2 Attention/ Calculation _3 Recall _4 Language- name 2 objects _5 Language- repeat _6 Language- follow 3 step command _7 Language- read & follow direction _8 Write a sentence _9 Copy design _10 Total score _11 6CIT Screen 03/19/2019  What Year? 0 points  What month? 0 points  What time? 0 points  Count back from 20 0 points  Months in reverse 0 points  Repeat phrase 0 points  Total Score 0    Immunization History  Administered Date(s) Administered  . Influenza, High Dose Seasonal PF 10/02/2017  . Influenza-Unspecified 10/04/2012, 10/05/2015, 10/04/2016, 09/05/2018  . Pneumococcal Conjugate-13 04/30/2015  . Pneumococcal Polysaccharide-23 06/04/2008  . Tdap 06/01/2017  . Zoster 01/05/2011    Qualifies for Shingles Vaccine? Has had shingles   Screening Tests Health Maintenance  Topic Date Due  . TETANUS/TDAP  06/02/2027  . INFLUENZA VACCINE  Completed  . PNA vac Low Risk Adult  Completed   Cancer Screenings: Lung: Low Dose CT Chest recommended if Age 60-80 years, 30 pack-year currently smoking OR have quit w/in 15years. Patient does not qualify. Colorectal: Age out   Additional Screenings: Hepatitis C Screening:Low Risk     Plan:  - Due for shingrix vaccine discuss with Dr.Reed.  I have personally reviewed and noted the following in the patient's chart:   . Medical and social history . Use of alcohol, tobacco or illicit drugs  . Current medications and supplements . Functional ability and status . Nutritional status . Physical activity . Advanced directives . List of other physicians . Hospitalizations, surgeries, and ER visits in previous 12 months . Vitals . Screenings to include cognitive, depression, and falls . Referrals and appointments  In addition, I have reviewed  and discussed with patient certain preventive protocols, quality metrics, and best practice recommendations. A written personalized care plan for preventive services as well as general preventive health recommendations were provided to patient.    Sandrea Hughs, NP  03/19/2019

## 2019-03-19 NOTE — Patient Instructions (Signed)
Mr. Eric [name] , Thank you for taking time to come for your Medicare Wellness Visit. I appreciate your ongoing commitment to your health goals. Please review the following plan we discussed and let me know if I can assist you in the future.   Screening recommendations/referrals: Colonoscopy : Aged out  Recommended yearly ophthalmology/optometry visit for glaucoma screening and checkup Recommended yearly dental visit for hygiene and checkup  Vaccinations: Influenza vaccine: Up to date  Pneumococcal vaccine  Up to date  Tdap vaccine  Up to date  Shingles vaccine : discuss new shingles vaccine with Dr.Reed   Advanced directives: Yes   Conditions/risks identified: Advance Age male > 57 yrs,Hypertension,Dyslipidemia,male Gender  Next appointment: 1 year   Preventive Care 84 Years and Older, Male Preventive care refers to lifestyle choices and visits with your health care provider that can promote health and wellness. What does preventive care include?  A yearly physical exam. This is also called an annual well check.  Dental exams once or twice a year.  Routine eye exams. Ask your health care provider how often you should have your eyes checked.  Personal lifestyle choices, including:  Daily care of your teeth and gums.  Regular physical activity.  Eating a healthy diet.  Avoiding tobacco and drug use.  Limiting alcohol use.  Practicing safe sex.  Taking low doses of aspirin every day.  Taking vitamin and mineral supplements as recommended by your health care provider. What happens during an annual well check? The services and screenings done by your health care provider during your annual well check will depend on your age, overall health, lifestyle risk factors, and family history of disease. Counseling  Your health care provider may ask you questions about your:  Alcohol use.  Tobacco use.  Drug use.  Emotional well-being.  Home and relationship  well-being.  Sexual activity.  Eating habits.  History of falls.  Memory and ability to understand (cognition).  Work and work Statistician. Screening  You may have the following tests or measurements:  Height, weight, and BMI.  Blood pressure.  Lipid and cholesterol levels. These may be checked every 5 years, or more frequently if you are over 41 years old.  Skin check.  Lung cancer screening. You may have this screening every year starting at age 34 if you have a 30-pack-year history of smoking and currently smoke or have quit within the past 15 years.  Fecal occult blood test (FOBT) of the stool. You may have this test every year starting at age 35.  Flexible sigmoidoscopy or colonoscopy. You may have a sigmoidoscopy every 5 years or a colonoscopy every 10 years starting at age 75.  Prostate cancer screening. Recommendations will vary depending on your family history and other risks.  Hepatitis C blood test.  Hepatitis B blood test.  Sexually transmitted disease (STD) testing.  Diabetes screening. This is done by checking your blood sugar (glucose) after you have not eaten for a while (fasting). You may have this done every 1-3 years.  Abdominal aortic aneurysm (AAA) screening. You may need this if you are a current or former smoker.  Osteoporosis. You may be screened starting at age 110 if you are at high risk. Talk with your health care provider about your test results, treatment options, and if necessary, the need for more tests. Vaccines  Your health care provider may recommend certain vaccines, such as:  Influenza vaccine. This is recommended every year.  Tetanus, diphtheria, and acellular pertussis (Tdap, Td)  vaccine. You may need a Td booster every 10 years.  Zoster vaccine. You may need this after age 39.  Pneumococcal 13-valent conjugate (PCV13) vaccine. One dose is recommended after age 24.  Pneumococcal polysaccharide (PPSV23) vaccine. One dose is  recommended after age 72. Talk to your health care provider about which screenings and vaccines you need and how often you need them. This information is not intended to replace advice given to you by your health care provider. Make sure you discuss any questions you have with your health care provider. Document Released: 01/17/2015 Document Revised: 09/10/2015 Document Reviewed: 10/22/2014 Elsevier Interactive Patient Education  2017 [employer] Prevention in the Home Falls can cause injuries. They can happen to people of all ages. There are many things you can do to make your home safe and to help prevent falls. What can I do on the outside of my home?  Regularly fix the edges of walkways and driveways and fix any cracks.  Remove anything that might make you trip as you walk through a door, such as a raised step or threshold.  Trim any bushes or trees on the path to your home.  Use bright outdoor lighting.  Clear any walking paths of anything that might make someone trip, such as rocks or tools.  Regularly check to see if handrails are loose or broken. Make sure that both sides of any steps have handrails.  Any raised decks and porches should have guardrails on the edges.  Have any leaves, snow, or ice cleared regularly.  Use sand or salt on walking paths during winter.  Clean up any spills in your garage right away. This includes oil or grease spills. What can I do in the bathroom?  Use night lights.  Install grab bars by the toilet and in the tub and shower. Do not use towel bars as grab bars.  Use non-skid mats or decals in the tub or shower.  If you need to sit down in the shower, use a plastic, non-slip stool.  Keep the floor dry. Clean up any water that spills on the floor as soon as it happens.  Remove soap buildup in the tub or shower regularly.  Attach bath mats securely with double-sided non-slip rug tape.  Do not have throw rugs and other things on  the floor that can make you trip. What can I do in the bedroom?  Use night lights.  Make sure that you have a light by your bed that is easy to reach.  Do not use any sheets or blankets that are too big for your bed. They should not hang down onto the floor.  Have a firm chair that has side arms. You can use this for support while you get dressed.  Do not have throw rugs and other things on the floor that can make you trip. What can I do in the kitchen?  Clean up any spills right away.  Avoid walking on wet floors.  Keep items that you use a lot in easy-to-reach places.  If you need to reach something above you, use a strong step stool that has a grab bar.  Keep electrical cords out of the way.  Do not use floor polish or wax that makes floors slippery. If you must use wax, use non-skid floor wax.  Do not have throw rugs and other things on the floor that can make you trip. What can I do with my stairs?  Do not leave  any items on the stairs.  Make sure that there are handrails on both sides of the stairs and use them. Fix handrails that are broken or loose. Make sure that handrails are as long as the stairways.  Check any carpeting to make sure that it is firmly attached to the stairs. Fix any carpet that is loose or worn.  Avoid having throw rugs at the top or bottom of the stairs. If you do have throw rugs, attach them to the floor with carpet tape.  Make sure that you have a light switch at the top of the stairs and the bottom of the stairs. If you do not have them, ask someone to add them for you. What else can I do to help prevent falls?  Wear shoes that:  Do not have high heels.  Have rubber bottoms.  Are comfortable and fit you well.  Are closed at the toe. Do not wear sandals.  If you use a stepladder:  Make sure that it is fully opened. Do not climb a closed stepladder.  Make sure that both sides of the stepladder are locked into place.  Ask someone to  hold it for you, if possible.  Clearly mark and make sure that you can see:  Any grab bars or handrails.  First and last steps.  Where the edge of each step is.  Use tools that help you move around (mobility aids) if they are needed. These include:  Canes.  Walkers.  Scooters.  Crutches.  Turn on the lights when you go into a dark area. Replace any light bulbs as soon as they burn out.  Set up your furniture so you have a clear path. Avoid moving your furniture around.  If any of your floors are uneven, fix them.  If there are any pets around you, be aware of where they are.  Review your medicines with your doctor. Some medicines can make you feel dizzy. This can increase your chance of falling. Ask your doctor what other things that you can do to help prevent falls. This information is not intended to replace advice given to you by your health care provider. Make sure you discuss any questions you have with your health care provider. Document Released: 10/17/2008 Document Revised: 05/29/2015 Document Reviewed: 01/25/2014 Elsevier Interactive Patient Education  2017 [employer].

## 2019-03-19 NOTE — Progress Notes (Signed)
Patient ID: Eric [name], male   DOB: 1930/12/11, 86 y.o.   MRN: [id number] This service is provided via telemedicine  No vital signs collected/recorded due to the encounter was a telemedicine visit.   Location of patient (ex: home, work):  HOME  Patient consents to a telephone visit:  YES   Location of the provider (ex: office, home):  OFFICE  Name of any referring provider:  TIFFANY REED, DO  Names of all persons participating in the telemedicine service and their role in the encounter:  PATIENT, [name], [place], [place], NP  Time spent on call:  7:34

## 2019-03-20 DIAGNOSIS — D631 Anemia in chronic kidney disease: Secondary | ICD-10-CM | POA: Diagnosis not present

## 2019-03-20 DIAGNOSIS — N184 Chronic kidney disease, stage 4 (severe): Secondary | ICD-10-CM | POA: Diagnosis not present

## 2019-03-20 DIAGNOSIS — N2581 Secondary hyperparathyroidism of renal origin: Secondary | ICD-10-CM | POA: Diagnosis not present

## 2019-03-20 DIAGNOSIS — N186 End stage renal disease: Secondary | ICD-10-CM | POA: Diagnosis not present

## 2019-03-20 DIAGNOSIS — Z992 Dependence on renal dialysis: Secondary | ICD-10-CM | POA: Diagnosis not present

## 2019-03-22 DIAGNOSIS — N186 End stage renal disease: Secondary | ICD-10-CM | POA: Diagnosis not present

## 2019-03-22 DIAGNOSIS — N2581 Secondary hyperparathyroidism of renal origin: Secondary | ICD-10-CM | POA: Diagnosis not present

## 2019-03-22 DIAGNOSIS — Z992 Dependence on renal dialysis: Secondary | ICD-10-CM | POA: Diagnosis not present

## 2019-03-22 DIAGNOSIS — N184 Chronic kidney disease, stage 4 (severe): Secondary | ICD-10-CM | POA: Diagnosis not present

## 2019-03-22 DIAGNOSIS — D631 Anemia in chronic kidney disease: Secondary | ICD-10-CM | POA: Diagnosis not present

## 2019-03-24 DIAGNOSIS — Z992 Dependence on renal dialysis: Secondary | ICD-10-CM | POA: Diagnosis not present

## 2019-03-24 DIAGNOSIS — N2581 Secondary hyperparathyroidism of renal origin: Secondary | ICD-10-CM | POA: Diagnosis not present

## 2019-03-24 DIAGNOSIS — D631 Anemia in chronic kidney disease: Secondary | ICD-10-CM | POA: Diagnosis not present

## 2019-03-24 DIAGNOSIS — N184 Chronic kidney disease, stage 4 (severe): Secondary | ICD-10-CM | POA: Diagnosis not present

## 2019-03-24 DIAGNOSIS — N186 End stage renal disease: Secondary | ICD-10-CM | POA: Diagnosis not present

## 2019-03-27 DIAGNOSIS — D631 Anemia in chronic kidney disease: Secondary | ICD-10-CM | POA: Diagnosis not present

## 2019-03-27 DIAGNOSIS — Z992 Dependence on renal dialysis: Secondary | ICD-10-CM | POA: Diagnosis not present

## 2019-03-27 DIAGNOSIS — N186 End stage renal disease: Secondary | ICD-10-CM | POA: Diagnosis not present

## 2019-03-27 DIAGNOSIS — N184 Chronic kidney disease, stage 4 (severe): Secondary | ICD-10-CM | POA: Diagnosis not present

## 2019-03-27 DIAGNOSIS — N2581 Secondary hyperparathyroidism of renal origin: Secondary | ICD-10-CM | POA: Diagnosis not present

## 2019-03-29 DIAGNOSIS — N186 End stage renal disease: Secondary | ICD-10-CM | POA: Diagnosis not present

## 2019-03-29 DIAGNOSIS — N2581 Secondary hyperparathyroidism of renal origin: Secondary | ICD-10-CM | POA: Diagnosis not present

## 2019-03-29 DIAGNOSIS — D631 Anemia in chronic kidney disease: Secondary | ICD-10-CM | POA: Diagnosis not present

## 2019-03-29 DIAGNOSIS — N184 Chronic kidney disease, stage 4 (severe): Secondary | ICD-10-CM | POA: Diagnosis not present

## 2019-03-29 DIAGNOSIS — Z992 Dependence on renal dialysis: Secondary | ICD-10-CM | POA: Diagnosis not present

## 2019-03-31 DIAGNOSIS — Z992 Dependence on renal dialysis: Secondary | ICD-10-CM | POA: Diagnosis not present

## 2019-03-31 DIAGNOSIS — N184 Chronic kidney disease, stage 4 (severe): Secondary | ICD-10-CM | POA: Diagnosis not present

## 2019-03-31 DIAGNOSIS — N2581 Secondary hyperparathyroidism of renal origin: Secondary | ICD-10-CM | POA: Diagnosis not present

## 2019-03-31 DIAGNOSIS — D631 Anemia in chronic kidney disease: Secondary | ICD-10-CM | POA: Diagnosis not present

## 2019-03-31 DIAGNOSIS — N186 End stage renal disease: Secondary | ICD-10-CM | POA: Diagnosis not present

## 2019-04-03 DIAGNOSIS — Z992 Dependence on renal dialysis: Secondary | ICD-10-CM | POA: Diagnosis not present

## 2019-04-03 DIAGNOSIS — N2581 Secondary hyperparathyroidism of renal origin: Secondary | ICD-10-CM | POA: Diagnosis not present

## 2019-04-03 DIAGNOSIS — D631 Anemia in chronic kidney disease: Secondary | ICD-10-CM | POA: Diagnosis not present

## 2019-04-03 DIAGNOSIS — N184 Chronic kidney disease, stage 4 (severe): Secondary | ICD-10-CM | POA: Diagnosis not present

## 2019-04-03 DIAGNOSIS — N186 End stage renal disease: Secondary | ICD-10-CM | POA: Diagnosis not present

## 2019-04-05 ENCOUNTER — Telehealth (HOSPITAL_COMMUNITY): Payer: Self-pay

## 2019-04-05 DIAGNOSIS — N2581 Secondary hyperparathyroidism of renal origin: Secondary | ICD-10-CM | POA: Diagnosis not present

## 2019-04-05 DIAGNOSIS — D631 Anemia in chronic kidney disease: Secondary | ICD-10-CM | POA: Diagnosis not present

## 2019-04-05 DIAGNOSIS — N2889 Other specified disorders of kidney and ureter: Secondary | ICD-10-CM | POA: Diagnosis not present

## 2019-04-05 DIAGNOSIS — Z992 Dependence on renal dialysis: Secondary | ICD-10-CM | POA: Diagnosis not present

## 2019-04-05 DIAGNOSIS — D509 Iron deficiency anemia, unspecified: Secondary | ICD-10-CM | POA: Diagnosis not present

## 2019-04-05 DIAGNOSIS — N186 End stage renal disease: Secondary | ICD-10-CM | POA: Diagnosis not present

## 2019-04-05 DIAGNOSIS — N184 Chronic kidney disease, stage 4 (severe): Secondary | ICD-10-CM | POA: Diagnosis not present

## 2019-04-05 NOTE — Telephone Encounter (Signed)

## 2019-04-07 DIAGNOSIS — D509 Iron deficiency anemia, unspecified: Secondary | ICD-10-CM | POA: Diagnosis not present

## 2019-04-07 DIAGNOSIS — N186 End stage renal disease: Secondary | ICD-10-CM | POA: Diagnosis not present

## 2019-04-07 DIAGNOSIS — N2581 Secondary hyperparathyroidism of renal origin: Secondary | ICD-10-CM | POA: Diagnosis not present

## 2019-04-07 DIAGNOSIS — D631 Anemia in chronic kidney disease: Secondary | ICD-10-CM | POA: Diagnosis not present

## 2019-04-07 DIAGNOSIS — N184 Chronic kidney disease, stage 4 (severe): Secondary | ICD-10-CM | POA: Diagnosis not present

## 2019-04-07 DIAGNOSIS — Z992 Dependence on renal dialysis: Secondary | ICD-10-CM | POA: Diagnosis not present

## 2019-04-09 ENCOUNTER — Other Ambulatory Visit: Payer: Self-pay

## 2019-04-09 ENCOUNTER — Ambulatory Visit (INDEPENDENT_AMBULATORY_CARE_PROVIDER_SITE_OTHER): Payer: Medicare Other | Admitting: Surgery

## 2019-04-09 ENCOUNTER — Encounter: Payer: Self-pay | Admitting: Surgery

## 2019-04-09 ENCOUNTER — Other Ambulatory Visit (HOSPITAL_COMMUNITY)
Admission: RE | Admit: 2019-04-09 | Discharge: 2019-04-09 | Disposition: A | Discharge status: Home / Self Care | Payer: Medicare Other | Source: Ambulatory Visit | Attending: Surgery | Admitting: Surgery

## 2019-04-09 VITALS — BP 119/72 | HR 73 | Temp 97.2°F | Resp 18 | Ht 69.0 in | Wt 163.0 lb

## 2019-04-09 DIAGNOSIS — Z992 Dependence on renal dialysis: Secondary | ICD-10-CM | POA: Diagnosis not present

## 2019-04-09 DIAGNOSIS — N186 End stage renal disease: Secondary | ICD-10-CM | POA: Diagnosis not present

## 2019-04-09 DIAGNOSIS — Z01812 Encounter for preprocedural laboratory examination: Secondary | ICD-10-CM | POA: Insufficient documentation

## 2019-04-09 DIAGNOSIS — I255 Ischemic cardiomyopathy: Secondary | ICD-10-CM

## 2019-04-09 DIAGNOSIS — Z20822 Contact with and (suspected) exposure to covid-19: Secondary | ICD-10-CM | POA: Insufficient documentation

## 2019-04-09 LAB — SARS CORONAVIRUS 2 (TAT 6-24 HRS): SARS Coronavirus 2: NEGATIVE

## 2019-04-09 NOTE — H&P (View-Only) (Signed)
Vascular and Vein Specialist of [place]  Patient name: Eric [name] MRN: [id number] DOB: 26-Mar-1930 Sex: male   REQUESTING PROVIDER:    Dr. Lorrene Reid   REASON FOR CONSULT:    Dialysis access complication  HISTORY OF PRESENT ILLNESS:   Eric [name] is a 86 y.o. male, who is status post right Brachial cephalic fistula creation in 2016.  He is on dialysis Tuesday Thursday Saturday.  There has been a ulcer on his fistula for an unknown duration.  He also has aneurysmal changes to the fistula.  He does report some episodes of prolonged bleeding.  He is not on any blood thinners.  Patient has a history of coronary artery disease, status post CABG.  He takes a statin for hypercholesterolemia.  He is a non-smoker.  PAST MEDICAL HISTORY    Past Medical History:  Diagnosis Date  . Anxiety   . Arthritis   . Complication of anesthesia    " one time my heart stopped due to a medication that I was on."   . ESRD (end stage renal disease) on dialysis ([place])    Tues, Thursday, Saturday; [facility]; [place] (10/10/2017)  . GERD (gastroesophageal reflux disease)   . Gout   . Heart murmur    as a teen  . Herpes zoster 04/2012  . Hypertension   . Pneumonia 10/2018  . Shortness of breath dyspnea    with exertion     FAMILY HISTORY   Family History  Problem Relation Age of Onset  . Diabetes Father   . Lung cancer Father 46       non-smoker  . Cancer Mother        multiple myeloma  . Colon cancer Neg Hx     SOCIAL HISTORY:   Social History   Socioeconomic History  . Marital status: Married    Spouse name: Jan  . Number of children: 3  . Years of education: 96  . Highest education level: Not on file  Occupational History  . Occupation: retired Designer, fashion/clothing  Tobacco Use  . Smoking status: Never Smoker  . Smokeless tobacco: Never Used  Substance and Sexual Activity  . Alcohol use: Yes    Alcohol/week: 7.0 standard drinks   Types: 3 Glasses of wine, 4 Shots of liquor per week    Comment: 10/10/2017 "one glass a day of either wine or liquor"  . Drug use: No  . Sexual activity: Not on file  Other Topics Concern  . Not on file  Social History Narrative   Patient is Married since 1958. Occupation: Wellsite geologist   Lives in apartment,  Independent Living  section at [facility] since 05/04/2013. Also has a home in [place], [place].   No Smoking history  Alcohol history: 5 drinks/ week   Regular exercise: 3-4 times a week , treadmill   Patient has Advanced planning documents: Living Will, HCPOA         Social Determinants of Health   Financial Resource Strain:   . Difficulty of Paying Living Expenses:   Food Insecurity:   . Worried About Charity fundraiser in the Last Year:   . Arboriculturist in the Last Year:   Transportation Needs:   . Film/video editor (Medical):   Marland Kitchen Lack of Transportation (Non-Medical):   Physical Activity:   . Days of Exercise per Week:   . Minutes of Exercise per Session:   Stress:   . Feeling of Stress :  Social Connections:   . Frequency of Communication with Friends and Family:   . Frequency of Social Gatherings with Friends and Family:   . Attends Religious Services:   . Active Member of Clubs or Organizations:   . Attends Archivist Meetings:   Marland Kitchen Marital Status:   Intimate Partner Violence:   . Fear of Current or Ex-Partner:   . Emotionally Abused:   Marland Kitchen Physically Abused:   . Sexually Abused:     ALLERGIES:    No Known Allergies  CURRENT MEDICATIONS:    Current Outpatient Medications  Medication Sig Dispense Refill  . allopurinol (ZYLOPRIM) 100 MG tablet TAKE 1 TABLET BY MOUTH DAILY 90 tablet 3  . atorvastatin (LIPITOR) 20 MG tablet TAKE 1 TABLET BY MOUTH DAILY FOR CHOLESTEROL 90 tablet 3  . calcium carbonate (TUMS - DOSED IN MG ELEMENTAL CALCIUM) 500 MG chewable tablet Chew 2 tablets by mouth 3 (three) times daily with meals.      . cinacalcet (SENSIPAR) 60 MG tablet Take 60 mg by mouth. MondayWednesday, Friday, Sunday    . metoCLOPramide (REGLAN) 5 MG tablet Take 5 mg by mouth daily.    . midodrine (PROAMATINE) 10 MG tablet Take 10 mg by mouth 3 (three) times daily.    Marland Kitchen omeprazole (PRILOSEC) 20 MG capsule Take 20 mg by mouth daily.      No current facility-administered medications for this visit.    REVIEW OF SYSTEMS:   [X]  denotes positive finding, [ ]  denotes negative finding Cardiac  Comments:  Chest pain or chest pressure:    Shortness of breath upon exertion:    Short of breath when lying flat:    Irregular heart rhythm:        Vascular    Pain in calf, thigh, or hip brought on by ambulation:    Pain in feet at night that wakes you up from your sleep:     Blood clot in your veins:    Leg swelling:         Pulmonary    Oxygen at home:    Productive cough:     Wheezing:         Neurologic    Sudden weakness in arms or legs:     Sudden numbness in arms or legs:     Sudden onset of difficulty speaking or slurred speech:    Temporary loss of vision in one eye:     Problems with dizziness:         Gastrointestinal    Blood in stool:      Vomited blood:         Genitourinary    Burning when urinating:     Blood in urine:        Psychiatric    Major depression:         Hematologic    Bleeding problems:    Problems with blood clotting too easily:        Skin    Rashes or ulcers:        Constitutional    Fever or chills:     PHYSICAL EXAM:   There were no vitals filed for this visit.  GENERAL: The patient is a well-nourished male, in no acute distress. The vital signs are documented above. CARDIAC: There is a regular rate and rhythm.  VASCULAR: Ulcerated area on a proximal small aneurysm of his right brachiocephalic fistula.  Distally there is a larger aneurysm.  There is a thrill,  not a pulse within the fistula PULMONARY: Nonlabored respirations MUSCULOSKELETAL: There are no  major deformities or cyanosis. NEUROLOGIC: No focal weakness or paresthesias are detected. SKIN: There are no ulcers or rashes noted. PSYCHIATRIC: The patient has a normal affect.  STUDIES:   None  ASSESSMENT and PLAN   Ulceration, right brachiocephalic fistula: I discussed with the patient that I would recommend surgical excision of the ulcerated area.  Simultaneously I will perform a fistulogram to evaluate his central venous system to rule out stenosis.  I told him that I would not address the more distal aneurysmal segment of his fistula, as I think addressing both issues would necessitate placing a catheter which I would recommend against.  He understands this.  I scheduled him for this Thursday.  I will try to arrange getting him dialyzed later that afternoon at his clinic.   Leia Alf, MD, FACS [facility] Specialists of [facility] [phone] Pager [phone]

## 2019-04-09 NOTE — Progress Notes (Signed)
Vascular and Vein Specialist of [place]  Patient name: Eric [name] MRN: [id number] DOB: 09/06/1930 Sex: male   REQUESTING PROVIDER:    Dr. Lorrene Reid   REASON FOR CONSULT:    Dialysis access complication  HISTORY OF PRESENT ILLNESS:   Eric [name] is a 86 y.o. male, who is status post right Brachial cephalic fistula creation in 2016.  He is on dialysis Tuesday Thursday Saturday.  There has been a ulcer on his fistula for an unknown duration.  He also has aneurysmal changes to the fistula.  He does report some episodes of prolonged bleeding.  He is not on any blood thinners.  Patient has a history of coronary artery disease, status post CABG.  He takes a statin for hypercholesterolemia.  He is a non-smoker.  PAST MEDICAL HISTORY    Past Medical History:  Diagnosis Date  . Anxiety   . Arthritis   . Complication of anesthesia    " one time my heart stopped due to a medication that I was on."   . ESRD (end stage renal disease) on dialysis ([place])    Tues, Thursday, Saturday; [facility]; [place] (10/10/2017)  . GERD (gastroesophageal reflux disease)   . Gout   . Heart murmur    as a teen  . Herpes zoster 04/2012  . Hypertension   . Pneumonia 10/2018  . Shortness of breath dyspnea    with exertion     FAMILY HISTORY   Family History  Problem Relation Age of Onset  . Diabetes Father   . Lung cancer Father 18       non-smoker  . Cancer Mother        multiple myeloma  . Colon cancer Neg Hx     SOCIAL HISTORY:   Social History   Socioeconomic History  . Marital status: Married    Spouse name: Jan  . Number of children: 3  . Years of education: 23  . Highest education level: Not on file  Occupational History  . Occupation: retired Designer, fashion/clothing  Tobacco Use  . Smoking status: Never Smoker  . Smokeless tobacco: Never Used  Substance and Sexual Activity  . Alcohol use: Yes    Alcohol/week: 7.0 standard drinks   Types: 3 Glasses of wine, 4 Shots of liquor per week    Comment: 10/10/2017 "one glass a day of either wine or liquor"  . Drug use: No  . Sexual activity: Not on file  Other Topics Concern  . Not on file  Social History Narrative   Patient is Married since 1958. Occupation: Wellsite geologist   Lives in apartment,  Independent Living  section at [facility] since 05/04/2013. Also has a home in [place], [place].   No Smoking history  Alcohol history: 5 drinks/ week   Regular exercise: 3-4 times a week , treadmill   Patient has Advanced planning documents: Living Will, HCPOA         Social Determinants of Health   Financial Resource Strain:   . Difficulty of Paying Living Expenses:   Food Insecurity:   . Worried About Charity fundraiser in the Last Year:   . Arboriculturist in the Last Year:   Transportation Needs:   . Film/video editor (Medical):   Marland Kitchen Lack of Transportation (Non-Medical):   Physical Activity:   . Days of Exercise per Week:   . Minutes of Exercise per Session:   Stress:   . Feeling of Stress :  Social Connections:   . Frequency of Communication with Friends and Family:   . Frequency of Social Gatherings with Friends and Family:   . Attends Religious Services:   . Active Member of Clubs or Organizations:   . Attends Archivist Meetings:   Marland Kitchen Marital Status:   Intimate Partner Violence:   . Fear of Current or Ex-Partner:   . Emotionally Abused:   Marland Kitchen Physically Abused:   . Sexually Abused:     ALLERGIES:    No Known Allergies  CURRENT MEDICATIONS:    Current Outpatient Medications  Medication Sig Dispense Refill  . allopurinol (ZYLOPRIM) 100 MG tablet TAKE 1 TABLET BY MOUTH DAILY 90 tablet 3  . atorvastatin (LIPITOR) 20 MG tablet TAKE 1 TABLET BY MOUTH DAILY FOR CHOLESTEROL 90 tablet 3  . calcium carbonate (TUMS - DOSED IN MG ELEMENTAL CALCIUM) 500 MG chewable tablet Chew 2 tablets by mouth 3 (three) times daily with meals.      . cinacalcet (SENSIPAR) 60 MG tablet Take 60 mg by mouth. MondayWednesday, Friday, Sunday    . metoCLOPramide (REGLAN) 5 MG tablet Take 5 mg by mouth daily.    . midodrine (PROAMATINE) 10 MG tablet Take 10 mg by mouth 3 (three) times daily.    Marland Kitchen omeprazole (PRILOSEC) 20 MG capsule Take 20 mg by mouth daily.      No current facility-administered medications for this visit.    REVIEW OF SYSTEMS:   [X]  denotes positive finding, [ ]  denotes negative finding Cardiac  Comments:  Chest pain or chest pressure:    Shortness of breath upon exertion:    Short of breath when lying flat:    Irregular heart rhythm:        Vascular    Pain in calf, thigh, or hip brought on by ambulation:    Pain in feet at night that wakes you up from your sleep:     Blood clot in your veins:    Leg swelling:         Pulmonary    Oxygen at home:    Productive cough:     Wheezing:         Neurologic    Sudden weakness in arms or legs:     Sudden numbness in arms or legs:     Sudden onset of difficulty speaking or slurred speech:    Temporary loss of vision in one eye:     Problems with dizziness:         Gastrointestinal    Blood in stool:      Vomited blood:         Genitourinary    Burning when urinating:     Blood in urine:        Psychiatric    Major depression:         Hematologic    Bleeding problems:    Problems with blood clotting too easily:        Skin    Rashes or ulcers:        Constitutional    Fever or chills:     PHYSICAL EXAM:   There were no vitals filed for this visit.  GENERAL: The patient is a well-nourished male, in no acute distress. The vital signs are documented above. CARDIAC: There is a regular rate and rhythm.  VASCULAR: Ulcerated area on a proximal small aneurysm of his right brachiocephalic fistula.  Distally there is a larger aneurysm.  There is a thrill,  not a pulse within the fistula PULMONARY: Nonlabored respirations MUSCULOSKELETAL: There are no  major deformities or cyanosis. NEUROLOGIC: No focal weakness or paresthesias are detected. SKIN: There are no ulcers or rashes noted. PSYCHIATRIC: The patient has a normal affect.  STUDIES:   None  ASSESSMENT and PLAN   Ulceration, right brachiocephalic fistula: I discussed with the patient that I would recommend surgical excision of the ulcerated area.  Simultaneously I will perform a fistulogram to evaluate his central venous system to rule out stenosis.  I told him that I would not address the more distal aneurysmal segment of his fistula, as I think addressing both issues would necessitate placing a catheter which I would recommend against.  He understands this.  I scheduled him for this Thursday.  I will try to arrange getting him dialyzed later that afternoon at his clinic.   Leia Alf, MD, FACS Vascular and Vein Specialists of [facility] [phone] Pager [phone]

## 2019-04-10 ENCOUNTER — Encounter (HOSPITAL_COMMUNITY): Payer: Self-pay | Admitting: Surgery

## 2019-04-10 ENCOUNTER — Other Ambulatory Visit: Payer: Self-pay

## 2019-04-10 DIAGNOSIS — N184 Chronic kidney disease, stage 4 (severe): Secondary | ICD-10-CM | POA: Diagnosis not present

## 2019-04-10 DIAGNOSIS — D509 Iron deficiency anemia, unspecified: Secondary | ICD-10-CM | POA: Diagnosis not present

## 2019-04-10 DIAGNOSIS — N186 End stage renal disease: Secondary | ICD-10-CM | POA: Diagnosis not present

## 2019-04-10 DIAGNOSIS — Z992 Dependence on renal dialysis: Secondary | ICD-10-CM | POA: Diagnosis not present

## 2019-04-10 DIAGNOSIS — D631 Anemia in chronic kidney disease: Secondary | ICD-10-CM | POA: Diagnosis not present

## 2019-04-10 DIAGNOSIS — N2581 Secondary hyperparathyroidism of renal origin: Secondary | ICD-10-CM | POA: Diagnosis not present

## 2019-04-10 NOTE — Progress Notes (Signed)
Mr. Eric [name] denies chest pain or shortness of breath.  Patient tested negative for Covid 4/5 and has been in quarantine with his wife. Mr and Mrs Eric [name] lives in independent living at [facility].  Mr. Eric [name] will go to dialysis after discharge.  I am asking PA-C for anesthesia to review the chart.

## 2019-04-11 ENCOUNTER — Encounter (HOSPITAL_COMMUNITY): Payer: Self-pay | Admitting: Surgery

## 2019-04-11 DIAGNOSIS — N186 End stage renal disease: Secondary | ICD-10-CM

## 2019-04-11 DIAGNOSIS — T82898A Other specified complication of vascular prosthetic devices, implants and grafts, initial encounter: Secondary | ICD-10-CM

## 2019-04-11 DIAGNOSIS — Z992 Dependence on renal dialysis: Secondary | ICD-10-CM

## 2019-04-11 NOTE — Progress Notes (Signed)
Anesthesia Chart Review: Eric [name]   Case: [id number] Date/Time: 04/12/19 0715   Procedures:      REVISON OF ARTERIOVENOUS FISTULA (Right )     FISTULOGRAM (Right )   Anesthesia type: Choice   Pre-op diagnosis: END STAGE RENAL DISEASE   Location: [facility] OR ROOM 12 / [facility]   Surgeons: Serafina Mitchell, MD      DISCUSSION: Patient is a 86 year old male scheduled for the above procedure. He has a right brachiocephalic AVF being used for hemodialysis that has become aneurysmal with some episodes of prolonged bleeding. The above procedures recommended. Dr. Trula Slade is reportedly arranging for him to have dialysis later that afternoon at his clinic.   History includes never smoker, CAD (s/p CABG/AVR: LIMA-LAD, SVG-OM2, SVG-PDA 12/09/14), murmur/aortic stenosis (s/p AVR using 23 mm pericardial tissue valve 12/09/14, combined with CABG), chronic systolic CHF, LBBB, HTN, ESRD (HD TTS), exertional dyspnea. Prior to 2013, "one time my heart stopped due to a medication that I was on."   Evaluated by Tommye Standard, PA-C with EP on 03/16/19 for fatigue with worsening LVEF with history of syncope 10/2018 following dialysis (hospitalized and was hypotensive, orthostatic with RLL infiltrate on CXR and was started on midodrine and PNA treated with antibiotics). Symptoms felt multifactorial. For now, recommendation for to see if more volume could be taken off during hemodialysis and monitoring symptoms. As of 05/2018, Dr. Caryl Comes had discussed with patient whether to procedure with cardiac device implantation and decision made not to proceed at that time given concern for infection risk.   04/09/19 COVID-19 test negative. Anesthesia team to evaluate on the day of surgery.    VS:  BP Readings from Last 3 Encounters:  04/09/19 119/72  03/16/19 (!) 104/52  01/24/19 122/72   Pulse Readings from Last 3 Encounters:  04/09/19 73  03/16/19 74  01/24/19 70    PROVIDERS: Gayland Curry, DO is PCP Virl Axe, MD is  cardiologist, although most often seen by Truitt Merle, NP and most recently evaluated by Tommye Standard, PA-C on 03/16/19.  phen  Nephrologist is with [facility]: He is for labs on the day of surgery. As of 11/04/18, Cr 5.42, glucose 109, H/H 10/30.9, platelets 161.    IMAGES: CXR 11/02/18: FINDINGS: Prior cardiac valve replacement. Cardiomegaly. No pulmonary venous congestion. Persistent mild right base infiltrate. Persistent mild bibasilar atelectasis. No prominent pleural effusion. No pneumothorax. IMPRESSION: 1. Persistent mild right base infiltrate. Persistent mild bibasilar atelectasis. 2. Prior cardiac valve replacement. Stable cardiomegaly. No pulmonary venous congestion.   EKG: 03/16/19: SR, LAD, LBBB   CV: Echo 05/19/18: IMPRESSIONS  1. The left ventricle has severely reduced systolic function, with an  ejection fraction of 20-25%. The cavity size was moderately dilated. Left  ventricular diastolic Doppler parameters are indeterminate. There is  abnormal septal motion consistent with  left bundle branch block. Left ventricular diffuse hypokinesis.  2. The right ventricle has severely reduced systolic function. The cavity  was normal. There is no increase in right ventricular wall thickness.  3. A 82mm an Edwards Magna-Ease valve is present in the aortic position.  Procedure Date: 12/09/14. The leaflets are not well visualized. No evidence  of prosthetic or paravalvular regurgitation. Mean systolic gradient 8  mmHg, in the setting of severely  reduced LV systolic function.  4. Left atrial size was moderately dilated.  5. Mitral valve regurgitation is moderate by color flow Doppler.  6. When compared to the prior  study: Compared to exam from 06/25/16, the  ejection fraction has decreased. Mitral regurgitation may have increased.  Side by side comparison of images performed.  (According to 05/26/18 note by Dr. Caryl Comes, joint decision not to  proceed with CRT-P given ESRD/AVR and concern for infection and also not significant change in symptomology at that time. Comparison LVEF: 35-40% 06/25/16; 40-45% 01/10/15)   Last cardiac cath was on 11/27/14 prior to CABG/AVR and showed 3V CAD, ischemic CM, moderate-severe AS.   Carotid US 12/06/14:  Summary:  1. Carotid Duplex Evaluation - Mild plaque with a 39% stenosis.    Past Medical History:  Diagnosis Date  . Anxiety   . Arthritis   . Complication of anesthesia    " one time my heart stopped due to a medication that I was on."   . Coronary artery disease   . ESRD (end stage renal disease) on dialysis ([place])    Tues, Thursday, Saturday; [facility]; [place] (10/10/2017)  . GERD (gastroesophageal reflux disease)   . Gout   . Heart murmur    as a teen  . Herpes zoster 04/2012  . Hypertension   . Pneumonia 10/2018  . Shortness of breath dyspnea    with exertion- very little activity    Past Surgical History:  Procedure Laterality Date  . AORTIC VALVE REPLACEMENT N/A 12/09/2014   Procedure: AORTIC VALVE REPLACEMENT (AVR) WITH 23MM MAGNA EASE BIOPROSTHETIC VALVE;  Surgeon: Gaye Pollack, MD;  Location: [place] [facility] OR;  Service: Open Heart Surgery;  Laterality: N/A;  . APPENDECTOMY  1944  . AV FISTULA PLACEMENT Left    Left Cimino AVF placed in [place]   . AV FISTULA PLACEMENT Right 03/06/2014   Procedure: ARTERIOVENOUS (AV) FISTULA CREATION-right radiocephalic;  Surgeon: Mal Misty, MD;  Location: [facility] OR;  Service: Vascular;  Laterality: Right;  . AV FISTULA PLACEMENT Right 07/15/2014   Procedure: ARTERIOVENOUS (AV) FISTULA CREATION;  Surgeon: Elam Dutch, MD;  Location: [place];  Service: Vascular;  Laterality: Right;  . CARDIAC CATHETERIZATION N/A 11/27/2014   Procedure: Right/Left Heart Cath and Coronary Angiography;  Surgeon: Burnell Blanks, MD;  Location: [facility];  Service: Cardiovascular;  Laterality: N/A;  . CATARACT EXTRACTION W/ INTRAOCULAR  LENS IMPLANT Bilateral 2014   [facility]  . COLONOSCOPY  2013   Dr. Annamaria Helling [place], [place].  . CORONARY ARTERY BYPASS GRAFT N/A 12/09/2014   Procedure: CORONARY ARTERY BYPASS GRAFTING (CABG), ON PUMP, TIMES THREE, USING LEFT INTERNAL MAMMARY ARTERY, RIGHT GREATER SAPHENOUS VEIN HARVESTED ENDOSCOPICALLY;  Surgeon: Gaye Pollack, MD;  Location: [facility];  Service: Open Heart Surgery;  Laterality: N/A;  LIMA-LAD; SVG-OM; SVG-PD  . DIALYSIS FISTULA CREATION  08/04/2012 and 10/13   Dr. Harden Mo  . INSERTION OF DIALYSIS CATHETER Right 01-15-14   Right chest TDC placed by Dr. Augustin Coupe at [facility]  . LIGATION OF ARTERIOVENOUS  FISTULA Right 07/15/2014   Procedure: LIGATION OF ARTERIOVENOUS  FISTULA  (RIGHT RADIOCEPHALIC);  Surgeon: Elam Dutch, MD;  Location: [facility];  Service: Vascular;  Laterality: Right;  . LIGATION OF COMPETING BRANCHES OF ARTERIOVENOUS FISTULA Right 05/08/2014   Procedure: LIGATION OF COMPETING BRANCHES OF RIGHT ARM RADIOCEPHALIC ARTERIOVENOUS FISTULA;  Surgeon: Mal Misty, MD;  Location: [place];  Service: Vascular;  Laterality: Right;  . REVISON OF ARTERIOVENOUS FISTULA Right 07/15/2014   Procedure: EXPLORATION OF ARTERIOVENOUS FISTULA;  Surgeon: Elam Dutch, MD;  Location: [place];  Service: Vascular;  Laterality: Right;  . TEE WITHOUT CARDIOVERSION N/A 12/09/2014   Procedure: TRANSESOPHAGEAL ECHOCARDIOGRAM (TEE);  Surgeon: Gaye Pollack, MD;  Location: [place];  Service: Open Heart Surgery;  Laterality: N/A;    MEDICATIONS: No current facility-administered medications for this encounter.   Marland Kitchen acetaminophen (TYLENOL) 500 MG tablet  . allopurinol (ZYLOPRIM) 100 MG tablet  . atorvastatin (LIPITOR) 20 MG tablet  . calcium carbonate (TUMS - DOSED IN MG ELEMENTAL CALCIUM) 500 MG chewable tablet  . cinacalcet (SENSIPAR) 60 MG tablet  . midodrine (PROAMATINE) 10 MG tablet  . omeprazole (PRILOSEC) 20 MG capsule     Myra Gianotti, PA-C Surgical Short  Stay/Anesthesiology [facility] Phone [phone] [facility] Phone [phone] 04/11/2019 2:43 PM

## 2019-04-11 NOTE — Anesthesia Preprocedure Evaluation (Addendum)
Anesthesia Evaluation  Patient identified by MRN, date of birth, ID band Patient awake    Reviewed: Allergy & Precautions, H&P , NPO status , Patient's Chart, lab work & pertinent test results  History of Anesthesia Complications (+) history of anesthetic complications  Airway Mallampati: II  TM Distance: >3 FB Neck ROM: Full    Dental no notable dental hx. (+) Teeth Intact, Dental Advisory Given, Caps   Pulmonary neg pulmonary ROS, shortness of breath and with exertion,    Pulmonary exam normal breath sounds clear to auscultation       Cardiovascular Exercise Tolerance: Good hypertension, + angina with exertion + CAD and +CHF  negative cardio ROS Normal cardiovascular exam+ dysrhythmias Atrial Fibrillation + Valvular Problems/Murmurs  Rhythm:Regular Rate:Normal  Echo 05/19/18: IMPRESSIONS  1. The left ventricle has severely reduced systolic function, with an  ejection fraction of 20-25%. The cavity size was moderately dilated. Left  ventricular diastolic Doppler parameters are indeterminate. There is  abnormal septal motion consistent with  left bundle branch block. Left ventricular diffuse hypokinesis.  2. The right ventricle has severely reduced systolic function. The cavity  was normal. There is no increase in right ventricular wall thickness.  3. A 54m an Edwards Magna-Ease valve is present in the aortic position.  Procedure Date: 12/09/14. The leaflets are not well visualized. No evidence  of prosthetic or paravalvular regurgitation. Mean systolic gradient 8  mmHg, in the setting of severely  reduced LV systolic function.  4. Left atrial size was moderately dilated.  5. Mitral valve regurgitation is moderate by color flow Doppler.  6. When compared to the prior study: Compared to exam from 06/25/16, the  ejection fraction has decreased. Mitral regurgitation may have increased.  Side by side comparison of images  performed.  (According to 05/26/18 note by Dr. KCaryl Comes joint decision not to proceed with CRT-P given ESRD/AVR and concern for infection and also not significant change in symptomology at that time. Comparison LVEF: 35-40% 06/25/16; 40-45% 01/10/15)   Neuro/Psych Anxiety negative neurological ROS  negative psych ROS   GI/Hepatic negative GI ROS, Neg liver ROS, GERD  ,  Endo/Other  negative endocrine ROS  Renal/GU ESRFRenal diseasenegative Renal ROS  negative genitourinary   Musculoskeletal negative musculoskeletal ROS (+) Arthritis , Osteoarthritis,    Abdominal   Peds negative pediatric ROS (+)  Hematology negative hematology ROS (+) Blood dyscrasia, anemia ,   Anesthesia Other Findings   Reproductive/Obstetrics negative OB ROS                          Anesthesia Physical Anesthesia Plan  ASA: IV  Anesthesia Plan: MAC   Post-op Pain Management:    Induction: Intravenous  PONV Risk Score and Plan:   Airway Management Planned: Mask, Natural Airway and Nasal Cannula  Additional Equipment:   Intra-op Plan:   Post-operative Plan:   Informed Consent: I have reviewed the patients History and Physical, chart, labs and discussed the procedure including the risks, benefits and alternatives for the proposed anesthesia with the patient or authorized representative who has indicated his/her understanding and acceptance.       Plan Discussed with: Anesthesiologist, CRNA and Surgeon  Anesthesia Plan Comments: (PAT note written 04/11/2019 by AMyra Gianotti PA-C. SAME DAY WORK-UP   )     Anesthesia Quick Evaluation

## 2019-04-12 ENCOUNTER — Encounter (HOSPITAL_COMMUNITY): Payer: Self-pay | Admitting: Surgery

## 2019-04-12 ENCOUNTER — Encounter (HOSPITAL_COMMUNITY)
Admission: RE | Disposition: A | Discharge status: Home / Self Care | Payer: Self-pay | Source: Home / Self Care | Attending: Surgery

## 2019-04-12 ENCOUNTER — Ambulatory Visit (HOSPITAL_COMMUNITY)
Admission: RE | Admit: 2019-04-12 | Discharge: 2019-04-12 | Disposition: A | Discharge status: Home / Self Care | Payer: Medicare Other | Attending: Surgery | Admitting: Surgery

## 2019-04-12 ENCOUNTER — Ambulatory Visit (HOSPITAL_COMMUNITY): Payer: Medicare Other | Admitting: Vascular Surgery

## 2019-04-12 ENCOUNTER — Ambulatory Visit (HOSPITAL_COMMUNITY): Payer: Medicare Other

## 2019-04-12 ENCOUNTER — Other Ambulatory Visit: Payer: Self-pay

## 2019-04-12 DIAGNOSIS — D631 Anemia in chronic kidney disease: Secondary | ICD-10-CM | POA: Diagnosis not present

## 2019-04-12 DIAGNOSIS — T82590A Other mechanical complication of surgically created arteriovenous fistula, initial encounter: Secondary | ICD-10-CM | POA: Diagnosis not present

## 2019-04-12 DIAGNOSIS — N186 End stage renal disease: Secondary | ICD-10-CM | POA: Diagnosis not present

## 2019-04-12 DIAGNOSIS — I2510 Atherosclerotic heart disease of native coronary artery without angina pectoris: Secondary | ICD-10-CM | POA: Diagnosis not present

## 2019-04-12 DIAGNOSIS — Y841 Kidney dialysis as the cause of abnormal reaction of the patient, or of later complication, without mention of misadventure at the time of the procedure: Secondary | ICD-10-CM | POA: Insufficient documentation

## 2019-04-12 DIAGNOSIS — D509 Iron deficiency anemia, unspecified: Secondary | ICD-10-CM | POA: Diagnosis not present

## 2019-04-12 DIAGNOSIS — N2581 Secondary hyperparathyroidism of renal origin: Secondary | ICD-10-CM | POA: Diagnosis not present

## 2019-04-12 DIAGNOSIS — N184 Chronic kidney disease, stage 4 (severe): Secondary | ICD-10-CM | POA: Diagnosis not present

## 2019-04-12 DIAGNOSIS — Z951 Presence of aortocoronary bypass graft: Secondary | ICD-10-CM | POA: Insufficient documentation

## 2019-04-12 DIAGNOSIS — Z79899 Other long term (current) drug therapy: Secondary | ICD-10-CM | POA: Diagnosis not present

## 2019-04-12 DIAGNOSIS — I5022 Chronic systolic (congestive) heart failure: Secondary | ICD-10-CM | POA: Diagnosis not present

## 2019-04-12 DIAGNOSIS — M109 Gout, unspecified: Secondary | ICD-10-CM | POA: Insufficient documentation

## 2019-04-12 DIAGNOSIS — K219 Gastro-esophageal reflux disease without esophagitis: Secondary | ICD-10-CM | POA: Diagnosis not present

## 2019-04-12 DIAGNOSIS — Z992 Dependence on renal dialysis: Secondary | ICD-10-CM | POA: Insufficient documentation

## 2019-04-12 DIAGNOSIS — T82510A Breakdown (mechanical) of surgically created arteriovenous fistula, initial encounter: Secondary | ICD-10-CM | POA: Insufficient documentation

## 2019-04-12 DIAGNOSIS — E7800 Pure hypercholesterolemia, unspecified: Secondary | ICD-10-CM | POA: Insufficient documentation

## 2019-04-12 DIAGNOSIS — F419 Anxiety disorder, unspecified: Secondary | ICD-10-CM | POA: Insufficient documentation

## 2019-04-12 DIAGNOSIS — I120 Hypertensive chronic kidney disease with stage 5 chronic kidney disease or end stage renal disease: Secondary | ICD-10-CM | POA: Diagnosis not present

## 2019-04-12 DIAGNOSIS — I770 Arteriovenous fistula, acquired: Secondary | ICD-10-CM | POA: Diagnosis not present

## 2019-04-12 DIAGNOSIS — Z419 Encounter for procedure for purposes other than remedying health state, unspecified: Secondary | ICD-10-CM

## 2019-04-12 DIAGNOSIS — I132 Hypertensive heart and chronic kidney disease with heart failure and with stage 5 chronic kidney disease, or end stage renal disease: Secondary | ICD-10-CM | POA: Diagnosis not present

## 2019-04-12 HISTORY — DX: Heart failure, unspecified: I50.9

## 2019-04-12 HISTORY — PX: REVISON OF ARTERIOVENOUS FISTULA: SHX6074

## 2019-04-12 HISTORY — DX: Atherosclerotic heart disease of native coronary artery without angina pectoris: I25.10

## 2019-04-12 HISTORY — PX: FISTULOGRAM: SHX5832

## 2019-04-12 HISTORY — DX: Left bundle-branch block, unspecified: I44.7

## 2019-04-12 LAB — POCT I-STAT, CHEM 8
BUN: 56 mg/dL — ABNORMAL HIGH (ref 8–23)
Calcium, Ion: 1.11 mmol/L — ABNORMAL LOW (ref 1.15–1.40)
Chloride: 100 mmol/L (ref 98–111)
Creatinine, Ser: 9.2 mg/dL — ABNORMAL HIGH (ref 0.61–1.24)
Glucose, Bld: 111 mg/dL — ABNORMAL HIGH (ref 70–99)
HCT: 32 % — ABNORMAL LOW (ref 39.0–52.0)
Hemoglobin: 10.9 g/dL — ABNORMAL LOW (ref 13.0–17.0)
Potassium: 5 mmol/L (ref 3.5–5.1)
Sodium: 136 mmol/L (ref 135–145)
TCO2: 26 mmol/L (ref 22–32)

## 2019-04-12 SURGERY — REVISON OF ARTERIOVENOUS FISTULA
Anesthesia: Monitor Anesthesia Care | Site: Arm Upper | Laterality: Right

## 2019-04-12 MED ORDER — SODIUM CHLORIDE 0.9 % IV SOLN
INTRAVENOUS | Status: DC | PRN
  Administered 2019-04-12: 500 mL

## 2019-04-12 MED ORDER — 0.9 % SODIUM CHLORIDE (POUR BTL) OPTIME
TOPICAL | Status: DC | PRN
  Administered 2019-04-12: 1000 mL

## 2019-04-12 MED ORDER — OXYCODONE HCL 5 MG PO TABS: 5.0000 mg | ORAL_TABLET | Freq: Three times a day (TID) | ORAL | 0 refills | Status: DC | PRN

## 2019-04-12 MED ORDER — SODIUM CHLORIDE 0.9 % IV SOLN: INTRAVENOUS | Status: DC

## 2019-04-12 MED ORDER — SODIUM CHLORIDE 0.9 % IV SOLN
INTRAVENOUS | Status: AC
  Filled 2019-04-12: qty 1.2

## 2019-04-12 MED ORDER — PHENYLEPHRINE 40 MCG/ML (10ML) SYRINGE FOR IV PUSH (FOR BLOOD PRESSURE SUPPORT)
PREFILLED_SYRINGE | INTRAVENOUS | Status: AC
  Filled 2019-04-12: qty 10

## 2019-04-12 MED ORDER — IODIXANOL 320 MG/ML IV SOLN
INTRAVENOUS | Status: DC | PRN
  Administered 2019-04-12: 18 mL via INTRAVENOUS

## 2019-04-12 MED ORDER — FENTANYL CITRATE (PF) 250 MCG/5ML IJ SOLN
INTRAMUSCULAR | Status: AC
  Filled 2019-04-12: qty 5

## 2019-04-12 MED ORDER — PROPOFOL 500 MG/50ML IV EMUL
INTRAVENOUS | Status: DC | PRN
  Administered 2019-04-12: 50 ug/kg/min via INTRAVENOUS

## 2019-04-12 MED ORDER — ONDANSETRON HCL 4 MG/2ML IJ SOLN
INTRAMUSCULAR | Status: DC | PRN
  Administered 2019-04-12: 4 mg via INTRAVENOUS

## 2019-04-12 MED ORDER — ONDANSETRON HCL 4 MG/2ML IJ SOLN: 4.0000 mg | Freq: Once | INTRAMUSCULAR | Status: DC | PRN

## 2019-04-12 MED ORDER — PROPOFOL 10 MG/ML IV BOLUS
INTRAVENOUS | Status: AC
  Filled 2019-04-12: qty 20

## 2019-04-12 MED ORDER — OXYCODONE HCL 5 MG PO TABS: 5.0000 mg | ORAL_TABLET | Freq: Once | ORAL | Status: DC | PRN

## 2019-04-12 MED ORDER — CEFAZOLIN SODIUM-DEXTROSE 2-4 GM/100ML-% IV SOLN
2.0000 g | INTRAVENOUS | Status: AC
  Administered 2019-04-12: 08:00:00 2 g via INTRAVENOUS
  Filled 2019-04-12: qty 100

## 2019-04-12 MED ORDER — LIDOCAINE-EPINEPHRINE 1 %-1:100000 IJ SOLN
INTRAMUSCULAR | Status: DC | PRN
  Administered 2019-04-12: 3 mL

## 2019-04-12 MED ORDER — CHLORHEXIDINE GLUCONATE 4 % EX LIQD: 60.0000 mL | Freq: Once | CUTANEOUS | Status: DC

## 2019-04-12 MED ORDER — PROPOFOL 10 MG/ML IV BOLUS
INTRAVENOUS | Status: DC | PRN
  Administered 2019-04-12: 10 mg via INTRAVENOUS

## 2019-04-12 MED ORDER — ACETAMINOPHEN 325 MG PO TABS: 325.0000 mg | ORAL_TABLET | ORAL | Status: DC | PRN

## 2019-04-12 MED ORDER — LIDOCAINE HCL (CARDIAC) PF 100 MG/5ML IV SOSY
PREFILLED_SYRINGE | INTRAVENOUS | Status: DC | PRN
  Administered 2019-04-12: 80 mg via INTRAVENOUS

## 2019-04-12 MED ORDER — PHENYLEPHRINE HCL-NACL 10-0.9 MG/250ML-% IV SOLN
INTRAVENOUS | Status: DC | PRN
  Administered 2019-04-12: 20 ug/min via INTRAVENOUS

## 2019-04-12 MED ORDER — FENTANYL CITRATE (PF) 100 MCG/2ML IJ SOLN
INTRAMUSCULAR | Status: DC | PRN
  Administered 2019-04-12 (×2): 25 ug via INTRAVENOUS

## 2019-04-12 MED ORDER — MEPERIDINE HCL 25 MG/ML IJ SOLN: 6.2500 mg | INTRAMUSCULAR | Status: DC | PRN

## 2019-04-12 MED ORDER — LIDOCAINE-EPINEPHRINE 1 %-1:100000 IJ SOLN
INTRAMUSCULAR | Status: AC
  Filled 2019-04-12: qty 1

## 2019-04-12 MED ORDER — FENTANYL CITRATE (PF) 100 MCG/2ML IJ SOLN: 25.0000 ug | INTRAMUSCULAR | Status: DC | PRN

## 2019-04-12 MED ORDER — OXYCODONE HCL 5 MG/5ML PO SOLN: 5.0000 mg | Freq: Once | ORAL | Status: DC | PRN

## 2019-04-12 MED ORDER — ONDANSETRON HCL 4 MG/2ML IJ SOLN
INTRAMUSCULAR | Status: AC
  Filled 2019-04-12: qty 2

## 2019-04-12 MED ORDER — SODIUM CHLORIDE 0.9 % IV SOLN: INTRAVENOUS | Status: DC | PRN

## 2019-04-12 MED ORDER — ACETAMINOPHEN 160 MG/5ML PO SOLN: 325.0000 mg | ORAL | Status: DC | PRN

## 2019-04-12 SURGICAL SUPPLY — 53 items
ARMBAND PINK RESTRICT EXTREMIT (MISCELLANEOUS) ×3 IMPLANT
BAG BANDED W/RUBBER/TAPE 36X54 (MISCELLANEOUS) IMPLANT
BALLN MUSTANG 8.0X40 75 (BALLOONS) ×3
BALLOON MUSTANG 8.0X40 75 (BALLOONS) ×2 IMPLANT
CANISTER SUCT 3000ML PPV (MISCELLANEOUS) ×3 IMPLANT
CHLORAPREP W/TINT 10.5 ML (MISCELLANEOUS) IMPLANT
CLIP VESOCCLUDE MED 6/CT (CLIP) ×3 IMPLANT
CLIP VESOCCLUDE SM WIDE 6/CT (CLIP) ×3 IMPLANT
COVER DOME SNAP 22 D (MISCELLANEOUS) IMPLANT
COVER PROBE W GEL 5X96 (DRAPES) IMPLANT
COVER WAND RF STERILE (DRAPES) IMPLANT
DERMABOND ADVANCED (GAUZE/BANDAGES/DRESSINGS) ×1
DERMABOND ADVANCED .7 DNX12 (GAUZE/BANDAGES/DRESSINGS) ×2 IMPLANT
DRAPE BRACHIAL (DRAPES) IMPLANT
DRAPE C-ARM 42X72 X-RAY (DRAPES) ×3 IMPLANT
DRSG TEGADERM 4X4.75 (GAUZE/BANDAGES/DRESSINGS) ×3 IMPLANT
ELECT REM PT RETURN 9FT ADLT (ELECTROSURGICAL) ×3
ELECTRODE REM PT RTRN 9FT ADLT (ELECTROSURGICAL) ×2 IMPLANT
GEL ULTRASOUND 8.5O AQUASONIC (MISCELLANEOUS) IMPLANT
GLOVE BIOGEL PI IND STRL 7.5 (GLOVE) ×2 IMPLANT
GLOVE BIOGEL PI IND STRL 8 (GLOVE) ×2 IMPLANT
GLOVE BIOGEL PI INDICATOR 7.5 (GLOVE) ×1
GLOVE BIOGEL PI INDICATOR 8 (GLOVE) ×1
GLOVE SURG SS PI 7.5 STRL IVOR (GLOVE) ×3 IMPLANT
GOWN STRL REUS W/ TWL LRG LVL3 (GOWN DISPOSABLE) ×6 IMPLANT
GOWN STRL REUS W/ TWL XL LVL3 (GOWN DISPOSABLE) ×2 IMPLANT
GOWN STRL REUS W/TWL LRG LVL3 (GOWN DISPOSABLE) ×3
GOWN STRL REUS W/TWL XL LVL3 (GOWN DISPOSABLE) ×1
HEMOSTAT SNOW SURGICEL 2X4 (HEMOSTASIS) IMPLANT
KIT BASIN OR (CUSTOM PROCEDURE TRAY) ×3 IMPLANT
KIT ENCORE 26 ADVANTAGE (KITS) IMPLANT
KIT TURNOVER KIT B (KITS) ×3 IMPLANT
NEEDLE PERC 18GX7CM (NEEDLE) IMPLANT
NS IRRIG 1000ML POUR BTL (IV SOLUTION) ×3 IMPLANT
PACK CV ACCESS (CUSTOM PROCEDURE TRAY) ×3 IMPLANT
PACK ENDO MINOR (CUSTOM PROCEDURE TRAY) IMPLANT
PAD ARMBOARD 7.5X6 YLW CONV (MISCELLANEOUS) ×6 IMPLANT
SET MICROPUNCTURE 5F STIFF (MISCELLANEOUS) ×6 IMPLANT
SHEATH BRITE TIP 7FRX11 (SHEATH) ×3 IMPLANT
STOPCOCK MORSE 400PSI 3WAY (MISCELLANEOUS) ×3 IMPLANT
SUT PROLENE 5 0 C 1 24 (SUTURE) ×6 IMPLANT
SUT PROLENE 6 0 CC (SUTURE) ×3 IMPLANT
SUT VIC AB 3-0 SH 27 (SUTURE) ×1
SUT VIC AB 3-0 SH 27X BRD (SUTURE) ×2 IMPLANT
SUT VICRYL 4-0 PS2 18IN ABS (SUTURE) IMPLANT
SYR 10ML LL (SYRINGE) ×9 IMPLANT
SYR 20ML LL LF (SYRINGE) ×6 IMPLANT
SYR CONTROL 10ML LL (SYRINGE) ×3 IMPLANT
TOWEL GREEN STERILE (TOWEL DISPOSABLE) ×3 IMPLANT
TUBING CIL FLEX 10 FLL-RA (TUBING) ×3 IMPLANT
UNDERPAD 30X30 (UNDERPADS AND DIAPERS) ×3 IMPLANT
WATER STERILE IRR 1000ML POUR (IV SOLUTION) ×3 IMPLANT
WIRE BENTSON .035X145CM (WIRE) ×3 IMPLANT

## 2019-04-12 NOTE — Transfer of Care (Signed)
Immediate Anesthesia Transfer of Care Note  Patient: Eric [name]  Procedure(s) Performed: REVISON OF ARTERIOVENOUS FISTULA (Right Arm Upper) FISTULOGRAM with Balloon Venoplasty of Right Peripheral Vein (Right )  Patient Location: PACU  Anesthesia Type:MAC  Level of Consciousness: awake, alert  and oriented  Airway & Oxygen Therapy: Patient Spontanous Breathing and Patient connected to nasal cannula oxygen  Post-op Assessment: Report given to RN and Post -op Vital signs reviewed and stable  Post vital signs: Reviewed and stable  Last Vitals:  Vitals Value Taken Time  BP 94/48 04/12/19 0900  Temp    Pulse 60 04/12/19 0902  Resp 19 04/12/19 0902  SpO2 100 % 04/12/19 0902  Vitals shown include unvalidated device data.  Last Pain:  Vitals:   04/12/19 0637  TempSrc:   PainSc: 0-No pain         Complications: No apparent anesthesia complications

## 2019-04-12 NOTE — Interval H&P Note (Signed)
History and Physical Interval Note:  04/12/2019 7:28 AM  Eric [name]  has presented today for surgery, with the diagnosis of END STAGE RENAL DISEASE.  The various methods of treatment have been discussed with the patient and family. After consideration of risks, benefits and other options for treatment, the patient has consented to  Procedure(s): REVISON OF ARTERIOVENOUS FISTULA (Right) FISTULOGRAM (Right) as a surgical intervention.  The patient's history has been reviewed, patient examined, no change in status, stable for surgery.  I have reviewed the patient's chart and labs.  Questions were answered to the patient's satisfaction.     [name]

## 2019-04-12 NOTE — Anesthesia Procedure Notes (Signed)
Procedure Name: MAC Date/Time: 04/12/2019 7:35 AM Performed by: Inda Coke, CRNA Pre-anesthesia Checklist: Patient identified, Emergency Drugs available, Suction available, Timeout performed and Patient being monitored Patient Re-evaluated:Patient Re-evaluated prior to induction Oxygen Delivery Method: Nasal cannula Induction Type: IV induction Dental Injury: Teeth and Oropharynx as per pre-operative assessment

## 2019-04-12 NOTE — Op Note (Signed)
    Patient name: Ali Mohl MRN: [id number] DOB: 23-Dec-1930 Sex: male  04/12/2019 Pre-operative Diagnosis: ulcerated right arm fistula Post-operative diagnosis:  Same Surgeon:  [name] Procedure:   #1:  Plication of right arm fistula aneurysm with resection of overlying ulcerated skin   #2:  fistulogram   #3:  venoplasty of cephalic vein (peripheral vein) Anesthesia:  MAC Blood Loss:  miimal Specimens:  none  Findings:  68% cephalic vein stenosis dialted with 61m baloon  Indications: This is an 86year old gentleman with an ulcerated area on his right upper arm fistula.  He had 2 areas of aneurysmal change, only 1 had an ulceration.  We discussed treating the ulcerated aneurysm and addressing the second aneurysm at a later date so that he would not require a catheter for dialysis.  Procedure:  The patient was identified in the holding area and [place]  The patient was then placed supine on the table. MAC anesthesia was administered.  The patient was prepped and draped in the usual sterile fashion.  A time out was called and antibiotics were administered.  1% local anesthesia was used.  An elliptical incision was made incorporating the ulcerated part of the fistula.  Cautery was used to circumferentially exposed the fistula and its associated aneurysm.  Once proximal and distal control were obtained, a micropuncture needle was used to cannulate the fistula.  A micropuncture sheath was then inserted and a fistulogram was performed.  This showed a patent central venous system.  The patient did have a 70% stenosis within the cephalic vein arch.  The other visualized portions of the fistula are widely patent.  Next, a Bentson wire was inserted followed by a 7 FPakistansheath.  I then performed venoplasty of the stenotic area within the cephalic vein using an 8 x 40 Mustang balloon.  The balloon was taken to nominal pressure.  Completion imaging showed resolution of the stenosis, now  less than 10%.  The sheath and wire were removed and the fistula was occluded with baby Gregory clamps.  I then resected the ulcerated area and approximately 50% of the aneurysmal segment of the fistula.  I then closed the fistula primarily with a running 5-0 Prolene suture in 2 layers.  Prior to completion the appropriate flushing maneuvers were performed and the closure was completed.  The clamps were released and there was an excellent thrill within the fistula.  Hemostasis was then achieved.  The subcutaneous tissue was then reapproximated with 3-0 Vicryl and the skin was closed with 3-0 Vicryl followed by Dermabond.  There were no immediate complications.   Disposition: To PACU stable.   VTheotis Burrow M.D., FFaxton-St. Luke'S Healthcare - St. Luke'S CampusVascular and Vein Specialists of GCourtlandOffice: [phone]:  [phone]

## 2019-04-12 NOTE — Anesthesia Postprocedure Evaluation (Signed)
Anesthesia Post Note  Patient: [name]  Procedure(s) Performed: REVISON OF ARTERIOVENOUS FISTULA (Right Arm Upper) FISTULOGRAM with Balloon Venoplasty of Right Peripheral Vein (Right )     Patient location during evaluation: PACU Anesthesia Type: MAC Level of consciousness: awake and alert Pain management: pain level controlled Vital Signs Assessment: post-procedure vital signs reviewed and stable Respiratory status: spontaneous breathing, nonlabored ventilation, respiratory function stable and patient connected to nasal cannula oxygen Cardiovascular status: stable and blood pressure returned to baseline Postop Assessment: no apparent nausea or vomiting Anesthetic complications: no    Last Vitals:  Vitals:   04/12/19 0900 04/12/19 0915  BP: (!) 94/48 (!) 107/57  Pulse: 61 61  Resp: 10 18  Temp: 36.5 C 36.5 C  SpO2: 100% 100%    Last Pain:  Vitals:   04/12/19 0915  TempSrc:   PainSc: 0-No pain                 Jerrilynn Mikowski

## 2019-04-14 DIAGNOSIS — N184 Chronic kidney disease, stage 4 (severe): Secondary | ICD-10-CM | POA: Diagnosis not present

## 2019-04-14 DIAGNOSIS — Z992 Dependence on renal dialysis: Secondary | ICD-10-CM | POA: Diagnosis not present

## 2019-04-14 DIAGNOSIS — N186 End stage renal disease: Secondary | ICD-10-CM | POA: Diagnosis not present

## 2019-04-14 DIAGNOSIS — N2581 Secondary hyperparathyroidism of renal origin: Secondary | ICD-10-CM | POA: Diagnosis not present

## 2019-04-14 DIAGNOSIS — D509 Iron deficiency anemia, unspecified: Secondary | ICD-10-CM | POA: Diagnosis not present

## 2019-04-14 DIAGNOSIS — D631 Anemia in chronic kidney disease: Secondary | ICD-10-CM | POA: Diagnosis not present

## 2019-04-17 DIAGNOSIS — N186 End stage renal disease: Secondary | ICD-10-CM | POA: Diagnosis not present

## 2019-04-17 DIAGNOSIS — Z992 Dependence on renal dialysis: Secondary | ICD-10-CM | POA: Diagnosis not present

## 2019-04-17 DIAGNOSIS — D509 Iron deficiency anemia, unspecified: Secondary | ICD-10-CM | POA: Diagnosis not present

## 2019-04-17 DIAGNOSIS — N2581 Secondary hyperparathyroidism of renal origin: Secondary | ICD-10-CM | POA: Diagnosis not present

## 2019-04-17 DIAGNOSIS — N184 Chronic kidney disease, stage 4 (severe): Secondary | ICD-10-CM | POA: Diagnosis not present

## 2019-04-17 DIAGNOSIS — D631 Anemia in chronic kidney disease: Secondary | ICD-10-CM | POA: Diagnosis not present

## 2019-04-19 DIAGNOSIS — Z992 Dependence on renal dialysis: Secondary | ICD-10-CM | POA: Diagnosis not present

## 2019-04-19 DIAGNOSIS — D631 Anemia in chronic kidney disease: Secondary | ICD-10-CM | POA: Diagnosis not present

## 2019-04-19 DIAGNOSIS — N2581 Secondary hyperparathyroidism of renal origin: Secondary | ICD-10-CM | POA: Diagnosis not present

## 2019-04-19 DIAGNOSIS — D509 Iron deficiency anemia, unspecified: Secondary | ICD-10-CM | POA: Diagnosis not present

## 2019-04-19 DIAGNOSIS — N186 End stage renal disease: Secondary | ICD-10-CM | POA: Diagnosis not present

## 2019-04-19 DIAGNOSIS — N184 Chronic kidney disease, stage 4 (severe): Secondary | ICD-10-CM | POA: Diagnosis not present

## 2019-04-21 DIAGNOSIS — D509 Iron deficiency anemia, unspecified: Secondary | ICD-10-CM | POA: Diagnosis not present

## 2019-04-21 DIAGNOSIS — Z992 Dependence on renal dialysis: Secondary | ICD-10-CM | POA: Diagnosis not present

## 2019-04-21 DIAGNOSIS — N2581 Secondary hyperparathyroidism of renal origin: Secondary | ICD-10-CM | POA: Diagnosis not present

## 2019-04-21 DIAGNOSIS — N184 Chronic kidney disease, stage 4 (severe): Secondary | ICD-10-CM | POA: Diagnosis not present

## 2019-04-21 DIAGNOSIS — N186 End stage renal disease: Secondary | ICD-10-CM | POA: Diagnosis not present

## 2019-04-21 DIAGNOSIS — D631 Anemia in chronic kidney disease: Secondary | ICD-10-CM | POA: Diagnosis not present

## 2019-04-24 ENCOUNTER — Encounter: Payer: Medicare Other | Admitting: Vascular Surgery

## 2019-04-24 DIAGNOSIS — Z992 Dependence on renal dialysis: Secondary | ICD-10-CM | POA: Diagnosis not present

## 2019-04-24 DIAGNOSIS — D509 Iron deficiency anemia, unspecified: Secondary | ICD-10-CM | POA: Diagnosis not present

## 2019-04-24 DIAGNOSIS — N184 Chronic kidney disease, stage 4 (severe): Secondary | ICD-10-CM | POA: Diagnosis not present

## 2019-04-24 DIAGNOSIS — D631 Anemia in chronic kidney disease: Secondary | ICD-10-CM | POA: Diagnosis not present

## 2019-04-24 DIAGNOSIS — N186 End stage renal disease: Secondary | ICD-10-CM | POA: Diagnosis not present

## 2019-04-24 DIAGNOSIS — N2581 Secondary hyperparathyroidism of renal origin: Secondary | ICD-10-CM | POA: Diagnosis not present

## 2019-04-26 DIAGNOSIS — D509 Iron deficiency anemia, unspecified: Secondary | ICD-10-CM | POA: Diagnosis not present

## 2019-04-26 DIAGNOSIS — N186 End stage renal disease: Secondary | ICD-10-CM | POA: Diagnosis not present

## 2019-04-26 DIAGNOSIS — D631 Anemia in chronic kidney disease: Secondary | ICD-10-CM | POA: Diagnosis not present

## 2019-04-26 DIAGNOSIS — Z992 Dependence on renal dialysis: Secondary | ICD-10-CM | POA: Diagnosis not present

## 2019-04-26 DIAGNOSIS — N2581 Secondary hyperparathyroidism of renal origin: Secondary | ICD-10-CM | POA: Diagnosis not present

## 2019-04-26 DIAGNOSIS — N184 Chronic kidney disease, stage 4 (severe): Secondary | ICD-10-CM | POA: Diagnosis not present

## 2019-04-28 DIAGNOSIS — N2581 Secondary hyperparathyroidism of renal origin: Secondary | ICD-10-CM | POA: Diagnosis not present

## 2019-04-28 DIAGNOSIS — N184 Chronic kidney disease, stage 4 (severe): Secondary | ICD-10-CM | POA: Diagnosis not present

## 2019-04-28 DIAGNOSIS — Z992 Dependence on renal dialysis: Secondary | ICD-10-CM | POA: Diagnosis not present

## 2019-04-28 DIAGNOSIS — N186 End stage renal disease: Secondary | ICD-10-CM | POA: Diagnosis not present

## 2019-04-28 DIAGNOSIS — D509 Iron deficiency anemia, unspecified: Secondary | ICD-10-CM | POA: Diagnosis not present

## 2019-04-28 DIAGNOSIS — D631 Anemia in chronic kidney disease: Secondary | ICD-10-CM | POA: Diagnosis not present

## 2019-05-01 DIAGNOSIS — D631 Anemia in chronic kidney disease: Secondary | ICD-10-CM | POA: Diagnosis not present

## 2019-05-01 DIAGNOSIS — N186 End stage renal disease: Secondary | ICD-10-CM | POA: Diagnosis not present

## 2019-05-01 DIAGNOSIS — D509 Iron deficiency anemia, unspecified: Secondary | ICD-10-CM | POA: Diagnosis not present

## 2019-05-01 DIAGNOSIS — Z992 Dependence on renal dialysis: Secondary | ICD-10-CM | POA: Diagnosis not present

## 2019-05-01 DIAGNOSIS — N2581 Secondary hyperparathyroidism of renal origin: Secondary | ICD-10-CM | POA: Diagnosis not present

## 2019-05-01 DIAGNOSIS — N184 Chronic kidney disease, stage 4 (severe): Secondary | ICD-10-CM | POA: Diagnosis not present

## 2019-05-03 DIAGNOSIS — N186 End stage renal disease: Secondary | ICD-10-CM | POA: Diagnosis not present

## 2019-05-03 DIAGNOSIS — N184 Chronic kidney disease, stage 4 (severe): Secondary | ICD-10-CM | POA: Diagnosis not present

## 2019-05-03 DIAGNOSIS — Z992 Dependence on renal dialysis: Secondary | ICD-10-CM | POA: Diagnosis not present

## 2019-05-03 DIAGNOSIS — D631 Anemia in chronic kidney disease: Secondary | ICD-10-CM | POA: Diagnosis not present

## 2019-05-03 DIAGNOSIS — N2581 Secondary hyperparathyroidism of renal origin: Secondary | ICD-10-CM | POA: Diagnosis not present

## 2019-05-03 DIAGNOSIS — D509 Iron deficiency anemia, unspecified: Secondary | ICD-10-CM | POA: Diagnosis not present

## 2019-05-05 DIAGNOSIS — N184 Chronic kidney disease, stage 4 (severe): Secondary | ICD-10-CM | POA: Diagnosis not present

## 2019-05-05 DIAGNOSIS — N2889 Other specified disorders of kidney and ureter: Secondary | ICD-10-CM | POA: Diagnosis not present

## 2019-05-05 DIAGNOSIS — N186 End stage renal disease: Secondary | ICD-10-CM | POA: Diagnosis not present

## 2019-05-05 DIAGNOSIS — N2581 Secondary hyperparathyroidism of renal origin: Secondary | ICD-10-CM | POA: Diagnosis not present

## 2019-05-05 DIAGNOSIS — D509 Iron deficiency anemia, unspecified: Secondary | ICD-10-CM | POA: Diagnosis not present

## 2019-05-05 DIAGNOSIS — Z992 Dependence on renal dialysis: Secondary | ICD-10-CM | POA: Diagnosis not present

## 2019-05-05 DIAGNOSIS — D631 Anemia in chronic kidney disease: Secondary | ICD-10-CM | POA: Diagnosis not present

## 2019-05-08 DIAGNOSIS — D631 Anemia in chronic kidney disease: Secondary | ICD-10-CM | POA: Diagnosis not present

## 2019-05-08 DIAGNOSIS — D509 Iron deficiency anemia, unspecified: Secondary | ICD-10-CM | POA: Diagnosis not present

## 2019-05-08 DIAGNOSIS — N186 End stage renal disease: Secondary | ICD-10-CM | POA: Diagnosis not present

## 2019-05-08 DIAGNOSIS — N184 Chronic kidney disease, stage 4 (severe): Secondary | ICD-10-CM | POA: Diagnosis not present

## 2019-05-08 DIAGNOSIS — N2581 Secondary hyperparathyroidism of renal origin: Secondary | ICD-10-CM | POA: Diagnosis not present

## 2019-05-08 DIAGNOSIS — Z992 Dependence on renal dialysis: Secondary | ICD-10-CM | POA: Diagnosis not present

## 2019-05-09 NOTE — Progress Notes (Deleted)
CARDIOLOGY OFFICE NOTE  Date:  05/09/2019    Eric [name] Date of Birth: [birth year] Medical Record #[id number]  PCP:  Gayland Curry, DO  Cardiologist:  Servando Snare & ***    No chief complaint on file.   History of Present Illness: Eric [name] is a 86 y.o. male who presents today for a *** history of ESRF on HD, HTN >> now with relative hypotension, HLD, GERD, Gout, VHD s/p AVR (bioprosthetic, 2016), CAD (CABG 2016), ICM, chronic CHF (systolic), LBBB  He is listed as DNR  He was hospitalized Oct 2020, with syncope following a dialysis session. Associated with N/V. He was hypotensive and orthostatic. In the ER CXR noted RLL infiltrate. He was seen by speech - recommended to have diet of regular consistency with thin liquids. Hewasplaced on Midodrine and received a course of antibiotics.  He saw L. Gerardt, NP following this hospitalization, he reported always feeling tired (no different) was doing better with HD, BP mildly improved, and suspect we had reached his tolerated maximal medical therapy with his BP now requiring midodrine   He comes today with c/o acutely worsening fatigue and generalized weakness.  He says that he has not felt great since his last hospital stay, though it seem that in the last 2 weeks he is more fatigued, waking SOB in the middle of the night, nighttime cough.  He denies feeling sick, no symptoms of illness otherwise, no fever that he is aware of. He states he has been doing better with HD, they have been able to complete his sessions and his BP has held with the midodrine. He says they told him initially at the dialysis center that he probably had more fluid on board, but isn't sure if they adjusted his sessions in any way He does not have CP, no palpitations No near syncope or syncope, but will feel weak and have to sit to rest, he does not interpret this as near fainting. He mentions that he used to feel good after HD, able to do errands and  feel OK, but can no longer do this, mentions that he is tired all of the time and never feels like he has energy any more.  Naps at HD.  He does think that the nights that he is waking up SOB are the evening prior to his HD, this is particularly a new symptom  The patient {does/does not:200015} have symptoms concerning for COVID-19 infection (fever, chills, cough, or new shortness of breath).   Comes in today. Here with   Past Medical History:  Diagnosis Date  . Anxiety   . Arthritis   . CHF (congestive heart failure) (HCC)    chronic systolic CHF  . Complication of anesthesia    " one time my heart stopped due to a medication that I was on."   . Coronary artery disease   . ESRD (end stage renal disease) on dialysis ([place])    Tues, Thursday, Saturday; [facility]; [place] (10/10/2017)  . GERD (gastroesophageal reflux disease)   . Gout   . Heart murmur    as a teen  . Herpes zoster 04/2012  . Hypertension   . Left bundle branch block   . Pneumonia 10/2018  . Shortness of breath dyspnea    with exertion- very little activity    Past Surgical History:  Procedure Laterality Date  . AORTIC VALVE REPLACEMENT N/A 12/09/2014   Procedure: AORTIC VALVE REPLACEMENT (AVR) WITH 23MM MAGNA EASE BIOPROSTHETIC  VALVE;  Surgeon: Gaye Pollack, MD;  Location: [place];  Service: Open Heart Surgery;  Laterality: N/A;  . APPENDECTOMY  1944  . AV FISTULA PLACEMENT Left    Left Cimino AVF placed in [place]   . AV FISTULA PLACEMENT Right 03/06/2014   Procedure: ARTERIOVENOUS (AV) FISTULA CREATION-right radiocephalic;  Surgeon: Mal Misty, MD;  Location: [facility] OR;  Service: Vascular;  Laterality: Right;  . AV FISTULA PLACEMENT Right 07/15/2014   Procedure: ARTERIOVENOUS (AV) FISTULA CREATION;  Surgeon: Elam Dutch, MD;  Location: [place];  Service: Vascular;  Laterality: Right;  . CARDIAC CATHETERIZATION N/A 11/27/2014   Procedure: Right/Left Heart Cath and Coronary Angiography;   Surgeon: Burnell Blanks, MD;  Location: [place] [facility];  Service: Cardiovascular;  Laterality: N/A;  . CATARACT EXTRACTION W/ INTRAOCULAR LENS IMPLANT Bilateral 2014   [facility]  . COLONOSCOPY  2013   Dr. Annamaria Helling [place], [place].  . CORONARY ARTERY BYPASS GRAFT N/A 12/09/2014   Procedure: CORONARY ARTERY BYPASS GRAFTING (CABG), ON PUMP, TIMES THREE, USING LEFT INTERNAL MAMMARY ARTERY, RIGHT GREATER SAPHENOUS VEIN HARVESTED ENDOSCOPICALLY;  Surgeon: Gaye Pollack, MD;  Location: [place];  Service: Open Heart Surgery;  Laterality: N/A;  LIMA-LAD; SVG-OM; SVG-PD  . DIALYSIS FISTULA CREATION  08/04/2012 and 10/13   Dr. Harden Mo  . FISTULOGRAM Right 04/12/2019   Procedure: FISTULOGRAM with Balloon Venoplasty of Right Peripheral Vein;  Surgeon: Serafina Mitchell, MD;  Location: [place];  Service: Vascular;  Laterality: Right;  . INSERTION OF DIALYSIS CATHETER Right 01-15-14   Right chest TDC placed by Dr. Augustin Coupe at [facility]  . LIGATION OF ARTERIOVENOUS  FISTULA Right 07/15/2014   Procedure: LIGATION OF ARTERIOVENOUS  FISTULA  (RIGHT RADIOCEPHALIC);  Surgeon: Elam Dutch, MD;  Location: [place];  Service: Vascular;  Laterality: Right;  . LIGATION OF COMPETING BRANCHES OF ARTERIOVENOUS FISTULA Right 05/08/2014   Procedure: LIGATION OF COMPETING BRANCHES OF RIGHT ARM RADIOCEPHALIC ARTERIOVENOUS FISTULA;  Surgeon: Mal Misty, MD;  Location: [place];  Service: Vascular;  Laterality: Right;  . REVISON OF ARTERIOVENOUS FISTULA Right 07/15/2014   Procedure: EXPLORATION OF ARTERIOVENOUS FISTULA;  Surgeon: Elam Dutch, MD;  Location: [place];  Service: Vascular;  Laterality: Right;  . REVISON OF ARTERIOVENOUS FISTULA Right 04/12/2019   Procedure: REVISON OF ARTERIOVENOUS FISTULA;  Surgeon: Serafina Mitchell, MD;  Location: [place];  Service: Vascular;  Laterality: Right;  . TEE WITHOUT CARDIOVERSION N/A 12/09/2014   Procedure: TRANSESOPHAGEAL ECHOCARDIOGRAM (TEE);  Surgeon: Gaye Pollack, MD;   Location: [place];  Service: Open Heart Surgery;  Laterality: N/A;     Medications: No outpatient medications have been marked as taking for the 05/16/19 encounter (Appointment) with Burtis Junes, NP.     Allergies: No Known Allergies  Social History: The patient  reports that he has never smoked. He has never used smokeless tobacco. He reports current alcohol use of about 7.0 standard drinks of alcohol per week. He reports that he does not use drugs.   Family History: The patient's ***family history includes Cancer in his mother; Diabetes in his father; Lung cancer (age of onset: 67) in his father.   Review of Systems: Please see the history of present illness.   All other systems are reviewed and negative.   Physical Exam: VS:  There were no vitals taken for this visit. Marland Kitchen  BMI There is no height or weight on file to calculate BMI.  Wt  Readings from Last 3 Encounters:  04/12/19 163 lb (73.9 kg)  04/09/19 163 lb (73.9 kg)  03/16/19 163 lb (73.9 kg)    General: Pleasant. Well developed, well nourished and in no acute distress.   HEENT: Normal.  Neck: Supple, no JVD, carotid bruits, or masses noted.  Cardiac: ***Regular rate and rhythm. No murmurs, rubs, or gallops. No edema.  Respiratory:  Lungs are clear to auscultation bilaterally with normal work of breathing.  GI: Soft and nontender.  MS: No deformity or atrophy. Gait and ROM intact.  Skin: Warm and dry. Color is normal.  Neuro:  Strength and sensation are intact and no gross focal deficits noted.  Psych: Alert, appropriate and with normal affect.   LABORATORY DATA:  EKG:  EKG {ACTION; IS/IS VOJ:[id number]} ordered today.  Personally reviewed by me. This demonstrates ***.  Lab Results  Component Value Date   WBC 8.3 11/04/2018   HGB 10.9 (L) 04/12/2019   HCT 32.0 (L) 04/12/2019   PLT 161 11/04/2018   GLUCOSE 111 (H) 04/12/2019   CHOL 125 02/07/2017   TRIG 80 02/07/2017   HDL 37 (L) 02/07/2017   LDLCALC 72  02/07/2017   ALT 16 11/02/2018   AST 40 11/02/2018   NA 136 04/12/2019   K 5.0 04/12/2019   CL 100 04/12/2019   CREATININE 9.20 (H) 04/12/2019   BUN 56 (H) 04/12/2019   CO2 25 11/04/2018   INR 1.42 12/09/2014   HGBA1C 5.2 12/06/2014     BNP (last 3 results) No results for input(s): BNP in the last 8760 hours.  ProBNP (last 3 results) No results for input(s): PROBNP in the last 8760 hours.   Other Studies Reviewed Today: ECHOIMPRESSIONS5/15/2020  1. The left ventricle has severely reduced systolic function, with an ejection fraction of 20-25%. The cavity size was moderately dilated. Left ventricular diastolic Doppler parameters are indeterminate. There is abnormal septal motion consistent with  left bundle branch block. Left ventricular diffuse hypokinesis. 2. The right ventricle has severely reduced systolic function. The cavity was normal. There is no increase in right ventricular wall thickness. 3. A 31mm an Edwards Magna-Ease valve is present in the aortic position. Procedure Date: 12/09/14. The leaflets are not well visualized. No evidence of prosthetic or paravalvular regurgitation. Mean systolic gradient 8 mmHg, in the setting of severely  reduced LV systolic function. 4. Left atrial size was moderately dilated. 5. Mitral valve regurgitation is moderate by color flow Doppler. 6. When compared to the prior study: Compared to exam from 06/25/16, the ejection fraction has decreased. Mitral regurgitation may have increased. Side by side comparison of images performed.  06/25/2016: LVEF 35-40% 01/10/2015: LVEF 40-45% 11/25/2014: LVEF 35-40%   11/27/2014: LHC  Mid LAD lesion, 99% stenosed.  Dist LAD lesion, 90% stenosed.  Ost 2nd Diag lesion, 90% stenosed.  Prox LAD to Mid LAD lesion, 80% stenosed.  2nd Mrg lesion, 60% stenosed.  Ost 3rd Mrg lesion, 100% stenosed.  Prox RCA lesion, 95% stenosed.  Mid RCA lesion, 99% stenosed.  Dist RCA lesion, 100%  stenosed.  1. Severe triple vessel CAD 2. Severe stenosis in the heavily calcified mid LAD  3. Chronic occlusion OM branch 4. Chronic occlusion distal RCA 5. Ischemic cardiomyopathy with normal filling pressures.  6. Likely moderately severe AS by echo (based on appearance of the valve with restricted leaflet motion, mean gradient over 27mmHg).  Recommendations: His CAD is diffuse and not favorable for PCI. He is a very active 86 yo man and despite  ESRD on HD, he is functional. I will refer to CT surgery to discuss CABG with combined AVR  Assessment/Plan: 1. CM (presumably ischemic, goes back to time of CABG) 2. Chronic CHF     Volume management with dialysis     I do not appreciate any exam findings of overt volume OL this AM     He describes orthopnea on nights in-between HD  3. CAD     CABG 2016     On statin, no BB with relative and orthostatic hypotension now     No reports of CP     Prior notes have favored conservative management strategy going forward  4. VHD     Bioprosthetic AVR 2016     Echo May 2020 above   5. HTN     Now with relative hypotension and orthostatic changes requiring high dose midodrine   This is my first time meeting Mr Pursel, I reviewed his chart at length, he had a Geriatrics visit in Jan with what sound much the same complaints.  Though he reported his exertional fatigue and decreased functional capacity as new  given L. Servando Snare, NP knows him well, I discussed his complaints and concerns with her to get a feel for the chronicity or not of his complaints. Much of what he mentions is much the same as she has heard in the past Orthopnea though not a known or recurrent mention.  Margarita Grizzle and myself discussed the difficult situation, and likely multifactorial causes for his general functional decline, weakness and hard balance between taking off enough fluid to keep him form feeling SOB at night and not too much to worsen his already low BP's and  orthostatic symptoms  I do not appreciate any acute issues that would require hospitalization Would recommend if able with HD to try and take more volume off, I will send my note to Dr. Lorrene Reid as well.  discussed importance of pacing himself, symptom recognition of low BP and safety. He has not fainted since his last hospital stay in Oct last year and tells me he no longer drives  . COVID-19 Education: The signs and symptoms of COVID-19 were discussed with the patient and how to seek care for testing (follow up with PCP or arrange E-visit).  The importance of social distancing, staying at home, hand hygiene and wearing a mask when out in public were discussed today.  Current medicines are reviewed with the patient today.  The patient does not have concerns regarding medicines other than what has been noted above.  The following changes have been made:  See above.  Labs/ tests ordered today include:   No orders of the defined types were placed in this encounter.    Disposition:   FU with *** in {gen number [id number]:[id number]} {Days to years:10300}.   Patient is agreeable to this plan and will call if any problems develop in the interim.   SignedTruitt Merle, NP  05/09/2019 12:34 PM  [facility] [address] [place], [place]  [address] Phone: [phone] Fax: [phone]

## 2019-05-10 DIAGNOSIS — N186 End stage renal disease: Secondary | ICD-10-CM | POA: Diagnosis not present

## 2019-05-10 DIAGNOSIS — Z992 Dependence on renal dialysis: Secondary | ICD-10-CM | POA: Diagnosis not present

## 2019-05-10 DIAGNOSIS — D509 Iron deficiency anemia, unspecified: Secondary | ICD-10-CM | POA: Diagnosis not present

## 2019-05-10 DIAGNOSIS — D631 Anemia in chronic kidney disease: Secondary | ICD-10-CM | POA: Diagnosis not present

## 2019-05-10 DIAGNOSIS — N2581 Secondary hyperparathyroidism of renal origin: Secondary | ICD-10-CM | POA: Diagnosis not present

## 2019-05-10 DIAGNOSIS — N184 Chronic kidney disease, stage 4 (severe): Secondary | ICD-10-CM | POA: Diagnosis not present

## 2019-05-12 DIAGNOSIS — N186 End stage renal disease: Secondary | ICD-10-CM | POA: Diagnosis not present

## 2019-05-12 DIAGNOSIS — N184 Chronic kidney disease, stage 4 (severe): Secondary | ICD-10-CM | POA: Diagnosis not present

## 2019-05-12 DIAGNOSIS — Z992 Dependence on renal dialysis: Secondary | ICD-10-CM | POA: Diagnosis not present

## 2019-05-12 DIAGNOSIS — D509 Iron deficiency anemia, unspecified: Secondary | ICD-10-CM | POA: Diagnosis not present

## 2019-05-12 DIAGNOSIS — D631 Anemia in chronic kidney disease: Secondary | ICD-10-CM | POA: Diagnosis not present

## 2019-05-12 DIAGNOSIS — N2581 Secondary hyperparathyroidism of renal origin: Secondary | ICD-10-CM | POA: Diagnosis not present

## 2019-05-15 DIAGNOSIS — N2581 Secondary hyperparathyroidism of renal origin: Secondary | ICD-10-CM | POA: Diagnosis not present

## 2019-05-15 DIAGNOSIS — D631 Anemia in chronic kidney disease: Secondary | ICD-10-CM | POA: Diagnosis not present

## 2019-05-15 DIAGNOSIS — D509 Iron deficiency anemia, unspecified: Secondary | ICD-10-CM | POA: Diagnosis not present

## 2019-05-15 DIAGNOSIS — Z992 Dependence on renal dialysis: Secondary | ICD-10-CM | POA: Diagnosis not present

## 2019-05-15 DIAGNOSIS — N184 Chronic kidney disease, stage 4 (severe): Secondary | ICD-10-CM | POA: Diagnosis not present

## 2019-05-15 DIAGNOSIS — N186 End stage renal disease: Secondary | ICD-10-CM | POA: Diagnosis not present

## 2019-05-16 ENCOUNTER — Ambulatory Visit: Payer: Medicare Other | Admitting: Nurse Practitioner

## 2019-05-17 DIAGNOSIS — D509 Iron deficiency anemia, unspecified: Secondary | ICD-10-CM | POA: Diagnosis not present

## 2019-05-17 DIAGNOSIS — N2581 Secondary hyperparathyroidism of renal origin: Secondary | ICD-10-CM | POA: Diagnosis not present

## 2019-05-17 DIAGNOSIS — D631 Anemia in chronic kidney disease: Secondary | ICD-10-CM | POA: Diagnosis not present

## 2019-05-17 DIAGNOSIS — N186 End stage renal disease: Secondary | ICD-10-CM | POA: Diagnosis not present

## 2019-05-17 DIAGNOSIS — N184 Chronic kidney disease, stage 4 (severe): Secondary | ICD-10-CM | POA: Diagnosis not present

## 2019-05-17 DIAGNOSIS — Z992 Dependence on renal dialysis: Secondary | ICD-10-CM | POA: Diagnosis not present

## 2019-05-19 DIAGNOSIS — Z992 Dependence on renal dialysis: Secondary | ICD-10-CM | POA: Diagnosis not present

## 2019-05-19 DIAGNOSIS — N2581 Secondary hyperparathyroidism of renal origin: Secondary | ICD-10-CM | POA: Diagnosis not present

## 2019-05-19 DIAGNOSIS — D509 Iron deficiency anemia, unspecified: Secondary | ICD-10-CM | POA: Diagnosis not present

## 2019-05-19 DIAGNOSIS — N186 End stage renal disease: Secondary | ICD-10-CM | POA: Diagnosis not present

## 2019-05-19 DIAGNOSIS — D631 Anemia in chronic kidney disease: Secondary | ICD-10-CM | POA: Diagnosis not present

## 2019-05-19 DIAGNOSIS — N184 Chronic kidney disease, stage 4 (severe): Secondary | ICD-10-CM | POA: Diagnosis not present

## 2019-05-22 DIAGNOSIS — D509 Iron deficiency anemia, unspecified: Secondary | ICD-10-CM | POA: Diagnosis not present

## 2019-05-22 DIAGNOSIS — N186 End stage renal disease: Secondary | ICD-10-CM | POA: Diagnosis not present

## 2019-05-22 DIAGNOSIS — Z992 Dependence on renal dialysis: Secondary | ICD-10-CM | POA: Diagnosis not present

## 2019-05-22 DIAGNOSIS — D631 Anemia in chronic kidney disease: Secondary | ICD-10-CM | POA: Diagnosis not present

## 2019-05-22 DIAGNOSIS — N2581 Secondary hyperparathyroidism of renal origin: Secondary | ICD-10-CM | POA: Diagnosis not present

## 2019-05-22 DIAGNOSIS — N184 Chronic kidney disease, stage 4 (severe): Secondary | ICD-10-CM | POA: Diagnosis not present

## 2019-05-24 DIAGNOSIS — N2581 Secondary hyperparathyroidism of renal origin: Secondary | ICD-10-CM | POA: Diagnosis not present

## 2019-05-24 DIAGNOSIS — N186 End stage renal disease: Secondary | ICD-10-CM | POA: Diagnosis not present

## 2019-05-24 DIAGNOSIS — Z992 Dependence on renal dialysis: Secondary | ICD-10-CM | POA: Diagnosis not present

## 2019-05-24 DIAGNOSIS — N184 Chronic kidney disease, stage 4 (severe): Secondary | ICD-10-CM | POA: Diagnosis not present

## 2019-05-24 DIAGNOSIS — D509 Iron deficiency anemia, unspecified: Secondary | ICD-10-CM | POA: Diagnosis not present

## 2019-05-24 DIAGNOSIS — D631 Anemia in chronic kidney disease: Secondary | ICD-10-CM | POA: Diagnosis not present

## 2019-05-26 DIAGNOSIS — N184 Chronic kidney disease, stage 4 (severe): Secondary | ICD-10-CM | POA: Diagnosis not present

## 2019-05-26 DIAGNOSIS — Z992 Dependence on renal dialysis: Secondary | ICD-10-CM | POA: Diagnosis not present

## 2019-05-26 DIAGNOSIS — D631 Anemia in chronic kidney disease: Secondary | ICD-10-CM | POA: Diagnosis not present

## 2019-05-26 DIAGNOSIS — N186 End stage renal disease: Secondary | ICD-10-CM | POA: Diagnosis not present

## 2019-05-26 DIAGNOSIS — D509 Iron deficiency anemia, unspecified: Secondary | ICD-10-CM | POA: Diagnosis not present

## 2019-05-26 DIAGNOSIS — N2581 Secondary hyperparathyroidism of renal origin: Secondary | ICD-10-CM | POA: Diagnosis not present

## 2019-05-29 DIAGNOSIS — D631 Anemia in chronic kidney disease: Secondary | ICD-10-CM | POA: Diagnosis not present

## 2019-05-29 DIAGNOSIS — Z992 Dependence on renal dialysis: Secondary | ICD-10-CM | POA: Diagnosis not present

## 2019-05-29 DIAGNOSIS — N184 Chronic kidney disease, stage 4 (severe): Secondary | ICD-10-CM | POA: Diagnosis not present

## 2019-05-29 DIAGNOSIS — N186 End stage renal disease: Secondary | ICD-10-CM | POA: Diagnosis not present

## 2019-05-29 DIAGNOSIS — N2581 Secondary hyperparathyroidism of renal origin: Secondary | ICD-10-CM | POA: Diagnosis not present

## 2019-05-29 DIAGNOSIS — D509 Iron deficiency anemia, unspecified: Secondary | ICD-10-CM | POA: Diagnosis not present

## 2019-05-30 ENCOUNTER — Encounter: Payer: Medicare Other | Admitting: Internal Medicine

## 2019-05-30 ENCOUNTER — Other Ambulatory Visit: Payer: Self-pay

## 2019-05-31 DIAGNOSIS — Z992 Dependence on renal dialysis: Secondary | ICD-10-CM | POA: Diagnosis not present

## 2019-05-31 DIAGNOSIS — D509 Iron deficiency anemia, unspecified: Secondary | ICD-10-CM | POA: Diagnosis not present

## 2019-05-31 DIAGNOSIS — N2581 Secondary hyperparathyroidism of renal origin: Secondary | ICD-10-CM | POA: Diagnosis not present

## 2019-05-31 DIAGNOSIS — D631 Anemia in chronic kidney disease: Secondary | ICD-10-CM | POA: Diagnosis not present

## 2019-05-31 DIAGNOSIS — N184 Chronic kidney disease, stage 4 (severe): Secondary | ICD-10-CM | POA: Diagnosis not present

## 2019-05-31 DIAGNOSIS — N186 End stage renal disease: Secondary | ICD-10-CM | POA: Diagnosis not present

## 2019-06-02 DIAGNOSIS — N184 Chronic kidney disease, stage 4 (severe): Secondary | ICD-10-CM | POA: Diagnosis not present

## 2019-06-02 DIAGNOSIS — N2581 Secondary hyperparathyroidism of renal origin: Secondary | ICD-10-CM | POA: Diagnosis not present

## 2019-06-02 DIAGNOSIS — N186 End stage renal disease: Secondary | ICD-10-CM | POA: Diagnosis not present

## 2019-06-02 DIAGNOSIS — D509 Iron deficiency anemia, unspecified: Secondary | ICD-10-CM | POA: Diagnosis not present

## 2019-06-02 DIAGNOSIS — Z992 Dependence on renal dialysis: Secondary | ICD-10-CM | POA: Diagnosis not present

## 2019-06-02 DIAGNOSIS — D631 Anemia in chronic kidney disease: Secondary | ICD-10-CM | POA: Diagnosis not present

## 2019-06-07 DIAGNOSIS — I871 Compression of vein: Secondary | ICD-10-CM | POA: Diagnosis not present

## 2019-06-07 DIAGNOSIS — N186 End stage renal disease: Secondary | ICD-10-CM | POA: Diagnosis not present

## 2019-06-07 DIAGNOSIS — Z992 Dependence on renal dialysis: Secondary | ICD-10-CM | POA: Diagnosis not present

## 2019-06-13 ENCOUNTER — Other Ambulatory Visit: Payer: Self-pay

## 2019-06-13 ENCOUNTER — Non-Acute Institutional Stay: Payer: Medicare Other | Admitting: Internal Medicine

## 2019-06-13 ENCOUNTER — Encounter: Payer: Self-pay | Admitting: Internal Medicine

## 2019-06-13 VITALS — BP 122/58 | HR 72 | Temp 97.8°F | Ht 69.0 in | Wt 161.0 lb

## 2019-06-13 DIAGNOSIS — I953 Hypotension of hemodialysis: Secondary | ICD-10-CM

## 2019-06-13 DIAGNOSIS — I5020 Unspecified systolic (congestive) heart failure: Secondary | ICD-10-CM | POA: Diagnosis not present

## 2019-06-13 DIAGNOSIS — J9611 Chronic respiratory failure with hypoxia: Secondary | ICD-10-CM

## 2019-06-13 DIAGNOSIS — I255 Ischemic cardiomyopathy: Secondary | ICD-10-CM | POA: Diagnosis not present

## 2019-06-13 DIAGNOSIS — M545 Low back pain, unspecified: Secondary | ICD-10-CM

## 2019-06-13 DIAGNOSIS — G8929 Other chronic pain: Secondary | ICD-10-CM

## 2019-06-13 DIAGNOSIS — Z992 Dependence on renal dialysis: Secondary | ICD-10-CM

## 2019-06-13 DIAGNOSIS — N186 End stage renal disease: Secondary | ICD-10-CM

## 2019-06-13 DIAGNOSIS — M792 Neuralgia and neuritis, unspecified: Secondary | ICD-10-CM

## 2019-06-13 DIAGNOSIS — I770 Arteriovenous fistula, acquired: Secondary | ICD-10-CM

## 2019-06-13 DIAGNOSIS — Z66 Do not resuscitate: Secondary | ICD-10-CM

## 2019-06-13 DIAGNOSIS — N2581 Secondary hyperparathyroidism of renal origin: Secondary | ICD-10-CM

## 2019-06-13 NOTE — Progress Notes (Signed)
Location:  Occupational psychologist of Service:  Clinic (12)  Provider: Lydia Meng L. Mariea Clonts, D.O., C.M.D.  Code Status: DNR Goals of Care:  Advanced Directives 06/13/2019  Does Patient Have a Medical Advance Directive? Yes  Type of Advance Directive Out of facility DNR (pink MOST or yellow form)  Does patient want to make changes to medical advance directive? No - Patient declined  Copy of [place] in Chart? -  Would patient like information on creating a medical advance directive? -  Pre-existing out of facility DNR order (yellow form or pink MOST form) -     Chief Complaint  Patient presents with  . Medical Management of Chronic Issues    Follow up     HPI: Patient is a 86 y.o. male seen today for medical management of chronic diseases.    Not [place] [facility] when he has the extra day b/w HD--needing to use wheelchair.      Left foot hurts.  It's in the outer edge.  He's had inserts in his shoes a long time (well over twenty years).  Sometimes it hurts at night when he's trying to sleep.  Not too bothersome if does not walk on it.  Not burning or tingling.  Just hurts.    Tells me that he may need a new dialysis access as his is no longer working as well.  He already had AV fistula revision in April.    Past Medical History:  Diagnosis Date  . Anxiety   . Arthritis   . CHF (congestive heart failure) (HCC)    chronic systolic CHF  . Complication of anesthesia    " one time my heart stopped due to a medication that I was on."   . Coronary artery disease   . ESRD (end stage renal disease) on dialysis ([place])    Tues, Thursday, Saturday; [facility]; [facility] (10/10/2017)  . GERD (gastroesophageal reflux disease)   . Gout   . Heart murmur    as a teen  . Herpes zoster 04/2012  . Hypertension   . Left bundle branch block   . Pneumonia 10/2018  . Shortness of breath dyspnea    with exertion- very little activity    Past  Surgical History:  Procedure Laterality Date  . AORTIC VALVE REPLACEMENT N/A 12/09/2014   Procedure: AORTIC VALVE REPLACEMENT (AVR) WITH 23MM MAGNA EASE BIOPROSTHETIC VALVE;  Surgeon: Gaye Pollack, MD;  Location: [facility];  Service: Open Heart Surgery;  Laterality: N/A;  . APPENDECTOMY  1944  . AV FISTULA PLACEMENT Left    Left [place] AVF placed in [place]   . AV FISTULA PLACEMENT Right 03/06/2014   Procedure: ARTERIOVENOUS (AV) FISTULA CREATION-right radiocephalic;  Surgeon: Mal Misty, MD;  Location: [facility] OR;  Service: Vascular;  Laterality: Right;  . AV FISTULA PLACEMENT Right 07/15/2014   Procedure: ARTERIOVENOUS (AV) FISTULA CREATION;  Surgeon: Elam Dutch, MD;  Location: [facility];  Service: Vascular;  Laterality: Right;  . CARDIAC CATHETERIZATION N/A 11/27/2014   Procedure: Right/Left Heart Cath and Coronary Angiography;  Surgeon: Burnell Blanks, MD;  Location: [facility];  Service: Cardiovascular;  Laterality: N/A;  . CATARACT EXTRACTION W/ INTRAOCULAR LENS IMPLANT Bilateral 2014   [facility]  . COLONOSCOPY  2013   Dr. Annamaria Helling [place], [place].  . CORONARY ARTERY BYPASS GRAFT N/A 12/09/2014   Procedure: CORONARY ARTERY BYPASS GRAFTING (CABG), ON PUMP,  TIMES THREE, USING LEFT INTERNAL MAMMARY ARTERY, RIGHT GREATER SAPHENOUS VEIN HARVESTED ENDOSCOPICALLY;  Surgeon: Gaye Pollack, MD;  Location: [facility];  Service: Open Heart Surgery;  Laterality: N/A;  LIMA-LAD; SVG-OM; SVG-PD  . DIALYSIS FISTULA CREATION  08/04/2012 and 10/13   Dr. Harden Mo  . FISTULOGRAM Right 04/12/2019   Procedure: FISTULOGRAM with Balloon Venoplasty of Right Peripheral Vein;  Surgeon: Serafina Mitchell, MD;  Location: [facility];  Service: Vascular;  Laterality: Right;  . INSERTION OF DIALYSIS CATHETER Right 01-15-14   Right chest TDC placed by Dr. Augustin Coupe at [facility]  . LIGATION OF ARTERIOVENOUS  FISTULA Right 07/15/2014   Procedure: LIGATION OF ARTERIOVENOUS  FISTULA  (RIGHT RADIOCEPHALIC);   Surgeon: Elam Dutch, MD;  Location: [facility];  Service: Vascular;  Laterality: Right;  . LIGATION OF COMPETING BRANCHES OF ARTERIOVENOUS FISTULA Right 05/08/2014   Procedure: LIGATION OF COMPETING BRANCHES OF RIGHT ARM RADIOCEPHALIC ARTERIOVENOUS FISTULA;  Surgeon: Mal Misty, MD;  Location: [facility];  Service: Vascular;  Laterality: Right;  . REVISON OF ARTERIOVENOUS FISTULA Right 07/15/2014   Procedure: EXPLORATION OF ARTERIOVENOUS FISTULA;  Surgeon: Elam Dutch, MD;  Location: [facility];  Service: Vascular;  Laterality: Right;  . REVISON OF ARTERIOVENOUS FISTULA Right 04/12/2019   Procedure: REVISON OF ARTERIOVENOUS FISTULA;  Surgeon: Serafina Mitchell, MD;  Location: [facility];  Service: Vascular;  Laterality: Right;  . TEE WITHOUT CARDIOVERSION N/A 12/09/2014   Procedure: TRANSESOPHAGEAL ECHOCARDIOGRAM (TEE);  Surgeon: Gaye Pollack, MD;  Location: [facility];  Service: Open Heart Surgery;  Laterality: N/A;    No Known Allergies  Outpatient Encounter Medications as of 06/13/2019  Medication Sig  . acetaminophen (TYLENOL) 500 MG tablet Take 1,000 mg by mouth every 6 (six) hours as needed for moderate pain.  Marland Kitchen allopurinol (ZYLOPRIM) 100 MG tablet TAKE 1 TABLET BY MOUTH DAILY (Patient taking differently: Take 100 mg by mouth daily. )  . atorvastatin (LIPITOR) 20 MG tablet TAKE 1 TABLET BY MOUTH DAILY FOR CHOLESTEROL (Patient taking differently: Take 20 mg by mouth daily. )  . calcium carbonate (TUMS - DOSED IN MG ELEMENTAL CALCIUM) 500 MG chewable tablet Chew 1,500 mg by mouth 3 (three) times daily with meals.   . cinacalcet (SENSIPAR) 60 MG tablet Take 60 mg by mouth daily.   . midodrine (PROAMATINE) 10 MG tablet Take 10 mg by mouth 3 (three) times daily.  Marland Kitchen omeprazole (PRILOSEC) 20 MG capsule Take 20 mg by mouth daily.   Marland Kitchen oxyCODONE (ROXICODONE) 5 MG immediate release tablet Take 1 tablet (5 mg total) by mouth every 8 (eight) hours as needed.   No facility-administered encounter medications on file  as of 06/13/2019.    Review of Systems:  Review of Systems  Constitutional: Positive for malaise/fatigue. Negative for chills and fever.  HENT: Positive for hearing loss.   Eyes: Negative for blurred vision.  Respiratory: Positive for shortness of breath. Negative for cough.   Cardiovascular: Positive for leg swelling. Negative for chest pain and palpitations.  Gastrointestinal: Negative for abdominal pain, blood in stool, constipation, diarrhea and melena.  Genitourinary: Negative for dysuria.  Musculoskeletal: Negative for falls.  Skin: Negative for itching and rash.  Neurological: Negative for dizziness and loss of consciousness.  Endo/Heme/Allergies: Bruises/bleeds easily.  Psychiatric/Behavioral: Positive for memory loss. Negative for depression. The patient is not nervous/anxious and does not have insomnia.        Having more short-term memory challenges (he and his wife missed their appts b/c  of an error with his calendar in his phone)    Health Maintenance  Topic Date Due  . INFLUENZA VACCINE  08/05/2019  . TETANUS/TDAP  06/02/2027  . COVID-19 Vaccine  Completed  . PNA vac Low Risk Adult  Completed    Physical Exam: Vitals:   06/13/19 1439  BP: (!) 122/58  Pulse: 72  Temp: 97.8 F (36.6 C)  TempSrc: Temporal  SpO2: 96%  Weight: 161 lb (73 kg)  Height: 5\' 9"  (1.753 m)   Body mass index is 23.78 kg/m. Physical Exam Vitals reviewed.  Constitutional:      General: He is not in acute distress.    Appearance: He is ill-appearing. He is not toxic-appearing.     Comments: Has lost weight down to 161 lbs now  Eyes:     Comments: Uses readers  Cardiovascular:     Rate and Rhythm: Normal rate and regular rhythm.     Heart sounds: Murmur heard.   Pulmonary:     Effort: Pulmonary effort is normal.     Breath sounds: Normal breath sounds. No rales.  Abdominal:     General: Bowel sounds are normal.     Palpations: Abdomen is soft.     Tenderness: There is no  abdominal tenderness.  Musculoskeletal:        General: Normal range of motion.     Right lower leg: No edema.     Left lower leg: No edema.  Skin:    General: Skin is warm and dry.     Comments: RUE AV fistula  Neurological:     General: No focal deficit present.     Mental Status: He is alert and oriented to person, place, and time.     Comments: But does once in a while repeat something he's already told me or asked and he and his wife's appts are becoming more challenging  Psychiatric:        Mood and Affect: Mood normal.        Behavior: Behavior normal.     Labs reviewed: Basic Metabolic Panel: Recent Labs    11/02/18 0536 11/02/18 0536 11/02/18 0639 11/03/18 0427 11/04/18 0609 04/12/19 0646  NA 137   < >  --  138 139 136  K 5.2*   < >  --  4.3 4.2 5.0  CL 104   < >  --  100 99 100  CO2 21*  --   --  25 25  --   GLUCOSE 106*   < >  --  91 109* 111*  BUN 57*   < >  --  39* 45* 56*  CREATININE 8.28*   < >  --  5.87* 5.42* 9.20*  CALCIUM 9.0  --   --  8.8* 9.4  --   PHOS  --   --  4.0  --   --   --    < > = values in this interval not displayed.   Liver Function Tests: Recent Labs    10/31/18 2126 11/02/18 0536  AST 115* 40  ALT 21 16  ALKPHOS 63 46  BILITOT 1.2 0.6  PROT 7.7 6.0*  ALBUMIN 3.6 2.7*   Recent Labs    10/31/18 2126  LIPASE 34   No results for input(s): AMMONIA in the last 8760 hours. CBC: Recent Labs    10/31/18 2126 10/31/18 2126 11/02/18 0536 11/02/18 0536 11/03/18 0427 11/04/18 0609 04/12/19 0646  WBC 19.8*   < >  11.4*  --  8.5 8.3  --   NEUTROABS 17.8*  --   --   --   --   --   --   HGB 12.9*   < > 9.6*   < > 9.4* 10.0* 10.9*  HCT 38.5*   < > 29.0*   < > 28.3* 30.9* 32.0*  MCV 101.6*   < > 103.9*  --  102.2* 104.0*  --   PLT 169   < > 140*  --  134* 161  --    < > = values in this interval not displayed.   Lipid Panel: No results for input(s): CHOL, HDL, LDLCALC, TRIG, CHOLHDL, LDLDIRECT in the last 8760 hours. Lab  Results  Component Value Date   HGBA1C 5.2 12/06/2014    Procedures since last visit: 5/20 echo reviewed. The left ventricle has severely reduced systolic function, with an  ejection fraction of 20-25%. The cavity size was moderately dilated. Left  ventricular diastolic Doppler parameters are indeterminate. There is  abnormal septal motion consistent with  left bundle branch block. Left ventricular diffuse hypokinesis.   Assessment/Plan 1. Neuropathic pain of left foot -seems like neuropathic pain to me -suggested capsaicin topically and given written instructions  2. Systolic CHF with reduced left ventricular function, NYHA class 3 (HCC) -limiting his ability to walk around campus like he once could and ability to get down to [facility] transportation to HD three days per week -appears euvolemic today -has prn O2 and wheelchair transport when needed  3. ESRD on dialysis [facility]) -continues on tues/thurs/sat early at [facility] -tolerability declining and he struggles more b/w sessions -may need new access  -systolic chf also limiting   4. Chronic midline low back pain without sciatica -ongoing, appears there may be some stenosis as well b/c he walks most comfortably leaning forward with his hands folded behind him  5. Hemodialysis-associated hypotension -has been problematic but volume being taken off has been adjusted and he reports improvement  6. Secondary hyperparathyroidism, renal ([place]) -monitored by nephrology at HD  7. A-V fistula ([place]) -was revised, may need new location -he's having more difficulty managing b/w sessions  8. Chronic respiratory failure with hypoxia (HCC) -due to mix of chf and ESRD -has had oxygen to use b/w HD sessions and can get transportation down to [facility] when too dyspneic  9. DNR (do not resuscitate) - Do not attempt resuscitation (DNR)  Labs/tests ordered:  No new Next appt:  10/17/2019  Kiasha Bellin L. Kebron Pulse, D.O. [facility] [address], [place] [address] Cell Phone (Mon-Fri 8am-5pm):  [phone] On Call:  [phone] & follow prompts after 5pm & weekends Office Phone:  [phone] Office Fax:  [phone]

## 2019-06-13 NOTE — Patient Instructions (Signed)
It looks like your pain in your left lateral foot is due to a nerve. I recommend using capsaicin cream to the affected area up to 4 times per day as needed.  Be sure to use gloves to apply it and wash your hands afterward (made of hot peppers).

## 2019-07-05 DIAGNOSIS — N2581 Secondary hyperparathyroidism of renal origin: Secondary | ICD-10-CM | POA: Diagnosis not present

## 2019-07-05 DIAGNOSIS — Z992 Dependence on renal dialysis: Secondary | ICD-10-CM | POA: Diagnosis not present

## 2019-07-05 DIAGNOSIS — D631 Anemia in chronic kidney disease: Secondary | ICD-10-CM | POA: Diagnosis not present

## 2019-07-05 DIAGNOSIS — N2889 Other specified disorders of kidney and ureter: Secondary | ICD-10-CM | POA: Diagnosis not present

## 2019-07-05 DIAGNOSIS — N184 Chronic kidney disease, stage 4 (severe): Secondary | ICD-10-CM | POA: Diagnosis not present

## 2019-07-05 DIAGNOSIS — N186 End stage renal disease: Secondary | ICD-10-CM | POA: Diagnosis not present

## 2019-07-05 DIAGNOSIS — D509 Iron deficiency anemia, unspecified: Secondary | ICD-10-CM | POA: Diagnosis not present

## 2019-07-07 DIAGNOSIS — Z992 Dependence on renal dialysis: Secondary | ICD-10-CM | POA: Diagnosis not present

## 2019-07-07 DIAGNOSIS — D509 Iron deficiency anemia, unspecified: Secondary | ICD-10-CM | POA: Diagnosis not present

## 2019-07-07 DIAGNOSIS — N184 Chronic kidney disease, stage 4 (severe): Secondary | ICD-10-CM | POA: Diagnosis not present

## 2019-07-07 DIAGNOSIS — N186 End stage renal disease: Secondary | ICD-10-CM | POA: Diagnosis not present

## 2019-07-07 DIAGNOSIS — D631 Anemia in chronic kidney disease: Secondary | ICD-10-CM | POA: Diagnosis not present

## 2019-07-07 DIAGNOSIS — N2581 Secondary hyperparathyroidism of renal origin: Secondary | ICD-10-CM | POA: Diagnosis not present

## 2019-07-10 DIAGNOSIS — Z992 Dependence on renal dialysis: Secondary | ICD-10-CM | POA: Diagnosis not present

## 2019-07-10 DIAGNOSIS — N184 Chronic kidney disease, stage 4 (severe): Secondary | ICD-10-CM | POA: Diagnosis not present

## 2019-07-10 DIAGNOSIS — N2581 Secondary hyperparathyroidism of renal origin: Secondary | ICD-10-CM | POA: Diagnosis not present

## 2019-07-10 DIAGNOSIS — N186 End stage renal disease: Secondary | ICD-10-CM | POA: Diagnosis not present

## 2019-07-10 DIAGNOSIS — D631 Anemia in chronic kidney disease: Secondary | ICD-10-CM | POA: Diagnosis not present

## 2019-07-10 DIAGNOSIS — D509 Iron deficiency anemia, unspecified: Secondary | ICD-10-CM | POA: Diagnosis not present

## 2019-07-12 ENCOUNTER — Other Ambulatory Visit: Payer: Self-pay | Admitting: Internal Medicine

## 2019-07-12 DIAGNOSIS — D631 Anemia in chronic kidney disease: Secondary | ICD-10-CM | POA: Diagnosis not present

## 2019-07-12 DIAGNOSIS — Z992 Dependence on renal dialysis: Secondary | ICD-10-CM | POA: Diagnosis not present

## 2019-07-12 DIAGNOSIS — N184 Chronic kidney disease, stage 4 (severe): Secondary | ICD-10-CM | POA: Diagnosis not present

## 2019-07-12 DIAGNOSIS — D509 Iron deficiency anemia, unspecified: Secondary | ICD-10-CM | POA: Diagnosis not present

## 2019-07-12 DIAGNOSIS — N186 End stage renal disease: Secondary | ICD-10-CM | POA: Diagnosis not present

## 2019-07-12 DIAGNOSIS — N2581 Secondary hyperparathyroidism of renal origin: Secondary | ICD-10-CM | POA: Diagnosis not present

## 2019-07-14 DIAGNOSIS — N186 End stage renal disease: Secondary | ICD-10-CM | POA: Diagnosis not present

## 2019-07-14 DIAGNOSIS — N2581 Secondary hyperparathyroidism of renal origin: Secondary | ICD-10-CM | POA: Diagnosis not present

## 2019-07-14 DIAGNOSIS — N184 Chronic kidney disease, stage 4 (severe): Secondary | ICD-10-CM | POA: Diagnosis not present

## 2019-07-14 DIAGNOSIS — Z992 Dependence on renal dialysis: Secondary | ICD-10-CM | POA: Diagnosis not present

## 2019-07-14 DIAGNOSIS — D509 Iron deficiency anemia, unspecified: Secondary | ICD-10-CM | POA: Diagnosis not present

## 2019-07-14 DIAGNOSIS — D631 Anemia in chronic kidney disease: Secondary | ICD-10-CM | POA: Diagnosis not present

## 2019-07-17 DIAGNOSIS — D509 Iron deficiency anemia, unspecified: Secondary | ICD-10-CM | POA: Diagnosis not present

## 2019-07-17 DIAGNOSIS — Z992 Dependence on renal dialysis: Secondary | ICD-10-CM | POA: Diagnosis not present

## 2019-07-17 DIAGNOSIS — D631 Anemia in chronic kidney disease: Secondary | ICD-10-CM | POA: Diagnosis not present

## 2019-07-17 DIAGNOSIS — N186 End stage renal disease: Secondary | ICD-10-CM | POA: Diagnosis not present

## 2019-07-17 DIAGNOSIS — N2581 Secondary hyperparathyroidism of renal origin: Secondary | ICD-10-CM | POA: Diagnosis not present

## 2019-07-17 DIAGNOSIS — N184 Chronic kidney disease, stage 4 (severe): Secondary | ICD-10-CM | POA: Diagnosis not present

## 2019-07-19 DIAGNOSIS — D631 Anemia in chronic kidney disease: Secondary | ICD-10-CM | POA: Diagnosis not present

## 2019-07-19 DIAGNOSIS — N186 End stage renal disease: Secondary | ICD-10-CM | POA: Diagnosis not present

## 2019-07-19 DIAGNOSIS — D509 Iron deficiency anemia, unspecified: Secondary | ICD-10-CM | POA: Diagnosis not present

## 2019-07-19 DIAGNOSIS — N2581 Secondary hyperparathyroidism of renal origin: Secondary | ICD-10-CM | POA: Diagnosis not present

## 2019-07-19 DIAGNOSIS — N184 Chronic kidney disease, stage 4 (severe): Secondary | ICD-10-CM | POA: Diagnosis not present

## 2019-07-19 DIAGNOSIS — Z992 Dependence on renal dialysis: Secondary | ICD-10-CM | POA: Diagnosis not present

## 2019-07-21 DIAGNOSIS — N2581 Secondary hyperparathyroidism of renal origin: Secondary | ICD-10-CM | POA: Diagnosis not present

## 2019-07-21 DIAGNOSIS — D631 Anemia in chronic kidney disease: Secondary | ICD-10-CM | POA: Diagnosis not present

## 2019-07-21 DIAGNOSIS — N186 End stage renal disease: Secondary | ICD-10-CM | POA: Diagnosis not present

## 2019-07-21 DIAGNOSIS — N184 Chronic kidney disease, stage 4 (severe): Secondary | ICD-10-CM | POA: Diagnosis not present

## 2019-07-21 DIAGNOSIS — D509 Iron deficiency anemia, unspecified: Secondary | ICD-10-CM | POA: Diagnosis not present

## 2019-07-21 DIAGNOSIS — Z992 Dependence on renal dialysis: Secondary | ICD-10-CM | POA: Diagnosis not present

## 2019-07-24 DIAGNOSIS — Z992 Dependence on renal dialysis: Secondary | ICD-10-CM | POA: Diagnosis not present

## 2019-07-24 DIAGNOSIS — D631 Anemia in chronic kidney disease: Secondary | ICD-10-CM | POA: Diagnosis not present

## 2019-07-24 DIAGNOSIS — N184 Chronic kidney disease, stage 4 (severe): Secondary | ICD-10-CM | POA: Diagnosis not present

## 2019-07-24 DIAGNOSIS — N186 End stage renal disease: Secondary | ICD-10-CM | POA: Diagnosis not present

## 2019-07-24 DIAGNOSIS — D509 Iron deficiency anemia, unspecified: Secondary | ICD-10-CM | POA: Diagnosis not present

## 2019-07-24 DIAGNOSIS — N2581 Secondary hyperparathyroidism of renal origin: Secondary | ICD-10-CM | POA: Diagnosis not present

## 2019-07-26 DIAGNOSIS — N2581 Secondary hyperparathyroidism of renal origin: Secondary | ICD-10-CM | POA: Diagnosis not present

## 2019-07-26 DIAGNOSIS — N184 Chronic kidney disease, stage 4 (severe): Secondary | ICD-10-CM | POA: Diagnosis not present

## 2019-07-26 DIAGNOSIS — N186 End stage renal disease: Secondary | ICD-10-CM | POA: Diagnosis not present

## 2019-07-26 DIAGNOSIS — D631 Anemia in chronic kidney disease: Secondary | ICD-10-CM | POA: Diagnosis not present

## 2019-07-26 DIAGNOSIS — Z992 Dependence on renal dialysis: Secondary | ICD-10-CM | POA: Diagnosis not present

## 2019-07-26 DIAGNOSIS — D509 Iron deficiency anemia, unspecified: Secondary | ICD-10-CM | POA: Diagnosis not present

## 2019-07-28 DIAGNOSIS — Z992 Dependence on renal dialysis: Secondary | ICD-10-CM | POA: Diagnosis not present

## 2019-07-28 DIAGNOSIS — N184 Chronic kidney disease, stage 4 (severe): Secondary | ICD-10-CM | POA: Diagnosis not present

## 2019-07-28 DIAGNOSIS — N2581 Secondary hyperparathyroidism of renal origin: Secondary | ICD-10-CM | POA: Diagnosis not present

## 2019-07-28 DIAGNOSIS — D631 Anemia in chronic kidney disease: Secondary | ICD-10-CM | POA: Diagnosis not present

## 2019-07-28 DIAGNOSIS — D509 Iron deficiency anemia, unspecified: Secondary | ICD-10-CM | POA: Diagnosis not present

## 2019-07-28 DIAGNOSIS — N186 End stage renal disease: Secondary | ICD-10-CM | POA: Diagnosis not present

## 2019-07-31 DIAGNOSIS — N184 Chronic kidney disease, stage 4 (severe): Secondary | ICD-10-CM | POA: Diagnosis not present

## 2019-07-31 DIAGNOSIS — D509 Iron deficiency anemia, unspecified: Secondary | ICD-10-CM | POA: Diagnosis not present

## 2019-07-31 DIAGNOSIS — Z992 Dependence on renal dialysis: Secondary | ICD-10-CM | POA: Diagnosis not present

## 2019-07-31 DIAGNOSIS — N186 End stage renal disease: Secondary | ICD-10-CM | POA: Diagnosis not present

## 2019-07-31 DIAGNOSIS — D631 Anemia in chronic kidney disease: Secondary | ICD-10-CM | POA: Diagnosis not present

## 2019-07-31 DIAGNOSIS — N2581 Secondary hyperparathyroidism of renal origin: Secondary | ICD-10-CM | POA: Diagnosis not present

## 2019-08-02 DIAGNOSIS — Z992 Dependence on renal dialysis: Secondary | ICD-10-CM | POA: Diagnosis not present

## 2019-08-02 DIAGNOSIS — D631 Anemia in chronic kidney disease: Secondary | ICD-10-CM | POA: Diagnosis not present

## 2019-08-02 DIAGNOSIS — N186 End stage renal disease: Secondary | ICD-10-CM | POA: Diagnosis not present

## 2019-08-02 DIAGNOSIS — N184 Chronic kidney disease, stage 4 (severe): Secondary | ICD-10-CM | POA: Diagnosis not present

## 2019-08-02 DIAGNOSIS — N2581 Secondary hyperparathyroidism of renal origin: Secondary | ICD-10-CM | POA: Diagnosis not present

## 2019-08-02 DIAGNOSIS — D509 Iron deficiency anemia, unspecified: Secondary | ICD-10-CM | POA: Diagnosis not present

## 2019-08-04 DIAGNOSIS — D509 Iron deficiency anemia, unspecified: Secondary | ICD-10-CM | POA: Diagnosis not present

## 2019-08-04 DIAGNOSIS — N184 Chronic kidney disease, stage 4 (severe): Secondary | ICD-10-CM | POA: Diagnosis not present

## 2019-08-04 DIAGNOSIS — N2581 Secondary hyperparathyroidism of renal origin: Secondary | ICD-10-CM | POA: Diagnosis not present

## 2019-08-04 DIAGNOSIS — D631 Anemia in chronic kidney disease: Secondary | ICD-10-CM | POA: Diagnosis not present

## 2019-08-04 DIAGNOSIS — Z992 Dependence on renal dialysis: Secondary | ICD-10-CM | POA: Diagnosis not present

## 2019-08-04 DIAGNOSIS — N186 End stage renal disease: Secondary | ICD-10-CM | POA: Diagnosis not present

## 2019-08-05 DIAGNOSIS — N2889 Other specified disorders of kidney and ureter: Secondary | ICD-10-CM | POA: Diagnosis not present

## 2019-08-05 DIAGNOSIS — N186 End stage renal disease: Secondary | ICD-10-CM | POA: Diagnosis not present

## 2019-08-05 DIAGNOSIS — Z992 Dependence on renal dialysis: Secondary | ICD-10-CM | POA: Diagnosis not present

## 2019-08-07 DIAGNOSIS — Z992 Dependence on renal dialysis: Secondary | ICD-10-CM | POA: Diagnosis not present

## 2019-08-07 DIAGNOSIS — D509 Iron deficiency anemia, unspecified: Secondary | ICD-10-CM | POA: Diagnosis not present

## 2019-08-07 DIAGNOSIS — N186 End stage renal disease: Secondary | ICD-10-CM | POA: Diagnosis not present

## 2019-08-07 DIAGNOSIS — D631 Anemia in chronic kidney disease: Secondary | ICD-10-CM | POA: Diagnosis not present

## 2019-08-07 DIAGNOSIS — N184 Chronic kidney disease, stage 4 (severe): Secondary | ICD-10-CM | POA: Diagnosis not present

## 2019-08-07 DIAGNOSIS — N2581 Secondary hyperparathyroidism of renal origin: Secondary | ICD-10-CM | POA: Diagnosis not present

## 2019-08-09 DIAGNOSIS — N186 End stage renal disease: Secondary | ICD-10-CM | POA: Diagnosis not present

## 2019-08-09 DIAGNOSIS — D509 Iron deficiency anemia, unspecified: Secondary | ICD-10-CM | POA: Diagnosis not present

## 2019-08-09 DIAGNOSIS — N184 Chronic kidney disease, stage 4 (severe): Secondary | ICD-10-CM | POA: Diagnosis not present

## 2019-08-09 DIAGNOSIS — Z992 Dependence on renal dialysis: Secondary | ICD-10-CM | POA: Diagnosis not present

## 2019-08-09 DIAGNOSIS — D631 Anemia in chronic kidney disease: Secondary | ICD-10-CM | POA: Diagnosis not present

## 2019-08-09 DIAGNOSIS — N2581 Secondary hyperparathyroidism of renal origin: Secondary | ICD-10-CM | POA: Diagnosis not present

## 2019-08-11 DIAGNOSIS — Z992 Dependence on renal dialysis: Secondary | ICD-10-CM | POA: Diagnosis not present

## 2019-08-11 DIAGNOSIS — D509 Iron deficiency anemia, unspecified: Secondary | ICD-10-CM | POA: Diagnosis not present

## 2019-08-11 DIAGNOSIS — N2581 Secondary hyperparathyroidism of renal origin: Secondary | ICD-10-CM | POA: Diagnosis not present

## 2019-08-11 DIAGNOSIS — N184 Chronic kidney disease, stage 4 (severe): Secondary | ICD-10-CM | POA: Diagnosis not present

## 2019-08-11 DIAGNOSIS — N186 End stage renal disease: Secondary | ICD-10-CM | POA: Diagnosis not present

## 2019-08-11 DIAGNOSIS — D631 Anemia in chronic kidney disease: Secondary | ICD-10-CM | POA: Diagnosis not present

## 2019-08-14 DIAGNOSIS — N186 End stage renal disease: Secondary | ICD-10-CM | POA: Diagnosis not present

## 2019-08-14 DIAGNOSIS — Z992 Dependence on renal dialysis: Secondary | ICD-10-CM | POA: Diagnosis not present

## 2019-08-14 DIAGNOSIS — N2581 Secondary hyperparathyroidism of renal origin: Secondary | ICD-10-CM | POA: Diagnosis not present

## 2019-08-14 DIAGNOSIS — D509 Iron deficiency anemia, unspecified: Secondary | ICD-10-CM | POA: Diagnosis not present

## 2019-08-14 DIAGNOSIS — D631 Anemia in chronic kidney disease: Secondary | ICD-10-CM | POA: Diagnosis not present

## 2019-08-14 DIAGNOSIS — N184 Chronic kidney disease, stage 4 (severe): Secondary | ICD-10-CM | POA: Diagnosis not present

## 2019-08-16 DIAGNOSIS — N2581 Secondary hyperparathyroidism of renal origin: Secondary | ICD-10-CM | POA: Diagnosis not present

## 2019-08-16 DIAGNOSIS — Z992 Dependence on renal dialysis: Secondary | ICD-10-CM | POA: Diagnosis not present

## 2019-08-16 DIAGNOSIS — N184 Chronic kidney disease, stage 4 (severe): Secondary | ICD-10-CM | POA: Diagnosis not present

## 2019-08-16 DIAGNOSIS — N186 End stage renal disease: Secondary | ICD-10-CM | POA: Diagnosis not present

## 2019-08-16 DIAGNOSIS — D509 Iron deficiency anemia, unspecified: Secondary | ICD-10-CM | POA: Diagnosis not present

## 2019-08-16 DIAGNOSIS — D631 Anemia in chronic kidney disease: Secondary | ICD-10-CM | POA: Diagnosis not present

## 2019-08-18 DIAGNOSIS — N184 Chronic kidney disease, stage 4 (severe): Secondary | ICD-10-CM | POA: Diagnosis not present

## 2019-08-18 DIAGNOSIS — D509 Iron deficiency anemia, unspecified: Secondary | ICD-10-CM | POA: Diagnosis not present

## 2019-08-18 DIAGNOSIS — Z992 Dependence on renal dialysis: Secondary | ICD-10-CM | POA: Diagnosis not present

## 2019-08-18 DIAGNOSIS — D631 Anemia in chronic kidney disease: Secondary | ICD-10-CM | POA: Diagnosis not present

## 2019-08-18 DIAGNOSIS — N2581 Secondary hyperparathyroidism of renal origin: Secondary | ICD-10-CM | POA: Diagnosis not present

## 2019-08-18 DIAGNOSIS — N186 End stage renal disease: Secondary | ICD-10-CM | POA: Diagnosis not present

## 2019-08-20 ENCOUNTER — Other Ambulatory Visit: Payer: Self-pay

## 2019-08-20 ENCOUNTER — Encounter: Payer: Self-pay | Admitting: Surgery

## 2019-08-20 ENCOUNTER — Ambulatory Visit (INDEPENDENT_AMBULATORY_CARE_PROVIDER_SITE_OTHER): Payer: Medicare Other | Admitting: Surgery

## 2019-08-20 VITALS — BP 122/74 | HR 75 | Temp 98.1°F | Resp 20 | Ht 69.0 in | Wt 163.0 lb

## 2019-08-20 DIAGNOSIS — Z992 Dependence on renal dialysis: Secondary | ICD-10-CM

## 2019-08-20 DIAGNOSIS — I255 Ischemic cardiomyopathy: Secondary | ICD-10-CM

## 2019-08-20 DIAGNOSIS — N186 End stage renal disease: Secondary | ICD-10-CM | POA: Diagnosis not present

## 2019-08-20 NOTE — Progress Notes (Signed)
Vascular and Vein Specialist of [place]  Patient name: Eric [name] MRN: [id number] DOB: Mar 07, 1930 Sex: male   REASON FOR VISIT:    Follow up  HISOTRY OF PRESENT ILLNESS:    Eric [name] is a 86 y.o. male on hemodialysis via a right arm fistula, Tuesday Thursday Saturday presents with a fistula aneurysm and thinned out skin.  In April 2021, I performed aneurysmorrhaphy for a similar issue however he had skin ulceration.  A cephalic vein stenosis was also treated with an 8 mm balloon.  Patient has a history of coronary artery disease, status post CABG.  He takes a statin for hypercholesterolemia.  He is a non-smoker  PAST MEDICAL HISTORY:   Past Medical History:  Diagnosis Date  . Anxiety   . Arthritis   . CHF (congestive heart failure) (HCC)    chronic systolic CHF  . Complication of anesthesia    " one time my heart stopped due to a medication that I was on."   . Coronary artery disease   . ESRD (end stage renal disease) on dialysis ([place])    Tues, Thursday, Saturday; [facility]; [facility] (10/10/2017)  . GERD (gastroesophageal reflux disease)   . Gout   . Heart murmur    as a teen  . Herpes zoster 04/2012  . Hypertension   . Left bundle branch block   . Pneumonia 10/2018  . Shortness of breath dyspnea    with exertion- very little activity     FAMILY HISTORY:   Family History  Problem Relation Age of Onset  . Diabetes Father   . Lung cancer Father 65       non-smoker  . Cancer Mother        multiple myeloma  . Colon cancer Neg Hx     SOCIAL HISTORY:   Social History   Tobacco Use  . Smoking status: Never Smoker  . Smokeless tobacco: Never Used  Substance Use Topics  . Alcohol use: Yes    Alcohol/week: 7.0 standard drinks    Types: 7 Standard drinks or equivalent per week     ALLERGIES:   No Known Allergies   CURRENT MEDICATIONS:   Current Outpatient Medications  Medication Sig Dispense Refill   . acetaminophen (TYLENOL) 500 MG tablet Take 1,000 mg by mouth every 6 (six) hours as needed for moderate pain.    Marland Kitchen allopurinol (ZYLOPRIM) 100 MG tablet TAKE 1 TABLET BY MOUTH DAILY 90 tablet 3  . atorvastatin (LIPITOR) 20 MG tablet TAKE 1 TABLET BY MOUTH DAILY FOR CHOLESTEROL 90 tablet 3  . calcium carbonate (TUMS - DOSED IN MG ELEMENTAL CALCIUM) 500 MG chewable tablet Chew 1,500 mg by mouth 3 (three) times daily with meals.     . cinacalcet (SENSIPAR) 60 MG tablet Take 60 mg by mouth daily.     . midodrine (PROAMATINE) 10 MG tablet Take 10 mg by mouth 3 (three) times daily.    Marland Kitchen omeprazole (PRILOSEC) 20 MG capsule Take 20 mg by mouth daily.     Marland Kitchen oxyCODONE (ROXICODONE) 5 MG immediate release tablet Take 1 tablet (5 mg total) by mouth every 8 (eight) hours as needed. (Patient not taking: Reported on 08/20/2019) 20 tablet 0   No current facility-administered medications for this visit.    REVIEW OF SYSTEMS:   [X] denotes positive finding, [ ] denotes negative finding Cardiac  Comments:  Chest pain or chest pressure:    Shortness of breath upon exertion:    Short  of breath when lying flat:    Irregular heart rhythm:        Vascular    Pain in calf, thigh, or hip brought on by ambulation:    Pain in feet at night that wakes you up from your sleep:     Blood clot in your veins:    Leg swelling:         Pulmonary    Oxygen at home:    Productive cough:     Wheezing:         Neurologic    Sudden weakness in arms or legs:     Sudden numbness in arms or legs:     Sudden onset of difficulty speaking or slurred speech:    Temporary loss of vision in one eye:     Problems with dizziness:         Gastrointestinal    Blood in stool:     Vomited blood:         Genitourinary    Burning when urinating:     Blood in urine:        Psychiatric    Major depression:         Hematologic    Bleeding problems:    Problems with blood clotting too easily:        Skin    Rashes or  ulcers:        Constitutional    Fever or chills:      PHYSICAL EXAM:   Vitals:   08/20/19 1103  BP: 122/74  Pulse: 75  Resp: 20  Temp: 98.1 F (36.7 C)  SpO2: 96%  Weight: 163 lb (73.9 kg)  Height: 5' 9" (1.753 m)    GENERAL: The patient is a well-nourished male, in no acute distress. The vital signs are documented above. CARDIAC: There is a regular rate and rhythm.  VASCULAR: Aneurysmal right brachiocephalic fistula PULMONARY: Non-labored respirations ABDOMEN: Soft and non-tender with normal pitched bowel sounds.  MUSCULOSKELETAL: There are no major deformities or cyanosis. NEUROLOGIC: No focal weakness or paresthesias are detected. SKIN: There are no ulcers or rashes noted. PSYCHIATRIC: The patient has a normal affect.  STUDIES:   None  MEDICAL ISSUES:   We discussed proceeding with plication of his fistula as well as performing a fistulogram to evaluate for stenosis.  This is been scheduled for Wednesday, August 25    Wells Zyionna Pesce, IV, MD, FACS [facility] [place] Tel [phone] Pager [phone] 

## 2019-08-20 NOTE — H&P (View-Only) (Signed)
Vascular and Vein Specialist of [place]  Patient name: Eric [name] MRN: [id number] DOB: 1930-02-02 Sex: male   REASON FOR VISIT:    Follow up  HISOTRY OF PRESENT ILLNESS:    Eric [name] is a 86 y.o. male on hemodialysis via a right arm fistula, Tuesday Thursday Saturday presents with a fistula aneurysm and thinned out skin.  In April 2021, I performed aneurysmorrhaphy for a similar issue however he had skin ulceration.  A cephalic vein stenosis was also treated with an 8 mm balloon.  Patient has a history of coronary artery disease, status post CABG.  He takes a statin for hypercholesterolemia.  He is a non-smoker  PAST MEDICAL HISTORY:   Past Medical History:  Diagnosis Date  . Anxiety   . Arthritis   . CHF (congestive heart failure) (HCC)    chronic systolic CHF  . Complication of anesthesia    " one time my heart stopped due to a medication that I was on."   . Coronary artery disease   . ESRD (end stage renal disease) on dialysis ([place])    Tues, Thursday, Saturday; [facility]; [place] (10/10/2017)  . GERD (gastroesophageal reflux disease)   . Gout   . Heart murmur    as a teen  . Herpes zoster 04/2012  . Hypertension   . Left bundle branch block   . Pneumonia 10/2018  . Shortness of breath dyspnea    with exertion- very little activity     FAMILY HISTORY:   Family History  Problem Relation Age of Onset  . Diabetes Father   . Lung cancer Father 59       non-smoker  . Cancer Mother        multiple myeloma  . Colon cancer Neg Hx     SOCIAL HISTORY:   Social History   Tobacco Use  . Smoking status: Never Smoker  . Smokeless tobacco: Never Used  Substance Use Topics  . Alcohol use: Yes    Alcohol/week: 7.0 standard drinks    Types: 7 Standard drinks or equivalent per week     ALLERGIES:   No Known Allergies   CURRENT MEDICATIONS:   Current Outpatient Medications  Medication Sig Dispense Refill   . acetaminophen (TYLENOL) 500 MG tablet Take 1,000 mg by mouth every 6 (six) hours as needed for moderate pain.    Marland Kitchen allopurinol (ZYLOPRIM) 100 MG tablet TAKE 1 TABLET BY MOUTH DAILY 90 tablet 3  . atorvastatin (LIPITOR) 20 MG tablet TAKE 1 TABLET BY MOUTH DAILY FOR CHOLESTEROL 90 tablet 3  . calcium carbonate (TUMS - DOSED IN MG ELEMENTAL CALCIUM) 500 MG chewable tablet Chew 1,500 mg by mouth 3 (three) times daily with meals.     . cinacalcet (SENSIPAR) 60 MG tablet Take 60 mg by mouth daily.     . midodrine (PROAMATINE) 10 MG tablet Take 10 mg by mouth 3 (three) times daily.    Marland Kitchen omeprazole (PRILOSEC) 20 MG capsule Take 20 mg by mouth daily.     Marland Kitchen oxyCODONE (ROXICODONE) 5 MG immediate release tablet Take 1 tablet (5 mg total) by mouth every 8 (eight) hours as needed. (Patient not taking: Reported on 08/20/2019) 20 tablet 0   No current facility-administered medications for this visit.    REVIEW OF SYSTEMS:   [X] denotes positive finding, [ ] denotes negative finding Cardiac  Comments:  Chest pain or chest pressure:    Shortness of breath upon exertion:    Short  of breath when lying flat:    Irregular heart rhythm:        Vascular    Pain in calf, thigh, or hip brought on by ambulation:    Pain in feet at night that wakes you up from your sleep:     Blood clot in your veins:    Leg swelling:         Pulmonary    Oxygen at home:    Productive cough:     Wheezing:         Neurologic    Sudden weakness in arms or legs:     Sudden numbness in arms or legs:     Sudden onset of difficulty speaking or slurred speech:    Temporary loss of vision in one eye:     Problems with dizziness:         Gastrointestinal    Blood in stool:     Vomited blood:         Genitourinary    Burning when urinating:     Blood in urine:        Psychiatric    Major depression:         Hematologic    Bleeding problems:    Problems with blood clotting too easily:        Skin    Rashes or  ulcers:        Constitutional    Fever or chills:      PHYSICAL EXAM:   Vitals:   08/20/19 1103  BP: 122/74  Pulse: 75  Resp: 20  Temp: 98.1 F (36.7 C)  SpO2: 96%  Weight: 163 lb (73.9 kg)  Height: 5' 9" (1.753 m)    GENERAL: The patient is a well-nourished male, in no acute distress. The vital signs are documented above. CARDIAC: There is a regular rate and rhythm.  VASCULAR: Aneurysmal right brachiocephalic fistula PULMONARY: Non-labored respirations ABDOMEN: Soft and non-tender with normal pitched bowel sounds.  MUSCULOSKELETAL: There are no major deformities or cyanosis. NEUROLOGIC: No focal weakness or paresthesias are detected. SKIN: There are no ulcers or rashes noted. PSYCHIATRIC: The patient has a normal affect.  STUDIES:   None  MEDICAL ISSUES:   We discussed proceeding with plication of his fistula as well as performing a fistulogram to evaluate for stenosis.  This is been scheduled for Wednesday, August 25    Leia Alf, MD, FACS [facility] Special[facility] [phone] Pager [phone]

## 2019-08-21 DIAGNOSIS — N184 Chronic kidney disease, stage 4 (severe): Secondary | ICD-10-CM | POA: Diagnosis not present

## 2019-08-21 DIAGNOSIS — Z992 Dependence on renal dialysis: Secondary | ICD-10-CM | POA: Diagnosis not present

## 2019-08-21 DIAGNOSIS — N2581 Secondary hyperparathyroidism of renal origin: Secondary | ICD-10-CM | POA: Diagnosis not present

## 2019-08-21 DIAGNOSIS — D631 Anemia in chronic kidney disease: Secondary | ICD-10-CM | POA: Diagnosis not present

## 2019-08-21 DIAGNOSIS — N186 End stage renal disease: Secondary | ICD-10-CM | POA: Diagnosis not present

## 2019-08-21 DIAGNOSIS — D509 Iron deficiency anemia, unspecified: Secondary | ICD-10-CM | POA: Diagnosis not present

## 2019-08-23 DIAGNOSIS — N2581 Secondary hyperparathyroidism of renal origin: Secondary | ICD-10-CM | POA: Diagnosis not present

## 2019-08-23 DIAGNOSIS — D509 Iron deficiency anemia, unspecified: Secondary | ICD-10-CM | POA: Diagnosis not present

## 2019-08-23 DIAGNOSIS — Z992 Dependence on renal dialysis: Secondary | ICD-10-CM | POA: Diagnosis not present

## 2019-08-23 DIAGNOSIS — N184 Chronic kidney disease, stage 4 (severe): Secondary | ICD-10-CM | POA: Diagnosis not present

## 2019-08-23 DIAGNOSIS — D631 Anemia in chronic kidney disease: Secondary | ICD-10-CM | POA: Diagnosis not present

## 2019-08-23 DIAGNOSIS — N186 End stage renal disease: Secondary | ICD-10-CM | POA: Diagnosis not present

## 2019-08-25 DIAGNOSIS — Z992 Dependence on renal dialysis: Secondary | ICD-10-CM | POA: Diagnosis not present

## 2019-08-25 DIAGNOSIS — N2581 Secondary hyperparathyroidism of renal origin: Secondary | ICD-10-CM | POA: Diagnosis not present

## 2019-08-25 DIAGNOSIS — N184 Chronic kidney disease, stage 4 (severe): Secondary | ICD-10-CM | POA: Diagnosis not present

## 2019-08-25 DIAGNOSIS — D631 Anemia in chronic kidney disease: Secondary | ICD-10-CM | POA: Diagnosis not present

## 2019-08-25 DIAGNOSIS — D509 Iron deficiency anemia, unspecified: Secondary | ICD-10-CM | POA: Diagnosis not present

## 2019-08-25 DIAGNOSIS — N186 End stage renal disease: Secondary | ICD-10-CM | POA: Diagnosis not present

## 2019-08-27 ENCOUNTER — Other Ambulatory Visit (HOSPITAL_COMMUNITY)
Admission: RE | Admit: 2019-08-27 | Discharge: 2019-08-27 | Disposition: A | Discharge status: Home / Self Care | Payer: Medicare Other | Source: Ambulatory Visit | Attending: Surgery | Admitting: Surgery

## 2019-08-27 ENCOUNTER — Telehealth: Payer: Self-pay | Admitting: *Deleted

## 2019-08-27 DIAGNOSIS — Z20822 Contact with and (suspected) exposure to covid-19: Secondary | ICD-10-CM | POA: Insufficient documentation

## 2019-08-27 DIAGNOSIS — Z01812 Encounter for preprocedural laboratory examination: Secondary | ICD-10-CM | POA: Insufficient documentation

## 2019-08-27 LAB — SARS CORONAVIRUS 2 (TAT 6-24 HRS): SARS Coronavirus 2: NEGATIVE

## 2019-08-27 NOTE — Telephone Encounter (Signed)
Informed patient of procedure time change. He is to be at [facility] at 0930.

## 2019-08-28 ENCOUNTER — Encounter (HOSPITAL_COMMUNITY): Payer: Self-pay | Admitting: Surgery

## 2019-08-28 ENCOUNTER — Other Ambulatory Visit: Payer: Self-pay

## 2019-08-28 DIAGNOSIS — D631 Anemia in chronic kidney disease: Secondary | ICD-10-CM | POA: Diagnosis not present

## 2019-08-28 DIAGNOSIS — Z992 Dependence on renal dialysis: Secondary | ICD-10-CM | POA: Diagnosis not present

## 2019-08-28 DIAGNOSIS — D509 Iron deficiency anemia, unspecified: Secondary | ICD-10-CM | POA: Diagnosis not present

## 2019-08-28 DIAGNOSIS — N184 Chronic kidney disease, stage 4 (severe): Secondary | ICD-10-CM | POA: Diagnosis not present

## 2019-08-28 DIAGNOSIS — N2581 Secondary hyperparathyroidism of renal origin: Secondary | ICD-10-CM | POA: Diagnosis not present

## 2019-08-28 DIAGNOSIS — N186 End stage renal disease: Secondary | ICD-10-CM | POA: Diagnosis not present

## 2019-08-28 NOTE — Progress Notes (Signed)
Eric [name] denies chest pain or shortness of breath. Patient tested negative  for Covid 08/27/19 and has been in quarantine since that time. Eric [name] lives with his wife in [place] at [place].  [facility] will transport home.  Patient 's wife Eric [name] will be waking for a call from Dr. Trula Slade.

## 2019-08-29 ENCOUNTER — Ambulatory Visit (HOSPITAL_COMMUNITY): Payer: Medicare Other

## 2019-08-29 ENCOUNTER — Encounter (HOSPITAL_COMMUNITY)
Admission: RE | Disposition: A | Discharge status: Home / Self Care | Payer: Self-pay | Source: Home / Self Care | Attending: Surgery

## 2019-08-29 ENCOUNTER — Encounter (HOSPITAL_COMMUNITY): Payer: Self-pay | Admitting: Surgery

## 2019-08-29 ENCOUNTER — Ambulatory Visit (HOSPITAL_COMMUNITY)
Admission: RE | Admit: 2019-08-29 | Discharge: 2019-08-29 | Disposition: A | Discharge status: Home / Self Care | Payer: Medicare Other | Attending: Surgery | Admitting: Surgery

## 2019-08-29 DIAGNOSIS — F419 Anxiety disorder, unspecified: Secondary | ICD-10-CM | POA: Diagnosis not present

## 2019-08-29 DIAGNOSIS — T82898A Other specified complication of vascular prosthetic devices, implants and grafts, initial encounter: Secondary | ICD-10-CM | POA: Diagnosis not present

## 2019-08-29 DIAGNOSIS — M109 Gout, unspecified: Secondary | ICD-10-CM | POA: Diagnosis not present

## 2019-08-29 DIAGNOSIS — Z992 Dependence on renal dialysis: Secondary | ICD-10-CM | POA: Insufficient documentation

## 2019-08-29 DIAGNOSIS — I2510 Atherosclerotic heart disease of native coronary artery without angina pectoris: Secondary | ICD-10-CM | POA: Diagnosis not present

## 2019-08-29 DIAGNOSIS — N186 End stage renal disease: Secondary | ICD-10-CM | POA: Diagnosis not present

## 2019-08-29 DIAGNOSIS — I132 Hypertensive heart and chronic kidney disease with heart failure and with stage 5 chronic kidney disease, or end stage renal disease: Secondary | ICD-10-CM | POA: Diagnosis not present

## 2019-08-29 DIAGNOSIS — Z951 Presence of aortocoronary bypass graft: Secondary | ICD-10-CM | POA: Insufficient documentation

## 2019-08-29 DIAGNOSIS — E7800 Pure hypercholesterolemia, unspecified: Secondary | ICD-10-CM | POA: Insufficient documentation

## 2019-08-29 DIAGNOSIS — Z79899 Other long term (current) drug therapy: Secondary | ICD-10-CM | POA: Diagnosis not present

## 2019-08-29 DIAGNOSIS — I5022 Chronic systolic (congestive) heart failure: Secondary | ICD-10-CM | POA: Diagnosis not present

## 2019-08-29 DIAGNOSIS — K219 Gastro-esophageal reflux disease without esophagitis: Secondary | ICD-10-CM | POA: Diagnosis not present

## 2019-08-29 DIAGNOSIS — T82590A Other mechanical complication of surgically created arteriovenous fistula, initial encounter: Secondary | ICD-10-CM | POA: Diagnosis not present

## 2019-08-29 DIAGNOSIS — I447 Left bundle-branch block, unspecified: Secondary | ICD-10-CM | POA: Insufficient documentation

## 2019-08-29 HISTORY — PX: FISTULA SUPERFICIALIZATION: SHX6341

## 2019-08-29 HISTORY — PX: FISTULOGRAM: SHX5832

## 2019-08-29 HISTORY — PX: VENOPLASTY: SHX6195

## 2019-08-29 HISTORY — PX: RESECTION OF ARTERIOVENOUS FISTULA ANEURYSM: SHX6070

## 2019-08-29 LAB — POCT I-STAT, CHEM 8
BUN: 43 mg/dL — ABNORMAL HIGH (ref 8–23)
Calcium, Ion: 1.14 mmol/L — ABNORMAL LOW (ref 1.15–1.40)
Chloride: 97 mmol/L — ABNORMAL LOW (ref 98–111)
Creatinine, Ser: 7.2 mg/dL — ABNORMAL HIGH (ref 0.61–1.24)
Glucose, Bld: 110 mg/dL — ABNORMAL HIGH (ref 70–99)
HCT: 36 % — ABNORMAL LOW (ref 39.0–52.0)
Hemoglobin: 12.2 g/dL — ABNORMAL LOW (ref 13.0–17.0)
Potassium: 4.3 mmol/L (ref 3.5–5.1)
Sodium: 138 mmol/L (ref 135–145)
TCO2: 28 mmol/L (ref 22–32)

## 2019-08-29 SURGERY — FISTULA SUPERFICIALIZATION
Anesthesia: Monitor Anesthesia Care | Site: Arm Upper | Laterality: Right

## 2019-08-29 MED ORDER — FENTANYL CITRATE (PF) 100 MCG/2ML IJ SOLN
INTRAMUSCULAR | Status: DC | PRN
Start: 2019-08-29 — End: 2019-08-29
  Administered 2019-08-29 (×2): 25 ug via INTRAVENOUS

## 2019-08-29 MED ORDER — SODIUM CHLORIDE 0.9 % IV SOLN: INTRAVENOUS | Status: DC

## 2019-08-29 MED ORDER — PROPOFOL 500 MG/50ML IV EMUL
INTRAVENOUS | Status: DC | PRN
  Administered 2019-08-29: 50 ug/kg/min via INTRAVENOUS

## 2019-08-29 MED ORDER — ONDANSETRON HCL 4 MG/2ML IJ SOLN
INTRAMUSCULAR | Status: AC
  Filled 2019-08-29: qty 2

## 2019-08-29 MED ORDER — FENTANYL CITRATE (PF) 250 MCG/5ML IJ SOLN
INTRAMUSCULAR | Status: AC
  Filled 2019-08-29: qty 5

## 2019-08-29 MED ORDER — CEFAZOLIN SODIUM-DEXTROSE 2-4 GM/100ML-% IV SOLN
2.0000 g | INTRAVENOUS | Status: AC
  Administered 2019-08-29: 2 g via INTRAVENOUS

## 2019-08-29 MED ORDER — CHLORHEXIDINE GLUCONATE 4 % EX LIQD: 60.0000 mL | Freq: Once | CUTANEOUS | Status: DC

## 2019-08-29 MED ORDER — IODIXANOL 320 MG/ML IV SOLN
INTRAVENOUS | Status: DC | PRN
  Administered 2019-08-29: 15 mL

## 2019-08-29 MED ORDER — EPHEDRINE SULFATE 50 MG/ML IJ SOLN
INTRAMUSCULAR | Status: DC | PRN
  Administered 2019-08-29 (×3): 5 mg via INTRAVENOUS

## 2019-08-29 MED ORDER — LIDOCAINE-EPINEPHRINE (PF) 1 %-1:200000 IJ SOLN
INTRAMUSCULAR | Status: DC | PRN
  Administered 2019-08-29: 14 mL

## 2019-08-29 MED ORDER — MIDAZOLAM HCL 2 MG/2ML IJ SOLN
INTRAMUSCULAR | Status: AC
  Filled 2019-08-29: qty 2

## 2019-08-29 MED ORDER — SODIUM CHLORIDE 0.9 % IV SOLN
INTRAVENOUS | Status: AC
  Filled 2019-08-29: qty 1.2

## 2019-08-29 MED ORDER — CEFAZOLIN SODIUM-DEXTROSE 2-4 GM/100ML-% IV SOLN
INTRAVENOUS | Status: AC
  Filled 2019-08-29: qty 100

## 2019-08-29 MED ORDER — CHLORHEXIDINE GLUCONATE 0.12 % MT SOLN
OROMUCOSAL | Status: AC
  Administered 2019-08-29: 15 mL via OROMUCOSAL
  Filled 2019-08-29: qty 15

## 2019-08-29 MED ORDER — EPHEDRINE 5 MG/ML INJ
INTRAVENOUS | Status: AC
  Filled 2019-08-29: qty 10

## 2019-08-29 MED ORDER — ONDANSETRON HCL 4 MG/2ML IJ SOLN
INTRAMUSCULAR | Status: DC | PRN
  Administered 2019-08-29: 4 mg via INTRAVENOUS

## 2019-08-29 MED ORDER — SODIUM CHLORIDE 0.9 % IV SOLN
INTRAVENOUS | Status: DC | PRN
  Administered 2019-08-29: 500 mL

## 2019-08-29 MED ORDER — OXYCODONE-ACETAMINOPHEN 5-325 MG PO TABS
1.0000 | ORAL_TABLET | ORAL | 0 refills | Status: DC | PRN
Start: 2019-08-29 — End: 2019-10-17

## 2019-08-29 MED ORDER — CHLORHEXIDINE GLUCONATE 0.12 % MT SOLN: 15.0000 mL | Freq: Once | OROMUCOSAL | Status: AC

## 2019-08-29 MED ORDER — LIDOCAINE-EPINEPHRINE (PF) 1 %-1:200000 IJ SOLN
INTRAMUSCULAR | Status: AC
  Filled 2019-08-29: qty 30

## 2019-08-29 MED ORDER — 0.9 % SODIUM CHLORIDE (POUR BTL) OPTIME
TOPICAL | Status: DC | PRN
  Administered 2019-08-29: 1000 mL

## 2019-08-29 SURGICAL SUPPLY — 56 items
ARMBAND PINK RESTRICT EXTREMIT (MISCELLANEOUS) ×2 IMPLANT
BAG BANDED W/RUBBER/TAPE 36X54 (MISCELLANEOUS) ×2 IMPLANT
BALLN MUSTANG 8X60X75 (BALLOONS) ×2
BALLOON MUSTANG 8X60X75 (BALLOONS) ×1 IMPLANT
BNDG COHESIVE 4X5 TAN STRL (GAUZE/BANDAGES/DRESSINGS) ×2 IMPLANT
CANISTER SUCT 3000ML PPV (MISCELLANEOUS) ×2 IMPLANT
CATH BEACON 5 .035 40 KMP TP (CATHETERS) ×1 IMPLANT
CATH BEACON 5 .038 40 KMP TP (CATHETERS) ×1
CHLORAPREP W/TINT 10.5 ML (MISCELLANEOUS) IMPLANT
CLIP VESOCCLUDE MED 6/CT (CLIP) ×2 IMPLANT
CLIP VESOCCLUDE SM WIDE 6/CT (CLIP) ×2 IMPLANT
COVER DOME SNAP 22 D (MISCELLANEOUS) ×4 IMPLANT
COVER PROBE W GEL 5X96 (DRAPES) ×2 IMPLANT
COVER WAND RF STERILE (DRAPES) IMPLANT
DERMABOND ADVANCED (GAUZE/BANDAGES/DRESSINGS) ×1
DERMABOND ADVANCED .7 DNX12 (GAUZE/BANDAGES/DRESSINGS) ×1 IMPLANT
DRAPE BRACHIAL (DRAPES) IMPLANT
DRSG TEGADERM 4X4.75 (GAUZE/BANDAGES/DRESSINGS) ×2 IMPLANT
ELECT CAUTERY BLADE 6.4 (BLADE) ×2 IMPLANT
ELECT REM PT RETURN 9FT ADLT (ELECTROSURGICAL) ×2
ELECTRODE REM PT RTRN 9FT ADLT (ELECTROSURGICAL) ×1 IMPLANT
GEL ULTRASOUND 8.5O AQUASONIC (MISCELLANEOUS) ×2 IMPLANT
GLOVE BIOGEL PI IND STRL 7.5 (GLOVE) ×1 IMPLANT
GLOVE BIOGEL PI IND STRL 8 (GLOVE) ×1 IMPLANT
GLOVE BIOGEL PI INDICATOR 7.5 (GLOVE) ×1
GLOVE BIOGEL PI INDICATOR 8 (GLOVE) ×1
GLOVE SURG SS PI 7.5 STRL IVOR (GLOVE) ×2 IMPLANT
GOWN STRL REUS W/ TWL LRG LVL3 (GOWN DISPOSABLE) ×1 IMPLANT
GOWN STRL REUS W/ TWL XL LVL3 (GOWN DISPOSABLE) ×2 IMPLANT
GOWN STRL REUS W/TWL LRG LVL3 (GOWN DISPOSABLE) ×1
GOWN STRL REUS W/TWL XL LVL3 (GOWN DISPOSABLE) ×2
HEMOSTAT SNOW SURGICEL 2X4 (HEMOSTASIS) IMPLANT
KIT BASIN OR (CUSTOM PROCEDURE TRAY) ×2 IMPLANT
KIT ENCORE 26 ADVANTAGE (KITS) ×4 IMPLANT
KIT TURNOVER KIT B (KITS) ×2 IMPLANT
NEEDLE PERC 18GX7CM (NEEDLE) ×2 IMPLANT
NS IRRIG 1000ML POUR BTL (IV SOLUTION) ×2 IMPLANT
PACK CV ACCESS (CUSTOM PROCEDURE TRAY) ×2 IMPLANT
PACK ENDO MINOR (CUSTOM PROCEDURE TRAY) IMPLANT
PAD ARMBOARD 7.5X6 YLW CONV (MISCELLANEOUS) ×4 IMPLANT
SET MICROPUNCTURE 5F STIFF (MISCELLANEOUS) ×2 IMPLANT
SHEATH PINNACLE 6F 10CM (SHEATH) ×2 IMPLANT
STOPCOCK MORSE 400PSI 3WAY (MISCELLANEOUS) ×2 IMPLANT
SUT PROLENE 5 0 C 1 24 (SUTURE) ×6 IMPLANT
SUT PROLENE 6 0 CC (SUTURE) ×2 IMPLANT
SUT VIC AB 3-0 SH 27 (SUTURE) ×1
SUT VIC AB 3-0 SH 27X BRD (SUTURE) ×1 IMPLANT
SUT VICRYL 4-0 PS2 18IN ABS (SUTURE) ×2 IMPLANT
SYR 10ML LL (SYRINGE) ×6 IMPLANT
SYR 20ML LL LF (SYRINGE) ×4 IMPLANT
SYR CONTROL 10ML LL (SYRINGE) ×2 IMPLANT
TOWEL GREEN STERILE (TOWEL DISPOSABLE) ×2 IMPLANT
TUBING CIL FLEX 10 FLL-RA (TUBING) ×2 IMPLANT
UNDERPAD 30X36 HEAVY ABSORB (UNDERPADS AND DIAPERS) ×2 IMPLANT
WATER STERILE IRR 1000ML POUR (IV SOLUTION) ×2 IMPLANT
WIRE BENTSON .035X145CM (WIRE) ×2 IMPLANT

## 2019-08-29 NOTE — Transfer of Care (Cosign Needed)
Immediate Anesthesia Transfer of Care Note  Patient: Eric [name]  Procedure(s) Performed: RIGHT ARTERIOVENOUS FISTULA  PLICATION (Right Arm Upper) FISTULOGRAM (Right Arm Upper) PERIPHERAL VENOPLASTY (Right Arm Upper) RESECTION OF ARTERIOVENOUS FISTULA ANEURYSM (Right Arm Upper)  Patient Location: PACU  Anesthesia Type:MAC  Level of Consciousness: awake, alert  and oriented  Airway & Oxygen Therapy: Patient Spontanous Breathing  Post-op Assessment: Report given to RN, Post -op Vital signs reviewed and stable and Patient moving all extremities  Post vital signs: Reviewed and stable  Last Vitals:  Vitals Value Taken Time  BP 108/83 08/29/19 1149  Temp    Pulse 70 08/29/19 1149  Resp 12 08/29/19 1149  SpO2 97 % 08/29/19 1149  Vitals shown include unvalidated device data.  Last Pain:  Vitals:   08/29/19 0936  TempSrc: Oral  PainSc:       Patients Stated Pain Goal: 0 (47/15/80 6386)  Complications: No complications documented.

## 2019-08-29 NOTE — Anesthesia Preprocedure Evaluation (Addendum)
Anesthesia Evaluation  Patient identified by MRN, date of birth, ID band Patient awake    Reviewed: Allergy & Precautions, NPO status , Patient's Chart, lab work & pertinent test results  Airway Mallampati: III  TM Distance: >3 FB Neck ROM: Full    Dental  (+) Teeth Intact, Dental Advisory Given   Pulmonary     + decreased breath sounds      Cardiovascular hypertension, + CAD, + CABG and +CHF  + dysrhythmias  Rhythm:Regular Rate:Normal  - s/p AVR   Neuro/Psych Anxiety negative neurological ROS     GI/Hepatic Neg liver ROS, GERD  Medicated,  Endo/Other  negative endocrine ROS  Renal/GU ESRF and DialysisRenal disease     Musculoskeletal  (+) Arthritis ,   Abdominal Normal abdominal exam  (+)   Peds  Hematology   Anesthesia Other Findings   Reproductive/Obstetrics                            Anesthesia Physical Anesthesia Plan  ASA: III  Anesthesia Plan: MAC   Post-op Pain Management:    Induction: Intravenous  PONV Risk Score and Plan: 2 and Propofol infusion and Ondansetron  Airway Management Planned: Natural Airway and Simple Face Mask  Additional Equipment: None  Intra-op Plan:   Post-operative Plan:   Informed Consent: I have reviewed the patients History and Physical, chart, labs and discussed the procedure including the risks, benefits and alternatives for the proposed anesthesia with the patient or authorized representative who has indicated his/her understanding and acceptance.       Plan Discussed with: CRNA  Anesthesia Plan Comments: (Lab Results      Component                Value               Date                      WBC                      8.3                 11/04/2018                HGB                      12.2 (L)            08/29/2019                HCT                      36.0 (L)            08/29/2019                MCV                      104.0  (H)           11/04/2018                PLT                      161                 11/04/2018           )  Anesthesia Quick Evaluation  

## 2019-08-29 NOTE — Anesthesia Postprocedure Evaluation (Signed)
Anesthesia Post Note  Patient: Eric [name]  Procedure(s) Performed: RIGHT ARTERIOVENOUS FISTULA  PLICATION (Right Arm Upper) FISTULOGRAM (Right Arm Upper) PERIPHERAL VENOPLASTY (Right Arm Upper) RESECTION OF ARTERIOVENOUS FISTULA ANEURYSM (Right Arm Upper)     Patient location during evaluation: PACU Anesthesia Type: MAC Level of consciousness: awake and alert Pain management: pain level controlled Vital Signs Assessment: post-procedure vital signs reviewed and stable Respiratory status: spontaneous breathing, nonlabored ventilation, respiratory function stable and patient connected to nasal cannula oxygen Cardiovascular status: stable and blood pressure returned to baseline Postop Assessment: no apparent nausea or vomiting Anesthetic complications: no   No complications documented.  Last Vitals:  Vitals:   08/29/19 1212 08/29/19 1215  BP: (!) 142/59 (!) 142/59  Pulse: 72 66  Resp: 16 14  Temp:  (!) 36.1 C  SpO2: 100% 98%    Last Pain:  Vitals:   08/29/19 1215  TempSrc:   PainSc: 0-No pain                 Effie Berkshire

## 2019-08-29 NOTE — Interval H&P Note (Signed)
History and Physical Interval Note:  08/29/2019 9:52 AM  Eric [name]  has presented today for surgery, with the diagnosis of END STAGE RENAL DISEASE.  The various methods of treatment have been discussed with the patient and family. After consideration of risks, benefits and other options for treatment, the patient has consented to  Procedure(s): RIGHT ARTERIOVENOUS FISTULA PLICATION (Right) FISTULOGRAM (Right) as a surgical intervention.  The patient's history has been reviewed, patient examined, no change in status, stable for surgery.  I have reviewed the patient's chart and labs.  Questions were answered to the patient's satisfaction.     [name]

## 2019-08-29 NOTE — Op Note (Signed)
    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 MAC anesthesia was administered.  The patient was prepped and draped in the usual sterile fashion.  A time out was called and antibiotics were administered.  A PA was necessary for the technical details of the repair.  1% lidocaine was used for local anesthesia.  An eliptical incision was made over the aneurysm.  The aneurysm was fully mobilized and encircled proximally and distally with Vesseloops.  I then cannulated the fistula with an 18-gauge needle.  A Bentson wire was inserted into the central venous system.  A fistulogram was performed which showed a patent fistula however at the cephalic vein junction there was approximately 70% stenosis a 8 x 60 Mustang balloon was used to perform balloon venoplasty taking the balloon to 12 atm.  Completion imaging showed resolution of the stenosis.  The sheath and wire were then removed and the fistula was occluded with baby Gregory clamps.  I then removed approximately 50% of the fistula aneurysm with an 11 blade and Potts scissors.  I then closed the fistula with 2 layers of 5-0 Prolene.  The clamps were released.  There was an excellent thrill within the fistula.  Hemostasis was achieved.  The wound was irrigated.  The incision was closed with 2 layers of 3-0 Vicryl followed by Dermabond.  The  patient was taken recovery in stable condition.   Disposition: To PACU stable.   Theotis Burrow, M.D., [facility] [place] Office: [phone] Pager:  [phone]

## 2019-08-29 NOTE — Discharge Instructions (Signed)
[facility]  Discharge Instructions  AV Fistula or Graft Surgery for Dialysis Access  Please refer to the following instructions for your post-procedure care. Your surgeon or physician assistant will discuss any changes with you.  Activity  You may drive the day following your surgery, if you are comfortable and no longer taking prescription pain medication. Resume full activity as the soreness in your incision resolves.  Bathing/Showering  You may shower after you go home. Keep your incision dry for 48 hours. Do not soak in a bathtub, hot tub, or swim until the incision heals completely. You may not shower if you have a hemodialysis catheter.  Incision Care  Clean your incision with mild soap and water after 48 hours. Pat the area dry with a clean towel. You do not need a bandage unless otherwise instructed. Do not apply any ointments or creams to your incision. You may have skin glue on your incision. Do not peel it off. It will come off on its own in about one week. Your arm may swell a bit after surgery. To reduce swelling use pillows to elevate your arm so it is above your heart. Your doctor will tell you if you need to lightly wrap your arm with an ACE bandage.  Diet  Resume your normal diet. There are not special food restrictions following this procedure. In order to heal from your surgery, it is CRITICAL to get adequate nutrition. Your body requires vitamins, minerals, and protein. Vegetables are the best source of vitamins and minerals. Vegetables also provide the perfect balance of protein. Processed food has little nutritional value, so try to avoid this.  Medications  Resume taking all of your medications. If your incision is causing pain, you may take over-the counter pain relievers such as acetaminophen (Tylenol). If you were prescribed a stronger pain medication, please be aware these medications can cause nausea and constipation. Prevent  nausea by taking the medication with a snack or meal. Avoid constipation by drinking plenty of fluids and eating foods with high amount of fiber, such as fruits, vegetables, and grains.  Do not take Tylenol if you are taking prescription pain medications.  Follow up Your surgeon may want to see you in the office following your access surgery. If so, this will be arranged at the time of your surgery.  Please call us immediately for any of the following conditions:  . Increased pain, redness, drainage (pus) from your incision site . Fever of 101 degrees or higher . Severe or worsening pain at your incision site . Hand pain or numbness. .  Reduce your risk of vascular disease:  . Stop smoking. If you would like help, call [facility] at 1-800-QUIT-NOW (952) 329-8901) or [place] at 901-037-5588  . Manage your cholesterol . Maintain a desired weight . Control your diabetes . Keep your blood pressure down  Dialysis  It will take several weeks to several months for your new dialysis access to be ready for use. Your surgeon will determine when it is okay to use it. Your nephrologist will continue to direct your dialysis. You can continue to use your Permcath until your new access is ready for use.   08/29/2019 Eric [name] [id number] Jul 15, 1930  Surgeon(s): Serafina Mitchell, MD  Procedure(s): RIGHT ARTERIOVENOUS FISTULA  PLICATION FISTULOGRAM PERIPHERAL VENOPLASTY RESECTION OF ARTERIOVENOUS FISTULA ANEURYSM   May stick graft immediately  X May stick fistula in designated area only: see picture   X Do not stick  right AV fistula in incisional area for 4-6  weeks    If you have any questions, please call the office at [phone].

## 2019-08-30 ENCOUNTER — Encounter (HOSPITAL_COMMUNITY): Payer: Self-pay | Admitting: Surgery

## 2019-08-30 DIAGNOSIS — D509 Iron deficiency anemia, unspecified: Secondary | ICD-10-CM | POA: Diagnosis not present

## 2019-08-30 DIAGNOSIS — D631 Anemia in chronic kidney disease: Secondary | ICD-10-CM | POA: Diagnosis not present

## 2019-08-30 DIAGNOSIS — Z992 Dependence on renal dialysis: Secondary | ICD-10-CM | POA: Diagnosis not present

## 2019-08-30 DIAGNOSIS — N186 End stage renal disease: Secondary | ICD-10-CM | POA: Diagnosis not present

## 2019-08-30 DIAGNOSIS — N184 Chronic kidney disease, stage 4 (severe): Secondary | ICD-10-CM | POA: Diagnosis not present

## 2019-08-30 DIAGNOSIS — N2581 Secondary hyperparathyroidism of renal origin: Secondary | ICD-10-CM | POA: Diagnosis not present

## 2019-09-01 DIAGNOSIS — N184 Chronic kidney disease, stage 4 (severe): Secondary | ICD-10-CM | POA: Diagnosis not present

## 2019-09-01 DIAGNOSIS — N186 End stage renal disease: Secondary | ICD-10-CM | POA: Diagnosis not present

## 2019-09-01 DIAGNOSIS — N2581 Secondary hyperparathyroidism of renal origin: Secondary | ICD-10-CM | POA: Diagnosis not present

## 2019-09-01 DIAGNOSIS — Z992 Dependence on renal dialysis: Secondary | ICD-10-CM | POA: Diagnosis not present

## 2019-09-01 DIAGNOSIS — D631 Anemia in chronic kidney disease: Secondary | ICD-10-CM | POA: Diagnosis not present

## 2019-09-01 DIAGNOSIS — D509 Iron deficiency anemia, unspecified: Secondary | ICD-10-CM | POA: Diagnosis not present

## 2019-09-04 DIAGNOSIS — N186 End stage renal disease: Secondary | ICD-10-CM | POA: Diagnosis not present

## 2019-09-04 DIAGNOSIS — Z992 Dependence on renal dialysis: Secondary | ICD-10-CM | POA: Diagnosis not present

## 2019-09-04 DIAGNOSIS — N184 Chronic kidney disease, stage 4 (severe): Secondary | ICD-10-CM | POA: Diagnosis not present

## 2019-09-04 DIAGNOSIS — D509 Iron deficiency anemia, unspecified: Secondary | ICD-10-CM | POA: Diagnosis not present

## 2019-09-04 DIAGNOSIS — D631 Anemia in chronic kidney disease: Secondary | ICD-10-CM | POA: Diagnosis not present

## 2019-09-04 DIAGNOSIS — N2581 Secondary hyperparathyroidism of renal origin: Secondary | ICD-10-CM | POA: Diagnosis not present

## 2019-09-05 DIAGNOSIS — N2889 Other specified disorders of kidney and ureter: Secondary | ICD-10-CM | POA: Diagnosis not present

## 2019-09-05 DIAGNOSIS — Z992 Dependence on renal dialysis: Secondary | ICD-10-CM | POA: Diagnosis not present

## 2019-09-05 DIAGNOSIS — N186 End stage renal disease: Secondary | ICD-10-CM | POA: Diagnosis not present

## 2019-09-06 DIAGNOSIS — N184 Chronic kidney disease, stage 4 (severe): Secondary | ICD-10-CM | POA: Diagnosis not present

## 2019-09-06 DIAGNOSIS — N2581 Secondary hyperparathyroidism of renal origin: Secondary | ICD-10-CM | POA: Diagnosis not present

## 2019-09-06 DIAGNOSIS — Z992 Dependence on renal dialysis: Secondary | ICD-10-CM | POA: Diagnosis not present

## 2019-09-06 DIAGNOSIS — N186 End stage renal disease: Secondary | ICD-10-CM | POA: Diagnosis not present

## 2019-09-06 DIAGNOSIS — D631 Anemia in chronic kidney disease: Secondary | ICD-10-CM | POA: Diagnosis not present

## 2019-09-08 DIAGNOSIS — Z992 Dependence on renal dialysis: Secondary | ICD-10-CM | POA: Diagnosis not present

## 2019-09-08 DIAGNOSIS — D631 Anemia in chronic kidney disease: Secondary | ICD-10-CM | POA: Diagnosis not present

## 2019-09-08 DIAGNOSIS — N186 End stage renal disease: Secondary | ICD-10-CM | POA: Diagnosis not present

## 2019-09-08 DIAGNOSIS — N2581 Secondary hyperparathyroidism of renal origin: Secondary | ICD-10-CM | POA: Diagnosis not present

## 2019-09-08 DIAGNOSIS — N184 Chronic kidney disease, stage 4 (severe): Secondary | ICD-10-CM | POA: Diagnosis not present

## 2019-09-11 DIAGNOSIS — N186 End stage renal disease: Secondary | ICD-10-CM | POA: Diagnosis not present

## 2019-09-11 DIAGNOSIS — Z992 Dependence on renal dialysis: Secondary | ICD-10-CM | POA: Diagnosis not present

## 2019-09-11 DIAGNOSIS — N184 Chronic kidney disease, stage 4 (severe): Secondary | ICD-10-CM | POA: Diagnosis not present

## 2019-09-11 DIAGNOSIS — D631 Anemia in chronic kidney disease: Secondary | ICD-10-CM | POA: Diagnosis not present

## 2019-09-11 DIAGNOSIS — N2581 Secondary hyperparathyroidism of renal origin: Secondary | ICD-10-CM | POA: Diagnosis not present

## 2019-09-13 DIAGNOSIS — N186 End stage renal disease: Secondary | ICD-10-CM | POA: Diagnosis not present

## 2019-09-13 DIAGNOSIS — D631 Anemia in chronic kidney disease: Secondary | ICD-10-CM | POA: Diagnosis not present

## 2019-09-13 DIAGNOSIS — Z992 Dependence on renal dialysis: Secondary | ICD-10-CM | POA: Diagnosis not present

## 2019-09-13 DIAGNOSIS — N2581 Secondary hyperparathyroidism of renal origin: Secondary | ICD-10-CM | POA: Diagnosis not present

## 2019-09-13 DIAGNOSIS — N184 Chronic kidney disease, stage 4 (severe): Secondary | ICD-10-CM | POA: Diagnosis not present

## 2019-09-15 DIAGNOSIS — N2581 Secondary hyperparathyroidism of renal origin: Secondary | ICD-10-CM | POA: Diagnosis not present

## 2019-09-15 DIAGNOSIS — D631 Anemia in chronic kidney disease: Secondary | ICD-10-CM | POA: Diagnosis not present

## 2019-09-15 DIAGNOSIS — N186 End stage renal disease: Secondary | ICD-10-CM | POA: Diagnosis not present

## 2019-09-15 DIAGNOSIS — Z992 Dependence on renal dialysis: Secondary | ICD-10-CM | POA: Diagnosis not present

## 2019-09-15 DIAGNOSIS — N184 Chronic kidney disease, stage 4 (severe): Secondary | ICD-10-CM | POA: Diagnosis not present

## 2019-09-18 DIAGNOSIS — D631 Anemia in chronic kidney disease: Secondary | ICD-10-CM | POA: Diagnosis not present

## 2019-09-18 DIAGNOSIS — Z992 Dependence on renal dialysis: Secondary | ICD-10-CM | POA: Diagnosis not present

## 2019-09-18 DIAGNOSIS — N2581 Secondary hyperparathyroidism of renal origin: Secondary | ICD-10-CM | POA: Diagnosis not present

## 2019-09-18 DIAGNOSIS — N184 Chronic kidney disease, stage 4 (severe): Secondary | ICD-10-CM | POA: Diagnosis not present

## 2019-09-18 DIAGNOSIS — N186 End stage renal disease: Secondary | ICD-10-CM | POA: Diagnosis not present

## 2019-09-20 ENCOUNTER — Telehealth: Payer: Self-pay

## 2019-09-20 DIAGNOSIS — Z992 Dependence on renal dialysis: Secondary | ICD-10-CM | POA: Diagnosis not present

## 2019-09-20 DIAGNOSIS — D631 Anemia in chronic kidney disease: Secondary | ICD-10-CM | POA: Diagnosis not present

## 2019-09-20 DIAGNOSIS — N186 End stage renal disease: Secondary | ICD-10-CM | POA: Diagnosis not present

## 2019-09-20 DIAGNOSIS — N2581 Secondary hyperparathyroidism of renal origin: Secondary | ICD-10-CM | POA: Diagnosis not present

## 2019-09-20 DIAGNOSIS — N184 Chronic kidney disease, stage 4 (severe): Secondary | ICD-10-CM | POA: Diagnosis not present

## 2019-09-20 NOTE — Telephone Encounter (Signed)
Dialysis [name] and has been for several years, its starting wear him off as he is very fatigue afterwards. His gait is bent. He wants a power wheelchair. PT assessment order is being requested and  will be faxed. Verbal order would also be appreciated per Heather.   Nira Conn can be reached at [phone]  To Dr. Mariea Clonts and Eric [name]. Heather requested Eric [name] return her call.

## 2019-09-20 NOTE — Telephone Encounter (Signed)
I agree with the OT referral for power chair assessment for [facility].

## 2019-09-21 NOTE — Telephone Encounter (Signed)
Message given to [facility] she will have you sign the order once you return to [place],

## 2019-09-22 DIAGNOSIS — D631 Anemia in chronic kidney disease: Secondary | ICD-10-CM | POA: Diagnosis not present

## 2019-09-22 DIAGNOSIS — N2581 Secondary hyperparathyroidism of renal origin: Secondary | ICD-10-CM | POA: Diagnosis not present

## 2019-09-22 DIAGNOSIS — N184 Chronic kidney disease, stage 4 (severe): Secondary | ICD-10-CM | POA: Diagnosis not present

## 2019-09-22 DIAGNOSIS — N186 End stage renal disease: Secondary | ICD-10-CM | POA: Diagnosis not present

## 2019-09-22 DIAGNOSIS — Z992 Dependence on renal dialysis: Secondary | ICD-10-CM | POA: Diagnosis not present

## 2019-09-25 DIAGNOSIS — N186 End stage renal disease: Secondary | ICD-10-CM | POA: Diagnosis not present

## 2019-09-25 DIAGNOSIS — N184 Chronic kidney disease, stage 4 (severe): Secondary | ICD-10-CM | POA: Diagnosis not present

## 2019-09-25 DIAGNOSIS — D631 Anemia in chronic kidney disease: Secondary | ICD-10-CM | POA: Diagnosis not present

## 2019-09-25 DIAGNOSIS — N2581 Secondary hyperparathyroidism of renal origin: Secondary | ICD-10-CM | POA: Diagnosis not present

## 2019-09-25 DIAGNOSIS — Z992 Dependence on renal dialysis: Secondary | ICD-10-CM | POA: Diagnosis not present

## 2019-09-27 DIAGNOSIS — Z992 Dependence on renal dialysis: Secondary | ICD-10-CM | POA: Diagnosis not present

## 2019-09-27 DIAGNOSIS — N2581 Secondary hyperparathyroidism of renal origin: Secondary | ICD-10-CM | POA: Diagnosis not present

## 2019-09-27 DIAGNOSIS — D631 Anemia in chronic kidney disease: Secondary | ICD-10-CM | POA: Diagnosis not present

## 2019-09-27 DIAGNOSIS — N184 Chronic kidney disease, stage 4 (severe): Secondary | ICD-10-CM | POA: Diagnosis not present

## 2019-09-27 DIAGNOSIS — N186 End stage renal disease: Secondary | ICD-10-CM | POA: Diagnosis not present

## 2019-09-29 DIAGNOSIS — N2581 Secondary hyperparathyroidism of renal origin: Secondary | ICD-10-CM | POA: Diagnosis not present

## 2019-09-29 DIAGNOSIS — N186 End stage renal disease: Secondary | ICD-10-CM | POA: Diagnosis not present

## 2019-09-29 DIAGNOSIS — N184 Chronic kidney disease, stage 4 (severe): Secondary | ICD-10-CM | POA: Diagnosis not present

## 2019-09-29 DIAGNOSIS — Z992 Dependence on renal dialysis: Secondary | ICD-10-CM | POA: Diagnosis not present

## 2019-09-29 DIAGNOSIS — D631 Anemia in chronic kidney disease: Secondary | ICD-10-CM | POA: Diagnosis not present

## 2019-10-02 DIAGNOSIS — N184 Chronic kidney disease, stage 4 (severe): Secondary | ICD-10-CM | POA: Diagnosis not present

## 2019-10-02 DIAGNOSIS — N2581 Secondary hyperparathyroidism of renal origin: Secondary | ICD-10-CM | POA: Diagnosis not present

## 2019-10-02 DIAGNOSIS — Z992 Dependence on renal dialysis: Secondary | ICD-10-CM | POA: Diagnosis not present

## 2019-10-02 DIAGNOSIS — D631 Anemia in chronic kidney disease: Secondary | ICD-10-CM | POA: Diagnosis not present

## 2019-10-02 DIAGNOSIS — N186 End stage renal disease: Secondary | ICD-10-CM | POA: Diagnosis not present

## 2019-10-04 DIAGNOSIS — N186 End stage renal disease: Secondary | ICD-10-CM | POA: Diagnosis not present

## 2019-10-04 DIAGNOSIS — D631 Anemia in chronic kidney disease: Secondary | ICD-10-CM | POA: Diagnosis not present

## 2019-10-04 DIAGNOSIS — Z992 Dependence on renal dialysis: Secondary | ICD-10-CM | POA: Diagnosis not present

## 2019-10-04 DIAGNOSIS — N184 Chronic kidney disease, stage 4 (severe): Secondary | ICD-10-CM | POA: Diagnosis not present

## 2019-10-04 DIAGNOSIS — N2581 Secondary hyperparathyroidism of renal origin: Secondary | ICD-10-CM | POA: Diagnosis not present

## 2019-10-05 DIAGNOSIS — M6389 Disorders of muscle in diseases classified elsewhere, multiple sites: Secondary | ICD-10-CM | POA: Diagnosis not present

## 2019-10-05 DIAGNOSIS — Z992 Dependence on renal dialysis: Secondary | ICD-10-CM | POA: Diagnosis not present

## 2019-10-05 DIAGNOSIS — N2581 Secondary hyperparathyroidism of renal origin: Secondary | ICD-10-CM | POA: Diagnosis not present

## 2019-10-05 DIAGNOSIS — I5022 Chronic systolic (congestive) heart failure: Secondary | ICD-10-CM | POA: Diagnosis not present

## 2019-10-05 DIAGNOSIS — R2689 Other abnormalities of gait and mobility: Secondary | ICD-10-CM | POA: Diagnosis not present

## 2019-10-05 DIAGNOSIS — N184 Chronic kidney disease, stage 4 (severe): Secondary | ICD-10-CM | POA: Diagnosis not present

## 2019-10-05 DIAGNOSIS — Z9181 History of falling: Secondary | ICD-10-CM | POA: Diagnosis not present

## 2019-10-05 DIAGNOSIS — N2889 Other specified disorders of kidney and ureter: Secondary | ICD-10-CM | POA: Diagnosis not present

## 2019-10-05 DIAGNOSIS — R0602 Shortness of breath: Secondary | ICD-10-CM | POA: Diagnosis not present

## 2019-10-05 DIAGNOSIS — M5459 Other low back pain: Secondary | ICD-10-CM | POA: Diagnosis not present

## 2019-10-05 DIAGNOSIS — N186 End stage renal disease: Secondary | ICD-10-CM | POA: Diagnosis not present

## 2019-10-05 DIAGNOSIS — D631 Anemia in chronic kidney disease: Secondary | ICD-10-CM | POA: Diagnosis not present

## 2019-10-05 DIAGNOSIS — D509 Iron deficiency anemia, unspecified: Secondary | ICD-10-CM | POA: Diagnosis not present

## 2019-10-06 DIAGNOSIS — D631 Anemia in chronic kidney disease: Secondary | ICD-10-CM | POA: Diagnosis not present

## 2019-10-06 DIAGNOSIS — Z992 Dependence on renal dialysis: Secondary | ICD-10-CM | POA: Diagnosis not present

## 2019-10-06 DIAGNOSIS — N186 End stage renal disease: Secondary | ICD-10-CM | POA: Diagnosis not present

## 2019-10-06 DIAGNOSIS — D509 Iron deficiency anemia, unspecified: Secondary | ICD-10-CM | POA: Diagnosis not present

## 2019-10-06 DIAGNOSIS — N184 Chronic kidney disease, stage 4 (severe): Secondary | ICD-10-CM | POA: Diagnosis not present

## 2019-10-06 DIAGNOSIS — N2581 Secondary hyperparathyroidism of renal origin: Secondary | ICD-10-CM | POA: Diagnosis not present

## 2019-10-08 DIAGNOSIS — N186 End stage renal disease: Secondary | ICD-10-CM | POA: Diagnosis not present

## 2019-10-08 DIAGNOSIS — R2689 Other abnormalities of gait and mobility: Secondary | ICD-10-CM | POA: Diagnosis not present

## 2019-10-08 DIAGNOSIS — I5022 Chronic systolic (congestive) heart failure: Secondary | ICD-10-CM | POA: Diagnosis not present

## 2019-10-08 DIAGNOSIS — M5459 Other low back pain: Secondary | ICD-10-CM | POA: Diagnosis not present

## 2019-10-08 DIAGNOSIS — Z992 Dependence on renal dialysis: Secondary | ICD-10-CM | POA: Diagnosis not present

## 2019-10-08 DIAGNOSIS — M6389 Disorders of muscle in diseases classified elsewhere, multiple sites: Secondary | ICD-10-CM | POA: Diagnosis not present

## 2019-10-09 DIAGNOSIS — D631 Anemia in chronic kidney disease: Secondary | ICD-10-CM | POA: Diagnosis not present

## 2019-10-09 DIAGNOSIS — N2581 Secondary hyperparathyroidism of renal origin: Secondary | ICD-10-CM | POA: Diagnosis not present

## 2019-10-09 DIAGNOSIS — N186 End stage renal disease: Secondary | ICD-10-CM | POA: Diagnosis not present

## 2019-10-09 DIAGNOSIS — N184 Chronic kidney disease, stage 4 (severe): Secondary | ICD-10-CM | POA: Diagnosis not present

## 2019-10-09 DIAGNOSIS — Z992 Dependence on renal dialysis: Secondary | ICD-10-CM | POA: Diagnosis not present

## 2019-10-09 DIAGNOSIS — D509 Iron deficiency anemia, unspecified: Secondary | ICD-10-CM | POA: Diagnosis not present

## 2019-10-10 DIAGNOSIS — M6389 Disorders of muscle in diseases classified elsewhere, multiple sites: Secondary | ICD-10-CM | POA: Diagnosis not present

## 2019-10-10 DIAGNOSIS — M5459 Other low back pain: Secondary | ICD-10-CM | POA: Diagnosis not present

## 2019-10-10 DIAGNOSIS — I5022 Chronic systolic (congestive) heart failure: Secondary | ICD-10-CM | POA: Diagnosis not present

## 2019-10-10 DIAGNOSIS — R2689 Other abnormalities of gait and mobility: Secondary | ICD-10-CM | POA: Diagnosis not present

## 2019-10-10 DIAGNOSIS — Z992 Dependence on renal dialysis: Secondary | ICD-10-CM | POA: Diagnosis not present

## 2019-10-10 DIAGNOSIS — N186 End stage renal disease: Secondary | ICD-10-CM | POA: Diagnosis not present

## 2019-10-11 DIAGNOSIS — D631 Anemia in chronic kidney disease: Secondary | ICD-10-CM | POA: Diagnosis not present

## 2019-10-11 DIAGNOSIS — D509 Iron deficiency anemia, unspecified: Secondary | ICD-10-CM | POA: Diagnosis not present

## 2019-10-11 DIAGNOSIS — N184 Chronic kidney disease, stage 4 (severe): Secondary | ICD-10-CM | POA: Diagnosis not present

## 2019-10-11 DIAGNOSIS — N2581 Secondary hyperparathyroidism of renal origin: Secondary | ICD-10-CM | POA: Diagnosis not present

## 2019-10-11 DIAGNOSIS — N186 End stage renal disease: Secondary | ICD-10-CM | POA: Diagnosis not present

## 2019-10-11 DIAGNOSIS — Z992 Dependence on renal dialysis: Secondary | ICD-10-CM | POA: Diagnosis not present

## 2019-10-13 DIAGNOSIS — Z992 Dependence on renal dialysis: Secondary | ICD-10-CM | POA: Diagnosis not present

## 2019-10-13 DIAGNOSIS — N184 Chronic kidney disease, stage 4 (severe): Secondary | ICD-10-CM | POA: Diagnosis not present

## 2019-10-13 DIAGNOSIS — D631 Anemia in chronic kidney disease: Secondary | ICD-10-CM | POA: Diagnosis not present

## 2019-10-13 DIAGNOSIS — N186 End stage renal disease: Secondary | ICD-10-CM | POA: Diagnosis not present

## 2019-10-13 DIAGNOSIS — D509 Iron deficiency anemia, unspecified: Secondary | ICD-10-CM | POA: Diagnosis not present

## 2019-10-13 DIAGNOSIS — N2581 Secondary hyperparathyroidism of renal origin: Secondary | ICD-10-CM | POA: Diagnosis not present

## 2019-10-15 DIAGNOSIS — R2689 Other abnormalities of gait and mobility: Secondary | ICD-10-CM | POA: Diagnosis not present

## 2019-10-15 DIAGNOSIS — Z992 Dependence on renal dialysis: Secondary | ICD-10-CM | POA: Diagnosis not present

## 2019-10-15 DIAGNOSIS — M5459 Other low back pain: Secondary | ICD-10-CM | POA: Diagnosis not present

## 2019-10-15 DIAGNOSIS — M6389 Disorders of muscle in diseases classified elsewhere, multiple sites: Secondary | ICD-10-CM | POA: Diagnosis not present

## 2019-10-15 DIAGNOSIS — N186 End stage renal disease: Secondary | ICD-10-CM | POA: Diagnosis not present

## 2019-10-15 DIAGNOSIS — I5022 Chronic systolic (congestive) heart failure: Secondary | ICD-10-CM | POA: Diagnosis not present

## 2019-10-16 DIAGNOSIS — D509 Iron deficiency anemia, unspecified: Secondary | ICD-10-CM | POA: Diagnosis not present

## 2019-10-16 DIAGNOSIS — D631 Anemia in chronic kidney disease: Secondary | ICD-10-CM | POA: Diagnosis not present

## 2019-10-16 DIAGNOSIS — N184 Chronic kidney disease, stage 4 (severe): Secondary | ICD-10-CM | POA: Diagnosis not present

## 2019-10-16 DIAGNOSIS — Z992 Dependence on renal dialysis: Secondary | ICD-10-CM | POA: Diagnosis not present

## 2019-10-16 DIAGNOSIS — N186 End stage renal disease: Secondary | ICD-10-CM | POA: Diagnosis not present

## 2019-10-16 DIAGNOSIS — N2581 Secondary hyperparathyroidism of renal origin: Secondary | ICD-10-CM | POA: Diagnosis not present

## 2019-10-17 ENCOUNTER — Encounter: Payer: Self-pay | Admitting: Internal Medicine

## 2019-10-17 ENCOUNTER — Other Ambulatory Visit: Payer: Self-pay

## 2019-10-17 ENCOUNTER — Non-Acute Institutional Stay: Payer: Medicare Other | Admitting: Internal Medicine

## 2019-10-17 VITALS — BP 118/62 | HR 75 | Temp 98.1°F | Ht 70.0 in | Wt 161.6 lb

## 2019-10-17 DIAGNOSIS — J9611 Chronic respiratory failure with hypoxia: Secondary | ICD-10-CM

## 2019-10-17 DIAGNOSIS — I255 Ischemic cardiomyopathy: Secondary | ICD-10-CM

## 2019-10-17 DIAGNOSIS — I2510 Atherosclerotic heart disease of native coronary artery without angina pectoris: Secondary | ICD-10-CM

## 2019-10-17 DIAGNOSIS — G3184 Mild cognitive impairment, so stated: Secondary | ICD-10-CM

## 2019-10-17 DIAGNOSIS — N186 End stage renal disease: Secondary | ICD-10-CM | POA: Diagnosis not present

## 2019-10-17 DIAGNOSIS — N2581 Secondary hyperparathyroidism of renal origin: Secondary | ICD-10-CM | POA: Diagnosis not present

## 2019-10-17 DIAGNOSIS — M6389 Disorders of muscle in diseases classified elsewhere, multiple sites: Secondary | ICD-10-CM | POA: Diagnosis not present

## 2019-10-17 DIAGNOSIS — R2689 Other abnormalities of gait and mobility: Secondary | ICD-10-CM | POA: Diagnosis not present

## 2019-10-17 DIAGNOSIS — I5022 Chronic systolic (congestive) heart failure: Secondary | ICD-10-CM

## 2019-10-17 DIAGNOSIS — R5381 Other malaise: Secondary | ICD-10-CM

## 2019-10-17 DIAGNOSIS — M5459 Other low back pain: Secondary | ICD-10-CM | POA: Diagnosis not present

## 2019-10-17 DIAGNOSIS — Z992 Dependence on renal dialysis: Secondary | ICD-10-CM | POA: Diagnosis not present

## 2019-10-17 NOTE — Progress Notes (Signed)
Location:  Occupational psychologist of Service:  Clinic (12)  Provider: Dawsyn Zurn L. Mariea Clonts, D.O., C.M.D.  Code Status: DNR Goals of Care:  Advanced Directives 10/17/2019  Does Patient Have a Medical Advance Directive? Yes  Type of Advance Directive Out of facility DNR (pink MOST or yellow form)  Does patient want to make changes to medical advance directive? No - Patient declined  Copy of [place] in Chart? -  Would patient like information on creating a medical advance directive? -  Pre-existing out of facility DNR order (yellow form or pink MOST form) -   Chief Complaint  Patient presents with  . Medical Management of Chronic Issues    4 month follow up   . New Admit To SNF    Influenza ([place])    HPI: Patient is a 86 y.o. male seen today for medical management of chronic diseases.    He's now using a scooter.  He's not exhausted.  Once he needed a wheelchair to get him back to his apt after dinner.    His foot is recovered from using capsaicin cream on it.  His neck aches that same way after HD that his foot did before.   It's harder to walk back and forth like if Opal Sidles needs him in the other room b /c he's fatigued and dyspneic.  He uses oxygen concentrator on HD days.  They missed an appt for Jane's eye visit.  He was occupied with his appts and things he had to do.  His memory is declining.  He has so little time due to HD, then he misses things.  He reflects on how he used to eat lunch and run errands after HD.  Now he naps before lunch and does not leave home afterwards.  He does not even pick up the mail everyday anymore.  He feels ok on non-HD days like today.    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  They repaired it (plicated) and he's recovered.  He has a small bandaid on there.  Procedure was back on 8/25.  He forgets to take his sensipar in the evening.   He has  the meds sitting at the dining room table, but he goes to the [facility] dining room for dinner so forgets.  Suggested an alarm but he doesn't think anything will help and he does not always forget it.  They've stopped going to their daughter's b/c of the stone stairs to enter having no railing.  His wife had a fall there.    He even sleeps through HD now.    Past Medical History:  Diagnosis Date  . Anxiety   . Arthritis   . CHF (congestive heart failure) (HCC)    chronic systolic CHF  . Complication of anesthesia    " one time my heart stopped due to a medication that I was on."   . Coronary artery disease   . ESRD (end stage renal disease) on dialysis ([place])    Tues, Thursday, Saturday; [facility]; [place] (10/10/2017)  . GERD (gastroesophageal reflux disease)   . Gout   . Heart murmur    as a teen  . Herpes zoster 04/2012  . Hypertension    08/28/19- now has hypotension  . Left bundle branch block   . Pneumonia 10/2018  . Shortness of breath dyspnea  with exertion  "walking, probably due to my heart not eorking well"    Past Surgical History:  Procedure Laterality Date  . AORTIC VALVE REPLACEMENT N/A 12/09/2014   Procedure: AORTIC VALVE REPLACEMENT (AVR) WITH 23MM MAGNA EASE BIOPROSTHETIC VALVE;  Surgeon: Gaye Pollack, MD;  Location: [facility] OR;  Service: Open Heart Surgery;  Laterality: N/A;  . APPENDECTOMY  1944  . AV FISTULA PLACEMENT Left    Left [place] AVF placed in [place]   . AV FISTULA PLACEMENT Right 03/06/2014   Procedure: ARTERIOVENOUS (AV) FISTULA CREATION-right radiocephalic;  Surgeon: Mal Misty, MD;  Location: [facility] OR;  Service: Vascular;  Laterality: Right;  . AV FISTULA PLACEMENT Right 07/15/2014   Procedure: ARTERIOVENOUS (AV) FISTULA CREATION;  Surgeon: Elam Dutch, MD;  Location: [facility];  Service: Vascular;  Laterality: Right;  . CARDIAC CATHETERIZATION N/A 11/27/2014   Procedure: Right/Left Heart Cath and Coronary Angiography;  Surgeon:  Burnell Blanks, MD;  Location: [facility];  Service: Cardiovascular;  Laterality: N/A;  . CATARACT EXTRACTION W/ INTRAOCULAR LENS IMPLANT Bilateral 2014   [facility]  . COLONOSCOPY  2013   Dr. Annamaria Helling [place], [place].  . CORONARY ARTERY BYPASS GRAFT N/A 12/09/2014   Procedure: CORONARY ARTERY BYPASS GRAFTING (CABG), ON PUMP, TIMES THREE, USING LEFT INTERNAL MAMMARY ARTERY, RIGHT GREATER SAPHENOUS VEIN HARVESTED ENDOSCOPICALLY;  Surgeon: Gaye Pollack, MD;  Location: [facility];  Service: Open Heart Surgery;  Laterality: N/A;  LIMA-LAD; SVG-OM; SVG-PD  . DIALYSIS FISTULA CREATION  08/04/2012 and 10/13   Dr. Harden Mo  . FISTULA SUPERFICIALIZATION Right 08/29/2019   Procedure: RIGHT ARTERIOVENOUS FISTULA  PLICATION;  Surgeon: Serafina Mitchell, MD;  Location: [facility] OR;  Service: Vascular;  Laterality: Right;  . FISTULOGRAM Right 04/12/2019   Procedure: FISTULOGRAM with Balloon Venoplasty of Right Peripheral Vein;  Surgeon: Serafina Mitchell, MD;  Location: [facility];  Service: Vascular;  Laterality: Right;  . FISTULOGRAM Right 08/29/2019   Procedure: FISTULOGRAM;  Surgeon: Serafina Mitchell, MD;  Location: [facility] OR;  Service: Vascular;  Laterality: Right;  . INSERTION OF DIALYSIS CATHETER Right 01-15-14   Right chest TDC placed by Dr. Augustin Coupe at [facility]  . LIGATION OF ARTERIOVENOUS  FISTULA Right 07/15/2014   Procedure: LIGATION OF ARTERIOVENOUS  FISTULA  (RIGHT RADIOCEPHALIC);  Surgeon: Elam Dutch, MD;  Location: [facility];  Service: Vascular;  Laterality: Right;  . LIGATION OF COMPETING BRANCHES OF ARTERIOVENOUS FISTULA Right 05/08/2014   Procedure: LIGATION OF COMPETING BRANCHES OF RIGHT ARM RADIOCEPHALIC ARTERIOVENOUS FISTULA;  Surgeon: Mal Misty, MD;  Location: [facility];  Service: Vascular;  Laterality: Right;  . RESECTION OF ARTERIOVENOUS FISTULA ANEURYSM Right 08/29/2019   Procedure: RESECTION OF ARTERIOVENOUS FISTULA ANEURYSM;  Surgeon: Serafina Mitchell, MD;  Location: [facility];   Service: Vascular;  Laterality: Right;  . REVISON OF ARTERIOVENOUS FISTULA Right 07/15/2014   Procedure: EXPLORATION OF ARTERIOVENOUS FISTULA;  Surgeon: Elam Dutch, MD;  Location: [facility];  Service: Vascular;  Laterality: Right;  . REVISON OF ARTERIOVENOUS FISTULA Right 04/12/2019   Procedure: REVISON OF ARTERIOVENOUS FISTULA;  Surgeon: Serafina Mitchell, MD;  Location: [facility];  Service: Vascular;  Laterality: Right;  . TEE WITHOUT CARDIOVERSION N/A 12/09/2014   Procedure: TRANSESOPHAGEAL ECHOCARDIOGRAM (TEE);  Surgeon: Gaye Pollack, MD;  Location: [facility];  Service: Open Heart Surgery;  Laterality: N/A;  . VENOPLASTY Right 08/29/2019   Procedure: PERIPHERAL VENOPLASTY;  Surgeon: Serafina Mitchell, MD;  Location: [facility]  OR;  Service: Vascular;  Laterality: Right;    No Known Allergies  Outpatient Encounter Medications as of 10/17/2019  Medication Sig  . acetaminophen (TYLENOL) 500 MG tablet Take 1,000 mg by mouth 3 (three) times daily as needed for moderate pain.   Marland Kitchen allopurinol (ZYLOPRIM) 100 MG tablet TAKE 1 TABLET BY MOUTH DAILY  . atorvastatin (LIPITOR) 20 MG tablet TAKE 1 TABLET BY MOUTH DAILY FOR CHOLESTEROL  . calcium carbonate (TUMS EX) 750 MG chewable tablet Chew 1,500 mg by mouth 3 (three) times daily with meals.  . cinacalcet (SENSIPAR) 60 MG tablet Take 60 mg by mouth daily.   . midodrine (PROAMATINE) 10 MG tablet Take 10 mg by mouth 3 (three) times daily.  Marland Kitchen omeprazole (PRILOSEC) 20 MG capsule Take 20 mg by mouth daily.   . [DISCONTINUED] ethyl chloride spray Apply 1 application topically daily as needed (port access).   . [DISCONTINUED] oxyCODONE-acetaminophen (PERCOCET) 5-325 MG tablet Take 1 tablet by mouth every 4 (four) hours as needed for severe pain.   No facility-administered encounter medications on file as of 10/17/2019.    Review of Systems:  Review of Systems  Constitutional: Positive for malaise/fatigue and weight loss. Negative for chills and fever.       4 lbs since  last visit  HENT: Positive for hearing loss. Negative for congestion and sore throat.   Eyes: Negative for blurred vision.  Respiratory: Positive for shortness of breath. Negative for cough and wheezing.   Cardiovascular: Negative for chest pain, palpitations, orthopnea, claudication, leg swelling and PND.  Gastrointestinal: Negative for abdominal pain, blood in stool, constipation and melena.  Genitourinary: Negative for dysuria.  Musculoskeletal: Positive for back pain and neck pain. Negative for falls and joint pain.  Skin: Negative for itching and rash.  Neurological: Positive for sensory change. Negative for dizziness and loss of consciousness.  Psychiatric/Behavioral: Positive for memory loss. Negative for depression. The patient is not nervous/anxious and does not have insomnia.     Health Maintenance  Topic Date Due  . INFLUENZA VACCINE  08/05/2019  . TETANUS/TDAP  06/02/2027  . COVID-19 Vaccine  Completed  . PNA vac Low Risk Adult  Completed    Physical Exam: Vitals:   10/17/19 0925  BP: 118/62  Pulse: 75  Temp: 98.1 F (36.7 C)  SpO2: 97%  Weight: 161 lb 9.6 oz (73.3 kg)  Height: 5\' 10"  (1.778 m)   Body mass index is 23.19 kg/m. Physical Exam Vitals reviewed.  Constitutional:      General: He is not in acute distress.    Appearance: He is not toxic-appearing.     Comments: Moves slower, thinner  HENT:     Head: Normocephalic and atraumatic.  Eyes:     Extraocular Movements: Extraocular movements intact.     Conjunctiva/sclera: Conjunctivae normal.     Pupils: Pupils are equal, round, and reactive to light.  Cardiovascular:     Rate and Rhythm: Normal rate and regular rhythm.     Heart sounds: Murmur heard.   Pulmonary:     Effort: Pulmonary effort is normal.     Breath sounds: Normal breath sounds. No rales.  Abdominal:     General: Bowel sounds are normal.     Palpations: Abdomen is soft.     Tenderness: There is no abdominal tenderness.    Musculoskeletal:     Right lower leg: No edema.     Left lower leg: No edema.     Comments: Chronic stooped posture,  walks slowly with hands folded behind him   Skin:    General: Skin is warm and dry.  Neurological:     General: No focal deficit present.     Mental Status: He is alert and oriented to person, place, and time.     Gait: Gait abnormal.     Comments: Repeating some things now, see hpi about forgetting  Psychiatric:        Mood and Affect: Mood normal.     Labs reviewed: Basic Metabolic Panel: Recent Labs    11/02/18 0536 11/02/18 0536 11/02/18 0639 11/03/18 0427 11/03/18 0427 11/04/18 0609 04/12/19 0646 08/29/19 0953  NA 137   < >  --  138   < > 139 136 138  K 5.2*   < >  --  4.3   < > 4.2 5.0 4.3  CL 104   < >  --  100   < > 99 100 97*  CO2 21*  --   --  25  --  25  --   --   GLUCOSE 106*   < >  --  91   < > 109* 111* 110*  BUN 57*   < >  --  39*   < > 45* 56* 43*  CREATININE 8.28*   < >  --  5.87*   < > 5.42* 9.20* 7.20*  CALCIUM 9.0  --   --  8.8*  --  9.4  --   --   PHOS  --   --  4.0  --   --   --   --   --    < > = values in this interval not displayed.   Liver Function Tests: Recent Labs    10/31/18 2126 11/02/18 0536  AST 115* 40  ALT 21 16  ALKPHOS 63 46  BILITOT 1.2 0.6  PROT 7.7 6.0*  ALBUMIN 3.6 2.7*   Recent Labs    10/31/18 2126  LIPASE 34   No results for input(s): AMMONIA in the last 8760 hours. CBC: Recent Labs    10/31/18 2126 10/31/18 2126 11/02/18 0536 11/02/18 0536 11/03/18 0427 11/03/18 0427 11/04/18 0609 04/12/19 0646 08/29/19 0953  WBC 19.8*   < > 11.4*  --  8.5  --  8.3  --   --   NEUTROABS 17.8*  --   --   --   --   --   --   --   --   HGB 12.9*   < > 9.6*   < > 9.4*   < > 10.0* 10.9* 12.2*  HCT 38.5*   < > 29.0*   < > 28.3*   < > 30.9* 32.0* 36.0*  MCV 101.6*   < > 103.9*  --  102.2*  --  104.0*  --   --   PLT 169   < > 140*  --  134*  --  161  --   --    < > = values in this interval not displayed.    Lipid Panel: No results for input(s): CHOL, HDL, LDLCALC, TRIG, CHOLHDL, LDLDIRECT in the last 8760 hours. Lab Results  Component Value Date   HGBA1C 5.2 12/06/2014    Assessment/Plan 1. ESRD on dialysis [facility]) -continues on T/R/Sat am -wears him out more all the time -still looks after his wife so doubt he will stop this unless he has a serious complication  2. MCI (mild cognitive impairment) with memory loss -  forgetting evening med for himself, they've missed appts here and other places and he's no longer physically able to help his wife with things like he once was due to his dyspnea and malaise -discussed AL, caregivers, and additional med mgt (have for his wife), but he declined -family has recommended he not drive already and they are not going to family's house due to physical safety concerns -expressed my concerns, as well  3. Secondary hyperparathyroidism, renal ([place]) -monitored at HD -concerning that he misses his sensipar at time  4. Chronic respiratory failure with hypoxia (HCC) -cont O2 when needed pre-HD  5. Chronic systolic CHF (congestive heart failure) ([place]) -he's been having more difficulty with this, being winded, weaker and more frail -cont same regimen and monitor; fluid managed with HD  6. Coronary artery disease involving native coronary artery of native heart without angina pectoris -no active new symptoms, cont same regimen and monitor  7. Physical debility -is declining -PT/OT involved to get him going with power scooter so he can still get around [place]; however, I'm also getting concerned about his cognition and his wife has already had dementia for years -also would benefit from strengthening exercises for his proximal thighs though I doubt he has the pep to do them  Labs/tests ordered:  None--does regularly at HD--was bringing me these sometimes, but not recently  Next appt:  02/20/2020   Cimone Fahey L. Bradden Tadros, D.O. [facility] [address], [place] [address] Cell Phone (Mon-Fri 8am-5pm):  [phone] On Call:  [phone] & follow prompts after 5pm & weekends Office Phone:  [phone] Office Fax:  [phone]

## 2019-10-18 DIAGNOSIS — N2581 Secondary hyperparathyroidism of renal origin: Secondary | ICD-10-CM | POA: Diagnosis not present

## 2019-10-18 DIAGNOSIS — Z992 Dependence on renal dialysis: Secondary | ICD-10-CM | POA: Diagnosis not present

## 2019-10-18 DIAGNOSIS — N184 Chronic kidney disease, stage 4 (severe): Secondary | ICD-10-CM | POA: Diagnosis not present

## 2019-10-18 DIAGNOSIS — D631 Anemia in chronic kidney disease: Secondary | ICD-10-CM | POA: Diagnosis not present

## 2019-10-18 DIAGNOSIS — N186 End stage renal disease: Secondary | ICD-10-CM | POA: Diagnosis not present

## 2019-10-18 DIAGNOSIS — D509 Iron deficiency anemia, unspecified: Secondary | ICD-10-CM | POA: Diagnosis not present

## 2019-10-20 DIAGNOSIS — N2581 Secondary hyperparathyroidism of renal origin: Secondary | ICD-10-CM | POA: Diagnosis not present

## 2019-10-20 DIAGNOSIS — N186 End stage renal disease: Secondary | ICD-10-CM | POA: Diagnosis not present

## 2019-10-20 DIAGNOSIS — Z992 Dependence on renal dialysis: Secondary | ICD-10-CM | POA: Diagnosis not present

## 2019-10-20 DIAGNOSIS — D509 Iron deficiency anemia, unspecified: Secondary | ICD-10-CM | POA: Diagnosis not present

## 2019-10-20 DIAGNOSIS — N184 Chronic kidney disease, stage 4 (severe): Secondary | ICD-10-CM | POA: Diagnosis not present

## 2019-10-20 DIAGNOSIS — D631 Anemia in chronic kidney disease: Secondary | ICD-10-CM | POA: Diagnosis not present

## 2019-10-21 DIAGNOSIS — G3184 Mild cognitive impairment, so stated: Secondary | ICD-10-CM | POA: Insufficient documentation

## 2019-10-21 DIAGNOSIS — R5381 Other malaise: Secondary | ICD-10-CM | POA: Insufficient documentation

## 2019-10-23 DIAGNOSIS — D631 Anemia in chronic kidney disease: Secondary | ICD-10-CM | POA: Diagnosis not present

## 2019-10-23 DIAGNOSIS — N2581 Secondary hyperparathyroidism of renal origin: Secondary | ICD-10-CM | POA: Diagnosis not present

## 2019-10-23 DIAGNOSIS — Z992 Dependence on renal dialysis: Secondary | ICD-10-CM | POA: Diagnosis not present

## 2019-10-23 DIAGNOSIS — N184 Chronic kidney disease, stage 4 (severe): Secondary | ICD-10-CM | POA: Diagnosis not present

## 2019-10-23 DIAGNOSIS — D509 Iron deficiency anemia, unspecified: Secondary | ICD-10-CM | POA: Diagnosis not present

## 2019-10-23 DIAGNOSIS — N186 End stage renal disease: Secondary | ICD-10-CM | POA: Diagnosis not present

## 2019-10-25 DIAGNOSIS — D631 Anemia in chronic kidney disease: Secondary | ICD-10-CM | POA: Diagnosis not present

## 2019-10-25 DIAGNOSIS — Z992 Dependence on renal dialysis: Secondary | ICD-10-CM | POA: Diagnosis not present

## 2019-10-25 DIAGNOSIS — N184 Chronic kidney disease, stage 4 (severe): Secondary | ICD-10-CM | POA: Diagnosis not present

## 2019-10-25 DIAGNOSIS — N186 End stage renal disease: Secondary | ICD-10-CM | POA: Diagnosis not present

## 2019-10-25 DIAGNOSIS — D509 Iron deficiency anemia, unspecified: Secondary | ICD-10-CM | POA: Diagnosis not present

## 2019-10-25 DIAGNOSIS — N2581 Secondary hyperparathyroidism of renal origin: Secondary | ICD-10-CM | POA: Diagnosis not present

## 2019-10-27 DIAGNOSIS — D509 Iron deficiency anemia, unspecified: Secondary | ICD-10-CM | POA: Diagnosis not present

## 2019-10-27 DIAGNOSIS — N2581 Secondary hyperparathyroidism of renal origin: Secondary | ICD-10-CM | POA: Diagnosis not present

## 2019-10-27 DIAGNOSIS — N186 End stage renal disease: Secondary | ICD-10-CM | POA: Diagnosis not present

## 2019-10-27 DIAGNOSIS — D631 Anemia in chronic kidney disease: Secondary | ICD-10-CM | POA: Diagnosis not present

## 2019-10-27 DIAGNOSIS — N184 Chronic kidney disease, stage 4 (severe): Secondary | ICD-10-CM | POA: Diagnosis not present

## 2019-10-27 DIAGNOSIS — Z992 Dependence on renal dialysis: Secondary | ICD-10-CM | POA: Diagnosis not present

## 2019-10-30 DIAGNOSIS — D509 Iron deficiency anemia, unspecified: Secondary | ICD-10-CM | POA: Diagnosis not present

## 2019-10-30 DIAGNOSIS — N184 Chronic kidney disease, stage 4 (severe): Secondary | ICD-10-CM | POA: Diagnosis not present

## 2019-10-30 DIAGNOSIS — N186 End stage renal disease: Secondary | ICD-10-CM | POA: Diagnosis not present

## 2019-10-30 DIAGNOSIS — N2581 Secondary hyperparathyroidism of renal origin: Secondary | ICD-10-CM | POA: Diagnosis not present

## 2019-10-30 DIAGNOSIS — D631 Anemia in chronic kidney disease: Secondary | ICD-10-CM | POA: Diagnosis not present

## 2019-10-30 DIAGNOSIS — Z992 Dependence on renal dialysis: Secondary | ICD-10-CM | POA: Diagnosis not present

## 2019-10-31 DIAGNOSIS — Z992 Dependence on renal dialysis: Secondary | ICD-10-CM | POA: Diagnosis not present

## 2019-10-31 DIAGNOSIS — R2689 Other abnormalities of gait and mobility: Secondary | ICD-10-CM | POA: Diagnosis not present

## 2019-10-31 DIAGNOSIS — M6389 Disorders of muscle in diseases classified elsewhere, multiple sites: Secondary | ICD-10-CM | POA: Diagnosis not present

## 2019-10-31 DIAGNOSIS — N186 End stage renal disease: Secondary | ICD-10-CM | POA: Diagnosis not present

## 2019-10-31 DIAGNOSIS — M5459 Other low back pain: Secondary | ICD-10-CM | POA: Diagnosis not present

## 2019-10-31 DIAGNOSIS — I5022 Chronic systolic (congestive) heart failure: Secondary | ICD-10-CM | POA: Diagnosis not present

## 2019-11-01 DIAGNOSIS — D509 Iron deficiency anemia, unspecified: Secondary | ICD-10-CM | POA: Diagnosis not present

## 2019-11-01 DIAGNOSIS — N186 End stage renal disease: Secondary | ICD-10-CM | POA: Diagnosis not present

## 2019-11-01 DIAGNOSIS — D631 Anemia in chronic kidney disease: Secondary | ICD-10-CM | POA: Diagnosis not present

## 2019-11-01 DIAGNOSIS — N184 Chronic kidney disease, stage 4 (severe): Secondary | ICD-10-CM | POA: Diagnosis not present

## 2019-11-01 DIAGNOSIS — N2581 Secondary hyperparathyroidism of renal origin: Secondary | ICD-10-CM | POA: Diagnosis not present

## 2019-11-01 DIAGNOSIS — Z992 Dependence on renal dialysis: Secondary | ICD-10-CM | POA: Diagnosis not present

## 2019-11-03 DIAGNOSIS — N184 Chronic kidney disease, stage 4 (severe): Secondary | ICD-10-CM | POA: Diagnosis not present

## 2019-11-03 DIAGNOSIS — N2581 Secondary hyperparathyroidism of renal origin: Secondary | ICD-10-CM | POA: Diagnosis not present

## 2019-11-03 DIAGNOSIS — D509 Iron deficiency anemia, unspecified: Secondary | ICD-10-CM | POA: Diagnosis not present

## 2019-11-03 DIAGNOSIS — D631 Anemia in chronic kidney disease: Secondary | ICD-10-CM | POA: Diagnosis not present

## 2019-11-03 DIAGNOSIS — N186 End stage renal disease: Secondary | ICD-10-CM | POA: Diagnosis not present

## 2019-11-03 DIAGNOSIS — Z992 Dependence on renal dialysis: Secondary | ICD-10-CM | POA: Diagnosis not present

## 2019-11-05 DIAGNOSIS — N2889 Other specified disorders of kidney and ureter: Secondary | ICD-10-CM | POA: Diagnosis not present

## 2019-11-05 DIAGNOSIS — N186 End stage renal disease: Secondary | ICD-10-CM | POA: Diagnosis not present

## 2019-11-05 DIAGNOSIS — Z992 Dependence on renal dialysis: Secondary | ICD-10-CM | POA: Diagnosis not present

## 2019-11-06 DIAGNOSIS — N2581 Secondary hyperparathyroidism of renal origin: Secondary | ICD-10-CM | POA: Diagnosis not present

## 2019-11-06 DIAGNOSIS — Z992 Dependence on renal dialysis: Secondary | ICD-10-CM | POA: Diagnosis not present

## 2019-11-06 DIAGNOSIS — D631 Anemia in chronic kidney disease: Secondary | ICD-10-CM | POA: Diagnosis not present

## 2019-11-06 DIAGNOSIS — N184 Chronic kidney disease, stage 4 (severe): Secondary | ICD-10-CM | POA: Diagnosis not present

## 2019-11-06 DIAGNOSIS — D509 Iron deficiency anemia, unspecified: Secondary | ICD-10-CM | POA: Diagnosis not present

## 2019-11-06 DIAGNOSIS — N186 End stage renal disease: Secondary | ICD-10-CM | POA: Diagnosis not present

## 2019-11-08 DIAGNOSIS — N184 Chronic kidney disease, stage 4 (severe): Secondary | ICD-10-CM | POA: Diagnosis not present

## 2019-11-08 DIAGNOSIS — N2581 Secondary hyperparathyroidism of renal origin: Secondary | ICD-10-CM | POA: Diagnosis not present

## 2019-11-08 DIAGNOSIS — D631 Anemia in chronic kidney disease: Secondary | ICD-10-CM | POA: Diagnosis not present

## 2019-11-08 DIAGNOSIS — N186 End stage renal disease: Secondary | ICD-10-CM | POA: Diagnosis not present

## 2019-11-08 DIAGNOSIS — Z992 Dependence on renal dialysis: Secondary | ICD-10-CM | POA: Diagnosis not present

## 2019-11-08 DIAGNOSIS — D509 Iron deficiency anemia, unspecified: Secondary | ICD-10-CM | POA: Diagnosis not present

## 2019-11-10 DIAGNOSIS — D631 Anemia in chronic kidney disease: Secondary | ICD-10-CM | POA: Diagnosis not present

## 2019-11-10 DIAGNOSIS — N2581 Secondary hyperparathyroidism of renal origin: Secondary | ICD-10-CM | POA: Diagnosis not present

## 2019-11-10 DIAGNOSIS — D509 Iron deficiency anemia, unspecified: Secondary | ICD-10-CM | POA: Diagnosis not present

## 2019-11-10 DIAGNOSIS — N186 End stage renal disease: Secondary | ICD-10-CM | POA: Diagnosis not present

## 2019-11-10 DIAGNOSIS — N184 Chronic kidney disease, stage 4 (severe): Secondary | ICD-10-CM | POA: Diagnosis not present

## 2019-11-10 DIAGNOSIS — Z992 Dependence on renal dialysis: Secondary | ICD-10-CM | POA: Diagnosis not present

## 2019-11-13 DIAGNOSIS — D631 Anemia in chronic kidney disease: Secondary | ICD-10-CM | POA: Diagnosis not present

## 2019-11-13 DIAGNOSIS — D509 Iron deficiency anemia, unspecified: Secondary | ICD-10-CM | POA: Diagnosis not present

## 2019-11-13 DIAGNOSIS — N184 Chronic kidney disease, stage 4 (severe): Secondary | ICD-10-CM | POA: Diagnosis not present

## 2019-11-13 DIAGNOSIS — Z992 Dependence on renal dialysis: Secondary | ICD-10-CM | POA: Diagnosis not present

## 2019-11-13 DIAGNOSIS — N2581 Secondary hyperparathyroidism of renal origin: Secondary | ICD-10-CM | POA: Diagnosis not present

## 2019-11-13 DIAGNOSIS — N186 End stage renal disease: Secondary | ICD-10-CM | POA: Diagnosis not present

## 2019-11-15 DIAGNOSIS — N186 End stage renal disease: Secondary | ICD-10-CM | POA: Diagnosis not present

## 2019-11-15 DIAGNOSIS — Z992 Dependence on renal dialysis: Secondary | ICD-10-CM | POA: Diagnosis not present

## 2019-11-15 DIAGNOSIS — D631 Anemia in chronic kidney disease: Secondary | ICD-10-CM | POA: Diagnosis not present

## 2019-11-15 DIAGNOSIS — N184 Chronic kidney disease, stage 4 (severe): Secondary | ICD-10-CM | POA: Diagnosis not present

## 2019-11-15 DIAGNOSIS — D509 Iron deficiency anemia, unspecified: Secondary | ICD-10-CM | POA: Diagnosis not present

## 2019-11-15 DIAGNOSIS — N2581 Secondary hyperparathyroidism of renal origin: Secondary | ICD-10-CM | POA: Diagnosis not present

## 2019-11-17 DIAGNOSIS — D509 Iron deficiency anemia, unspecified: Secondary | ICD-10-CM | POA: Diagnosis not present

## 2019-11-17 DIAGNOSIS — N184 Chronic kidney disease, stage 4 (severe): Secondary | ICD-10-CM | POA: Diagnosis not present

## 2019-11-17 DIAGNOSIS — D631 Anemia in chronic kidney disease: Secondary | ICD-10-CM | POA: Diagnosis not present

## 2019-11-17 DIAGNOSIS — Z992 Dependence on renal dialysis: Secondary | ICD-10-CM | POA: Diagnosis not present

## 2019-11-17 DIAGNOSIS — N186 End stage renal disease: Secondary | ICD-10-CM | POA: Diagnosis not present

## 2019-11-17 DIAGNOSIS — N2581 Secondary hyperparathyroidism of renal origin: Secondary | ICD-10-CM | POA: Diagnosis not present

## 2019-11-20 DIAGNOSIS — Z992 Dependence on renal dialysis: Secondary | ICD-10-CM | POA: Diagnosis not present

## 2019-11-20 DIAGNOSIS — D631 Anemia in chronic kidney disease: Secondary | ICD-10-CM | POA: Diagnosis not present

## 2019-11-20 DIAGNOSIS — N184 Chronic kidney disease, stage 4 (severe): Secondary | ICD-10-CM | POA: Diagnosis not present

## 2019-11-20 DIAGNOSIS — D509 Iron deficiency anemia, unspecified: Secondary | ICD-10-CM | POA: Diagnosis not present

## 2019-11-20 DIAGNOSIS — N186 End stage renal disease: Secondary | ICD-10-CM | POA: Diagnosis not present

## 2019-11-20 DIAGNOSIS — N2581 Secondary hyperparathyroidism of renal origin: Secondary | ICD-10-CM | POA: Diagnosis not present

## 2019-11-22 DIAGNOSIS — N186 End stage renal disease: Secondary | ICD-10-CM | POA: Diagnosis not present

## 2019-11-22 DIAGNOSIS — D631 Anemia in chronic kidney disease: Secondary | ICD-10-CM | POA: Diagnosis not present

## 2019-11-22 DIAGNOSIS — N2581 Secondary hyperparathyroidism of renal origin: Secondary | ICD-10-CM | POA: Diagnosis not present

## 2019-11-22 DIAGNOSIS — D509 Iron deficiency anemia, unspecified: Secondary | ICD-10-CM | POA: Diagnosis not present

## 2019-11-22 DIAGNOSIS — N184 Chronic kidney disease, stage 4 (severe): Secondary | ICD-10-CM | POA: Diagnosis not present

## 2019-11-22 DIAGNOSIS — Z992 Dependence on renal dialysis: Secondary | ICD-10-CM | POA: Diagnosis not present

## 2019-11-24 DIAGNOSIS — N2581 Secondary hyperparathyroidism of renal origin: Secondary | ICD-10-CM | POA: Diagnosis not present

## 2019-11-24 DIAGNOSIS — N184 Chronic kidney disease, stage 4 (severe): Secondary | ICD-10-CM | POA: Diagnosis not present

## 2019-11-24 DIAGNOSIS — N186 End stage renal disease: Secondary | ICD-10-CM | POA: Diagnosis not present

## 2019-11-24 DIAGNOSIS — D631 Anemia in chronic kidney disease: Secondary | ICD-10-CM | POA: Diagnosis not present

## 2019-11-24 DIAGNOSIS — D509 Iron deficiency anemia, unspecified: Secondary | ICD-10-CM | POA: Diagnosis not present

## 2019-11-24 DIAGNOSIS — Z992 Dependence on renal dialysis: Secondary | ICD-10-CM | POA: Diagnosis not present

## 2019-11-26 DIAGNOSIS — N184 Chronic kidney disease, stage 4 (severe): Secondary | ICD-10-CM | POA: Diagnosis not present

## 2019-11-26 DIAGNOSIS — D631 Anemia in chronic kidney disease: Secondary | ICD-10-CM | POA: Diagnosis not present

## 2019-11-26 DIAGNOSIS — N186 End stage renal disease: Secondary | ICD-10-CM | POA: Diagnosis not present

## 2019-11-26 DIAGNOSIS — Z992 Dependence on renal dialysis: Secondary | ICD-10-CM | POA: Diagnosis not present

## 2019-11-26 DIAGNOSIS — D509 Iron deficiency anemia, unspecified: Secondary | ICD-10-CM | POA: Diagnosis not present

## 2019-11-26 DIAGNOSIS — N2581 Secondary hyperparathyroidism of renal origin: Secondary | ICD-10-CM | POA: Diagnosis not present

## 2019-11-28 DIAGNOSIS — N2581 Secondary hyperparathyroidism of renal origin: Secondary | ICD-10-CM | POA: Diagnosis not present

## 2019-11-28 DIAGNOSIS — D631 Anemia in chronic kidney disease: Secondary | ICD-10-CM | POA: Diagnosis not present

## 2019-11-28 DIAGNOSIS — N184 Chronic kidney disease, stage 4 (severe): Secondary | ICD-10-CM | POA: Diagnosis not present

## 2019-11-28 DIAGNOSIS — N186 End stage renal disease: Secondary | ICD-10-CM | POA: Diagnosis not present

## 2019-11-28 DIAGNOSIS — D509 Iron deficiency anemia, unspecified: Secondary | ICD-10-CM | POA: Diagnosis not present

## 2019-11-28 DIAGNOSIS — Z992 Dependence on renal dialysis: Secondary | ICD-10-CM | POA: Diagnosis not present

## 2019-11-30 ENCOUNTER — Emergency Department (HOSPITAL_COMMUNITY): Payer: Medicare Other

## 2019-11-30 ENCOUNTER — Encounter (HOSPITAL_COMMUNITY): Payer: Self-pay

## 2019-11-30 ENCOUNTER — Other Ambulatory Visit: Payer: Self-pay

## 2019-11-30 ENCOUNTER — Emergency Department (HOSPITAL_COMMUNITY)
Admission: EM | Admit: 2019-11-30 | Discharge: 2019-11-30 | Disposition: A | Discharge status: Home / Self Care | Payer: Medicare Other | Attending: Emergency Medicine | Admitting: Emergency Medicine

## 2019-11-30 DIAGNOSIS — I10 Essential (primary) hypertension: Secondary | ICD-10-CM | POA: Diagnosis not present

## 2019-11-30 DIAGNOSIS — I517 Cardiomegaly: Secondary | ICD-10-CM | POA: Diagnosis not present

## 2019-11-30 DIAGNOSIS — Z992 Dependence on renal dialysis: Secondary | ICD-10-CM | POA: Diagnosis not present

## 2019-11-30 DIAGNOSIS — I132 Hypertensive heart and chronic kidney disease with heart failure and with stage 5 chronic kidney disease, or end stage renal disease: Secondary | ICD-10-CM | POA: Diagnosis not present

## 2019-11-30 DIAGNOSIS — R0689 Other abnormalities of breathing: Secondary | ICD-10-CM | POA: Diagnosis not present

## 2019-11-30 DIAGNOSIS — J810 Acute pulmonary edema: Secondary | ICD-10-CM | POA: Diagnosis not present

## 2019-11-30 DIAGNOSIS — J811 Chronic pulmonary edema: Secondary | ICD-10-CM | POA: Diagnosis not present

## 2019-11-30 DIAGNOSIS — R0902 Hypoxemia: Secondary | ICD-10-CM | POA: Diagnosis not present

## 2019-11-30 DIAGNOSIS — Z951 Presence of aortocoronary bypass graft: Secondary | ICD-10-CM | POA: Diagnosis not present

## 2019-11-30 DIAGNOSIS — I509 Heart failure, unspecified: Secondary | ICD-10-CM | POA: Diagnosis not present

## 2019-11-30 DIAGNOSIS — I5022 Chronic systolic (congestive) heart failure: Secondary | ICD-10-CM | POA: Insufficient documentation

## 2019-11-30 DIAGNOSIS — J189 Pneumonia, unspecified organism: Secondary | ICD-10-CM | POA: Diagnosis not present

## 2019-11-30 DIAGNOSIS — R0602 Shortness of breath: Secondary | ICD-10-CM | POA: Diagnosis not present

## 2019-11-30 DIAGNOSIS — N186 End stage renal disease: Secondary | ICD-10-CM | POA: Insufficient documentation

## 2019-11-30 DIAGNOSIS — Z20822 Contact with and (suspected) exposure to covid-19: Secondary | ICD-10-CM | POA: Insufficient documentation

## 2019-11-30 DIAGNOSIS — R000 Tachycardia, unspecified: Secondary | ICD-10-CM | POA: Diagnosis not present

## 2019-11-30 DIAGNOSIS — J90 Pleural effusion, not elsewhere classified: Secondary | ICD-10-CM | POA: Diagnosis not present

## 2019-11-30 DIAGNOSIS — N250 Renal osteodystrophy: Secondary | ICD-10-CM | POA: Diagnosis not present

## 2019-11-30 DIAGNOSIS — R21 Rash and other nonspecific skin eruption: Secondary | ICD-10-CM | POA: Diagnosis not present

## 2019-11-30 DIAGNOSIS — I2510 Atherosclerotic heart disease of native coronary artery without angina pectoris: Secondary | ICD-10-CM | POA: Diagnosis not present

## 2019-11-30 DIAGNOSIS — D631 Anemia in chronic kidney disease: Secondary | ICD-10-CM | POA: Diagnosis not present

## 2019-11-30 LAB — RESP PANEL BY RT-PCR (FLU A&B, COVID) ARPGX2
Influenza A by PCR: NEGATIVE
Influenza B by PCR: NEGATIVE
SARS Coronavirus 2 by RT PCR: NEGATIVE

## 2019-11-30 LAB — CBC WITH DIFFERENTIAL/PLATELET
Abs Immature Granulocytes: 0.06 10*3/uL (ref 0.00–0.07)
Basophils Absolute: 0.1 10*3/uL (ref 0.0–0.1)
Basophils Relative: 1 %
Eosinophils Absolute: 0.6 10*3/uL — ABNORMAL HIGH (ref 0.0–0.5)
Eosinophils Relative: 6 %
HCT: 33.4 % — ABNORMAL LOW (ref 39.0–52.0)
Hemoglobin: 10.8 g/dL — ABNORMAL LOW (ref 13.0–17.0)
Immature Granulocytes: 1 %
Lymphocytes Relative: 7 %
Lymphs Abs: 0.8 10*3/uL (ref 0.7–4.0)
MCH: 35 pg — ABNORMAL HIGH (ref 26.0–34.0)
MCHC: 32.3 g/dL (ref 30.0–36.0)
MCV: 108.1 fL — ABNORMAL HIGH (ref 80.0–100.0)
Monocytes Absolute: 0.8 10*3/uL (ref 0.1–1.0)
Monocytes Relative: 7 %
Neutro Abs: 8.1 10*3/uL — ABNORMAL HIGH (ref 1.7–7.7)
Neutrophils Relative %: 78 %
Platelets: 226 10*3/uL (ref 150–400)
RBC: 3.09 MIL/uL — ABNORMAL LOW (ref 4.22–5.81)
RDW: 14.4 % (ref 11.5–15.5)
WBC: 10.4 10*3/uL (ref 4.0–10.5)
nRBC: 0 % (ref 0.0–0.2)

## 2019-11-30 LAB — HEPATITIS B SURFACE ANTIGEN: Hepatitis B Surface Ag: NONREACTIVE

## 2019-11-30 LAB — BASIC METABOLIC PANEL
Anion gap: 15 (ref 5–15)
BUN: 48 mg/dL — ABNORMAL HIGH (ref 8–23)
CO2: 27 mmol/L (ref 22–32)
Calcium: 8.8 mg/dL — ABNORMAL LOW (ref 8.9–10.3)
Chloride: 93 mmol/L — ABNORMAL LOW (ref 98–111)
Creatinine, Ser: 6.77 mg/dL — ABNORMAL HIGH (ref 0.61–1.24)
GFR, Estimated: 7 mL/min — ABNORMAL LOW (ref >60)
Glucose, Bld: 104 mg/dL — ABNORMAL HIGH (ref 70–99)
Potassium: 5.8 mmol/L — ABNORMAL HIGH (ref 3.5–5.1)
Sodium: 135 mmol/L (ref 135–145)

## 2019-11-30 MED ORDER — CHLORHEXIDINE GLUCONATE CLOTH 2 % EX PADS: 6.0000 | MEDICATED_PAD | Freq: Every day | CUTANEOUS | Status: DC

## 2019-11-30 MED ORDER — MIDODRINE HCL 5 MG PO TABS: 10.0000 mg | ORAL_TABLET | ORAL | Status: DC | PRN

## 2019-11-30 MED ORDER — MIDODRINE HCL 5 MG PO TABS
ORAL_TABLET | ORAL | Status: AC
  Administered 2019-11-30: 10 mg via ORAL
  Filled 2019-11-30: qty 2

## 2019-11-30 NOTE — ED Provider Notes (Addendum)
[facility] EMERGENCY DEPARTMENT Provider Note   CSN: [id number] Arrival date & time: 11/30/19  0321     History Chief Complaint  Patient presents with  . Shortness of Breath    Eric [name] is a 86 y.o. male.  The history is provided by the patient and medical records.  Shortness of Breath   86 year old male with history of anxiety, arthritis, CHF, coronary artery disease, end-stage on renal dialysis, GERD, hypertension, presenting to the ED with sudden onset shortness of breath.  States he felt a little short of breath earlier in the day today, worse upon laying in bed tonight.  Denies any significant chest pain but did break out in a cold sweat tonight and felt a little nauseated.  Symptoms better when sitting upright.  He does have home as needed oxygen that he uses, generally 2L but was 88% on this with EMS so was increased to 4L.  Dialysis schedule usually Tue, Thur, Sat but had to go Wed this week due to holiday, next scheduled treatment Saturday.  No fever, chills, significant cough.  Has been vaccinated for covid plus booster.  Past Medical History:  Diagnosis Date  . Anxiety   . Arthritis   . CHF (congestive heart failure) (HCC)    chronic systolic CHF  . Complication of anesthesia    " one time my heart stopped due to a medication that I was on."   . Coronary artery disease   . ESRD (end stage renal disease) on dialysis ([place])    Tues, Thursday, Saturday; [facility]; [facility] (10/10/2017)  . GERD (gastroesophageal reflux disease)   . Gout   . Heart murmur    as a teen  . Herpes zoster 04/2012  . Hypertension    08/28/19- now has hypotension  . Left bundle branch block   . Pneumonia 10/2018  . Shortness of breath dyspnea    with exertion  "walking, probably due to my heart not eorking well"    Patient Active Problem List   Diagnosis Date Noted  . Physical debility 10/21/2019  . MCI (mild cognitive impairment) with memory loss  10/21/2019  . Chronic respiratory failure with hypoxia ([place]) 06/13/2019  . Alcohol use 11/01/2018  . Chronic systolic CHF (congestive heart failure) ([place]) 10/31/2018  . HLD (hyperlipidemia) 10/31/2018  . CAD (coronary artery disease) 10/31/2018  . Systolic CHF with reduced left ventricular function, NYHA class 3 ([place]) 07/21/2018  . Nonischemic cardiomyopathy ([place]) 05/17/2018  . Hyponatremia 10/11/2017  . Hypotension 10/10/2017  . Hyperkalemia 10/10/2017  . S/P CABG (coronary artery bypass graft) 10/10/2017  . Secondary hyperparathyroidism, renal ([place]) 05/11/2017  . A-V fistula ([place]) 11/10/2016  . Normocytic anemia 04/16/2016  . S/P AVR 12/09/2014  . Coronary artery disease involving native coronary artery of native heart with unstable angina pectoris ([place])   . Insomnia, unspecified 09/24/2013  . Pain in neck 09/24/2013  . Gout 06/04/2013  . ESRD on dialysis ([place]) 06/04/2013  . Essential hypertension   . GERD (gastroesophageal reflux disease)   . Herpes zoster 06/04/2008    Past Surgical History:  Procedure Laterality Date  . AORTIC VALVE REPLACEMENT N/A 12/09/2014   Procedure: AORTIC VALVE REPLACEMENT (AVR) WITH 23MM MAGNA EASE BIOPROSTHETIC VALVE;  Surgeon: Gaye Pollack, MD;  Location: [facility] OR;  Service: Open Heart Surgery;  Laterality: N/A;  . APPENDECTOMY  1944  . AV FISTULA PLACEMENT Left    Left Cimino AVF placed in [place]   . AV  FISTULA PLACEMENT Right 03/06/2014   Procedure: ARTERIOVENOUS (AV) FISTULA CREATION-right radiocephalic;  Surgeon: Mal Misty, MD;  Location: [facility] OR;  Service: Vascular;  Laterality: Right;  . AV FISTULA PLACEMENT Right 07/15/2014   Procedure: ARTERIOVENOUS (AV) FISTULA CREATION;  Surgeon: Elam Dutch, MD;  Location: [facility];  Service: Vascular;  Laterality: Right;  . CARDIAC CATHETERIZATION N/A 11/27/2014   Procedure: Right/Left Heart Cath and Coronary Angiography;  Surgeon: Burnell Blanks, MD;  Location: [facility] CV LAB;   Service: Cardiovascular;  Laterality: N/A;  . CATARACT EXTRACTION W/ INTRAOCULAR LENS IMPLANT Bilateral 2014   [facility]  . COLONOSCOPY  2013   Dr. Annamaria Helling [place], [place].  . CORONARY ARTERY BYPASS GRAFT N/A 12/09/2014   Procedure: CORONARY ARTERY BYPASS GRAFTING (CABG), ON PUMP, TIMES THREE, USING LEFT INTERNAL MAMMARY ARTERY, RIGHT GREATER SAPHENOUS VEIN HARVESTED ENDOSCOPICALLY;  Surgeon: Gaye Pollack, MD;  Location: [facility];  Service: Open Heart Surgery;  Laterality: N/A;  LIMA-LAD; SVG-OM; SVG-PD  . DIALYSIS FISTULA CREATION  08/04/2012 and 10/13   Dr. Harden Mo  . FISTULA SUPERFICIALIZATION Right 08/29/2019   Procedure: RIGHT ARTERIOVENOUS FISTULA  PLICATION;  Surgeon: Serafina Mitchell, MD;  Location: [facility] OR;  Service: Vascular;  Laterality: Right;  . FISTULOGRAM Right 04/12/2019   Procedure: FISTULOGRAM with Balloon Venoplasty of Right Peripheral Vein;  Surgeon: Serafina Mitchell, MD;  Location: [facility];  Service: Vascular;  Laterality: Right;  . FISTULOGRAM Right 08/29/2019   Procedure: FISTULOGRAM;  Surgeon: Serafina Mitchell, MD;  Location: [facility] OR;  Service: Vascular;  Laterality: Right;  . INSERTION OF DIALYSIS CATHETER Right 01-15-14   Right chest TDC placed by Dr. Augustin Coupe at [facility]  . LIGATION OF ARTERIOVENOUS  FISTULA Right 07/15/2014   Procedure: LIGATION OF ARTERIOVENOUS  FISTULA  (RIGHT RADIOCEPHALIC);  Surgeon: Elam Dutch, MD;  Location: [facility];  Service: Vascular;  Laterality: Right;  . LIGATION OF COMPETING BRANCHES OF ARTERIOVENOUS FISTULA Right 05/08/2014   Procedure: LIGATION OF COMPETING BRANCHES OF RIGHT ARM RADIOCEPHALIC ARTERIOVENOUS FISTULA;  Surgeon: Mal Misty, MD;  Location: [facility];  Service: Vascular;  Laterality: Right;  . RESECTION OF ARTERIOVENOUS FISTULA ANEURYSM Right 08/29/2019   Procedure: RESECTION OF ARTERIOVENOUS FISTULA ANEURYSM;  Surgeon: Serafina Mitchell, MD;  Location: [facility];  Service: Vascular;  Laterality: Right;  . REVISON OF ARTERIOVENOUS  FISTULA Right 07/15/2014   Procedure: EXPLORATION OF ARTERIOVENOUS FISTULA;  Surgeon: Elam Dutch, MD;  Location: [facility];  Service: Vascular;  Laterality: Right;  . REVISON OF ARTERIOVENOUS FISTULA Right 04/12/2019   Procedure: REVISON OF ARTERIOVENOUS FISTULA;  Surgeon: Serafina Mitchell, MD;  Location: [facility];  Service: Vascular;  Laterality: Right;  . TEE WITHOUT CARDIOVERSION N/A 12/09/2014   Procedure: TRANSESOPHAGEAL ECHOCARDIOGRAM (TEE);  Surgeon: Gaye Pollack, MD;  Location: [facility];  Service: Open Heart Surgery;  Laterality: N/A;  . VENOPLASTY Right 08/29/2019   Procedure: PERIPHERAL VENOPLASTY;  Surgeon: Serafina Mitchell, MD;  Location: [facility] OR;  Service: Vascular;  Laterality: Right;       Family History  Problem Relation Age of Onset  . Diabetes Father   . Lung cancer Father 15       non-smoker  . Cancer Mother        multiple myeloma  . Colon cancer Neg Hx     Social History   Tobacco Use  . Smoking status: Never Smoker  . Smokeless tobacco: Never Used  Vaping Use  .  Vaping Use: Never used  Substance Use Topics  . Alcohol use: Yes    Alcohol/week: 10.0 standard drinks    Types: 7 Standard drinks or equivalent, 3 Glasses of wine per week  . Drug use: No    Home Medications Prior to Admission medications   Medication Sig Start Date End Date Taking? Authorizing Provider  acetaminophen (TYLENOL) 500 MG tablet Take 1,000 mg by mouth 3 (three) times daily as needed for moderate pain.     [provider]  allopurinol (ZYLOPRIM) 100 MG tablet TAKE 1 TABLET BY MOUTH DAILY 07/12/19   Reed, Tiffany L, DO  atorvastatin (LIPITOR) 20 MG tablet TAKE 1 TABLET BY MOUTH DAILY FOR CHOLESTEROL 07/12/19   Reed, Tiffany L, DO  calcium carbonate (TUMS EX) 750 MG chewable tablet Chew 1,500 mg by mouth 3 (three) times daily with meals.    [provider]  cinacalcet (SENSIPAR) 60 MG tablet Take 60 mg by mouth daily.     [provider]  midodrine (PROAMATINE) 10 MG  tablet Take 10 mg by mouth 3 (three) times daily. 11/06/18   [provider]  omeprazole (PRILOSEC) 20 MG capsule Take 20 mg by mouth daily.     [provider]    Allergies    Patient has no known allergies.  Review of Systems   Review of Systems  Respiratory: Positive for shortness of breath.   All other systems reviewed and are negative.   Physical Exam Updated Vital Signs Pulse (!) 107   Temp 97.8 F (36.6 C) (Oral)   Resp (!) 33   Ht 5' 10"  (1.778 m)   Wt 74.8 kg   SpO2 98%   BMI 23.68 kg/m   Physical Exam Vitals and nursing note reviewed.  Constitutional:      Appearance: He is well-developed.  HENT:     Head: Normocephalic and atraumatic.  Eyes:     Conjunctiva/sclera: Conjunctivae normal.     Pupils: Pupils are equal, round, and reactive to light.  Cardiovascular:     Rate and Rhythm: Normal rate and regular rhythm.     Heart sounds: Normal heart sounds.  Pulmonary:     Effort: Pulmonary effort is normal.     Breath sounds: Rales present. No wheezing or rhonchi.  Abdominal:     General: Bowel sounds are normal.     Palpations: Abdomen is soft.  Musculoskeletal:        General: Normal range of motion.     Cervical back: Normal range of motion.  Skin:    General: Skin is warm and dry.  Neurological:     Mental Status: He is alert and oriented to person, place, and time.     ED Results / Procedures / Treatments   Labs (all labs ordered are listed, but only abnormal results are displayed) Labs Reviewed  CBC WITH DIFFERENTIAL/PLATELET - Abnormal; Notable for the following components:      Result Value   RBC 3.09 (*)    Hemoglobin 10.8 (*)    HCT 33.4 (*)    MCV 108.1 (*)    MCH 35.0 (*)    Neutro Abs 8.1 (*)    Eosinophils Absolute 0.6 (*)    All other components within normal limits  RESP PANEL BY RT-PCR (FLU A&B, COVID) ARPGX2  BASIC METABOLIC PANEL  I-STAT CHEM 8, ED    EKG EKG Interpretation  Date/Time:  Friday  November 30 2019 03:33:14 EST Ventricular Rate:  100 PR Interval:  QRS Duration: 181 QT Interval:  524 QTC Calculation: 676 R Axis:   -165 Text Interpretation: Ectopic atrial tachycardia, unifocal Nonspecific intraventricular conduction delay Confirmed by Orpah Greek [phone]) on 11/30/2019 5:02:09 AM   Radiology DG Chest Port 1 View  Result Date: 11/30/2019 CLINICAL DATA:  Shortness of breath. Increased oxygen requirement. End-stage renal disease on hemodialysis. EXAM: PORTABLE CHEST 1 VIEW COMPARISON:  11/02/2018 FINDINGS: Postoperative changes in the mediastinum. Cardiac enlargement. Developing interstitial disease throughout the lungs likely indicating edema. Pneumonia would be a secondary consideration. No pleural effusions. No pneumothorax. Mediastinal contours appear intact. Calcification of the aorta. IMPRESSION: Cardiac enlargement with developing interstitial edema. Electronically Signed   By: Lucienne Capers M.D.   On: 11/30/2019 04:58    Procedures Procedures (including critical care time)  Medications Ordered in ED Medications - No data to display  ED Course  I have reviewed the triage vital signs and the nursing notes.  Pertinent labs & imaging results that were available during my care of the patient were reviewed by me and considered in my medical decision making (see chart for details).    MDM Rules/Calculators/A&P  86 y.o. M here with SOB.  States he has felt a little "short winded" all day but acutely worse this evening while lying in bed.  Symptoms better when sitting upright.  Has home O2 that he uses as needed, usually 2 L but was 88% on this upon EMS arrival.  Currently on 4 L and saturating well but does get winded with any kind of movement or fluid conversation.  Does have some rales on exam.  Suspect fluid overload from altered dialysis schedule this week.  Will obtain screening labs, EKG, CXR.  5:36 AM Delay in chemistry due to clotting, repeat  specimen has been sent.  CXR with pulmonary edema.  Likely will need dialysis this morning given increased oxygen requirement.  covid screen is negative.  5:47 AM Discussed with on call nephrology, Dr. Candiss Norse-- will arrange for dialysis this AM and if doing well can be discharged afterwards.  Patient and daughter updated, they are in agreement with care plan.  Final Clinical Impression(s) / ED Diagnoses Final diagnoses:  Acute pulmonary edema [facility])    Rx / DC Orders ED Discharge Orders    None       Larene Pickett, PA-C 11/30/19 0557    Larene Pickett, PA-C 11/30/19 2233    Orpah Greek, MD 11/30/19 [phone]

## 2019-11-30 NOTE — ED Notes (Addendum)
Hemo pulled 2L off.  BP 90/53 and dialysis is going to give pt 200 ml.  Pt still in hemo.

## 2019-11-30 NOTE — Progress Notes (Signed)
Seen post dialysis. 2L net UF.  Feeling "much better" Lungs clearer. Post HD CXR has been ordered; if no infectious infiltrate unmasked can d/c.  Advised to go to regularly scheduled dialysis tomorrow (Saturday). Use new post-weight as EDW, pre-weight was 73.9 and challenged 2L successfully.  Madelon Lips MD [facility] pgr [phone]

## 2019-11-30 NOTE — ED Notes (Signed)
Pharmacy tech at bedside 

## 2019-11-30 NOTE — Consult Note (Addendum)
Reason for Consult: To manage dialysis and dialysis related needs  Referring Physician: Dr. Dolores Patty Bramel is an 86 y.o. male.   HPI: Pt is an 55M with a PMH sig for HTN, HLD, ESRD on HD, CAD s/p CABG and AVR, and CHF with EF 20-25% who is now seen in consultation at the request of Dr. Betsey Holiday for management of ESRD and provision of dialysis.  Presented earlier this AM with gradually increasing SOB over the past week or two.  Has been to all his dialysis sessions and hasn't missed any.  Got to the point where last night he couldn't sleep--> tried to lay down and slept for a little bit but then had the acute onset of SOB and some sweats.  He wears prn O2 at home (3L).  Called EMS who brought him in.    On 4L [place] O2 currently.  Is adherent to all dialysis.  Last session was 11/24, normally on TTS schedule but went on Wednesday for holiday schedule.    In ED,  EKG with IVCD but no acute STEMI.  RFP with K 5.8, BUN 48, Cr 6.77 CO2 27 Hgb 10.8 WBC ct 10.4 plts 226.  CXR with pulm edema, COVID negative.   Dialyzes at [facility] TTS 4 hrs BFR 400/DFR 800 via AVF EDW 73 kg, 2K/ 2Ca  Linear Na UF profile #2 no heparin Mircera 30 q 4 weeks, last given 11/4 Venofer 100 q rx x 10 Hectorol 1 q rx  Past Medical History:  Diagnosis Date  . Anxiety   . Arthritis   . CHF (congestive heart failure) (HCC)    chronic systolic CHF  . Complication of anesthesia    " one time my heart stopped due to a medication that I was on."   . Coronary artery disease   . ESRD (end stage renal disease) on dialysis ([place])    Tues, Thursday, Saturday; [facility]; [facility] (10/10/2017)  . GERD (gastroesophageal reflux disease)   . Gout   . Heart murmur    as a teen  . Herpes zoster 04/2012  . Hypertension    08/28/19- now has hypotension  . Left bundle branch block   . Pneumonia 10/2018  . Shortness of breath dyspnea    with exertion  "walking, probably due to my heart not eorking well"    Past Surgical  History:  Procedure Laterality Date  . AORTIC VALVE REPLACEMENT N/A 12/09/2014   Procedure: AORTIC VALVE REPLACEMENT (AVR) WITH 23MM MAGNA EASE BIOPROSTHETIC VALVE;  Surgeon: Gaye Pollack, MD;  Location: [facility] OR;  Service: Open Heart Surgery;  Laterality: N/A;  . APPENDECTOMY  1944  . AV FISTULA PLACEMENT Left    Left [place] AVF placed in [place]   . AV FISTULA PLACEMENT Right 03/06/2014   Procedure: ARTERIOVENOUS (AV) FISTULA CREATION-right radiocephalic;  Surgeon: Mal Misty, MD;  [facility];  Service: Vascular;  Laterality: Right;  . AV FISTULA PLACEMENT Right 07/15/2014   Procedure: ARTERIOVENOUS (AV) FISTULA CREATION;  Surgeon: Elam Dutch, MD;  Location: [facility];  Service: Vascular;  Laterality: Right;  . CARDIAC CATHETERIZATION N/A 11/27/2014   Procedure: Right/Left Heart Cath and Coronary Angiography;  Surgeon: Burnell Blanks, MD;  Location: [facility];  Service: Cardiovascular;  Laterality: N/A;  . CATARACT EXTRACTION W/ INTRAOCULAR LENS IMPLANT Bilateral 2014   [facility]  . COLONOSCOPY  2013   Dr. Annamaria Helling [place], [place].  . CORONARY ARTERY  BYPASS GRAFT N/A 12/09/2014   Procedure: CORONARY ARTERY BYPASS GRAFTING (CABG), ON PUMP, TIMES THREE, USING LEFT INTERNAL MAMMARY ARTERY, RIGHT GREATER SAPHENOUS VEIN HARVESTED ENDOSCOPICALLY;  Surgeon: Gaye Pollack, MD;  Location: [facility];  Service: Open Heart Surgery;  Laterality: N/A;  LIMA-LAD; SVG-OM; SVG-PD  . DIALYSIS FISTULA CREATION  08/04/2012 and 10/13   Dr. Harden Mo  . FISTULA SUPERFICIALIZATION Right 08/29/2019   Procedure: RIGHT ARTERIOVENOUS FISTULA  PLICATION;  Surgeon: Serafina Mitchell, MD;  Location: [facility];  Service: Vascular;  Laterality: Right;  . FISTULOGRAM Right 04/12/2019   Procedure: FISTULOGRAM with Balloon Venoplasty of Right Peripheral Vein;  Surgeon: Serafina Mitchell, MD;  Location: [facility];  Service: Vascular;  Laterality: Right;  . FISTULOGRAM Right 08/29/2019   Procedure:  FISTULOGRAM;  Surgeon: Serafina Mitchell, MD;  Location: [facility] OR;  Service: Vascular;  Laterality: Right;  . INSERTION OF DIALYSIS CATHETER Right 01-15-14   Right chest TDC placed by Dr. Augustin Coupe at [facility]  . LIGATION OF ARTERIOVENOUS  FISTULA Right 07/15/2014   Procedure: LIGATION OF ARTERIOVENOUS  FISTULA  (RIGHT RADIOCEPHALIC);  Surgeon: Elam Dutch, MD;  Location: [facility];  Service: Vascular;  Laterality: Right;  . LIGATION OF COMPETING BRANCHES OF ARTERIOVENOUS FISTULA Right 05/08/2014   Procedure: LIGATION OF COMPETING BRANCHES OF RIGHT ARM RADIOCEPHALIC ARTERIOVENOUS FISTULA;  Surgeon: Mal Misty, MD;  Location: [facility];  Service: Vascular;  Laterality: Right;  . RESECTION OF ARTERIOVENOUS FISTULA ANEURYSM Right 08/29/2019   Procedure: RESECTION OF ARTERIOVENOUS FISTULA ANEURYSM;  Surgeon: Serafina Mitchell, MD;  Location: [facility];  Service: Vascular;  Laterality: Right;  . REVISON OF ARTERIOVENOUS FISTULA Right 07/15/2014   Procedure: EXPLORATION OF ARTERIOVENOUS FISTULA;  Surgeon: Elam Dutch, MD;  Location: [facility];  Service: Vascular;  Laterality: Right;  . REVISON OF ARTERIOVENOUS FISTULA Right 04/12/2019   Procedure: REVISON OF ARTERIOVENOUS FISTULA;  Surgeon: Serafina Mitchell, MD;  Location: [facility];  Service: Vascular;  Laterality: Right;  . TEE WITHOUT CARDIOVERSION N/A 12/09/2014   Procedure: TRANSESOPHAGEAL ECHOCARDIOGRAM (TEE);  Surgeon: Gaye Pollack, MD;  Location: [facility];  Service: Open Heart Surgery;  Laterality: N/A;  . VENOPLASTY Right 08/29/2019   Procedure: PERIPHERAL VENOPLASTY;  Surgeon: Serafina Mitchell, MD;  Location: [facility] OR;  Service: Vascular;  Laterality: Right;    Family History  Problem Relation Age of Onset  . Diabetes Father   . Lung cancer Father 22       non-smoker  . Cancer Mother        multiple myeloma  . Colon cancer Neg Hx     Social History:  reports that he has never smoked. He has never used smokeless tobacco. He reports current alcohol use of about  10.0 standard drinks of alcohol per week. He reports that he does not use drugs.  Allergies: No Known Allergies  Medications: Prior to Admission: (Not in a hospital admission)    Results for orders placed or performed during the hospital encounter of 11/30/19 (from the past 48 hour(s))  Resp Panel by RT-PCR (Flu A&B, Covid) Nasopharyngeal Swab     Status: None   Collection Time: 11/30/19  3:37 AM   Specimen: Nasopharyngeal Swab; Nasopharyngeal(NP) swabs in vial transport medium  Result Value Ref Range   SARS Coronavirus 2 by RT PCR NEGATIVE NEGATIVE    Comment: (NOTE) SARS-CoV-2 target nucleic acids are NOT DETECTED.  The SARS-CoV-2 RNA is generally detectable in upper respiratory specimens during the acute  phase of infection. The lowest concentration of SARS-CoV-2 viral copies this assay can detect is 138 copies/mL. A negative result does not preclude SARS-Cov-2 infection and should not be used as the sole basis for treatment or other patient management decisions. A negative result may occur with  improper specimen collection/handling, submission of specimen other than nasopharyngeal swab, presence of viral mutation(s) within the areas targeted by this assay, and inadequate number of viral copies(<138 copies/mL). A negative result must be combined with clinical observations, patient history, and epidemiological information. The expected result is Negative.  Fact Sheet for Patients:  EntrepreneurPulse.com.au  Fact Sheet for Healthcare Providers:  IncredibleEmployment.be  This test is no t yet approved or cleared by the [place] FDA and  has been authorized for detection and/or diagnosis of SARS-CoV-2 by FDA under an Emergency Use Authorization (EUA). This EUA will remain  in effect (meaning this test can be used) for the duration of the COVID-19 declaration under Section 564(b)(1) of the Act, 21 U.S.C.section 360bbb-3(b)(1), unless the  authorization is terminated  or revoked sooner.       Influenza A by PCR NEGATIVE NEGATIVE   Influenza B by PCR NEGATIVE NEGATIVE    Comment: (NOTE) The Xpert Xpress SARS-CoV-2/FLU/RSV plus assay is intended as an aid in the diagnosis of influenza from Nasopharyngeal swab specimens and should not be used as a sole basis for treatment. Nasal washings and aspirates are unacceptable for Xpert Xpress SARS-CoV-2/FLU/RSV testing.  Fact Sheet for Patients: EntrepreneurPulse.com.au  Fact Sheet for Healthcare Providers: IncredibleEmployment.be  This test is not yet approved or cleared by the [place] FDA and has been authorized for detection and/or diagnosis of SARS-CoV-2 by FDA under an Emergency Use Authorization (EUA). This EUA will remain in effect (meaning this test can be used) for the duration of the COVID-19 declaration under Section 564(b)(1) of the Act, 21 U.S.C. section 360bbb-3(b)(1), unless the authorization is terminated or revoked.  Performed at [facility], [place] [address], [place], [place] [address]   CBC with Differential     Status: Abnormal   Collection Time: 11/30/19  3:39 AM  Result Value Ref Range   WBC 10.4 4.0 - 10.5 K/uL   RBC 3.09 (L) 4.22 - 5.81 MIL/uL   Hemoglobin 10.8 (L) 13.0 - 17.0 g/dL   HCT 33.4 (L) 39 - 52 %   MCV 108.1 (H) 80.0 - 100.0 fL   MCH 35.0 (H) 26.0 - 34.0 pg   MCHC 32.3 30.0 - 36.0 g/dL   RDW 14.4 11.5 - 15.5 %   Platelets 226 150 - 400 K/uL   nRBC 0.0 0.0 - 0.2 %   Neutrophils Relative % 78 %   Neutro Abs 8.1 (H) 1.7 - 7.7 K/uL   Lymphocytes Relative 7 %   Lymphs Abs 0.8 0.7 - 4.0 K/uL   Monocytes Relative 7 %   Monocytes Absolute 0.8 0.1 - 1.0 K/uL   Eosinophils Relative 6 %   Eosinophils Absolute 0.6 (H) 0.0 - 0.5 K/uL   Basophils Relative 1 %   Basophils Absolute 0.1 0.0 - 0.1 K/uL   Immature Granulocytes 1 %   Abs Immature Granulocytes 0.06 0.00 - 0.07 K/uL    Comment:  Performed at [facility], [place] [address] [place], [place] [address]  Basic metabolic panel     Status: Abnormal   Collection Time: 11/30/19  5:55 AM  Result Value Ref Range   Sodium 135 135 - 145 mmol/L   Potassium  5.8 (H) 3.5 - 5.1 mmol/L    Comment: HEMOLYSIS AT THIS LEVEL MAY AFFECT RESULT   Chloride 93 (L) 98 - 111 mmol/L   CO2 27 22 - 32 mmol/L   Glucose, Bld 104 (H) 70 - 99 mg/dL    Comment: Glucose reference range applies only to samples taken after fasting for at least 8 hours.   BUN 48 (H) 8 - 23 mg/dL   Creatinine, Ser 6.77 (H) 0.61 - 1.24 mg/dL   Calcium 8.8 (L) 8.9 - 10.3 mg/dL   GFR, Estimated 7 (L) >60 mL/min    Comment: (NOTE) Calculated using the CKD-EPI Creatinine Equation (2021)    Anion gap 15 5 - 15    Comment: Performed at [facility] [address], [place], [place] [address]    DG Chest Port 1 View  Result Date: 11/30/2019 CLINICAL DATA:  Shortness of breath. Increased oxygen requirement. End-stage renal disease on hemodialysis. EXAM: PORTABLE CHEST 1 VIEW COMPARISON:  11/02/2018 FINDINGS: Postoperative changes in the mediastinum. Cardiac enlargement. Developing interstitial disease throughout the lungs likely indicating edema. Pneumonia would be a secondary consideration. No pleural effusions. No pneumothorax. Mediastinal contours appear intact. Calcification of the aorta. IMPRESSION: Cardiac enlargement with developing interstitial edema. Electronically Signed   By: Lucienne Capers M.D.   On: 11/30/2019 04:58    ROS: all other systems reviewed and are negative except as per HPI Blood pressure (!) 115/48, pulse 89, temperature 97.9 F (36.6 C), temperature source Oral, resp. rate (!) 21, height _0  (1.778 m), weight 74.8 kg, SpO2 98 %. .  GEN NAD, sitting in bed HEENT EOMI PERRL wearing glasses NECK + JVD PULM bilateral crackles throughout lung fields CV RRR no m/r/g ABD soft, nontender EXT no LE edema NEURO AAO x 3 nonfocal SKIN no  rashes ACCESS + RUE AVF + T/B  Assessment/Plan: 1 SOB: appears to be pulm edema on xray.  Likely has had weight loss.  Will plan for HD today and tomorrow for reg schedule.  2 ESRD: TTS--> was M/W/S d/t holiday schedule.  HD off schedule today and then tomorrow 3 Hypertension:  UF as tolerated, midodrine with HD 4. Anemia of ESRD:  Hgb 10.8, follow, dose as appropriate 5. Metabolic Bone Disease: hectorol with HD 6.  Nutrition: binders/ renal diet 7.  AVR/CABG 8.  Dispo: admitted  Madelon Lips 11/30/2019, 11:40 AM

## 2019-11-30 NOTE — ED Provider Notes (Signed)
Patient rechecked after he completed dialysis.  States he feels back to his baseline and would like to go home.   Lacretia Leigh, MD 11/30/19 [phone]

## 2019-11-30 NOTE — ED Triage Notes (Signed)
BIB by EMS for shortness of breath, hx of PNA, patient wears PRN O2, EMS placed on 4L [place], report sudden shortness of breath with cold sweats   EMS vitals 88% on pulsating O2 per EMS 138/76 120 HR 20 RR 96% ON 4L [place] CBG 219

## 2019-12-01 ENCOUNTER — Telehealth (HOSPITAL_COMMUNITY): Payer: Self-pay | Admitting: Nephrology

## 2019-12-01 DIAGNOSIS — N2581 Secondary hyperparathyroidism of renal origin: Secondary | ICD-10-CM | POA: Diagnosis not present

## 2019-12-01 DIAGNOSIS — N186 End stage renal disease: Secondary | ICD-10-CM | POA: Diagnosis not present

## 2019-12-01 DIAGNOSIS — D631 Anemia in chronic kidney disease: Secondary | ICD-10-CM | POA: Diagnosis not present

## 2019-12-01 DIAGNOSIS — Z992 Dependence on renal dialysis: Secondary | ICD-10-CM | POA: Diagnosis not present

## 2019-12-01 DIAGNOSIS — D509 Iron deficiency anemia, unspecified: Secondary | ICD-10-CM | POA: Diagnosis not present

## 2019-12-01 DIAGNOSIS — N184 Chronic kidney disease, stage 4 (severe): Secondary | ICD-10-CM | POA: Diagnosis not present

## 2019-12-01 NOTE — Telephone Encounter (Signed)
Transition of care contact from inpatient facility  Date of discharge: 11/30/19 Date of contact: 12/01/19 Method: Phone Spoke to: Patient  Patient contacted to discuss transition of care from recent inpatient hospitalization. Patient was admitted to [facility] from 11/30/19 with discharge diagnosis of dyspnea/pulm edema. He was dialyzed and discharged home with a lower weight.  Medication changes were reviewed. No changes made during admit.  He went to dialysis today - says they couldn't get any fluid off him. But that his breathing is good for now.  Called his HD unit - came in at 73.9kg, no net UF as BP was in 80's throughout his treatment. Left at 73.9kg. He does not take antihypertensives, in fact is supposed to be on midodrine 10mg  TID.  Will add BP parameters to his outpatient HD orders - can continue to pull fluid if SBP > 85 and asymptomatic. If not, I am worried he will be back in the hospital.  If gets into trouble with breathing tomorrow - told to call his HD unit first thing Monday morning for an extra HD.  Veneta Penton, PA-C [employer] Pager [phone]

## 2019-12-03 ENCOUNTER — Encounter: Payer: Self-pay | Admitting: Family

## 2019-12-03 ENCOUNTER — Telehealth: Payer: Self-pay

## 2019-12-03 ENCOUNTER — Other Ambulatory Visit: Payer: Self-pay

## 2019-12-03 ENCOUNTER — Ambulatory Visit (INDEPENDENT_AMBULATORY_CARE_PROVIDER_SITE_OTHER): Payer: Medicare Other | Admitting: Family

## 2019-12-03 VITALS — BP 120/80 | HR 88 | Temp 97.3°F | Resp 16 | Ht 70.0 in | Wt 164.2 lb

## 2019-12-03 DIAGNOSIS — I255 Ischemic cardiomyopathy: Secondary | ICD-10-CM | POA: Diagnosis not present

## 2019-12-03 DIAGNOSIS — I5022 Chronic systolic (congestive) heart failure: Secondary | ICD-10-CM | POA: Diagnosis not present

## 2019-12-03 DIAGNOSIS — J9611 Chronic respiratory failure with hypoxia: Secondary | ICD-10-CM | POA: Diagnosis not present

## 2019-12-03 NOTE — Telephone Encounter (Signed)
Patients son called to request if they could have an order for oxygen for his father he had a  problem with breathing over the holiday and was sent to hospital by ambulance  and was treated for Pulmonary Edema and the son said he was given oxygen and the edema was removed he would like to have the oxygen on hand in case this would happen again apposed to patient having to be sent to hospital again by ambulance he would like to go and pick up oxygen today if at all possible   Please Advise

## 2019-12-03 NOTE — Telephone Encounter (Signed)
I called patients son back and informed him of what Dr. Mariea Clonts had said and he said the nurse there had come in and said he would need to be seen first he had made an appointment for his father for later this week  but was it anyway he could be seen today I checked and found an open slot with Charyl Dancer NP he was alright with this and will bring his father in at 2:45 pm today

## 2019-12-03 NOTE — Progress Notes (Addendum)
Provider: Virgilio Broadhead FNP-C  Gayland Curry, DO  Patient Care Team: Gayland Curry, DO as PCP - General (Geriatric Medicine) Jamal Maes, MD as Consulting Physician (Nephrology) Burtis Junes, NP as Nurse Practitioner (Nurse Practitioner) Center, [facility]  Extended Emergency Contact Information Primary Emergency Contact: [facility] Phone: [phone] Relation: Son Secondary Emergency Contact: [name],Ilene Address: [address]           [place], [place] [address] [place] Work Phone: [phone] Mobile Phone: [phone] Relation: Daughter  Code Status:  DNR Goals of care: Advanced Directive information Advanced Directives 12/03/2019  Does Patient Have a Medical Advance Directive? Yes  Type of Paramedic of [place];Out of facility DNR (pink MOST or yellow form)  Does patient want to make changes to medical advance directive? No - Patient declined  Copy of [place] in Chart? Yes - validated most recent copy scanned in chart (See row information)  Would patient like information on creating a medical advance directive? -  Pre-existing out of facility DNR order (yellow form or pink MOST form) -     Chief Complaint  Patient presents with  . Acute Visit    Having trouble breathing wants script for oxygen.    HPI:  Pt is a 86 y.o. male seen today for an acute visit for evaluation of trouble breathing request oxygen concentrator. He is with wife and  son who is the POA. He states was in the ED 11/30/2019 with dyspnea and pulmonary edema.Workup revealed fluid overload.Had dialysis at the hospital and was discharge home after dialysis.Usually goes for dialysis on Tue,Thur and Sat. He states uses portable oxygen that he bought by himself.he request a continuous oxygen concentrator that he can use whenever he is short of breath especially days prior to dialysis.Both patient and son thinks  recent shortness of breath was due to lack of oxygen.Discussed fluid overload with both but patient states felt better at the hospital when oxygen was applied. He denies any edema,cough,wheezing or shortness of breath during visit. He has had a 2 lbs weight gain over one month. Has dialysis in the morning.  He has a significant medical history of  Chronic Systolic congestive Heart Failure,chronic respiratory failure with Hypoxia,ESRD on Hemodialysis,Pulmonary edema among other condition.    Past Medical History:  Diagnosis Date  . Anxiety   . Arthritis   . CHF (congestive heart failure) (HCC)    chronic systolic CHF  . Complication of anesthesia    " one time my heart stopped due to a medication that I was on."   . Coronary artery disease   . ESRD (end stage renal disease) on dialysis ([place])    Tues, Thursday, Saturday; [facility]; [facility] (10/10/2017)  . GERD (gastroesophageal reflux disease)   . Gout   . Heart murmur    as a teen  . Herpes zoster 04/2012  . Hypertension    08/28/19- now has hypotension  . Left bundle branch block   . Pneumonia 10/2018  . Shortness of breath dyspnea    with exertion  "walking, probably due to my heart not eorking well"   Past Surgical History:  Procedure Laterality Date  . AORTIC VALVE REPLACEMENT N/A 12/09/2014   Procedure: AORTIC VALVE REPLACEMENT (AVR) WITH 23MM MAGNA EASE BIOPROSTHETIC VALVE;  Surgeon: Gaye Pollack, MD;  Location: [facility];  Service: Open Heart Surgery;  Laterality: N/A;  . APPENDECTOMY  1944  . AV FISTULA  PLACEMENT Left    Left Cim[place] AVF placed in [place]   . AV FISTULA PLACEMENT Right 03/06/2014   Procedure: ARTERIOVENOUS (AV) FISTULA CREATION-right radiocephalic;  Surgeon: Mal Misty, MD;  Location: [facility] OR;  Service: Vascular;  Laterality: Right;  . AV FISTULA PLACEMENT Right 07/15/2014   Procedure: ARTERIOVENOUS (AV) FISTULA CREATION;  Surgeon: Elam Dutch, MD;  Location: [facility];  Service: Vascular;   Laterality: Right;  . CARDIAC CATHETERIZATION N/A 11/27/2014   Procedure: Right/Left Heart Cath and Coronary Angiography;  Surgeon: Burnell Blanks, MD;  Location: [facility];  Service: Cardiovascular;  Laterality: N/A;  . CATARACT EXTRACTION W/ INTRAOCULAR LENS IMPLANT Bilateral 2014   [facility]  . COLONOSCOPY  2013   Dr. Annamaria Helling [place], [place].  . CORONARY ARTERY BYPASS GRAFT N/A 12/09/2014   Procedure: CORONARY ARTERY BYPASS GRAFTING (CABG), ON PUMP, TIMES THREE, USING LEFT INTERNAL MAMMARY ARTERY, RIGHT GREATER SAPHENOUS VEIN HARVESTED ENDOSCOPICALLY;  Surgeon: Gaye Pollack, MD;  Location: [facility];  Service: Open Heart Surgery;  Laterality: N/A;  LIMA-LAD; SVG-OM; SVG-PD  . DIALYSIS FISTULA CREATION  08/04/2012 and 10/13   Dr. Harden Mo  . FISTULA SUPERFICIALIZATION Right 08/29/2019   Procedure: RIGHT ARTERIOVENOUS FISTULA  PLICATION;  Surgeon: Serafina Mitchell, MD;  [facility];  Service: Vascular;  Laterality: Right;  . FISTULOGRAM Right 04/12/2019   Procedure: FISTULOGRAM with Balloon Venoplasty of Right Peripheral Vein;  Surgeon: Serafina Mitchell, MD;  Location: [facility];  Service: Vascular;  Laterality: Right;  . FISTULOGRAM Right 08/29/2019   Procedure: FISTULOGRAM;  Surgeon: Serafina Mitchell, MD;  Location: [facility] OR;  Service: Vascular;  Laterality: Right;  . INSERTION OF DIALYSIS CATHETER Right 01-15-14   Right chest TDC placed by Dr. Augustin Coupe at [facility]  . LIGATION OF ARTERIOVENOUS  FISTULA Right 07/15/2014   Procedure: LIGATION OF ARTERIOVENOUS  FISTULA  (RIGHT RADIOCEPHALIC);  Surgeon: Elam Dutch, MD;  Location: [facility];  Service: Vascular;  Laterality: Right;  . LIGATION OF COMPETING BRANCHES OF ARTERIOVENOUS FISTULA Right 05/08/2014   Procedure: LIGATION OF COMPETING BRANCHES OF RIGHT ARM RADIOCEPHALIC ARTERIOVENOUS FISTULA;  Surgeon: Mal Misty, MD;  Location: [facility];  Service: Vascular;  Laterality: Right;  . RESECTION OF ARTERIOVENOUS FISTULA ANEURYSM  Right 08/29/2019   Procedure: RESECTION OF ARTERIOVENOUS FISTULA ANEURYSM;  Surgeon: Serafina Mitchell, MD;  Location: [facility];  Service: Vascular;  Laterality: Right;  . REVISON OF ARTERIOVENOUS FISTULA Right 07/15/2014   Procedure: EXPLORATION OF ARTERIOVENOUS FISTULA;  Surgeon: Elam Dutch, MD;  Location: [facility];  Service: Vascular;  Laterality: Right;  . REVISON OF ARTERIOVENOUS FISTULA Right 04/12/2019   Procedure: REVISON OF ARTERIOVENOUS FISTULA;  Surgeon: Serafina Mitchell, MD;  Location: [facility];  Service: Vascular;  Laterality: Right;  . TEE WITHOUT CARDIOVERSION N/A 12/09/2014   Procedure: TRANSESOPHAGEAL ECHOCARDIOGRAM (TEE);  Surgeon: Gaye Pollack, MD;  Location: [facility];  Service: Open Heart Surgery;  Laterality: N/A;  . VENOPLASTY Right 08/29/2019   Procedure: PERIPHERAL VENOPLASTY;  Surgeon: Serafina Mitchell, MD;  [facility];  Service: Vascular;  Laterality: Right;    No Known Allergies  Outpatient Encounter Medications as of 12/03/2019  Medication Sig  . acetaminophen (TYLENOL) 500 MG tablet Take 1,000 mg by mouth 3 (three) times daily as needed for moderate pain.   Marland Kitchen allopurinol (ZYLOPRIM) 100 MG tablet TAKE 1 TABLET BY MOUTH DAILY  . atorvastatin (LIPITOR) 20 MG tablet TAKE 1 TABLET BY  MOUTH DAILY FOR CHOLESTEROL  . calcium carbonate (TUMS EX) 750 MG chewable tablet Chew 1,500 mg by mouth 3 (three) times daily with meals.  . cinacalcet (SENSIPAR) 60 MG tablet Take 60 mg by mouth daily.   . midodrine (PROAMATINE) 10 MG tablet Take 10 mg by mouth 3 (three) times daily.  Marland Kitchen omeprazole (PRILOSEC) 20 MG capsule Take 20 mg by mouth daily.    No facility-administered encounter medications on file as of 12/03/2019.    Review of Systems  Constitutional: Positive for fatigue. Negative for appetite change, chills and fever.  HENT: Negative for congestion, postnasal drip, rhinorrhea, sinus pressure, sinus pain, sneezing, sore throat and trouble swallowing.   Respiratory: Negative for  cough, chest tightness and wheezing.        Status post ED visit for dyspnea   Cardiovascular: Negative for chest pain, palpitations and leg swelling.  Gastrointestinal: Negative for abdominal distention, abdominal pain, constipation, diarrhea, nausea and vomiting.  Genitourinary:       On hemodialysis Tue,thur and Sat   Musculoskeletal: Positive for gait problem. Negative for joint swelling and myalgias.  Skin: Negative for color change, pallor and rash.  Neurological: Negative for dizziness, speech difficulty, light-headedness, numbness and headaches.  Hematological: Does not bruise/bleed easily.  Psychiatric/Behavioral: Negative for agitation, confusion and sleep disturbance. The patient is not nervous/anxious.     Immunization History  Administered Date(s) Administered  . Hepatitis B, adult 06/02/2013, 07/03/2013, 08/02/2013  . Influenza, High Dose Seasonal PF 10/02/2017, 09/21/2018, 11/02/2019  . Influenza-Unspecified 10/04/2012, 10/05/2015, 10/04/2016, 09/05/2018  . Moderna SARS-COVID-2 Vaccination 01/06/2019, 02/13/2019, 11/20/2019  . Pneumococcal Conjugate-13 04/30/2015  . Pneumococcal Polysaccharide-23 06/04/2008  . Tdap 06/01/2017  . Zoster 01/05/2011   Pertinent  Health Maintenance Due  Topic Date Due  . INFLUENZA VACCINE  Completed  . PNA vac Low Risk Adult  Completed   Fall Risk  12/03/2019 10/17/2019 06/13/2019 03/19/2019 01/24/2019  Falls in the past year? 1 1 1  0 0  Number falls in past yr: 0 0 0 0 0  Injury with Fall? 0 0 0 0 0  Risk for fall due to : - - - - -   Functional Status Survey:    Vitals:   12/03/19 1455  BP: 120/80  Pulse: 88  Resp: 16  Temp: (!) 97.3 F (36.3 C)  SpO2: 94%  Weight: 164 lb 3.2 oz (74.5 kg)  Height: 5\' 10"  (1.778 m)   Body mass index is 23.56 kg/m. Physical Exam Vitals reviewed.  Constitutional:      General: He is not in acute distress.    Appearance: He is not ill-appearing.  HENT:     Head: Normocephalic.     Nose:  Nose normal. No congestion or rhinorrhea.     Mouth/Throat:     Mouth: Mucous membranes are moist.     Pharynx: Oropharynx is clear. No oropharyngeal exudate or posterior oropharyngeal erythema.  Eyes:     General: No scleral icterus.       Right eye: No discharge.        Left eye: No discharge.     Conjunctiva/sclera: Conjunctivae normal.     Pupils: Pupils are equal, round, and reactive to light.  Cardiovascular:     Rate and Rhythm: Normal rate and regular rhythm.     Pulses: Normal pulses.     Heart sounds: Murmur heard.  No friction rub. No gallop.   Pulmonary:     Effort: Pulmonary effort is normal. No respiratory distress.  Breath sounds: Normal breath sounds. No wheezing, rhonchi or rales.     Comments: Oxygen saturation was 95-96 % at rest and with ambulation for short distance.  Chest:     Chest wall: No tenderness.  Abdominal:     General: Bowel sounds are normal. There is no distension.     Palpations: Abdomen is soft. There is no mass.     Tenderness: There is no abdominal tenderness. There is no right CVA tenderness, left CVA tenderness, guarding or rebound.  Musculoskeletal:        General: No swelling or tenderness.     Right lower leg: No edema.     Left lower leg: No edema.     Comments: Unsteady gait  Skin:    General: Skin is warm and dry.     Coloration: Skin is not pale.     Findings: No bruising, erythema or rash.  Neurological:     Mental Status: He is alert.     Gait: Gait abnormal.  Psychiatric:        Mood and Affect: Affect is angry.        Speech: Speech normal.        Behavior: Behavior is agitated.        Thought Content: Thought content normal.        Cognition and Memory: Memory is impaired.     Comments: Got angry with son during visit when son requested for a portable oxygen but patient stated had one already just need a concentrator.      Labs reviewed: Recent Labs    04/12/19 0646 08/29/19 0953 11/30/19 0555  NA 136 138 135    K 5.0 4.3 5.8*  CL 100 97* 93*  CO2  --   --  27  GLUCOSE 111* 110* 104*  BUN 56* 43* 48*  CREATININE 9.20* 7.20* 6.77*  CALCIUM  --   --  8.8*    Recent Labs    04/12/19 0646 08/29/19 0953 11/30/19 0339  WBC  --   --  10.4  NEUTROABS  --   --  8.1*  HGB 10.9* 12.2* 10.8*  HCT 32.0* 36.0* 33.4*  MCV  --   --  108.1*  PLT  --   --  226   No results found for: TSH Lab Results  Component Value Date   HGBA1C 5.2 12/06/2014   Lab Results  Component Value Date   CHOL 125 02/07/2017   HDL 37 (L) 02/07/2017   LDLCALC 72 02/07/2017   TRIG 80 02/07/2017   CHOLHDL 3.4 02/07/2017    Significant Diagnostic Results in last 30 days:  DG CHEST PORT 1 VIEW  Result Date: 11/30/2019 CLINICAL DATA:  86 year old male with pulmonary edema. EXAM: PORTABLE CHEST 1 VIEW COMPARISON:  Chest radiograph dated 11/30/2019. FINDINGS: Overall interval improvement in the edema compared to the earlier radiograph. There are however small bilateral pleural effusions, increased since the prior radiograph. There are bibasilar atelectasis or infiltrate. No pneumothorax. Stable cardiomegaly. Atherosclerotic calcification of the aorta. Median sternotomy wires and CABG vascular clips and mechanical cardiac valve. No acute osseous pathology. IMPRESSION: 1. Overall interval improvement in the pulmonary edema since the earlier radiograph. 2. Small bilateral pleural effusions, increased since the prior radiograph. Electronically Signed   By: Anner Crete M.D.   On: 11/30/2019 17:19   DG Chest Port 1 View  Result Date: 11/30/2019 CLINICAL DATA:  Shortness of breath. Increased oxygen requirement. End-stage renal disease on hemodialysis. EXAM: PORTABLE  CHEST 1 VIEW COMPARISON:  11/02/2018 FINDINGS: Postoperative changes in the mediastinum. Cardiac enlargement. Developing interstitial disease throughout the lungs likely indicating edema. Pneumonia would be a secondary consideration. No pleural effusions. No  pneumothorax. Mediastinal contours appear intact. Calcification of the aorta. IMPRESSION: Cardiac enlargement with developing interstitial edema. Electronically Signed   By: Lucienne Capers M.D.   On: 11/30/2019 04:58    Assessment/Plan 1. Chronic respiratory failure with hypoxia (HCC) Oxygen saturation was 95-96 % at rest and with ambulation for short distance.  Suspect shortness of breath is multifactorial due to CHF,respiratory failure and ESRD.request Portable continuous oxygen concentrator 5 Liters via nasal cannula to use as needed especially at night prior to HD. Script written for concentrator and given to son to deliver to [facility] facility Nurse in charge of DME.   - For home use only DME Other see comment: Portable continuous oxygen concentrator   2. Chronic systolic CHF (congestive heart failure) (HCC) No signs of fluid overload. Continue to monitor  - continue on HD   Family/ staff Communication: Reviewed plan of care with patient,wife and Son   Labs/tests ordered: None   Next Appointment: Has upcoming appointment with Dr.Reed 12/05/2019   Sandrea Hughs, NP

## 2019-12-03 NOTE — Patient Instructions (Signed)
Use continuous oxygen concentrator 1-5 Liters of oxygen as needed for shortness of breath or wheezing

## 2019-12-03 NOTE — Telephone Encounter (Signed)
I have already asked [facility] to address this after getting a voicemail over the weekend about it.

## 2019-12-04 ENCOUNTER — Telehealth: Payer: Self-pay

## 2019-12-04 DIAGNOSIS — N2581 Secondary hyperparathyroidism of renal origin: Secondary | ICD-10-CM | POA: Diagnosis not present

## 2019-12-04 DIAGNOSIS — D509 Iron deficiency anemia, unspecified: Secondary | ICD-10-CM | POA: Diagnosis not present

## 2019-12-04 DIAGNOSIS — Z992 Dependence on renal dialysis: Secondary | ICD-10-CM | POA: Diagnosis not present

## 2019-12-04 DIAGNOSIS — D631 Anemia in chronic kidney disease: Secondary | ICD-10-CM | POA: Diagnosis not present

## 2019-12-04 DIAGNOSIS — N186 End stage renal disease: Secondary | ICD-10-CM | POA: Diagnosis not present

## 2019-12-04 DIAGNOSIS — N184 Chronic kidney disease, stage 4 (severe): Secondary | ICD-10-CM | POA: Diagnosis not present

## 2019-12-04 NOTE — Telephone Encounter (Signed)
Incoming call received from patients son Eric [name] stating he tried to give staff at [facility] the order [name] gave yesterday for oxygen and he was told because his dad is an independent resident they will not process that order for oxygen. We need to submit an order to company of choice.   Eric [name] then tried to call [employer] and was told the same thing that an order has to be submitted from the ordering physicians office. I informed Eric [name] that in my experience the doctor's office are responsible for submitting the order.  Eric [name] is aware I will send this message to Eric [name] and her Medical Assistant to further address. Eric [name] is asking that we handle this message as soon as possible to avoid further delay in care.   Form for [employer] and OV note from yesterday printed and placed in [name]'s folder on desk. Once complete form and OV note will need to be placed in the outgoing faxes bin for the administrative staff to further process.   Please advise when this is complete

## 2019-12-04 NOTE — Telephone Encounter (Signed)
Paper work completed placed on basket to be faxed.

## 2019-12-04 NOTE — Telephone Encounter (Signed)
It appears [name] completed this earlier and it was placed to fax.

## 2019-12-04 NOTE — Telephone Encounter (Signed)
Autumn [facility] nurse) called and wanted to know the update on Mr. Eric [name] oxygen. She said there is no order and wondered what company would be handling it.She is waiting on the order

## 2019-12-04 NOTE — Telephone Encounter (Signed)
We discussed yesterday with patient's son and consulted with Dr.Reed who had advised to given him orders to take to facility Nurse.Per Dr.Reed oxygen will be obtained faster this way. Will fill out form then fax to DME.

## 2019-12-04 NOTE — Telephone Encounter (Signed)
Message left on clinical intake voicemail:  Gilda with [employer] called stating they received order and patient does not qualify for oxygen, patient would need to be at 88 % or below at rest or when ambulating to qualify for oxygen

## 2019-12-04 NOTE — Telephone Encounter (Signed)
I called Collier Salina back to let him know that I was not able to make contact with Dr.Reed today.  Peter plans to order oxygen and pay out of pocket. Patient has an appointment tomorrow with Dr.Reed and will further address at that time

## 2019-12-04 NOTE — Telephone Encounter (Signed)
I called Collier Salina (patients son) and he stated he will just figure out a way to buy an oxygen tank out of pocket. Collier Salina states there is no way that his dad would have made it to dialysis today without oxygen and he is frustrated.   Collier Salina states the clock is ticking and his dad needs oxygen.   Peter asked that I call Dr.Reed to see if anything can be done today, Collier Salina was still with his dad at [place] at the time of call

## 2019-12-04 NOTE — Telephone Encounter (Signed)
Daisy from [facility] who is in charge of ordering supplies to the patients would like the order faxed to [facility] fax number [phone].

## 2019-12-04 NOTE — Telephone Encounter (Addendum)
Mickel Baas called for Eric [name] (phone [phone], fax [phone]) called in regards to order they received for patient's oxygen.  She stated they had to have certain saturation perimeters met - resting, exertion, and recovery O2. Your note yesterday documented saturation of 95-96%. Saturation has to be below 80% to qualify for oxygen per [employer].   He is a [facility] patient and per note from Dr. Mariea Clonts she thought this issue had been addressed there.  Please advise.

## 2019-12-05 ENCOUNTER — Encounter: Payer: Self-pay | Admitting: Internal Medicine

## 2019-12-05 ENCOUNTER — Other Ambulatory Visit: Payer: Self-pay

## 2019-12-05 ENCOUNTER — Telehealth: Payer: Self-pay

## 2019-12-05 ENCOUNTER — Non-Acute Institutional Stay: Payer: Medicare Other | Admitting: Internal Medicine

## 2019-12-05 VITALS — BP 118/64 | HR 76 | Temp 97.5°F | Ht 70.0 in | Wt 163.0 lb

## 2019-12-05 DIAGNOSIS — I255 Ischemic cardiomyopathy: Secondary | ICD-10-CM

## 2019-12-05 DIAGNOSIS — Z992 Dependence on renal dialysis: Secondary | ICD-10-CM

## 2019-12-05 DIAGNOSIS — N2889 Other specified disorders of kidney and ureter: Secondary | ICD-10-CM | POA: Diagnosis not present

## 2019-12-05 DIAGNOSIS — I953 Hypotension of hemodialysis: Secondary | ICD-10-CM | POA: Diagnosis not present

## 2019-12-05 DIAGNOSIS — R5381 Other malaise: Secondary | ICD-10-CM

## 2019-12-05 DIAGNOSIS — J9611 Chronic respiratory failure with hypoxia: Secondary | ICD-10-CM | POA: Diagnosis not present

## 2019-12-05 DIAGNOSIS — I5022 Chronic systolic (congestive) heart failure: Secondary | ICD-10-CM

## 2019-12-05 DIAGNOSIS — N186 End stage renal disease: Secondary | ICD-10-CM | POA: Diagnosis not present

## 2019-12-05 NOTE — Progress Notes (Signed)
Location:  Occupational psychologist of Service:  Clinic (12)  Provider: Saarah Dewing L. Mariea Clonts, D.O., C.M.D.  Code Status: DNR Goals of Care:  Advanced Directives 12/05/2019  Does Patient Have a Medical Advance Directive? Yes  Type of Paramedic of Talent;Out of facility DNR (pink MOST or yellow form)  Does patient want to make changes to medical advance directive? No - Patient declined  Copy of [place] in Chart? Yes - validated most recent copy scanned in chart (See row information)  Would patient like information on creating a medical advance directive? -  Pre-existing out of facility DNR order (yellow form or pink MOST form) Pink MOST/Yellow Form most recent copy in chart - Physician notified to receive inpatient order     Chief Complaint  Patient presents with  . Medical Management of Chronic Issues    Follow up on ER visit and getting an oxygen tank     HPI: Patient is a 86 y.o. male seen today for f/u after ED visit and office visit where attempt was made to get him oxygen therapy covered by insurance.  Eric [name] has been having more challenges with volume mgt and dyspnea on exertion.  He had a terrible time on 11/26 to where he went to the ED and his CXR revealed acute pulmonary edema.  He gets HD t/r/s and struggles the most on Mon nights when he is going longer b/w treatments.  He has CAD with prior MI and systolic chf.  His son has been in town and trying to get him oxygen therapy.  Unfortunately, this has been a major challenge.  We tried again today to get him qualified for an oxygen concentrator in his apt.  We walked around the clinic area several times and he got too dyspneic to continue and wanted to sit several times during the journey, but the lowest his sats dropped was 90%.  At rest his O2 was 94% prior to the walk.  He feels better when he puts on oxygen at 2-3L.  He does have the inogen portable oxygen, but the pulsed  air does not help him when he cannot breathe exactly in sync with the machine.  He needs continuous oxygen for his comfort.     Past Medical History:  Diagnosis Date  . Anxiety   . Arthritis   . CHF (congestive heart failure) (HCC)    chronic systolic CHF  . Complication of anesthesia    " one time my heart stopped due to a medication that I was on."   . Coronary artery disease   . ESRD (end stage renal disease) on dialysis ([place])    Tues, Thursday, Saturday; [facility]; [facility] (10/10/2017)  . GERD (gastroesophageal reflux disease)   . Gout   . Heart murmur    as a teen  . Herpes zoster 04/2012  . Hypertension    08/28/19- now has hypotension  . Left bundle branch block   . Pneumonia 10/2018  . Shortness of breath dyspnea    with exertion  "walking, probably due to my heart not eorking well"    Past Surgical History:  Procedure Laterality Date  . AORTIC VALVE REPLACEMENT N/A 12/09/2014   Procedure: AORTIC VALVE REPLACEMENT (AVR) WITH 23MM MAGNA EASE BIOPROSTHETIC VALVE;  Surgeon: Gaye Pollack, MD;  Location: [facility] OR;  Service: Open Heart Surgery;  Laterality: N/A;  . APPENDECTOMY  1944  . AV FISTULA PLACEMENT Left  Left [place] AVF placed in [place]   . AV FISTULA PLACEMENT Right 03/06/2014   Procedure: ARTERIOVENOUS (AV) FISTULA CREATION-right radiocephalic;  Surgeon: Mal Misty, MD;  Location: [facility];  Service: Vascular;  Laterality: Right;  . AV FISTULA PLACEMENT Right 07/15/2014   Procedure: ARTERIOVENOUS (AV) FISTULA CREATION;  Surgeon: Elam Dutch, MD;  Location: [facility];  Service: Vascular;  Laterality: Right;  . CARDIAC CATHETERIZATION N/A 11/27/2014   Procedure: Right/Left Heart Cath and Coronary Angiography;  Surgeon: Burnell Blanks, MD;  Location: [facility];  Service: Cardiovascular;  Laterality: N/A;  . CATARACT EXTRACTION W/ INTRAOCULAR LENS IMPLANT Bilateral 2014   [facility]  . COLONOSCOPY  2013   Dr. Annamaria Helling [place],  [place].  . CORONARY ARTERY BYPASS GRAFT N/A 12/09/2014   Procedure: CORONARY ARTERY BYPASS GRAFTING (CABG), ON PUMP, TIMES THREE, USING LEFT INTERNAL MAMMARY ARTERY, RIGHT GREATER SAPHENOUS VEIN HARVESTED ENDOSCOPICALLY;  Surgeon: Gaye Pollack, MD;  Location: [facility];  Service: Open Heart Surgery;  Laterality: N/A;  LIMA-LAD; SVG-OM; SVG-PD  . DIALYSIS FISTULA CREATION  08/04/2012 and 10/13   Dr. Harden Mo  . FISTULA SUPERFICIALIZATION Right 08/29/2019   Procedure: RIGHT ARTERIOVENOUS FISTULA  PLICATION;  Surgeon: Serafina Mitchell, MD;  [facility];  Service: Vascular;  Laterality: Right;  . FISTULOGRAM Right 04/12/2019   Procedure: FISTULOGRAM with Balloon Venoplasty of Right Peripheral Vein;  Surgeon: Serafina Mitchell, MD;  Location: [facility];  Service: Vascular;  Laterality: Right;  . FISTULOGRAM Right 08/29/2019   Procedure: FISTULOGRAM;  Surgeon: Serafina Mitchell, MD;  Location: [facility] OR;  Service: Vascular;  Laterality: Right;  . INSERTION OF DIALYSIS CATHETER Right 01-15-14   Right chest TDC placed by Dr. Augustin Coupe at [facility]  . LIGATION OF ARTERIOVENOUS  FISTULA Right 07/15/2014   Procedure: LIGATION OF ARTERIOVENOUS  FISTULA  (RIGHT RADIOCEPHALIC);  Surgeon: Elam Dutch, MD;  Location: [facility];  Service: Vascular;  Laterality: Right;  . LIGATION OF COMPETING BRANCHES OF ARTERIOVENOUS FISTULA Right 05/08/2014   Procedure: LIGATION OF COMPETING BRANCHES OF RIGHT ARM RADIOCEPHALIC ARTERIOVENOUS FISTULA;  Surgeon: Mal Misty, MD;  Location: [facility];  Service: Vascular;  Laterality: Right;  . RESECTION OF ARTERIOVENOUS FISTULA ANEURYSM Right 08/29/2019   Procedure: RESECTION OF ARTERIOVENOUS FISTULA ANEURYSM;  Surgeon: Serafina Mitchell, MD;  Location: [facility];  Service: Vascular;  Laterality: Right;  . REVISON OF ARTERIOVENOUS FISTULA Right 07/15/2014   Procedure: EXPLORATION OF ARTERIOVENOUS FISTULA;  Surgeon: Elam Dutch, MD;  Location: [facility];  Service: Vascular;  Laterality: Right;  . REVISON  OF ARTERIOVENOUS FISTULA Right 04/12/2019   Procedure: REVISON OF ARTERIOVENOUS FISTULA;  Surgeon: Serafina Mitchell, MD;  Location: [facility];  Service: Vascular;  Laterality: Right;  . TEE WITHOUT CARDIOVERSION N/A 12/09/2014   Procedure: TRANSESOPHAGEAL ECHOCARDIOGRAM (TEE);  Surgeon: Gaye Pollack, MD;  Location: [facility];  Service: Open Heart Surgery;  Laterality: N/A;  . VENOPLASTY Right 08/29/2019   Procedure: PERIPHERAL VENOPLASTY;  Surgeon: Serafina Mitchell, MD;  [facility];  Service: Vascular;  Laterality: Right;    No Known Allergies  Outpatient Encounter Medications as of 12/05/2019  Medication Sig  . acetaminophen (TYLENOL) 500 MG tablet Take 1,000 mg by mouth 3 (three) times daily as needed for moderate pain.   Marland Kitchen allopurinol (ZYLOPRIM) 100 MG tablet TAKE 1 TABLET BY MOUTH DAILY  . atorvastatin (LIPITOR) 20 MG tablet TAKE 1 TABLET BY MOUTH DAILY FOR CHOLESTEROL  .  calcium carbonate (TUMS EX) 750 MG chewable tablet Chew 1,500 mg by mouth 3 (three) times daily with meals.  . cinacalcet (SENSIPAR) 60 MG tablet Take 60 mg by mouth daily.   . midodrine (PROAMATINE) 10 MG tablet Take 10 mg by mouth 3 (three) times daily.  Marland Kitchen omeprazole (PRILOSEC) 20 MG capsule Take 20 mg by mouth daily.    No facility-administered encounter medications on file as of 12/05/2019.    Review of Systems:  Review of Systems  Constitutional: Positive for malaise/fatigue. Negative for chills and fever.  HENT: Negative for congestion and sore throat.   Eyes: Negative for blurred vision.  Respiratory: Positive for shortness of breath. Negative for cough, sputum production and wheezing.   Cardiovascular: Positive for orthopnea and PND. Negative for chest pain, palpitations and leg swelling.  Gastrointestinal: Negative for abdominal pain and constipation.  Genitourinary: Negative for dysuria.  Musculoskeletal: Negative for falls and joint pain.  Skin: Negative for itching and rash.  Neurological: Negative for  dizziness and loss of consciousness.  Endo/Heme/Allergies: Bruises/bleeds easily.  Psychiatric/Behavioral: Negative for depression. The patient is not nervous/anxious and does not have insomnia.     Health Maintenance  Topic Date Due  . TETANUS/TDAP  06/02/2027  . INFLUENZA VACCINE  Completed  . COVID-19 Vaccine  Completed  . PNA vac Low Risk Adult  Completed    Physical Exam: Vitals:   12/05/19 1437  BP: 118/64  Pulse: 76  Temp: (!) 97.5 F (36.4 C)  TempSrc: Temporal  SpO2: 96%  Weight: 163 lb (73.9 kg)  Height: 5\' 10"  (1.778 m)   Body mass index is 23.39 kg/m. Physical Exam Vitals reviewed.  Constitutional:      Appearance: He is ill-appearing.     Comments: Increasingly frail, has lost weight  HENT:     Head: Normocephalic and atraumatic.  Eyes:     Conjunctiva/sclera: Conjunctivae normal.  Cardiovascular:     Rate and Rhythm: Normal rate and regular rhythm.     Heart sounds: Murmur heard.   Pulmonary:     Breath sounds: Rales present.     Comments: Bilateral bases with rales Abdominal:     General: Bowel sounds are normal.  Musculoskeletal:        General: Normal range of motion.     Right lower leg: No edema.     Left lower leg: No edema.  Skin:    General: Skin is warm and dry.  Neurological:     Mental Status: He is alert.     Motor: Weakness present.     Comments: Shuffling gait, stoops forward with hands folded behind him  Psychiatric:        Mood and Affect: Mood normal.     Labs reviewed: Basic Metabolic Panel: Recent Labs    04/12/19 0646 08/29/19 0953 11/30/19 0555  NA 136 138 135  K 5.0 4.3 5.8*  CL 100 97* 93*  CO2  --   --  27  GLUCOSE 111* 110* 104*  BUN 56* 43* 48*  CREATININE 9.20* 7.20* 6.77*  CALCIUM  --   --  8.8*   Liver Function Tests: No results for input(s): AST, ALT, ALKPHOS, BILITOT, PROT, ALBUMIN in the last 8760 hours. No results for input(s): LIPASE, AMYLASE in the last 8760 hours. No results for input(s):  AMMONIA in the last 8760 hours. CBC: Recent Labs    04/12/19 0646 08/29/19 0953 11/30/19 0339  WBC  --   --  10.4  NEUTROABS  --   --  8.1*  HGB 10.9* 12.2* 10.8*  HCT 32.0* 36.0* 33.4*  MCV  --   --  108.1*  PLT  --   --  226   Lipid Panel: No results for input(s): CHOL, HDL, LDLCALC, TRIG, CHOLHDL, LDLDIRECT in the last 8760 hours. Lab Results  Component Value Date   HGBA1C 5.2 12/06/2014    Procedures since last visit: DG CHEST PORT 1 VIEW  Result Date: 11/30/2019 CLINICAL DATA:  86 year old male with pulmonary edema. EXAM: PORTABLE CHEST 1 VIEW COMPARISON:  Chest radiograph dated 11/30/2019. FINDINGS: Overall interval improvement in the edema compared to the earlier radiograph. There are however small bilateral pleural effusions, increased since the prior radiograph. There are bibasilar atelectasis or infiltrate. No pneumothorax. Stable cardiomegaly. Atherosclerotic calcification of the aorta. Median sternotomy wires and CABG vascular clips and mechanical cardiac valve. No acute osseous pathology. IMPRESSION: 1. Overall interval improvement in the pulmonary edema since the earlier radiograph. 2. Small bilateral pleural effusions, increased since the prior radiograph. Electronically Signed   By: Anner Crete M.D.   On: 11/30/2019 17:19   DG Chest Port 1 View  Result Date: 11/30/2019 CLINICAL DATA:  Shortness of breath. Increased oxygen requirement. End-stage renal disease on hemodialysis. EXAM: PORTABLE CHEST 1 VIEW COMPARISON:  11/02/2018 FINDINGS: Postoperative changes in the mediastinum. Cardiac enlargement. Developing interstitial disease throughout the lungs likely indicating edema. Pneumonia would be a secondary consideration. No pleural effusions. No pneumothorax. Mediastinal contours appear intact. Calcification of the aorta. IMPRESSION: Cardiac enlargement with developing interstitial edema. Electronically Signed   By: Lucienne Capers M.D.   On: 11/30/2019 04:58     Assessment/Plan 1. Chronic respiratory failure with hypoxia (HCC) -unfortunately, he did not meet medicare criteria for coverage for oxygen, but his son has ordered a concentrator for him that should be here by tomorrow--may use 2-3L via [place] as needed for sob -he has a tank in his apt in case he has a dyspneic spell in the interim -may use his portable oxygen when out and about if needed  2. Chronic systolic CHF (congestive heart failure) (HCC) -this in combination with his ESRD is getting more challenging to manage and maintain a reasonable QOL for him -all of this volume is not coming off like it did and I hear rales b/w treatments and it's just the next day now -if too much is taken off, he get hypotensive  3. ESRD on dialysis [facility]) -continue at this time; however, we need to discuss when he would consider stopping HD--what would make him want to stop -he is, of course, concerned about his wife, Opal Sidles, but is aware that he is in the right place for her to receive the additional care she needs for her dementia -his son took my card in case of future needs  4. Physical debility -he is less able to assist his wife and his ability to ambulate distances has deteriorated -he is weak and fatigued -a family discussion was had over the holidays and consideration of a move to [place] was made, but they have decided that it would kill Stan's remaining spirit so they've got home care coming in to help Opal Sidles out now   5. Hemodialysis-associated hypotension -limits ability to remove full volume needed to clear his lungs at this point, it appears  Labs/tests ordered: no new Next appt:  02/20/2020  Stephaun Million L. Almetta Liddicoat, D.O. [facility] [address], [place] [address] Cell Phone (Mon-Fri 8am-5pm):  [phone] On  Call:  [phone] & follow prompts after 5pm & weekends Office Phone:  [phone] Office Fax:  [phone]

## 2019-12-05 NOTE — Telephone Encounter (Signed)
Apparently, when he saw [name], his oxygen saturations did not qualify him through insurance to get the oxygen concentrator.  I was not involved in that and yesterday I was amid an advance care planning conversation with another family.  I spoke with Autumn when I got back to my office and she indicated that patient now had what he needed in his apt based on [name]'s orders.  I will discuss with them this afternoon.

## 2019-12-05 NOTE — Telephone Encounter (Signed)
Eric [name] with [employer] called about form that was faxed needing additional information for oxygen order for Eric [name]. She stated that she had just faxed form this morning to the office, but will try to fax it to [place] as well.  [phone] ext.[place]

## 2019-12-05 NOTE — Telephone Encounter (Signed)
Colletta Maryland with [employer] called about form that was faxed needing additional information for oxygen order for Mr. [place]. She stated that she had just faxed form this morning to the office, but will try to fax it to [place] as well. Routed to Dr. Mariea Clonts and Adella Hare, [place] for their information.  [phone] ext.[place]

## 2019-12-06 ENCOUNTER — Telehealth: Payer: Self-pay

## 2019-12-06 DIAGNOSIS — D509 Iron deficiency anemia, unspecified: Secondary | ICD-10-CM | POA: Diagnosis not present

## 2019-12-06 DIAGNOSIS — D631 Anemia in chronic kidney disease: Secondary | ICD-10-CM | POA: Diagnosis not present

## 2019-12-06 DIAGNOSIS — Z992 Dependence on renal dialysis: Secondary | ICD-10-CM | POA: Diagnosis not present

## 2019-12-06 DIAGNOSIS — N186 End stage renal disease: Secondary | ICD-10-CM | POA: Diagnosis not present

## 2019-12-06 DIAGNOSIS — N184 Chronic kidney disease, stage 4 (severe): Secondary | ICD-10-CM | POA: Diagnosis not present

## 2019-12-06 DIAGNOSIS — N2581 Secondary hyperparathyroidism of renal origin: Secondary | ICD-10-CM | POA: Diagnosis not present

## 2019-12-06 NOTE — Telephone Encounter (Signed)
Adrienne with [facility] called to check the status of an order for oxygen. Vincente Liberty states patient does not qualify for oxygen according to the script that [place] gave to patient.  [employer] would need more documentation before he could qualify. I informed Vincente Liberty that she can close this case.  Patient was seen by Dr.Reed yesterday at [facility]. Patient's son, Collier Salina has concluded that he will pay for oxygen out of pocket (refer to previous/multiple phone notes over the last week).   No additional action is needed at this time.

## 2019-12-08 DIAGNOSIS — D509 Iron deficiency anemia, unspecified: Secondary | ICD-10-CM | POA: Diagnosis not present

## 2019-12-08 DIAGNOSIS — Z992 Dependence on renal dialysis: Secondary | ICD-10-CM | POA: Diagnosis not present

## 2019-12-08 DIAGNOSIS — N2581 Secondary hyperparathyroidism of renal origin: Secondary | ICD-10-CM | POA: Diagnosis not present

## 2019-12-08 DIAGNOSIS — N186 End stage renal disease: Secondary | ICD-10-CM | POA: Diagnosis not present

## 2019-12-08 DIAGNOSIS — D631 Anemia in chronic kidney disease: Secondary | ICD-10-CM | POA: Diagnosis not present

## 2019-12-08 DIAGNOSIS — N184 Chronic kidney disease, stage 4 (severe): Secondary | ICD-10-CM | POA: Diagnosis not present

## 2019-12-11 DIAGNOSIS — Z992 Dependence on renal dialysis: Secondary | ICD-10-CM | POA: Diagnosis not present

## 2019-12-11 DIAGNOSIS — D631 Anemia in chronic kidney disease: Secondary | ICD-10-CM | POA: Diagnosis not present

## 2019-12-11 DIAGNOSIS — N184 Chronic kidney disease, stage 4 (severe): Secondary | ICD-10-CM | POA: Diagnosis not present

## 2019-12-11 DIAGNOSIS — N186 End stage renal disease: Secondary | ICD-10-CM | POA: Diagnosis not present

## 2019-12-11 DIAGNOSIS — N2581 Secondary hyperparathyroidism of renal origin: Secondary | ICD-10-CM | POA: Diagnosis not present

## 2019-12-11 DIAGNOSIS — D509 Iron deficiency anemia, unspecified: Secondary | ICD-10-CM | POA: Diagnosis not present

## 2019-12-13 DIAGNOSIS — D631 Anemia in chronic kidney disease: Secondary | ICD-10-CM | POA: Diagnosis not present

## 2019-12-13 DIAGNOSIS — Z992 Dependence on renal dialysis: Secondary | ICD-10-CM | POA: Diagnosis not present

## 2019-12-13 DIAGNOSIS — N184 Chronic kidney disease, stage 4 (severe): Secondary | ICD-10-CM | POA: Diagnosis not present

## 2019-12-13 DIAGNOSIS — N2581 Secondary hyperparathyroidism of renal origin: Secondary | ICD-10-CM | POA: Diagnosis not present

## 2019-12-13 DIAGNOSIS — N186 End stage renal disease: Secondary | ICD-10-CM | POA: Diagnosis not present

## 2019-12-13 DIAGNOSIS — D509 Iron deficiency anemia, unspecified: Secondary | ICD-10-CM | POA: Diagnosis not present

## 2019-12-15 DIAGNOSIS — N186 End stage renal disease: Secondary | ICD-10-CM | POA: Diagnosis not present

## 2019-12-15 DIAGNOSIS — D631 Anemia in chronic kidney disease: Secondary | ICD-10-CM | POA: Diagnosis not present

## 2019-12-15 DIAGNOSIS — N2581 Secondary hyperparathyroidism of renal origin: Secondary | ICD-10-CM | POA: Diagnosis not present

## 2019-12-15 DIAGNOSIS — N184 Chronic kidney disease, stage 4 (severe): Secondary | ICD-10-CM | POA: Diagnosis not present

## 2019-12-15 DIAGNOSIS — D509 Iron deficiency anemia, unspecified: Secondary | ICD-10-CM | POA: Diagnosis not present

## 2019-12-15 DIAGNOSIS — Z992 Dependence on renal dialysis: Secondary | ICD-10-CM | POA: Diagnosis not present

## 2019-12-18 DIAGNOSIS — D631 Anemia in chronic kidney disease: Secondary | ICD-10-CM | POA: Diagnosis not present

## 2019-12-18 DIAGNOSIS — N2581 Secondary hyperparathyroidism of renal origin: Secondary | ICD-10-CM | POA: Diagnosis not present

## 2019-12-18 DIAGNOSIS — Z992 Dependence on renal dialysis: Secondary | ICD-10-CM | POA: Diagnosis not present

## 2019-12-18 DIAGNOSIS — D509 Iron deficiency anemia, unspecified: Secondary | ICD-10-CM | POA: Diagnosis not present

## 2019-12-18 DIAGNOSIS — N186 End stage renal disease: Secondary | ICD-10-CM | POA: Diagnosis not present

## 2019-12-18 DIAGNOSIS — N184 Chronic kidney disease, stage 4 (severe): Secondary | ICD-10-CM | POA: Diagnosis not present

## 2019-12-20 DIAGNOSIS — Z992 Dependence on renal dialysis: Secondary | ICD-10-CM | POA: Diagnosis not present

## 2019-12-20 DIAGNOSIS — N2581 Secondary hyperparathyroidism of renal origin: Secondary | ICD-10-CM | POA: Diagnosis not present

## 2019-12-20 DIAGNOSIS — N186 End stage renal disease: Secondary | ICD-10-CM | POA: Diagnosis not present

## 2019-12-20 DIAGNOSIS — D631 Anemia in chronic kidney disease: Secondary | ICD-10-CM | POA: Diagnosis not present

## 2019-12-20 DIAGNOSIS — N184 Chronic kidney disease, stage 4 (severe): Secondary | ICD-10-CM | POA: Diagnosis not present

## 2019-12-20 DIAGNOSIS — D509 Iron deficiency anemia, unspecified: Secondary | ICD-10-CM | POA: Diagnosis not present

## 2019-12-22 DIAGNOSIS — D509 Iron deficiency anemia, unspecified: Secondary | ICD-10-CM | POA: Diagnosis not present

## 2019-12-22 DIAGNOSIS — N2581 Secondary hyperparathyroidism of renal origin: Secondary | ICD-10-CM | POA: Diagnosis not present

## 2019-12-22 DIAGNOSIS — N186 End stage renal disease: Secondary | ICD-10-CM | POA: Diagnosis not present

## 2019-12-22 DIAGNOSIS — Z992 Dependence on renal dialysis: Secondary | ICD-10-CM | POA: Diagnosis not present

## 2019-12-22 DIAGNOSIS — N184 Chronic kidney disease, stage 4 (severe): Secondary | ICD-10-CM | POA: Diagnosis not present

## 2019-12-22 DIAGNOSIS — D631 Anemia in chronic kidney disease: Secondary | ICD-10-CM | POA: Diagnosis not present

## 2019-12-25 DIAGNOSIS — N2581 Secondary hyperparathyroidism of renal origin: Secondary | ICD-10-CM | POA: Diagnosis not present

## 2019-12-25 DIAGNOSIS — N186 End stage renal disease: Secondary | ICD-10-CM | POA: Diagnosis not present

## 2019-12-25 DIAGNOSIS — Z992 Dependence on renal dialysis: Secondary | ICD-10-CM | POA: Diagnosis not present

## 2019-12-25 DIAGNOSIS — D509 Iron deficiency anemia, unspecified: Secondary | ICD-10-CM | POA: Diagnosis not present

## 2019-12-25 DIAGNOSIS — N184 Chronic kidney disease, stage 4 (severe): Secondary | ICD-10-CM | POA: Diagnosis not present

## 2019-12-25 DIAGNOSIS — D631 Anemia in chronic kidney disease: Secondary | ICD-10-CM | POA: Diagnosis not present

## 2019-12-27 DIAGNOSIS — Z992 Dependence on renal dialysis: Secondary | ICD-10-CM | POA: Diagnosis not present

## 2019-12-27 DIAGNOSIS — D631 Anemia in chronic kidney disease: Secondary | ICD-10-CM | POA: Diagnosis not present

## 2019-12-27 DIAGNOSIS — N184 Chronic kidney disease, stage 4 (severe): Secondary | ICD-10-CM | POA: Diagnosis not present

## 2019-12-27 DIAGNOSIS — N186 End stage renal disease: Secondary | ICD-10-CM | POA: Diagnosis not present

## 2019-12-27 DIAGNOSIS — D509 Iron deficiency anemia, unspecified: Secondary | ICD-10-CM | POA: Diagnosis not present

## 2019-12-27 DIAGNOSIS — N2581 Secondary hyperparathyroidism of renal origin: Secondary | ICD-10-CM | POA: Diagnosis not present

## 2019-12-30 DIAGNOSIS — D509 Iron deficiency anemia, unspecified: Secondary | ICD-10-CM | POA: Diagnosis not present

## 2019-12-30 DIAGNOSIS — D631 Anemia in chronic kidney disease: Secondary | ICD-10-CM | POA: Diagnosis not present

## 2019-12-30 DIAGNOSIS — Z992 Dependence on renal dialysis: Secondary | ICD-10-CM | POA: Diagnosis not present

## 2019-12-30 DIAGNOSIS — N186 End stage renal disease: Secondary | ICD-10-CM | POA: Diagnosis not present

## 2019-12-30 DIAGNOSIS — N2581 Secondary hyperparathyroidism of renal origin: Secondary | ICD-10-CM | POA: Diagnosis not present

## 2019-12-30 DIAGNOSIS — N184 Chronic kidney disease, stage 4 (severe): Secondary | ICD-10-CM | POA: Diagnosis not present

## 2020-01-01 DIAGNOSIS — N184 Chronic kidney disease, stage 4 (severe): Secondary | ICD-10-CM | POA: Diagnosis not present

## 2020-01-01 DIAGNOSIS — Z992 Dependence on renal dialysis: Secondary | ICD-10-CM | POA: Diagnosis not present

## 2020-01-01 DIAGNOSIS — D509 Iron deficiency anemia, unspecified: Secondary | ICD-10-CM | POA: Diagnosis not present

## 2020-01-01 DIAGNOSIS — N2581 Secondary hyperparathyroidism of renal origin: Secondary | ICD-10-CM | POA: Diagnosis not present

## 2020-01-01 DIAGNOSIS — D631 Anemia in chronic kidney disease: Secondary | ICD-10-CM | POA: Diagnosis not present

## 2020-01-01 DIAGNOSIS — N186 End stage renal disease: Secondary | ICD-10-CM | POA: Diagnosis not present

## 2020-01-03 ENCOUNTER — Other Ambulatory Visit: Payer: Self-pay | Admitting: Nurse Practitioner

## 2020-01-03 DIAGNOSIS — D509 Iron deficiency anemia, unspecified: Secondary | ICD-10-CM | POA: Diagnosis not present

## 2020-01-03 DIAGNOSIS — N2581 Secondary hyperparathyroidism of renal origin: Secondary | ICD-10-CM | POA: Diagnosis not present

## 2020-01-03 DIAGNOSIS — N184 Chronic kidney disease, stage 4 (severe): Secondary | ICD-10-CM | POA: Diagnosis not present

## 2020-01-03 DIAGNOSIS — D631 Anemia in chronic kidney disease: Secondary | ICD-10-CM | POA: Diagnosis not present

## 2020-01-03 DIAGNOSIS — Z992 Dependence on renal dialysis: Secondary | ICD-10-CM | POA: Diagnosis not present

## 2020-01-03 DIAGNOSIS — N186 End stage renal disease: Secondary | ICD-10-CM | POA: Diagnosis not present

## 2020-01-05 DIAGNOSIS — Z992 Dependence on renal dialysis: Secondary | ICD-10-CM | POA: Diagnosis not present

## 2020-01-05 DIAGNOSIS — N186 End stage renal disease: Secondary | ICD-10-CM | POA: Diagnosis not present

## 2020-01-05 DIAGNOSIS — N2889 Other specified disorders of kidney and ureter: Secondary | ICD-10-CM | POA: Diagnosis not present

## 2020-01-06 DIAGNOSIS — D631 Anemia in chronic kidney disease: Secondary | ICD-10-CM | POA: Diagnosis not present

## 2020-01-06 DIAGNOSIS — N184 Chronic kidney disease, stage 4 (severe): Secondary | ICD-10-CM | POA: Diagnosis not present

## 2020-01-06 DIAGNOSIS — N2581 Secondary hyperparathyroidism of renal origin: Secondary | ICD-10-CM | POA: Diagnosis not present

## 2020-01-06 DIAGNOSIS — Z992 Dependence on renal dialysis: Secondary | ICD-10-CM | POA: Diagnosis not present

## 2020-01-06 DIAGNOSIS — N186 End stage renal disease: Secondary | ICD-10-CM | POA: Diagnosis not present

## 2020-01-07 ENCOUNTER — Other Ambulatory Visit: Payer: Self-pay | Admitting: Nurse Practitioner

## 2020-01-08 DIAGNOSIS — D631 Anemia in chronic kidney disease: Secondary | ICD-10-CM | POA: Diagnosis not present

## 2020-01-08 DIAGNOSIS — Z992 Dependence on renal dialysis: Secondary | ICD-10-CM | POA: Diagnosis not present

## 2020-01-08 DIAGNOSIS — N2581 Secondary hyperparathyroidism of renal origin: Secondary | ICD-10-CM | POA: Diagnosis not present

## 2020-01-08 DIAGNOSIS — N186 End stage renal disease: Secondary | ICD-10-CM | POA: Diagnosis not present

## 2020-01-08 DIAGNOSIS — N184 Chronic kidney disease, stage 4 (severe): Secondary | ICD-10-CM | POA: Diagnosis not present

## 2020-01-10 DIAGNOSIS — N2581 Secondary hyperparathyroidism of renal origin: Secondary | ICD-10-CM | POA: Diagnosis not present

## 2020-01-10 DIAGNOSIS — N184 Chronic kidney disease, stage 4 (severe): Secondary | ICD-10-CM | POA: Diagnosis not present

## 2020-01-10 DIAGNOSIS — N186 End stage renal disease: Secondary | ICD-10-CM | POA: Diagnosis not present

## 2020-01-10 DIAGNOSIS — Z992 Dependence on renal dialysis: Secondary | ICD-10-CM | POA: Diagnosis not present

## 2020-01-10 DIAGNOSIS — D631 Anemia in chronic kidney disease: Secondary | ICD-10-CM | POA: Diagnosis not present

## 2020-01-12 DIAGNOSIS — N186 End stage renal disease: Secondary | ICD-10-CM | POA: Diagnosis not present

## 2020-01-12 DIAGNOSIS — D631 Anemia in chronic kidney disease: Secondary | ICD-10-CM | POA: Diagnosis not present

## 2020-01-12 DIAGNOSIS — N2581 Secondary hyperparathyroidism of renal origin: Secondary | ICD-10-CM | POA: Diagnosis not present

## 2020-01-12 DIAGNOSIS — Z992 Dependence on renal dialysis: Secondary | ICD-10-CM | POA: Diagnosis not present

## 2020-01-12 DIAGNOSIS — N184 Chronic kidney disease, stage 4 (severe): Secondary | ICD-10-CM | POA: Diagnosis not present

## 2020-01-15 DIAGNOSIS — D631 Anemia in chronic kidney disease: Secondary | ICD-10-CM | POA: Diagnosis not present

## 2020-01-15 DIAGNOSIS — N2581 Secondary hyperparathyroidism of renal origin: Secondary | ICD-10-CM | POA: Diagnosis not present

## 2020-01-15 DIAGNOSIS — N186 End stage renal disease: Secondary | ICD-10-CM | POA: Diagnosis not present

## 2020-01-15 DIAGNOSIS — N184 Chronic kidney disease, stage 4 (severe): Secondary | ICD-10-CM | POA: Diagnosis not present

## 2020-01-15 DIAGNOSIS — Z992 Dependence on renal dialysis: Secondary | ICD-10-CM | POA: Diagnosis not present

## 2020-01-17 DIAGNOSIS — N184 Chronic kidney disease, stage 4 (severe): Secondary | ICD-10-CM | POA: Diagnosis not present

## 2020-01-17 DIAGNOSIS — N2581 Secondary hyperparathyroidism of renal origin: Secondary | ICD-10-CM | POA: Diagnosis not present

## 2020-01-17 DIAGNOSIS — D631 Anemia in chronic kidney disease: Secondary | ICD-10-CM | POA: Diagnosis not present

## 2020-01-17 DIAGNOSIS — N186 End stage renal disease: Secondary | ICD-10-CM | POA: Diagnosis not present

## 2020-01-17 DIAGNOSIS — Z992 Dependence on renal dialysis: Secondary | ICD-10-CM | POA: Diagnosis not present

## 2020-01-19 DIAGNOSIS — N184 Chronic kidney disease, stage 4 (severe): Secondary | ICD-10-CM | POA: Diagnosis not present

## 2020-01-19 DIAGNOSIS — Z992 Dependence on renal dialysis: Secondary | ICD-10-CM | POA: Diagnosis not present

## 2020-01-19 DIAGNOSIS — N186 End stage renal disease: Secondary | ICD-10-CM | POA: Diagnosis not present

## 2020-01-19 DIAGNOSIS — N2581 Secondary hyperparathyroidism of renal origin: Secondary | ICD-10-CM | POA: Diagnosis not present

## 2020-01-19 DIAGNOSIS — D631 Anemia in chronic kidney disease: Secondary | ICD-10-CM | POA: Diagnosis not present

## 2020-01-22 DIAGNOSIS — D631 Anemia in chronic kidney disease: Secondary | ICD-10-CM | POA: Diagnosis not present

## 2020-01-22 DIAGNOSIS — Z992 Dependence on renal dialysis: Secondary | ICD-10-CM | POA: Diagnosis not present

## 2020-01-22 DIAGNOSIS — N2581 Secondary hyperparathyroidism of renal origin: Secondary | ICD-10-CM | POA: Diagnosis not present

## 2020-01-22 DIAGNOSIS — N184 Chronic kidney disease, stage 4 (severe): Secondary | ICD-10-CM | POA: Diagnosis not present

## 2020-01-22 DIAGNOSIS — N186 End stage renal disease: Secondary | ICD-10-CM | POA: Diagnosis not present

## 2020-01-24 DIAGNOSIS — N2581 Secondary hyperparathyroidism of renal origin: Secondary | ICD-10-CM | POA: Diagnosis not present

## 2020-01-24 DIAGNOSIS — Z992 Dependence on renal dialysis: Secondary | ICD-10-CM | POA: Diagnosis not present

## 2020-01-24 DIAGNOSIS — N184 Chronic kidney disease, stage 4 (severe): Secondary | ICD-10-CM | POA: Diagnosis not present

## 2020-01-24 DIAGNOSIS — N186 End stage renal disease: Secondary | ICD-10-CM | POA: Diagnosis not present

## 2020-01-24 DIAGNOSIS — D631 Anemia in chronic kidney disease: Secondary | ICD-10-CM | POA: Diagnosis not present

## 2020-01-26 DIAGNOSIS — N2581 Secondary hyperparathyroidism of renal origin: Secondary | ICD-10-CM | POA: Diagnosis not present

## 2020-01-26 DIAGNOSIS — Z992 Dependence on renal dialysis: Secondary | ICD-10-CM | POA: Diagnosis not present

## 2020-01-26 DIAGNOSIS — N186 End stage renal disease: Secondary | ICD-10-CM | POA: Diagnosis not present

## 2020-01-26 DIAGNOSIS — N184 Chronic kidney disease, stage 4 (severe): Secondary | ICD-10-CM | POA: Diagnosis not present

## 2020-01-26 DIAGNOSIS — D631 Anemia in chronic kidney disease: Secondary | ICD-10-CM | POA: Diagnosis not present

## 2020-01-29 DIAGNOSIS — N2581 Secondary hyperparathyroidism of renal origin: Secondary | ICD-10-CM | POA: Diagnosis not present

## 2020-01-29 DIAGNOSIS — N184 Chronic kidney disease, stage 4 (severe): Secondary | ICD-10-CM | POA: Diagnosis not present

## 2020-01-29 DIAGNOSIS — Z992 Dependence on renal dialysis: Secondary | ICD-10-CM | POA: Diagnosis not present

## 2020-01-29 DIAGNOSIS — D631 Anemia in chronic kidney disease: Secondary | ICD-10-CM | POA: Diagnosis not present

## 2020-01-29 DIAGNOSIS — N186 End stage renal disease: Secondary | ICD-10-CM | POA: Diagnosis not present

## 2020-01-31 DIAGNOSIS — N184 Chronic kidney disease, stage 4 (severe): Secondary | ICD-10-CM | POA: Diagnosis not present

## 2020-01-31 DIAGNOSIS — N2581 Secondary hyperparathyroidism of renal origin: Secondary | ICD-10-CM | POA: Diagnosis not present

## 2020-01-31 DIAGNOSIS — N186 End stage renal disease: Secondary | ICD-10-CM | POA: Diagnosis not present

## 2020-01-31 DIAGNOSIS — Z992 Dependence on renal dialysis: Secondary | ICD-10-CM | POA: Diagnosis not present

## 2020-01-31 DIAGNOSIS — D631 Anemia in chronic kidney disease: Secondary | ICD-10-CM | POA: Diagnosis not present

## 2020-02-02 DIAGNOSIS — N184 Chronic kidney disease, stage 4 (severe): Secondary | ICD-10-CM | POA: Diagnosis not present

## 2020-02-02 DIAGNOSIS — N186 End stage renal disease: Secondary | ICD-10-CM | POA: Diagnosis not present

## 2020-02-02 DIAGNOSIS — D631 Anemia in chronic kidney disease: Secondary | ICD-10-CM | POA: Diagnosis not present

## 2020-02-02 DIAGNOSIS — N2581 Secondary hyperparathyroidism of renal origin: Secondary | ICD-10-CM | POA: Diagnosis not present

## 2020-02-02 DIAGNOSIS — Z992 Dependence on renal dialysis: Secondary | ICD-10-CM | POA: Diagnosis not present

## 2020-02-05 DIAGNOSIS — N184 Chronic kidney disease, stage 4 (severe): Secondary | ICD-10-CM | POA: Diagnosis not present

## 2020-02-05 DIAGNOSIS — N2889 Other specified disorders of kidney and ureter: Secondary | ICD-10-CM | POA: Diagnosis not present

## 2020-02-05 DIAGNOSIS — Z992 Dependence on renal dialysis: Secondary | ICD-10-CM | POA: Diagnosis not present

## 2020-02-05 DIAGNOSIS — D631 Anemia in chronic kidney disease: Secondary | ICD-10-CM | POA: Diagnosis not present

## 2020-02-05 DIAGNOSIS — N186 End stage renal disease: Secondary | ICD-10-CM | POA: Diagnosis not present

## 2020-02-05 DIAGNOSIS — N2581 Secondary hyperparathyroidism of renal origin: Secondary | ICD-10-CM | POA: Diagnosis not present

## 2020-02-05 DIAGNOSIS — D509 Iron deficiency anemia, unspecified: Secondary | ICD-10-CM | POA: Diagnosis not present

## 2020-02-07 DIAGNOSIS — N2581 Secondary hyperparathyroidism of renal origin: Secondary | ICD-10-CM | POA: Diagnosis not present

## 2020-02-07 DIAGNOSIS — D509 Iron deficiency anemia, unspecified: Secondary | ICD-10-CM | POA: Diagnosis not present

## 2020-02-07 DIAGNOSIS — Z992 Dependence on renal dialysis: Secondary | ICD-10-CM | POA: Diagnosis not present

## 2020-02-07 DIAGNOSIS — D631 Anemia in chronic kidney disease: Secondary | ICD-10-CM | POA: Diagnosis not present

## 2020-02-07 DIAGNOSIS — N184 Chronic kidney disease, stage 4 (severe): Secondary | ICD-10-CM | POA: Diagnosis not present

## 2020-02-07 DIAGNOSIS — N186 End stage renal disease: Secondary | ICD-10-CM | POA: Diagnosis not present

## 2020-02-08 ENCOUNTER — Other Ambulatory Visit: Payer: Self-pay | Admitting: Physician Assistant

## 2020-02-09 DIAGNOSIS — D631 Anemia in chronic kidney disease: Secondary | ICD-10-CM | POA: Diagnosis not present

## 2020-02-09 DIAGNOSIS — N184 Chronic kidney disease, stage 4 (severe): Secondary | ICD-10-CM | POA: Diagnosis not present

## 2020-02-09 DIAGNOSIS — Z992 Dependence on renal dialysis: Secondary | ICD-10-CM | POA: Diagnosis not present

## 2020-02-09 DIAGNOSIS — N2581 Secondary hyperparathyroidism of renal origin: Secondary | ICD-10-CM | POA: Diagnosis not present

## 2020-02-09 DIAGNOSIS — D509 Iron deficiency anemia, unspecified: Secondary | ICD-10-CM | POA: Diagnosis not present

## 2020-02-09 DIAGNOSIS — N186 End stage renal disease: Secondary | ICD-10-CM | POA: Diagnosis not present

## 2020-02-12 DIAGNOSIS — D631 Anemia in chronic kidney disease: Secondary | ICD-10-CM | POA: Diagnosis not present

## 2020-02-12 DIAGNOSIS — N186 End stage renal disease: Secondary | ICD-10-CM | POA: Diagnosis not present

## 2020-02-12 DIAGNOSIS — Z992 Dependence on renal dialysis: Secondary | ICD-10-CM | POA: Diagnosis not present

## 2020-02-12 DIAGNOSIS — N184 Chronic kidney disease, stage 4 (severe): Secondary | ICD-10-CM | POA: Diagnosis not present

## 2020-02-12 DIAGNOSIS — N2581 Secondary hyperparathyroidism of renal origin: Secondary | ICD-10-CM | POA: Diagnosis not present

## 2020-02-12 DIAGNOSIS — D509 Iron deficiency anemia, unspecified: Secondary | ICD-10-CM | POA: Diagnosis not present

## 2020-02-14 DIAGNOSIS — D631 Anemia in chronic kidney disease: Secondary | ICD-10-CM | POA: Diagnosis not present

## 2020-02-14 DIAGNOSIS — D509 Iron deficiency anemia, unspecified: Secondary | ICD-10-CM | POA: Diagnosis not present

## 2020-02-14 DIAGNOSIS — N186 End stage renal disease: Secondary | ICD-10-CM | POA: Diagnosis not present

## 2020-02-14 DIAGNOSIS — N2581 Secondary hyperparathyroidism of renal origin: Secondary | ICD-10-CM | POA: Diagnosis not present

## 2020-02-14 DIAGNOSIS — Z992 Dependence on renal dialysis: Secondary | ICD-10-CM | POA: Diagnosis not present

## 2020-02-14 DIAGNOSIS — N184 Chronic kidney disease, stage 4 (severe): Secondary | ICD-10-CM | POA: Diagnosis not present

## 2020-02-16 DIAGNOSIS — D509 Iron deficiency anemia, unspecified: Secondary | ICD-10-CM | POA: Diagnosis not present

## 2020-02-16 DIAGNOSIS — N184 Chronic kidney disease, stage 4 (severe): Secondary | ICD-10-CM | POA: Diagnosis not present

## 2020-02-16 DIAGNOSIS — N186 End stage renal disease: Secondary | ICD-10-CM | POA: Diagnosis not present

## 2020-02-16 DIAGNOSIS — D631 Anemia in chronic kidney disease: Secondary | ICD-10-CM | POA: Diagnosis not present

## 2020-02-16 DIAGNOSIS — Z992 Dependence on renal dialysis: Secondary | ICD-10-CM | POA: Diagnosis not present

## 2020-02-16 DIAGNOSIS — N2581 Secondary hyperparathyroidism of renal origin: Secondary | ICD-10-CM | POA: Diagnosis not present

## 2020-02-19 DIAGNOSIS — D509 Iron deficiency anemia, unspecified: Secondary | ICD-10-CM | POA: Diagnosis not present

## 2020-02-19 DIAGNOSIS — Z992 Dependence on renal dialysis: Secondary | ICD-10-CM | POA: Diagnosis not present

## 2020-02-19 DIAGNOSIS — N184 Chronic kidney disease, stage 4 (severe): Secondary | ICD-10-CM | POA: Diagnosis not present

## 2020-02-19 DIAGNOSIS — D631 Anemia in chronic kidney disease: Secondary | ICD-10-CM | POA: Diagnosis not present

## 2020-02-19 DIAGNOSIS — N2581 Secondary hyperparathyroidism of renal origin: Secondary | ICD-10-CM | POA: Diagnosis not present

## 2020-02-19 DIAGNOSIS — N186 End stage renal disease: Secondary | ICD-10-CM | POA: Diagnosis not present

## 2020-02-20 ENCOUNTER — Encounter: Payer: Self-pay | Admitting: Internal Medicine

## 2020-02-21 DIAGNOSIS — N186 End stage renal disease: Secondary | ICD-10-CM | POA: Diagnosis not present

## 2020-02-21 DIAGNOSIS — N184 Chronic kidney disease, stage 4 (severe): Secondary | ICD-10-CM | POA: Diagnosis not present

## 2020-02-21 DIAGNOSIS — D509 Iron deficiency anemia, unspecified: Secondary | ICD-10-CM | POA: Diagnosis not present

## 2020-02-21 DIAGNOSIS — D631 Anemia in chronic kidney disease: Secondary | ICD-10-CM | POA: Diagnosis not present

## 2020-02-21 DIAGNOSIS — N2581 Secondary hyperparathyroidism of renal origin: Secondary | ICD-10-CM | POA: Diagnosis not present

## 2020-02-21 DIAGNOSIS — Z992 Dependence on renal dialysis: Secondary | ICD-10-CM | POA: Diagnosis not present

## 2020-02-23 DIAGNOSIS — N184 Chronic kidney disease, stage 4 (severe): Secondary | ICD-10-CM | POA: Diagnosis not present

## 2020-02-23 DIAGNOSIS — D509 Iron deficiency anemia, unspecified: Secondary | ICD-10-CM | POA: Diagnosis not present

## 2020-02-23 DIAGNOSIS — Z992 Dependence on renal dialysis: Secondary | ICD-10-CM | POA: Diagnosis not present

## 2020-02-23 DIAGNOSIS — N2581 Secondary hyperparathyroidism of renal origin: Secondary | ICD-10-CM | POA: Diagnosis not present

## 2020-02-23 DIAGNOSIS — D631 Anemia in chronic kidney disease: Secondary | ICD-10-CM | POA: Diagnosis not present

## 2020-02-23 DIAGNOSIS — N186 End stage renal disease: Secondary | ICD-10-CM | POA: Diagnosis not present

## 2020-02-25 ENCOUNTER — Encounter: Payer: Self-pay | Admitting: Internal Medicine

## 2020-02-26 DIAGNOSIS — N186 End stage renal disease: Secondary | ICD-10-CM | POA: Diagnosis not present

## 2020-02-26 DIAGNOSIS — D509 Iron deficiency anemia, unspecified: Secondary | ICD-10-CM | POA: Diagnosis not present

## 2020-02-26 DIAGNOSIS — Z992 Dependence on renal dialysis: Secondary | ICD-10-CM | POA: Diagnosis not present

## 2020-02-26 DIAGNOSIS — N184 Chronic kidney disease, stage 4 (severe): Secondary | ICD-10-CM | POA: Diagnosis not present

## 2020-02-26 DIAGNOSIS — N2581 Secondary hyperparathyroidism of renal origin: Secondary | ICD-10-CM | POA: Diagnosis not present

## 2020-02-26 DIAGNOSIS — D631 Anemia in chronic kidney disease: Secondary | ICD-10-CM | POA: Diagnosis not present

## 2020-02-28 DIAGNOSIS — N2581 Secondary hyperparathyroidism of renal origin: Secondary | ICD-10-CM | POA: Diagnosis not present

## 2020-02-28 DIAGNOSIS — D509 Iron deficiency anemia, unspecified: Secondary | ICD-10-CM | POA: Diagnosis not present

## 2020-02-28 DIAGNOSIS — D631 Anemia in chronic kidney disease: Secondary | ICD-10-CM | POA: Diagnosis not present

## 2020-02-28 DIAGNOSIS — N186 End stage renal disease: Secondary | ICD-10-CM | POA: Diagnosis not present

## 2020-02-28 DIAGNOSIS — N184 Chronic kidney disease, stage 4 (severe): Secondary | ICD-10-CM | POA: Diagnosis not present

## 2020-02-28 DIAGNOSIS — Z992 Dependence on renal dialysis: Secondary | ICD-10-CM | POA: Diagnosis not present

## 2020-03-01 DIAGNOSIS — D631 Anemia in chronic kidney disease: Secondary | ICD-10-CM | POA: Diagnosis not present

## 2020-03-01 DIAGNOSIS — N186 End stage renal disease: Secondary | ICD-10-CM | POA: Diagnosis not present

## 2020-03-01 DIAGNOSIS — N184 Chronic kidney disease, stage 4 (severe): Secondary | ICD-10-CM | POA: Diagnosis not present

## 2020-03-01 DIAGNOSIS — D509 Iron deficiency anemia, unspecified: Secondary | ICD-10-CM | POA: Diagnosis not present

## 2020-03-01 DIAGNOSIS — Z992 Dependence on renal dialysis: Secondary | ICD-10-CM | POA: Diagnosis not present

## 2020-03-01 DIAGNOSIS — N2581 Secondary hyperparathyroidism of renal origin: Secondary | ICD-10-CM | POA: Diagnosis not present

## 2020-03-04 DIAGNOSIS — N2581 Secondary hyperparathyroidism of renal origin: Secondary | ICD-10-CM | POA: Diagnosis not present

## 2020-03-04 DIAGNOSIS — D631 Anemia in chronic kidney disease: Secondary | ICD-10-CM | POA: Diagnosis not present

## 2020-03-04 DIAGNOSIS — N186 End stage renal disease: Secondary | ICD-10-CM | POA: Diagnosis not present

## 2020-03-04 DIAGNOSIS — N184 Chronic kidney disease, stage 4 (severe): Secondary | ICD-10-CM | POA: Diagnosis not present

## 2020-03-04 DIAGNOSIS — N2889 Other specified disorders of kidney and ureter: Secondary | ICD-10-CM | POA: Diagnosis not present

## 2020-03-04 DIAGNOSIS — Z992 Dependence on renal dialysis: Secondary | ICD-10-CM | POA: Diagnosis not present

## 2020-03-06 DIAGNOSIS — Z992 Dependence on renal dialysis: Secondary | ICD-10-CM | POA: Diagnosis not present

## 2020-03-06 DIAGNOSIS — N184 Chronic kidney disease, stage 4 (severe): Secondary | ICD-10-CM | POA: Diagnosis not present

## 2020-03-06 DIAGNOSIS — N2581 Secondary hyperparathyroidism of renal origin: Secondary | ICD-10-CM | POA: Diagnosis not present

## 2020-03-06 DIAGNOSIS — N186 End stage renal disease: Secondary | ICD-10-CM | POA: Diagnosis not present

## 2020-03-06 DIAGNOSIS — D631 Anemia in chronic kidney disease: Secondary | ICD-10-CM | POA: Diagnosis not present

## 2020-03-08 DIAGNOSIS — N2581 Secondary hyperparathyroidism of renal origin: Secondary | ICD-10-CM | POA: Diagnosis not present

## 2020-03-08 DIAGNOSIS — N186 End stage renal disease: Secondary | ICD-10-CM | POA: Diagnosis not present

## 2020-03-08 DIAGNOSIS — D631 Anemia in chronic kidney disease: Secondary | ICD-10-CM | POA: Diagnosis not present

## 2020-03-08 DIAGNOSIS — Z992 Dependence on renal dialysis: Secondary | ICD-10-CM | POA: Diagnosis not present

## 2020-03-08 DIAGNOSIS — N184 Chronic kidney disease, stage 4 (severe): Secondary | ICD-10-CM | POA: Diagnosis not present

## 2020-03-11 DIAGNOSIS — Z992 Dependence on renal dialysis: Secondary | ICD-10-CM | POA: Diagnosis not present

## 2020-03-11 DIAGNOSIS — D631 Anemia in chronic kidney disease: Secondary | ICD-10-CM | POA: Diagnosis not present

## 2020-03-11 DIAGNOSIS — N2581 Secondary hyperparathyroidism of renal origin: Secondary | ICD-10-CM | POA: Diagnosis not present

## 2020-03-11 DIAGNOSIS — N184 Chronic kidney disease, stage 4 (severe): Secondary | ICD-10-CM | POA: Diagnosis not present

## 2020-03-11 DIAGNOSIS — N186 End stage renal disease: Secondary | ICD-10-CM | POA: Diagnosis not present

## 2020-03-13 DIAGNOSIS — N186 End stage renal disease: Secondary | ICD-10-CM | POA: Diagnosis not present

## 2020-03-13 DIAGNOSIS — N184 Chronic kidney disease, stage 4 (severe): Secondary | ICD-10-CM | POA: Diagnosis not present

## 2020-03-13 DIAGNOSIS — N2581 Secondary hyperparathyroidism of renal origin: Secondary | ICD-10-CM | POA: Diagnosis not present

## 2020-03-13 DIAGNOSIS — Z992 Dependence on renal dialysis: Secondary | ICD-10-CM | POA: Diagnosis not present

## 2020-03-13 DIAGNOSIS — D631 Anemia in chronic kidney disease: Secondary | ICD-10-CM | POA: Diagnosis not present

## 2020-03-15 DIAGNOSIS — Z992 Dependence on renal dialysis: Secondary | ICD-10-CM | POA: Diagnosis not present

## 2020-03-15 DIAGNOSIS — N186 End stage renal disease: Secondary | ICD-10-CM | POA: Diagnosis not present

## 2020-03-15 DIAGNOSIS — D631 Anemia in chronic kidney disease: Secondary | ICD-10-CM | POA: Diagnosis not present

## 2020-03-15 DIAGNOSIS — N2581 Secondary hyperparathyroidism of renal origin: Secondary | ICD-10-CM | POA: Diagnosis not present

## 2020-03-15 DIAGNOSIS — N184 Chronic kidney disease, stage 4 (severe): Secondary | ICD-10-CM | POA: Diagnosis not present

## 2020-03-18 DIAGNOSIS — Z992 Dependence on renal dialysis: Secondary | ICD-10-CM | POA: Diagnosis not present

## 2020-03-18 DIAGNOSIS — N184 Chronic kidney disease, stage 4 (severe): Secondary | ICD-10-CM | POA: Diagnosis not present

## 2020-03-18 DIAGNOSIS — N186 End stage renal disease: Secondary | ICD-10-CM | POA: Diagnosis not present

## 2020-03-18 DIAGNOSIS — N2581 Secondary hyperparathyroidism of renal origin: Secondary | ICD-10-CM | POA: Diagnosis not present

## 2020-03-18 DIAGNOSIS — D631 Anemia in chronic kidney disease: Secondary | ICD-10-CM | POA: Diagnosis not present

## 2020-03-20 DIAGNOSIS — N186 End stage renal disease: Secondary | ICD-10-CM | POA: Diagnosis not present

## 2020-03-20 DIAGNOSIS — D631 Anemia in chronic kidney disease: Secondary | ICD-10-CM | POA: Diagnosis not present

## 2020-03-20 DIAGNOSIS — Z992 Dependence on renal dialysis: Secondary | ICD-10-CM | POA: Diagnosis not present

## 2020-03-20 DIAGNOSIS — N184 Chronic kidney disease, stage 4 (severe): Secondary | ICD-10-CM | POA: Diagnosis not present

## 2020-03-20 DIAGNOSIS — N2581 Secondary hyperparathyroidism of renal origin: Secondary | ICD-10-CM | POA: Diagnosis not present

## 2020-03-22 DIAGNOSIS — N2581 Secondary hyperparathyroidism of renal origin: Secondary | ICD-10-CM | POA: Diagnosis not present

## 2020-03-22 DIAGNOSIS — N186 End stage renal disease: Secondary | ICD-10-CM | POA: Diagnosis not present

## 2020-03-22 DIAGNOSIS — D631 Anemia in chronic kidney disease: Secondary | ICD-10-CM | POA: Diagnosis not present

## 2020-03-22 DIAGNOSIS — N184 Chronic kidney disease, stage 4 (severe): Secondary | ICD-10-CM | POA: Diagnosis not present

## 2020-03-22 DIAGNOSIS — Z992 Dependence on renal dialysis: Secondary | ICD-10-CM | POA: Diagnosis not present

## 2020-03-24 ENCOUNTER — Encounter: Payer: Self-pay | Admitting: Internal Medicine

## 2020-03-25 ENCOUNTER — Ambulatory Visit (INDEPENDENT_AMBULATORY_CARE_PROVIDER_SITE_OTHER): Payer: Medicare Other | Admitting: Nurse Practitioner

## 2020-03-25 ENCOUNTER — Telehealth: Payer: Self-pay

## 2020-03-25 ENCOUNTER — Encounter: Payer: Self-pay | Admitting: Nurse Practitioner

## 2020-03-25 ENCOUNTER — Other Ambulatory Visit: Payer: Self-pay

## 2020-03-25 DIAGNOSIS — Z0000 Encounter for general adult medical examination without abnormal findings: Secondary | ICD-10-CM

## 2020-03-25 DIAGNOSIS — Z992 Dependence on renal dialysis: Secondary | ICD-10-CM | POA: Diagnosis not present

## 2020-03-25 DIAGNOSIS — N186 End stage renal disease: Secondary | ICD-10-CM | POA: Diagnosis not present

## 2020-03-25 DIAGNOSIS — D631 Anemia in chronic kidney disease: Secondary | ICD-10-CM | POA: Diagnosis not present

## 2020-03-25 DIAGNOSIS — N184 Chronic kidney disease, stage 4 (severe): Secondary | ICD-10-CM | POA: Diagnosis not present

## 2020-03-25 DIAGNOSIS — N2581 Secondary hyperparathyroidism of renal origin: Secondary | ICD-10-CM | POA: Diagnosis not present

## 2020-03-25 NOTE — Progress Notes (Signed)
This service is provided via telemedicine  No vital signs collected/recorded due to the encounter was a telemedicine visit.   Location of patient (ex: home, work):  Home  Patient consents to a telephone visit:  Yes, see encounter dated 03/25/2020  Location of the provider (ex: office, home): [place]  Name of any referring provider:  Hollace Kinnier, DO  Names of all persons participating in the telemedicine service and their role in the encounter: Sherrie Mustache, Nurse Practitioner, Carroll Kinds, CMA, and patient.   Time spent on call:  15 minutes with medical assistant

## 2020-03-25 NOTE — Progress Notes (Signed)
Subjective:   Eric [name] is a 87 y.o. male who presents for Medicare Annual/Subsequent preventive examination.  Review of Systems     Cardiac Risk Factors include: advanced age (>22mn, >>13women);male gender;sedentary lifestyle     Objective:    There were no vitals filed for this visit. There is no height or weight on file to calculate BMI.  Advanced Directives 03/25/2020 12/05/2019 12/03/2019 11/30/2019 10/17/2019 08/29/2019 06/13/2019  Does Patient Have a Medical Advance Directive? Yes Yes Yes Yes Yes Yes Yes  Type of Advance Directive Out of facility DNR (pink MOST or yellow form) [name]  Does patient want to make changes to medical advance directive? No - Patient declined No - Patient declined No - Patient declined No - Patient declined No - Patient declined - No - Patient declined  Copy of HOsinoin Chart? - Yes - validated most recent copy scanned in chart (See row information) Yes - validated most recent copy scanned in chart (See row information) - - No - copy requested -  Would patient like information on creating a medical advance directive? - - - - - - -  Pre-existing out of facility DNR order (yellow form or pink MOST form) Yellow form placed in chart (order not valid for inpatient use) Pink MOST/Yellow Form most recent copy in chart - Physician notified to receive inpatient order - Pink MOST/Yellow Form most recent copy in chart - Physician notified to receive inpatient order - - -    Current Medications (verified) Outpatient Encounter Medications as of 03/25/2020  Medication Sig  . acetaminophen (TYLENOL) 500 MG tablet Take 1,000 mg  by mouth 3 (three) times daily as needed for moderate pain.   .Marland Kitchenallopurinol (ZYLOPRIM) 100 MG tablet TAKE 1 TABLET BY MOUTH DAILY  . atorvastatin (LIPITOR) 20 MG tablet TAKE 1 TABLET BY MOUTH DAILY FOR CHOLESTEROL  . calcium carbonate (TUMS EX) 750 MG chewable tablet Chew 1,500 mg by mouth 3 (three) times daily with meals.  . cinacalcet (SENSIPAR) 60 MG tablet Take 60 mg by mouth daily.   . midodrine (PROAMATINE) 10 MG tablet Take 1 tablet (10 mg total) by mouth 3 (three) times daily. OVERDUE FOR FOLLOW UP. PLEASE CALL AND SCHEDULE - 1ST ATTEMPT  . omeprazole (PRILOSEC) 20 MG capsule Take 20 mg by mouth daily.    No facility-administered encounter medications on file as of 03/25/2020.    Allergies (verified) Patient has no known allergies.   History: Past Medical History:  Diagnosis Date  . Anxiety   . Arthritis   . CHF (congestive heart failure) (HCC)    chronic systolic CHF  . Complication of anesthesia    " one time my heart stopped due to a medication that I was on."   . Coronary artery disease   . ESRD (end stage renal disease) on dialysis ([place]    Tues, Thursday, Saturday[place] [facility]; [place]  . GERD (gastroesophageal reflux disease)   . Gout   . Heart murmur    as a teen  . Herpes zoster 04/2012  . Hypertension    08/28/19- now has hypotension  . Left bundle branch  block   . Pneumonia 10/2018  . Shortness of breath dyspnea    with exertion  "walking, probably due to my heart not eorking well"   Past Surgical History:  Procedure Laterality Date  . AORTIC VALVE REPLACEMENT N/A 12/09/2014   Procedure: AORTIC VALVE REPLACEMENT (AVR) WITH 23MM MAGNA EASE BIOPROSTHETIC VALVE;  Surgeon: Gaye Pollack, MD;  Location: [facility] OR;  Service: Open Heart Surgery;  Laterality: N/A;  . APPENDECTOMY  1944  . AV FISTULA PLACEMENT Left    Left Cim[place] AVF placed in [place]   . AV FISTULA PLACEMENT Right 03/06/2014   Procedure: ARTERIOVENOUS (AV) FISTULA  CREATION-right radiocephalic;  Surgeon: Mal Misty, MD;  [facility] OR;  Service: Vascular;  Laterality: Right;  . AV FISTULA PLACEMENT Right 07/15/2014   Procedure: ARTERIOVENOUS (AV) FISTULA CREATION;  Surgeon: Elam Dutch, MD;  Location: [place];  Service: Vascular;  Laterality: Right;  . CARDIAC CATHETERIZATION N/A 11/27/2014   Procedure: Right/Left Heart Cath and Coronary Angiography;  Surgeon: Burnell Blanks, MD;  Location: [facility];  Service: Cardiovascular;  Laterality: N/A;  . CATARACT EXTRACTION W/ INTRAOCULAR LENS IMPLANT Bilateral 2014   [facility]  . COLONOSCOPY  2013   Dr. Annamaria Helling [place], [place].  . CORONARY ARTERY BYPASS GRAFT N/A 12/09/2014   Procedure: CORONARY ARTERY BYPASS GRAFTING (CABG), ON PUMP, TIMES THREE, USING LEFT INTERNAL MAMMARY ARTERY, RIGHT GREATER SAPHENOUS VEIN HARVESTED ENDOSCOPICALLY;  Surgeon: Gaye Pollack, MD;  Location: [place];  Service: Open Heart Surgery;  Laterality: N/A;  LIMA-LAD; SVG-OM; SVG-PD  . DIALYSIS FISTULA CREATION  08/04/2012 and 10/13   Dr. Harden Mo  . FISTULA SUPERFICIALIZATION Right 08/29/2019   Procedure: RIGHT ARTERIOVENOUS FISTULA  PLICATION;  Surgeon: Serafina Mitchell, MD;  [facility]: [facility];  Service: Vascular;  Laterality: Right;  . FISTULOGRAM Right 04/12/2019   Procedure: FISTULOGRAM with Balloon Venoplasty of Right Peripheral Vein;  Surgeon: Serafina Mitchell, MD;  Location: [place];  Service: Vascular;  Laterality: Right;  . FISTULOGRAM Right 08/29/2019   Procedure: FISTULOGRAM;  Surgeon: Serafina Mitchell, MD;  Location: [facility];  Service: Vascular;  Laterality: Right;  . INSERTION OF DIALYSIS CATHETER Right 01-15-14   Right chest TDC placed by Dr. Augustin Coupe at [facility]  . LIGATION OF ARTERIOVENOUS  FISTULA Right 07/15/2014   Procedure: LIGATION OF ARTERIOVENOUS  FISTULA  (RIGHT RADIOCEPHALIC);  Surgeon: Elam Dutch, MD;  Location: [place];  Service: Vascular;  Laterality: Right;  . LIGATION OF  COMPETING BRANCHES OF ARTERIOVENOUS FISTULA Right 05/08/2014   Procedure: LIGATION OF COMPETING BRANCHES OF RIGHT ARM RADIOCEPHALIC ARTERIOVENOUS FISTULA;  Surgeon: Mal Misty, MD;  Location: [facility];  Service: Vascular;  Laterality: Right;  . RESECTION OF ARTERIOVENOUS FISTULA ANEURYSM Right 08/29/2019   Procedure: RESECTION OF ARTERIOVENOUS FISTULA ANEURYSM;  Surgeon: Serafina Mitchell, MD;  Location: [facility];  Service: Vascular;  Laterality: Right;  . REVISON OF ARTERIOVENOUS FISTULA Right 07/15/2014   Procedure: EXPLORATION OF ARTERIOVENOUS FISTULA;  Surgeon: Elam Dutch, MD;  Location: [facility];  Service: Vascular;  Laterality: Right;  . REVISON OF ARTERIOVENOUS FISTULA Right 04/12/2019   Procedure: REVISON OF ARTERIOVENOUS FISTULA;  Surgeon: Serafina Mitchell, MD;  Location: [facility];  Service: Vascular;  Laterality: Right;  . TEE WITHOUT CARDIOVERSION N/A 12/09/2014   Procedure: TRANSESOPHAGEAL ECHOCARDIOGRAM (TEE);  Surgeon: Gaye Pollack, MD;  Location: [facility];  Service: Open Heart Surgery;  Laterality: N/A;  . VENOPLASTY Right 08/29/2019  Procedure: PERIPHERAL VENOPLASTY;  Surgeon: Serafina Mitchell, MD;  Location: [facility] OR;  Service: Vascular;  Laterality: Right;   Family History  Problem Relation Age of Onset  . Diabetes Father   . Lung cancer Father 63       non-smoker  . Cancer Mother        multiple myeloma  . Colon cancer Neg Hx    Social History   Socioeconomic History  . Marital status: Married    Spouse name: Jan  . Number of children: 3  . Years of education: 69  . Highest education level: Not on file  Occupational History  . Occupation: retired Designer, fashion/clothing  Tobacco Use  . Smoking status: Never Smoker  . Smokeless tobacco: Never Used  Vaping Use  . Vaping Use: Never used  Substance and Sexual Activity  . Alcohol use: Yes    Alcohol/week: 10.0 standard drinks    Types: 7 Standard drinks or equivalent, 3 Glasses of wine per week  . Drug use: No  . Sexual activity: Not  on file  Other Topics Concern  . Not on file  Social History Narrative   Patient is Married since 1958. Occupation: Wellsite geologist   Lives in apartment,  Independent Living  section at [place] since 05/04/2013. Also has a home in [place], [place].   No Smoking history  Alcohol history: 5 drinks/ week   Regular exercise: 3-4 times a week , treadmill   Patient has Advanced planning documents: Living Will, HCPOA         Social Determinants of Health   Financial Resource Strain: Not on file  Food Insecurity: Not on file  Transportation Needs: Not on file  Physical Activity: Not on file  Stress: Not on file  Social Connections: Not on file    Tobacco Counseling Counseling given: Not Answered   Clinical Intake:  Pre-visit preparation completed: Yes  Pain : No/denies pain     BMI - recorded: 23.99 Nutritional Status: BMI of 19-24  Normal Nutritional Risks: None Diabetes: No  How often do you need to have someone help you when you read instructions, pamphlets, or other written materials from your doctor or pharmacy?: 1 - Never  Diabetic?no         Activities of Daily Living In your present state of health, do you have any difficulty performing the following activities: 03/25/2020 08/29/2019  Hearing? N -  Vision? N -  Difficulty concentrating or making decisions? N -  Walking or climbing stairs? N -  Dressing or bathing? Y -  Doing errands, shopping? N [place] and eating ? N -  Using the Toilet? N -  In the past six months, have you accidently leaked urine? N -  Do you have problems with loss of bowel control? N -  Managing your Medications? N -  Managing your Finances? N -  Comment son manages -  Housekeeping or managing your Housekeeping? N -  Some recent data might be hidden    Patient Care Team: Gayland Curry, DO as PCP - General (Geriatric Medicine) Jamal Maes, MD as Consulting Physician  (Nephrology) Burtis Junes, NP as Nurse Practitioner (Nurse Practitioner) [facility] [place] any recent Medical Services you may have received from other than [facility] providers in the past year (date may be approximate).     Assessment:   This is a routine wellness examination for [employer].  Hearing/Vision screen  Hearing Screening   125Hz  250Hz  500Hz  1000Hz  2000Hz  3000Hz  4000Hz  6000Hz  8000Hz   Right ear:           Left ear:           Comments: Patient has no hearing problems  Vision Screening Comments: Patient has no vision problems. Patient had last eye exam fall 2021. [facility] to [facility]  Dietary issues and exercise activities discussed: Current Exercise Habits: The patient does not participate in regular exercise at present, Exercise limited by: cardiac condition(s)  Goals   None    Depression Screen PHQ 2/9 Scores 03/25/2020 12/05/2019 06/13/2019 03/19/2019 01/24/2019 11/08/2018 07/19/2018  PHQ - 2 Score 0 0 0 0 0 0 0    Fall Risk Fall Risk  03/25/2020 12/05/2019 12/03/2019 10/17/2019 06/13/2019  Falls in the past year? 1 0 1 1 1   Number falls in past yr: 1 0 0 0 0  Injury with Fall? 0 0 0 0 0  Risk for fall due to : - - - - -    FALL RISK PREVENTION PERTAINING TO THE HOME:  Any stairs in or around the home? No  If so, are there any without handrails? No  Home free of loose throw rugs in walkways, pet beds, electrical cords, etc? Yes  Adequate lighting in your home to reduce risk of falls? Yes   ASSISTIVE DEVICES UTILIZED TO PREVENT FALLS:  Life alert? no Use of a cane, walker or w/c? No  Grab bars in the bathroom? Yes  Shower chair or bench in shower? No  Elevated toilet seat or a handicapped toilet? Yes   TIMED UP AND GO:  Was the test performed? No .    Cognitive Function: MMSE - Mini Mental State Exam 06/15/2017 05/12/2016 04/30/2015  Orientation to time 5 5 5   Orientation to Place 5 5 5   Registration 3 3 3   Attention/  Calculation 5 5 5   Recall 1 3 3   Language- name 2 objects 2 2 2   Language- repeat 1 1 1   Language- follow 3 step command 3 3 3   Language- read & follow direction 1 1 1   Write a sentence 1 1 1   Copy design 1 1 1   Total score 28 30 30      6CIT Screen 03/25/2020 03/19/2019  What Year? 0 points 0 points  What month? 0 points 0 points  What time? 0 points 0 points  Count back from 20 0 points 0 points  Months in reverse 0 points 0 points  Repeat phrase 0 points 0 points  Total Score 0 0    Immunizations Immunization History  Administered Date(s) Administered  . Hepatitis B, adult 06/02/2013, 07/03/2013, 08/02/2013  . Influenza, High Dose Seasonal PF 10/02/2017, 09/21/2018, 11/02/2019  . Influenza-Unspecified 10/04/2012, 10/05/2015, 10/04/2016, 09/05/2018  . Moderna Sars-Covid-2 Vaccination 01/06/2019, 02/13/2019, 11/20/2019  . Pneumococcal Conjugate-13 04/30/2015  . Pneumococcal Polysaccharide-23 06/04/2008  . Tdap 06/01/2017  . Zoster 01/05/2011    TDAP status: Up to date  Flu Vaccine status: Up to date  Pneumococcal vaccine status: Up to date  Covid-19 vaccine status: Completed vaccines  Qualifies for Shingles Vaccine? yes  Zostavax completed Yes   Shingrix Completed?: No.    Education has been provided regarding the importance of this vaccine. Patient has been advised to call insurance company to determine out of pocket expense if they have not yet received this vaccine. Advised may also receive vaccine at local pharmacy or Health Dept. Verbalized acceptance and understanding.  Screening Tests Health Maintenance  Topic Date Due  . TETANUS/TDAP  06/02/2027  . INFLUENZA VACCINE  Completed  . COVID-19 Vaccine  Completed  . PNA vac Low Risk Adult  Completed  . HPV VACCINES  Aged Out    Health Maintenance  There are no preventive care reminders to display for this patient.  Colorectal cancer screening: No longer required.   Lung Cancer Screening: (Low Dose CT  Chest recommended if Age 6-80 years, 30 pack-year currently smoking OR have quit w/in 15years.) does not qualify.   Additional Screening:  Hepatitis C Screening: does not qualify  Vision Screening: Recommended annual ophthalmology exams for early detection of glaucoma and other disorders of the eye. Is the patient up to date with their annual eye exam?  Yes  Who is the provider or what is the name of the office in which the patient attends annual eye exams? [facility] If pt is not established with a provider, would they like to be referred to a provider to establish care? No .   Dental Screening: Recommended annual dental exams for proper oral hygiene  Community Resource Referral / Chronic Care Management: CRR required this visit?  No   CCM required this visit?  No      Plan:     I have personally reviewed and noted the following in the patient's chart:   . Medical and social history . Use of alcohol, tobacco or illicit drugs  . Current medications and supplements . Functional ability and status . Nutritional status . Physical activity . Advanced directives . List of other physicians . Hospitalizations, surgeries, and ER visits in previous 12 months . Vitals . Screenings to include cognitive, depression, and falls . Referrals and appointments  In addition, I have reviewed and discussed with patient certain preventive protocols, quality metrics, and best practice recommendations. A written personalized care plan for preventive services as well as general preventive health recommendations were provided to patient.     Lauree Chandler, NP   03/25/2020    Virtual Visit via Telephone Note  I connected with pt on 03/25/20 at  1:30 PM EDT by telephone and verified that I am speaking with the correct person using two identifiers.  Location: Patient: home Provider: [facility]    I discussed the limitations, risks, security and privacy concerns of performing an  evaluation and management service by telephone and the availability of in person appointments. I also discussed with the patient that there may be a patient responsible charge related to this service. The patient expressed understanding and agreed to proceed.   I discussed the assessment and treatment plan with the patient. The patient was provided an opportunity to ask questions and all were answered. The patient agreed with the plan and demonstrated an understanding of the instructions.   The patient was advised to call back or seek an in-person evaluation if the symptoms worsen or if the condition fails to improve as anticipated.  I provided 18 minutes of non-face-to-face time during this encounter.  Carlos American. Harle Battiest Avs printed and mailed

## 2020-03-25 NOTE — Telephone Encounter (Signed)
[name], eustice are scheduled for a virtual visit with your provider today.    Just as we do with appointments in the office, we must obtain your consent to participate.  Your consent will be active for this visit and any virtual visit you may have with one of our providers in the next 365 days.    If you have a MyChart account, I can also send a copy of this consent to you electronically.  All virtual visits are billed to your insurance company just like a traditional visit in the office.  As this is a virtual visit, video technology does not allow for your provider to perform a traditional examination.  This may limit your provider's ability to fully assess your condition.  If your provider identifies any concerns that need to be evaluated in person or the need to arrange testing such as labs, EKG, etc, we will make arrangements to do so.    Although advances in technology are sophisticated, we cannot ensure that it will always work on either your end or our end.  If the connection with a video visit is poor, we may have to switch to a telephone visit.  With either a video or telephone visit, we are not always able to ensure that we have a secure connection.   I need to obtain your verbal consent now.   Are you willing to proceed with your visit today?   Eric [name] has provided verbal consent on 03/25/2020 for a virtual visit (video or telephone).   Carroll Kinds, CMA 03/25/2020  1:27 PM

## 2020-03-25 NOTE — Patient Instructions (Signed)
Mr. Eric [name] , Thank you for taking time to come for your Medicare Wellness Visit. I appreciate your ongoing commitment to your health goals. Please review the following plan we discussed and let me know if I can assist you in the future.   Screening recommendations/referrals: Colonoscopy aged out Recommended yearly ophthalmology/optometry visit for glaucoma screening and checkup Recommended yearly dental visit for hygiene and checkup  Vaccinations: Influenza vaccine up to date Pneumococcal vaccine up to date Tdap vaccine up to date Shingles vaccine RECOMMENDED to get shingrix at local pharmacy.   Advanced directives: on file.   Conditions/risks identified: complications related to CHF, advanced age.  Next appointment: year for AWV  Preventive Care 85 Years and Older, Male Preventive care refers to lifestyle choices and visits with your health care provider that can promote health and wellness. What does preventive care include?  A yearly physical exam. This is also called an annual well check.  Dental exams once or twice a year.  Routine eye exams. Ask your health care provider how often you should have your eyes checked.  Personal lifestyle choices, including:  Daily care of your teeth and gums.  Regular physical activity.  Eating a healthy diet.  Avoiding tobacco and drug use.  Limiting alcohol use.  Practicing safe sex.  Taking low doses of aspirin every day.  Taking vitamin and mineral supplements as recommended by your health care provider. What happens during an annual well check? The services and screenings done by your health care provider during your annual well check will depend on your age, overall health, lifestyle risk factors, and family history of disease. Counseling  Your health care provider may ask you questions about your:  Alcohol use.  Tobacco use.  Drug use.  Emotional well-being.  Home and relationship well-being.  Sexual  activity.  Eating habits.  History of falls.  Memory and ability to understand (cognition).  Work and work Statistician. Screening  You may have the following tests or measurements:  Height, weight, and BMI.  Blood pressure.  Lipid and cholesterol levels. These may be checked every 5 years, or more frequently if you are over 85 years old.  Skin check.  Lung cancer screening. You may have this screening every year starting at age 85 if you have a 30-pack-year history of smoking and currently smoke or have quit within the past 15 years.  Fecal occult blood test (FOBT) of the stool. You may have this test every year starting at age 85.  Flexible sigmoidoscopy or colonoscopy. You may have a sigmoidoscopy every 5 years or a colonoscopy every 10 years starting at age 85.  Prostate cancer screening. Recommendations will vary depending on your family history and other risks.  Hepatitis C blood test.  Hepatitis B blood test.  Sexually transmitted disease (STD) testing.  Diabetes screening. This is done by checking your blood sugar (glucose) after you have not eaten for a while (fasting). You may have this done every 1-3 years.  Abdominal aortic aneurysm (AAA) screening. You may need this if you are a current or former smoker.  Osteoporosis. You may be screened starting at age 85 if you are at high risk. Talk with your health care provider about your test results, treatment options, and if necessary, the need for more tests. Vaccines  Your health care provider may recommend certain vaccines, such as:  Influenza vaccine. This is recommended every year.  Tetanus, diphtheria, and acellular pertussis (Tdap, Td) vaccine. You may need a Td booster  every 10 years.  Zoster vaccine. You may need this after age 85.  Pneumococcal 13-valent conjugate (PCV13) vaccine. One dose is recommended after age 85.  Pneumococcal polysaccharide (PPSV23) vaccine. One dose is recommended after age  75. Talk to your health care provider about which screenings and vaccines you need and how often you need them. This information is not intended to replace advice given to you by your health care provider. Make sure you discuss any questions you have with your health care provider. Document Released: 01/17/2015 Document Revised: 09/10/2015 Document Reviewed: 10/22/2014 Elsevier Interactive Patient Education  2017 [place] Prevention in the Home Falls can cause injuries. They can happen to people of all ages. There are many things you can do to make your home safe and to help prevent falls. What can I do on the outside of my home?  Regularly fix the edges of walkways and driveways and fix any cracks.  Remove anything that might make you trip as you walk through a door, such as a raised step or threshold.  Trim any bushes or trees on the path to your home.  Use bright outdoor lighting.  Clear any walking paths of anything that might make someone trip, such as rocks or tools.  Regularly check to see if handrails are loose or broken. Make sure that both sides of any steps have handrails.  Any raised decks and porches should have guardrails on the edges.  Have any leaves, snow, or ice cleared regularly.  Use sand or salt on walking paths during winter.  Clean up any spills in your garage right away. This includes oil or grease spills. What can I do in the bathroom?  Use night lights.  Install grab bars by the toilet and in the tub and shower. Do not use towel bars as grab bars.  Use non-skid mats or decals in the tub or shower.  If you need to sit down in the shower, use a plastic, non-slip stool.  Keep the floor dry. Clean up any water that spills on the floor as soon as it happens.  Remove soap buildup in the tub or shower regularly.  Attach bath mats securely with double-sided non-slip rug tape.  Do not have throw rugs and other things on the floor that can make  you trip. What can I do in the bedroom?  Use night lights.  Make sure that you have a light by your bed that is easy to reach.  Do not use any sheets or blankets that are too big for your bed. They should not hang down onto the floor.  Have a firm chair that has side arms. You can use this for support while you get dressed.  Do not have throw rugs and other things on the floor that can make you trip. What can I do in the kitchen?  Clean up any spills right away.  Avoid walking on wet floors.  Keep items that you use a lot in easy-to-reach places.  If you need to reach something above you, use a strong step stool that has a grab bar.  Keep electrical cords out of the way.  Do not use floor polish or wax that makes floors slippery. If you must use wax, use non-skid floor wax.  Do not have throw rugs and other things on the floor that can make you trip. What can I do with my stairs?  Do not leave any items on the stairs.  Make  sure that there are handrails on both sides of the stairs and use them. Fix handrails that are broken or loose. Make sure that handrails are as long as the stairways.  Check any carpeting to make sure that it is firmly attached to the stairs. Fix any carpet that is loose or worn.  Avoid having throw rugs at the top or bottom of the stairs. If you do have throw rugs, attach them to the floor with carpet tape.  Make sure that you have a light switch at the top of the stairs and the bottom of the stairs. If you do not have them, ask someone to add them for you. What else can I do to help prevent falls?  Wear shoes that:  Do not have high heels.  Have rubber bottoms.  Are comfortable and fit you well.  Are closed at the toe. Do not wear sandals.  If you use a stepladder:  Make sure that it is fully opened. Do not climb a closed stepladder.  Make sure that both sides of the stepladder are locked into place.  Ask someone to hold it for you, if  possible.  Clearly mark and make sure that you can see:  Any grab bars or handrails.  First and last steps.  Where the edge of each step is.  Use tools that help you move around (mobility aids) if they are needed. These include:  Canes.  Walkers.  Scooters.  Crutches.  Turn on the lights when you go into a dark area. Replace any light bulbs as soon as they burn out.  Set up your furniture so you have a clear path. Avoid moving your furniture around.  If any of your floors are uneven, fix them.  If there are any pets around you, be aware of where they are.  Review your medicines with your doctor. Some medicines can make you feel dizzy. This can increase your chance of falling. Ask your doctor what other things that you can do to help prevent falls. This information is not intended to replace advice given to you by your health care provider. Make sure you discuss any questions you have with your health care provider. Document Released: 10/17/2008 Document Revised: 05/29/2015 Document Reviewed: 01/25/2014 Elsevier Interactive Patient Education  2017 [employer].

## 2020-03-27 DIAGNOSIS — D631 Anemia in chronic kidney disease: Secondary | ICD-10-CM | POA: Diagnosis not present

## 2020-03-27 DIAGNOSIS — Z992 Dependence on renal dialysis: Secondary | ICD-10-CM | POA: Diagnosis not present

## 2020-03-27 DIAGNOSIS — N184 Chronic kidney disease, stage 4 (severe): Secondary | ICD-10-CM | POA: Diagnosis not present

## 2020-03-27 DIAGNOSIS — N186 End stage renal disease: Secondary | ICD-10-CM | POA: Diagnosis not present

## 2020-03-27 DIAGNOSIS — N2581 Secondary hyperparathyroidism of renal origin: Secondary | ICD-10-CM | POA: Diagnosis not present

## 2020-03-29 DIAGNOSIS — N186 End stage renal disease: Secondary | ICD-10-CM | POA: Diagnosis not present

## 2020-03-29 DIAGNOSIS — N184 Chronic kidney disease, stage 4 (severe): Secondary | ICD-10-CM | POA: Diagnosis not present

## 2020-03-29 DIAGNOSIS — Z992 Dependence on renal dialysis: Secondary | ICD-10-CM | POA: Diagnosis not present

## 2020-03-29 DIAGNOSIS — N2581 Secondary hyperparathyroidism of renal origin: Secondary | ICD-10-CM | POA: Diagnosis not present

## 2020-03-29 DIAGNOSIS — D631 Anemia in chronic kidney disease: Secondary | ICD-10-CM | POA: Diagnosis not present

## 2020-04-01 DIAGNOSIS — Z992 Dependence on renal dialysis: Secondary | ICD-10-CM | POA: Diagnosis not present

## 2020-04-01 DIAGNOSIS — N184 Chronic kidney disease, stage 4 (severe): Secondary | ICD-10-CM | POA: Diagnosis not present

## 2020-04-01 DIAGNOSIS — D631 Anemia in chronic kidney disease: Secondary | ICD-10-CM | POA: Diagnosis not present

## 2020-04-01 DIAGNOSIS — N186 End stage renal disease: Secondary | ICD-10-CM | POA: Diagnosis not present

## 2020-04-01 DIAGNOSIS — N2581 Secondary hyperparathyroidism of renal origin: Secondary | ICD-10-CM | POA: Diagnosis not present

## 2020-04-03 DIAGNOSIS — N186 End stage renal disease: Secondary | ICD-10-CM | POA: Diagnosis not present

## 2020-04-03 DIAGNOSIS — D631 Anemia in chronic kidney disease: Secondary | ICD-10-CM | POA: Diagnosis not present

## 2020-04-03 DIAGNOSIS — Z992 Dependence on renal dialysis: Secondary | ICD-10-CM | POA: Diagnosis not present

## 2020-04-03 DIAGNOSIS — N2581 Secondary hyperparathyroidism of renal origin: Secondary | ICD-10-CM | POA: Diagnosis not present

## 2020-04-03 DIAGNOSIS — N184 Chronic kidney disease, stage 4 (severe): Secondary | ICD-10-CM | POA: Diagnosis not present

## 2020-04-04 DIAGNOSIS — N2889 Other specified disorders of kidney and ureter: Secondary | ICD-10-CM | POA: Diagnosis not present

## 2020-04-04 DIAGNOSIS — N186 End stage renal disease: Secondary | ICD-10-CM | POA: Diagnosis not present

## 2020-04-04 DIAGNOSIS — Z992 Dependence on renal dialysis: Secondary | ICD-10-CM | POA: Diagnosis not present

## 2020-04-05 DIAGNOSIS — N186 End stage renal disease: Secondary | ICD-10-CM | POA: Diagnosis not present

## 2020-04-05 DIAGNOSIS — D631 Anemia in chronic kidney disease: Secondary | ICD-10-CM | POA: Diagnosis not present

## 2020-04-05 DIAGNOSIS — Z992 Dependence on renal dialysis: Secondary | ICD-10-CM | POA: Diagnosis not present

## 2020-04-05 DIAGNOSIS — N2581 Secondary hyperparathyroidism of renal origin: Secondary | ICD-10-CM | POA: Diagnosis not present

## 2020-04-05 DIAGNOSIS — N184 Chronic kidney disease, stage 4 (severe): Secondary | ICD-10-CM | POA: Diagnosis not present

## 2020-04-08 DIAGNOSIS — N2581 Secondary hyperparathyroidism of renal origin: Secondary | ICD-10-CM | POA: Diagnosis not present

## 2020-04-08 DIAGNOSIS — N184 Chronic kidney disease, stage 4 (severe): Secondary | ICD-10-CM | POA: Diagnosis not present

## 2020-04-08 DIAGNOSIS — Z992 Dependence on renal dialysis: Secondary | ICD-10-CM | POA: Diagnosis not present

## 2020-04-08 DIAGNOSIS — D631 Anemia in chronic kidney disease: Secondary | ICD-10-CM | POA: Diagnosis not present

## 2020-04-08 DIAGNOSIS — N186 End stage renal disease: Secondary | ICD-10-CM | POA: Diagnosis not present

## 2020-04-10 DIAGNOSIS — N186 End stage renal disease: Secondary | ICD-10-CM | POA: Diagnosis not present

## 2020-04-10 DIAGNOSIS — D631 Anemia in chronic kidney disease: Secondary | ICD-10-CM | POA: Diagnosis not present

## 2020-04-10 DIAGNOSIS — Z992 Dependence on renal dialysis: Secondary | ICD-10-CM | POA: Diagnosis not present

## 2020-04-10 DIAGNOSIS — N2581 Secondary hyperparathyroidism of renal origin: Secondary | ICD-10-CM | POA: Diagnosis not present

## 2020-04-10 DIAGNOSIS — N184 Chronic kidney disease, stage 4 (severe): Secondary | ICD-10-CM | POA: Diagnosis not present

## 2020-04-12 DIAGNOSIS — Z992 Dependence on renal dialysis: Secondary | ICD-10-CM | POA: Diagnosis not present

## 2020-04-12 DIAGNOSIS — D631 Anemia in chronic kidney disease: Secondary | ICD-10-CM | POA: Diagnosis not present

## 2020-04-12 DIAGNOSIS — N184 Chronic kidney disease, stage 4 (severe): Secondary | ICD-10-CM | POA: Diagnosis not present

## 2020-04-12 DIAGNOSIS — N186 End stage renal disease: Secondary | ICD-10-CM | POA: Diagnosis not present

## 2020-04-12 DIAGNOSIS — N2581 Secondary hyperparathyroidism of renal origin: Secondary | ICD-10-CM | POA: Diagnosis not present

## 2020-04-15 DIAGNOSIS — Z992 Dependence on renal dialysis: Secondary | ICD-10-CM | POA: Diagnosis not present

## 2020-04-15 DIAGNOSIS — N186 End stage renal disease: Secondary | ICD-10-CM | POA: Diagnosis not present

## 2020-04-15 DIAGNOSIS — N2581 Secondary hyperparathyroidism of renal origin: Secondary | ICD-10-CM | POA: Diagnosis not present

## 2020-04-15 DIAGNOSIS — N184 Chronic kidney disease, stage 4 (severe): Secondary | ICD-10-CM | POA: Diagnosis not present

## 2020-04-15 DIAGNOSIS — D631 Anemia in chronic kidney disease: Secondary | ICD-10-CM | POA: Diagnosis not present

## 2020-04-16 ENCOUNTER — Encounter: Payer: Self-pay | Admitting: Internal Medicine

## 2020-04-17 DIAGNOSIS — N2581 Secondary hyperparathyroidism of renal origin: Secondary | ICD-10-CM | POA: Diagnosis not present

## 2020-04-17 DIAGNOSIS — D631 Anemia in chronic kidney disease: Secondary | ICD-10-CM | POA: Diagnosis not present

## 2020-04-17 DIAGNOSIS — N186 End stage renal disease: Secondary | ICD-10-CM | POA: Diagnosis not present

## 2020-04-17 DIAGNOSIS — N184 Chronic kidney disease, stage 4 (severe): Secondary | ICD-10-CM | POA: Diagnosis not present

## 2020-04-17 DIAGNOSIS — Z992 Dependence on renal dialysis: Secondary | ICD-10-CM | POA: Diagnosis not present

## 2020-04-19 DIAGNOSIS — D631 Anemia in chronic kidney disease: Secondary | ICD-10-CM | POA: Diagnosis not present

## 2020-04-19 DIAGNOSIS — N2581 Secondary hyperparathyroidism of renal origin: Secondary | ICD-10-CM | POA: Diagnosis not present

## 2020-04-19 DIAGNOSIS — N186 End stage renal disease: Secondary | ICD-10-CM | POA: Diagnosis not present

## 2020-04-19 DIAGNOSIS — Z992 Dependence on renal dialysis: Secondary | ICD-10-CM | POA: Diagnosis not present

## 2020-04-19 DIAGNOSIS — N184 Chronic kidney disease, stage 4 (severe): Secondary | ICD-10-CM | POA: Diagnosis not present

## 2020-04-22 DIAGNOSIS — D631 Anemia in chronic kidney disease: Secondary | ICD-10-CM | POA: Diagnosis not present

## 2020-04-22 DIAGNOSIS — N2581 Secondary hyperparathyroidism of renal origin: Secondary | ICD-10-CM | POA: Diagnosis not present

## 2020-04-22 DIAGNOSIS — N184 Chronic kidney disease, stage 4 (severe): Secondary | ICD-10-CM | POA: Diagnosis not present

## 2020-04-22 DIAGNOSIS — N186 End stage renal disease: Secondary | ICD-10-CM | POA: Diagnosis not present

## 2020-04-22 DIAGNOSIS — Z992 Dependence on renal dialysis: Secondary | ICD-10-CM | POA: Diagnosis not present

## 2020-04-24 DIAGNOSIS — Z992 Dependence on renal dialysis: Secondary | ICD-10-CM | POA: Diagnosis not present

## 2020-04-24 DIAGNOSIS — N2581 Secondary hyperparathyroidism of renal origin: Secondary | ICD-10-CM | POA: Diagnosis not present

## 2020-04-24 DIAGNOSIS — N186 End stage renal disease: Secondary | ICD-10-CM | POA: Diagnosis not present

## 2020-04-24 DIAGNOSIS — N184 Chronic kidney disease, stage 4 (severe): Secondary | ICD-10-CM | POA: Diagnosis not present

## 2020-04-24 DIAGNOSIS — D631 Anemia in chronic kidney disease: Secondary | ICD-10-CM | POA: Diagnosis not present

## 2020-04-26 DIAGNOSIS — N184 Chronic kidney disease, stage 4 (severe): Secondary | ICD-10-CM | POA: Diagnosis not present

## 2020-04-26 DIAGNOSIS — Z992 Dependence on renal dialysis: Secondary | ICD-10-CM | POA: Diagnosis not present

## 2020-04-26 DIAGNOSIS — N2581 Secondary hyperparathyroidism of renal origin: Secondary | ICD-10-CM | POA: Diagnosis not present

## 2020-04-26 DIAGNOSIS — N186 End stage renal disease: Secondary | ICD-10-CM | POA: Diagnosis not present

## 2020-04-26 DIAGNOSIS — D631 Anemia in chronic kidney disease: Secondary | ICD-10-CM | POA: Diagnosis not present

## 2020-04-28 ENCOUNTER — Encounter: Payer: Medicare Other | Admitting: Adult Health

## 2020-04-29 DIAGNOSIS — N184 Chronic kidney disease, stage 4 (severe): Secondary | ICD-10-CM | POA: Diagnosis not present

## 2020-04-29 DIAGNOSIS — N2581 Secondary hyperparathyroidism of renal origin: Secondary | ICD-10-CM | POA: Diagnosis not present

## 2020-04-29 DIAGNOSIS — Z992 Dependence on renal dialysis: Secondary | ICD-10-CM | POA: Diagnosis not present

## 2020-04-29 DIAGNOSIS — D631 Anemia in chronic kidney disease: Secondary | ICD-10-CM | POA: Diagnosis not present

## 2020-04-29 DIAGNOSIS — N186 End stage renal disease: Secondary | ICD-10-CM | POA: Diagnosis not present

## 2020-04-30 DIAGNOSIS — Z992 Dependence on renal dialysis: Secondary | ICD-10-CM | POA: Diagnosis not present

## 2020-04-30 DIAGNOSIS — N186 End stage renal disease: Secondary | ICD-10-CM | POA: Diagnosis not present

## 2020-04-30 DIAGNOSIS — T82858A Stenosis of vascular prosthetic devices, implants and grafts, initial encounter: Secondary | ICD-10-CM | POA: Diagnosis not present

## 2020-04-30 DIAGNOSIS — I871 Compression of vein: Secondary | ICD-10-CM | POA: Diagnosis not present

## 2020-05-01 DIAGNOSIS — N184 Chronic kidney disease, stage 4 (severe): Secondary | ICD-10-CM | POA: Diagnosis not present

## 2020-05-01 DIAGNOSIS — Z992 Dependence on renal dialysis: Secondary | ICD-10-CM | POA: Diagnosis not present

## 2020-05-01 DIAGNOSIS — N186 End stage renal disease: Secondary | ICD-10-CM | POA: Diagnosis not present

## 2020-05-01 DIAGNOSIS — D631 Anemia in chronic kidney disease: Secondary | ICD-10-CM | POA: Diagnosis not present

## 2020-05-01 DIAGNOSIS — N2581 Secondary hyperparathyroidism of renal origin: Secondary | ICD-10-CM | POA: Diagnosis not present

## 2020-05-03 DIAGNOSIS — D631 Anemia in chronic kidney disease: Secondary | ICD-10-CM | POA: Diagnosis not present

## 2020-05-03 DIAGNOSIS — N2581 Secondary hyperparathyroidism of renal origin: Secondary | ICD-10-CM | POA: Diagnosis not present

## 2020-05-03 DIAGNOSIS — N186 End stage renal disease: Secondary | ICD-10-CM | POA: Diagnosis not present

## 2020-05-03 DIAGNOSIS — N184 Chronic kidney disease, stage 4 (severe): Secondary | ICD-10-CM | POA: Diagnosis not present

## 2020-05-03 DIAGNOSIS — Z992 Dependence on renal dialysis: Secondary | ICD-10-CM | POA: Diagnosis not present

## 2020-05-04 DIAGNOSIS — Z992 Dependence on renal dialysis: Secondary | ICD-10-CM | POA: Diagnosis not present

## 2020-05-04 DIAGNOSIS — N186 End stage renal disease: Secondary | ICD-10-CM | POA: Diagnosis not present

## 2020-05-04 DIAGNOSIS — N2889 Other specified disorders of kidney and ureter: Secondary | ICD-10-CM | POA: Diagnosis not present

## 2020-05-05 ENCOUNTER — Other Ambulatory Visit: Payer: Self-pay

## 2020-05-05 ENCOUNTER — Non-Acute Institutional Stay: Payer: Medicare Other | Admitting: Adult Health

## 2020-05-05 ENCOUNTER — Encounter: Payer: Self-pay | Admitting: Adult Health

## 2020-05-05 VITALS — BP 144/86 | HR 51 | Temp 97.0°F | Ht 70.0 in | Wt 145.4 lb

## 2020-05-05 DIAGNOSIS — E785 Hyperlipidemia, unspecified: Secondary | ICD-10-CM

## 2020-05-05 DIAGNOSIS — J9611 Chronic respiratory failure with hypoxia: Secondary | ICD-10-CM

## 2020-05-05 DIAGNOSIS — I5020 Unspecified systolic (congestive) heart failure: Secondary | ICD-10-CM

## 2020-05-05 DIAGNOSIS — K219 Gastro-esophageal reflux disease without esophagitis: Secondary | ICD-10-CM | POA: Diagnosis not present

## 2020-05-05 DIAGNOSIS — I953 Hypotension of hemodialysis: Secondary | ICD-10-CM

## 2020-05-05 DIAGNOSIS — N186 End stage renal disease: Secondary | ICD-10-CM

## 2020-05-05 DIAGNOSIS — I2510 Atherosclerotic heart disease of native coronary artery without angina pectoris: Secondary | ICD-10-CM | POA: Diagnosis not present

## 2020-05-05 DIAGNOSIS — Z992 Dependence on renal dialysis: Secondary | ICD-10-CM

## 2020-05-05 NOTE — Progress Notes (Signed)
Location: [facility]  POS:  clinic  Provider:  Cindi Carbon, [place] [phone]   Code Status: DNR  Goals of Care:  Advanced Directives 03/25/2020  Does Patient Have a Medical Advance Directive? Yes  Type of Advance Directive Out of facility DNR (pink MOST or yellow form)  Does patient want to make changes to medical advance directive? No - Patient declined  Copy of [place] in Chart? -  Would patient like information on creating a medical advance directive? -  Pre-existing out of facility DNR order (yellow form or pink MOST form) Yellow form placed in chart (order not valid for inpatient use)     Chief Complaint  Patient presents with  . Medical Management of Chronic Issues    Patient returns to the clinic for follow up.     HPI: Patient is a 87 y.o. male seen today for medical management of chronic diseases.    PMH significant for CHF, ESRD on dialysis x 7 years, GERD, gout, anxiety, arthritis, heart murmur, aortic valve replacement, CABG, low bp, and hx of cardiac arrest during anesthesia.   He has all his labs drawn at the dialysis center. He has no acute complaints. He feels he is slowing down and has more fatigue.  His goal [place] continue [place] that he care for with dementia as long as he can.   He has some DOE and PND. Uses oxygen at night and keeps his HOB elevated. Symptoms are worse on non dialysis days.   Uses PWC for long distances. Can ambulate short distances.   Oriented and continues to play bridge.    Past Medical History:  Diagnosis Date  . Anxiety   . Arthritis   . CHF (congestive heart failure) (HCC)    chronic systolic CHF  . Complication of anesthesia    " one time my heart stopped due to a medication that I was on."   . Coronary artery disease   . ESRD (end stage renal disease) on dialysis ([place])    Tues, Thursday, Saturday; [facility]; [place] (10/10/2017)  . GERD  (gastroesophageal reflux disease)   . Gout   . Heart murmur    as a teen  . Herpes zoster 04/2012  . Hypertension    08/28/19- now has hypotension  . Left bundle branch block   . Pneumonia 10/2018  . Shortness of breath dyspnea    with exertion  "walking, probably due to my heart not eorking well"    Past Surgical History:  Procedure Laterality Date  . AORTIC VALVE REPLACEMENT N/A 12/09/2014   Procedure: AORTIC VALVE REPLACEMENT (AVR) WITH 23MM MAGNA EASE BIOPROSTHETIC VALVE;  Surgeon: Gaye Pollack, MD;  Location: [facility] OR;  Service: Open Heart Surgery;  Laterality: N/A;  . APPENDECTOMY  1944  . AV FISTULA PLACEMENT Left    Left Cim[place] AVF placed in [place]   . AV FISTULA PLACEMENT Right 03/06/2014   Procedure: ARTERIOVENOUS (AV) FISTULA CREATION-right radiocephalic;  Surgeon: Mal Misty, MD;  Location: [facility] OR;  Service: Vascular;  Laterality: Right;  . AV FISTULA PLACEMENT Right 07/15/2014   Procedure: ARTERIOVENOUS (AV) FISTULA CREATION;  Surgeon: Elam Dutch, MD;  Location: [place];  Service: Vascular;  Laterality: Right;  . CARDIAC CATHETERIZATION N/A 11/27/2014   Procedure: Right/Left Heart Cath and Coronary Angiography;  Surgeon: Burnell Blanks, MD;  Location: [facility];  Service: Cardiovascular;  Laterality: N/A;  . CATARACT EXTRACTION W/ INTRAOCULAR LENS IMPLANT Bilateral 2014   [facility]  . COLONOSCOPY  2013   Dr. Annamaria Helling [place], [place].  . CORONARY ARTERY BYPASS GRAFT N/A 12/09/2014   Procedure: CORONARY ARTERY BYPASS GRAFTING (CABG), ON PUMP, TIMES THREE, USING LEFT INTERNAL MAMMARY ARTERY, RIGHT GREATER SAPHENOUS VEIN HARVESTED ENDOSCOPICALLY;  Surgeon: Gaye Pollack, MD;  Location: [facility];  Service: Open Heart Surgery;  Laterality: N/A;  LIMA-LAD; SVG-OM; SVG-PD  . DIALYSIS FISTULA CREATION  08/04/2012 and 10/13   Dr. Harden Mo  . FISTULA SUPERFICIALIZATION Right 08/29/2019   Procedure: RIGHT ARTERIOVENOUS FISTULA  PLICATION;  Surgeon:  Serafina Mitchell, MD;  [facility];  Service: Vascular;  Laterality: Right;  . FISTULOGRAM Right 04/12/2019   Procedure: FISTULOGRAM with Balloon Venoplasty of Right Peripheral Vein;  Surgeon: Serafina Mitchell, MD;  Location: [place];  Service: Vascular;  Laterality: Right;  . FISTULOGRAM Right 08/29/2019   Procedure: FISTULOGRAM;  Surgeon: Serafina Mitchell, MD;  Location: [facility] OR;  Service: Vascular;  Laterality: Right;  . INSERTION OF DIALYSIS CATHETER Right 01-15-14   Right chest TDC placed by Dr. Augustin Coupe at [facility]  . LIGATION OF ARTERIOVENOUS  FISTULA Right 07/15/2014   Procedure: LIGATION OF ARTERIOVENOUS  FISTULA  (RIGHT RADIOCEPHALIC);  Surgeon: Elam Dutch, MD;  Location: [place];  Service: Vascular;  Laterality: Right;  . LIGATION OF COMPETING BRANCHES OF ARTERIOVENOUS FISTULA Right 05/08/2014   Procedure: LIGATION OF COMPETING BRANCHES OF RIGHT ARM RADIOCEPHALIC ARTERIOVENOUS FISTULA;  Surgeon: Mal Misty, MD;  Location: [place];  Service: Vascular;  Laterality: Right;  . RESECTION OF ARTERIOVENOUS FISTULA ANEURYSM Right 08/29/2019   Procedure: RESECTION OF ARTERIOVENOUS FISTULA ANEURYSM;  Surgeon: Serafina Mitchell, MD;  Location: [place];  Service: Vascular;  Laterality: Right;  . REVISON OF ARTERIOVENOUS FISTULA Right 07/15/2014   Procedure: EXPLORATION OF ARTERIOVENOUS FISTULA;  Surgeon: Elam Dutch, MD;  Location: [facility];  Service: Vascular;  Laterality: Right;  . REVISON OF ARTERIOVENOUS FISTULA Right 04/12/2019   Procedure: REVISON OF ARTERIOVENOUS FISTULA;  Surgeon: Serafina Mitchell, MD;  Location: [place];  Service: Vascular;  Laterality: Right;  . TEE WITHOUT CARDIOVERSION N/A 12/09/2014   Procedure: TRANSESOPHAGEAL ECHOCARDIOGRAM (TEE);  Surgeon: Gaye Pollack, MD;  Location: [place];  Service: Open Heart Surgery;  Laterality: N/A;  . VENOPLASTY Right 08/29/2019   Procedure: PERIPHERAL VENOPLASTY;  Surgeon: Serafina Mitchell, MD;  [facility];  Service: Vascular;  Laterality:  Right;    No Known Allergies  Outpatient Encounter Medications as of 05/05/2020  Medication Sig  . acetaminophen (TYLENOL) 500 MG tablet Take 1,000 mg by mouth 3 (three) times daily as needed for moderate pain.   Marland Kitchen allopurinol (ZYLOPRIM) 100 MG tablet TAKE 1 TABLET BY MOUTH DAILY  . atorvastatin (LIPITOR) 20 MG tablet TAKE 1 TABLET BY MOUTH DAILY FOR CHOLESTEROL  . calcium carbonate (TUMS EX) 750 MG chewable tablet Chew 1,500 mg by mouth 3 (three) times daily with meals.  . cinacalcet (SENSIPAR) 60 MG tablet Take 60 mg by mouth daily.   . midodrine (PROAMATINE) 10 MG tablet Take 1 tablet (10 mg total) by mouth 3 (three) times daily. OVERDUE FOR FOLLOW UP. PLEASE CALL AND SCHEDULE - 1ST ATTEMPT  . omeprazole (PRILOSEC) 20 MG capsule Take 20 mg by mouth daily.    No facility-administered encounter medications on file as of 05/05/2020.    Review of Systems:  Review of Systems  Constitutional: Negative for activity change, appetite change, chills, diaphoresis, fatigue, fever and unexpected weight change.  HENT:       Hearing getting worse  Eyes:       Wears glasses  Respiratory: Positive for shortness of breath (on exertion ). Negative for cough, wheezing and stridor.   Cardiovascular: Positive for leg swelling. Negative for chest pain and palpitations.  Gastrointestinal: Negative for abdominal distention, abdominal pain, constipation and diarrhea.  Genitourinary: Negative for difficulty urinating and dysuria.       Does not make urine  Musculoskeletal: Positive for gait problem. Negative for arthralgias, back pain, joint swelling and myalgias.  Neurological: Negative for dizziness, seizures, syncope, facial asymmetry, speech difficulty, weakness and headaches.  Hematological: Negative for adenopathy. Does not bruise/bleed easily.  Psychiatric/Behavioral: Negative for agitation, behavioral problems and confusion.    Health Maintenance  Topic Date Due  . INFLUENZA VACCINE  08/04/2020  .  TETANUS/TDAP  06/02/2027  . COVID-19 Vaccine  Completed  . PNA vac Low Risk Adult  Completed  . HPV VACCINES  Aged Out    Physical Exam: Vitals:   05/05/20 1528  BP: (!) 144/86  Pulse: (!) 51  Temp: (!) 97 F (36.1 C)  SpO2: 98%  Weight: 145 lb 6.4 oz (66 kg)  Height: 5\' 10"  (1.778 m)   Body mass index is 20.86 kg/m.  Wt Readings from Last 3 Encounters:  05/05/20 145 lb 6.4 oz (66 kg)  12/05/19 163 lb (73.9 kg)  12/03/19 164 lb 3.2 oz (74.5 kg)    Physical Exam Vitals and nursing note reviewed.  Constitutional:      General: He is not in acute distress.    Appearance: He is not diaphoretic.  HENT:     Head: Normocephalic and atraumatic.     Right Ear: Ear canal and external ear normal. There is impacted cerumen.     Left Ear: Tympanic membrane and ear canal normal.     Nose: Rhinorrhea present.     Mouth/Throat:     Mouth: Mucous membranes are moist.     Pharynx: Oropharynx is clear. No oropharyngeal exudate.  Eyes:     Conjunctiva/sclera: Conjunctivae normal.     Pupils: Pupils are equal, round, and reactive to light.  Neck:     Thyroid: No thyromegaly.     Vascular: No JVD.     Trachea: No tracheal deviation.  Cardiovascular:     Rate and Rhythm: Normal rate and regular rhythm.     Heart sounds: Murmur heard.    Pulmonary:     Effort: Pulmonary effort is normal. No respiratory distress.     Breath sounds: Normal breath sounds. No wheezing.  Abdominal:     General: Bowel sounds are normal. There is no distension.     Palpations: Abdomen is soft.     Tenderness: There is no abdominal tenderness.  Musculoskeletal:        General: Deformity present.     Comments: Trace BLE kyphosis   Lymphadenopathy:     Cervical: No cervical adenopathy.  Skin:    General: Skin is warm and dry.  Neurological:     Mental Status: He is alert and oriented to person, place, and time.     Cranial Nerves: No cranial nerve deficit.  Psychiatric:        Mood and Affect: Mood  normal.     Labs reviewed: Basic Metabolic Panel: Recent Labs    08/29/19 0953 11/30/19 0555  NA 138 135  K 4.3 5.8*  CL 97* 93*  CO2  --  27  GLUCOSE 110* 104*  BUN 43* 48*  CREATININE 7.20* 6.77*  CALCIUM  --  8.8*   Liver Function Tests: No results for input(s): AST, ALT, ALKPHOS, BILITOT, PROT, ALBUMIN in the last 8760 hours. No results for input(s): LIPASE, AMYLASE in the last 8760 hours. No results for input(s): AMMONIA in the last 8760 hours. CBC: Recent Labs    08/29/19 0953 11/30/19 0339  WBC  --  10.4  NEUTROABS  --  8.1*  HGB 12.2* 10.8*  HCT 36.0* 33.4*  MCV  --  108.1*  PLT  --  226   Lipid Panel: No results for input(s): CHOL, HDL, LDLCALC, TRIG, CHOLHDL, LDLDIRECT in the last 8760 hours. Lab Results  Component Value Date   HGBA1C 5.2 12/06/2014    Procedures since last visit: No results found.  Assessment/Plan  1. Systolic CHF with reduced left ventricular function, NYHA class 3 (HCC) Weight has trended down over the past year Volume status managed by dialysis  Continues with symptoms at night management with oxygen   2. Hemodialysis-associated hypotension Continue midodrine 10 mg tid   3. Coronary artery disease involving native coronary artery of native heart without angina pectoris S/p cabg Takes lipitor   4. Chronic respiratory failure with hypoxia (HCC) Likely due to pulmonary venous congestion due to ESRD and CHF Did not qualify for oxygen but supplies his own and uses 4 nights a week   5. Gastroesophageal reflux disease without esophagitis Continue Prilosec 20 mg q d  6. ESRD on dialysis [facility]) Continues for the past 7 years three times weekly   7. Hyperlipidemia, unspecified hyperlipidemia type Lipids check at dialysis Continue lipitor    Total time 30 min: greater than 50% of total time spent doing pt counseling and coordination of care     Labs/tests ordered:  Done at dialysis  Next appt:   F/u in 4 months with  Dr. Lyndel Safe.

## 2020-05-06 DIAGNOSIS — N184 Chronic kidney disease, stage 4 (severe): Secondary | ICD-10-CM | POA: Diagnosis not present

## 2020-05-06 DIAGNOSIS — N186 End stage renal disease: Secondary | ICD-10-CM | POA: Diagnosis not present

## 2020-05-06 DIAGNOSIS — D631 Anemia in chronic kidney disease: Secondary | ICD-10-CM | POA: Diagnosis not present

## 2020-05-06 DIAGNOSIS — N2581 Secondary hyperparathyroidism of renal origin: Secondary | ICD-10-CM | POA: Diagnosis not present

## 2020-05-06 DIAGNOSIS — Z992 Dependence on renal dialysis: Secondary | ICD-10-CM | POA: Diagnosis not present

## 2020-05-08 DIAGNOSIS — N184 Chronic kidney disease, stage 4 (severe): Secondary | ICD-10-CM | POA: Diagnosis not present

## 2020-05-08 DIAGNOSIS — D631 Anemia in chronic kidney disease: Secondary | ICD-10-CM | POA: Diagnosis not present

## 2020-05-08 DIAGNOSIS — N2581 Secondary hyperparathyroidism of renal origin: Secondary | ICD-10-CM | POA: Diagnosis not present

## 2020-05-08 DIAGNOSIS — Z992 Dependence on renal dialysis: Secondary | ICD-10-CM | POA: Diagnosis not present

## 2020-05-08 DIAGNOSIS — N186 End stage renal disease: Secondary | ICD-10-CM | POA: Diagnosis not present

## 2020-05-10 DIAGNOSIS — D631 Anemia in chronic kidney disease: Secondary | ICD-10-CM | POA: Diagnosis not present

## 2020-05-10 DIAGNOSIS — N2581 Secondary hyperparathyroidism of renal origin: Secondary | ICD-10-CM | POA: Diagnosis not present

## 2020-05-10 DIAGNOSIS — N186 End stage renal disease: Secondary | ICD-10-CM | POA: Diagnosis not present

## 2020-05-10 DIAGNOSIS — Z992 Dependence on renal dialysis: Secondary | ICD-10-CM | POA: Diagnosis not present

## 2020-05-10 DIAGNOSIS — N184 Chronic kidney disease, stage 4 (severe): Secondary | ICD-10-CM | POA: Diagnosis not present

## 2020-05-13 DIAGNOSIS — N184 Chronic kidney disease, stage 4 (severe): Secondary | ICD-10-CM | POA: Diagnosis not present

## 2020-05-13 DIAGNOSIS — Z992 Dependence on renal dialysis: Secondary | ICD-10-CM | POA: Diagnosis not present

## 2020-05-13 DIAGNOSIS — D631 Anemia in chronic kidney disease: Secondary | ICD-10-CM | POA: Diagnosis not present

## 2020-05-13 DIAGNOSIS — N2581 Secondary hyperparathyroidism of renal origin: Secondary | ICD-10-CM | POA: Diagnosis not present

## 2020-05-13 DIAGNOSIS — N186 End stage renal disease: Secondary | ICD-10-CM | POA: Diagnosis not present

## 2020-05-15 DIAGNOSIS — D631 Anemia in chronic kidney disease: Secondary | ICD-10-CM | POA: Diagnosis not present

## 2020-05-15 DIAGNOSIS — N184 Chronic kidney disease, stage 4 (severe): Secondary | ICD-10-CM | POA: Diagnosis not present

## 2020-05-15 DIAGNOSIS — N2581 Secondary hyperparathyroidism of renal origin: Secondary | ICD-10-CM | POA: Diagnosis not present

## 2020-05-15 DIAGNOSIS — N186 End stage renal disease: Secondary | ICD-10-CM | POA: Diagnosis not present

## 2020-05-15 DIAGNOSIS — Z992 Dependence on renal dialysis: Secondary | ICD-10-CM | POA: Diagnosis not present

## 2020-05-17 DIAGNOSIS — D631 Anemia in chronic kidney disease: Secondary | ICD-10-CM | POA: Diagnosis not present

## 2020-05-17 DIAGNOSIS — Z992 Dependence on renal dialysis: Secondary | ICD-10-CM | POA: Diagnosis not present

## 2020-05-17 DIAGNOSIS — N184 Chronic kidney disease, stage 4 (severe): Secondary | ICD-10-CM | POA: Diagnosis not present

## 2020-05-17 DIAGNOSIS — N2581 Secondary hyperparathyroidism of renal origin: Secondary | ICD-10-CM | POA: Diagnosis not present

## 2020-05-17 DIAGNOSIS — N186 End stage renal disease: Secondary | ICD-10-CM | POA: Diagnosis not present

## 2020-05-20 DIAGNOSIS — N186 End stage renal disease: Secondary | ICD-10-CM | POA: Diagnosis not present

## 2020-05-20 DIAGNOSIS — N2581 Secondary hyperparathyroidism of renal origin: Secondary | ICD-10-CM | POA: Diagnosis not present

## 2020-05-20 DIAGNOSIS — Z992 Dependence on renal dialysis: Secondary | ICD-10-CM | POA: Diagnosis not present

## 2020-05-20 DIAGNOSIS — N184 Chronic kidney disease, stage 4 (severe): Secondary | ICD-10-CM | POA: Diagnosis not present

## 2020-05-20 DIAGNOSIS — D631 Anemia in chronic kidney disease: Secondary | ICD-10-CM | POA: Diagnosis not present

## 2020-05-21 ENCOUNTER — Telehealth: Payer: Self-pay | Admitting: Internal Medicine

## 2020-05-21 NOTE — Telephone Encounter (Signed)
Spoke with pt who reports dialysis center staff recommended pt be seen by cardiology d/t increasing SOB.  Pt has a history of dyspnea on exertion.  He denies edema at this time.  Appointment scheduled with Oda Kilts on 05/28/2020.  Reviewed ED precautions and continue current medications and pt verbalizes understanding and agrees with current plan.

## 2020-05-21 NOTE — Telephone Encounter (Signed)
Pt c/o Shortness Of Breath: STAT if SOB developed within the last 24 hours or pt is noticeably SOB on the phone  1. Are you currently SOB (can you hear that pt is SOB on the phone)? No only at night. Pt is not noticeable SOB while on the phone  2. How long have you been experiencing SOB? On and off for a long time  3. Are you SOB when sitting or when up moving around? Moving around  4. Are you currently experiencing any other symptoms? Weakness for 1 week

## 2020-05-22 DIAGNOSIS — N184 Chronic kidney disease, stage 4 (severe): Secondary | ICD-10-CM | POA: Diagnosis not present

## 2020-05-22 DIAGNOSIS — N186 End stage renal disease: Secondary | ICD-10-CM | POA: Diagnosis not present

## 2020-05-22 DIAGNOSIS — Z992 Dependence on renal dialysis: Secondary | ICD-10-CM | POA: Diagnosis not present

## 2020-05-22 DIAGNOSIS — D631 Anemia in chronic kidney disease: Secondary | ICD-10-CM | POA: Diagnosis not present

## 2020-05-22 DIAGNOSIS — N2581 Secondary hyperparathyroidism of renal origin: Secondary | ICD-10-CM | POA: Diagnosis not present

## 2020-05-24 DIAGNOSIS — Z992 Dependence on renal dialysis: Secondary | ICD-10-CM | POA: Diagnosis not present

## 2020-05-24 DIAGNOSIS — D631 Anemia in chronic kidney disease: Secondary | ICD-10-CM | POA: Diagnosis not present

## 2020-05-24 DIAGNOSIS — N184 Chronic kidney disease, stage 4 (severe): Secondary | ICD-10-CM | POA: Diagnosis not present

## 2020-05-24 DIAGNOSIS — N186 End stage renal disease: Secondary | ICD-10-CM | POA: Diagnosis not present

## 2020-05-24 DIAGNOSIS — N2581 Secondary hyperparathyroidism of renal origin: Secondary | ICD-10-CM | POA: Diagnosis not present

## 2020-05-27 DIAGNOSIS — N2581 Secondary hyperparathyroidism of renal origin: Secondary | ICD-10-CM | POA: Diagnosis not present

## 2020-05-27 DIAGNOSIS — N184 Chronic kidney disease, stage 4 (severe): Secondary | ICD-10-CM | POA: Diagnosis not present

## 2020-05-27 DIAGNOSIS — D631 Anemia in chronic kidney disease: Secondary | ICD-10-CM | POA: Diagnosis not present

## 2020-05-27 DIAGNOSIS — Z992 Dependence on renal dialysis: Secondary | ICD-10-CM | POA: Diagnosis not present

## 2020-05-27 DIAGNOSIS — N186 End stage renal disease: Secondary | ICD-10-CM | POA: Diagnosis not present

## 2020-05-28 ENCOUNTER — Ambulatory Visit (INDEPENDENT_AMBULATORY_CARE_PROVIDER_SITE_OTHER): Payer: Medicare Other | Admitting: Student

## 2020-05-28 ENCOUNTER — Other Ambulatory Visit: Payer: Self-pay

## 2020-05-28 ENCOUNTER — Encounter: Payer: Self-pay | Admitting: Student

## 2020-05-28 VITALS — BP 96/52 | HR 88 | Ht 70.0 in | Wt 160.8 lb

## 2020-05-28 DIAGNOSIS — I951 Orthostatic hypotension: Secondary | ICD-10-CM

## 2020-05-28 DIAGNOSIS — I5022 Chronic systolic (congestive) heart failure: Secondary | ICD-10-CM

## 2020-05-28 DIAGNOSIS — R0602 Shortness of breath: Secondary | ICD-10-CM | POA: Diagnosis not present

## 2020-05-28 DIAGNOSIS — I2510 Atherosclerotic heart disease of native coronary artery without angina pectoris: Secondary | ICD-10-CM

## 2020-05-28 DIAGNOSIS — I447 Left bundle-branch block, unspecified: Secondary | ICD-10-CM | POA: Diagnosis not present

## 2020-05-28 NOTE — Patient Instructions (Signed)
Medication Instructions:  Your physician recommends that you continue on your current medications as directed. Please refer to the Current Medication list given to you today.  *If you need a refill on your cardiac medications before your next appointment, please call your pharmacy*   Lab Work: BMET, CBC  If you have labs (blood work) drawn today and your tests are completely normal, you will receive your results only by: Marland Kitchen MyChart Message (if you have MyChart) OR . A paper copy in the mail If you have any lab test that is abnormal or we need to change your treatment, we will call you to review the results.   Testing/Procedures: Your physician has requested that you have an echocardiogram. Echocardiography is a painless test that uses sound waves to create images of your heart. It provides your doctor with information about the size and shape of your heart and how well your heart's chambers and valves are working. This procedure takes approximately one hour. There are no restrictions for this procedure.  Follow-Up: At [facility], you and your health needs are our priority.  As part of our continuing mission to provide you with exceptional heart care, we have created designated Provider Care Teams.  These Care Teams include your primary Cardiologist (physician) and Advanced Practice Providers (APPs -  Physician Assistants and Nurse Practitioners) who all work together to provide you with the care you need, when you need it.  We recommend signing up for the patient portal called "MyChart".  Sign up information is provided on this After Visit Summary.  MyChart is used to connect with patients for Virtual Visits (Telemedicine).  Patients are able to view lab/test results, encounter notes, upcoming appointments, etc.  Non-urgent messages can be sent to your provider as well.   To learn more about what you can do with MyChart, go to [email or web address].    Your next appointment:   2-3  month(s)  The format for your next appointment:   In Person  Provider:   Virl Axe, MD

## 2020-05-28 NOTE — Progress Notes (Signed)
PCP:  Royal Hawthorn, NP Primary Cardiologist: None Electrophysiologist: Virl Axe, MD   Eric [name] is a 87 y.o. male with history of ESRF on HD, HTN >> now with relative hypotension, HLD, GERD, Gout, VHD s/p AVR (bioprosthetic, 2016), CAD (CABG 2016), ICM, chronic CHF (systolic), LBBB seen today for Virl Axe, MD for acute visit due to worsening DOE.Marland Kitchen  Since last being seen in our clinic the patient reports doing approximately 10 days of fatigue and DOE. He states this is worse in the evenings, especially on NON-dialysis days. He feels better in the evenings after dialysis. He notes increased BMs that started a few days before these symptoms, but he is unsure of consistency. He has 3-4 BMs a day currently. He is fatigue in the shower and has to sit down to towel off. He is SOB getting dressed and has had to ask for assistance at [facility] at least once. He denies chest pain, palpitations, dyspnea, PND, orthopnea, nausea, vomiting, dizziness, syncope, or weight gain. He does not have edema today and feels this is well controlled on HD T/Th/Saturday  Past Medical History:  Diagnosis Date  . Anxiety   . Arthritis   . CHF (congestive heart failure) (HCC)    chronic systolic CHF  . Complication of anesthesia    " one time my heart stopped due to a medication that I was on."   . Coronary artery disease   . ESRD (end stage renal disease) on dialysis ([place])    Tues, Thursday, Saturday; [facility]; [place] (10/10/2017)  . GERD (gastroesophageal reflux disease)   . Gout   . Heart murmur    as a teen  . Herpes zoster 04/2012  . Hypertension    08/28/19- now has hypotension  . Left bundle branch block   . Pneumonia 10/2018  . Shortness of breath dyspnea    with exertion  "walking, probably due to my heart not eorking well"   Past Surgical History:  Procedure Laterality Date  . AORTIC VALVE REPLACEMENT N/A 12/09/2014   Procedure: AORTIC VALVE REPLACEMENT (AVR) WITH 23MM MAGNA  EASE BIOPROSTHETIC VALVE;  Surgeon: Gaye Pollack, MD;  Location: [facility] OR;  Service: Open Heart Surgery;  Laterality: N/A;  . APPENDECTOMY  1944  . AV FISTULA PLACEMENT Left    Left Cimino AVF placed in [place]   . AV FISTULA PLACEMENT Right 03/06/2014   Procedure: ARTERIOVENOUS (AV) FISTULA CREATION-right radiocephalic;  Surgeon: Mal Misty, MD;  Location: [facility] OR;  Service: Vascular;  Laterality: Right;  . AV FISTULA PLACEMENT Right 07/15/2014   Procedure: ARTERIOVENOUS (AV) FISTULA CREATION;  Surgeon: Elam Dutch, MD;  Location: [place] [facility];  Service: Vascular;  Laterality: Right;  . CARDIAC CATHETERIZATION N/A 11/27/2014   Procedure: Right/Left Heart Cath and Coronary Angiography;  Surgeon: Burnell Blanks, MD;  Location: [facility];  Service: Cardiovascular;  Laterality: N/A;  . CATARACT EXTRACTION W/ INTRAOCULAR LENS IMPLANT Bilateral 2014   [facility]  . COLONOSCOPY  2013   Dr. Annamaria Helling [place], [place].  . CORONARY ARTERY BYPASS GRAFT N/A 12/09/2014   Procedure: CORONARY ARTERY BYPASS GRAFTING (CABG), ON PUMP, TIMES THREE, USING LEFT INTERNAL MAMMARY ARTERY, RIGHT GREATER SAPHENOUS VEIN HARVESTED ENDOSCOPICALLY;  Surgeon: Gaye Pollack, MD;  Location: [place];  Service: Open Heart Surgery;  Laterality: N/A;  LIMA-LAD; SVG-OM; SVG-PD  . DIALYSIS FISTULA CREATION  08/04/2012 and 10/13   Dr. Harden Mo  . FISTULA SUPERFICIALIZATION Right 08/29/2019  Procedure: RIGHT ARTERIOVENOUS FISTULA  PLICATION;  Surgeon: Serafina Mitchell, MD;  Location: [facility] OR;  Service: Vascular;  Laterality: Right;  . FISTULOGRAM Right 04/12/2019   Procedure: FISTULOGRAM with Balloon Venoplasty of Right Peripheral Vein;  Surgeon: Serafina Mitchell, MD;  Location: [place];  Service: Vascular;  Laterality: Right;  . FISTULOGRAM Right 08/29/2019   Procedure: FISTULOGRAM;  Surgeon: Serafina Mitchell, MD;  Location: [facility] OR;  Service: Vascular;  Laterality: Right;  . INSERTION OF DIALYSIS CATHETER Right  01-15-14   Right chest TDC placed by Dr. Augustin Coupe at [facility]  . LIGATION OF ARTERIOVENOUS  FISTULA Right 07/15/2014   Procedure: LIGATION OF ARTERIOVENOUS  FISTULA  (RIGHT RADIOCEPHALIC);  Surgeon: Elam Dutch, MD;  Location: [place];  Service: Vascular;  Laterality: Right;  . LIGATION OF COMPETING BRANCHES OF ARTERIOVENOUS FISTULA Right 05/08/2014   Procedure: LIGATION OF COMPETING BRANCHES OF RIGHT ARM RADIOCEPHALIC ARTERIOVENOUS FISTULA;  Surgeon: Mal Misty, MD;  Location: [place];  Service: Vascular;  Laterality: Right;  . RESECTION OF ARTERIOVENOUS FISTULA ANEURYSM Right 08/29/2019   Procedure: RESECTION OF ARTERIOVENOUS FISTULA ANEURYSM;  Surgeon: Serafina Mitchell, MD;  Location: [facility];  Service: Vascular;  Laterality: Right;  . REVISON OF ARTERIOVENOUS FISTULA Right 07/15/2014   Procedure: EXPLORATION OF ARTERIOVENOUS FISTULA;  Surgeon: Elam Dutch, MD;  Location: [place];  Service: Vascular;  Laterality: Right;  . REVISON OF ARTERIOVENOUS FISTULA Right 04/12/2019   Procedure: REVISON OF ARTERIOVENOUS FISTULA;  Surgeon: Serafina Mitchell, MD;  Location: [place];  Service: Vascular;  Laterality: Right;  . TEE WITHOUT CARDIOVERSION N/A 12/09/2014   Procedure: TRANSESOPHAGEAL ECHOCARDIOGRAM (TEE);  Surgeon: Gaye Pollack, MD;  Location: [place];  Service: Open Heart Surgery;  Laterality: N/A;  . VENOPLASTY Right 08/29/2019   Procedure: PERIPHERAL VENOPLASTY;  Surgeon: Serafina Mitchell, MD;  Location: [facility] OR;  Service: Vascular;  Laterality: Right;    Current Outpatient Medications  Medication Sig Dispense Refill  . acetaminophen (TYLENOL) 500 MG tablet Take 1,000 mg by mouth 3 (three) times daily as needed for moderate pain.     Marland Kitchen allopurinol (ZYLOPRIM) 100 MG tablet TAKE 1 TABLET BY MOUTH DAILY 90 tablet 3  . atorvastatin (LIPITOR) 20 MG tablet TAKE 1 TABLET BY MOUTH DAILY FOR CHOLESTEROL 90 tablet 3  . calcium carbonate (TUMS EX) 750 MG chewable tablet Chew 1,500 mg by mouth 3 (three) times  daily with meals.    . cinacalcet (SENSIPAR) 60 MG tablet Take 60 mg by mouth daily.     . midodrine (PROAMATINE) 10 MG tablet Take 1 tablet (10 mg total) by mouth 3 (three) times daily. OVERDUE FOR FOLLOW UP. PLEASE CALL AND SCHEDULE - 1ST ATTEMPT 90 tablet 0  . omeprazole (PRILOSEC) 20 MG capsule Take 20 mg by mouth daily.      No current facility-administered medications for this visit.    No Known Allergies  Social History   Socioeconomic History  . Marital status: Married    Spouse name: Jan  . Number of children: 3  . Years of education: 76  . Highest education level: Not on file  Occupational History  . Occupation: retired Designer, fashion/clothing  Tobacco Use  . Smoking status: Never Smoker  . Smokeless tobacco: Never Used  Vaping Use  . Vaping Use: Never used  Substance and Sexual Activity  . Alcohol use: Yes    Alcohol/week: 10.0 standard drinks    Types: 7 Standard drinks or equivalent, 3 Glasses of  wine per week  . Drug use: No  . Sexual activity: Not on file  Other Topics Concern  . Not on file  Social History Narrative   Patient is Married since 1958. Occupation: Well[employer] geologist   Lives in apartment,  Independent Living  section at [place] since 05/04/2013. Also has a home in [place], [place].   No Smoking history  Alcohol history: 5 drinks/ week   Regular exercise: 3-4 times a week , treadmill   Patient has Advanced planning documents: Living Will, HCPOA         Social Determinants of Health   Financial Resource Strain: Not on file  Food Insecurity: Not on file  Transportation Needs: Not on file  Physical Activity: Not on file  Stress: Not on file  Social Connections: Not on file  Intimate Partner Violence: Not on file     Review of Systems: General: No chills, fever, night sweats or weight changes  Cardiovascular:  No chest pain, dyspnea on exertion, edema, orthopnea, palpitations, paroxysmal nocturnal dyspnea Dermatological: No rash,  lesions or masses Respiratory: No cough, dyspnea Urologic: No hematuria, dysuria Abdominal: No nausea, vomiting, diarrhea, bright red blood per rectum, melena, or hematemesis Neurologic: No visual changes, weakness, changes in mental status All other systems reviewed and are otherwise negative except as noted above.  Physical Exam: Vitals:   05/28/20 1056  BP: (!) 96/52  Pulse: 88  SpO2: 91%  Weight: 160 lb 12.8 oz (72.9 kg)  Height: 5\' 10"  (1.778 m)    GEN- The patient is well appearing, alert and oriented x 3 today.   HEENT: normocephalic, atraumatic; sclera clear, conjunctiva pink; hearing intact; oropharynx clear; neck supple, no JVP Lymph- no cervical lymphadenopathy Lungs- Clear to ausculation bilaterally, normal work of breathing.  No wheezes, rales, rhonchi Heart- Regular rate and rhythm, no murmurs, rubs or gallops, PMI not laterally displaced GI- soft, non-tender, non-distended, bowel sounds present, no hepatosplenomegaly Extremities- no clubbing, cyanosis, or edema; DP/PT/radial pulses 2+ bilaterally MS- no significant deformity or atrophy Skin- warm and dry, no rash or lesion Psych- euthymic mood, full affect Neuro- strength and sensation are intact  EKG is not ordered. Personal review of EKG from 11/30/2019 shows sinus tach at 100 bpm  Additional studies reviewed include: Previous EP office notes   ECHOIMPRESSIONS5/15/2020  1. The left ventricle has severely reduced systolic function, with an ejection fraction of 20-25%. The cavity size was moderately dilated. Left ventricular diastolic Doppler parameters are indeterminate. There is abnormal septal motion consistent with  left bundle branch block. Left ventricular diffuse hypokinesis. 2. The right ventricle has severely reduced systolic function. The cavity was normal. There is no increase in right ventricular wall thickness. 3. A 39mm an Edwards Magna-Ease valve is present in the aortic position. Procedure  Date: 12/09/14. The leaflets are not well visualized. No evidence of prosthetic or paravalvular regurgitation. Mean systolic gradient 8 mmHg, in the setting of severely  reduced LV systolic function. 4. Left atrial size was moderately dilated. 5. Mitral valve regurgitation is moderate by color flow Doppler. 6. When compared to the prior study: Compared to exam from 06/25/16, the ejection fraction has decreased. Mitral regurgitation may have increased. Side by side comparison of images performed.  06/25/2016: LVEF 35-40% 01/10/2015: LVEF 40-45% 11/25/2014: LVEF 35-40%   11/27/2014: LHC  Mid LAD lesion, 99% stenosed.  Dist LAD lesion, 90% stenosed.  Ost 2nd Diag lesion, 90% stenosed.  Prox LAD to Mid LAD lesion, 80% stenosed.  2nd Mrg  lesion, 60% stenosed.  Ost 3rd Mrg lesion, 100% stenosed.  Prox RCA lesion, 95% stenosed.  Mid RCA lesion, 99% stenosed.  Dist RCA lesion, 100% stenosed.  1. Severe triple vessel CAD 2. Severe stenosis in the heavily calcified mid LAD  3. Chronic occlusion OM branch 4. Chronic occlusion distal RCA 5. Ischemic cardiomyopathy with normal filling pressures.  6. Likely moderately severe AS by echo (based on appearance of the valve with restricted leaflet motion, mean gradient over 71mmHg).  Recommendations: His CAD is diffuse and not favorable for PCI. He is a very active 87 yo man and despite ESRD on HD, he is functional. I will refer to CT surgery to discuss CABG with combined AVR  Assessment and Plan:  1. Chronic systolic CHF, presumably ischemic NYHA IIIb symptoms Volume status managed by dialysis Will update echo for completeness Known LBBB and poor if not prohibitive candidate for EP procedures.  Looking back at the chart, patient had very similar complaints 03/16/2019, and in that note is mentioned as having had same complaints frequent with Mrs. Tommas Olp.   2. CAD s/p CABG 2016 Denies anginal symptoms.  Prior notes have favored  conservative management strategy moving forward.  3. VHD s/p AVR 2016 Echo 05/2018 as above. Will update.  4. HTN Now with relative hypotension managed on midodrine Prohibits attempts at [place].  I discussed at length with Mr. Piechota that my primary concern is that this is an overall worsening of his overall condition. He is a DNR and re-affirms today that he is "ready for that outcome".   It is re-assuring to me that he appears to have had these dips in functional status with apparent rebounds, but to him this is an acute worsening.  We ambulated him around the clinic without drop in O2, so no increased O2 demand. He will continue to wear O2 at night.  We discussed the h/o of reduced EF and valvular disease and he would like an updated echo to help clarify his overall status.   GDMT prohibited by low pressure and renal function with K that runs high already.  Confounded further by increased BMs possibly pointing to non-specific GI illness. It's possible this may wane and patient will feel better. Recommended PCP follow up if he notices his stools are loose or dark.  We will get labwork at his next HD visit to include BMET and CBC.   RTC 2-3 months to touch base again. He understands that an Echo even if worse, will be unlikely to provide new therapeutic options given his significant co-morbidities.   Shirley Friar, PA-C  05/28/20 11:26 AM

## 2020-05-29 DIAGNOSIS — N186 End stage renal disease: Secondary | ICD-10-CM | POA: Diagnosis not present

## 2020-05-29 DIAGNOSIS — D631 Anemia in chronic kidney disease: Secondary | ICD-10-CM | POA: Diagnosis not present

## 2020-05-29 DIAGNOSIS — Z992 Dependence on renal dialysis: Secondary | ICD-10-CM | POA: Diagnosis not present

## 2020-05-29 DIAGNOSIS — N184 Chronic kidney disease, stage 4 (severe): Secondary | ICD-10-CM | POA: Diagnosis not present

## 2020-05-29 DIAGNOSIS — N2581 Secondary hyperparathyroidism of renal origin: Secondary | ICD-10-CM | POA: Diagnosis not present

## 2020-05-31 DIAGNOSIS — N184 Chronic kidney disease, stage 4 (severe): Secondary | ICD-10-CM | POA: Diagnosis not present

## 2020-05-31 DIAGNOSIS — N2581 Secondary hyperparathyroidism of renal origin: Secondary | ICD-10-CM | POA: Diagnosis not present

## 2020-05-31 DIAGNOSIS — N186 End stage renal disease: Secondary | ICD-10-CM | POA: Diagnosis not present

## 2020-05-31 DIAGNOSIS — D631 Anemia in chronic kidney disease: Secondary | ICD-10-CM | POA: Diagnosis not present

## 2020-05-31 DIAGNOSIS — Z992 Dependence on renal dialysis: Secondary | ICD-10-CM | POA: Diagnosis not present

## 2020-06-03 DIAGNOSIS — N184 Chronic kidney disease, stage 4 (severe): Secondary | ICD-10-CM | POA: Diagnosis not present

## 2020-06-03 DIAGNOSIS — N186 End stage renal disease: Secondary | ICD-10-CM | POA: Diagnosis not present

## 2020-06-03 DIAGNOSIS — D631 Anemia in chronic kidney disease: Secondary | ICD-10-CM | POA: Diagnosis not present

## 2020-06-03 DIAGNOSIS — Z992 Dependence on renal dialysis: Secondary | ICD-10-CM | POA: Diagnosis not present

## 2020-06-03 DIAGNOSIS — N2581 Secondary hyperparathyroidism of renal origin: Secondary | ICD-10-CM | POA: Diagnosis not present

## 2020-06-04 DIAGNOSIS — Z992 Dependence on renal dialysis: Secondary | ICD-10-CM | POA: Diagnosis not present

## 2020-06-04 DIAGNOSIS — N2889 Other specified disorders of kidney and ureter: Secondary | ICD-10-CM | POA: Diagnosis not present

## 2020-06-04 DIAGNOSIS — N186 End stage renal disease: Secondary | ICD-10-CM | POA: Diagnosis not present

## 2020-06-05 DIAGNOSIS — N2581 Secondary hyperparathyroidism of renal origin: Secondary | ICD-10-CM | POA: Diagnosis not present

## 2020-06-05 DIAGNOSIS — D631 Anemia in chronic kidney disease: Secondary | ICD-10-CM | POA: Diagnosis not present

## 2020-06-05 DIAGNOSIS — N186 End stage renal disease: Secondary | ICD-10-CM | POA: Diagnosis not present

## 2020-06-05 DIAGNOSIS — N184 Chronic kidney disease, stage 4 (severe): Secondary | ICD-10-CM | POA: Diagnosis not present

## 2020-06-05 DIAGNOSIS — Z992 Dependence on renal dialysis: Secondary | ICD-10-CM | POA: Diagnosis not present

## 2020-06-07 DIAGNOSIS — N186 End stage renal disease: Secondary | ICD-10-CM | POA: Diagnosis not present

## 2020-06-07 DIAGNOSIS — N184 Chronic kidney disease, stage 4 (severe): Secondary | ICD-10-CM | POA: Diagnosis not present

## 2020-06-07 DIAGNOSIS — D631 Anemia in chronic kidney disease: Secondary | ICD-10-CM | POA: Diagnosis not present

## 2020-06-07 DIAGNOSIS — N2581 Secondary hyperparathyroidism of renal origin: Secondary | ICD-10-CM | POA: Diagnosis not present

## 2020-06-07 DIAGNOSIS — Z992 Dependence on renal dialysis: Secondary | ICD-10-CM | POA: Diagnosis not present

## 2020-06-10 DIAGNOSIS — D631 Anemia in chronic kidney disease: Secondary | ICD-10-CM | POA: Diagnosis not present

## 2020-06-10 DIAGNOSIS — Z992 Dependence on renal dialysis: Secondary | ICD-10-CM | POA: Diagnosis not present

## 2020-06-10 DIAGNOSIS — N184 Chronic kidney disease, stage 4 (severe): Secondary | ICD-10-CM | POA: Diagnosis not present

## 2020-06-10 DIAGNOSIS — N186 End stage renal disease: Secondary | ICD-10-CM | POA: Diagnosis not present

## 2020-06-10 DIAGNOSIS — N2581 Secondary hyperparathyroidism of renal origin: Secondary | ICD-10-CM | POA: Diagnosis not present

## 2020-06-12 DIAGNOSIS — N186 End stage renal disease: Secondary | ICD-10-CM | POA: Diagnosis not present

## 2020-06-12 DIAGNOSIS — N184 Chronic kidney disease, stage 4 (severe): Secondary | ICD-10-CM | POA: Diagnosis not present

## 2020-06-12 DIAGNOSIS — D631 Anemia in chronic kidney disease: Secondary | ICD-10-CM | POA: Diagnosis not present

## 2020-06-12 DIAGNOSIS — Z992 Dependence on renal dialysis: Secondary | ICD-10-CM | POA: Diagnosis not present

## 2020-06-12 DIAGNOSIS — N2581 Secondary hyperparathyroidism of renal origin: Secondary | ICD-10-CM | POA: Diagnosis not present

## 2020-06-14 DIAGNOSIS — N184 Chronic kidney disease, stage 4 (severe): Secondary | ICD-10-CM | POA: Diagnosis not present

## 2020-06-14 DIAGNOSIS — N2581 Secondary hyperparathyroidism of renal origin: Secondary | ICD-10-CM | POA: Diagnosis not present

## 2020-06-14 DIAGNOSIS — Z992 Dependence on renal dialysis: Secondary | ICD-10-CM | POA: Diagnosis not present

## 2020-06-14 DIAGNOSIS — D631 Anemia in chronic kidney disease: Secondary | ICD-10-CM | POA: Diagnosis not present

## 2020-06-14 DIAGNOSIS — N186 End stage renal disease: Secondary | ICD-10-CM | POA: Diagnosis not present

## 2020-06-16 ENCOUNTER — Other Ambulatory Visit (HOSPITAL_COMMUNITY): Payer: Medicare Other

## 2020-06-17 DIAGNOSIS — Z992 Dependence on renal dialysis: Secondary | ICD-10-CM | POA: Diagnosis not present

## 2020-06-17 DIAGNOSIS — N2581 Secondary hyperparathyroidism of renal origin: Secondary | ICD-10-CM | POA: Diagnosis not present

## 2020-06-17 DIAGNOSIS — N186 End stage renal disease: Secondary | ICD-10-CM | POA: Diagnosis not present

## 2020-06-17 DIAGNOSIS — D631 Anemia in chronic kidney disease: Secondary | ICD-10-CM | POA: Diagnosis not present

## 2020-06-17 DIAGNOSIS — N184 Chronic kidney disease, stage 4 (severe): Secondary | ICD-10-CM | POA: Diagnosis not present

## 2020-06-19 DIAGNOSIS — D631 Anemia in chronic kidney disease: Secondary | ICD-10-CM | POA: Diagnosis not present

## 2020-06-19 DIAGNOSIS — N186 End stage renal disease: Secondary | ICD-10-CM | POA: Diagnosis not present

## 2020-06-19 DIAGNOSIS — Z992 Dependence on renal dialysis: Secondary | ICD-10-CM | POA: Diagnosis not present

## 2020-06-19 DIAGNOSIS — N184 Chronic kidney disease, stage 4 (severe): Secondary | ICD-10-CM | POA: Diagnosis not present

## 2020-06-19 DIAGNOSIS — N2581 Secondary hyperparathyroidism of renal origin: Secondary | ICD-10-CM | POA: Diagnosis not present

## 2020-06-21 DIAGNOSIS — N2581 Secondary hyperparathyroidism of renal origin: Secondary | ICD-10-CM | POA: Diagnosis not present

## 2020-06-21 DIAGNOSIS — Z992 Dependence on renal dialysis: Secondary | ICD-10-CM | POA: Diagnosis not present

## 2020-06-21 DIAGNOSIS — D631 Anemia in chronic kidney disease: Secondary | ICD-10-CM | POA: Diagnosis not present

## 2020-06-21 DIAGNOSIS — N184 Chronic kidney disease, stage 4 (severe): Secondary | ICD-10-CM | POA: Diagnosis not present

## 2020-06-21 DIAGNOSIS — N186 End stage renal disease: Secondary | ICD-10-CM | POA: Diagnosis not present

## 2020-06-24 DIAGNOSIS — D631 Anemia in chronic kidney disease: Secondary | ICD-10-CM | POA: Diagnosis not present

## 2020-06-24 DIAGNOSIS — N186 End stage renal disease: Secondary | ICD-10-CM | POA: Diagnosis not present

## 2020-06-24 DIAGNOSIS — N2581 Secondary hyperparathyroidism of renal origin: Secondary | ICD-10-CM | POA: Diagnosis not present

## 2020-06-24 DIAGNOSIS — Z992 Dependence on renal dialysis: Secondary | ICD-10-CM | POA: Diagnosis not present

## 2020-06-24 DIAGNOSIS — N184 Chronic kidney disease, stage 4 (severe): Secondary | ICD-10-CM | POA: Diagnosis not present

## 2020-06-25 ENCOUNTER — Ambulatory Visit (HOSPITAL_COMMUNITY): Payer: Medicare Other | Attending: Cardiology

## 2020-06-25 ENCOUNTER — Other Ambulatory Visit: Payer: Self-pay

## 2020-06-25 DIAGNOSIS — I5022 Chronic systolic (congestive) heart failure: Secondary | ICD-10-CM | POA: Diagnosis not present

## 2020-06-25 DIAGNOSIS — R0602 Shortness of breath: Secondary | ICD-10-CM | POA: Insufficient documentation

## 2020-06-25 LAB — ECHOCARDIOGRAM COMPLETE
AV Mean grad: 6 mmHg
AV Peak grad: 14.1 mmHg
Ao pk vel: 1.88 m/s
Area-P 1/2: 6.71 cm2
MV M vel: 3.99 m/s
MV Peak grad: 63.7 mmHg
Radius: 0.8 cm
S' Lateral: 6 cm

## 2020-06-25 MED ORDER — PERFLUTREN LIPID MICROSPHERE
1.0000 mL | INTRAVENOUS | Status: AC | PRN
  Administered 2020-06-25: 1 mL via INTRAVENOUS

## 2020-06-26 DIAGNOSIS — N186 End stage renal disease: Secondary | ICD-10-CM | POA: Diagnosis not present

## 2020-06-26 DIAGNOSIS — D631 Anemia in chronic kidney disease: Secondary | ICD-10-CM | POA: Diagnosis not present

## 2020-06-26 DIAGNOSIS — N184 Chronic kidney disease, stage 4 (severe): Secondary | ICD-10-CM | POA: Diagnosis not present

## 2020-06-26 DIAGNOSIS — N2581 Secondary hyperparathyroidism of renal origin: Secondary | ICD-10-CM | POA: Diagnosis not present

## 2020-06-26 DIAGNOSIS — Z992 Dependence on renal dialysis: Secondary | ICD-10-CM | POA: Diagnosis not present

## 2020-06-28 DIAGNOSIS — Z992 Dependence on renal dialysis: Secondary | ICD-10-CM | POA: Diagnosis not present

## 2020-06-28 DIAGNOSIS — N2581 Secondary hyperparathyroidism of renal origin: Secondary | ICD-10-CM | POA: Diagnosis not present

## 2020-06-28 DIAGNOSIS — N184 Chronic kidney disease, stage 4 (severe): Secondary | ICD-10-CM | POA: Diagnosis not present

## 2020-06-28 DIAGNOSIS — N186 End stage renal disease: Secondary | ICD-10-CM | POA: Diagnosis not present

## 2020-06-28 DIAGNOSIS — D631 Anemia in chronic kidney disease: Secondary | ICD-10-CM | POA: Diagnosis not present

## 2020-07-01 DIAGNOSIS — N186 End stage renal disease: Secondary | ICD-10-CM | POA: Diagnosis not present

## 2020-07-01 DIAGNOSIS — Z992 Dependence on renal dialysis: Secondary | ICD-10-CM | POA: Diagnosis not present

## 2020-07-01 DIAGNOSIS — D631 Anemia in chronic kidney disease: Secondary | ICD-10-CM | POA: Diagnosis not present

## 2020-07-01 DIAGNOSIS — N184 Chronic kidney disease, stage 4 (severe): Secondary | ICD-10-CM | POA: Diagnosis not present

## 2020-07-01 DIAGNOSIS — N2581 Secondary hyperparathyroidism of renal origin: Secondary | ICD-10-CM | POA: Diagnosis not present

## 2020-07-03 DIAGNOSIS — N184 Chronic kidney disease, stage 4 (severe): Secondary | ICD-10-CM | POA: Diagnosis not present

## 2020-07-03 DIAGNOSIS — N186 End stage renal disease: Secondary | ICD-10-CM | POA: Diagnosis not present

## 2020-07-03 DIAGNOSIS — N2581 Secondary hyperparathyroidism of renal origin: Secondary | ICD-10-CM | POA: Diagnosis not present

## 2020-07-03 DIAGNOSIS — D631 Anemia in chronic kidney disease: Secondary | ICD-10-CM | POA: Diagnosis not present

## 2020-07-03 DIAGNOSIS — Z992 Dependence on renal dialysis: Secondary | ICD-10-CM | POA: Diagnosis not present

## 2020-07-04 DIAGNOSIS — N186 End stage renal disease: Secondary | ICD-10-CM | POA: Diagnosis not present

## 2020-07-04 DIAGNOSIS — Z992 Dependence on renal dialysis: Secondary | ICD-10-CM | POA: Diagnosis not present

## 2020-07-04 DIAGNOSIS — N2889 Other specified disorders of kidney and ureter: Secondary | ICD-10-CM | POA: Diagnosis not present

## 2020-07-05 DIAGNOSIS — N186 End stage renal disease: Secondary | ICD-10-CM | POA: Diagnosis not present

## 2020-07-05 DIAGNOSIS — N2581 Secondary hyperparathyroidism of renal origin: Secondary | ICD-10-CM | POA: Diagnosis not present

## 2020-07-05 DIAGNOSIS — Z992 Dependence on renal dialysis: Secondary | ICD-10-CM | POA: Diagnosis not present

## 2020-07-05 DIAGNOSIS — D631 Anemia in chronic kidney disease: Secondary | ICD-10-CM | POA: Diagnosis not present

## 2020-07-05 DIAGNOSIS — N184 Chronic kidney disease, stage 4 (severe): Secondary | ICD-10-CM | POA: Diagnosis not present

## 2020-07-08 DIAGNOSIS — N184 Chronic kidney disease, stage 4 (severe): Secondary | ICD-10-CM | POA: Diagnosis not present

## 2020-07-08 DIAGNOSIS — N186 End stage renal disease: Secondary | ICD-10-CM | POA: Diagnosis not present

## 2020-07-08 DIAGNOSIS — D631 Anemia in chronic kidney disease: Secondary | ICD-10-CM | POA: Diagnosis not present

## 2020-07-08 DIAGNOSIS — Z992 Dependence on renal dialysis: Secondary | ICD-10-CM | POA: Diagnosis not present

## 2020-07-08 DIAGNOSIS — N2581 Secondary hyperparathyroidism of renal origin: Secondary | ICD-10-CM | POA: Diagnosis not present

## 2020-07-09 ENCOUNTER — Telehealth: Payer: Self-pay

## 2020-07-09 NOTE — Telephone Encounter (Signed)
I called pt and gave him Andy's recommendations. He agreed to keep the appt with Dr Caryl Comes and call his PCP to make an appt to make sure nothing else is going on that will cause him to be so tired.  I advised him to call back if he had any further concerns prior to his appt with Dr Caryl Comes in August.

## 2020-07-09 NOTE — Telephone Encounter (Signed)
Pt called in for his Echo results. I went over it with him and he said he is so fatigued that he can barely get up to make it to the bathroom. He has no swelling or SOB and no other symptoms. He just kept saying he was extremely tired.  Please advise on what you would like for me to tell the pt?

## 2020-07-10 DIAGNOSIS — Z992 Dependence on renal dialysis: Secondary | ICD-10-CM | POA: Diagnosis not present

## 2020-07-10 DIAGNOSIS — N184 Chronic kidney disease, stage 4 (severe): Secondary | ICD-10-CM | POA: Diagnosis not present

## 2020-07-10 DIAGNOSIS — D631 Anemia in chronic kidney disease: Secondary | ICD-10-CM | POA: Diagnosis not present

## 2020-07-10 DIAGNOSIS — N186 End stage renal disease: Secondary | ICD-10-CM | POA: Diagnosis not present

## 2020-07-10 DIAGNOSIS — N2581 Secondary hyperparathyroidism of renal origin: Secondary | ICD-10-CM | POA: Diagnosis not present

## 2020-07-11 ENCOUNTER — Ambulatory Visit (INDEPENDENT_AMBULATORY_CARE_PROVIDER_SITE_OTHER): Payer: Medicare Other | Admitting: Family

## 2020-07-11 ENCOUNTER — Other Ambulatory Visit: Payer: Self-pay

## 2020-07-11 ENCOUNTER — Encounter: Payer: Self-pay | Admitting: Family

## 2020-07-11 VITALS — BP 126/78 | HR 74 | Temp 96.9°F | Resp 18 | Ht 70.0 in | Wt 158.6 lb

## 2020-07-11 DIAGNOSIS — I2510 Atherosclerotic heart disease of native coronary artery without angina pectoris: Secondary | ICD-10-CM

## 2020-07-11 DIAGNOSIS — R5383 Other fatigue: Secondary | ICD-10-CM

## 2020-07-11 DIAGNOSIS — R0600 Dyspnea, unspecified: Secondary | ICD-10-CM | POA: Diagnosis not present

## 2020-07-11 DIAGNOSIS — R531 Weakness: Secondary | ICD-10-CM

## 2020-07-11 DIAGNOSIS — R0609 Other forms of dyspnea: Secondary | ICD-10-CM

## 2020-07-11 NOTE — Progress Notes (Signed)
Provider: Milbern Doescher FNP-C  Royal Hawthorn, NP  Patient Care Team: Royal Hawthorn, NP as PCP - General (Nurse Practitioner) Deboraha Sprang, MD as PCP - Electrophysiology (Cardiology) Jamal Maes, MD as Consulting Physician (Nephrology) Burtis Junes, NP (Inactive) as Nurse Practitioner (Nurse Practitioner) Center, [facility]  Extended Emergency Contact Information Primary Emergency Contact: [facility] Phone: [phone] Relation: Son Secondary Emergency Contact: [name],Ilene Address: [address]           [place], [place] [address] [place] Work Phone: [phone] Mobile Phone: [phone] Relation: Daughter  Code Status:  DNR  Goals of care: Advanced Directive information Advanced Directives 07/11/2020  Does Patient Have a Medical Advance Directive? Yes  Type of Advance Directive [place]  Does patient want to make changes to medical advance directive? No - Patient declined  Copy of [place] in Chart? Yes - validated most recent copy scanned in chart (See row information)  Would patient like information on creating a medical advance directive? -  Pre-existing out of facility DNR order (yellow form or pink MOST form) -     Chief Complaint  Patient presents with   Acute Visit    Patient complains of increased weakness and fatigue over the last month.     HPI:  Pt is a 87 y.o. male seen today for an acute visit for evaluation of generalized weakness and fatigue x 1 month.saw cardiologist a month ago was dx with CHF.he had an ECHO done which he states was stable. Cardiologist note from 05/28/2020 reviewed EF 20-25 % ( 05/19/2018)reduced from previous 35-40% in 6/22/20218.He was advised to follow up in 2-3 months and made aware that repeat ECHO will be unlikely to provide new therapeutic options given his significant co-morbidities.   He complains of tiredness trying to dress up.Cannot  get around at [facility] anymore due to shortness of breath on exertion. He denies any abrupt weight gain or edema.He is on dialysis on Tuesday,Thursday and Saturday. He denies any fever,chills,cough,orthopnea,PHD,palpitation,dizziness,faintness,nausea or vomiting. Has a good appetite eats mostly breakfast and small amounts for other meals.       Past Medical History:  Diagnosis Date   Anxiety    Arthritis    CHF (congestive heart failure) (HCC)    chronic systolic CHF   Complication of anesthesia    " one time my heart stopped due to a medication that I was on."    Coronary artery disease    ESRD (end stage renal disease) on dialysis ([place])    Tues, Thursday, Saturday; [facility]; [facility] (10/10/2017)   GERD (gastroesophageal reflux disease)    Gout    Heart murmur    as a teen   Herpes zoster 04/2012   Hypertension    08/28/19- now has hypotension   Left bundle branch block    Pneumonia 10/2018   Shortness of breath dyspnea    with exertion  "walking, probably due to my heart not eorking well"   Past Surgical History:  Procedure Laterality Date   AORTIC VALVE REPLACEMENT N/A 12/09/2014   Procedure: AORTIC VALVE REPLACEMENT (AVR) WITH 23MM MAGNA EASE BIOPROSTHETIC VALVE;  Surgeon: Gaye Pollack, MD;  Location: [place];  Service: Open Heart Surgery;  Laterality: N/A;   APPENDECTOMY  1944   AV FISTULA PLACEMENT Left    Left [place] AVF placed in [place] Right 03/06/2014   Procedure: ARTERIOVENOUS (AV) FISTULA CREATION-right  radiocephalic;  Surgeon: Mal Misty, MD;  Location: [place];  Service: Vascular;  Laterality: Right;   AV FISTULA PLACEMENT Right 07/15/2014   Procedure: ARTERIOVENOUS (AV) FISTULA CREATION;  Surgeon: Elam Dutch, MD;  Location: [place];  Service: Vascular;  Laterality: Right;   CARDIAC CATHETERIZATION N/A 11/27/2014   Procedure: Right/Left Heart Cath and Coronary Angiography;  Surgeon: Burnell Blanks, MD;   Location: [place] [facility];  Service: Cardiovascular;  Laterality: N/A;   CATARACT EXTRACTION W/ INTRAOCULAR LENS IMPLANT Bilateral 2014   [facility]   COLONOSCOPY  2013   Dr. Annamaria Helling [place], [place].   CORONARY ARTERY BYPASS GRAFT N/A 12/09/2014   Procedure: CORONARY ARTERY BYPASS GRAFTING (CABG), ON PUMP, TIMES THREE, USING LEFT INTERNAL MAMMARY ARTERY, RIGHT GREATER SAPHENOUS VEIN HARVESTED ENDOSCOPICALLY;  Surgeon: Gaye Pollack, MD;  Location: [place];  Service: Open Heart Surgery;  Laterality: N/A;  LIMA-LAD; SVG-OM; SVG-PD   DIALYSIS FISTULA CREATION  08/04/2012 and 10/13   Dr. Harden Mo   FISTULA SUPERFICIALIZATION Right 08/29/2019   Procedure: RIGHT ARTERIOVENOUS FISTULA  PLICATION;  Surgeon: Serafina Mitchell, MD;  Location[facility];  Service: Vascular;  Laterality: Right;   FISTULOGRAM Right 04/12/2019   Procedure: FISTULOGRAM with Balloon Venoplasty of Right Peripheral Vein;  Surgeon: Serafina Mitchell, MD;  Location: [place];  Service: Vascular;  Laterality: Right;   FISTULOGRAM Right 08/29/2019   Procedure: FISTULOGRAM;  Surgeon: Serafina Mitchell, MD;  Location: [facility] OR;  Service: Vascular;  Laterality: Right;   INSERTION OF DIALYSIS CATHETER Right 01-15-14   Right chest TDC placed by Dr. Augustin Coupe at [facility]   LIGATION OF ARTERIOVENOUS  FISTULA Right 07/15/2014   Procedure: LIGATION OF ARTERIOVENOUS  FISTULA  (RIGHT RADIOCEPHALIC);  Surgeon: Elam Dutch, MD;  Location: [place];  Service: Vascular;  Laterality: Right;   LIGATION OF COMPETING BRANCHES OF ARTERIOVENOUS FISTULA Right 05/08/2014   Procedure: LIGATION OF COMPETING BRANCHES OF RIGHT ARM RADIOCEPHALIC ARTERIOVENOUS FISTULA;  Surgeon: Mal Misty, MD;  Location: [place];  Service: Vascular;  Laterality: Right;   RESECTION OF ARTERIOVENOUS FISTULA ANEURYSM Right 08/29/2019   Procedure: RESECTION OF ARTERIOVENOUS FISTULA ANEURYSM;  Surgeon: Serafina Mitchell, MD;  Location: [place];  Service: Vascular;  Laterality: Right;    REVISON OF ARTERIOVENOUS FISTULA Right 07/15/2014   Procedure: EXPLORATION OF ARTERIOVENOUS FISTULA;  Surgeon: Elam Dutch, MD;  Location: [place];  Service: Vascular;  Laterality: Right;   REVISON OF ARTERIOVENOUS FISTULA Right 04/12/2019   Procedure: REVISON OF ARTERIOVENOUS FISTULA;  Surgeon: Serafina Mitchell, MD;  Location: [facility];  Service: Vascular;  Laterality: Right;   TEE WITHOUT CARDIOVERSION N/A 12/09/2014   Procedure: TRANSESOPHAGEAL ECHOCARDIOGRAM (TEE);  Surgeon: Gaye Pollack, MD;  Location: [place];  Service: Open Heart Surgery;  Laterality: N/A;   VENOPLASTY Right 08/29/2019   Procedure: PERIPHERAL VENOPLASTY;  Surgeon: Serafina Mitchell, MD;  Location: [facility];  Service: Vascular;  Laterality: Right;    No Known Allergies  Outpatient Encounter Medications as of 07/11/2020  Medication Sig   acetaminophen (TYLENOL) 500 MG tablet Take 1,000 mg by mouth 3 (three) times daily as needed for moderate pain.    allopurinol (ZYLOPRIM) 100 MG tablet TAKE 1 TABLET BY MOUTH DAILY   atorvastatin (LIPITOR) 20 MG tablet TAKE 1 TABLET BY MOUTH DAILY FOR CHOLESTEROL   calcium carbonate (TUMS EX) 750 MG chewable tablet Chew 1,500 mg by mouth 3 (three) times daily with meals.  cinacalcet (SENSIPAR) 60 MG tablet Take 60 mg by mouth daily.    midodrine (PROAMATINE) 10 MG tablet Take 1 tablet (10 mg total) by mouth 3 (three) times daily. OVERDUE FOR FOLLOW UP. PLEASE CALL AND SCHEDULE - 1ST ATTEMPT   omeprazole (PRILOSEC) 20 MG capsule Take 20 mg by mouth daily.    No facility-administered encounter medications on file as of 07/11/2020.    Review of Systems  Constitutional:  Negative for appetite change, chills, fatigue, fever and unexpected weight change.  HENT:  Negative for congestion, dental problem, ear discharge, ear pain, facial swelling, hearing loss, nosebleeds, postnasal drip, rhinorrhea, sinus pressure, sinus pain, sneezing, sore throat, tinnitus and trouble swallowing.   Eyes:  Negative for  pain, discharge, redness, itching and visual disturbance.  Respiratory:  Negative for cough, chest tightness and wheezing.        Report shortness of breath with exertion especially doing his ADL's.    Cardiovascular:  Negative for chest pain, palpitations and leg swelling.  Gastrointestinal:  Negative for abdominal distention, abdominal pain, blood in stool, constipation, diarrhea, nausea and vomiting.  Endocrine: Negative for cold intolerance, heat intolerance, polydipsia, polyphagia and polyuria.  Genitourinary:        ESRD on HD on Tues,Thur and Sat   Musculoskeletal:  Positive for gait problem. Negative for arthralgias, back pain, joint swelling, myalgias, neck pain and neck stiffness.  Skin:  Negative for color change, pallor, rash and wound.  Neurological:  Negative for dizziness, syncope, speech difficulty, weakness, light-headedness, numbness and headaches.  Hematological:  Does not bruise/bleed easily.  Psychiatric/Behavioral:  Negative for agitation, behavioral problems, confusion, hallucinations, self-injury, sleep disturbance and suicidal ideas. The patient is not nervous/anxious.    Immunization History  Administered Date(s) Administered   Hepatitis B, adult 06/02/2013, 07/03/2013, 08/02/2013   Influenza, High Dose Seasonal PF 10/02/2017, 09/21/2018, 11/02/2019   Influenza-Unspecified 10/04/2012, 10/05/2015, 10/04/2016, 09/05/2018   Moderna Sars-Covid-2 Vaccination 01/06/2019, 02/13/2019, 11/20/2019   Pneumococcal Conjugate-13 04/30/2015   Pneumococcal Polysaccharide-23 06/04/2008   Tdap 06/01/2017   Zoster, Live 01/05/2011   Pertinent  Health Maintenance Due  Topic Date Due   INFLUENZA VACCINE  08/04/2020   PNA vac Low Risk Adult  Completed   Fall Risk  07/11/2020 05/05/2020 03/25/2020 12/05/2019 12/03/2019  Falls in the past year? 0 0 1 0 1  Number falls in past yr: 0 0 1 0 0  Injury with Fall? 0 - 0 0 0  Risk for fall due to : No Fall Risks - - - -  Follow up Falls  evaluation completed - - - -   Functional Status Survey:    Vitals:   07/11/20 0942  BP: 126/78  Pulse: 74  Resp: 18  Temp: (!) 96.9 F (36.1 C)  SpO2: 97%  Weight: 158 lb 9.6 oz (71.9 kg)  Height: 5\' 10"  (1.778 m)   Body mass index is 22.76 kg/m. Physical Exam Vitals reviewed.  Constitutional:      General: He is not in acute distress.    Appearance: Normal appearance. He is normal weight. He is not ill-appearing or diaphoretic.  HENT:     Head: Normocephalic.     Right Ear: Tympanic membrane, ear canal and external ear normal. There is no impacted cerumen.     Left Ear: Tympanic membrane, ear canal and external ear normal. There is no impacted cerumen.     Nose: Nose normal. No congestion or rhinorrhea.     Mouth/Throat:     Mouth: Mucous  membranes are moist.     Pharynx: Oropharynx is clear. No oropharyngeal exudate or posterior oropharyngeal erythema.  Eyes:     General: No scleral icterus.       Right eye: No discharge.        Left eye: No discharge.     Extraocular Movements: Extraocular movements intact.     Conjunctiva/sclera: Conjunctivae normal.     Pupils: Pupils are equal, round, and reactive to light.     Comments: Corrective lens in place   Neck:     Vascular: No carotid bruit.  Cardiovascular:     Rate and Rhythm: Normal rate and regular rhythm.     Pulses: Normal pulses.     Heart sounds: Murmur heard.    No friction rub. No gallop.  Pulmonary:     Effort: Pulmonary effort is normal. No respiratory distress.     Breath sounds: Normal breath sounds. No wheezing, rhonchi or rales.  Chest:     Chest wall: No tenderness.  Abdominal:     General: Bowel sounds are normal. There is no distension.     Palpations: Abdomen is soft. There is no mass.     Tenderness: There is no abdominal tenderness. There is no right CVA tenderness, left CVA tenderness, guarding or rebound.  Musculoskeletal:        General: No swelling or tenderness.     Cervical back:  Normal range of motion. No rigidity or tenderness.     Right lower leg: No edema.     Left lower leg: No edema.     Comments: Unsteady gait   Lymphadenopathy:     Cervical: No cervical adenopathy.  Skin:    General: Skin is warm and dry.     Coloration: Skin is not pale.     Findings: No bruising, erythema, lesion or rash.  Neurological:     Mental Status: He is alert and oriented to person, place, and time.     Motor: No weakness.     Gait: Gait abnormal.  Psychiatric:        Mood and Affect: Mood normal.        Speech: Speech normal.        Behavior: Behavior normal.        Thought Content: Thought content normal.        Judgment: Judgment normal.    Labs reviewed: Recent Labs    08/29/19 0953 11/30/19 0555  NA 138 135  K 4.3 5.8*  CL 97* 93*  CO2  --  27  GLUCOSE 110* 104*  BUN 43* 48*  CREATININE 7.20* 6.77*  CALCIUM  --  8.8*   No results for input(s): AST, ALT, ALKPHOS, BILITOT, PROT, ALBUMIN in the last 8760 hours. Recent Labs    08/29/19 0953 11/30/19 0339  WBC  --  10.4  NEUTROABS  --  8.1*  HGB 12.2* 10.8*  HCT 36.0* 33.4*  MCV  --  108.1*  PLT  --  226   No results found for: TSH Lab Results  Component Value Date   HGBA1C 5.2 12/06/2014   Lab Results  Component Value Date   CHOL 125 02/07/2017   HDL 37 (L) 02/07/2017   LDLCALC 72 02/07/2017   TRIG 80 02/07/2017   CHOLHDL 3.4 02/07/2017    Significant Diagnostic Results in last 30 days:  ECHOCARDIOGRAM COMPLETE  Result Date: 06/25/2020    ECHOCARDIOGRAM REPORT   Patient Name:   JAQUANN GUARISCO Date of Exam: 06/25/2020  Medical Rec #:  [id number]     Height:       70.0 in Accession #:    [id number]    Weight:       160.8 lb Date of Birth:  1930-07-20     BSA:          1.902 m Patient Age:    67 years      BP:           96/52 mmHg Patient Gender: M             HR:           88 bpm. Exam Location:  [address] Procedure: 2D Echo, Cardiac Doppler, Color Doppler and Intracardiac             Opacification Agent Indications:    I50.22 CHF  History:        Patient has prior history of Echocardiogram examinations, most                 recent 05/19/2018. Cardiomyopathy and CHF, CAD, Prior CABG,                 Aortic Valve Disease, Arrythmias:LBBB,                 Signs/Symptoms:Hypotension; Risk Factors:Hypertension and                 Dyslipidemia. ESRD. Anemia. EtOH.                 Aortic Valve: 23 mm Edwards Magna-Ease valve is present in the                 aortic position. Procedure Date: 12/09/14.  Sonographer:    Jessee Avers, RDCS Referring Phys: [id number] [place]  1. Left ventricular ejection fraction, by estimation, is <20%. The left ventricle has severely decreased function. The left ventricle demonstrates global hypokinesis. The left ventricular internal cavity size was moderately dilated. There is mild left ventricular hypertrophy. Left ventricular diastolic parameters are consistent with Grade II diastolic dysfunction (pseudonormalization). Elevated left atrial pressure.  2. Right ventricular systolic function is severely reduced. The right ventricular size is moderately enlarged. There is mildly elevated pulmonary artery systolic pressure. The estimated right ventricular systolic pressure is 74.0 mmHg.  3. Left atrial size was mildly dilated.  4. Right atrial size was mildly dilated.  5. The mitral valve is abnormal. Moderate to severe mitral valve regurgitation. Eccentric anterior directed MR jet, appears moderate but may be underestimating MR severity due to eccentric jet  6. There is a 23 mm Edwards Magna-Ease valve present in the aortic position. Aortic valve regurgitation is not visualized. MG 16mmHg in setting of severe LV systolic dysfunction. DI 0.37.  7. The inferior vena cava is dilated in size with >50% respiratory variability, suggesting right atrial pressure of 8 mmHg. Comparison(s): 05/19/18 EF 20-25%. FINDINGS  Left Ventricle: Left ventricular ejection  fraction, by estimation, is <20%. The left ventricle has severely decreased function. The left ventricle demonstrates global hypokinesis. The left ventricular internal cavity size was moderately dilated. There is mild left ventricular hypertrophy. Left ventricular diastolic parameters are consistent with Grade II diastolic dysfunction (pseudonormalization). Elevated left atrial pressure. Right Ventricle: The right ventricular size is moderately enlarged. Right vetricular wall thickness was not well visualized. Right ventricular systolic function is severely reduced. There is mildly elevated pulmonary artery systolic pressure. The tricuspid regurgitant velocity is 2.79 m/s, and with an assumed right  atrial pressure of 8 mmHg, the estimated right ventricular systolic pressure is 44.9 mmHg. Left Atrium: Left atrial size was mildly dilated. Right Atrium: Right atrial size was mildly dilated. Pericardium: There is no evidence of pericardial effusion. Mitral Valve: The mitral valve is abnormal. Moderate to severe mitral valve regurgitation. Tricuspid Valve: The tricuspid valve is normal in structure. Tricuspid valve regurgitation is mild. Aortic Valve: The aortic valve has been repaired/replaced. Aortic valve regurgitation is not visualized. Aortic valve mean gradient measures 6.0 mmHg. Aortic valve peak gradient measures 14.1 mmHg. There is a 23 mm Edwards Magna-Ease valve present in the  aortic position. Procedure Date: 12/09/14. Pulmonic Valve: The pulmonic valve was not well visualized. Pulmonic valve regurgitation is mild to moderate. Aorta: The aortic root and ascending aorta are structurally normal, with no evidence of dilitation. Venous: The inferior vena cava is dilated in size with greater than 50% respiratory variability, suggesting right atrial pressure of 8 mmHg. IAS/Shunts: The interatrial septum was not well visualized.  LEFT VENTRICLE PLAX 2D LVIDd:         6.50 cm Diastology LVIDs:         6.00 cm LV e'  medial:    4.58 cm/s LV PW:         1.30 cm LV E/e' medial:  24.7 LV IVS:        1.10 cm LV e' lateral:   5.63 cm/s                        LV E/e' lateral: 20.1  RIGHT VENTRICLE RV Basal diam:  4.30 cm RV S prime:     5.48 cm/s TAPSE (M-mode): 1.4 cm RVSP:           39.1 mmHg LEFT ATRIUM             Index       RIGHT ATRIUM           Index LA diam:        5.10 cm 2.68 cm/m  RA Pressure: 8.00 mmHg LA Vol (A2C):   89.3 ml 46.94 ml/m RA Area:     22.60 cm LA Vol (A4C):   60.0 ml 31.54 ml/m RA Volume:   69.30 ml  36.43 ml/m LA Biplane Vol: 76.6 ml 40.26 ml/m  AORTIC VALVE AV Vmax:           187.50 cm/s AV Vmean:          106.550 cm/s AV VTI:            0.299 m AV Peak Grad:      14.1 mmHg AV Mean Grad:      6.0 mmHg LVOT Vmax:         68.40 cm/s LVOT Vmean:        45.600 cm/s LVOT VTI:          0.112 m LVOT/AV VTI ratio: 0.38  AORTA Ao Root diam: 3.80 cm Ao Asc diam:  3.30 cm MITRAL VALVE                 TRICUSPID VALVE                              TR Peak grad:   31.1 mmHg  TR Vmax:        279.00 cm/s MR Peak grad:    63.7 mmHg   Estimated RAP:  8.00 mmHg MR Mean grad:    39.0 mmHg   RVSP:           39.1 mmHg MR Vmax:         399.00 cm/s MR Vmean:        292.0 cm/s  SHUNTS MR PISA:         4.02 cm    Systemic VTI: 0.11 m MR PISA Eff ROA: 34 mm MR PISA Radius:  0.80 cm MV E velocity: 113.00 cm/s MV A velocity: 112.00 cm/s MV E/A ratio:  1.01 Oswaldo Milian MD Electronically signed by Oswaldo Milian MD Signature Date/Time: 06/25/2020/3:48:04 PM    Final     Assessment/Plan 1. Generalized weakness Afebrile.ambulating without assist device during visit. Unclear etiology but will rule out other acute issues /thyroid though suspect this related to his ongoing congestive Heart failure and ESRD. - TSH - CBC with Differential/Platelet  2. Fatigue, unspecified type Chronic possible due to CHF and ESRD as above.  - TSH - CBC with Differential/Platelet  3. Dyspnea on  exertion No signs of fluid overload on exam. On Hemodialysis on Tuesday,Thursday and Saturday - Brain Natriuretic Peptide - continue to follow up with Cardiologist as directed  Family/ staff Communication: Reviewed plan of care with patient and wife verbalized understanding   Labs/tests ordered:  - Brain Natriuretic Peptide - TSH - CBC with Differential/Platelet  Next Appointment: Has  appointment 09/10/2020 with Dr.Gupta at [place], NP

## 2020-07-11 NOTE — Patient Instructions (Signed)
-   lab done today will call you with results

## 2020-07-12 DIAGNOSIS — D631 Anemia in chronic kidney disease: Secondary | ICD-10-CM | POA: Diagnosis not present

## 2020-07-12 DIAGNOSIS — N184 Chronic kidney disease, stage 4 (severe): Secondary | ICD-10-CM | POA: Diagnosis not present

## 2020-07-12 DIAGNOSIS — Z992 Dependence on renal dialysis: Secondary | ICD-10-CM | POA: Diagnosis not present

## 2020-07-12 DIAGNOSIS — N186 End stage renal disease: Secondary | ICD-10-CM | POA: Diagnosis not present

## 2020-07-12 DIAGNOSIS — N2581 Secondary hyperparathyroidism of renal origin: Secondary | ICD-10-CM | POA: Diagnosis not present

## 2020-07-12 LAB — CBC WITH DIFFERENTIAL/PLATELET
Absolute Monocytes: 900 cells/uL (ref 200–950)
Basophils Absolute: 68 cells/uL (ref 0–200)
Basophils Relative: 0.9 %
Eosinophils Absolute: 405 cells/uL (ref 15–500)
Eosinophils Relative: 5.4 %
HCT: 37 % — ABNORMAL LOW (ref 38.5–50.0)
Hemoglobin: 12.4 g/dL — ABNORMAL LOW (ref 13.2–17.1)
Lymphs Abs: 630 cells/uL — ABNORMAL LOW (ref 850–3900)
MCH: 35.5 pg — ABNORMAL HIGH (ref 27.0–33.0)
MCHC: 33.5 g/dL (ref 32.0–36.0)
MCV: 106 fL — ABNORMAL HIGH (ref 80.0–100.0)
MPV: 11.3 fL (ref 7.5–12.5)
Monocytes Relative: 12 %
Neutro Abs: 5498 cells/uL (ref 1500–7800)
Neutrophils Relative %: 73.3 %
Platelets: 173 10*3/uL (ref 140–400)
RBC: 3.49 10*6/uL — ABNORMAL LOW (ref 4.20–5.80)
RDW: 14.2 % (ref 11.0–15.0)
Total Lymphocyte: 8.4 %
WBC: 7.5 10*3/uL (ref 3.8–10.8)

## 2020-07-12 LAB — TSH: TSH: 1.06 mIU/L (ref 0.40–4.50)

## 2020-07-12 LAB — BRAIN NATRIURETIC PEPTIDE: Brain Natriuretic Peptide: >4200 pg/mL — ABNORMAL HIGH (ref <100)

## 2020-07-15 ENCOUNTER — Telehealth: Payer: Self-pay | Admitting: *Deleted

## 2020-07-15 DIAGNOSIS — N184 Chronic kidney disease, stage 4 (severe): Secondary | ICD-10-CM | POA: Diagnosis not present

## 2020-07-15 DIAGNOSIS — D631 Anemia in chronic kidney disease: Secondary | ICD-10-CM | POA: Diagnosis not present

## 2020-07-15 DIAGNOSIS — Z992 Dependence on renal dialysis: Secondary | ICD-10-CM | POA: Diagnosis not present

## 2020-07-15 DIAGNOSIS — N2581 Secondary hyperparathyroidism of renal origin: Secondary | ICD-10-CM | POA: Diagnosis not present

## 2020-07-15 DIAGNOSIS — N186 End stage renal disease: Secondary | ICD-10-CM | POA: Diagnosis not present

## 2020-07-15 NOTE — Telephone Encounter (Signed)
Patient called and stated that he had labwork done last week 07/11/2020 and he never received the results. Wants to know the results of his bloodwork.  Please Advise.

## 2020-07-15 NOTE — Telephone Encounter (Signed)
Had one results that was still pending but now resulted.

## 2020-07-16 NOTE — Telephone Encounter (Signed)
Nelda Bucks Ngetich, NP  07/15/2020  8:36 PM EDT Back to Top     - Thyroid level is normal. Hemoglobin slightly low indicates anemia but has improved compared to previouslevel. - Bn- peptide level was high due to fluid but labs was done on none dialysisday.continue on hemodialysis to manage fluid overload ( CHF).      Patient notified and agreed.

## 2020-07-17 DIAGNOSIS — N186 End stage renal disease: Secondary | ICD-10-CM | POA: Diagnosis not present

## 2020-07-17 DIAGNOSIS — Z992 Dependence on renal dialysis: Secondary | ICD-10-CM | POA: Diagnosis not present

## 2020-07-17 DIAGNOSIS — N184 Chronic kidney disease, stage 4 (severe): Secondary | ICD-10-CM | POA: Diagnosis not present

## 2020-07-17 DIAGNOSIS — N2581 Secondary hyperparathyroidism of renal origin: Secondary | ICD-10-CM | POA: Diagnosis not present

## 2020-07-17 DIAGNOSIS — D631 Anemia in chronic kidney disease: Secondary | ICD-10-CM | POA: Diagnosis not present

## 2020-07-19 DIAGNOSIS — N184 Chronic kidney disease, stage 4 (severe): Secondary | ICD-10-CM | POA: Diagnosis not present

## 2020-07-19 DIAGNOSIS — Z992 Dependence on renal dialysis: Secondary | ICD-10-CM | POA: Diagnosis not present

## 2020-07-19 DIAGNOSIS — N2581 Secondary hyperparathyroidism of renal origin: Secondary | ICD-10-CM | POA: Diagnosis not present

## 2020-07-19 DIAGNOSIS — N186 End stage renal disease: Secondary | ICD-10-CM | POA: Diagnosis not present

## 2020-07-19 DIAGNOSIS — D631 Anemia in chronic kidney disease: Secondary | ICD-10-CM | POA: Diagnosis not present

## 2020-07-22 DIAGNOSIS — N184 Chronic kidney disease, stage 4 (severe): Secondary | ICD-10-CM | POA: Diagnosis not present

## 2020-07-22 DIAGNOSIS — N2581 Secondary hyperparathyroidism of renal origin: Secondary | ICD-10-CM | POA: Diagnosis not present

## 2020-07-22 DIAGNOSIS — Z992 Dependence on renal dialysis: Secondary | ICD-10-CM | POA: Diagnosis not present

## 2020-07-22 DIAGNOSIS — N186 End stage renal disease: Secondary | ICD-10-CM | POA: Diagnosis not present

## 2020-07-22 DIAGNOSIS — D631 Anemia in chronic kidney disease: Secondary | ICD-10-CM | POA: Diagnosis not present

## 2020-07-24 DIAGNOSIS — Z992 Dependence on renal dialysis: Secondary | ICD-10-CM | POA: Diagnosis not present

## 2020-07-24 DIAGNOSIS — D631 Anemia in chronic kidney disease: Secondary | ICD-10-CM | POA: Diagnosis not present

## 2020-07-24 DIAGNOSIS — N2581 Secondary hyperparathyroidism of renal origin: Secondary | ICD-10-CM | POA: Diagnosis not present

## 2020-07-24 DIAGNOSIS — N184 Chronic kidney disease, stage 4 (severe): Secondary | ICD-10-CM | POA: Diagnosis not present

## 2020-07-24 DIAGNOSIS — N186 End stage renal disease: Secondary | ICD-10-CM | POA: Diagnosis not present

## 2020-07-26 DIAGNOSIS — D631 Anemia in chronic kidney disease: Secondary | ICD-10-CM | POA: Diagnosis not present

## 2020-07-26 DIAGNOSIS — Z992 Dependence on renal dialysis: Secondary | ICD-10-CM | POA: Diagnosis not present

## 2020-07-26 DIAGNOSIS — N184 Chronic kidney disease, stage 4 (severe): Secondary | ICD-10-CM | POA: Diagnosis not present

## 2020-07-26 DIAGNOSIS — N186 End stage renal disease: Secondary | ICD-10-CM | POA: Diagnosis not present

## 2020-07-26 DIAGNOSIS — N2581 Secondary hyperparathyroidism of renal origin: Secondary | ICD-10-CM | POA: Diagnosis not present

## 2020-07-29 DIAGNOSIS — D631 Anemia in chronic kidney disease: Secondary | ICD-10-CM | POA: Diagnosis not present

## 2020-07-29 DIAGNOSIS — Z992 Dependence on renal dialysis: Secondary | ICD-10-CM | POA: Diagnosis not present

## 2020-07-29 DIAGNOSIS — N2581 Secondary hyperparathyroidism of renal origin: Secondary | ICD-10-CM | POA: Diagnosis not present

## 2020-07-29 DIAGNOSIS — N184 Chronic kidney disease, stage 4 (severe): Secondary | ICD-10-CM | POA: Diagnosis not present

## 2020-07-29 DIAGNOSIS — N186 End stage renal disease: Secondary | ICD-10-CM | POA: Diagnosis not present

## 2020-07-31 DIAGNOSIS — N2581 Secondary hyperparathyroidism of renal origin: Secondary | ICD-10-CM | POA: Diagnosis not present

## 2020-07-31 DIAGNOSIS — N186 End stage renal disease: Secondary | ICD-10-CM | POA: Diagnosis not present

## 2020-07-31 DIAGNOSIS — N184 Chronic kidney disease, stage 4 (severe): Secondary | ICD-10-CM | POA: Diagnosis not present

## 2020-07-31 DIAGNOSIS — D631 Anemia in chronic kidney disease: Secondary | ICD-10-CM | POA: Diagnosis not present

## 2020-07-31 DIAGNOSIS — Z992 Dependence on renal dialysis: Secondary | ICD-10-CM | POA: Diagnosis not present

## 2020-08-02 DIAGNOSIS — N2581 Secondary hyperparathyroidism of renal origin: Secondary | ICD-10-CM | POA: Diagnosis not present

## 2020-08-02 DIAGNOSIS — N186 End stage renal disease: Secondary | ICD-10-CM | POA: Diagnosis not present

## 2020-08-02 DIAGNOSIS — N184 Chronic kidney disease, stage 4 (severe): Secondary | ICD-10-CM | POA: Diagnosis not present

## 2020-08-02 DIAGNOSIS — Z992 Dependence on renal dialysis: Secondary | ICD-10-CM | POA: Diagnosis not present

## 2020-08-02 DIAGNOSIS — D631 Anemia in chronic kidney disease: Secondary | ICD-10-CM | POA: Diagnosis not present

## 2020-08-04 DIAGNOSIS — Z992 Dependence on renal dialysis: Secondary | ICD-10-CM | POA: Diagnosis not present

## 2020-08-04 DIAGNOSIS — N2889 Other specified disorders of kidney and ureter: Secondary | ICD-10-CM | POA: Diagnosis not present

## 2020-08-04 DIAGNOSIS — N186 End stage renal disease: Secondary | ICD-10-CM | POA: Diagnosis not present

## 2020-08-05 DIAGNOSIS — N184 Chronic kidney disease, stage 4 (severe): Secondary | ICD-10-CM | POA: Diagnosis not present

## 2020-08-05 DIAGNOSIS — N186 End stage renal disease: Secondary | ICD-10-CM | POA: Diagnosis not present

## 2020-08-05 DIAGNOSIS — N2581 Secondary hyperparathyroidism of renal origin: Secondary | ICD-10-CM | POA: Diagnosis not present

## 2020-08-05 DIAGNOSIS — Z992 Dependence on renal dialysis: Secondary | ICD-10-CM | POA: Diagnosis not present

## 2020-08-05 DIAGNOSIS — D631 Anemia in chronic kidney disease: Secondary | ICD-10-CM | POA: Diagnosis not present

## 2020-08-07 DIAGNOSIS — Z992 Dependence on renal dialysis: Secondary | ICD-10-CM | POA: Diagnosis not present

## 2020-08-07 DIAGNOSIS — N186 End stage renal disease: Secondary | ICD-10-CM | POA: Diagnosis not present

## 2020-08-07 DIAGNOSIS — N184 Chronic kidney disease, stage 4 (severe): Secondary | ICD-10-CM | POA: Diagnosis not present

## 2020-08-07 DIAGNOSIS — N2581 Secondary hyperparathyroidism of renal origin: Secondary | ICD-10-CM | POA: Diagnosis not present

## 2020-08-07 DIAGNOSIS — D631 Anemia in chronic kidney disease: Secondary | ICD-10-CM | POA: Diagnosis not present

## 2020-08-09 DIAGNOSIS — N2581 Secondary hyperparathyroidism of renal origin: Secondary | ICD-10-CM | POA: Diagnosis not present

## 2020-08-09 DIAGNOSIS — Z992 Dependence on renal dialysis: Secondary | ICD-10-CM | POA: Diagnosis not present

## 2020-08-09 DIAGNOSIS — N186 End stage renal disease: Secondary | ICD-10-CM | POA: Diagnosis not present

## 2020-08-09 DIAGNOSIS — D631 Anemia in chronic kidney disease: Secondary | ICD-10-CM | POA: Diagnosis not present

## 2020-08-09 DIAGNOSIS — N184 Chronic kidney disease, stage 4 (severe): Secondary | ICD-10-CM | POA: Diagnosis not present

## 2020-08-12 DIAGNOSIS — D631 Anemia in chronic kidney disease: Secondary | ICD-10-CM | POA: Diagnosis not present

## 2020-08-12 DIAGNOSIS — N186 End stage renal disease: Secondary | ICD-10-CM | POA: Diagnosis not present

## 2020-08-12 DIAGNOSIS — Z992 Dependence on renal dialysis: Secondary | ICD-10-CM | POA: Diagnosis not present

## 2020-08-12 DIAGNOSIS — N184 Chronic kidney disease, stage 4 (severe): Secondary | ICD-10-CM | POA: Diagnosis not present

## 2020-08-12 DIAGNOSIS — N2581 Secondary hyperparathyroidism of renal origin: Secondary | ICD-10-CM | POA: Diagnosis not present

## 2020-08-14 DIAGNOSIS — Z992 Dependence on renal dialysis: Secondary | ICD-10-CM | POA: Diagnosis not present

## 2020-08-14 DIAGNOSIS — N186 End stage renal disease: Secondary | ICD-10-CM | POA: Diagnosis not present

## 2020-08-14 DIAGNOSIS — D631 Anemia in chronic kidney disease: Secondary | ICD-10-CM | POA: Diagnosis not present

## 2020-08-14 DIAGNOSIS — N2581 Secondary hyperparathyroidism of renal origin: Secondary | ICD-10-CM | POA: Diagnosis not present

## 2020-08-14 DIAGNOSIS — N184 Chronic kidney disease, stage 4 (severe): Secondary | ICD-10-CM | POA: Diagnosis not present

## 2020-08-16 DIAGNOSIS — D631 Anemia in chronic kidney disease: Secondary | ICD-10-CM | POA: Diagnosis not present

## 2020-08-16 DIAGNOSIS — N2581 Secondary hyperparathyroidism of renal origin: Secondary | ICD-10-CM | POA: Diagnosis not present

## 2020-08-16 DIAGNOSIS — N184 Chronic kidney disease, stage 4 (severe): Secondary | ICD-10-CM | POA: Diagnosis not present

## 2020-08-16 DIAGNOSIS — N186 End stage renal disease: Secondary | ICD-10-CM | POA: Diagnosis not present

## 2020-08-16 DIAGNOSIS — Z992 Dependence on renal dialysis: Secondary | ICD-10-CM | POA: Diagnosis not present

## 2020-08-19 DIAGNOSIS — D631 Anemia in chronic kidney disease: Secondary | ICD-10-CM | POA: Diagnosis not present

## 2020-08-19 DIAGNOSIS — Z992 Dependence on renal dialysis: Secondary | ICD-10-CM | POA: Diagnosis not present

## 2020-08-19 DIAGNOSIS — N186 End stage renal disease: Secondary | ICD-10-CM | POA: Diagnosis not present

## 2020-08-19 DIAGNOSIS — N2581 Secondary hyperparathyroidism of renal origin: Secondary | ICD-10-CM | POA: Diagnosis not present

## 2020-08-19 DIAGNOSIS — N184 Chronic kidney disease, stage 4 (severe): Secondary | ICD-10-CM | POA: Diagnosis not present

## 2020-08-21 DIAGNOSIS — Z992 Dependence on renal dialysis: Secondary | ICD-10-CM | POA: Diagnosis not present

## 2020-08-21 DIAGNOSIS — D631 Anemia in chronic kidney disease: Secondary | ICD-10-CM | POA: Diagnosis not present

## 2020-08-21 DIAGNOSIS — N184 Chronic kidney disease, stage 4 (severe): Secondary | ICD-10-CM | POA: Diagnosis not present

## 2020-08-21 DIAGNOSIS — N186 End stage renal disease: Secondary | ICD-10-CM | POA: Diagnosis not present

## 2020-08-21 DIAGNOSIS — N2581 Secondary hyperparathyroidism of renal origin: Secondary | ICD-10-CM | POA: Diagnosis not present

## 2020-08-21 NOTE — Progress Notes (Incomplete)
Electrophysiology Office Note   Date:  08/21/2020   ID:  Eric [name], DOB 11-18-1930, MRN [id number]  Location: patient's home  Provider location: [address] [place]  Evaluation Performed: Follow-up visit  PCP:  Royal Hawthorn, NP  Cardiologist:   LG/SK    Chief Complaint: Fatigue  History of Present Illness:    Eric [name] is a 87 y.o. male who presents for follow up.  Since last being seen in our clinic for coronary artery disease with prior bypass surgery and aortic valve bioprosthesis with depressed LV function in the setting of chronic kidney disease on hemodialysis, the patient reports gradually worsening fatigue over the last 4 years, and by fatigue remains exercise intolerance.  His measure of thickness however dates back to prior to his heart valve surgery.  He is not sure that there is any change in functional status over the last 6 months although he thinks there may be some over the last 12 months.  He has some nocturnal dyspnea on the long intradialytic interval i.e. Monday night.  Infrequent post dialysis lightheadedness.  No intradialytic lightheadedness and blood pressures prior to dialysis are typically 176-160 systolic  No chest pains.  No edema and no palpitations  Today,  The patient denies chest pain***, shortness of breath***, nocturnal dyspnea***, orthopnea*** or peripheral edema***.  There have been no palpitations***, lightheadedness*** or syncope***.  Complains of ***.   DATE TEST EF   11/16 Echo 35-40%   6/18 Echo   *35-40 %   5/20 Echo 20-25% MR Mod  6/22 Echo <20% MR Mod-Severe   Date Cr K Hgb  10/19   4.0 10.4  11/21 6.77 5.8    7/22   12.4     Past Medical History:  Diagnosis Date   Anxiety    Arthritis    CHF (congestive heart failure) (HCC)    chronic systolic CHF   Complication of anesthesia    " one time my heart stopped due to a medication that I was on."    Coronary artery disease    ESRD (end stage  renal disease) on dialysis ([place])    Tues, Thursday, Saturday; [facility]; [facility] (10/10/2017)   GERD (gastroesophageal reflux disease)    Gout    Heart murmur    as a teen   Herpes zoster 04/2012   Hypertension    08/28/19- now has hypotension   Left bundle branch block    Pneumonia 10/2018   Shortness of breath dyspnea    with exertion  "walking, probably due to my heart not eorking well"    Past Surgical History:  Procedure Laterality Date   AORTIC VALVE REPLACEMENT N/A 12/09/2014   Procedure: AORTIC VALVE REPLACEMENT (AVR) WITH 23MM MAGNA EASE BIOPROSTHETIC VALVE;  Surgeon: Gaye Pollack, MD;  Location: [place];  Service: Open Heart Surgery;  Laterality: N/A;   APPENDECTOMY  1944   AV FISTULA PLACEMENT Left    Left Cim[place] AVF placed in [place] Right 03/06/2014   Procedure: ARTERIOVENOUS (AV) FISTULA CREATION-right radiocephalic;  Surgeon: Mal Misty, MD;  Location: [place];  Service: Vascular;  Laterality: Right;   AV FISTULA PLACEMENT Right 07/15/2014   Procedure: ARTERIOVENOUS (AV) FISTULA CREATION;  Surgeon: Elam Dutch, MD;  Location: [place];  Service: Vascular;  Laterality: Right;   CARDIAC CATHETERIZATION N/A 11/27/2014   Procedure: Right/Left Heart Cath and Coronary Angiography;  Surgeon: Burnell Blanks,  MD;  Location: [place] [facility];  Service: Cardiovascular;  Laterality: N/A;   CATARACT EXTRACTION W/ INTRAOCULAR LENS IMPLANT Bilateral 2014   [facility]   COLONOSCOPY  2013   Dr. Annamaria Helling [place], [place].   CORONARY ARTERY BYPASS GRAFT N/A 12/09/2014   Procedure: CORONARY ARTERY BYPASS GRAFTING (CABG), ON PUMP, TIMES THREE, USING LEFT INTERNAL MAMMARY ARTERY, RIGHT GREATER SAPHENOUS VEIN HARVESTED ENDOSCOPICALLY;  Surgeon: Gaye Pollack, MD;  Location: [place];  Service: Open Heart Surgery;  Laterality: N/A;  LIMA-LAD; SVG-OM; SVG-PD   DIALYSIS FISTULA CREATION  08/04/2012 and 10/13   Dr. Harden Mo   FISTULA  SUPERFICIALIZATION Right 08/29/2019   Procedure: RIGHT ARTERIOVENOUS FISTULA  PLICATION;  Surgeon: Serafina Mitchell, MD;  Location: [facility];  Service: Vascular;  Laterality: Right;   FISTULOGRAM Right 04/12/2019   Procedure: FISTULOGRAM with Balloon Venoplasty of Right Peripheral Vein;  Surgeon: Serafina Mitchell, MD;  Location: [place];  Service: Vascular;  Laterality: Right;   FISTULOGRAM Right 08/29/2019   Procedure: FISTULOGRAM;  Surgeon: Serafina Mitchell, MD;  Location[facility];  Service: Vascular;  Laterality: Right;   INSERTION OF DIALYSIS CATHETER Right 01-15-14   Right chest TDC placed by Dr. Augustin Coupe at [facility]   LIGATION OF ARTERIOVENOUS  FISTULA Right 07/15/2014   Procedure: LIGATION OF ARTERIOVENOUS  FISTULA  (RIGHT RADIOCEPHALIC);  Surgeon: Elam Dutch, MD;  Location: [place];  Service: Vascular;  Laterality: Right;   LIGATION OF COMPETING BRANCHES OF ARTERIOVENOUS FISTULA Right 05/08/2014   Procedure: LIGATION OF COMPETING BRANCHES OF RIGHT ARM RADIOCEPHALIC ARTERIOVENOUS FISTULA;  Surgeon: Mal Misty, MD;  Location: [place];  Service: Vascular;  Laterality: Right;   RESECTION OF ARTERIOVENOUS FISTULA ANEURYSM Right 08/29/2019   Procedure: RESECTION OF ARTERIOVENOUS FISTULA ANEURYSM;  Surgeon: Serafina Mitchell, MD;  Location: [place];  Service: Vascular;  Laterality: Right;   REVISON OF ARTERIOVENOUS FISTULA Right 07/15/2014   Procedure: EXPLORATION OF ARTERIOVENOUS FISTULA;  Surgeon: Elam Dutch, MD;  Location: [place];  Service: Vascular;  Laterality: Right;   REVISON OF ARTERIOVENOUS FISTULA Right 04/12/2019   Procedure: REVISON OF ARTERIOVENOUS FISTULA;  Surgeon: Serafina Mitchell, MD;  Location: [facility];  Service: Vascular;  Laterality: Right;   TEE WITHOUT CARDIOVERSION N/A 12/09/2014   Procedure: TRANSESOPHAGEAL ECHOCARDIOGRAM (TEE);  Surgeon: Gaye Pollack, MD;  Location: [place];  Service: Open Heart Surgery;  Laterality: N/A;   VENOPLASTY Right 08/29/2019   Procedure: PERIPHERAL VENOPLASTY;   Surgeon: Serafina Mitchell, MD;  Location: [facility];  Service: Vascular;  Laterality: Right;    Current Outpatient Medications  Medication Sig Dispense Refill   acetaminophen (TYLENOL) 500 MG tablet Take 1,000 mg by mouth 3 (three) times daily as needed for moderate pain.      allopurinol (ZYLOPRIM) 100 MG tablet TAKE 1 TABLET BY MOUTH DAILY 90 tablet 3   atorvastatin (LIPITOR) 20 MG tablet TAKE 1 TABLET BY MOUTH DAILY FOR CHOLESTEROL 90 tablet 3   calcium carbonate (TUMS EX) 750 MG chewable tablet Chew 1,500 mg by mouth 3 (three) times daily with meals.     cinacalcet (SENSIPAR) 60 MG tablet Take 60 mg by mouth daily.      midodrine (PROAMATINE) 10 MG tablet Take 1 tablet (10 mg total) by mouth 3 (three) times daily. OVERDUE FOR FOLLOW UP. PLEASE CALL AND SCHEDULE - 1ST ATTEMPT 90 tablet 0   omeprazole (PRILOSEC) 20 MG capsule Take 20 mg by mouth daily.  No current facility-administered medications for this visit.    Allergies:   Patient has no known allergies.   Social History:  The patient  reports that he has never smoked. He has never used smokeless tobacco. He reports current alcohol use of about 10.0 standard drinks per week. He reports that he does not use drugs.   Family History:  The patient's   family history includes Cancer in his mother; Diabetes in his father; Lung cancer (age of onset: 61) in his father.   ROS:  Please see the history of present illness.   All other systems are personally reviewed and negative.    Exam:    Vital Signs:  There were no vitals taken for this visit.    Well developed and nourished in no acute distress HENT normal Neck supple with JVP-  flat *** Clear Regular rate and rhythm, no murmurs or gallops Abd-soft with active BS No Clubbing cyanosis edema Skin-warm and dry A & Oriented  Grossly normal sensory and motor function  ECG ***    Labs/Other Tests and Data Reviewed:    Recent Labs: 11/30/2019: BUN 48; Creatinine, Ser 6.77;  Potassium 5.8; Sodium 135 07/11/2020: Brain Natriuretic Peptide >4,200; Hemoglobin 12.4; Platelets 173; TSH 1.06   Wt Readings from Last 3 Encounters:  07/11/20 158 lb 9.6 oz (71.9 kg)  05/28/20 160 lb 12.8 oz (72.9 kg)  05/05/20 145 lb 6.4 oz (66 kg)     Other studies personally reviewed: Additional studies/ records that were reviewed today include: As above    ASSESSMENT & PLAN:    LBBB   ICM  S/p CABG--AVR   Hemodialysis   Hypertension   Anemia  Congestive heart failure-chronic-systolic   The patient has modest exercise intolerance in the context of moderate LV dysfunction.  There has been a gradual change in LV function post following his surgery and a gradual decrease in exercise tolerance.  The fact that the left bundle branch block occurred at the time of surgery suggest that much of his cardiomyopathy is likely unrelated to the left bundle branch block; this would have some impact on likelihood of improvement.  2 things good me significant pause however about recommending CRT-P implantation.  The first is the above; the second is end-stage renal disease which increases the risk of infection of the device and potentially secondarily of his valve.  Given the fact that there has not been a significant change in exercise tolerance, we discussed the approach of being very aggressive and implanting or being cautious and observing, he is in favor of the latter.  He and I are in agreement.  We will change his Lopressor to carvedilol given his benefits in cardiomyopathy.  I will reach out to Dr. Donley Redder to see whether he is a candidate for any other guideline directed therapy for his cardiomyopathy.     Current medicines are reviewed at length with the patient today.   The patient does not have concerns regarding his medicines.  The following changes were made today:  Stop metoprolol Begin carvedilol 3.125 bid  Labs/ tests ordered today include:   No orders of the defined types were  placed in this encounter.      I,Mathew Stumpf,acting as a scribe for Virl Axe, MD.,have documented all relevant documentation on the behalf of Virl Axe, MD,as directed by  Virl Axe, MD while in the presence of Virl Axe, MD.  ***  Signed, Madelin Rear  08/21/2020 4:45 PM  [place]  [phone] (office) [phone] (fax)

## 2020-08-22 ENCOUNTER — Ambulatory Visit: Payer: Medicare Other | Admitting: Internal Medicine

## 2020-08-22 ENCOUNTER — Other Ambulatory Visit: Payer: Self-pay | Admitting: *Deleted

## 2020-08-22 DIAGNOSIS — I5020 Unspecified systolic (congestive) heart failure: Secondary | ICD-10-CM

## 2020-08-22 DIAGNOSIS — I428 Other cardiomyopathies: Secondary | ICD-10-CM

## 2020-08-22 MED ORDER — ATORVASTATIN CALCIUM 20 MG PO TABS: ORAL_TABLET | ORAL | 3 refills | Status: AC

## 2020-08-22 MED ORDER — ALLOPURINOL 100 MG PO TABS: 100.0000 mg | ORAL_TABLET | Freq: Every day | ORAL | 3 refills | Status: AC

## 2020-08-22 NOTE — Telephone Encounter (Signed)
Patient and son called requesting refills to be sent to [facility].

## 2020-08-23 DIAGNOSIS — D631 Anemia in chronic kidney disease: Secondary | ICD-10-CM | POA: Diagnosis not present

## 2020-08-23 DIAGNOSIS — N184 Chronic kidney disease, stage 4 (severe): Secondary | ICD-10-CM | POA: Diagnosis not present

## 2020-08-23 DIAGNOSIS — N186 End stage renal disease: Secondary | ICD-10-CM | POA: Diagnosis not present

## 2020-08-23 DIAGNOSIS — Z992 Dependence on renal dialysis: Secondary | ICD-10-CM | POA: Diagnosis not present

## 2020-08-23 DIAGNOSIS — N2581 Secondary hyperparathyroidism of renal origin: Secondary | ICD-10-CM | POA: Diagnosis not present

## 2020-08-25 ENCOUNTER — Emergency Department (HOSPITAL_BASED_OUTPATIENT_CLINIC_OR_DEPARTMENT_OTHER): Payer: Medicare Other

## 2020-08-25 ENCOUNTER — Other Ambulatory Visit: Payer: Self-pay

## 2020-08-25 ENCOUNTER — Encounter (HOSPITAL_BASED_OUTPATIENT_CLINIC_OR_DEPARTMENT_OTHER): Payer: Self-pay | Admitting: Obstetrics and Gynecology

## 2020-08-25 ENCOUNTER — Inpatient Hospital Stay (HOSPITAL_BASED_OUTPATIENT_CLINIC_OR_DEPARTMENT_OTHER)
Admission: EM | Admit: 2020-08-25 | Discharge: 2020-08-29 | DRG: 177 | Disposition: A | Discharge status: Home Health | Payer: Medicare Other | Attending: Internal Medicine | Admitting: Internal Medicine

## 2020-08-25 DIAGNOSIS — J811 Chronic pulmonary edema: Secondary | ICD-10-CM | POA: Diagnosis not present

## 2020-08-25 DIAGNOSIS — Z9981 Dependence on supplemental oxygen: Secondary | ICD-10-CM

## 2020-08-25 DIAGNOSIS — M109 Gout, unspecified: Secondary | ICD-10-CM | POA: Diagnosis present

## 2020-08-25 DIAGNOSIS — E875 Hyperkalemia: Secondary | ICD-10-CM | POA: Diagnosis present

## 2020-08-25 DIAGNOSIS — R5383 Other fatigue: Secondary | ICD-10-CM | POA: Diagnosis not present

## 2020-08-25 DIAGNOSIS — D631 Anemia in chronic kidney disease: Secondary | ICD-10-CM | POA: Diagnosis present

## 2020-08-25 DIAGNOSIS — I2510 Atherosclerotic heart disease of native coronary artery without angina pectoris: Secondary | ICD-10-CM | POA: Diagnosis present

## 2020-08-25 DIAGNOSIS — U071 COVID-19: Secondary | ICD-10-CM | POA: Diagnosis present

## 2020-08-25 DIAGNOSIS — I5022 Chronic systolic (congestive) heart failure: Secondary | ICD-10-CM | POA: Diagnosis present

## 2020-08-25 DIAGNOSIS — K219 Gastro-esophageal reflux disease without esophagitis: Secondary | ICD-10-CM | POA: Diagnosis present

## 2020-08-25 DIAGNOSIS — N186 End stage renal disease: Secondary | ICD-10-CM | POA: Diagnosis present

## 2020-08-25 DIAGNOSIS — J9621 Acute and chronic respiratory failure with hypoxia: Secondary | ICD-10-CM | POA: Diagnosis present

## 2020-08-25 DIAGNOSIS — I5042 Chronic combined systolic (congestive) and diastolic (congestive) heart failure: Secondary | ICD-10-CM | POA: Diagnosis present

## 2020-08-25 DIAGNOSIS — R0602 Shortness of breath: Secondary | ICD-10-CM | POA: Diagnosis present

## 2020-08-25 DIAGNOSIS — Z951 Presence of aortocoronary bypass graft: Secondary | ICD-10-CM | POA: Diagnosis not present

## 2020-08-25 DIAGNOSIS — Z807 Family history of other malignant neoplasms of lymphoid, hematopoietic and related tissues: Secondary | ICD-10-CM | POA: Diagnosis not present

## 2020-08-25 DIAGNOSIS — J1282 Pneumonia due to coronavirus disease 2019: Secondary | ICD-10-CM | POA: Diagnosis present

## 2020-08-25 DIAGNOSIS — M1990 Unspecified osteoarthritis, unspecified site: Secondary | ICD-10-CM | POA: Diagnosis present

## 2020-08-25 DIAGNOSIS — Z801 Family history of malignant neoplasm of trachea, bronchus and lung: Secondary | ICD-10-CM | POA: Diagnosis not present

## 2020-08-25 DIAGNOSIS — Z833 Family history of diabetes mellitus: Secondary | ICD-10-CM

## 2020-08-25 DIAGNOSIS — I447 Left bundle-branch block, unspecified: Secondary | ICD-10-CM | POA: Diagnosis present

## 2020-08-25 DIAGNOSIS — Z992 Dependence on renal dialysis: Secondary | ICD-10-CM | POA: Diagnosis not present

## 2020-08-25 DIAGNOSIS — M898X9 Other specified disorders of bone, unspecified site: Secondary | ICD-10-CM | POA: Diagnosis present

## 2020-08-25 DIAGNOSIS — E785 Hyperlipidemia, unspecified: Secondary | ICD-10-CM | POA: Diagnosis present

## 2020-08-25 DIAGNOSIS — Z66 Do not resuscitate: Secondary | ICD-10-CM | POA: Diagnosis present

## 2020-08-25 DIAGNOSIS — J9811 Atelectasis: Secondary | ICD-10-CM | POA: Diagnosis present

## 2020-08-25 DIAGNOSIS — I517 Cardiomegaly: Secondary | ICD-10-CM | POA: Diagnosis not present

## 2020-08-25 DIAGNOSIS — N250 Renal osteodystrophy: Secondary | ICD-10-CM | POA: Diagnosis not present

## 2020-08-25 DIAGNOSIS — Z953 Presence of xenogenic heart valve: Secondary | ICD-10-CM | POA: Diagnosis not present

## 2020-08-25 DIAGNOSIS — I132 Hypertensive heart and chronic kidney disease with heart failure and with stage 5 chronic kidney disease, or end stage renal disease: Secondary | ICD-10-CM | POA: Diagnosis present

## 2020-08-25 DIAGNOSIS — R531 Weakness: Secondary | ICD-10-CM

## 2020-08-25 DIAGNOSIS — J9601 Acute respiratory failure with hypoxia: Secondary | ICD-10-CM | POA: Diagnosis not present

## 2020-08-25 DIAGNOSIS — I6789 Other cerebrovascular disease: Secondary | ICD-10-CM | POA: Diagnosis not present

## 2020-08-25 LAB — CBC
HCT: 39.1 % (ref 39.0–52.0)
Hemoglobin: 12.8 g/dL — ABNORMAL LOW (ref 13.0–17.0)
MCH: 34.9 pg — ABNORMAL HIGH (ref 26.0–34.0)
MCHC: 32.7 g/dL (ref 30.0–36.0)
MCV: 106.5 fL — ABNORMAL HIGH (ref 80.0–100.0)
Platelets: 151 10*3/uL (ref 150–400)
RBC: 3.67 MIL/uL — ABNORMAL LOW (ref 4.22–5.81)
RDW: 16.1 % — ABNORMAL HIGH (ref 11.5–15.5)
WBC: 5.4 10*3/uL (ref 4.0–10.5)
nRBC: 0 % (ref 0.0–0.2)

## 2020-08-25 LAB — BASIC METABOLIC PANEL
Anion gap: 20 — ABNORMAL HIGH (ref 5–15)
BUN: 71 mg/dL — ABNORMAL HIGH (ref 8–23)
CO2: 26 mmol/L (ref 22–32)
Calcium: 10.1 mg/dL (ref 8.9–10.3)
Chloride: 91 mmol/L — ABNORMAL LOW (ref 98–111)
Creatinine, Ser: 7.09 mg/dL — ABNORMAL HIGH (ref 0.61–1.24)
GFR, Estimated: 7 mL/min — ABNORMAL LOW (ref >60)
Glucose, Bld: 165 mg/dL — ABNORMAL HIGH (ref 70–99)
Potassium: 5.3 mmol/L — ABNORMAL HIGH (ref 3.5–5.1)
Sodium: 137 mmol/L (ref 135–145)

## 2020-08-25 LAB — BRAIN NATRIURETIC PEPTIDE: B Natriuretic Peptide: >4500 pg/mL — ABNORMAL HIGH (ref 0.0–100.0)

## 2020-08-25 LAB — RESP PANEL BY RT-PCR (FLU A&B, COVID) ARPGX2
Influenza A by PCR: NEGATIVE
Influenza B by PCR: NEGATIVE
SARS Coronavirus 2 by RT PCR: POSITIVE — AB

## 2020-08-25 LAB — D-DIMER, QUANTITATIVE: D-Dimer, Quant: 1.45 ug/mL-FEU — ABNORMAL HIGH (ref 0.00–0.50)

## 2020-08-25 LAB — FERRITIN: Ferritin: 416 ng/mL — ABNORMAL HIGH (ref 24–336)

## 2020-08-25 LAB — TSH: TSH: 1.117 u[IU]/mL (ref 0.350–4.500)

## 2020-08-25 LAB — C-REACTIVE PROTEIN: CRP: 1.7 mg/dL — ABNORMAL HIGH (ref <1.0)

## 2020-08-25 LAB — PROCALCITONIN: Procalcitonin: 0.2 ng/mL

## 2020-08-25 MED ORDER — MIDODRINE HCL 5 MG PO TABS
10.0000 mg | ORAL_TABLET | Freq: Three times a day (TID) | ORAL | Status: DC
  Administered 2020-08-26 – 2020-08-29 (×10): 10 mg via ORAL
  Filled 2020-08-25 (×11): qty 2

## 2020-08-25 MED ORDER — DEXAMETHASONE SODIUM PHOSPHATE 10 MG/ML IJ SOLN
6.0000 mg | Freq: Once | INTRAMUSCULAR | Status: AC
  Administered 2020-08-25: 6 mg via INTRAVENOUS
  Filled 2020-08-25: qty 1

## 2020-08-25 MED ORDER — CINACALCET HCL 30 MG PO TABS
60.0000 mg | ORAL_TABLET | Freq: Every day | ORAL | Status: DC
  Administered 2020-08-26 – 2020-08-29 (×3): 60 mg via ORAL
  Filled 2020-08-25 (×4): qty 2

## 2020-08-25 MED ORDER — ALBUTEROL SULFATE HFA 108 (90 BASE) MCG/ACT IN AERS
1.0000 | INHALATION_SPRAY | RESPIRATORY_TRACT | Status: DC | PRN
  Filled 2020-08-25: qty 6.7

## 2020-08-25 MED ORDER — ACETAMINOPHEN 650 MG RE SUPP: 650.0000 mg | Freq: Four times a day (QID) | RECTAL | Status: DC | PRN

## 2020-08-25 MED ORDER — ACETAMINOPHEN 325 MG PO TABS: 650.0000 mg | ORAL_TABLET | Freq: Four times a day (QID) | ORAL | Status: DC | PRN

## 2020-08-25 MED ORDER — ATORVASTATIN CALCIUM 10 MG PO TABS
20.0000 mg | ORAL_TABLET | Freq: Every day | ORAL | Status: DC
  Administered 2020-08-25 – 2020-08-29 (×4): 20 mg via ORAL
  Filled 2020-08-25 (×5): qty 2

## 2020-08-25 MED ORDER — SODIUM CHLORIDE 0.9 % IV SOLN
100.0000 mg | Freq: Every day | INTRAVENOUS | Status: AC
  Administered 2020-08-26 – 2020-08-29 (×4): 100 mg via INTRAVENOUS
  Filled 2020-08-25: qty 20
  Filled 2020-08-25 (×3): qty 100

## 2020-08-25 MED ORDER — DEXAMETHASONE 6 MG PO TABS: 6.0000 mg | ORAL_TABLET | ORAL | Status: DC

## 2020-08-25 MED ORDER — SODIUM CHLORIDE 0.9 % IV SOLN
200.0000 mg | Freq: Once | INTRAVENOUS | Status: AC
  Administered 2020-08-25: 200 mg via INTRAVENOUS
  Filled 2020-08-25: qty 40

## 2020-08-25 MED ORDER — ALLOPURINOL 100 MG PO TABS
100.0000 mg | ORAL_TABLET | Freq: Every day | ORAL | Status: DC
  Administered 2020-08-26 – 2020-08-29 (×3): 100 mg via ORAL
  Filled 2020-08-25 (×4): qty 1

## 2020-08-25 MED ORDER — PANTOPRAZOLE SODIUM 40 MG PO TBEC
40.0000 mg | DELAYED_RELEASE_TABLET | Freq: Every day | ORAL | Status: DC
  Administered 2020-08-26 – 2020-08-29 (×3): 40 mg via ORAL
  Filled 2020-08-25: qty 1
  Filled 2020-08-25: qty 2
  Filled 2020-08-25 (×2): qty 1

## 2020-08-25 MED ORDER — CALCIUM CARBONATE ANTACID 500 MG PO CHEW
1500.0000 mg | CHEWABLE_TABLET | Freq: Three times a day (TID) | ORAL | Status: DC
  Administered 2020-08-26 – 2020-08-29 (×9): 1500 mg via ORAL
  Filled 2020-08-25 (×6): qty 8

## 2020-08-25 MED ORDER — DEXAMETHASONE 6 MG PO TABS
6.0000 mg | ORAL_TABLET | ORAL | Status: DC
  Administered 2020-08-26 – 2020-08-28 (×3): 6 mg via ORAL
  Filled 2020-08-25 (×5): qty 1

## 2020-08-25 NOTE — ED Notes (Signed)
Patient placed on 3 L [place] per O2 sats of 86% on RA.

## 2020-08-25 NOTE — H&P (Signed)
History and Physical    PLEASE NOTE THAT DRAGON DICTATION SOFTWARE WAS USED IN THE CONSTRUCTION OF THIS NOTE.   Eric [name] WGY:[id number] DOB: 1930-09-09 DOA: 08/25/2020  PCP: Royal Hawthorn, NP Patient coming from: home   I have personally briefly reviewed patient's old medical records in [place]  Chief Complaint: Shortness of breath  HPI: Eric [name] is a 87 y.o. male with medical history significant for chronic systolic/diastolic heart failure, end-stage renal disease on hemodialysis, hyperlipidemia, who is admitted to [facility] on 08/25/2020 as transfer from [place] with severe COVID-19 infection after presenting from home to the latter facility complaining of shortness of breath.   The patient reports to 3 days of progressive shortness of breath associated with new onset nonproductive cough, subjective fever. Denies any associated orthopnea, PND, or new onset peripheral edema. No recent chest pain, diaphoresis, palpitations, N/V, pre-syncope, or syncope. Not associated with any recent wheezing, mopped assist, new lower extremity erythema, or calf tenderness.  Denies any recent trauma, travel, surgical procedures, or periods of prolonged admission ambulatory status.  No recent melena or hematochezia.  Denies associated full body rigors or chills.  No recent headache, neck stiffness, abdominal pain, diarrhea, rash.    The patient also reports associated generalized weakness over the last 2 to 3 days, and denies any associated acute focal weakness, acute focal numbness, paresthesias, slurred speech, facial droop, acute change in vision, dysphagia, or vertigo.  In the setting of this generalized weakness, the patient reports that he experienced a ground-level mechanical fall at home which time he tripped, falling to the ground below, without hitting his head as a component of this fall.  Denies any associated loss of consciousness.  Denies any significant ensuing  arthralgias or myalgias.  The patient denies any known chronic underlying pulmonary pathology and conveys that he is a lifelong non-smoker.  No baseline supplemental oxygen requirements.  He confirms a history of end-stage renal disease on hemodialysis on Tuesday, Thursday, Saturday schedule noting good compliance and no recent missed hemodialysis sessions.  Most recent completed dialysis session occurred on Saturday, with the patient confirming that he will be due for his next routine scheduled hemodialysis session on Tuesday, 08/26/2020.   Medical history also notable for chronic combined systolic/diastolic heart failure, with echocardiogram on 06/25/2020 showing LVEF less than 20% with global hypokinesis, moderately dilated left jugular cavity size, mild LVH, grade 2 diastolic dysfunction, mildly dilated bilateral atria, and moderate to severe mitral regurgitation.  Denies any history of underlying diabetes.    [facility] ED Course:  Vital signs in the ED were notable for the following: Tetramex 98, heart rate 85-92, respiratory rate 17-25; blood pressure 103/67; initial oxygen saturation noted to be 89% on room air, with ensuing improvement to 96% on 3 L nasal cannula.  Labs were notable for the following: BMP notable for the following: Sodium 137, bicarbonate 26, anion gap 20.  CBC notable for white cell count 5400, hemoglobin 12.8.  CRP, ferritin, D-dimer, and procalcitonin were all checked at [facility], with associated results currently pending.  COVID-19 PCR performed at [facility] today found to be positive.  EKG, in comparison to most recent prior from November 2021 showed sinus rhythm, heart rate 87, nonspecific interventricular conduction delay, nonspecific T wave inversion in aVL, but otherwise no evidence of interval T wave or ST changes, including no evidence of ST elevation.  Chest x-ray shows stable cardiomegaly with mild central pulmonary vascular congestion mild bibasilar subsegmental  atelectasis, and no evidence  of infiltrate, effusion, or pneumothorax.  In the setting of his ground-level mechanical fall as an outpatient CT head showed no evidence of acute intracranial process, including no evidence of intracranial hemorrhage.  While in the ED, the following were administered: Decadron 6 mg IV x1     Review of Systems: As per HPI otherwise 10 point review of systems negative.   Past Medical History:  Diagnosis Date   Anxiety    Arthritis    CHF (congestive heart failure) (HCC)    chronic systolic CHF   Complication of anesthesia    " one time my heart stopped due to a medication that I was on."    Coronary artery disease    ESRD (end stage renal disease) on dialysis ([place])    Tues, Thursday, Saturday; [facility]; [facility] (10/10/2017)   GERD (gastroesophageal reflux disease)    Gout    Heart murmur    as a teen   Herpes zoster 04/2012   Hypertension    08/28/19- now has hypotension   Left bundle branch block    Pneumonia 10/2018   Shortness of breath dyspnea    with exertion  "walking, probably due to my heart not eorking well"    Past Surgical History:  Procedure Laterality Date   AORTIC VALVE REPLACEMENT N/A 12/09/2014   Procedure: AORTIC VALVE REPLACEMENT (AVR) WITH 23MM MAGNA EASE BIOPROSTHETIC VALVE;  Surgeon: Gaye Pollack, MD;  Location: [place];  Service: Open Heart Surgery;  Laterality: N/A;   APPENDECTOMY  1944   AV FISTULA PLACEMENT Left    Left Cimino AVF placed in [place] Right 03/06/2014   Procedure: ARTERIOVENOUS (AV) FISTULA CREATION-right radiocephalic;  Surgeon: Mal Misty, MD;  Location: [place];  Service: Vascular;  Laterality: Right;   AV FISTULA PLACEMENT Right 07/15/2014   Procedure: ARTERIOVENOUS (AV) FISTULA CREATION;  Surgeon: Elam Dutch, MD;  Location: [place];  Service: Vascular;  Laterality: Right;   CARDIAC CATHETERIZATION N/A 11/27/2014   Procedure: Right/Left Heart Cath and Coronary  Angiography;  Surgeon: Burnell Blanks, MD;  Location: [place] CV LAB;  Service: Cardiovascular;  Laterality: N/A;   CATARACT EXTRACTION W/ INTRAOCULAR LENS IMPLANT Bilateral 2014   [facility]   COLONOSCOPY  2013   Dr. Annamaria Helling [place], [place].   CORONARY ARTERY BYPASS GRAFT N/A 12/09/2014   Procedure: CORONARY ARTERY BYPASS GRAFTING (CABG), ON PUMP, TIMES THREE, USING LEFT INTERNAL MAMMARY ARTERY, RIGHT GREATER SAPHENOUS VEIN HARVESTED ENDOSCOPICALLY;  Surgeon: Gaye Pollack, MD;  Location: [place];  Service: Open Heart Surgery;  Laterality: N/A;  LIMA-LAD; SVG-OM; SVG-PD   DIALYSIS FISTULA CREATION  08/04/2012 and 10/13   Dr. Harden Mo   FISTULA SUPERFICIALIZATION Right 08/29/2019   Procedure: RIGHT ARTERIOVENOUS FISTULA  PLICATION;  Surgeon: Serafina Mitchell, MD;  Location: [facility] OR;  Service: Vascular;  Laterality: Right;   FISTULOGRAM Right 04/12/2019   Procedure: FISTULOGRAM with Balloon Venoplasty of Right Peripheral Vein;  Surgeon: Serafina Mitchell, MD;  Location: [place];  Service: Vascular;  Laterality: Right;   FISTULOGRAM Right 08/29/2019   Procedure: FISTULOGRAM;  Surgeon: Serafina Mitchell, MD;  Location: [facility] OR;  Service: Vascular;  Laterality: Right;   INSERTION OF DIALYSIS CATHETER Right 01-15-14   Right chest TDC placed by Dr. Augustin Coupe at [facility]   LIGATION OF ARTERIOVENOUS  FISTULA Right 07/15/2014   Procedure: LIGATION OF ARTERIOVENOUS  FISTULA  (RIGHT RADIOCEPHALIC);  Surgeon: Juanda Crumble  Antony Blackbird, MD;  Location: [place];  Service: Vascular;  Laterality: Right;   LIGATION OF COMPETING BRANCHES OF ARTERIOVENOUS FISTULA Right 05/08/2014   Procedure: LIGATION OF COMPETING BRANCHES OF RIGHT ARM RADIOCEPHALIC ARTERIOVENOUS FISTULA;  Surgeon: Mal Misty, MD;  Location: [place];  Service: Vascular;  Laterality: Right;   RESECTION OF ARTERIOVENOUS FISTULA ANEURYSM Right 08/29/2019   Procedure: RESECTION OF ARTERIOVENOUS FISTULA ANEURYSM;  Surgeon: Serafina Mitchell, MD;  Location: [place];  Service: Vascular;  Laterality: Right;   REVISON OF ARTERIOVENOUS FISTULA Right 07/15/2014   Procedure: EXPLORATION OF ARTERIOVENOUS FISTULA;  Surgeon: Elam Dutch, MD;  Location: [place];  Service: Vascular;  Laterality: Right;   REVISON OF ARTERIOVENOUS FISTULA Right 04/12/2019   Procedure: REVISON OF ARTERIOVENOUS FISTULA;  Surgeon: Serafina Mitchell, MD;  Location: [facility] OR;  Service: Vascular;  Laterality: Right;   TEE WITHOUT CARDIOVERSION N/A 12/09/2014   Procedure: TRANSESOPHAGEAL ECHOCARDIOGRAM (TEE);  Surgeon: Gaye Pollack, MD;  Location: [place];  Service: Open Heart Surgery;  Laterality: N/A;   VENOPLASTY Right 08/29/2019   Procedure: PERIPHERAL VENOPLASTY;  Surgeon: Serafina Mitchell, MD;  Location: [facility] OR;  Service: Vascular;  Laterality: Right;    Social History:  reports that he has never smoked. He has never used smokeless tobacco. He reports current alcohol use of about 10.0 standard drinks per week. He reports that he does not use drugs.   No Known Allergies  Family History  Problem Relation Age of Onset   Diabetes Father    Lung cancer Father 24       non-smoker   Cancer Mother        multiple myeloma   Colon cancer Neg Hx     Family history reviewed and not pertinent    Prior to Admission medications   Medication Sig Start Date End Date Taking? Authorizing Provider  acetaminophen (TYLENOL) 500 MG tablet Take 1,000 mg by mouth 3 (three) times daily as needed for moderate pain.     [provider]  allopurinol (ZYLOPRIM) 100 MG tablet Take 1 tablet (100 mg total) by mouth daily. 08/22/20   Royal Hawthorn, NP  atorvastatin (LIPITOR) 20 MG tablet TAKE 1 TABLET BY MOUTH DAILY FOR CHOLESTEROL 08/22/20   Royal Hawthorn, NP  calcium carbonate (TUMS EX) 750 MG chewable tablet Chew 1,500 mg by mouth 3 (three) times daily with meals.    [provider]  cinacalcet (SENSIPAR) 60 MG tablet Take 60 mg by mouth daily.     [provider]  midodrine  (PROAMATINE) 10 MG tablet Take 1 tablet (10 mg total) by mouth 3 (three) times daily. OVERDUE FOR FOLLOW UP. PLEASE CALL AND SCHEDULE - 1ST ATTEMPT 01/07/20   Baldwin Jamaica, PA-C  omeprazole (PRILOSEC) 20 MG capsule Take 20 mg by mouth daily.     [provider]     Objective    Physical Exam: Vitals:   08/25/20 1447 08/25/20 1500 08/25/20 1713  BP: 103/69 (!) 102/56 103/67  Pulse: 92 88 85  Resp: 17 (!) 25 (!) 22  Temp: 98 F (36.7 C)    TempSrc: Oral    SpO2: 93% 90% 96%    General: appears to be stated age; alert, oriented Skin: warm, dry, no rash Head:  AT/[place] Mouth:  Oral mucosa membranes appear moist, normal dentition Neck: supple; trachea midline Heart:  RRR; did not appreciate any M/R/G Lungs: CTAB, did not appreciate any wheezes, rales, or rhonchi Abdomen: + BS;  soft, ND, NT Vascular: 2+ pedal pulses b/l; 2+ radial pulses b/l Extremities: Trace edema in bilateral lower extremities, no muscle wasting Neuro: strength and sensation intact in upper and lower extremities b/l    Labs on Admission: I have personally reviewed following labs and imaging studies  CBC: Recent Labs  Lab 08/25/20 1510  WBC 5.4  HGB 12.8*  HCT 39.1  MCV 106.5*  PLT 606   Basic Metabolic Panel: Recent Labs  Lab 08/25/20 1510  NA 137  K 5.3*  CL 91*  CO2 26  GLUCOSE 165*  BUN 71*  CREATININE 7.09*  CALCIUM 10.1   GFR: CrCl cannot be calculated (Unknown ideal weight.). Liver Function Tests: No results for input(s): AST, ALT, ALKPHOS, BILITOT, PROT, ALBUMIN in the last 168 hours. No results for input(s): LIPASE, AMYLASE in the last 168 hours. No results for input(s): AMMONIA in the last 168 hours. Coagulation Profile: No results for input(s): INR, PROTIME in the last 168 hours. Cardiac Enzymes: No results for input(s): CKTOTAL, CKMB, CKMBINDEX, TROPONINI in the last 168 hours. BNP (last 3 results) No results for input(s): PROBNP in the last 8760  hours. HbA1C: No results for input(s): HGBA1C in the last 72 hours. CBG: No results for input(s): GLUCAP in the last 168 hours. Lipid Profile: No results for input(s): CHOL, HDL, LDLCALC, TRIG, CHOLHDL, LDLDIRECT in the last 72 hours. Thyroid Function Tests: No results for input(s): TSH, T4TOTAL, FREET4, T3FREE, THYROIDAB in the last 72 hours. Anemia Panel: No results for input(s): VITAMINB12, FOLATE, FERRITIN, TIBC, IRON, RETICCTPCT in the last 72 hours. Urine analysis:    Component Value Date/Time   COLORURINE YELLOW 11/01/2018 1130   APPEARANCEUR HAZY (A) 11/01/2018 1130   LABSPEC 1.014 11/01/2018 1130   PHURINE 9.0 (H) 11/01/2018 1130   GLUCOSEU 150 (A) 11/01/2018 1130   HGBUR SMALL (A) 11/01/2018 1130   BILIRUBINUR NEGATIVE 11/01/2018 1130   KETONESUR NEGATIVE 11/01/2018 1130   PROTEINUR 100 (A) 11/01/2018 1130   NITRITE NEGATIVE 11/01/2018 1130   LEUKOCYTESUR NEGATIVE 11/01/2018 1130    Radiological Exams on Admission: CT HEAD WO CONTRAST (5MM)  Result Date: 08/25/2020 CLINICAL DATA:  Weakness and recent fall.  Positive for COVID. EXAM: CT HEAD WITHOUT CONTRAST TECHNIQUE: Contiguous axial images were obtained from the base of the skull through the vertex without intravenous contrast. COMPARISON:  None. FINDINGS: Brain: No evidence of acute infarction, hemorrhage, hydrocephalus, extra-axial collection or mass lesion/mass effect. Diffuse cerebral atrophy. Low-attenuation changes in the deep white matter consistent with small vessel ischemic changes. Mild ventricular dilatation consistent with central atrophy. Vascular: Moderate intracranial arterial vascular calcifications. Skull: Calvarium appears intact. Sinuses/Orbits: Paranasal sinuses and mastoid air cells are clear. Other: None. IMPRESSION: No acute intracranial abnormalities. Chronic atrophy and small vessel ischemic changes. Electronically Signed   By: Lucienne Capers M.D.   On: 08/25/2020 17:31   DG Chest Portable 1  View  Result Date: 08/25/2020 CLINICAL DATA:  COVID-19 positive, fatigue. EXAM: PORTABLE CHEST 1 VIEW COMPARISON:  November 30, 2019. FINDINGS: Stable cardiomegaly. Status post coronary bypass graft. Mild central pulmonary vascular congestion is noted. No pneumothorax is noted. Mild bibasilar subsegmental atelectasis or edema is noted. Bony thorax is unremarkable. IMPRESSION: Stable cardiomegaly with mild central pulmonary vascular congestion. Mild bibasilar subsegmental atelectasis or edema is noted. Aortic Atherosclerosis (ICD10-I70.0). Electronically Signed   By: Marijo Conception M.D.   On: 08/25/2020 15:56     EKG: Independently reviewed, with result as described above.    Assessment/Plan  Eric [name] is a 87 y.o. male with medical history significant for chronic systolic/diastolic heart failure, end-stage renal disease on hemodialysis, hyperlipidemia, who is admitted to [facility] on 08/25/2020 as transfer from [place] with severe COVID-19 infection after presenting from home to the latter facility complaining of shortness of breath.    Principal Problem:   COVID-19 virus infection Active Problems:   GERD (gastroesophageal reflux disease)   ESRD on dialysis (HCC)   Chronic systolic CHF (congestive heart failure) (HCC)   HLD (hyperlipidemia)   SOB (shortness of breath)   Acute respiratory failure with hypoxia (HCC)   Generalized weakness     #) Severe COVID-19 infection: diagnosis on the basis of: 2 to 3 days of progressive shortness of breath, new onset nonproductive cough, subjective fever, and positive COVID-19 PCR performed today. Additionally, in the context of no known baseline supplemental O2 requirements, the patient is requiring 3 LN in order to maintain O2 sats greater than or equal to 94%. In setting of this acute hypoxia, criteria are met from patient's COVID-19 infection to be considered severe in nature. Consequently, there is a Grade 2c rec for dexamethasone,  which is further supported by treatment guidance recommendations from [place]'s Covid Treatment Guidelines.   Of note, in the setting of the patient's age and multiple core abilities including CHF, end-stage renal disease, this patient meets criteria to be considered high risk for a more complicated clinical course of COVID-19 infection, including increased probability for progression of the severity associated with this infection. Therefore, in the setting of symptomatic COVID-19 infection requiring hospitalization for further evaluation and management thereof in this patient with the aforementioned high risk criteria who is felt to be early in the course of their infection given onset of respiratory symptoms starting less than 7 days ago, indications are met for initiation of remdesivir per treatment guidance recommendations from [place]'s Covid Treatment Guidelines. Will evaluate ALT level with CMP.   Will closely monitor ensuing degree of hypoxia as well as associated trend in supplemental oxygen requirements. Does not appear to me indications for initiation of Tocilizumab at this time, as further described below. No known chronic underlying pulmonary pathology and denies any history of diabetes. Will follow for results of procalcitonin and trend general inflammatory markers, as further detailed below.  Presenting chest x-ray, as further detailed above.    Plan: Airborne and contact precautions. Monitor continuous pulse oximetry and monitor on telemetry. prn supplemental O2 to maintain O2 sats greater than or equal to 94%. Proning protocol initiated. PRN albuterol inhaler. PRN acetaminophen for fever. Start dexamethasone and remdesivir, as above. Check and trend inflammatory markers (fibrinogen, d dimer or fibrin derivatives, crp, ferritin, LDH). Check serum magnesium and phosphorus levels. Check CMP and CBC in the morning. Check ABG for the purpose of evaluating PaO2 to FiO2 ratio. Flutter valve  and incentive spirometry. If rapid progression of supplemental oxygen demand, development of need for high flow O2, or worsening hypoxemia with CRP > 10, would consider initiation of Tocilizumab at that point. Follow for procalcitonin, which, if non-elevated in the context of the pro inflammatory state associated with COVID-19, would provide a high degree of negative predictive value that would further decrease the likelihood of any contribution from bacterial pneumonia.     #) Acute hypoxic respiratory distress: in the context of no known baseline supplemental oxygen requirements, presenting O2 sat in the high 80s on room air, with ensuing improvement into the mid 90s on 3 L nasal  cannula, thereby meeting criteria for acute hypoxic respiratory distress as opposed to acute hypoxic respiratory failure at this time. Appears to be on the basis of COVID-19 infection, as above. No known chronic underlying pulmonary conditions. ACS is felt to be less likely at this time in the absence of any recent chest pain and in the context of  presenting EKG showing no evidence of acute ischemic process.  The patient has a documented history of chronic combined heart failure, history chest x-ray not associated with evidence of overt acutely decompensated HF. While there is increased risk for acute PE in the setting of COVID-19 infection, clinically, this appears to be less likely at this time. If rapid progression of supplemental oxygen demand or if development of need for high flow O2, would consider initiation of Tocilizumab at that point.  Procalcitonin level currently pending.   Plan: further evaluation and management of presenting COVID-19 infection, as above, including monitoring of continuous pulse oximetry with prn supplemental O2 to maintain O2 sats greater than or equal to 94%. monitor on telemetry. Trending of inflammatory markers, as above. Check CMP and CBC in the morning. Check serum Mg and Phos levels. Check  ABG for the purpose of evaluating PaO2 to FiO2 ratio. Flutter valve and incentive spirometry. PRN albuterol inhaler. dexamethasone and remdesivir.       #) Generalized weakness: Patient reports 2 to 3 days of generalized weakness,resulting in GLF, which appears associated to his presenting severe COVID-19 infection.  No evidence of associated acute focal neurologic deficits to suggest acute ischemic CVA, and CT head shows no evidence of acute intracranial process, including no evidence of intracranial hemorrhage..  Plan: Check TSH, MMA, serum magnesium level.  Physical therapy consult has been placed.  Fall precautions ordered.  Further evaluation and management of presenting severe COVID-19 infection, as above.         #) Chronic combined systolic/diastolic heart failure: Documented history of such, with most recent echocardiogram in June 2022, with results as further detailed above.  Clinically and radiographically, does not appear to be in acutely decompensated heart failure.  No overt signs at this time.  Plan: Monitor strict I's and O's and daily weights.  Add on serum magnesium level.  Repeat BMP in the morning.        #) End-stage renal disease: On hemodialysis on Tuesday, Thursday, Saturday schedule.  Next scheduled, routine hemodialysis session will be on Tuesday, 08/26/2020.  No indication for urgent overnight hemodialysis.  Plan: will need nephrology consultation in the morning to arrange for routine scheduled hemodialysis, as above.  Monitor strict I's and O's Daily weights.  Continue home Cinacalcet.  Add on serum magnesium level.  Check serum phosphorus level.       #) GERD: On omeprazole as an outpatient.  Plan: Continue home PPI.      #) Hyperlipidemia: On atorvastatin 20 mg p.o. nightly as an outpatient.  Plan: Continue statin.      DVT prophylaxis: SCDs Code Status: DNR Family Communication: none Disposition Plan: Per Rounding Team Consults  called: none;  Admission status: Inpatient; med telemetry     Of note, this patient was added by me to the following Admit List/Treatment Team: [email or web address].      PLEASE NOTE THAT DRAGON DICTATION SOFTWARE WAS USED IN THE CONSTRUCTION OF THIS NOTE.   [place] [facility] Pager [phone] From [place]  Otherwise, please contact night-coverage  [email or web address] Password [facility]   08/25/2020, 8:11 PM

## 2020-08-25 NOTE — ED Provider Notes (Signed)
[facility] EMERGENCY DEPT Provider Note   CSN: [id number] Arrival date & time: 08/25/20  1438     History Chief Complaint  Patient presents with   Fall   Weakness    Eric [name] is a 87 y.o. male.   Fall Associated symptoms include shortness of breath (Slightly worse than baseline). Pertinent negatives include no chest pain, no abdominal pain and no headaches.  Weakness Associated symptoms: cough and shortness of breath (Slightly worse than baseline)   Associated symptoms: no abdominal pain, no arthralgias, no chest pain, no diarrhea, no dizziness, no fever, no headaches, no myalgias, no nausea, no seizures and no vomiting   Patient presents for generalized weakness.  Weakness has been ongoing for several weeks.  He has spoken with his cardiologist during this time.  He was told that his heart has its baseline diminished function.  He has had a recent cough over the past few days.  He states he did have a fall last night, during which she did strike the back of his head against a piece of furniture.  He denies any LOC.  He denies any subsequent head or neck pain.  He took a home COVID test which was negative.  Patient currently on HD for ESRD.  Did a full session on Saturday.  Next session scheduled for tomorrow.  Denies any vomiting or diarrhea.  Has had his normal p.o. intake.  He states that his dry weight was recently decreased.  He believes he is at his dry weight currently.    Past Medical History:  Diagnosis Date   Anxiety    Arthritis    CHF (congestive heart failure) (HCC)    chronic systolic CHF   Complication of anesthesia    " one time my heart stopped due to a medication that I was on."    Coronary artery disease    ESRD (end stage renal disease) on dialysis ([place])    Tues, Thursday, Saturday; [facility]; [facility] (10/10/2017)   GERD (gastroesophageal reflux disease)    Gout    Heart murmur    as a teen   Herpes zoster 04/2012    Hypertension    08/28/19- now has hypotension   Left bundle branch block    Pneumonia 10/2018   Shortness of breath dyspnea    with exertion  "walking, probably due to my heart not eorking well"    Patient Active Problem List   Diagnosis Date Noted   SOB (shortness of breath) 08/26/2020   Acute respiratory failure with hypoxia ([place]) 08/26/2020   Generalized weakness 08/26/2020   COVID-19 virus infection 08/25/2020   Physical debility 10/21/2019   MCI (mild cognitive impairment) with memory loss 10/21/2019   Chronic respiratory failure with hypoxia ([place]) 06/13/2019   Alcohol use 78/29/5621   Chronic systolic CHF (congestive heart failure) ([place]) 10/31/2018   HLD (hyperlipidemia) 10/31/2018   CAD (coronary artery disease) 30/86/5784   Systolic CHF with reduced left ventricular function, NYHA class 3 ([place]) 07/21/2018   Nonischemic cardiomyopathy ([place]) 05/17/2018   Hyponatremia 10/11/2017   Hypotension 10/10/2017   Hyperkalemia 10/10/2017   S/P CABG (coronary artery bypass graft) 10/10/2017   Secondary hyperparathyroidism, renal ([place]) 05/11/2017   A-V fistula ([place]) 11/10/2016   Normocytic anemia 04/16/2016   S/P AVR 12/09/2014   Coronary artery disease involving native coronary artery of native heart with unstable angina pectoris ([place])    Insomnia, unspecified 09/24/2013   Pain in neck 09/24/2013   Gout 06/04/2013  ESRD on dialysis ([place]) 06/04/2013   Essential hypertension    GERD (gastroesophageal reflux disease)    Herpes zoster 06/04/2008    Past Surgical History:  Procedure Laterality Date   AORTIC VALVE REPLACEMENT N/A 12/09/2014   Procedure: AORTIC VALVE REPLACEMENT (AVR) WITH 23MM MAGNA EASE BIOPROSTHETIC VALVE;  Surgeon: Gaye Pollack, MD;  Location: [facility] OR;  Service: Open Heart Surgery;  Laterality: N/A;   APPENDECTOMY  1944   AV FISTULA PLACEMENT Left    Left [place] AVF placed in [place] Right 03/06/2014   Procedure: ARTERIOVENOUS (AV)  FISTULA CREATION-right radiocephalic;  Surgeon: Mal Misty, MD;  Location: [facility];  Service: Vascular;  Laterality: Right;   AV FISTULA PLACEMENT Right 07/15/2014   Procedure: ARTERIOVENOUS (AV) FISTULA CREATION;  Surgeon: Elam Dutch, MD;  Location: [facility];  Service: Vascular;  Laterality: Right;   CARDIAC CATHETERIZATION N/A 11/27/2014   Procedure: Right/Left Heart Cath and Coronary Angiography;  Surgeon: Burnell Blanks, MD;  Location: [facility];  Service: Cardiovascular;  Laterality: N/A;   CATARACT EXTRACTION W/ INTRAOCULAR LENS IMPLANT Bilateral 2014   [facility]   COLONOSCOPY  2013   Dr. Annamaria Helling [place], [place].   CORONARY ARTERY BYPASS GRAFT N/A 12/09/2014   Procedure: CORONARY ARTERY BYPASS GRAFTING (CABG), ON PUMP, TIMES THREE, USING LEFT INTERNAL MAMMARY ARTERY, RIGHT GREATER SAPHENOUS VEIN HARVESTED ENDOSCOPICALLY;  Surgeon: Gaye Pollack, MD;  Location: [facility];  Service: Open Heart Surgery;  Laterality: N/A;  LIMA-LAD; SVG-OM; SVG-PD   DIALYSIS FISTULA CREATION  08/04/2012 and 10/13   Dr. Harden Mo   FISTULA SUPERFICIALIZATION Right 08/29/2019   Procedure: RIGHT ARTERIOVENOUS FISTULA  PLICATION;  Surgeon: Serafina Mitchell, MD;  Location: [facility] OR;  Service: Vascular;  Laterality: Right;   FISTULOGRAM Right 04/12/2019   Procedure: FISTULOGRAM with Balloon Venoplasty of Right Peripheral Vein;  Surgeon: Serafina Mitchell, MD;  Location: [facility];  Service: Vascular;  Laterality: Right;   FISTULOGRAM Right 08/29/2019   Procedure: FISTULOGRAM;  Surgeon: Serafina Mitchell, MD;  Location: [facility] OR;  Service: Vascular;  Laterality: Right;   INSERTION OF DIALYSIS CATHETER Right 01-15-14   Right chest TDC placed by Dr. Augustin Coupe at [facility]   LIGATION OF ARTERIOVENOUS  FISTULA Right 07/15/2014   Procedure: LIGATION OF ARTERIOVENOUS  FISTULA  (RIGHT RADIOCEPHALIC);  Surgeon: Elam Dutch, MD;  Location: [facility];  Service: Vascular;  Laterality: Right;   LIGATION OF COMPETING  BRANCHES OF ARTERIOVENOUS FISTULA Right 05/08/2014   Procedure: LIGATION OF COMPETING BRANCHES OF RIGHT ARM RADIOCEPHALIC ARTERIOVENOUS FISTULA;  Surgeon: Mal Misty, MD;  Location: [facility];  Service: Vascular;  Laterality: Right;   RESECTION OF ARTERIOVENOUS FISTULA ANEURYSM Right 08/29/2019   Procedure: RESECTION OF ARTERIOVENOUS FISTULA ANEURYSM;  Surgeon: Serafina Mitchell, MD;  Location: [facility];  Service: Vascular;  Laterality: Right;   REVISON OF ARTERIOVENOUS FISTULA Right 07/15/2014   Procedure: EXPLORATION OF ARTERIOVENOUS FISTULA;  Surgeon: Elam Dutch, MD;  Location: [facility];  Service: Vascular;  Laterality: Right;   REVISON OF ARTERIOVENOUS FISTULA Right 04/12/2019   Procedure: REVISON OF ARTERIOVENOUS FISTULA;  Surgeon: Serafina Mitchell, MD;  Location: [facility];  Service: Vascular;  Laterality: Right;   TEE WITHOUT CARDIOVERSION N/A 12/09/2014   Procedure: TRANSESOPHAGEAL ECHOCARDIOGRAM (TEE);  Surgeon: Gaye Pollack, MD;  Location: [facility];  Service: Open Heart Surgery;  Laterality: N/A;   VENOPLASTY Right 08/29/2019   Procedure: PERIPHERAL  VENOPLASTY;  Surgeon: Serafina Mitchell, MD;  Location: [facility];  Service: Vascular;  Laterality: Right;       Family History  Problem Relation Age of Onset   Diabetes Father    Lung cancer Father 100       non-smoker   Cancer Mother        multiple myeloma   Colon cancer Neg Hx     Social History   Tobacco Use   Smoking status: Never   Smokeless tobacco: Never  Vaping Use   Vaping Use: Never used  Substance Use Topics   Alcohol use: Yes    Alcohol/week: 10.0 standard drinks    Types: 7 Standard drinks or equivalent, 3 Glasses of wine per week   Drug use: No    Home Medications Prior to Admission medications   Medication Sig Start Date End Date Taking? Authorizing Provider  acetaminophen (TYLENOL) 500 MG tablet Take 1,000 mg by mouth 3 (three) times daily as needed for moderate pain.    Yes [provider]  allopurinol  (ZYLOPRIM) 100 MG tablet Take 1 tablet (100 mg total) by mouth daily. 08/22/20  Yes Wert, Margreta Journey, NP  atorvastatin (LIPITOR) 20 MG tablet TAKE 1 TABLET BY MOUTH DAILY FOR CHOLESTEROL Patient taking differently: Take 20 mg by mouth daily. FOR CHOLESTEROL 08/22/20  Yes Wert, Christina, NP  AURYXIA 1 GM 210 MG(Fe) tablet Take 210 mg by mouth 4 (four) times daily. 08/19/20  Yes [provider]  calcium carbonate (TUMS EX) 750 MG chewable tablet Chew 1,500 mg by mouth daily with supper.   Yes [provider]  cinacalcet (SENSIPAR) 60 MG tablet Take 60 mg by mouth daily.    Yes [provider]  ethyl chloride spray Apply 1 application topically See admin instructions. Three times weekly at dialysis 08/08/20  Yes [provider]  midodrine (PROAMATINE) 10 MG tablet Take 1 tablet (10 mg total) by mouth 3 (three) times daily. OVERDUE FOR FOLLOW UP. PLEASE CALL AND SCHEDULE - 1ST ATTEMPT Patient taking differently: Take 10 mg by mouth 3 (three) times daily. 01/07/20  Yes Baldwin Jamaica, PA-C  omeprazole (PRILOSEC) 20 MG capsule Take 20 mg by mouth daily.    Yes [provider]    Allergies    Patient has no known allergies.  Review of Systems   Review of Systems  Constitutional:  Positive for fatigue. Negative for activity change, appetite change, chills and fever.  HENT:  Negative for congestion, ear pain, sore throat and trouble swallowing.   Eyes:  Negative for pain and visual disturbance.  Respiratory:  Positive for cough and shortness of breath (Slightly worse than baseline). Negative for chest tightness and wheezing.   Cardiovascular:  Negative for chest pain, palpitations and leg swelling.  Gastrointestinal:  Negative for abdominal pain, diarrhea, nausea and vomiting.  Genitourinary:        Anuric at baseline  Musculoskeletal:  Negative for arthralgias, back pain, joint swelling, myalgias and neck pain.  Skin:  Negative for color change and rash.   Neurological:  Positive for weakness (Generalized). Negative for dizziness, seizures, syncope, speech difficulty, light-headedness, numbness and headaches.  Hematological:  Does not bruise/bleed easily.  Psychiatric/Behavioral:  Negative for confusion and decreased concentration.   All other systems reviewed and are negative.  Physical Exam Updated Vital Signs BP 110/77   Pulse 92   Temp 98.3 F (36.8 C) (Oral)   Resp 20   Ht 5' 10"  (1.778 m)  Wt 74 kg   SpO2 100%   BMI 23.41 kg/m   Physical Exam Vitals and nursing note reviewed.  Constitutional:      General: He is not in acute distress.    Appearance: Normal appearance. He is well-developed. He is ill-appearing (Chronically). He is not toxic-appearing or diaphoretic.  HENT:     Head: Normocephalic and atraumatic.     Right Ear: External ear normal.     Left Ear: External ear normal.     Nose: Nose normal.     Mouth/Throat:     Mouth: Mucous membranes are moist.     Pharynx: Oropharynx is clear.  Eyes:     Conjunctiva/sclera: Conjunctivae normal.  Cardiovascular:     Rate and Rhythm: Normal rate and regular rhythm.     Heart sounds: No murmur heard. Pulmonary:     Effort: No respiratory distress.     Breath sounds: Rales present. No wheezing.     Comments: Increased work of breathing Abdominal:     Palpations: Abdomen is soft.     Tenderness: There is no abdominal tenderness. There is no guarding.  Musculoskeletal:        General: No swelling or tenderness. Normal range of motion.     Cervical back: Normal range of motion and neck supple.     Right lower leg: No edema.     Left lower leg: No edema.  Skin:    General: Skin is warm and dry.     Coloration: Skin is not jaundiced or pale.  Neurological:     General: No focal deficit present.     Mental Status: He is alert and oriented to person, place, and time.     Cranial Nerves: No cranial nerve deficit.     Sensory: No sensory deficit.     Motor: No  weakness.     Coordination: Coordination normal.  Psychiatric:        Mood and Affect: Mood normal.        Behavior: Behavior normal.    ED Results / Procedures / Treatments   Labs (all labs ordered are listed, but only abnormal results are displayed) Labs Reviewed  RESP PANEL BY RT-PCR (FLU A&B, COVID) ARPGX2 - Abnormal; Notable for the following components:      Result Value   SARS Coronavirus 2 by RT PCR POSITIVE (*)    All other components within normal limits  BASIC METABOLIC PANEL - Abnormal; Notable for the following components:   Potassium 5.3 (*)    Chloride 91 (*)    Glucose, Bld 165 (*)    BUN 71 (*)    Creatinine, Ser 7.09 (*)    GFR, Estimated 7 (*)    Anion gap 20 (*)    All other components within normal limits  CBC - Abnormal; Notable for the following components:   RBC 3.67 (*)    Hemoglobin 12.8 (*)    MCV 106.5 (*)    MCH 34.9 (*)    RDW 16.1 (*)    All other components within normal limits  C-REACTIVE PROTEIN - Abnormal; Notable for the following components:   CRP 1.7 (*)    All other components within normal limits  FERRITIN - Abnormal; Notable for the following components:   Ferritin 416 (*)    All other components within normal limits  D-DIMER, QUANTITATIVE - Abnormal; Notable for the following components:   D-Dimer, Quant 1.45 (*)    All other components within normal limits  PHOSPHORUS - Abnormal; Notable for the following components:   Phosphorus 6.1 (*)    All other components within normal limits  COMPREHENSIVE METABOLIC PANEL - Abnormal; Notable for the following components:   Sodium 134 (*)    Potassium 6.0 (*)    Chloride 93 (*)    CO2 18 (*)    Glucose, Bld 195 (*)    BUN 82 (*)    Creatinine, Ser 7.91 (*)    GFR, Estimated 6 (*)    Anion gap 23 (*)    All other components within normal limits  CBC WITH DIFFERENTIAL/PLATELET - Abnormal; Notable for the following components:   RBC 3.57 (*)    Hemoglobin 12.6 (*)    HCT 37.9 (*)     MCV 106.2 (*)    MCH 35.3 (*)    RDW 15.9 (*)    Lymphs Abs 0.3 (*)    All other components within normal limits  C-REACTIVE PROTEIN - Abnormal; Notable for the following components:   CRP 2.3 (*)    All other components within normal limits  D-DIMER, QUANTITATIVE - Abnormal; Notable for the following components:   D-Dimer, Quant 1.70 (*)    All other components within normal limits  FERRITIN - Abnormal; Notable for the following components:   Ferritin 593 (*)    All other components within normal limits  BLOOD GAS, ARTERIAL - Abnormal; Notable for the following components:   Acid-base deficit 2.3 (*)    All other components within normal limits  BRAIN NATRIURETIC PEPTIDE - Abnormal; Notable for the following components:   B Natriuretic Peptide >4,500.0 (*)    All other components within normal limits  PROCALCITONIN  PROCALCITONIN  MAGNESIUM  FIBRINOGEN  LACTATE DEHYDROGENASE  TSH  METHYLMALONIC ACID, SERUM  CBG MONITORING, ED    EKG EKG Interpretation  Date/Time:  Monday August 25 2020 14:57:55 EDT Ventricular Rate:  87 PR Interval:  175 QRS Duration: 186 QT Interval:  460 QTC Calculation: 554 R Axis:   262 Text Interpretation: Sinus rhythm Nonspecific IVCD with LAD Consider left ventricular hypertrophy Abnormal T, consider ischemia, lateral leads Similar when Compared to previous tracing Confirmed by Godfrey Pick [phone]) on 08/25/2020 3:02:52 PM  Radiology CT HEAD WO CONTRAST (5MM)  Result Date: 08/25/2020 CLINICAL DATA:  Weakness and recent fall.  Positive for COVID. EXAM: CT HEAD WITHOUT CONTRAST TECHNIQUE: Contiguous axial images were obtained from the base of the skull through the vertex without intravenous contrast. COMPARISON:  None. FINDINGS: Brain: No evidence of acute infarction, hemorrhage, hydrocephalus, extra-axial collection or mass lesion/mass effect. Diffuse cerebral atrophy. Low-attenuation changes in the deep white matter consistent with small vessel  ischemic changes. Mild ventricular dilatation consistent with central atrophy. Vascular: Moderate intracranial arterial vascular calcifications. Skull: Calvarium appears intact. Sinuses/Orbits: Paranasal sinuses and mastoid air cells are clear. Other: None. IMPRESSION: No acute intracranial abnormalities. Chronic atrophy and small vessel ischemic changes. Electronically Signed   By: Lucienne Capers M.D.   On: 08/25/2020 17:31   DG Chest Portable 1 View  Result Date: 08/25/2020 CLINICAL DATA:  COVID-19 positive, fatigue. EXAM: PORTABLE CHEST 1 VIEW COMPARISON:  November 30, 2019. FINDINGS: Stable cardiomegaly. Status post coronary bypass graft. Mild central pulmonary vascular congestion is noted. No pneumothorax is noted. Mild bibasilar subsegmental atelectasis or edema is noted. Bony thorax is unremarkable. IMPRESSION: Stable cardiomegaly with mild central pulmonary vascular congestion. Mild bibasilar subsegmental atelectasis or edema is noted. Aortic Atherosclerosis (ICD10-I70.0). Electronically Signed   By: Marijo Conception  M.D.   On: 08/25/2020 15:56    Procedures Procedures   Medications Ordered in ED Medications  acetaminophen (TYLENOL) tablet 650 mg (has no administration in time range)    Or  acetaminophen (TYLENOL) suppository 650 mg (has no administration in time range)  albuterol (VENTOLIN HFA) 108 (90 Base) MCG/ACT inhaler 1-2 puff (has no administration in time range)  dexamethasone (DECADRON) tablet 6 mg (has no administration in time range)  remdesivir 200 mg in sodium chloride 0.9% 250 mL IVPB (200 mg Intravenous New Bag/Given 08/25/20 2347)    Followed by  remdesivir 100 mg in sodium chloride 0.9 % 100 mL IVPB (100 mg Intravenous New Bag/Given 08/26/20 1025)  allopurinol (ZYLOPRIM) tablet 100 mg (has no administration in time range)  atorvastatin (LIPITOR) tablet 20 mg (20 mg Oral Given 08/26/20 1028)  calcium carbonate (TUMS - dosed in mg elemental calcium) chewable tablet 1,500 mg  (1,500 mg Oral Given 08/26/20 1000)  cinacalcet (SENSIPAR) tablet 60 mg (has no administration in time range)  midodrine (PROAMATINE) tablet 10 mg (10 mg Oral Given 08/26/20 1001)  pantoprazole (PROTONIX) EC tablet 40 mg (40 mg Oral Given 08/26/20 1028)  dexamethasone (DECADRON) injection 6 mg (6 mg Intravenous Given 08/25/20 1750)    ED Course  I have reviewed the triage vital signs and the nursing notes.  Pertinent labs & imaging results that were available during my care of the patient were reviewed by me and considered in my medical decision making (see chart for details).    MDM Rules/Calculators/A&P                           Patient is a 87 year old male with history of ESRD, on HD, and CHF.  Last echocardiogram was in June.  LVEF at that time was estimated at less than 20%.  He is current on his HD sessions with the last session taking place 2 days ago.  He believes he is at his dry weight currently.  He presents today for ongoing weakness over the past several weeks.  Weakness is described as generalized.  He is afebrile upon arrival.  Blood pressures are on the low end of normal.  EKG shows no changes from prior studies.  He has no focal neurologic deficits.  While undergoing physical exam, patient did have SPO2 desaturations into the 80s on room air.  Patient states that he has not on home oxygen during the day.  He does use it at night while sleeping.  He states that his SPO2 is typically 95% on room air.  SPO2 would range from 88 to 94%.  Breathing appears slightly labored.  Patient does note that he has had a recent cough.  Diagnostic work-up, including COVID testing was initiated.  Given his fall last night, CT scan of head was ordered.  Patient was found to be COVID-positive.  He continued to have intermittent desaturations into the 80s, at rest.  He was started on supplemental oxygen.  Given his new hypoxia, patient was admitted.  Admitting team requested additional COVID labs and  initiation of Decadron therapy.  These were ordered.  Patient was transported to [facility] for admission in stable condition.  Final Clinical Impression(s) / ED Diagnoses Final diagnoses:  [id number]    Rx / DC Orders ED Discharge Orders     None        Godfrey Pick, MD 08/26/20 1127

## 2020-08-25 NOTE — Care Plan (Signed)
87-year-old gentleman with history of systolic congestive heart failure, ESRD on HD presented to [facility] ED with complaint of shortness of breath, recent exposure to COVID.  Due for dialysis tomorrow.  Tested positive for COVID in the ED.  Requiring 3 L of oxygen, normally does not use any oxygen.  Also seems to have some vascular congestion on the chest x-ray.  Requested by ED physician to admit for further management.  Patient accepted under [facility] to be admitted at [facility] due to his requirement of hemodialysis.  Nephrology will need to be notified/consulted once patient is admitted under [facility].  I have requested ED physician to order procalcitonin, ESR, ferritin and D-dimer and provide the patient with steroids and remdesivir.  ED physician to remain responsible for all care until patient arrives at [facility] and then [facility] will assume care. 

## 2020-08-25 NOTE — ED Triage Notes (Signed)
Patient reports to the ER for weakness and recent fall. Patient reports his wife tested positive for COVID this morning. Patient states his home test was negative. Patient reports he is on Dialysis T.TH, SAT and no longer makes urine. R-arm restriction. Patient reports he had a fall last night and hit his head. Patient reports he has a DNR

## 2020-08-26 DIAGNOSIS — J9601 Acute respiratory failure with hypoxia: Secondary | ICD-10-CM | POA: Diagnosis present

## 2020-08-26 DIAGNOSIS — I5022 Chronic systolic (congestive) heart failure: Secondary | ICD-10-CM | POA: Diagnosis not present

## 2020-08-26 DIAGNOSIS — R531 Weakness: Secondary | ICD-10-CM

## 2020-08-26 DIAGNOSIS — N186 End stage renal disease: Secondary | ICD-10-CM | POA: Diagnosis not present

## 2020-08-26 DIAGNOSIS — R0602 Shortness of breath: Secondary | ICD-10-CM | POA: Diagnosis present

## 2020-08-26 DIAGNOSIS — U071 COVID-19: Secondary | ICD-10-CM | POA: Diagnosis not present

## 2020-08-26 LAB — BLOOD GAS, ARTERIAL
Acid-base deficit: 2.3 mmol/L — ABNORMAL HIGH (ref 0.0–2.0)
Bicarbonate: 21.3 mmol/L (ref 20.0–28.0)
Drawn by: 44135
FIO2: 32
O2 Saturation: 96.1 %
Patient temperature: 37
pCO2 arterial: 32.4 mmHg (ref 32.0–48.0)
pH, Arterial: 7.434 (ref 7.350–7.450)
pO2, Arterial: 88.2 mmHg (ref 83.0–108.0)

## 2020-08-26 LAB — COMPREHENSIVE METABOLIC PANEL
ALT: 28 U/L (ref 0–44)
AST: 38 U/L (ref 15–41)
Albumin: 3.7 g/dL (ref 3.5–5.0)
Alkaline Phosphatase: 106 U/L (ref 38–126)
Anion gap: 23 — ABNORMAL HIGH (ref 5–15)
BUN: 82 mg/dL — ABNORMAL HIGH (ref 8–23)
CO2: 18 mmol/L — ABNORMAL LOW (ref 22–32)
Calcium: 9.4 mg/dL (ref 8.9–10.3)
Chloride: 93 mmol/L — ABNORMAL LOW (ref 98–111)
Creatinine, Ser: 7.91 mg/dL — ABNORMAL HIGH (ref 0.61–1.24)
GFR, Estimated: 6 mL/min — ABNORMAL LOW (ref >60)
Glucose, Bld: 195 mg/dL — ABNORMAL HIGH (ref 70–99)
Potassium: 6 mmol/L — ABNORMAL HIGH (ref 3.5–5.1)
Sodium: 134 mmol/L — ABNORMAL LOW (ref 135–145)
Total Bilirubin: 0.9 mg/dL (ref 0.3–1.2)
Total Protein: 7.5 g/dL (ref 6.5–8.1)

## 2020-08-26 LAB — CBC WITH DIFFERENTIAL/PLATELET
Abs Immature Granulocytes: 0.02 10*3/uL (ref 0.00–0.07)
Basophils Absolute: 0 10*3/uL (ref 0.0–0.1)
Basophils Relative: 0 %
Eosinophils Absolute: 0 10*3/uL (ref 0.0–0.5)
Eosinophils Relative: 0 %
HCT: 37.9 % — ABNORMAL LOW (ref 39.0–52.0)
Hemoglobin: 12.6 g/dL — ABNORMAL LOW (ref 13.0–17.0)
Immature Granulocytes: 0 %
Lymphocytes Relative: 6 %
Lymphs Abs: 0.3 10*3/uL — ABNORMAL LOW (ref 0.7–4.0)
MCH: 35.3 pg — ABNORMAL HIGH (ref 26.0–34.0)
MCHC: 33.2 g/dL (ref 30.0–36.0)
MCV: 106.2 fL — ABNORMAL HIGH (ref 80.0–100.0)
Monocytes Absolute: 0.3 10*3/uL (ref 0.1–1.0)
Monocytes Relative: 5 %
Neutro Abs: 4.7 10*3/uL (ref 1.7–7.7)
Neutrophils Relative %: 89 %
Platelets: 159 10*3/uL (ref 150–400)
RBC: 3.57 MIL/uL — ABNORMAL LOW (ref 4.22–5.81)
RDW: 15.9 % — ABNORMAL HIGH (ref 11.5–15.5)
WBC: 5.4 10*3/uL (ref 4.0–10.5)
nRBC: 0 % (ref 0.0–0.2)

## 2020-08-26 LAB — FIBRINOGEN: Fibrinogen: 387 mg/dL (ref 210–475)

## 2020-08-26 LAB — PHOSPHORUS: Phosphorus: 6.1 mg/dL — ABNORMAL HIGH (ref 2.5–4.6)

## 2020-08-26 LAB — D-DIMER, QUANTITATIVE: D-Dimer, Quant: 1.7 ug/mL-FEU — ABNORMAL HIGH (ref 0.00–0.50)

## 2020-08-26 LAB — PROCALCITONIN: Procalcitonin: 0.17 ng/mL

## 2020-08-26 LAB — MAGNESIUM: Magnesium: 1.7 mg/dL (ref 1.7–2.4)

## 2020-08-26 LAB — C-REACTIVE PROTEIN: CRP: 2.3 mg/dL — ABNORMAL HIGH (ref <1.0)

## 2020-08-26 LAB — FERRITIN: Ferritin: 593 ng/mL — ABNORMAL HIGH (ref 24–336)

## 2020-08-26 LAB — LACTATE DEHYDROGENASE: LDH: 176 U/L (ref 98–192)

## 2020-08-26 MED ORDER — CALCITRIOL 0.5 MCG PO CAPS
1.2500 ug | ORAL_CAPSULE | ORAL | Status: DC
  Administered 2020-08-28 (×2): 1.25 ug via ORAL
  Filled 2020-08-26: qty 1

## 2020-08-26 MED ORDER — LOPERAMIDE HCL 2 MG PO CAPS
2.0000 mg | ORAL_CAPSULE | Freq: Four times a day (QID) | ORAL | Status: DC | PRN
  Administered 2020-08-26 – 2020-08-27 (×3): 2 mg via ORAL
  Filled 2020-08-26 (×3): qty 1

## 2020-08-26 MED ORDER — HEPARIN SODIUM (PORCINE) 5000 UNIT/ML IJ SOLN
5000.0000 [IU] | Freq: Three times a day (TID) | INTRAMUSCULAR | Status: DC
  Administered 2020-08-26 – 2020-08-29 (×10): 5000 [IU] via SUBCUTANEOUS
  Filled 2020-08-26 (×9): qty 1

## 2020-08-26 MED ORDER — CHLORHEXIDINE GLUCONATE CLOTH 2 % EX PADS
6.0000 | MEDICATED_PAD | Freq: Every day | CUTANEOUS | Status: DC
  Administered 2020-08-27 – 2020-08-29 (×3): 6 via TOPICAL

## 2020-08-26 NOTE — Evaluation (Signed)
Physical Therapy Evaluation Patient Details Name: Eric [name] MRN: [id number] DOB: 06/16/1930 Today's Date: 08/26/2020   History of Present Illness  87yo male who presented to [facility] on 08/25/20 after testing positive for Covid at [facility] ED. Also reports mechanical fall at home from covid related weakness. PMH CHF, CAD, ESRD on HD, gout, HTN, L BBB, AVR, CABG  Clinical Impression   Patient received in recliner, pleasant and cooperative today. Tried mobility without device, but very unsteady and very much benefits from BUE support. Able to gait train in room with min guard/RW without much difficulty, VSS on 3LPM O2. Left up in recliner with all needs met, nursing staff aware of patient status. Will benefit from [place] f/u at DC.     Follow Up Recommendations Home health PT;Supervision for mobility/OOB    Equipment Recommendations  Rolling walker with 5" wheels    Recommendations for Other Services       Precautions / Restrictions Precautions Precautions: Fall;Other (comment) Precaution Comments: watch sats Restrictions Weight Bearing Restrictions: No      Mobility  Bed Mobility               General bed mobility comments: up in chair upon arrival    Transfers Overall transfer level: Needs assistance Equipment used: Rolling walker (2 wheeled) Transfers: Sit to/from Stand Sit to Stand: Min guard         General transfer comment: min guard for safety, cues for hand placement  Ambulation/Gait Ambulation/Gait assistance: Min guard Gait Distance (Feet): 60 Feet (69fx2) Assistive device: Rolling walker (2 wheeled) Gait Pattern/deviations: Step-through pattern;Decreased step length - right;Decreased step length - left Gait velocity: decreased   General Gait Details: slow but steady with RW but did need cues to stay safe proximity from device  Stairs            Wheelchair Mobility    Modified Rankin (Stroke Patients Only)       Balance Overall balance  assessment: Needs assistance Sitting-balance support: Feet supported;No upper extremity supported Sitting balance-Leahy Scale: Good     Standing balance support: No upper extremity supported;During functional activity Standing balance-Leahy Scale: Poor Standing balance comment: needed ModA to maintain balance when attempting to stand without RW                             Pertinent Vitals/Pain Pain Assessment: No/denies pain    Home Living Family/patient expects to be discharged to:: Other (Comment) (independent living facility) Living Arrangements: Spouse/significant other Available Help at Discharge: Personal care attendant;Available PRN/intermittently Type of Home: Independent living facility ([facility]) Home Access: Level entry;Elevator     Home Layout: One level Home Equipment: Shower seat - built in;Electric scooter;Cane - single point;Cane - quad;Walker - 4 wheels Additional Comments: has aides that come in 6 days a week from the facility- make beds, do laundry, wash dishes, put out lunch, shop, help his wife get dressed and OOB (aides are mostly for his wife)    Prior Function Level of Independence: Independent         Comments: does not use device in the apartment, does use electric scooter when he leaves apartment; heart failure "has caught up with me over the past few years and I can't do much". Recent fall tripping on O2 line at night. No other incidents with falling/close calls otherwise. Uses O2 at night only     Hand Dominance   Dominant Hand: Right  Extremity/Trunk Assessment   Upper Extremity Assessment Upper Extremity Assessment: Defer to OT evaluation    Lower Extremity Assessment Lower Extremity Assessment: Generalized weakness    Cervical / Trunk Assessment Cervical / Trunk Assessment: Normal  Communication   Communication: No difficulties  Cognition Arousal/Alertness: Awake/alert Behavior During Therapy: WFL for tasks  assessed/performed Overall Cognitive Status: Within Functional Limits for tasks assessed                                        General Comments General comments (skin integrity, edema, etc.): VSS on 3LPM O2 just very DOE    Exercises     Assessment/Plan    PT Assessment Patient needs continued PT services  PT Problem List Decreased strength;Decreased knowledge of use of DME;Decreased activity tolerance;Decreased safety awareness;Decreased balance;Decreased mobility;Cardiopulmonary status limiting activity       PT Treatment Interventions DME instruction;Balance training;Gait training;Functional mobility training;Patient/family education;Therapeutic activities;Therapeutic exercise    PT Goals (Current goals can be found in the Care Plan section)  Acute Rehab PT Goals Patient Stated Goal: go home when medically ready PT Goal Formulation: With patient Time For Goal Achievement: 09/09/20 Potential to Achieve Goals: Good    Frequency Min 3X/week   Barriers to discharge        Co-evaluation               AM-PAC PT "6 Clicks" Mobility  Outcome Measure Help needed turning from your back to your side while in a flat bed without using bedrails?: A Little Help needed moving from lying on your back to sitting on the side of a flat bed without using bedrails?: A Little Help needed moving to and from a bed to a chair (including a wheelchair)?: A Little Help needed standing up from a chair using your arms (e.g., wheelchair or bedside chair)?: A Little Help needed to walk in hospital room?: A Little Help needed climbing 3-5 steps with a railing? : A Lot 6 Click Score: 17    End of Session Equipment Utilized During Treatment: Oxygen Activity Tolerance: Patient tolerated treatment well Patient left: in chair;with call bell/phone within reach Nurse Communication: Mobility status PT Visit Diagnosis: Muscle weakness (generalized) (M62.81);Unsteadiness on feet  (R26.81);History of falling (Z91.81)    Time: 1205-1239 PT Time Calculation (min) (ACUTE ONLY): 34 min   Charges:   PT Evaluation $PT Eval Moderate Complexity: 1 Mod PT Treatments $Gait Training: 8-22 mins       Windell Norfolk, DPT, PN2   Supplemental Physical Therapist [place]    Pager [phone] Acute Rehab Office [phone]

## 2020-08-26 NOTE — Progress Notes (Signed)
RT collect arterial blood gas at this time and sent sample to main lab.

## 2020-08-26 NOTE — Progress Notes (Addendum)
Progress Note    Eric [name]  AJO:[id number] DOB: 03/23/1930  DOA: 08/25/2020 PCP: Royal Hawthorn, NP      Brief Narrative:    Medical records reviewed and are as summarized below:  Eric [name] is a 87 y.o. male with medical history significant for chronic systolic and diastolic CHF (EF less than 20% in June 2022), ESRD on hemodialysis on T,T & S schedule), chronic hypoxic respiratory failure on nighttime home oxygen, who presented to the hospital because of increasing shortness of breath, dry cough,  subjective fever and generalized weakness. He tested positive for COVID-19 infection.  He was diagnosed with COVID-19 pneumonia.  He was treated with IV remdesivir and IV steroids.  He required oxygen via nasal cannula.  Nephrologist was consulted for hemodialysis.    Assessment/Plan:   Principal Problem:   COVID-19 virus infection Active Problems:   GERD (gastroesophageal reflux disease)   ESRD on dialysis (HCC)   Chronic systolic CHF (congestive heart failure) (HCC)   HLD (hyperlipidemia)   SOB (shortness of breath)   Acute respiratory failure with hypoxia (HCC)   Generalized weakness    Body mass index is 23.41 kg/m.   COVID-19 infection with pneumonia: Continue IV remdesivir and IV steroids.  Acute on chronic hypoxic respiratory failure: He said he uses 4 L/min oxygen at night but not during the day.  However, he is requiring daytime oxygen in the hospital.  ESRD with hyperkalemia and hyperphosphatemia: He is on T,T &S schedule. Consulted nephrologist, Dr. Jonnie Finner, for hemodialysis  Chronic systolic and diastolic CHF: Compensated.  2D echo in June 2022 showed EF less than 94%, grade 2 diastolic dysfunction, moderate to severe MR.  He has a bioprosthetic aortic valve.  Generalized weakness: Home health therapy recommended by PT.    Diet Order             Diet renal with fluid restriction Fluid restriction: 2000 mL Fluid; Room service appropriate? Yes;  Fluid consistency: Thin  Diet effective now                      Consultants: Nephrologist  Procedures: None    Medications:    allopurinol  100 mg Oral Daily   atorvastatin  20 mg Oral Daily   calcium carbonate  1,500 mg Oral TID WC   cinacalcet  60 mg Oral Q breakfast   dexamethasone  6 mg Oral Q24H   midodrine  10 mg Oral TID   pantoprazole  40 mg Oral Daily   Continuous Infusions:  remdesivir 100 mg in NS 100 mL       Anti-infectives (From admission, onward)    Start     Dose/Rate Route Frequency Ordered Stop   08/26/20 1000  remdesivir 100 mg in sodium chloride 0.9 % 100 mL IVPB       See Hyperspace for full Linked Orders Report.   100 mg 200 mL/hr over 30 Minutes Intravenous Daily 08/25/20 2031 08/30/20 0959   08/25/20 2200  remdesivir 200 mg in sodium chloride 0.9% 250 mL IVPB       See Hyperspace for full Linked Orders Report.   200 mg 580 mL/hr over 30 Minutes Intravenous Once 08/25/20 2031 08/26/20 0017              Family Communication/Anticipated D/C date and plan/Code Status   DVT prophylaxis: SCDs Start: 08/25/20 2006     Code Status: DNR  Family Communication: Plan of care was discussed with  Mr. Tahjay Binion,  Disposition Plan:    Status is: Inpatient  Remains inpatient appropriate because:IV treatments appropriate due to intensity of illness or inability to take PO  Dispo: The patient is from: Home              Anticipated d/c is to: Home              Patient currently is not medically stable to d/c.   Difficult to place patient No           Subjective:   Interval events noted.  Breathing is a little better today.  He feels weak.  Objective:    Vitals:   08/25/20 1713 08/25/20 2020 08/25/20 2233 08/26/20 1018  BP: 103/67 101/70  110/77  Pulse: 85 93  92  Resp: (!) 22 20    Temp:  98.3 F (36.8 C)    TempSrc:  Oral    SpO2: 96% 99%  100%  Weight:   74 kg   Height:   5\' 10"  (1.778 m)    No data  found.   Intake/Output Summary (Last 24 hours) at 08/26/2020 1021 Last data filed at 08/26/2020 0400 Gross per 24 hour  Intake 200 ml  Output --  Net 200 ml   Filed Weights   08/25/20 04-11-2231  Weight: 74 kg    Exam:  GEN: NAD SKIN: Warm and dry EYES: No pallor or icterus ENT: MMM CV: RRR PULM: CTA B ABD: soft, ND, NT, +BS CNS: AAO x 3, non focal EXT: No edema or tenderness        Data Reviewed:   I have personally reviewed following labs and imaging studies:  Labs: Labs show the following:   Basic Metabolic Panel: Recent Labs  Lab 08/25/20 1510 08/26/20 0203  NA 137 134*  K 5.3* 6.0*  CL 91* 93*  CO2 26 18*  GLUCOSE 165* 195*  BUN 71* 82*  CREATININE 7.09* 7.91*  CALCIUM 10.1 9.4  MG  --  1.7  PHOS  --  6.1*   GFR Estimated Creatinine Clearance: 6.7 mL/min (A) (by C-G formula based on SCr of 7.91 mg/dL (H)). Liver Function Tests: Recent Labs  Lab 08/26/20 0203  AST 38  ALT 28  ALKPHOS 106  BILITOT 0.9  PROT 7.5  ALBUMIN 3.7   No results for input(s): LIPASE, AMYLASE in the last 168 hours. No results for input(s): AMMONIA in the last 168 hours. Coagulation profile No results for input(s): INR, PROTIME in the last 168 hours.  CBC: Recent Labs  Lab 08/25/20 1510 08/26/20 0203  WBC 5.4 5.4  NEUTROABS  --  4.7  HGB 12.8* 12.6*  HCT 39.1 37.9*  MCV 106.5* 106.2*  PLT 151 159   Cardiac Enzymes: No results for input(s): CKTOTAL, CKMB, CKMBINDEX, TROPONINI in the last 168 hours. BNP (last 3 results) No results for input(s): PROBNP in the last 8760 hours. CBG: No results for input(s): GLUCAP in the last 168 hours. D-Dimer: Recent Labs    08/25/20 1745 08/26/20 0203  DDIMER 1.45* 1.70*   Hgb A1c: No results for input(s): HGBA1C in the last 72 hours. Lipid Profile: No results for input(s): CHOL, HDL, LDLCALC, TRIG, CHOLHDL, LDLDIRECT in the last 72 hours. Thyroid function studies: Recent Labs    08/25/20 04/11/55  TSH 1.117    Anemia work up: Recent Labs    08/25/20 1745 08/26/20 0203  FERRITIN 416* 593*   Sepsis Labs: Recent Labs  Lab 08/25/20 1510 08/25/20  1745 08/26/20 0203  PROCALCITON  --  0.20 0.17  WBC 5.4  --  5.4    Microbiology Recent Results (from the past 240 hour(s))  Resp Panel by RT-PCR (Flu A&B, Covid) Nasopharyngeal Swab     Status: Abnormal   Collection Time: 08/25/20  3:10 PM   Specimen: Nasopharyngeal Swab; Nasopharyngeal(NP) swabs in vial transport medium  Result Value Ref Range Status   SARS Coronavirus 2 by RT PCR POSITIVE (A) NEGATIVE Final    Comment: RESULT CALLED TO, READ BACK BY AND VERIFIED WITH: kaitlin zuelta rn 08/25/2020 1618 src  (NOTE) SARS-CoV-2 target nucleic acids are DETECTED.  The SARS-CoV-2 RNA is generally detectable in upper respiratory specimens during the acute phase of infection. Positive results are indicative of the presence of the identified virus, but do not rule out bacterial infection or co-infection with other pathogens not detected by the test. Clinical correlation with patient history and other diagnostic information is necessary to determine patient infection status. The expected result is Negative.  Fact Sheet for Patients: EntrepreneurPulse.com.au  Fact Sheet for Healthcare Providers: IncredibleEmployment.be  This test is not yet approved or cleared by the [place] FDA and  has been authorized for detection and/or diagnosis of SARS-CoV-2 by FDA under an Emergency Use Authorization (EUA).  This EUA will remain in effect (meaning this test ca n be used) for the duration of  the COVID-19 declaration under Section 564(b)(1) of the Act, 21 U.S.C. section 360bbb-3(b)(1), unless the authorization is terminated or revoked sooner.     Influenza A by PCR NEGATIVE NEGATIVE Final   Influenza B by PCR NEGATIVE NEGATIVE Final    Comment: (NOTE) The Xpert Xpress SARS-CoV-2/FLU/RSV plus assay is  intended as an aid in the diagnosis of influenza from Nasopharyngeal swab specimens and should not be used as a sole basis for treatment. Nasal washings and aspirates are unacceptable for Xpert Xpress SARS-CoV-2/FLU/RSV testing.  Fact Sheet for Patients: EntrepreneurPulse.com.au  Fact Sheet for Healthcare Providers: IncredibleEmployment.be  This test is not yet approved or cleared by the [place] FDA and has been authorized for detection and/or diagnosis of SARS-CoV-2 by FDA under an Emergency Use Authorization (EUA). This EUA will remain in effect (meaning this test can be used) for the duration of the COVID-19 declaration under Section 564(b)(1) of the Act, 21 U.S.C. section 360bbb-3(b)(1), unless the authorization is terminated or revoked.  Performed at [facility], [address] [place], [place] [address]     Procedures and diagnostic studies:  CT HEAD WO CONTRAST (5MM)  Result Date: 08/25/2020 CLINICAL DATA:  Weakness and recent fall.  Positive for COVID. EXAM: CT HEAD WITHOUT CONTRAST TECHNIQUE: Contiguous axial images were obtained from the base of the skull through the vertex without intravenous contrast. COMPARISON:  None. FINDINGS: Brain: No evidence of acute infarction, hemorrhage, hydrocephalus, extra-axial collection or mass lesion/mass effect. Diffuse cerebral atrophy. Low-attenuation changes in the deep white matter consistent with small vessel ischemic changes. Mild ventricular dilatation consistent with central atrophy. Vascular: Moderate intracranial arterial vascular calcifications. Skull: Calvarium appears intact. Sinuses/Orbits: Paranasal sinuses and mastoid air cells are clear. Other: None. IMPRESSION: No acute intracranial abnormalities. Chronic atrophy and small vessel ischemic changes. Electronically Signed   By: Lucienne Capers M.D.   On: 08/25/2020 17:31   DG Chest Portable 1 View  Result Date:  08/25/2020 CLINICAL DATA:  COVID-19 positive, fatigue. EXAM: PORTABLE CHEST 1 VIEW COMPARISON:  November 30, 2019. FINDINGS: Stable cardiomegaly. Status post coronary bypass graft. Mild central  pulmonary vascular congestion is noted. No pneumothorax is noted. Mild bibasilar subsegmental atelectasis or edema is noted. Bony thorax is unremarkable. IMPRESSION: Stable cardiomegaly with mild central pulmonary vascular congestion. Mild bibasilar subsegmental atelectasis or edema is noted. Aortic Atherosclerosis (ICD10-I70.0). Electronically Signed   By: Marijo Conception M.D.   On: 08/25/2020 15:56               LOS: 1 day   Grabiela Wohlford  [facility]   Pager on [email or web address]. If 7PM-7AM, please contact night-coverage at [email or web address]     08/26/2020, 10:21 AM

## 2020-08-26 NOTE — Consult Note (Signed)
Renal Service Consult Note [facility]  Eric [name] 08/26/2020 Sol Blazing, MD Requesting Physician: Dr. Mal Misty  Reason for Consult: ESRD pt w/ COVID infection HPI: The patient is a 87 y.o. year-old w/ h x of chronic syst CHF, ESRD on HD, gout, HTN presented to ED for weakness and a fall. He tested + for COVID yesterday morning. Is on HD TTS. DNR. +SOB w/ new cough and fevers at home. He has HFrEF w/ EF of 20%. Is not on home O2.  Om ED BP104 68, RR 22, 89% on RA improved w/ nasal O2. CXR showed vasc congestion w/o edema, no infiltrates. Pt was given IV decadron and admitted. Asked to see for ESRD.    Spoke w/ the son who states that most Tuesday mornings are very difficult for his father due to no HD for 2 days over the weekend.  Pt seen in room, states they usually pull about 2kg , sometimes 2.5 or 3 at the most w/ HD.  He states after "getting those shots" last night, he is feeling and breathing a lot better. Up in chair now, no active SOB or CP.    ROS - denies CP, no joint pain, no HA, no blurry vision, no rash, no diarrhea, no nausea/ vomiting, no dysuria, no difficulty voiding   Past Medical History  Past Medical History:  Diagnosis Date   Anxiety    Arthritis    CHF (congestive heart failure) (HCC)    chronic systolic CHF   Complication of anesthesia    " one time my heart stopped due to a medication that I was on."    Coronary artery disease    ESRD (end stage renal disease) on dialysis ([place])    Tues, Thursday, Saturday; [facility]; [facility] (10/10/2017)   GERD (gastroesophageal reflux disease)    Gout    Heart murmur    as a teen   Herpes zoster 04/2012   Hypertension    08/28/19- now has hypotension   Left bundle branch block    Pneumonia 10/2018   Shortness of breath dyspnea    with exertion  "walking, probably due to my heart not eorking well"   Past Surgical History  Past Surgical History:  Procedure Laterality Date   AORTIC VALVE  REPLACEMENT N/A 12/09/2014   Procedure: AORTIC VALVE REPLACEMENT (AVR) WITH 23MM MAGNA EASE BIOPROSTHETIC VALVE;  Surgeon: Gaye Pollack, MD;  Location: [place];  Service: Open Heart Surgery;  Laterality: N/A;   APPENDECTOMY  1944   AV FISTULA PLACEMENT Left    Left Cim[place] AVF placed in [place] Right 03/06/2014   Procedure: ARTERIOVENOUS (AV) FISTULA CREATION-right radiocephalic;  Surgeon: Mal Misty, MD;  Location: [place];  Service: Vascular;  Laterality: Right;   AV FISTULA PLACEMENT Right 07/15/2014   Procedure: ARTERIOVENOUS (AV) FISTULA CREATION;  Surgeon: Elam Dutch, MD;  Location: [place];  Service: Vascular;  Laterality: Right;   CARDIAC CATHETERIZATION N/A 11/27/2014   Procedure: Right/Left Heart Cath and Coronary Angiography;  Surgeon: Burnell Blanks, MD;  Location: [place] [facility];  Service: Cardiovascular;  Laterality: N/A;   CATARACT EXTRACTION W/ INTRAOCULAR LENS IMPLANT Bilateral 2014   [facility]   COLONOSCOPY  2013   Dr. Annamaria Helling [place], [place].   CORONARY ARTERY BYPASS GRAFT N/A 12/09/2014   Procedure: CORONARY ARTERY BYPASS GRAFTING (CABG), ON PUMP, TIMES THREE, USING LEFT INTERNAL MAMMARY ARTERY, RIGHT GREATER SAPHENOUS  VEIN HARVESTED ENDOSCOPICALLY;  Surgeon: Gaye Pollack, MD;  Location: [place];  Service: Open Heart Surgery;  Laterality: N/A;  LIMA-LAD; SVG-OM; SVG-PD   DIALYSIS FISTULA CREATION  08/04/2012 and 10/13   Dr. Harden Mo   FISTULA SUPERFICIALIZATION Right 08/29/2019   Procedure: RIGHT ARTERIOVENOUS FISTULA  PLICATION;  Surgeon: Serafina Mitchell, MD;  Location: [facility];  Service: Vascular;  Laterality: Right;   FISTULOGRAM Right 04/12/2019   Procedure: FISTULOGRAM with Balloon Venoplasty of Right Peripheral Vein;  Surgeon: Serafina Mitchell, MD;  Location: [place];  Service: Vascular;  Laterality: Right;   FISTULOGRAM Right 08/29/2019   Procedure: FISTULOGRAM;  Surgeon: Serafina Mitchell, MD;  Location: [facility] OR;   Service: Vascular;  Laterality: Right;   INSERTION OF DIALYSIS CATHETER Right 01-15-14   Right chest TDC placed by Dr. Augustin Coupe at [facility]   LIGATION OF ARTERIOVENOUS  FISTULA Right 07/15/2014   Procedure: LIGATION OF ARTERIOVENOUS  FISTULA  (RIGHT RADIOCEPHALIC);  Surgeon: Elam Dutch, MD;  Location: [place];  Service: Vascular;  Laterality: Right;   LIGATION OF COMPETING BRANCHES OF ARTERIOVENOUS FISTULA Right 05/08/2014   Procedure: LIGATION OF COMPETING BRANCHES OF RIGHT ARM RADIOCEPHALIC ARTERIOVENOUS FISTULA;  Surgeon: Mal Misty, MD;  Location: [place];  Service: Vascular;  Laterality: Right;   RESECTION OF ARTERIOVENOUS FISTULA ANEURYSM Right 08/29/2019   Procedure: RESECTION OF ARTERIOVENOUS FISTULA ANEURYSM;  Surgeon: Serafina Mitchell, MD;  Location: [place];  Service: Vascular;  Laterality: Right;   REVISON OF ARTERIOVENOUS FISTULA Right 07/15/2014   Procedure: EXPLORATION OF ARTERIOVENOUS FISTULA;  Surgeon: Elam Dutch, MD;  Location: [place];  Service: Vascular;  Laterality: Right;   REVISON OF ARTERIOVENOUS FISTULA Right 04/12/2019   Procedure: REVISON OF ARTERIOVENOUS FISTULA;  Surgeon: Serafina Mitchell, MD;  Location: [facility];  Service: Vascular;  Laterality: Right;   TEE WITHOUT CARDIOVERSION N/A 12/09/2014   Procedure: TRANSESOPHAGEAL ECHOCARDIOGRAM (TEE);  Surgeon: Gaye Pollack, MD;  Location: [place];  Service: Open Heart Surgery;  Laterality: N/A;   VENOPLASTY Right 08/29/2019   Procedure: PERIPHERAL VENOPLASTY;  Surgeon: Serafina Mitchell, MD;  Location: [facility];  Service: Vascular;  Laterality: Right;   Family History  Family History  Problem Relation Age of Onset   Diabetes Father    Lung cancer Father 77       non-smoker   Cancer Mother        multiple myeloma   Colon cancer Neg Hx    Social History  reports that he has never smoked. He has never used smokeless tobacco. He reports current alcohol use of about 10.0 standard drinks per week. He reports that he does not use  drugs. Allergies No Known Allergies Home medications Prior to Admission medications   Medication Sig Start Date End Date Taking? Authorizing Provider  acetaminophen (TYLENOL) 500 MG tablet Take 1,000 mg by mouth 3 (three) times daily as needed for moderate pain.    Yes [provider]  allopurinol (ZYLOPRIM) 100 MG tablet Take 1 tablet (100 mg total) by mouth daily. 08/22/20  Yes Wert, Margreta Journey, NP  atorvastatin (LIPITOR) 20 MG tablet TAKE 1 TABLET BY MOUTH DAILY FOR CHOLESTEROL Patient taking differently: Take 20 mg by mouth daily. FOR CHOLESTEROL 08/22/20  Yes Wert, Christina, NP  AURYXIA 1 GM 210 MG(Fe) tablet Take 210 mg by mouth 4 (four) times daily. 08/19/20  Yes [provider]  calcium carbonate (TUMS EX) 750 MG chewable tablet Chew 1,500 mg by mouth  daily with supper.   Yes [provider]  cinacalcet (SENSIPAR) 60 MG tablet Take 60 mg by mouth daily.    Yes [provider]  ethyl chloride spray Apply 1 application topically See admin instructions. Three times weekly at dialysis 08/08/20  Yes [provider]  midodrine (PROAMATINE) 10 MG tablet Take 1 tablet (10 mg total) by mouth 3 (three) times daily. OVERDUE FOR FOLLOW UP. PLEASE CALL AND SCHEDULE - 1ST ATTEMPT Patient taking differently: Take 10 mg by mouth 3 (three) times daily. 01/07/20  Yes Baldwin Jamaica, PA-C  omeprazole (PRILOSEC) 20 MG capsule Take 20 mg by mouth daily.    Yes [provider]     Vitals:   08/25/20 1713 08/25/20 2020 08/25/20 2233 08/26/20 1018  BP: 103/67 101/70  110/77  Pulse: 85 93  92  Resp: (!) 22 20    Temp:  98.3 F (36.8 C)    TempSrc:  Oral    SpO2: 96% 99%  100%  Weight:   74 kg   Height:   5' 10" (1.778 m)    Exam Gen alert, no distress, nasal O2, up in chair No rash, cyanosis or gangrene Sclera anicteric, throat clear  No jvd or bruits Chest scattered crackles at the bases, o/w clear RRR no MRG Abd soft ntnd no mass or ascites  +bs GU normal MS no joint effusions or deformity Ext no LE or UE edema, no wounds or ulcers Neuro is alert, Ox 3 , nf RUA AVF+bruit         Home meds include auryxia 1 ac tid, zyloprim, lipiotr, tums ex prn, sensipar 60 qd, midodrine 10 tid, prilosec   CXR - IMPRESSION: Stable cardiomegaly with mild central pulmonary vascular congestion. Mild bibasilar subsegmental atelectasis or edema is noted.   OP HD: TTS NW  4h  2/2 bath  69.5kg  P2  Hep none  RUA AVF   - no ESA  - calcitriol 1.25 tiw   Assessment/ Plan: COVID infection - w/ hypoxia, SOB, cough, fevers. Getting IV decadron and remdesivir. Per pmd.  Gen weakness - multifactorial HFrEF - last EF 20% Hypoxemia - d/t COVID and/or some possible volume overload. CXR w/o gross edema. HD tonight, UF 2.5- 3 L as tolerated.  ESRD - on HD TTS.  Plan HD later this evening.  Anemia ckd  - Hb 12, follow MBD ckd - Ca 9, phos 6, cont vdra and binders      Rob Collier Monica  MD 08/26/2020, 1:34 PM  Recent Labs  Lab 08/25/20 1510 08/26/20 0203  WBC 5.4 5.4  HGB 12.8* 12.6*   Recent Labs  Lab 08/25/20 1510 08/26/20 0203  K 5.3* 6.0*  BUN 71* 82*  CREATININE 7.09* 7.91*  CALCIUM 10.1 9.4  PHOS  --  6.1*

## 2020-08-26 NOTE — Progress Notes (Signed)
Eval complete, documentation pending. Able to perform at min guard level with RW. Will benefit from HHPT f/u and RW at DC.    Windell Norfolk, DPT, PN2   Supplemental Physical Therapist [place]    Pager [phone] Acute Rehab Office [phone]

## 2020-08-26 NOTE — Plan of Care (Signed)
Care Plan added 

## 2020-08-27 DIAGNOSIS — U071 COVID-19: Secondary | ICD-10-CM | POA: Diagnosis not present

## 2020-08-27 LAB — BASIC METABOLIC PANEL
Anion gap: 16 — ABNORMAL HIGH (ref 5–15)
BUN: 53 mg/dL — ABNORMAL HIGH (ref 8–23)
CO2: 23 mmol/L (ref 22–32)
Calcium: 8.9 mg/dL (ref 8.9–10.3)
Chloride: 95 mmol/L — ABNORMAL LOW (ref 98–111)
Creatinine, Ser: 4.84 mg/dL — ABNORMAL HIGH (ref 0.61–1.24)
GFR, Estimated: 11 mL/min — ABNORMAL LOW (ref >60)
Glucose, Bld: 112 mg/dL — ABNORMAL HIGH (ref 70–99)
Potassium: 3.7 mmol/L (ref 3.5–5.1)
Sodium: 134 mmol/L — ABNORMAL LOW (ref 135–145)

## 2020-08-27 LAB — HEPATITIS B SURFACE ANTIGEN: Hepatitis B Surface Ag: NONREACTIVE

## 2020-08-27 LAB — PROCALCITONIN: Procalcitonin: 0.23 ng/mL

## 2020-08-27 LAB — HEPATITIS B CORE ANTIBODY, TOTAL: Hep B Core Total Ab: NONREACTIVE

## 2020-08-27 NOTE — Progress Notes (Signed)
Physical Therapy Treatment Patient Details Name: Eric [name] MRN: [id number] DOB: 07/04/1930 Today's Date: 08/27/2020    History of Present Illness 87yo male who presented to [facility] on 08/25/20 after testing positive for Covid at [facility] ED. Also reports mechanical fall at home from covid related weakness. PMH CHF, CAD, ESRD on HD, gout, HTN, L BBB, AVR, CABG    PT Comments    Pt was able to progress to ambulating laps in the room without UE support with min guard assist, maintaining SpO2 >/= 90% on RA. However, he does display balance deficits with changing head position and with narrow static stances. He is at risk for falls and would benefit from utilizing a rollator for support to improve his safety and permit him a place to rest with longer distance mobility. Will continue to follow acutely. Current recommendations remain appropriate.  Of note: Pt reports double vision bil with cervical extension and rotation, RN made aware     Follow Up Recommendations  Home health PT;Supervision for mobility/OOB     Equipment Recommendations  Other (comment) (rollator if he does not already have one)    Recommendations for Other Services       Precautions / Restrictions Precautions Precautions: Fall;Other (comment) Precaution Comments: watch sats Restrictions Weight Bearing Restrictions: No    Mobility  Bed Mobility               General bed mobility comments: up in chair upon arrival    Transfers Overall transfer level: Needs assistance Equipment used: None Transfers: Sit to/from Stand Sit to Stand: Min guard         General transfer comment: min guard for safety, extra time to stand and mild unsteadiness but no LOB.  Ambulation/Gait Ambulation/Gait assistance: Min guard Gait Distance (Feet): 110 Feet Assistive device: None Gait Pattern/deviations: Step-through pattern;Decreased stride length;Trunk flexed Gait velocity: decreased Gait velocity interpretation:  <1.8 ft/sec, indicate of risk for recurrent falls General Gait Details: Pt ambulates at slow pace with poor bil feet clearance, maintaining a kyphotic posture with flexed hips and knees throughout. intermittently walks with hands held behind back. cues to improve posture and increase speed with min momentary success. Cued pt to look around room to ecnourage challenging gait balance with head rotations, minor stagger but no overt LOB, min guard assist for safety. Reports double vision with turning head far either direction.   Stairs             Wheelchair Mobility    Modified Rankin (Stroke Patients Only) Modified Rankin (Stroke Patients Only) Pre-Morbid Rankin Score: Moderate disability Modified Rankin: Moderate disability     Balance Overall balance assessment: Needs assistance Sitting-balance support: Feet supported;No upper extremity supported Sitting balance-Leahy Scale: Good     Standing balance support: No upper extremity supported;During functional activity Standing balance-Leahy Scale: Fair Standing balance comment: Able to ambulate without UE support, but displays unsteadiness. Unable to maintain tandem or semi-tandem stance without UE support.     Tandem Stance - Right Leg: 4 (seconds, needs UE support, LOB bouts needing minA to recover) Tandem Stance - Left Leg: 4 (seconds, needs UE support, LOB bouts needing minA to recover)       High Level Balance Comments: Semi-tandem stance held with UE support and min guard-minA ~10-15 sec each foot in lead; unable to hold tandem stance either leg in lead without LOB and minA to recover even with UE support.            Cognition Arousal/Alertness:  Awake/alert Behavior During Therapy: WFL for tasks assessed/performed Overall Cognitive Status: Within Functional Limits for tasks assessed                                        Exercises      General Comments General comments (skin integrity, edema,  etc.): SpO2 >/= 90% on RA with mobility; double vision reported bil with neck extension and rotation, RN notified      Pertinent Vitals/Pain      Home Living                      Prior Function            PT Goals (current goals can now be found in the care plan section) Acute Rehab PT Goals Patient Stated Goal: to improve his balance PT Goal Formulation: With patient Time For Goal Achievement: 09/09/20 Potential to Achieve Goals: Good Progress towards PT goals: Progressing toward goals    Frequency    Min 3X/week      PT Plan Current plan remains appropriate;Equipment recommendations need to be updated    Co-evaluation              AM-PAC PT "6 Clicks" Mobility   Outcome Measure  Help needed turning from your back to your side while in a flat bed without using bedrails?: A Little Help needed moving from lying on your back to sitting on the side of a flat bed without using bedrails?: A Little Help needed moving to and from a bed to a chair (including a wheelchair)?: A Little Help needed standing up from a chair using your arms (e.g., wheelchair or bedside chair)?: A Little Help needed to walk in hospital room?: A Little Help needed climbing 3-5 steps with a railing? : A Lot 6 Click Score: 17    End of Session   Activity Tolerance: Patient tolerated treatment well;Patient limited by fatigue Patient left: in chair;with call bell/phone within reach Nurse Communication: Mobility status;Other (comment) (double vision reported bil with neck extension and rotation) PT Visit Diagnosis: Muscle weakness (generalized) (M62.81);Unsteadiness on feet (R26.81);History of falling (Z91.81);Other abnormalities of gait and mobility (R26.89);Difficulty in walking, not elsewhere classified (R26.2)     Time: 0947-0962 PT Time Calculation (min) (ACUTE ONLY): 13 min  Charges:  $Gait Training: 8-22 mins                     Moishe Spice, PT, DPT Acute Rehabilitation  Services  Pager: [phone] Office: [place] 08/27/2020, 2:15 PM

## 2020-08-27 NOTE — Progress Notes (Addendum)
PROGRESS NOTE    Eric [name]  EGB:[id number] DOB: March 22, 1930 DOA: 08/25/2020 PCP: Royal Hawthorn, NP   Brief Narrative: 87 year old with past medical history significant for chronic systolic and diastolic heart failure (ejection fraction less than 20% in June 2022), ESRD on hemodialysis T, T and Saturday schedule, chronic hypoxic respiratory failure on night home oxygen who presented to the hospital with increasing shortness of breath, dry cough, subjective fever and generalized weakness.  Patient was found to be positive for COVID-19 infection.  Patient was admitted with COVID-19 pneumonia, he was a started on IV steroid and remdesivir. Nephrologist was consulted to assist with HD.    Assessment & Plan:   Principal Problem:   COVID-19 virus infection Active Problems:   GERD (gastroesophageal reflux disease)   ESRD on dialysis (HCC)   Chronic systolic CHF (congestive heart failure) (HCC)   HLD (hyperlipidemia)   SOB (shortness of breath)   Acute respiratory failure with hypoxia (HCC)   Generalized weakness   1-COVID-19 pneumonia: Continue with IV remdesivir and oral steroids. Day 3 IV remdesivir.  COVID-19 Labs  Recent Labs    08/25/20 1745 08/26/20 0203  DDIMER 1.45* 1.70*  FERRITIN 416* 593*  LDH  --  176  CRP 1.7* 2.3*    Lab Results  Component Value Date   SARSCOV2NAA POSITIVE (A) 08/25/2020   SARSCOV2NAA NEGATIVE 11/30/2019   [place] NEGATIVE 08/27/2019   [place] NEGATIVE 04/09/2019    2-Acute on chronic hypoxic respiratory failure: Patient uses 4 L of oxygen at night. He has been requiring oxygen during daytime in the hospital.  Currently on 3 L of oxygen. Likely  secondary to COVID-19 pneumonia.   3-ESRD with hyperkalemia and hyperphosphatemia: Nephrology following Underwent hemodialysis last night  Plan to repeat be met to follow-up potassium level  Chronic systolic and diastolic heart failure: Compensated: Volume Managed with  hemodialysis. He has a bioprosthetic aortic valve  Generalized weakness: Related to acute illness.  Home health PT at discharge./    Estimated body mass index is 21.57 kg/m as calculated from the following:   Height as of this encounter: _0  (1.778 m).   Weight as of this encounter: 68.2 kg.   DVT prophylaxis: Heparin Code Status: DNR Family Communication: Care discussed with patient, son updated over phone.  Disposition Plan:  Status is: Inpatient  Remains inpatient appropriate because:IV treatments appropriate due to intensity of illness or inability to take PO  Dispo: The patient is from: Home              Anticipated d/c is to: Home              Patient currently is not medically stable to d/c.  Discharge likely on Friday after  completion of Remdesivir.    Difficult to place patient No        Consultants:  Nephrology   Procedures:  HD  Antimicrobials:    Subjective: He is bored. He is doing puzzle in his I pad.  He is breathing better. He is asking when he can be discharge.   Objective: Vitals:   08/27/20 0430 08/27/20 0435 08/27/20 0700 08/27/20 1044  BP: 100/62 102/68 100/75   Pulse: 74 76 84   Resp:   17   Temp:  97.6 F (36.4 C) 98 F (36.7 C)   TempSrc:  Oral Oral   SpO2:   99%   Weight:  73.3 kg  68.2 kg  Height:        Intake/Output Summary (  Last 24 hours) at 08/27/2020 1242 Last data filed at 08/27/2020 0700 Gross per 24 hour  Intake 600 ml  Output 1500 ml  Net -900 ml   Filed Weights   08/27/20 0055 08/27/20 0435 08/27/20 1044  Weight: 74.8 kg 73.3 kg 68.2 kg    Examination:  General exam: Appears calm and comfortable  Respiratory system: Crackles bases.  Cardiovascular system: S1 & S2 heard, RRR.  Gastrointestinal system: Abdomen is nondistended, soft and nontender. No organomegaly or masses felt. Normal bowel sounds heard. Central nervous system: Alert and oriented. Extremities: Symmetric 5 x 5 power.   Data Reviewed:  I have personally reviewed following labs and imaging studies  CBC: Recent Labs  Lab 08/25/20 1510 08/26/20 0203  WBC 5.4 5.4  NEUTROABS  --  4.7  HGB 12.8* 12.6*  HCT 39.1 37.9*  MCV 106.5* 106.2*  PLT 151 540   Basic Metabolic Panel: Recent Labs  Lab 08/25/20 1510 08/26/20 0203  NA 137 134*  K 5.3* 6.0*  CL 91* 93*  CO2 26 18*  GLUCOSE 165* 195*  BUN 71* 82*  CREATININE 7.09* 7.91*  CALCIUM 10.1 9.4  MG  --  1.7  PHOS  --  6.1*   GFR: Estimated Creatinine Clearance: 6.2 mL/min (A) (by C-G formula based on SCr of 7.91 mg/dL (H)). Liver Function Tests: Recent Labs  Lab 08/26/20 0203  AST 38  ALT 28  ALKPHOS 106  BILITOT 0.9  PROT 7.5  ALBUMIN 3.7   No results for input(s): LIPASE, AMYLASE in the last 168 hours. No results for input(s): AMMONIA in the last 168 hours. Coagulation Profile: No results for input(s): INR, PROTIME in the last 168 hours. Cardiac Enzymes: No results for input(s): CKTOTAL, CKMB, CKMBINDEX, TROPONINI in the last 168 hours. BNP (last 3 results) No results for input(s): PROBNP in the last 8760 hours. HbA1C: No results for input(s): HGBA1C in the last 72 hours. CBG: No results for input(s): GLUCAP in the last 168 hours. Lipid Profile: No results for input(s): CHOL, HDL, LDLCALC, TRIG, CHOLHDL, LDLDIRECT in the last 72 hours. Thyroid Function Tests: Recent Labs    08/25/20 2057  TSH 1.117   Anemia Panel: Recent Labs    08/25/20 1745 08/26/20 0203  FERRITIN 416* 593*   Sepsis Labs: Recent Labs  Lab 08/25/20 1745 08/26/20 0203 08/27/20 0307  PROCALCITON 0.20 0.17 0.23    Recent Results (from the past 240 hour(s))  Resp Panel by RT-PCR (Flu A&B, Covid) Nasopharyngeal Swab     Status: Abnormal   Collection Time: 08/25/20  3:10 PM   Specimen: Nasopharyngeal Swab; Nasopharyngeal(NP) swabs in vial transport medium  Result Value Ref Range Status   SARS Coronavirus 2 by RT PCR POSITIVE (A) NEGATIVE Final    Comment: RESULT  CALLED TO, READ BACK BY AND VERIFIED WITH: kaitlin zuelta rn 08/25/2020 1618 src  (NOTE) SARS-CoV-2 target nucleic acids are DETECTED.  The SARS-CoV-2 RNA is generally detectable in upper respiratory specimens during the acute phase of infection. Positive results are indicative of the presence of the identified virus, but do not rule out bacterial infection or co-infection with other pathogens not detected by the test. Clinical correlation with patient history and other diagnostic information is necessary to determine patient infection status. The expected result is Negative.  Fact Sheet for Patients: EntrepreneurPulse.com.au  Fact Sheet for Healthcare Providers: IncredibleEmployment.be  This test is not yet approved or cleared by the [place] FDA and  has been authorized for detection and/or  diagnosis of SARS-CoV-2 by FDA under an Emergency Use Authorization (EUA).  This EUA will remain in effect (meaning this test ca n be used) for the duration of  the COVID-19 declaration under Section 564(b)(1) of the Act, 21 U.S.C. section 360bbb-3(b)(1), unless the authorization is terminated or revoked sooner.     Influenza A by PCR NEGATIVE NEGATIVE Final   Influenza B by PCR NEGATIVE NEGATIVE Final    Comment: (NOTE) The Xpert Xpress SARS-CoV-2/FLU/RSV plus assay is intended as an aid in the diagnosis of influenza from Nasopharyngeal swab specimens and should not be used as a sole basis for treatment. Nasal washings and aspirates are unacceptable for Xpert Xpress SARS-CoV-2/FLU/RSV testing.  Fact Sheet for Patients: EntrepreneurPulse.com.au  Fact Sheet for Healthcare Providers: IncredibleEmployment.be  This test is not yet approved or cleared by the [place] FDA and has been authorized for detection and/or diagnosis of SARS-CoV-2 by FDA under an Emergency Use Authorization (EUA). This EUA will  remain in effect (meaning this test can be used) for the duration of the COVID-19 declaration under Section 564(b)(1) of the Act, 21 U.S.C. section 360bbb-3(b)(1), unless the authorization is terminated or revoked.  Performed at [facility], [address] [place], [place] [address]          Radiology Studies: CT HEAD WO CONTRAST (5MM)  Result Date: 08/25/2020 CLINICAL DATA:  Weakness and recent fall.  Positive for COVID. EXAM: CT HEAD WITHOUT CONTRAST TECHNIQUE: Contiguous axial images were obtained from the base of the skull through the vertex without intravenous contrast. COMPARISON:  None. FINDINGS: Brain: No evidence of acute infarction, hemorrhage, hydrocephalus, extra-axial collection or mass lesion/mass effect. Diffuse cerebral atrophy. Low-attenuation changes in the deep white matter consistent with small vessel ischemic changes. Mild ventricular dilatation consistent with central atrophy. Vascular: Moderate intracranial arterial vascular calcifications. Skull: Calvarium appears intact. Sinuses/Orbits: Paranasal sinuses and mastoid air cells are clear. Other: None. IMPRESSION: No acute intracranial abnormalities. Chronic atrophy and small vessel ischemic changes. Electronically Signed   By: Lucienne Capers M.D.   On: 08/25/2020 17:31   DG Chest Portable 1 View  Result Date: 08/25/2020 CLINICAL DATA:  COVID-19 positive, fatigue. EXAM: PORTABLE CHEST 1 VIEW COMPARISON:  November 30, 2019. FINDINGS: Stable cardiomegaly. Status post coronary bypass graft. Mild central pulmonary vascular congestion is noted. No pneumothorax is noted. Mild bibasilar subsegmental atelectasis or edema is noted. Bony thorax is unremarkable. IMPRESSION: Stable cardiomegaly with mild central pulmonary vascular congestion. Mild bibasilar subsegmental atelectasis or edema is noted. Aortic Atherosclerosis (ICD10-I70.0). Electronically Signed   By: Marijo Conception M.D.   On: 08/25/2020 15:56         Scheduled Meds:  allopurinol  100 mg Oral Daily   atorvastatin  20 mg Oral Daily   [START ON 08/28/2020] calcitRIOL  1.25 mcg Oral Q T,Th,Sa-HD   calcium carbonate  1,500 mg Oral TID WC   Chlorhexidine Gluconate Cloth  6 each Topical Q0600   cinacalcet  60 mg Oral Q breakfast   dexamethasone  6 mg Oral Q24H   heparin injection (subcutaneous)  5,000 Units Subcutaneous Q8H   midodrine  10 mg Oral TID   pantoprazole  40 mg Oral Daily   Continuous Infusions:  remdesivir 100 mg in NS 100 mL 100 mg (08/27/20 1041)     LOS: 2 days    Time spent: 35 minutes.     Elmarie Shiley, MD [facility]   If 7PM-7AM, please contact night-coverage [email or web address]  08/27/2020, 12:42 PM

## 2020-08-27 NOTE — Plan of Care (Signed)

## 2020-08-27 NOTE — Progress Notes (Signed)
[facility] Progress Note  Subjective: seen in chair, no HD issues, 1.5 L UF last night w/ HD  Vitals:   08/27/20 0415 08/27/20 0430 08/27/20 0435 08/27/20 0700  BP: (!) 104/57 100/62 102/68 100/75  Pulse: 77 74 76 84  Resp:    17  Temp:   97.6 F (36.4 C) 98 F (36.7 C)  TempSrc:   Oral Oral  SpO2:    99%  Weight:   73.3 kg   Height:        Exam:  alert, nad , eldelry , frail, up in chair  no jvd  Chest cta bilat  Cor reg no RG  Abd soft ntnd no ascites   Ext no LE edema   Alert, NF, ox3   RUA AVF+bruit     Home meds include auryxia 1 ac tid, zyloprim, lipiotr, tums ex prn, sensipar 60 qd, midodrine 10 tid, prilosec    CXR - IMPRESSION: Stable cardiomegaly with mild central pulmonary vascular congestion. Mild bibasilar subsegmental atelectasis or edema is noted.    OP HD: TTS [facility]  4h  2/2 bath  69.5kg  P2  Hep none  RUA AVF   - no ESA  - calcitriol 1.25 tiw     Assessment/ Plan: COVID pna - w/ hypoxia, SOB, cough, fevers. Getting IV decadron and remdesivir. Feeling better. Per pmd.  Gen weakness - multifactorial HFrEF - last EF 20% Hypoxemia - d/t COVID and some possible volume overload. CXR w/o gross edema. Had HD 8/23 w/ 1.5 L off. Today standing wt is 68.5kg, 1kg under dry wt. BP's soft, no gross vol excess on exam. Small UF next HD.  ESRD - on HD TTS.  Had HD last night here. Next HD tomorrow on schedule if still here.  Anemia ckd  - Hb 12, follow MBD ckd - Ca 9, phos 6, cont vdra and binders       Rob Uyen Eichholz 08/27/2020, 10:44 AM   Recent Labs  Lab 08/25/20 1510 08/26/20 0203  K 5.3* 6.0*  BUN 71* 82*  CREATININE 7.09* 7.91*  CALCIUM 10.1 9.4  PHOS  --  6.1*  HGB 12.8* 12.6*   Inpatient medications:  allopurinol  100 mg Oral Daily   atorvastatin  20 mg Oral Daily   [START ON 08/28/2020] calcitRIOL  1.25 mcg Oral Q T,Th,Sa-HD   calcium carbonate  1,500 mg Oral TID WC   Chlorhexidine Gluconate Cloth  6 each Topical Q0600    cinacalcet  60 mg Oral Q breakfast   dexamethasone  6 mg Oral Q24H   heparin injection (subcutaneous)  5,000 Units Subcutaneous Q8H   midodrine  10 mg Oral TID   pantoprazole  40 mg Oral Daily    remdesivir 100 mg in NS 100 mL 100 mg (08/27/20 1041)   acetaminophen **OR** acetaminophen, albuterol, loperamide

## 2020-08-28 DIAGNOSIS — U071 COVID-19: Secondary | ICD-10-CM | POA: Diagnosis not present

## 2020-08-28 LAB — RENAL FUNCTION PANEL
Albumin: 3.5 g/dL (ref 3.5–5.0)
Anion gap: 20 — ABNORMAL HIGH (ref 5–15)
BUN: 77 mg/dL — ABNORMAL HIGH (ref 8–23)
CO2: 21 mmol/L — ABNORMAL LOW (ref 22–32)
Calcium: 8.8 mg/dL — ABNORMAL LOW (ref 8.9–10.3)
Chloride: 92 mmol/L — ABNORMAL LOW (ref 98–111)
Creatinine, Ser: 6.89 mg/dL — ABNORMAL HIGH (ref 0.61–1.24)
GFR, Estimated: 7 mL/min — ABNORMAL LOW (ref >60)
Glucose, Bld: 144 mg/dL — ABNORMAL HIGH (ref 70–99)
Phosphorus: 6.7 mg/dL — ABNORMAL HIGH (ref 2.5–4.6)
Potassium: 5.8 mmol/L — ABNORMAL HIGH (ref 3.5–5.1)
Sodium: 133 mmol/L — ABNORMAL LOW (ref 135–145)

## 2020-08-28 LAB — C-REACTIVE PROTEIN: CRP: 1.7 mg/dL — ABNORMAL HIGH (ref <1.0)

## 2020-08-28 LAB — HEPATITIS B SURFACE ANTIBODY, QUANTITATIVE: Hep B S AB Quant (Post): >1000 m[IU]/mL (ref >9.9)

## 2020-08-28 MED ORDER — CALCITRIOL 0.25 MCG PO CAPS
ORAL_CAPSULE | ORAL | Status: AC
  Filled 2020-08-28: qty 1

## 2020-08-28 MED ORDER — CALCITRIOL 0.5 MCG PO CAPS
ORAL_CAPSULE | ORAL | Status: AC
  Filled 2020-08-28: qty 2

## 2020-08-28 NOTE — Progress Notes (Signed)
[facility] Progress Note  Subjective: seen on HD, weighed in at 68.4kg this am in the bed, similar wt's yest on standing  Vitals:   08/28/20 1030 08/28/20 1100 08/28/20 1130 08/28/20 1200  BP: 106/71 106/62 104/65 107/65  Pulse: 80 60 79 78  Resp: 18 16 18 18   Temp:      TempSrc:      SpO2:      Weight:      Height:        Exam:  alert, nad , eldelry , masked  no jvd  Chest cta bilat  Cor reg no RG  Abd soft ntnd no ascites   Ext no LE edema   Alert, NF, ox3   RUA AVF+bruit     Home meds include auryxia 1 ac tid, zyloprim, lipiotr, tums ex prn, sensipar 60 qd, midodrine 10 tid, prilosec    CXR - IMPRESSION: Stable cardiomegaly with mild central pulmonary vascular congestion. Mild bibasilar subsegmental atelectasis or edema is noted.    OP HD: TTS NW  4h  2/2 bath  69.5kg  P2  Hep none  RUA AVF   - no ESA  - calcitriol 1.25 tiw     Assessment/ Plan: COVID pna - w/ hypoxia, SOB, cough, fevers. Getting IV decadron and remdesivir. Feeling better. Likely will be dc'd home tomorrow, per pmd.  Gen weakness - multifactorial HFrEF - last EF 20% Hypoxemia - d/t COVID and some possible volume overload. CXR w/o gross edema. BP's soft, no gross vol excess on exam. 1kg under today, tolerating 1.5 L goal. Will likely lower dry wt 1-2kg upon dc.   ESRD - on HD TTS. Had HD here 8/23 and is on HD now on schedule. He will go to his usual HD unit at the usual time- they are not quarantining COVID +pt's at OP HD units now but they are ''separating" them from the others.  Anemia ckd  - Hb 12, follow MBD ckd - Ca 9, phos 6, cont vdra and binders       Rob Nathaniel Yaden 08/28/2020, 12:35 PM   Recent Labs  Lab 08/25/20 1510 08/26/20 0203 08/27/20 0318 08/28/20 0348  K 5.3* 6.0* 3.7 5.8*  BUN 71* 82* 53* 77*  CREATININE 7.09* 7.91* 4.84* 6.89*  CALCIUM 10.1 9.4 8.9 8.8*  PHOS  --  6.1*  --  6.7*  HGB 12.8* 12.6*  --   --     Inpatient medications:  allopurinol   100 mg Oral Daily   atorvastatin  20 mg Oral Daily   calcitRIOL       calcitRIOL       calcitRIOL  1.25 mcg Oral Q T,Th,Sa-HD   calcium carbonate  1,500 mg Oral TID WC   Chlorhexidine Gluconate Cloth  6 each Topical Q0600   cinacalcet  60 mg Oral Q breakfast   dexamethasone  6 mg Oral Q24H   heparin injection (subcutaneous)  5,000 Units Subcutaneous Q8H   midodrine  10 mg Oral TID   pantoprazole  40 mg Oral Daily    remdesivir 100 mg in NS 100 mL 100 mg (08/27/20 1041)   acetaminophen **OR** acetaminophen, albuterol, loperamide

## 2020-08-28 NOTE — Progress Notes (Signed)
PROGRESS NOTE    Eric [name]  JME:[id number] DOB: 02/03/1930 DOA: 08/25/2020 PCP: Royal Hawthorn, NP   Brief Narrative: 87 year old with past medical history significant for chronic systolic and diastolic heart failure (ejection fraction less than 20% in June 2022), ESRD on hemodialysis T, T and Saturday schedule, chronic hypoxic respiratory failure on night home oxygen who presented to the hospital with increasing shortness of breath, dry cough, subjective fever and generalized weakness.  Patient was found to be positive for COVID-19 infection.  Patient was admitted with COVID-19 pneumonia, he was a started on IV steroid and remdesivir. Nephrologist was consulted to assist with HD.    Assessment & Plan:   Principal Problem:   COVID-19 virus infection Active Problems:   GERD (gastroesophageal reflux disease)   ESRD on dialysis (HCC)   Chronic systolic CHF (congestive heart failure) (HCC)   HLD (hyperlipidemia)   SOB (shortness of breath)   Acute respiratory failure with hypoxia (HCC)   Generalized weakness   1-COVID-19 pneumonia: Continue with IV remdesivir and oral steroids. Day 4 IV remdesivir. Stable, plan to discharge home tomorrow.   COVID-19 Labs  Recent Labs    08/25/20 1745 08/26/20 0203 08/28/20 0348  DDIMER 1.45* 1.70*  --   FERRITIN 416* 593*  --   LDH  --  176  --   CRP 1.7* 2.3* 1.7*     Lab Results  Component Value Date   SARSCOV2NAA POSITIVE (A) 08/25/2020   SARSCOV2NAA NEGATIVE 11/30/2019   [place] NEGATIVE 08/27/2019   [place] NEGATIVE 04/09/2019     2-Acute on chronic hypoxic respiratory failure: Patient uses 4 L of oxygen at night. He has been requiring oxygen during daytime in the hospital.  Currently on 3 L of oxygen. Likely  secondary to COVID-19 pneumonia. Currently n 2 l oxygen.   3-ESRD with hyperkalemia and hyperphosphatemia: Nephrology following Getting HD today  Chronic systolic and diastolic heart failure:  Compensated: Volume Managed with hemodialysis. He has a bioprosthetic aortic valve  Generalized weakness: Related to acute illness.  Home health PT at discharge./    Estimated body mass index is 21.04 kg/m as calculated from the following:   Height as of this encounter: 5\' 10"  (1.778 m).   Weight as of this encounter: 66.5 kg.   DVT prophylaxis: Heparin Code Status: DNR Family Communication: Care discussed with patient, son updated over phone 8/24.  Disposition Plan:  Status is: Inpatient  Remains inpatient appropriate because:IV treatments appropriate due to intensity of illness or inability to take PO  Dispo: The patient is from: Home              Anticipated d/c is to: Home              Patient currently is not medically stable to d/c.  Discharge likely on Friday after  completion of Remdesivir.    Difficult to place patient No        Consultants:  Nephrology   Procedures:  HD  Antimicrobials:    Subjective: He is feeling better, breathing better. No further diarrhea.   Objective: Vitals:   08/28/20 1100 08/28/20 1130 08/28/20 1200 08/28/20 1234  BP: 106/62 104/65 107/65 102/61  Pulse: 60 79 78 78  Resp: 16 18 18 18   Temp:    98 F (36.7 C)  TempSrc:    Axillary  SpO2:    98%  Weight:    66.5 kg  Height:        Intake/Output Summary (Last 24 hours)  at 08/28/2020 1604 Last data filed at 08/28/2020 1234 Gross per 24 hour  Intake --  Output 2000 ml  Net -2000 ml    Filed Weights   08/28/20 0800 08/28/20 0900 08/28/20 1234  Weight: 74.3 kg 68.4 kg 66.5 kg    Examination:  General exam: NAD Respiratory system: Crackles bases Cardiovascular system: S 1, S 2  RRR Gastrointestinal system: BS present, soft, nt Central nervous system: alert and oriented Extremities: no edema   Data Reviewed: I have personally reviewed following labs and imaging studies  CBC: Recent Labs  Lab 08/25/20 1510 08/26/20 0203  WBC 5.4 5.4  NEUTROABS  --  4.7   HGB 12.8* 12.6*  HCT 39.1 37.9*  MCV 106.5* 106.2*  PLT 151 474    Basic Metabolic Panel: Recent Labs  Lab 08/25/20 1510 08/26/20 0203 08/27/20 0318 08/28/20 0348  NA 137 134* 134* 133*  K 5.3* 6.0* 3.7 5.8*  CL 91* 93* 95* 92*  CO2 26 18* 23 21*  GLUCOSE 165* 195* 112* 144*  BUN 71* 82* 53* 77*  CREATININE 7.09* 7.91* 4.84* 6.89*  CALCIUM 10.1 9.4 8.9 8.8*  MG  --  1.7  --   --   PHOS  --  6.1*  --  6.7*    GFR: Estimated Creatinine Clearance: 7 mL/min (A) (by C-G formula based on SCr of 6.89 mg/dL (H)). Liver Function Tests: Recent Labs  Lab 08/26/20 0203 08/28/20 0348  AST 38  --   ALT 28  --   ALKPHOS 106  --   BILITOT 0.9  --   PROT 7.5  --   ALBUMIN 3.7 3.5    No results for input(s): LIPASE, AMYLASE in the last 168 hours. No results for input(s): AMMONIA in the last 168 hours. Coagulation Profile: No results for input(s): INR, PROTIME in the last 168 hours. Cardiac Enzymes: No results for input(s): CKTOTAL, CKMB, CKMBINDEX, TROPONINI in the last 168 hours. BNP (last 3 results) No results for input(s): PROBNP in the last 8760 hours. HbA1C: No results for input(s): HGBA1C in the last 72 hours. CBG: No results for input(s): GLUCAP in the last 168 hours. Lipid Profile: No results for input(s): CHOL, HDL, LDLCALC, TRIG, CHOLHDL, LDLDIRECT in the last 72 hours. Thyroid Function Tests: Recent Labs    08/25/20 2057  TSH 1.117    Anemia Panel: Recent Labs    08/25/20 1745 08/26/20 0203  FERRITIN 416* 593*    Sepsis Labs: Recent Labs  Lab 08/25/20 1745 08/26/20 0203 08/27/20 0307  PROCALCITON 0.20 0.17 0.23     Recent Results (from the past 240 hour(s))  Resp Panel by RT-PCR (Flu A&B, Covid) Nasopharyngeal Swab     Status: Abnormal   Collection Time: 08/25/20  3:10 PM   Specimen: Nasopharyngeal Swab; Nasopharyngeal(NP) swabs in vial transport medium  Result Value Ref Range Status   SARS Coronavirus 2 by RT PCR POSITIVE (A) NEGATIVE  Final    Comment: RESULT CALLED TO, READ BACK BY AND VERIFIED WITH: kaitlin zuelta rn 08/25/2020 1618 src  (NOTE) SARS-CoV-2 target nucleic acids are DETECTED.  The SARS-CoV-2 RNA is generally detectable in upper respiratory specimens during the acute phase of infection. Positive results are indicative of the presence of the identified virus, but do not rule out bacterial infection or co-infection with other pathogens not detected by the test. Clinical correlation with patient history and other diagnostic information is necessary to determine patient infection status. The expected result is Negative.  Fact Sheet  for Patients: EntrepreneurPulse.com.au  Fact Sheet for Healthcare Providers: IncredibleEmployment.be  This test is not yet approved or cleared by the [place] FDA and  has been authorized for detection and/or diagnosis of SARS-CoV-2 by FDA under an Emergency Use Authorization (EUA).  This EUA will remain in effect (meaning this test ca n be used) for the duration of  the COVID-19 declaration under Section 564(b)(1) of the Act, 21 U.S.C. section 360bbb-3(b)(1), unless the authorization is terminated or revoked sooner.     Influenza A by PCR NEGATIVE NEGATIVE Final   Influenza B by PCR NEGATIVE NEGATIVE Final    Comment: (NOTE) The Xpert Xpress SARS-CoV-2/FLU/RSV plus assay is intended as an aid in the diagnosis of influenza from Nasopharyngeal swab specimens and should not be used as a sole basis for treatment. Nasal washings and aspirates are unacceptable for Xpert Xpress SARS-CoV-2/FLU/RSV testing.  Fact Sheet for Patients: EntrepreneurPulse.com.au  Fact Sheet for Healthcare Providers: IncredibleEmployment.be  This test is not yet approved or cleared by the [place] FDA and has been authorized for detection and/or diagnosis of SARS-CoV-2 by FDA under an Emergency Use Authorization  (EUA). This EUA will remain in effect (meaning this test can be used) for the duration of the COVID-19 declaration under Section 564(b)(1) of the Act, 21 U.S.C. section 360bbb-3(b)(1), unless the authorization is terminated or revoked.  Performed at [facility], [address], [place], [place] [address]           Radiology Studies: No results found.      Scheduled Meds:  allopurinol  100 mg Oral Daily   atorvastatin  20 mg Oral Daily   calcitRIOL       calcitRIOL       calcitRIOL  1.25 mcg Oral Q T,Th,Sa-HD   calcium carbonate  1,500 mg Oral TID WC   Chlorhexidine Gluconate Cloth  6 each Topical Q0600   cinacalcet  60 mg Oral Q breakfast   dexamethasone  6 mg Oral Q24H   heparin injection (subcutaneous)  5,000 Units Subcutaneous Q8H   midodrine  10 mg Oral TID   pantoprazole  40 mg Oral Daily   Continuous Infusions:  remdesivir 100 mg in NS 100 mL 100 mg (08/28/20 1306)     LOS: 3 days    Time spent: 35 minutes.     Elmarie Shiley, MD [facility]   If 7PM-7AM, please contact night-coverage [email or web address]  08/28/2020, 4:04 PM

## 2020-08-29 DIAGNOSIS — U071 COVID-19: Secondary | ICD-10-CM | POA: Diagnosis not present

## 2020-08-29 LAB — BASIC METABOLIC PANEL
Anion gap: 18 — ABNORMAL HIGH (ref 5–15)
BUN: 68 mg/dL — ABNORMAL HIGH (ref 8–23)
CO2: 23 mmol/L (ref 22–32)
Calcium: 9.1 mg/dL (ref 8.9–10.3)
Chloride: 93 mmol/L — ABNORMAL LOW (ref 98–111)
Creatinine, Ser: 5.72 mg/dL — ABNORMAL HIGH (ref 0.61–1.24)
GFR, Estimated: 9 mL/min — ABNORMAL LOW (ref >60)
Glucose, Bld: 325 mg/dL — ABNORMAL HIGH (ref 70–99)
Potassium: 4.4 mmol/L (ref 3.5–5.1)
Sodium: 134 mmol/L — ABNORMAL LOW (ref 135–145)

## 2020-08-29 LAB — GLUCOSE, CAPILLARY: Glucose-Capillary: 208 mg/dL — ABNORMAL HIGH (ref 70–99)

## 2020-08-29 MED ORDER — INSULIN ASPART 100 UNIT/ML IJ SOLN: 0.0000 [IU] | Freq: Three times a day (TID) | INTRAMUSCULAR | Status: DC

## 2020-08-29 MED ORDER — DEXAMETHASONE 6 MG PO TABS: 6.0000 mg | ORAL_TABLET | ORAL | 0 refills | Status: DC

## 2020-08-29 NOTE — Progress Notes (Signed)
Physical Therapy Treatment Patient Details Name: Eric [name] MRN: [id number] DOB: [birth year] Today's Date: 08/29/2020    History of Present Illness 87yo male who presented to [facility] on 08/25/20 after testing positive for Covid at [facility] ED. Also reports mechanical fall at home from covid related weakness. PMH CHF, CAD, ESRD on HD, gout, HTN, L BBB, AVR, CABG    PT Comments    Patient received in recliner, very pleasant and cooperative today. Trialed rollator- did need intermitted VC for locking/unlocking device with transfers, but did well and able to use device safely with gait. SpO2 96% on RA during session. Declines going home with rollator- "we don't have space in our apt for another device"-, education provided on use of personal walker until HHPT is able to progress him back to cane, also encouraged f/u with MD regarding covid care after DC. Left up in recliner with all needs met, eager for possible DC later today. Will continue to follow while he remains in house.     Follow Up Recommendations  Home health PT;Supervision for mobility/OOB     Equipment Recommendations  Other (comment) (would benefit from rollator but refusing)    Recommendations for Other Services       Precautions / Restrictions Precautions Precautions: Fall;Other (comment) Precaution Comments: watch sats Restrictions Weight Bearing Restrictions: No    Mobility  Bed Mobility               General bed mobility comments: up in chair upon arrival    Transfers Overall transfer level: Needs assistance Equipment used: 4-wheeled walker Transfers: Sit to/from Stand Sit to Stand: Supervision         General transfer comment: general distant S for safety, VC for locking/unlocking rollator  Ambulation/Gait Ambulation/Gait assistance: Supervision Gait Distance (Feet): 90 Feet Assistive device: 4-wheeled walker Gait Pattern/deviations: Step-through pattern;Decreased stride length;Trunk  flexed Gait velocity: decreased   General Gait Details: slow but steady with rollator, participated in 3 laps in room (limited due to covid precautions), then declined walking further stating "I'm bored". Able to use device safely.   Stairs             Wheelchair Mobility    Modified Rankin (Stroke Patients Only)       Balance Overall balance assessment: Needs assistance Sitting-balance support: Feet supported;No upper extremity supported Sitting balance-Leahy Scale: Normal     Standing balance support: Bilateral upper extremity supported;During functional activity Standing balance-Leahy Scale: Fair Standing balance comment: benefits from BUE support dynamically                            Cognition Arousal/Alertness: Awake/alert Behavior During Therapy: WFL for tasks assessed/performed Overall Cognitive Status: Within Functional Limits for tasks assessed                                        Exercises      General Comments General comments (skin integrity, edema, etc.): SpO2 96% on room air no SOB or DOE noted      Pertinent Vitals/Pain Pain Assessment: Faces Faces Pain Scale: No hurt Pain Intervention(s): Limited activity within patient's tolerance;Monitored during session    Home Living                      Prior Function  PT Goals (current goals can now be found in the care plan section) Acute Rehab PT Goals Patient Stated Goal: to improve his balance PT Goal Formulation: With patient Time For Goal Achievement: 09/09/20 Potential to Achieve Goals: Good Progress towards PT goals: Progressing toward goals    Frequency    Min 3X/week      PT Plan Current plan remains appropriate    Co-evaluation              AM-PAC PT "6 Clicks" Mobility   Outcome Measure  Help needed turning from your back to your side while in a flat bed without using bedrails?: A Little Help needed moving from  lying on your back to sitting on the side of a flat bed without using bedrails?: A Little Help needed moving to and from a bed to a chair (including a wheelchair)?: A Little Help needed standing up from a chair using your arms (e.g., wheelchair or bedside chair)?: A Little Help needed to walk in hospital room?: A Little Help needed climbing 3-5 steps with a railing? : A Lot 6 Click Score: 17    End of Session   Activity Tolerance: Patient tolerated treatment well Patient left: in chair;with call bell/phone within reach Nurse Communication: Mobility status PT Visit Diagnosis: Muscle weakness (generalized) (M62.81);Unsteadiness on feet (R26.81);History of falling (Z91.81);Other abnormalities of gait and mobility (R26.89);Difficulty in walking, not elsewhere classified (R26.2)     Time: 9217-8375 PT Time Calculation (min) (ACUTE ONLY): 28 min  Charges:  $Gait Training: 8-22 mins $Self Care/Home Management: 8-22                    Windell Norfolk, DPT, PN2   Supplemental Physical Therapist [place]    Pager [phone] Acute Rehab Office [phone]

## 2020-08-29 NOTE — Progress Notes (Signed)
Discharge orders received. Printed AVS forms and reviewed with patient; verbalized understanding. PIVx1 removed. Patient changed into own clothes. Notified family of d.c and awaiting ride at this time. Patient has all personal belongings at bedside.

## 2020-08-29 NOTE — Discharge Summary (Signed)
Physician Discharge Summary  Eric [name] ZCH:[id number] DOB: 03/06/1930 DOA: 08/25/2020  PCP: Royal Hawthorn, NP  Admit date: 08/25/2020 Discharge date: 08/29/2020  Admitted From: Home  Disposition:  Home   Recommendations for Outpatient Follow-up:  Follow up with PCP in 1-2 weeks Please obtain BMP/CBC in one week Follow up with HD.   Home Health: Yes,  Discharge Condition: Stable.  CODE STATUS: Full code Diet recommendation: Heart Healthy   Brief/Interim Summary: 87 year old with past medical history significant for chronic systolic and diastolic heart failure (ejection fraction less than 20% in June 2022), ESRD on hemodialysis T, T and Saturday schedule, chronic hypoxic respiratory failure on night home oxygen who presented to the hospital with increasing shortness of breath, dry cough, subjective fever and generalized weakness.  Patient was found to be positive for COVID-19 infection.   Patient was admitted with COVID-19 pneumonia, he was a started on IV steroid and remdesivir. Nephrologist was consulted to assist with HD.      1-COVID-19 pneumonia: Completed 5 days of IV remdesivir and dexamethasone.  Plan to discharge home today, stable.  Needs to Quarantine for total 14 days from positive test    COVID-19 Labs   Recent Labs (last 2 labs)        Recent Labs    08/25/20 1745 08/26/20 0203 08/28/20 0348  DDIMER 1.45* 1.70*  --   FERRITIN 416* 593*  --   LDH  --  176  --   CRP 1.7* 2.3* 1.7*         Recent Labs       Lab Results  Component Value Date    SARSCOV2NAA POSITIVE (A) 08/25/2020    SARSCOV2NAA NEGATIVE 11/30/2019    [place] NEGATIVE 08/27/2019    [place] NEGATIVE 04/09/2019       2-Acute on chronic hypoxic respiratory failure: Patient uses 4 L of oxygen at night. He has been requiring oxygen during daytime in the hospital.  Currently on 3 L of oxygen. Likely  secondary to COVID-19 pneumonia. On Room air currently. Stable for discharge.  Resume oxygen at HS   3-ESRD with hyperkalemia and hyperphosphatemia: Nephrology following Resume out patient HD   Chronic systolic and diastolic heart failure: Compensated: Volume Managed with hemodialysis. He has a bioprosthetic aortic valve   Generalized weakness: Related to acute illness.  Home health PT at discharge./    Discharge Diagnoses:  Principal Problem:   COVID-19 virus infection Active Problems:   GERD (gastroesophageal reflux disease)   ESRD on dialysis (HCC)   Chronic systolic CHF (congestive heart failure) (HCC)   HLD (hyperlipidemia)   SOB (shortness of breath)   Acute respiratory failure with hypoxia (HCC)   Generalized weakness    Discharge Instructions  Discharge Instructions     Diet - low sodium heart healthy   Complete by: As directed    Increase activity slowly   Complete by: As directed    No wound care   Complete by: As directed       Allergies as of 08/29/2020   No Known Allergies      Medication List     TAKE these medications    acetaminophen 500 MG tablet Commonly known as: TYLENOL Take 1,000 mg by mouth 3 (three) times daily as needed for moderate pain.   allopurinol 100 MG tablet Commonly known as: ZYLOPRIM Take 1 tablet (100 mg total) by mouth daily.   atorvastatin 20 MG tablet Commonly known as: LIPITOR TAKE 1 TABLET BY MOUTH DAILY FOR  CHOLESTEROL What changed:  how much to take how to take this when to take this additional instructions   Auryxia 1 GM 210 MG(Fe) tablet Generic drug: ferric citrate Take 210 mg by mouth 4 (four) times daily.   calcium carbonate 750 MG chewable tablet Commonly known as: TUMS EX Chew 1,500 mg by mouth daily with supper.   cinacalcet 60 MG tablet Commonly known as: SENSIPAR Take 60 mg by mouth daily.   ethyl chloride spray Apply 1 application topically See admin instructions. Three times weekly at dialysis   midodrine 10 MG tablet Commonly known as: PROAMATINE Take 1  tablet (10 mg total) by mouth 3 (three) times daily. OVERDUE FOR FOLLOW UP. PLEASE CALL AND SCHEDULE - 1ST ATTEMPT What changed: additional instructions   omeprazole 20 MG capsule Commonly known as: PRILOSEC Take 20 mg by mouth daily.               Durable Medical Equipment  (From admission, onward)           Start     Ordered   08/27/20 1257  For home use only DME Walker rolling  Once       Question Answer Comment  Walker: With 5 Inch Wheels   Patient needs a walker to treat with the following condition Balance disorder      08/27/20 1256            No Known Allergies  Consultations: Nephrology    Procedures/Studies: CT HEAD WO CONTRAST (5MM)  Result Date: 08/25/2020 CLINICAL DATA:  Weakness and recent fall.  Positive for COVID. EXAM: CT HEAD WITHOUT CONTRAST TECHNIQUE: Contiguous axial images were obtained from the base of the skull through the vertex without intravenous contrast. COMPARISON:  None. FINDINGS: Brain: No evidence of acute infarction, hemorrhage, hydrocephalus, extra-axial collection or mass lesion/mass effect. Diffuse cerebral atrophy. Low-attenuation changes in the deep white matter consistent with small vessel ischemic changes. Mild ventricular dilatation consistent with central atrophy. Vascular: Moderate intracranial arterial vascular calcifications. Skull: Calvarium appears intact. Sinuses/Orbits: Paranasal sinuses and mastoid air cells are clear. Other: None. IMPRESSION: No acute intracranial abnormalities. Chronic atrophy and small vessel ischemic changes. Electronically Signed   By: Lucienne Capers M.D.   On: 08/25/2020 17:31   DG Chest Portable 1 View  Result Date: 08/25/2020 CLINICAL DATA:  COVID-19 positive, fatigue. EXAM: PORTABLE CHEST 1 VIEW COMPARISON:  November 30, 2019. FINDINGS: Stable cardiomegaly. Status post coronary bypass graft. Mild central pulmonary vascular congestion is noted. No pneumothorax is noted. Mild bibasilar  subsegmental atelectasis or edema is noted. Bony thorax is unremarkable. IMPRESSION: Stable cardiomegaly with mild central pulmonary vascular congestion. Mild bibasilar subsegmental atelectasis or edema is noted. Aortic Atherosclerosis (ICD10-I70.0). Electronically Signed   By: Marijo Conception M.D.   On: 08/25/2020 15:56     Subjective: He is feeling well, ready to go home   Discharge Exam: Vitals:   08/29/20 0754 08/29/20 1514  BP: 107/75 121/90  Pulse: 97 94  Resp: 18 18  Temp:    SpO2: 96% 97%     General: Pt is alert, awake, not in acute distress Cardiovascular: RRR, S1/S2 +, no rubs, no gallops Respiratory: CTA bilaterally, no wheezing, no rhonchi Abdominal: Soft, NT, ND, bowel sounds + Extremities: no edema, no cyanosis    The results of significant diagnostics from this hospitalization (including imaging, microbiology, ancillary and laboratory) are listed below for reference.     Microbiology: Recent Results (from the past 240 hour(s))  Resp  Panel by RT-PCR (Flu A&B, Covid) Nasopharyngeal Swab     Status: Abnormal   Collection Time: 08/25/20  3:10 PM   Specimen: Nasopharyngeal Swab; Nasopharyngeal(NP) swabs in vial transport medium  Result Value Ref Range Status   SARS Coronavirus 2 by RT PCR POSITIVE (A) NEGATIVE Final    Comment: RESULT CALLED TO, READ BACK BY AND VERIFIED WITH: kaitlin zuelta rn 08/25/2020 1618 src  (NOTE) SARS-CoV-2 target nucleic acids are DETECTED.  The SARS-CoV-2 RNA is generally detectable in upper respiratory specimens during the acute phase of infection. Positive results are indicative of the presence of the identified virus, but do not rule out bacterial infection or co-infection with other pathogens not detected by the test. Clinical correlation with patient history and other diagnostic information is necessary to determine patient infection status. The expected result is Negative.  Fact Sheet for  Patients: EntrepreneurPulse.com.au  Fact Sheet for Healthcare Providers: IncredibleEmployment.be  This test is not yet approved or cleared by the [place] FDA and  has been authorized for detection and/or diagnosis of SARS-CoV-2 by FDA under an Emergency Use Authorization (EUA).  This EUA will remain in effect (meaning this test ca n be used) for the duration of  the COVID-19 declaration under Section 564(b)(1) of the Act, 21 U.S.C. section 360bbb-3(b)(1), unless the authorization is terminated or revoked sooner.     Influenza A by PCR NEGATIVE NEGATIVE Final   Influenza B by PCR NEGATIVE NEGATIVE Final    Comment: (NOTE) The Xpert Xpress SARS-CoV-2/FLU/RSV plus assay is intended as an aid in the diagnosis of influenza from Nasopharyngeal swab specimens and should not be used as a sole basis for treatment. Nasal washings and aspirates are unacceptable for Xpert Xpress SARS-CoV-2/FLU/RSV testing.  Fact Sheet for Patients: EntrepreneurPulse.com.au  Fact Sheet for Healthcare Providers: IncredibleEmployment.be  This test is not yet approved or cleared by the [place] FDA and has been authorized for detection and/or diagnosis of SARS-CoV-2 by FDA under an Emergency Use Authorization (EUA). This EUA will remain in effect (meaning this test can be used) for the duration of the COVID-19 declaration under Section 564(b)(1) of the Act, 21 U.S.C. section 360bbb-3(b)(1), unless the authorization is terminated or revoked.  Performed at [facility], [address] [place] [address]      Labs: BNP (last 3 results) Recent Labs    07/11/20 1051 08/25/20 2057  BNP >4,200* >0,630.1*   Basic Metabolic Panel: Recent Labs  Lab 08/25/20 1510 08/26/20 0203 08/27/20 0318 08/28/20 0348 08/29/20 0934  NA 137 134* 134* 133* 134*  K 5.3* 6.0* 3.7 5.8* 4.4  CL 91* 93* 95* 92*  93*  CO2 26 18* 23 21* 23  GLUCOSE 165* 195* 112* 144* 325*  BUN 71* 82* 53* 77* 68*  CREATININE 7.09* 7.91* 4.84* 6.89* 5.72*  CALCIUM 10.1 9.4 8.9 8.8* 9.1  MG  --  1.7  --   --   --   PHOS  --  6.1*  --  6.7*  --    Liver Function Tests: Recent Labs  Lab 08/26/20 0203 08/28/20 0348  AST 38  --   ALT 28  --   ALKPHOS 106  --   BILITOT 0.9  --   PROT 7.5  --   ALBUMIN 3.7 3.5   No results for input(s): LIPASE, AMYLASE in the last 168 hours. No results for input(s): AMMONIA in the last 168 hours. CBC: Recent Labs  Lab 08/25/20 1510 08/26/20 0203  WBC  5.4 5.4  NEUTROABS  --  4.7  HGB 12.8* 12.6*  HCT 39.1 37.9*  MCV 106.5* 106.2*  PLT 151 159   Cardiac Enzymes: No results for input(s): CKTOTAL, CKMB, CKMBINDEX, TROPONINI in the last 168 hours. BNP: Invalid input(s): POCBNP CBG: Recent Labs  Lab 08/29/20 1204  GLUCAP 208*   D-Dimer No results for input(s): DDIMER in the last 72 hours. Hgb A1c No results for input(s): HGBA1C in the last 72 hours. Lipid Profile No results for input(s): CHOL, HDL, LDLCALC, TRIG, CHOLHDL, LDLDIRECT in the last 72 hours. Thyroid function studies No results for input(s): TSH, T4TOTAL, T3FREE, THYROIDAB in the last 72 hours.  Invalid input(s): FREET3 Anemia work up No results for input(s): VITAMINB12, FOLATE, FERRITIN, TIBC, IRON, RETICCTPCT in the last 72 hours. Urinalysis    Component Value Date/Time   COLORURINE YELLOW 11/01/2018 1130   APPEARANCEUR HAZY (A) 11/01/2018 1130   LABSPEC 1.014 11/01/2018 1130   PHURINE 9.0 (H) 11/01/2018 1130   GLUCOSEU 150 (A) 11/01/2018 1130   HGBUR SMALL (A) 11/01/2018 1130   BILIRUBINUR NEGATIVE 11/01/2018 1130   KETONESUR NEGATIVE 11/01/2018 1130   PROTEINUR 100 (A) 11/01/2018 1130   NITRITE NEGATIVE 11/01/2018 1130   LEUKOCYTESUR NEGATIVE 11/01/2018 1130   Sepsis Labs Invalid input(s): PROCALCITONIN,  WBC,  LACTICIDVEN Microbiology Recent Results (from the past 240 hour(s))   Resp Panel by RT-PCR (Flu A&B, Covid) Nasopharyngeal Swab     Status: Abnormal   Collection Time: 08/25/20  3:10 PM   Specimen: Nasopharyngeal Swab; Nasopharyngeal(NP) swabs in vial transport medium  Result Value Ref Range Status   SARS Coronavirus 2 by RT PCR POSITIVE (A) NEGATIVE Final    Comment: RESULT CALLED TO, READ BACK BY AND VERIFIED WITH: kaitlin zuelta rn 08/25/2020 1618 src  (NOTE) SARS-CoV-2 target nucleic acids are DETECTED.  The SARS-CoV-2 RNA is generally detectable in upper respiratory specimens during the acute phase of infection. Positive results are indicative of the presence of the identified virus, but do not rule out bacterial infection or co-infection with other pathogens not detected by the test. Clinical correlation with patient history and other diagnostic information is necessary to determine patient infection status. The expected result is Negative.  Fact Sheet for Patients: EntrepreneurPulse.com.au  Fact Sheet for Healthcare Providers: IncredibleEmployment.be  This test is not yet approved or cleared by the [place] FDA and  has been authorized for detection and/or diagnosis of SARS-CoV-2 by FDA under an Emergency Use Authorization (EUA).  This EUA will remain in effect (meaning this test ca n be used) for the duration of  the COVID-19 declaration under Section 564(b)(1) of the Act, 21 U.S.C. section 360bbb-3(b)(1), unless the authorization is terminated or revoked sooner.     Influenza A by PCR NEGATIVE NEGATIVE Final   Influenza B by PCR NEGATIVE NEGATIVE Final    Comment: (NOTE) The Xpert Xpress SARS-CoV-2/FLU/RSV plus assay is intended as an aid in the diagnosis of influenza from Nasopharyngeal swab specimens and should not be used as a sole basis for treatment. Nasal washings and aspirates are unacceptable for Xpert Xpress SARS-CoV-2/FLU/RSV testing.  Fact Sheet for  Patients: EntrepreneurPulse.com.au  Fact Sheet for Healthcare Providers: IncredibleEmployment.be  This test is not yet approved or cleared by the [place] FDA and has been authorized for detection and/or diagnosis of SARS-CoV-2 by FDA under an Emergency Use Authorization (EUA). This EUA will remain in effect (meaning this test can be used) for the duration of the COVID-19 declaration under Section 564(b)(1)  of the Act, 21 U.S.C. section 360bbb-3(b)(1), unless the authorization is terminated or revoked.  Performed at [facility], [address] [place], [place] [address]      Time coordinating discharge: 40 minutes  SIGNED:   Elmarie Shiley, MD  [facility]

## 2020-08-30 ENCOUNTER — Telehealth: Payer: Self-pay | Admitting: Nephrology

## 2020-08-30 DIAGNOSIS — Z992 Dependence on renal dialysis: Secondary | ICD-10-CM | POA: Diagnosis not present

## 2020-08-30 DIAGNOSIS — N184 Chronic kidney disease, stage 4 (severe): Secondary | ICD-10-CM | POA: Diagnosis not present

## 2020-08-30 DIAGNOSIS — N2581 Secondary hyperparathyroidism of renal origin: Secondary | ICD-10-CM | POA: Diagnosis not present

## 2020-08-30 DIAGNOSIS — N186 End stage renal disease: Secondary | ICD-10-CM | POA: Diagnosis not present

## 2020-08-30 DIAGNOSIS — D631 Anemia in chronic kidney disease: Secondary | ICD-10-CM | POA: Diagnosis not present

## 2020-08-30 NOTE — Telephone Encounter (Signed)
Transition of Care Contact from [place]  Date of Discharge: 08/29/20 Date of Contact: 08/30/20 Method of contact: phone - attempted  Attempted to contact patient to discuss transition of care from inpatient admission.  Patient answered the phone and hung up.  Will attempt to call again to discussed recent admission and TOC.  Jen Mow, PA-C [facility] Pager: [phone]

## 2020-09-01 ENCOUNTER — Telehealth: Payer: Self-pay | Admitting: *Deleted

## 2020-09-01 NOTE — Telephone Encounter (Signed)
Transition Care Management Unsuccessful Follow-up Telephone Call  Date of discharge and from where:  08/29/2020 [place]  Attempts:  1st Attempt  Reason for unsuccessful TCM follow-up call:  Left voice message

## 2020-09-02 DIAGNOSIS — Z992 Dependence on renal dialysis: Secondary | ICD-10-CM | POA: Diagnosis not present

## 2020-09-02 DIAGNOSIS — R0602 Shortness of breath: Secondary | ICD-10-CM | POA: Diagnosis not present

## 2020-09-02 DIAGNOSIS — N186 End stage renal disease: Secondary | ICD-10-CM | POA: Diagnosis not present

## 2020-09-02 DIAGNOSIS — R509 Fever, unspecified: Secondary | ICD-10-CM | POA: Diagnosis not present

## 2020-09-02 DIAGNOSIS — D631 Anemia in chronic kidney disease: Secondary | ICD-10-CM | POA: Diagnosis not present

## 2020-09-02 DIAGNOSIS — N184 Chronic kidney disease, stage 4 (severe): Secondary | ICD-10-CM | POA: Diagnosis not present

## 2020-09-02 DIAGNOSIS — N2581 Secondary hyperparathyroidism of renal origin: Secondary | ICD-10-CM | POA: Diagnosis not present

## 2020-09-02 LAB — METHYLMALONIC ACID, SERUM: Methylmalonic Acid, Quantitative: 1267 nmol/L — ABNORMAL HIGH (ref 0–378)

## 2020-09-02 NOTE — Telephone Encounter (Signed)
Transition Care Management Unsuccessful Follow-up Telephone Call  Date of discharge and from where:  08/29/2020 [place]  Attempts:  2nd Attempt  Reason for unsuccessful TCM follow-up call:  Left voice message

## 2020-09-03 NOTE — Telephone Encounter (Signed)
Transition Care Management Unsuccessful Follow-up Telephone Call  Date of discharge and from where:  08/29/2020 [place]  Attempts:  3rd Attempt  Reason for unsuccessful TCM follow-up call:  Left voice message

## 2020-09-04 DIAGNOSIS — N184 Chronic kidney disease, stage 4 (severe): Secondary | ICD-10-CM | POA: Diagnosis not present

## 2020-09-04 DIAGNOSIS — N2889 Other specified disorders of kidney and ureter: Secondary | ICD-10-CM | POA: Diagnosis not present

## 2020-09-04 DIAGNOSIS — N186 End stage renal disease: Secondary | ICD-10-CM | POA: Diagnosis not present

## 2020-09-04 DIAGNOSIS — Z992 Dependence on renal dialysis: Secondary | ICD-10-CM | POA: Diagnosis not present

## 2020-09-04 DIAGNOSIS — D631 Anemia in chronic kidney disease: Secondary | ICD-10-CM | POA: Diagnosis not present

## 2020-09-04 DIAGNOSIS — U071 COVID-19: Secondary | ICD-10-CM | POA: Diagnosis not present

## 2020-09-04 DIAGNOSIS — N2581 Secondary hyperparathyroidism of renal origin: Secondary | ICD-10-CM | POA: Diagnosis not present

## 2020-09-06 DIAGNOSIS — Z992 Dependence on renal dialysis: Secondary | ICD-10-CM | POA: Diagnosis not present

## 2020-09-06 DIAGNOSIS — N2581 Secondary hyperparathyroidism of renal origin: Secondary | ICD-10-CM | POA: Diagnosis not present

## 2020-09-06 DIAGNOSIS — N186 End stage renal disease: Secondary | ICD-10-CM | POA: Diagnosis not present

## 2020-09-06 DIAGNOSIS — N184 Chronic kidney disease, stage 4 (severe): Secondary | ICD-10-CM | POA: Diagnosis not present

## 2020-09-06 DIAGNOSIS — D631 Anemia in chronic kidney disease: Secondary | ICD-10-CM | POA: Diagnosis not present

## 2020-09-09 DIAGNOSIS — N2581 Secondary hyperparathyroidism of renal origin: Secondary | ICD-10-CM | POA: Diagnosis not present

## 2020-09-09 DIAGNOSIS — Z992 Dependence on renal dialysis: Secondary | ICD-10-CM | POA: Diagnosis not present

## 2020-09-09 DIAGNOSIS — N184 Chronic kidney disease, stage 4 (severe): Secondary | ICD-10-CM | POA: Diagnosis not present

## 2020-09-09 DIAGNOSIS — N186 End stage renal disease: Secondary | ICD-10-CM | POA: Diagnosis not present

## 2020-09-09 DIAGNOSIS — D631 Anemia in chronic kidney disease: Secondary | ICD-10-CM | POA: Diagnosis not present

## 2020-09-10 ENCOUNTER — Other Ambulatory Visit: Payer: Self-pay

## 2020-09-10 ENCOUNTER — Encounter: Payer: Medicare Other | Admitting: Internal Medicine

## 2020-09-10 ENCOUNTER — Encounter: Payer: Self-pay | Admitting: Internal Medicine

## 2020-09-10 ENCOUNTER — Telehealth: Payer: Self-pay

## 2020-09-10 NOTE — Telephone Encounter (Signed)
Called patient. Msg left to return call to reschedule appointment.

## 2020-09-11 DIAGNOSIS — Z992 Dependence on renal dialysis: Secondary | ICD-10-CM | POA: Diagnosis not present

## 2020-09-11 DIAGNOSIS — N186 End stage renal disease: Secondary | ICD-10-CM | POA: Diagnosis not present

## 2020-09-11 DIAGNOSIS — D631 Anemia in chronic kidney disease: Secondary | ICD-10-CM | POA: Diagnosis not present

## 2020-09-11 DIAGNOSIS — N184 Chronic kidney disease, stage 4 (severe): Secondary | ICD-10-CM | POA: Diagnosis not present

## 2020-09-11 DIAGNOSIS — N2581 Secondary hyperparathyroidism of renal origin: Secondary | ICD-10-CM | POA: Diagnosis not present

## 2020-09-12 NOTE — Progress Notes (Signed)
Error

## 2020-09-13 DIAGNOSIS — Z992 Dependence on renal dialysis: Secondary | ICD-10-CM | POA: Diagnosis not present

## 2020-09-13 DIAGNOSIS — N184 Chronic kidney disease, stage 4 (severe): Secondary | ICD-10-CM | POA: Diagnosis not present

## 2020-09-13 DIAGNOSIS — D631 Anemia in chronic kidney disease: Secondary | ICD-10-CM | POA: Diagnosis not present

## 2020-09-13 DIAGNOSIS — N186 End stage renal disease: Secondary | ICD-10-CM | POA: Diagnosis not present

## 2020-09-13 DIAGNOSIS — N2581 Secondary hyperparathyroidism of renal origin: Secondary | ICD-10-CM | POA: Diagnosis not present

## 2020-09-16 DIAGNOSIS — D631 Anemia in chronic kidney disease: Secondary | ICD-10-CM | POA: Diagnosis not present

## 2020-09-16 DIAGNOSIS — Z992 Dependence on renal dialysis: Secondary | ICD-10-CM | POA: Diagnosis not present

## 2020-09-16 DIAGNOSIS — U071 COVID-19: Secondary | ICD-10-CM | POA: Diagnosis not present

## 2020-09-16 DIAGNOSIS — N184 Chronic kidney disease, stage 4 (severe): Secondary | ICD-10-CM | POA: Diagnosis not present

## 2020-09-16 DIAGNOSIS — N2581 Secondary hyperparathyroidism of renal origin: Secondary | ICD-10-CM | POA: Diagnosis not present

## 2020-09-16 DIAGNOSIS — N186 End stage renal disease: Secondary | ICD-10-CM | POA: Diagnosis not present

## 2020-09-16 NOTE — Telephone Encounter (Signed)
Called patient to get on the schedule. Patient unable to hear while at dialysis apnt. He would prefer a call back this afternoon.

## 2020-09-18 DIAGNOSIS — N186 End stage renal disease: Secondary | ICD-10-CM | POA: Diagnosis not present

## 2020-09-18 DIAGNOSIS — N184 Chronic kidney disease, stage 4 (severe): Secondary | ICD-10-CM | POA: Diagnosis not present

## 2020-09-18 DIAGNOSIS — Z992 Dependence on renal dialysis: Secondary | ICD-10-CM | POA: Diagnosis not present

## 2020-09-18 DIAGNOSIS — N2581 Secondary hyperparathyroidism of renal origin: Secondary | ICD-10-CM | POA: Diagnosis not present

## 2020-09-18 DIAGNOSIS — D631 Anemia in chronic kidney disease: Secondary | ICD-10-CM | POA: Diagnosis not present

## 2020-09-18 NOTE — Telephone Encounter (Signed)
Patient in dialysis, will try again later.

## 2020-09-19 NOTE — Telephone Encounter (Signed)
Discussed with the patient. He declined rescheduling his and his wife's appointments and states he will call back when he is ready to reschedule visits.

## 2020-09-20 DIAGNOSIS — N186 End stage renal disease: Secondary | ICD-10-CM | POA: Diagnosis not present

## 2020-09-20 DIAGNOSIS — Z992 Dependence on renal dialysis: Secondary | ICD-10-CM | POA: Diagnosis not present

## 2020-09-20 DIAGNOSIS — N2581 Secondary hyperparathyroidism of renal origin: Secondary | ICD-10-CM | POA: Diagnosis not present

## 2020-09-20 DIAGNOSIS — N184 Chronic kidney disease, stage 4 (severe): Secondary | ICD-10-CM | POA: Diagnosis not present

## 2020-09-20 DIAGNOSIS — D631 Anemia in chronic kidney disease: Secondary | ICD-10-CM | POA: Diagnosis not present

## 2020-09-23 DIAGNOSIS — N184 Chronic kidney disease, stage 4 (severe): Secondary | ICD-10-CM | POA: Diagnosis not present

## 2020-09-23 DIAGNOSIS — Z992 Dependence on renal dialysis: Secondary | ICD-10-CM | POA: Diagnosis not present

## 2020-09-23 DIAGNOSIS — N2581 Secondary hyperparathyroidism of renal origin: Secondary | ICD-10-CM | POA: Diagnosis not present

## 2020-09-23 DIAGNOSIS — D631 Anemia in chronic kidney disease: Secondary | ICD-10-CM | POA: Diagnosis not present

## 2020-09-23 DIAGNOSIS — N186 End stage renal disease: Secondary | ICD-10-CM | POA: Diagnosis not present

## 2020-09-25 DIAGNOSIS — Z9981 Dependence on supplemental oxygen: Secondary | ICD-10-CM | POA: Diagnosis not present

## 2020-09-25 DIAGNOSIS — K219 Gastro-esophageal reflux disease without esophagitis: Secondary | ICD-10-CM | POA: Diagnosis not present

## 2020-09-25 DIAGNOSIS — I509 Heart failure, unspecified: Secondary | ICD-10-CM | POA: Diagnosis not present

## 2020-09-25 DIAGNOSIS — M109 Gout, unspecified: Secondary | ICD-10-CM | POA: Diagnosis not present

## 2020-09-25 DIAGNOSIS — Z95828 Presence of other vascular implants and grafts: Secondary | ICD-10-CM | POA: Diagnosis not present

## 2020-09-25 DIAGNOSIS — G3184 Mild cognitive impairment, so stated: Secondary | ICD-10-CM | POA: Diagnosis not present

## 2020-09-25 DIAGNOSIS — N186 End stage renal disease: Secondary | ICD-10-CM | POA: Diagnosis not present

## 2020-09-25 DIAGNOSIS — I25110 Atherosclerotic heart disease of native coronary artery with unstable angina pectoris: Secondary | ICD-10-CM | POA: Diagnosis not present

## 2020-09-25 DIAGNOSIS — E785 Hyperlipidemia, unspecified: Secondary | ICD-10-CM | POA: Diagnosis not present

## 2020-09-25 DIAGNOSIS — Z8616 Personal history of COVID-19: Secondary | ICD-10-CM | POA: Diagnosis not present

## 2020-09-25 DIAGNOSIS — I132 Hypertensive heart and chronic kidney disease with heart failure and with stage 5 chronic kidney disease, or end stage renal disease: Secondary | ICD-10-CM | POA: Diagnosis not present

## 2020-09-26 DIAGNOSIS — I132 Hypertensive heart and chronic kidney disease with heart failure and with stage 5 chronic kidney disease, or end stage renal disease: Secondary | ICD-10-CM | POA: Diagnosis not present

## 2020-09-26 DIAGNOSIS — I509 Heart failure, unspecified: Secondary | ICD-10-CM | POA: Diagnosis not present

## 2020-09-26 DIAGNOSIS — I25110 Atherosclerotic heart disease of native coronary artery with unstable angina pectoris: Secondary | ICD-10-CM | POA: Diagnosis not present

## 2020-09-26 DIAGNOSIS — E785 Hyperlipidemia, unspecified: Secondary | ICD-10-CM | POA: Diagnosis not present

## 2020-09-26 DIAGNOSIS — Z8616 Personal history of COVID-19: Secondary | ICD-10-CM | POA: Diagnosis not present

## 2020-09-26 DIAGNOSIS — N186 End stage renal disease: Secondary | ICD-10-CM | POA: Diagnosis not present

## 2020-09-27 DIAGNOSIS — Z8616 Personal history of COVID-19: Secondary | ICD-10-CM | POA: Diagnosis not present

## 2020-09-27 DIAGNOSIS — I132 Hypertensive heart and chronic kidney disease with heart failure and with stage 5 chronic kidney disease, or end stage renal disease: Secondary | ICD-10-CM | POA: Diagnosis not present

## 2020-09-27 DIAGNOSIS — I509 Heart failure, unspecified: Secondary | ICD-10-CM | POA: Diagnosis not present

## 2020-09-27 DIAGNOSIS — N186 End stage renal disease: Secondary | ICD-10-CM | POA: Diagnosis not present

## 2020-09-27 DIAGNOSIS — I25110 Atherosclerotic heart disease of native coronary artery with unstable angina pectoris: Secondary | ICD-10-CM | POA: Diagnosis not present

## 2020-09-27 DIAGNOSIS — E785 Hyperlipidemia, unspecified: Secondary | ICD-10-CM | POA: Diagnosis not present

## 2020-09-28 DIAGNOSIS — E785 Hyperlipidemia, unspecified: Secondary | ICD-10-CM | POA: Diagnosis not present

## 2020-09-28 DIAGNOSIS — I25110 Atherosclerotic heart disease of native coronary artery with unstable angina pectoris: Secondary | ICD-10-CM | POA: Diagnosis not present

## 2020-09-28 DIAGNOSIS — I509 Heart failure, unspecified: Secondary | ICD-10-CM | POA: Diagnosis not present

## 2020-09-28 DIAGNOSIS — Z8616 Personal history of COVID-19: Secondary | ICD-10-CM | POA: Diagnosis not present

## 2020-09-28 DIAGNOSIS — I132 Hypertensive heart and chronic kidney disease with heart failure and with stage 5 chronic kidney disease, or end stage renal disease: Secondary | ICD-10-CM | POA: Diagnosis not present

## 2020-09-28 DIAGNOSIS — N186 End stage renal disease: Secondary | ICD-10-CM | POA: Diagnosis not present

## 2020-09-29 DIAGNOSIS — E785 Hyperlipidemia, unspecified: Secondary | ICD-10-CM | POA: Diagnosis not present

## 2020-09-29 DIAGNOSIS — I25110 Atherosclerotic heart disease of native coronary artery with unstable angina pectoris: Secondary | ICD-10-CM | POA: Diagnosis not present

## 2020-09-29 DIAGNOSIS — I132 Hypertensive heart and chronic kidney disease with heart failure and with stage 5 chronic kidney disease, or end stage renal disease: Secondary | ICD-10-CM | POA: Diagnosis not present

## 2020-09-29 DIAGNOSIS — N186 End stage renal disease: Secondary | ICD-10-CM | POA: Diagnosis not present

## 2020-09-29 DIAGNOSIS — Z8616 Personal history of COVID-19: Secondary | ICD-10-CM | POA: Diagnosis not present

## 2020-09-29 DIAGNOSIS — I509 Heart failure, unspecified: Secondary | ICD-10-CM | POA: Diagnosis not present

## 2020-09-30 DIAGNOSIS — E785 Hyperlipidemia, unspecified: Secondary | ICD-10-CM | POA: Diagnosis not present

## 2020-09-30 DIAGNOSIS — I509 Heart failure, unspecified: Secondary | ICD-10-CM | POA: Diagnosis not present

## 2020-09-30 DIAGNOSIS — N186 End stage renal disease: Secondary | ICD-10-CM | POA: Diagnosis not present

## 2020-09-30 DIAGNOSIS — I132 Hypertensive heart and chronic kidney disease with heart failure and with stage 5 chronic kidney disease, or end stage renal disease: Secondary | ICD-10-CM | POA: Diagnosis not present

## 2020-09-30 DIAGNOSIS — Z8616 Personal history of COVID-19: Secondary | ICD-10-CM | POA: Diagnosis not present

## 2020-09-30 DIAGNOSIS — I25110 Atherosclerotic heart disease of native coronary artery with unstable angina pectoris: Secondary | ICD-10-CM | POA: Diagnosis not present

## 2020-10-04 DEATH — deceased

## 2022-09-10 ENCOUNTER — Other Ambulatory Visit (HOSPITAL_COMMUNITY): Payer: Self-pay

## 2023-06-18 ENCOUNTER — Other Ambulatory Visit (HOSPITAL_COMMUNITY): Payer: Self-pay
